# Patient Record
Sex: Female | Born: 1960 | Race: White | Hispanic: No | Smoking: Former smoker
Health system: Southern US, Community
[De-identification: ages and names within clinical notes are randomized; demographics above are authoritative.]

## PROBLEM LIST (undated history)

## (undated) DIAGNOSIS — I739 Peripheral vascular disease, unspecified: Secondary | ICD-10-CM

## (undated) DIAGNOSIS — S329XXS Fracture of unspecified parts of lumbosacral spine and pelvis, sequela: Secondary | ICD-10-CM

## (undated) DIAGNOSIS — M4696 Unspecified inflammatory spondylopathy, lumbar region: Secondary | ICD-10-CM

## (undated) DIAGNOSIS — Q780 Osteogenesis imperfecta: Secondary | ICD-10-CM

## (undated) DIAGNOSIS — G9341 Metabolic encephalopathy: Secondary | ICD-10-CM

## (undated) DIAGNOSIS — E785 Hyperlipidemia, unspecified: Secondary | ICD-10-CM

## (undated) DIAGNOSIS — I639 Cerebral infarction, unspecified: Secondary | ICD-10-CM

## (undated) DIAGNOSIS — D65 Disseminated intravascular coagulation [defibrination syndrome]: Secondary | ICD-10-CM

## (undated) DIAGNOSIS — F4000 Agoraphobia, unspecified: Secondary | ICD-10-CM

## (undated) DIAGNOSIS — F319 Bipolar disorder, unspecified: Secondary | ICD-10-CM

## (undated) DIAGNOSIS — M810 Age-related osteoporosis without current pathological fracture: Secondary | ICD-10-CM

## (undated) DIAGNOSIS — F609 Personality disorder, unspecified: Secondary | ICD-10-CM

## (undated) DIAGNOSIS — G40909 Epilepsy, unspecified, not intractable, without status epilepticus: Secondary | ICD-10-CM

## (undated) DIAGNOSIS — Z9181 History of falling: Secondary | ICD-10-CM

## (undated) DIAGNOSIS — M51369 Other intervertebral disc degeneration, lumbar region without mention of lumbar back pain or lower extremity pain: Secondary | ICD-10-CM

## (undated) DIAGNOSIS — I10 Essential (primary) hypertension: Secondary | ICD-10-CM

## (undated) DIAGNOSIS — M1990 Unspecified osteoarthritis, unspecified site: Secondary | ICD-10-CM

## (undated) HISTORY — PX: ABDOMINAL HYSTERECTOMY: SHX81

## (undated) HISTORY — DX: Metabolic encephalopathy: G93.41

## (undated) HISTORY — DX: Disseminated intravascular coagulation (defibrination syndrome): D65

## (undated) HISTORY — DX: Agoraphobia, unspecified: F40.00

## (undated) HISTORY — DX: Personality disorder, unspecified: F60.9

## (undated) HISTORY — DX: History of falling: Z91.81

## (undated) HISTORY — DX: Unspecified inflammatory spondylopathy, lumbar region: M46.96

## (undated) HISTORY — DX: Epilepsy, unspecified, not intractable, without status epilepticus: G40.909

## (undated) HISTORY — DX: Fracture of unspecified parts of lumbosacral spine and pelvis, sequela: S32.9XXS

## (undated) HISTORY — DX: Other intervertebral disc degeneration, lumbar region without mention of lumbar back pain or lower extremity pain: M51.369

## (undated) HISTORY — DX: Peripheral vascular disease, unspecified: I73.9

---

## 1990-05-27 DIAGNOSIS — M810 Age-related osteoporosis without current pathological fracture: Secondary | ICD-10-CM | POA: Insufficient documentation

## 2000-09-25 ENCOUNTER — Emergency Department (HOSPITAL_COMMUNITY): Admission: EM | Admit: 2000-09-25 | Discharge: 2000-09-25 | Payer: Self-pay | Admitting: *Deleted

## 2000-12-04 ENCOUNTER — Other Ambulatory Visit: Admission: RE | Admit: 2000-12-04 | Discharge: 2000-12-04 | Payer: Self-pay

## 2001-09-29 ENCOUNTER — Emergency Department (HOSPITAL_COMMUNITY): Admission: EM | Admit: 2001-09-29 | Discharge: 2001-09-29 | Payer: Self-pay | Admitting: Emergency Medicine

## 2001-09-29 ENCOUNTER — Encounter: Payer: Self-pay | Admitting: Emergency Medicine

## 2001-11-18 ENCOUNTER — Emergency Department (HOSPITAL_COMMUNITY): Admission: EM | Admit: 2001-11-18 | Discharge: 2001-11-19 | Payer: Self-pay | Admitting: Emergency Medicine

## 2001-11-19 ENCOUNTER — Encounter: Payer: Self-pay | Admitting: Emergency Medicine

## 2004-03-03 ENCOUNTER — Encounter: Admission: RE | Admit: 2004-03-03 | Discharge: 2004-03-03 | Payer: Self-pay | Admitting: Occupational Medicine

## 2015-09-25 ENCOUNTER — Emergency Department (HOSPITAL_COMMUNITY): Payer: BLUE CROSS/BLUE SHIELD

## 2015-09-25 ENCOUNTER — Encounter (HOSPITAL_COMMUNITY): Payer: Self-pay | Admitting: Emergency Medicine

## 2015-09-25 ENCOUNTER — Inpatient Hospital Stay (HOSPITAL_COMMUNITY)
Admission: EM | Admit: 2015-09-25 | Discharge: 2015-09-29 | DRG: 470 | Disposition: A | Discharge status: Skilled Nursing Facility | Payer: BLUE CROSS/BLUE SHIELD | Attending: Internal Medicine | Admitting: Internal Medicine

## 2015-09-25 DIAGNOSIS — Z885 Allergy status to narcotic agent status: Secondary | ICD-10-CM

## 2015-09-25 DIAGNOSIS — E785 Hyperlipidemia, unspecified: Secondary | ICD-10-CM | POA: Diagnosis present

## 2015-09-25 DIAGNOSIS — Z87311 Personal history of (healed) other pathological fracture: Secondary | ICD-10-CM

## 2015-09-25 DIAGNOSIS — F10229 Alcohol dependence with intoxication, unspecified: Secondary | ICD-10-CM | POA: Diagnosis present

## 2015-09-25 DIAGNOSIS — S72009A Fracture of unspecified part of neck of unspecified femur, initial encounter for closed fracture: Secondary | ICD-10-CM | POA: Diagnosis present

## 2015-09-25 DIAGNOSIS — I10 Essential (primary) hypertension: Secondary | ICD-10-CM

## 2015-09-25 DIAGNOSIS — Z8279 Family history of other congenital malformations, deformations and chromosomal abnormalities: Secondary | ICD-10-CM

## 2015-09-25 DIAGNOSIS — W010XXA Fall on same level from slipping, tripping and stumbling without subsequent striking against object, initial encounter: Secondary | ICD-10-CM | POA: Insufficient documentation

## 2015-09-25 DIAGNOSIS — M62838 Other muscle spasm: Secondary | ICD-10-CM | POA: Diagnosis present

## 2015-09-25 DIAGNOSIS — Y92002 Bathroom of unspecified non-institutional (private) residence single-family (private) house as the place of occurrence of the external cause: Secondary | ICD-10-CM | POA: Diagnosis not present

## 2015-09-25 DIAGNOSIS — S72002A Fracture of unspecified part of neck of left femur, initial encounter for closed fracture: Secondary | ICD-10-CM | POA: Diagnosis present

## 2015-09-25 DIAGNOSIS — Q780 Osteogenesis imperfecta: Secondary | ICD-10-CM

## 2015-09-25 DIAGNOSIS — Z90710 Acquired absence of both cervix and uterus: Secondary | ICD-10-CM

## 2015-09-25 DIAGNOSIS — F17200 Nicotine dependence, unspecified, uncomplicated: Secondary | ICD-10-CM | POA: Diagnosis present

## 2015-09-25 DIAGNOSIS — Z9889 Other specified postprocedural states: Secondary | ICD-10-CM

## 2015-09-25 DIAGNOSIS — M25552 Pain in left hip: Secondary | ICD-10-CM | POA: Diagnosis present

## 2015-09-25 DIAGNOSIS — F17210 Nicotine dependence, cigarettes, uncomplicated: Secondary | ICD-10-CM | POA: Diagnosis present

## 2015-09-25 HISTORY — DX: Essential (primary) hypertension: I10

## 2015-09-25 HISTORY — DX: Hyperlipidemia, unspecified: E78.5

## 2015-09-25 LAB — CBC
HEMATOCRIT: 46.8 % — AB (ref 36.0–46.0)
Hemoglobin: 15.2 g/dL — ABNORMAL HIGH (ref 12.0–15.0)
MCH: 33.4 pg (ref 26.0–34.0)
MCHC: 32.5 g/dL (ref 30.0–36.0)
MCV: 102.9 fL — AB (ref 78.0–100.0)
Platelets: 161 10*3/uL (ref 150–400)
RBC: 4.55 MIL/uL (ref 3.87–5.11)
RDW: 12.3 % (ref 11.5–15.5)
WBC: 7.9 10*3/uL (ref 4.0–10.5)

## 2015-09-25 LAB — BASIC METABOLIC PANEL
Anion gap: 10 (ref 5–15)
BUN: 6 mg/dL (ref 6–20)
CALCIUM: 9 mg/dL (ref 8.9–10.3)
CO2: 24 mmol/L (ref 22–32)
CREATININE: 0.67 mg/dL (ref 0.44–1.00)
Chloride: 106 mmol/L (ref 101–111)
GFR calc Af Amer: >60 mL/min (ref >60)
GFR calc non Af Amer: >60 mL/min (ref >60)
GLUCOSE: 140 mg/dL — AB (ref 65–99)
Potassium: 4 mmol/L (ref 3.5–5.1)
Sodium: 140 mmol/L (ref 135–145)

## 2015-09-25 LAB — RAPID URINE DRUG SCREEN, HOSP PERFORMED
AMPHETAMINES: NOT DETECTED
Barbiturates: NOT DETECTED
Benzodiazepines: NOT DETECTED
COCAINE: NOT DETECTED
OPIATES: NOT DETECTED
TETRAHYDROCANNABINOL: NOT DETECTED

## 2015-09-25 LAB — URINALYSIS, ROUTINE W REFLEX MICROSCOPIC
BILIRUBIN URINE: NEGATIVE
GLUCOSE, UA: NEGATIVE mg/dL
HGB URINE DIPSTICK: NEGATIVE
KETONES UR: NEGATIVE mg/dL
Leukocytes, UA: NEGATIVE
Nitrite: NEGATIVE
PH: 5.5 (ref 5.0–8.0)
Protein, ur: NEGATIVE mg/dL
Specific Gravity, Urine: 1.015 (ref 1.005–1.030)

## 2015-09-25 LAB — PROTIME-INR
INR: 0.95 (ref 0.00–1.49)
Prothrombin Time: 12.9 seconds (ref 11.6–15.2)

## 2015-09-25 LAB — TYPE AND SCREEN
ABO/RH(D): O POS
Antibody Screen: NEGATIVE

## 2015-09-25 LAB — ABO/RH: ABO/RH(D): O POS

## 2015-09-25 LAB — SURGICAL PCR SCREEN
MRSA, PCR: NEGATIVE
STAPHYLOCOCCUS AUREUS: NEGATIVE

## 2015-09-25 LAB — ETHANOL: ALCOHOL ETHYL (B): 54 mg/dL — AB (ref <5)

## 2015-09-25 LAB — MAGNESIUM: Magnesium: 1.5 mg/dL — ABNORMAL LOW (ref 1.7–2.4)

## 2015-09-25 MED ORDER — CHLORHEXIDINE GLUCONATE 4 % EX LIQD
60.0000 mL | Freq: Once | CUTANEOUS | Status: AC
  Administered 2015-09-26: 4 via TOPICAL
  Filled 2015-09-25 (×2): qty 60

## 2015-09-25 MED ORDER — POVIDONE-IODINE 10 % EX SWAB: 2.0000 "application " | Freq: Once | CUTANEOUS | Status: DC

## 2015-09-25 MED ORDER — SODIUM CHLORIDE 0.9 % IV SOLN: 250.0000 mL | INTRAVENOUS | Status: DC | PRN

## 2015-09-25 MED ORDER — THIAMINE HCL 100 MG/ML IJ SOLN: 100.0000 mg | Freq: Every day | INTRAMUSCULAR | Status: DC

## 2015-09-25 MED ORDER — SODIUM CHLORIDE 0.9% FLUSH: 3.0000 mL | INTRAVENOUS | Status: DC | PRN

## 2015-09-25 MED ORDER — KETOROLAC TROMETHAMINE 15 MG/ML IJ SOLN
15.0000 mg | Freq: Four times a day (QID) | INTRAMUSCULAR | Status: DC
  Administered 2015-09-25 – 2015-09-29 (×15): 15 mg via INTRAVENOUS
  Filled 2015-09-25 (×17): qty 1

## 2015-09-25 MED ORDER — CEFAZOLIN SODIUM-DEXTROSE 2-4 GM/100ML-% IV SOLN: 2.0000 g | INTRAVENOUS | Status: DC

## 2015-09-25 MED ORDER — SODIUM CHLORIDE 0.9 % IV SOLN: INTRAVENOUS | Status: DC

## 2015-09-25 MED ORDER — LORAZEPAM 2 MG/ML IJ SOLN
1.0000 mg | Freq: Four times a day (QID) | INTRAMUSCULAR | Status: AC | PRN
  Administered 2015-09-25: 1 mg via INTRAVENOUS
  Filled 2015-09-25: qty 1

## 2015-09-25 MED ORDER — ACETAMINOPHEN 500 MG PO TABS
1000.0000 mg | ORAL_TABLET | Freq: Once | ORAL | Status: AC
  Administered 2015-09-26: 1000 mg via ORAL
  Filled 2015-09-25: qty 2

## 2015-09-25 MED ORDER — ADULT MULTIVITAMIN W/MINERALS CH
1.0000 | ORAL_TABLET | Freq: Every day | ORAL | Status: DC
  Administered 2015-09-27 – 2015-09-29 (×3): 1 via ORAL
  Filled 2015-09-25 (×3): qty 1

## 2015-09-25 MED ORDER — CHLORHEXIDINE GLUCONATE 4 % EX LIQD: 60.0000 mL | Freq: Once | CUTANEOUS | Status: DC

## 2015-09-25 MED ORDER — ACETAMINOPHEN 325 MG PO TABS
650.0000 mg | ORAL_TABLET | Freq: Four times a day (QID) | ORAL | Status: DC | PRN
  Administered 2015-09-25: 650 mg via ORAL
  Filled 2015-09-25: qty 2

## 2015-09-25 MED ORDER — ACETAMINOPHEN 650 MG RE SUPP: 650.0000 mg | Freq: Four times a day (QID) | RECTAL | Status: DC | PRN

## 2015-09-25 MED ORDER — HYDROMORPHONE HCL 1 MG/ML IJ SOLN
1.0000 mg | Freq: Once | INTRAMUSCULAR | Status: AC
  Administered 2015-09-25: 1 mg via INTRAVENOUS
  Filled 2015-09-25: qty 1

## 2015-09-25 MED ORDER — SODIUM CHLORIDE 0.9% FLUSH
3.0000 mL | Freq: Two times a day (BID) | INTRAVENOUS | Status: DC
Start: 2015-09-25 — End: 2015-09-29
  Administered 2015-09-25 – 2015-09-28 (×6): 3 mL via INTRAVENOUS

## 2015-09-25 MED ORDER — METHOCARBAMOL 500 MG PO TABS
500.0000 mg | ORAL_TABLET | Freq: Three times a day (TID) | ORAL | Status: DC | PRN
  Administered 2015-09-25 – 2015-09-28 (×6): 500 mg via ORAL
  Filled 2015-09-25 (×6): qty 1

## 2015-09-25 MED ORDER — DEXTROSE-NACL 5-0.45 % IV SOLN: 100.0000 mL/h | INTRAVENOUS | Status: DC

## 2015-09-25 MED ORDER — FOLIC ACID 1 MG PO TABS
1.0000 mg | ORAL_TABLET | Freq: Every day | ORAL | Status: DC
Start: 2015-09-25 — End: 2015-09-29
  Administered 2015-09-27 – 2015-09-29 (×3): 1 mg via ORAL
  Filled 2015-09-25 (×3): qty 1

## 2015-09-25 MED ORDER — CEFAZOLIN SODIUM-DEXTROSE 2-4 GM/100ML-% IV SOLN
2.0000 g | INTRAVENOUS | Status: DC
  Filled 2015-09-25: qty 100

## 2015-09-25 MED ORDER — TRANEXAMIC ACID 1000 MG/10ML IV SOLN
1000.0000 mg | INTRAVENOUS | Status: AC
  Administered 2015-09-26: 1000 mg via INTRAVENOUS
  Filled 2015-09-25 (×2): qty 10

## 2015-09-25 MED ORDER — VITAMIN B-1 100 MG PO TABS
100.0000 mg | ORAL_TABLET | Freq: Every day | ORAL | Status: DC
  Administered 2015-09-27 – 2015-09-29 (×3): 100 mg via ORAL
  Filled 2015-09-25 (×3): qty 1

## 2015-09-25 MED ORDER — POVIDONE-IODINE 10 % EX SWAB
2.0000 "application " | Freq: Once | CUTANEOUS | Status: AC
  Administered 2015-09-26: 2 via TOPICAL

## 2015-09-25 MED ORDER — MORPHINE SULFATE (PF) 2 MG/ML IV SOLN
1.0000 mg | INTRAVENOUS | Status: DC | PRN
  Administered 2015-09-25 – 2015-09-29 (×14): 1 mg via INTRAVENOUS
  Filled 2015-09-25 (×14): qty 1

## 2015-09-25 MED ORDER — SODIUM CHLORIDE 0.9 % IV SOLN
INTRAVENOUS | Status: DC
  Administered 2015-09-25: 12:00:00 via INTRAVENOUS

## 2015-09-25 MED ORDER — ACETAMINOPHEN 500 MG PO TABS: 1000.0000 mg | ORAL_TABLET | Freq: Once | ORAL | Status: DC

## 2015-09-25 MED ORDER — PNEUMOCOCCAL VAC POLYVALENT 25 MCG/0.5ML IJ INJ: 0.5000 mL | INJECTION | INTRAMUSCULAR | Status: DC

## 2015-09-25 MED ORDER — HYDROCODONE-ACETAMINOPHEN 5-325 MG PO TABS
1.0000 | ORAL_TABLET | ORAL | Status: DC | PRN
  Administered 2015-09-25 – 2015-09-29 (×14): 2 via ORAL
  Filled 2015-09-25 (×13): qty 2

## 2015-09-25 MED ORDER — LORAZEPAM 1 MG PO TABS
1.0000 mg | ORAL_TABLET | Freq: Four times a day (QID) | ORAL | Status: AC | PRN
  Administered 2015-09-25: 1 mg via ORAL
  Filled 2015-09-25: qty 1

## 2015-09-25 NOTE — H&P (Addendum)
Date: 09/25/2015               Patient Name:  Rose Bridges MRN: [id number]  DOB: 03/11/1961 Age / Sex: 55 y.o., female   PCP: No primary care provider on file.         Medical Service: Internal Medicine Teaching Service         Attending Physician: Dr. Burns Spain, MD    First Contact: Dr. Reubin Milan Pager: [phone]  Second Contact: Dr. 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 Last night around 2 AM, after she had "a drink and a beer", she was in the bathroom when she slipped on a rug and fell on her hip. She immediately felt severe left hip pain. She did not hit her head and had full recollection of the events, but could not get up, so she fell asleep. She still the pain can get up when she woke up this morning, so she was brought in. She denies any dizziness or lightheadedness or other presyncopal symptoms prior to slipping, and denies weakness, numbness, tingling, urinary or bowel incontinence, any other pain besides the left hip and groin pain, or any chest pain, shortness of breath, nausea, vomiting, abdominal pain, or other issues at this time.   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   FHx: Osteogenesis imperfecta in her father,  paternal grandfather, daughter, and granddaughter. No other FH not previously noted.   Meds: Current Facility-Administered Medications  Medication Dose Route Frequency Provider Last Rate Last Dose  . 0.9 %  sodium chloride infusion  250 mL Intravenous PRN Lora Paula, MD      . acetaminophen (TYLENOL) tablet 650 mg  650 mg Oral Q6H PRN Lora Paula, MD       Or  . acetaminophen (TYLENOL) suppository 650 mg  650 mg Rectal Q6H PRN Lora Paula, MD      . morphine 2 MG/ML injection 1 mg  1 mg Intravenous Q2H PRN Lora Paula, MD      . sodium chloride flush (NS) 0.9 % injection 3 mL  3 mL Intravenous Q12H Lora Paula, MD      . sodium chloride flush (NS) 0.9 % injection 3 mL  3 mL Intravenous PRN Lora Paula, MD       No current outpatient prescriptions on file.    Allergies: Allergies as of 09/25/2015 - Review Complete 09/25/2015  Allergen Reaction Noted  . Codeine  09/25/2015   Past Medical History  Diagnosis Date  . Hypertension   . Hyperlipemia    Past Surgical History  Procedure Laterality Date  . Abdominal hysterectomy     No family history on file. Social History   Social History  .  Marital Status: Widowed    Spouse Name: N/A  . Number of Children: N/A  . Years of Education: N/A   Occupational History  . Not on file.   Social History Main Topics  . Smoking status: Current Every Day Smoker -- 1.00 packs/day  . Smokeless tobacco: Not on file  . Alcohol Use: 4.2 oz/week    7 Cans of beer per week  . Drug Use: No  . Sexual Activity: Not on file   Other Topics Concern  . Not on file   Social History Narrative  . No narrative on file    Review of Systems: Pertinent items noted in HPI and remainder of comprehensive ROS otherwise negative.  Physical Exam: Blood pressure 165/82, pulse 93, temperature 98.3 F (36.8 C), temperature source Oral, resp. rate 20, height 5\' 5"  (1.651 m), SpO2 96 %.   Gen: Uncomfortable-appearing, alert  and oriented to person, place, and time. Still appears intoxicated. HEENT: Oropharynx clear without erythema or exudate. Edentulous with lower mandibular prosthesis in place.  Neck: No cervical LAD, no thyromegaly or nodules, no JVD noted. CV: Normal rate, regular rhythm, no murmurs, rubs, or gallops Pulmonary: Normal effort, CTA bilaterally, no crackles or wheezes Abdominal: Soft, non-tender, non-distended, without rebound, guarding, or masses Extremities: Distal pulses 2+ in upper and lower extremities bilaterally, left hip tender to palpation. No obvious deformity noted. Neuro: CN II-XII grossly intact, no focal weakness or sensory deficits noted Skin: No atypical appearing moles. No rashes  Lab results: Basic Metabolic Panel:  Recent Labs  16/01/9604/28/17 0925  NA 140  K 4.0  CL 106  CO2 24  GLUCOSE 140*  BUN 6  CREATININE 0.67  CALCIUM 9.0   CBC:  Recent Labs  09/25/15 0925  WBC 7.9  HGB 15.2*  HCT 46.8*  MCV 102.9*  PLT 161   Coagulation:  Recent Labs  09/25/15 0925  LABPROT 12.9  INR 0.95   Urinalysis:  Recent Labs  09/25/15 0856  COLORURINE YELLOW  LABSPEC 1.015  PHURINE 5.5  GLUCOSEU NEGATIVE  HGBUR NEGATIVE  BILIRUBINUR NEGATIVE  KETONESUR NEGATIVE  PROTEINUR NEGATIVE  NITRITE NEGATIVE  LEUKOCYTESUR NEGATIVE   Imaging results:  Dg Chest 1 View  09/25/2015  CLINICAL DATA:  Preop for left hip fracture. EXAM: CHEST 1 VIEW COMPARISON:  None. FINDINGS: The heart size and mediastinal contours are within normal limits. Both lungs are clear. The visualized skeletal structures are unremarkable. IMPRESSION: No acute cardiopulmonary abnormality seen. Electronically Signed   By: Lupita RaiderJames  Green Jr, M.D.   On: 09/25/2015 09:00   Dg Hip Unilat W Or W/o Pelvis 2-3 Views Left  09/25/2015  CLINICAL DATA:  Severe left hip pain after fall in bathroom last night. Initial encounter. EXAM: DG HIP (WITH OR WITHOUT PELVIS) 2-3V LEFT COMPARISON:  None. FINDINGS: Moderately  displaced and angulated fracture is seen involving the proximal left femoral neck. Right hip appears normal. This fracture appears to be closed and posttraumatic. No significant degenerative changes are noted. IMPRESSION: Moderately displaced and angulated proximal left femoral neck fracture. Electronically Signed   By: Lupita RaiderJames  Green Jr, M.D.   On: 09/25/2015 08:59   Other results: EKG: normal EKG, normal sinus rhythm.  Assessment & Plan by Problem: Active Problems:   Hip fracture (HCC) 1. Moderately displaced left femoral neck fracture 2/2 EtOH and OI - sustained after slipping on a rug in the setting of alcohol intoxication and osteogenesis imperfecta. Neurovascularly intact without other symptoms. Last fracture was a right tib-fib fracture  6 or 7 years ago, but has had several wrist, finger, and toe fractures in the past as well as jaw abnormalities. -Orthopedics on board; appreciate the thoughtful care of this mutual patient -Morphine 1 MG every 2 hours IV when necessary -Ketorolac 15 MG every 6 hours IV scheduled -PT/OT recs -SCDs for ppx now, will adjust pending timing of possible surgery -Patient has been on bisphosphonate in the past, could not tolerate alendronate due to GI side effects. We'll consider another bisphosphonate versus denosumab and/or vit D+calcium on d/c  2. Alcohol use disorder - minimizes her use, still appears drunk on admission despite last drink tonight before. No history of withdrawal seizures, per patient. -EtOH level -CIWA protocol  3. HTN - says she has been on medicines in the past, but does not follow with a PCP currently and does not take any medicine for now -Consider starting oral agent to continue on discharge -Encouraged tobacco cessation   Dispo: Disposition is deferred at this time, awaiting improvement of current medical problems. Anticipated discharge in approximately 1-3 day(s).   The patient does not have a current PCP (No primary care provider  on file.) and does need an [facility] hospital follow-up appointment after discharge.  The patient does not have transportation limitations that hinder transportation to clinic appointments.  Signed: Darrick Huntsman, MD 09/25/2015, 10:20 AM

## 2015-09-25 NOTE — ED Notes (Signed)
Patient transported to X-ray 

## 2015-09-25 NOTE — ED Notes (Signed)
Pt arrives from home via [facility] reporting L hip/leg pain s/p fall.  Pt reports slipping on rug in bathroom, denies LOC, hitting head.  No shortening, rotation, deformity noted.

## 2015-09-25 NOTE — ED Provider Notes (Signed)
CSN: [id number]     Arrival date & time 09/25/15  0806 History   First MD Initiated Contact with Patient 09/25/15 [phone]     Chief Complaint  Patient presents with  . Hip Pain     (Consider location/radiation/quality/duration/timing/severity/associated sxs/prior Treatment) Patient is a 55 y.o. female presenting with hip pain. The history is provided by the patient.  Hip Pain Pertinent negatives include no chest pain, no abdominal pain, no headaches and no shortness of breath.  Patient c/o left hip pain s/p fall last night at home.  States slipped on rug in bathroom. No faintness or dizziness prior to fall. Patient unsure exactly how she twisted or landed. Denies head injury or headache. No neck or back pain. Left hip/groin area pain at rest minimal however with movement left hip severe, non radiating. No associated numbness/weakness. No radicular pain. Skin intact. Denies any other pain or injury.  EMS gave fentanyl w improvement in symptoms.       Past Medical History  Diagnosis Date  . Hypertension   . Hyperlipemia    Past Surgical History  Procedure Laterality Date  . Abdominal hysterectomy     No family history on file. Social History  Substance Use Topics  . Smoking status: Current Every Day Smoker -- 1.00 packs/day  . Smokeless tobacco: None  . Alcohol Use: 4.2 oz/week    7 Cans of beer per week   OB History    No data available     Review of Systems  Constitutional: Negative for fever and chills.  HENT: Negative for nosebleeds.   Eyes: Negative for pain.  Respiratory: Negative for shortness of breath.   Cardiovascular: Negative for chest pain.  Gastrointestinal: Negative for nausea, vomiting and abdominal pain.  Genitourinary: Negative for flank pain.  Musculoskeletal: Negative for back pain and neck pain.  Skin: Negative for wound.  Neurological: Negative for numbness and headaches.  Hematological: Does not bruise/bleed easily.  Psychiatric/Behavioral:  Negative for confusion.      Allergies  Codeine  Home Medications   Prior to Admission medications   Not on File   BP 165/82 mmHg  Pulse 94  Temp(Src) 98.3 F (36.8 C) (Oral)  Resp 18  Ht  (1.651 m)  SpO2 94% Physical Exam  Constitutional: She appears well-developed and well-nourished. No distress.  HENT:  Head: Atraumatic.  Eyes: Conjunctivae are normal. Pupils are equal, round, and reactive to light. No scleral icterus.  Neck: Normal range of motion. Neck supple. No tracheal deviation present.  Cardiovascular: Normal rate, regular rhythm, normal heart sounds and intact distal pulses.   Pulmonary/Chest: Effort normal and breath sounds normal. No respiratory distress. She exhibits no tenderness.  Abdominal: Soft. Normal appearance. She exhibits no distension. There is no tenderness.  Musculoskeletal: She exhibits no edema.  No gross deformity,or rotation of LLE noted, ?minimal shortening. No sts or skin changes noted, skin intact. With rom left hip, c/o severe left hip pain. No pain w rom at knee or ankle. Distal pulses palp.  CTLS spine, non tender, aligned, no step off.   Neurological: She is alert.  Motor intact bil ext, stre 5/5. sens grossly intact.   Skin: Skin is warm and dry. No rash noted.  Psychiatric:  Anxious.   Nursing note and vitals reviewed.   ED Course  Procedures (including critical care time)  Results for orders placed or performed during the hospital encounter of 09/25/15  CBC  Result Value Ref Range   WBC 7.9 4.0 -  10.5 K/uL   RBC 4.55 3.87 - 5.11 MIL/uL   Hemoglobin 15.2 (H) 12.0 - 15.0 g/dL   HCT 16.146.8 (H) 09.636.0 - 04.546.0 %   MCV 102.9 (H) 78.0 - 100.0 fL   MCH 33.4 26.0 - 34.0 pg   MCHC 32.5 30.0 - 36.0 g/dL   RDW 40.912.3 81.111.5 - 91.415.5 %   Platelets 161 150 - 400 K/uL  Urinalysis, Routine w reflex microscopic (not at [facility])  Result Value Ref Range   Color, Urine YELLOW YELLOW   APPearance CLEAR CLEAR   Specific Gravity, Urine 1.015 1.005  - 1.030   pH 5.5 5.0 - 8.0   Glucose, UA NEGATIVE NEGATIVE mg/dL   Hgb urine dipstick NEGATIVE NEGATIVE   Bilirubin Urine NEGATIVE NEGATIVE   Ketones, ur NEGATIVE NEGATIVE mg/dL   Protein, ur NEGATIVE NEGATIVE mg/dL   Nitrite NEGATIVE NEGATIVE   Leukocytes, UA NEGATIVE NEGATIVE  Protime-INR  Result Value Ref Range   Prothrombin Time 12.9 11.6 - 15.2 seconds   INR 0.95 0.00 - 1.49  Type and screen  Result Value Ref Range   ABO/RH(D) PENDING    Antibody Screen PENDING    Sample Expiration 09/28/2015    Dg Chest 1 View  09/25/2015  CLINICAL DATA:  Preop for left hip fracture. EXAM: CHEST 1 VIEW COMPARISON:  None. FINDINGS: The heart size and mediastinal contours are within normal limits. Both lungs are clear. The visualized skeletal structures are unremarkable. IMPRESSION: No acute cardiopulmonary abnormality seen. Electronically Signed   By: Lupita RaiderJames  Green Jr, M.D.   On: 09/25/2015 09:00   Dg Hip Unilat W Or W/o Pelvis 2-3 Views Left  09/25/2015  CLINICAL DATA:  Severe left hip pain after fall in bathroom last night. Initial encounter. EXAM: DG HIP (WITH OR WITHOUT PELVIS) 2-3V LEFT COMPARISON:  None. FINDINGS: Moderately displaced and angulated fracture is seen involving the proximal left femoral neck. Right hip appears normal. This fracture appears to be closed and posttraumatic. No significant degenerative changes are noted. IMPRESSION: Moderately displaced and angulated proximal left femoral neck fracture. Electronically Signed   By: Lupita RaiderJames  Green Jr, M.D.   On: 09/25/2015 08:59      I have personally reviewed and evaluated these images as part of my medical decision-making.   EKG Interpretation   Date/Time:  Sunday Sep 25 2015 09:31:00 EDT Ventricular Rate:  88 PR Interval:  130 QRS Duration: 97 QT Interval:  363 QTC Calculation: 439 R Axis:   84 Text Interpretation:  Sinus rhythm No previous tracing Confirmed by Denton LankSTEINL   MD, Caryn BeeKEVIN ([id number]) on 09/25/2015 9:39:14 AM      MDM      Iv ns. Patient just received fentanyl 150 mcg by EMS.  Xrays.  Hip fracture on xray.  Pain returns. Dilaudid 1 mg iv.  Labs, ecg added to workup.  Orthopedist consulted for hip fracture - discussed with Dr Wandra Feinstein Murphy, will see.  Medicine Service consulted for admission - discussed with int med resident - indicates they will see in ED/admit.   Recheck pain improved. Distal pulses palp.      Cathren LaineKevin Ancelmo Hunt, MD 09/25/15 [phone]

## 2015-09-25 NOTE — Consult Note (Signed)
Reason for Consult:left hip fracture Referring Physician: Gwenlyn Fudge, MD  Rose Bridges is an 55 y.o. female.  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 Last night around 2 AM, after she had "a drink and a beer", she was in the bathroom when she slipped on a rug and fell on her hip. She immediately felt severe left hip pain. She did not hit her head and had full recollection of the events, but could not get up, so she fell asleep. She still the pain can get up when she woke up this morning, so she was brought in. She denies any dizziness or lightheadedness or other presyncopal symptoms prior to slipping, and denies weakness, numbness, tingling, urinary or bowel incontinence, any other pain besides the left hip and groin pain, or any chest pain, shortness of breath, nausea, vomiting, abdominal pain, or other issues at this time. 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   Past Medical History  Diagnosis Date  . Hypertension   . Hyperlipemia     Past Surgical History  Procedure Laterality Date  . Abdominal hysterectomy      No family history on file.  Sister in law does mention significant family for dvt in mother, grandmother, and younger sister  Social History:  reports that she has been smoking.  She does not have any smokeless tobacco history on file. She reports that she drinks about 4.2 oz of alcohol per week. She reports that she does not use illicit  drugs.  Allergies:  Allergies  Allergen Reactions  . Codeine     "migraines"     Medications: I have reviewed the patient's current medications.  Results for orders placed or performed during the hospital encounter of 09/25/15 (from the past 48 hour(s))  Urinalysis, Routine w reflex microscopic (not at [facility])     Status: None   Collection Time: 09/25/15  8:56 AM  Result Value Ref Range   Color, Urine YELLOW YELLOW   APPearance CLEAR CLEAR   Specific Gravity, Urine 1.015 1.005 - 1.030   pH 5.5 5.0 - 8.0   Glucose, UA NEGATIVE NEGATIVE mg/dL   Hgb urine dipstick NEGATIVE NEGATIVE   Bilirubin Urine NEGATIVE NEGATIVE   Ketones, ur NEGATIVE NEGATIVE mg/dL   Protein, ur NEGATIVE NEGATIVE mg/dL   Nitrite NEGATIVE NEGATIVE   Leukocytes, UA NEGATIVE NEGATIVE    Comment: MICROSCOPIC NOT DONE ON URINES WITH NEGATIVE PROTEIN, BLOOD, LEUKOCYTES, NITRITE, OR GLUCOSE <1000 mg/dL.  Urine rapid drug screen (hosp performed)     Status: None   Collection Time: 09/25/15  8:56 AM  Result Value Ref Range   Opiates NONE DETECTED NONE DETECTED   Cocaine NONE DETECTED NONE DETECTED   Benzodiazepines NONE DETECTED NONE DETECTED   Amphetamines NONE DETECTED NONE DETECTED   Tetrahydrocannabinol NONE DETECTED NONE DETECTED   Barbiturates NONE DETECTED NONE DETECTED    Comment:        DRUG SCREEN FOR MEDICAL PURPOSES ONLY.  IF CONFIRMATION IS  NEEDED FOR ANY PURPOSE, NOTIFY LAB WITHIN 5 DAYS.        LOWEST DETECTABLE LIMITS FOR URINE DRUG SCREEN Drug Class       Cutoff (ng/mL) Amphetamine      1000 Barbiturate      200 Benzodiazepine   725 Tricyclics       366 Opiates          300 Cocaine          300 THC              50   CBC     Status: Abnormal   Collection Time: 09/25/15  9:25 AM  Result Value Ref Range   WBC 7.9 4.0 - 10.5 K/uL   RBC 4.55 3.87 - 5.11 MIL/uL   Hemoglobin 15.2 (H) 12.0 - 15.0 g/dL   HCT 46.8 (H) 36.0 - 46.0 %   MCV 102.9 (H) 78.0 - 100.0 fL   MCH 33.4 26.0 - 34.0 pg    MCHC 32.5 30.0 - 36.0 g/dL   RDW 12.3 11.5 - 15.5 %   Platelets 161 150 - 400 K/uL  Basic metabolic panel     Status: Abnormal   Collection Time: 09/25/15  9:25 AM  Result Value Ref Range   Sodium 140 135 - 145 mmol/L   Potassium 4.0 3.5 - 5.1 mmol/L   Chloride 106 101 - 111 mmol/L   CO2 24 22 - 32 mmol/L   Glucose, Bld 140 (H) 65 - 99 mg/dL   BUN 6 6 - 20 mg/dL   Creatinine, Ser 0.67 0.44 - 1.00 mg/dL   Calcium 9.0 8.9 - 10.3 mg/dL   GFR calc non Af Amer >60 >60 mL/min   GFR calc Af Amer >60 >60 mL/min    Comment: (NOTE) The eGFR has been calculated using the CKD EPI equation. This calculation has not been validated in all clinical situations. eGFR's persistently <60 mL/min signify possible Chronic Kidney Disease.    Anion gap 10 5 - 15  Type and screen     Status: None   Collection Time: 09/25/15  9:25 AM  Result Value Ref Range   ABO/RH(D) O POS    Antibody Screen NEG    Sample Expiration 09/28/2015   Protime-INR     Status: None   Collection Time: 09/25/15  9:25 AM  Result Value Ref Range   Prothrombin Time 12.9 11.6 - 15.2 seconds   INR 0.95 0.00 - 1.49  ABO/Rh     Status: None   Collection Time: 09/25/15  9:35 AM  Result Value Ref Range   ABO/RH(D) O POS   Ethanol     Status: Abnormal   Collection Time: 09/25/15 10:00 AM  Result Value Ref Range   Alcohol, Ethyl (B) 54 (H) <5 mg/dL    Comment:        LOWEST DETECTABLE LIMIT FOR SERUM ALCOHOL IS 5 mg/dL FOR MEDICAL PURPOSES ONLY   Magnesium     Status: Abnormal   Collection Time: 09/25/15 10:00 AM  Result Value Ref Range   Magnesium 1.5 (L) 1.7 - 2.4 mg/dL    Dg Chest 1 View  09/25/2015  CLINICAL DATA:  Preop for left hip fracture. EXAM: CHEST 1 VIEW COMPARISON:  None. FINDINGS: The heart size and mediastinal contours are within normal limits. Both lungs are clear. The visualized skeletal structures are unremarkable. IMPRESSION: No acute cardiopulmonary abnormality seen. Electronically Signed   By: Marijo Conception, M.D.   On:  09/25/2015 09:00   Dg Hip Unilat W Or W/o Pelvis 2-3 Views Left  09/25/2015  CLINICAL DATA:  Severe left hip pain after fall in bathroom last night. Initial encounter. EXAM: DG HIP (WITH OR WITHOUT PELVIS) 2-3V LEFT COMPARISON:  None. FINDINGS: Moderately displaced and angulated fracture is seen involving the proximal left femoral neck. Right hip appears normal. This fracture appears to be closed and posttraumatic. No significant degenerative changes are noted. IMPRESSION: Moderately displaced and angulated proximal left femoral neck fracture. Electronically Signed   By: Marijo Conception, M.D.   On: 09/25/2015 08:59    Review of Systems  Constitutional: Negative for fever, chills and diaphoresis.  Eyes: Negative for blurred vision.  Respiratory: Negative for cough and shortness of breath.   Cardiovascular: Negative for chest pain and palpitations.  Gastrointestinal: Negative for nausea, vomiting and abdominal pain.  Genitourinary: Negative for dysuria, urgency and frequency.  Musculoskeletal: Positive for myalgias, joint pain and falls.  Skin: Negative for itching and rash.  Neurological: Negative for dizziness, tingling, loss of consciousness, weakness and headaches.  Endo/Heme/Allergies: Does not bruise/bleed easily.  Psychiatric/Behavioral: Negative for depression.   Blood pressure 174/75, pulse 96, temperature 98.2 F (36.8 C), temperature source Oral, resp. rate 14, height '5\' 5"'  (1.651 m), SpO2 93 %. Physical Exam  Constitutional: She is oriented to person, place, and time. She appears well-developed and well-nourished.  Thin, obvious discomfort from left hip  HENT:  Head: Normocephalic and atraumatic.  Eyes: Conjunctivae and EOM are normal. Pupils are equal, round, and reactive to light.  Neck: Normal range of motion. Neck supple.  Cardiovascular: Normal rate and intact distal pulses.   Respiratory: Effort normal. No respiratory distress.  GI: Soft. She  exhibits no distension. There is no tenderness.  Musculoskeletal:  LLE- NVI, left hip TTP, severe pain with any movement or touch, no calf TTP, intact DF/PF ankle, wiggles toes, cap refill intact.    Lymphadenopathy:    She has no cervical adenopathy.  Neurological: She is alert and oriented to person, place, and time.  Skin: Skin is warm and dry. No rash noted. No erythema.  Psychiatric: She has a normal mood and affect. Her behavior is normal.    Assessment/Plan: Left femoral neck fracture  Discussed risks and benefits of left hip hemiarthroplasty and she wishes to proceed.  Will plan for this tomorrow morning with Dr. Edmonia Lynch so NPO after midnight tonight.  Pain control as ordered.  Significant family hx dvt need good prophylaxis post op.    Chriss Czar 09/25/2015, 12:28 PM

## 2015-09-25 NOTE — ED Notes (Signed)
Dr. Kyung RuddKennedy MD at bedside.

## 2015-09-25 NOTE — ED Notes (Signed)
Pt stated that while being in/out catheterized, a small amount of urine got on the sheets. When asked if we could try to roll her to change out the sheets, she adamantly refused and stated "I don't care, I don't want to be moved".

## 2015-09-25 NOTE — ED Notes (Signed)
Dr. Steinl MD at bedside. 

## 2015-09-25 NOTE — Anesthesia Preprocedure Evaluation (Addendum)
Anesthesia Evaluation  Patient identified by MRN, date of birth, ID band Patient awake    Reviewed: Allergy & Precautions, NPO status , Patient's Chart, lab work & pertinent test results  History of Anesthesia Complications Negative for: history of anesthetic complications  Airway Mallampati: I  TM Distance: >3 FB Neck ROM: Full    Dental  (+) Implants, Dental Advisory Given   Pulmonary Current Smoker,    breath sounds clear to auscultation       Cardiovascular hypertension (no meds),  Rhythm:Regular Rate:Normal     Neuro/Psych negative neurological ROS     GI/Hepatic negative GI ROS, Neg liver ROS,   Endo/Other  negative endocrine ROS  Renal/GU negative Renal ROS     Musculoskeletal   Abdominal   Peds  Hematology negative hematology ROS (+)   Anesthesia Other Findings   Reproductive/Obstetrics                          Anesthesia Physical Anesthesia Plan  ASA: II  Anesthesia Plan: General   Post-op Pain Management:    Induction: Intravenous  Airway Management Planned: Oral ETT  Additional Equipment:   Intra-op Plan:   Post-operative Plan: Extubation in OR  Informed Consent: I have reviewed the patients History and Physical, chart, labs and discussed the procedure including the risks, benefits and alternatives for the proposed anesthesia with the patient or authorized representative who has indicated his/her understanding and acceptance.   Dental advisory given  Plan Discussed with: CRNA and Surgeon  Anesthesia Plan Comments: (Plan routine monitors, GETA)        Anesthesia Quick Evaluation

## 2015-09-26 ENCOUNTER — Encounter (HOSPITAL_COMMUNITY): Payer: Self-pay | Admitting: Certified Registered Nurse Anesthetist

## 2015-09-26 ENCOUNTER — Inpatient Hospital Stay (HOSPITAL_COMMUNITY): Payer: BLUE CROSS/BLUE SHIELD | Admitting: Certified Registered Nurse Anesthetist

## 2015-09-26 ENCOUNTER — Inpatient Hospital Stay (HOSPITAL_COMMUNITY): Payer: BLUE CROSS/BLUE SHIELD

## 2015-09-26 ENCOUNTER — Encounter (HOSPITAL_COMMUNITY)
Admission: EM | Disposition: A | Discharge status: Skilled Nursing Facility | Payer: Self-pay | Source: Home / Self Care | Attending: Internal Medicine

## 2015-09-26 DIAGNOSIS — S72002D Fracture of unspecified part of neck of left femur, subsequent encounter for closed fracture with routine healing: Secondary | ICD-10-CM

## 2015-09-26 DIAGNOSIS — Z96642 Presence of left artificial hip joint: Secondary | ICD-10-CM

## 2015-09-26 HISTORY — PX: HIP ARTHROPLASTY: SHX981

## 2015-09-26 LAB — CBC
HEMATOCRIT: 44.3 % (ref 36.0–46.0)
HEMOGLOBIN: 14 g/dL (ref 12.0–15.0)
MCH: 33.2 pg (ref 26.0–34.0)
MCHC: 31.6 g/dL (ref 30.0–36.0)
MCV: 105 fL — AB (ref 78.0–100.0)
Platelets: 127 10*3/uL — ABNORMAL LOW (ref 150–400)
RBC: 4.22 MIL/uL (ref 3.87–5.11)
RDW: 12.7 % (ref 11.5–15.5)
WBC: 5.6 10*3/uL (ref 4.0–10.5)

## 2015-09-26 SURGERY — HEMIARTHROPLASTY, HIP, DIRECT ANTERIOR APPROACH, FOR FRACTURE
Anesthesia: General | Site: Hip | Laterality: Left

## 2015-09-26 MED ORDER — METOCLOPRAMIDE HCL 5 MG PO TABS
5.0000 mg | ORAL_TABLET | Freq: Three times a day (TID) | ORAL | Status: DC | PRN
  Filled 2015-09-26: qty 2

## 2015-09-26 MED ORDER — MIDAZOLAM HCL 2 MG/2ML IJ SOLN
INTRAMUSCULAR | Status: AC
  Filled 2015-09-26: qty 2

## 2015-09-26 MED ORDER — HYDROMORPHONE HCL 1 MG/ML IJ SOLN
INTRAMUSCULAR | Status: AC
  Administered 2015-09-26: 0.5 mg via INTRAVENOUS
  Filled 2015-09-26: qty 1

## 2015-09-26 MED ORDER — HYDROMORPHONE HCL 1 MG/ML IJ SOLN
0.2500 mg | INTRAMUSCULAR | Status: DC | PRN
  Administered 2015-09-26 (×2): 0.5 mg via INTRAVENOUS

## 2015-09-26 MED ORDER — PROPOFOL 10 MG/ML IV BOLUS
INTRAVENOUS | Status: DC | PRN
  Administered 2015-09-26: 120 mg via INTRAVENOUS

## 2015-09-26 MED ORDER — MAGNESIUM SULFATE 2 GM/50ML IV SOLN
2.0000 g | Freq: Once | INTRAVENOUS | Status: AC
  Administered 2015-09-26: 2 g via INTRAVENOUS
  Filled 2015-09-26: qty 50

## 2015-09-26 MED ORDER — MEPERIDINE HCL 25 MG/ML IJ SOLN: 6.2500 mg | INTRAMUSCULAR | Status: DC | PRN

## 2015-09-26 MED ORDER — FENTANYL CITRATE (PF) 100 MCG/2ML IJ SOLN
INTRAMUSCULAR | Status: DC | PRN
  Administered 2015-09-26 (×4): 50 ug via INTRAVENOUS
  Administered 2015-09-26: 150 ug via INTRAVENOUS
  Administered 2015-09-26: 100 ug via INTRAVENOUS

## 2015-09-26 MED ORDER — CEFAZOLIN SODIUM 1 G IJ SOLR
INTRAMUSCULAR | Status: DC | PRN
  Administered 2015-09-26: 2 g via INTRAMUSCULAR

## 2015-09-26 MED ORDER — SUGAMMADEX SODIUM 200 MG/2ML IV SOLN
INTRAVENOUS | Status: DC | PRN
  Administered 2015-09-26: 137 mg via INTRAVENOUS

## 2015-09-26 MED ORDER — MIDAZOLAM HCL 5 MG/5ML IJ SOLN
INTRAMUSCULAR | Status: DC | PRN
  Administered 2015-09-26 (×2): 1 mg via INTRAVENOUS

## 2015-09-26 MED ORDER — CEFAZOLIN SODIUM-DEXTROSE 2-4 GM/100ML-% IV SOLN
2.0000 g | Freq: Four times a day (QID) | INTRAVENOUS | Status: AC
  Administered 2015-09-26 (×2): 2 g via INTRAVENOUS
  Filled 2015-09-26 (×2): qty 100

## 2015-09-26 MED ORDER — HYDROCODONE-ACETAMINOPHEN 5-325 MG PO TABS: 2.0000 | ORAL_TABLET | ORAL | Status: DC | PRN

## 2015-09-26 MED ORDER — PROPOFOL 10 MG/ML IV BOLUS
INTRAVENOUS | Status: AC
  Filled 2015-09-26: qty 40

## 2015-09-26 MED ORDER — MIDAZOLAM HCL 2 MG/2ML IJ SOLN: 0.5000 mg | Freq: Once | INTRAMUSCULAR | Status: DC | PRN

## 2015-09-26 MED ORDER — 0.9 % SODIUM CHLORIDE (POUR BTL) OPTIME
TOPICAL | Status: DC | PRN
Start: 2015-09-26 — End: 2015-09-26
  Administered 2015-09-26: 1000 mL

## 2015-09-26 MED ORDER — ONDANSETRON HCL 4 MG PO TABS: 4.0000 mg | ORAL_TABLET | Freq: Four times a day (QID) | ORAL | Status: DC | PRN

## 2015-09-26 MED ORDER — DOCUSATE SODIUM 100 MG PO CAPS: 100.0000 mg | ORAL_CAPSULE | Freq: Two times a day (BID) | ORAL | Status: DC

## 2015-09-26 MED ORDER — METOCLOPRAMIDE HCL 5 MG/ML IJ SOLN: 5.0000 mg | Freq: Three times a day (TID) | INTRAMUSCULAR | Status: DC | PRN

## 2015-09-26 MED ORDER — HYDROCODONE-ACETAMINOPHEN 5-325 MG PO TABS
ORAL_TABLET | ORAL | Status: AC
  Filled 2015-09-26: qty 2

## 2015-09-26 MED ORDER — LACTATED RINGERS IV SOLN
INTRAVENOUS | Status: DC | PRN
  Administered 2015-09-26 (×2): via INTRAVENOUS

## 2015-09-26 MED ORDER — ESMOLOL HCL 100 MG/10ML IV SOLN
INTRAVENOUS | Status: DC | PRN
  Administered 2015-09-26: 20 mg via INTRAVENOUS

## 2015-09-26 MED ORDER — FENTANYL CITRATE (PF) 250 MCG/5ML IJ SOLN
INTRAMUSCULAR | Status: AC
  Filled 2015-09-26: qty 5

## 2015-09-26 MED ORDER — MENTHOL 3 MG MT LOZG: 1.0000 | LOZENGE | OROMUCOSAL | Status: DC | PRN

## 2015-09-26 MED ORDER — ASPIRIN EC 325 MG PO TBEC: 325.0000 mg | DELAYED_RELEASE_TABLET | Freq: Every day | ORAL | Status: DC

## 2015-09-26 MED ORDER — PHENOL 1.4 % MT LIQD: 1.0000 | OROMUCOSAL | Status: DC | PRN

## 2015-09-26 MED ORDER — ONDANSETRON HCL 4 MG/2ML IJ SOLN
INTRAMUSCULAR | Status: DC | PRN
  Administered 2015-09-26: 4 mg via INTRAVENOUS

## 2015-09-26 MED ORDER — VECURONIUM BROMIDE 10 MG IV SOLR
INTRAVENOUS | Status: DC | PRN
  Administered 2015-09-26: 2 mg via INTRAVENOUS
  Administered 2015-09-26: 7 mg via INTRAVENOUS

## 2015-09-26 MED ORDER — PHENYLEPHRINE 40 MCG/ML (10ML) SYRINGE FOR IV PUSH (FOR BLOOD PRESSURE SUPPORT)
PREFILLED_SYRINGE | INTRAVENOUS | Status: DC | PRN
  Administered 2015-09-26: 80 ug via INTRAVENOUS
  Administered 2015-09-26 (×2): 40 ug via INTRAVENOUS
  Administered 2015-09-26: 80 ug via INTRAVENOUS

## 2015-09-26 MED ORDER — ONDANSETRON HCL 4 MG/2ML IJ SOLN
4.0000 mg | Freq: Four times a day (QID) | INTRAMUSCULAR | Status: DC | PRN
  Administered 2015-09-26: 4 mg via INTRAVENOUS
  Filled 2015-09-26: qty 2

## 2015-09-26 MED ORDER — ASPIRIN EC 325 MG PO TBEC
325.0000 mg | DELAYED_RELEASE_TABLET | Freq: Every day | ORAL | Status: DC
  Administered 2015-09-27 – 2015-09-29 (×3): 325 mg via ORAL
  Filled 2015-09-26 (×3): qty 1

## 2015-09-26 SURGICAL SUPPLY — 57 items
BIT DRILL 7/64X5 DISP (BIT) ×3 IMPLANT
BLADE SAGITTAL 25.0X1.27X90 (BLADE) ×2 IMPLANT
BLADE SAGITTAL 25.0X1.27X90MM (BLADE) ×1
BNDG COHESIVE 4X5 TAN STRL (GAUZE/BANDAGES/DRESSINGS) ×3 IMPLANT
CAPT HIP HEMI 2 ×3 IMPLANT
CLOSURE STERI-STRIP 1/2X4 (GAUZE/BANDAGES/DRESSINGS) ×2
CLOSURE WOUND 1/2 X4 (GAUZE/BANDAGES/DRESSINGS) ×1
CLSR STERI-STRIP ANTIMIC 1/2X4 (GAUZE/BANDAGES/DRESSINGS) ×4 IMPLANT
COVER SURGICAL LIGHT HANDLE (MISCELLANEOUS) ×3 IMPLANT
DRAPE IMP U-DRAPE 54X76 (DRAPES) IMPLANT
DRAPE ORTHO SPLIT 77X108 STRL (DRAPES) ×6
DRAPE SURG ORHT 6 SPLT 77X108 (DRAPES) ×2 IMPLANT
DRAPE U-SHAPE 47X51 STRL (DRAPES) ×3 IMPLANT
DRSG MEPILEX BORDER 4X8 (GAUZE/BANDAGES/DRESSINGS) ×3 IMPLANT
DURAPREP 26ML APPLICATOR (WOUND CARE) ×3 IMPLANT
ELECT BLADE 4.0 EZ CLEAN MEGAD (MISCELLANEOUS)
ELECT CAUTERY BLADE 6.4 (BLADE) ×3 IMPLANT
ELECT REM PT RETURN 9FT ADLT (ELECTROSURGICAL) ×3
ELECTRODE BLDE 4.0 EZ CLN MEGD (MISCELLANEOUS) IMPLANT
ELECTRODE REM PT RTRN 9FT ADLT (ELECTROSURGICAL) ×1 IMPLANT
FACESHIELD WRAPAROUND (MASK) ×3 IMPLANT
GLOVE BIO SURGEON STRL SZ7 (GLOVE) ×3 IMPLANT
GLOVE BIO SURGEON STRL SZ7.5 (GLOVE) IMPLANT
GLOVE BIOGEL PI IND STRL 7.0 (GLOVE) ×1 IMPLANT
GLOVE BIOGEL PI IND STRL 8 (GLOVE) ×1 IMPLANT
GLOVE BIOGEL PI INDICATOR 7.0 (GLOVE) ×2
GLOVE BIOGEL PI INDICATOR 8 (GLOVE) ×2
GOWN STRL REUS W/ TWL LRG LVL3 (GOWN DISPOSABLE) ×1 IMPLANT
GOWN STRL REUS W/ TWL XL LVL3 (GOWN DISPOSABLE) ×2 IMPLANT
GOWN STRL REUS W/TWL LRG LVL3 (GOWN DISPOSABLE) ×3
GOWN STRL REUS W/TWL XL LVL3 (GOWN DISPOSABLE) ×6
HIP CAPITATED HEMI 2 ×1 IMPLANT
KIT BASIN OR (CUSTOM PROCEDURE TRAY) ×3 IMPLANT
KIT ROOM TURNOVER OR (KITS) ×3 IMPLANT
MANIFOLD NEPTUNE II (INSTRUMENTS) ×3 IMPLANT
NS IRRIG 1000ML POUR BTL (IV SOLUTION) ×3 IMPLANT
PACK TOTAL JOINT (CUSTOM PROCEDURE TRAY) ×3 IMPLANT
PACK UNIVERSAL I (CUSTOM PROCEDURE TRAY) ×3 IMPLANT
PAD ARMBOARD 7.5X6 YLW CONV (MISCELLANEOUS) ×9 IMPLANT
PILLOW ABDUCTION HIP (SOFTGOODS) ×3 IMPLANT
RETRIEVER SUT HEWSON (MISCELLANEOUS) ×3 IMPLANT
STRIP CLOSURE SKIN 1/2X4 (GAUZE/BANDAGES/DRESSINGS) ×2 IMPLANT
SUT FIBERWIRE #2 38 REV NDL BL (SUTURE) ×6
SUT MNCRL AB 4-0 PS2 18 (SUTURE) IMPLANT
SUT MON AB 2-0 CT1 36 (SUTURE) ×3 IMPLANT
SUT VIC AB 1 CT1 27 (SUTURE) ×3
SUT VIC AB 1 CT1 27XBRD ANBCTR (SUTURE) ×1 IMPLANT
SUT VIC AB 2-0 CT1 27 (SUTURE) ×3
SUT VIC AB 2-0 CT1 TAPERPNT 27 (SUTURE) ×1 IMPLANT
SUT VIC AB 4-0 PS2 27 (SUTURE) ×3 IMPLANT
SUTURE FIBERWR#2 38 REV NDL BL (SUTURE) ×2 IMPLANT
TOWEL OR 17X24 6PK STRL BLUE (TOWEL DISPOSABLE) ×3 IMPLANT
TOWEL OR 17X26 10 PK STRL BLUE (TOWEL DISPOSABLE) ×3 IMPLANT
TRAY FOLEY CATH 14FR (SET/KITS/TRAYS/PACK) IMPLANT
TUBE CONNECTING 12'X1/4 (SUCTIONS) ×1
TUBE CONNECTING 12X1/4 (SUCTIONS) ×2 IMPLANT
WATER STERILE IRR 1000ML POUR (IV SOLUTION) IMPLANT

## 2015-09-26 NOTE — H&P (View-Only) (Signed)
Reason for Consult:left hip fracture Referring Physician: Gwenlyn Fudge, MD  Rose Bridges is an 55 y.o. female.  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 Last night around 2 AM, after she had "a drink and a beer", she was in the bathroom when she slipped on a rug and fell on her hip. She immediately felt severe left hip pain. She did not hit her head and had full recollection of the events, but could not get up, so she fell asleep. She still the pain can get up when she woke up this morning, so she was brought in. She denies any dizziness or lightheadedness or other presyncopal symptoms prior to slipping, and denies weakness, numbness, tingling, urinary or bowel incontinence, any other pain besides the left hip and groin pain, or any chest pain, shortness of breath, nausea, vomiting, abdominal pain, or other issues at this time. 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   Past Medical History  Diagnosis Date  . Hypertension   . Hyperlipemia     Past Surgical History  Procedure Laterality Date  . Abdominal hysterectomy      No family history on file.  Sister in law does mention significant family for dvt in mother, grandmother, and younger sister  Social History:  reports that she has been smoking.  She does not have any smokeless tobacco history on file. She reports that she drinks about 4.2 oz of alcohol per week. She reports that she does not use illicit  drugs.  Allergies:  Allergies  Allergen Reactions  . Codeine     "migraines"     Medications: I have reviewed the patient's current medications.  Results for orders placed or performed during the hospital encounter of 09/25/15 (from the past 48 hour(s))  Urinalysis, Routine w reflex microscopic (not at [facility])     Status: None   Collection Time: 09/25/15  8:56 AM  Result Value Ref Range   Color, Urine YELLOW YELLOW   APPearance CLEAR CLEAR   Specific Gravity, Urine 1.015 1.005 - 1.030   pH 5.5 5.0 - 8.0   Glucose, UA NEGATIVE NEGATIVE mg/dL   Hgb urine dipstick NEGATIVE NEGATIVE   Bilirubin Urine NEGATIVE NEGATIVE   Ketones, ur NEGATIVE NEGATIVE mg/dL   Protein, ur NEGATIVE NEGATIVE mg/dL   Nitrite NEGATIVE NEGATIVE   Leukocytes, UA NEGATIVE NEGATIVE    Comment: MICROSCOPIC NOT DONE ON URINES WITH NEGATIVE PROTEIN, BLOOD, LEUKOCYTES, NITRITE, OR GLUCOSE <1000 mg/dL.  Urine rapid drug screen (hosp performed)     Status: None   Collection Time: 09/25/15  8:56 AM  Result Value Ref Range   Opiates NONE DETECTED NONE DETECTED   Cocaine NONE DETECTED NONE DETECTED   Benzodiazepines NONE DETECTED NONE DETECTED   Amphetamines NONE DETECTED NONE DETECTED   Tetrahydrocannabinol NONE DETECTED NONE DETECTED   Barbiturates NONE DETECTED NONE DETECTED    Comment:        DRUG SCREEN FOR MEDICAL PURPOSES ONLY.  IF CONFIRMATION IS  NEEDED FOR ANY PURPOSE, NOTIFY LAB WITHIN 5 DAYS.        LOWEST DETECTABLE LIMITS FOR URINE DRUG SCREEN Drug Class       Cutoff (ng/mL) Amphetamine      1000 Barbiturate      200 Benzodiazepine   585 Tricyclics       277 Opiates          300 Cocaine          300 THC              50   CBC     Status: Abnormal   Collection Time: 09/25/15  9:25 AM  Result Value Ref Range   WBC 7.9 4.0 - 10.5 K/uL   RBC 4.55 3.87 - 5.11 MIL/uL   Hemoglobin 15.2 (H) 12.0 - 15.0 g/dL   HCT 46.8 (H) 36.0 - 46.0 %   MCV 102.9 (H) 78.0 - 100.0 fL   MCH 33.4 26.0 - 34.0 pg    MCHC 32.5 30.0 - 36.0 g/dL   RDW 12.3 11.5 - 15.5 %   Platelets 161 150 - 400 K/uL  Basic metabolic panel     Status: Abnormal   Collection Time: 09/25/15  9:25 AM  Result Value Ref Range   Sodium 140 135 - 145 mmol/L   Potassium 4.0 3.5 - 5.1 mmol/L   Chloride 106 101 - 111 mmol/L   CO2 24 22 - 32 mmol/L   Glucose, Bld 140 (H) 65 - 99 mg/dL   BUN 6 6 - 20 mg/dL   Creatinine, Ser 0.67 0.44 - 1.00 mg/dL   Calcium 9.0 8.9 - 10.3 mg/dL   GFR calc non Af Amer >60 >60 mL/min   GFR calc Af Amer >60 >60 mL/min    Comment: (NOTE) The eGFR has been calculated using the CKD EPI equation. This calculation has not been validated in all clinical situations. eGFR's persistently <60 mL/min signify possible Chronic Kidney Disease.    Anion gap 10 5 - 15  Type and screen     Status: None   Collection Time: 09/25/15  9:25 AM  Result Value Ref Range   ABO/RH(D) O POS    Antibody Screen NEG    Sample Expiration 09/28/2015   Protime-INR     Status: None   Collection Time: 09/25/15  9:25 AM  Result Value Ref Range   Prothrombin Time 12.9 11.6 - 15.2 seconds   INR 0.95 0.00 - 1.49  ABO/Rh     Status: None   Collection Time: 09/25/15  9:35 AM  Result Value Ref Range   ABO/RH(D) O POS   Ethanol     Status: Abnormal   Collection Time: 09/25/15 10:00 AM  Result Value Ref Range   Alcohol, Ethyl (B) 54 (H) <5 mg/dL    Comment:        LOWEST DETECTABLE LIMIT FOR SERUM ALCOHOL IS 5 mg/dL FOR MEDICAL PURPOSES ONLY   Magnesium     Status: Abnormal   Collection Time: 09/25/15 10:00 AM  Result Value Ref Range   Magnesium 1.5 (L) 1.7 - 2.4 mg/dL    Dg Chest 1 View  09/25/2015  CLINICAL DATA:  Preop for left hip fracture. EXAM: CHEST 1 VIEW COMPARISON:  None. FINDINGS: The heart size and mediastinal contours are within normal limits. Both lungs are clear. The visualized skeletal structures are unremarkable. IMPRESSION: No acute cardiopulmonary abnormality seen. Electronically Signed   By: Marijo Conception, M.D.   On:  09/25/2015 09:00   Dg Hip Unilat W Or W/o Pelvis 2-3 Views Left  09/25/2015  CLINICAL DATA:  Severe left hip pain after fall in bathroom last night. Initial encounter. EXAM: DG HIP (WITH OR WITHOUT PELVIS) 2-3V LEFT COMPARISON:  None. FINDINGS: Moderately displaced and angulated fracture is seen involving the proximal left femoral neck. Right hip appears normal. This fracture appears to be closed and posttraumatic. No significant degenerative changes are noted. IMPRESSION: Moderately displaced and angulated proximal left femoral neck fracture. Electronically Signed   By: Marijo Conception, M.D.   On: 09/25/2015 08:59    Review of Systems  Constitutional: Negative for fever, chills and diaphoresis.  Eyes: Negative for blurred vision.  Respiratory: Negative for cough and shortness of breath.   Cardiovascular: Negative for chest pain and palpitations.  Gastrointestinal: Negative for nausea, vomiting and abdominal pain.  Genitourinary: Negative for dysuria, urgency and frequency.  Musculoskeletal: Positive for myalgias, joint pain and falls.  Skin: Negative for itching and rash.  Neurological: Negative for dizziness, tingling, loss of consciousness, weakness and headaches.  Endo/Heme/Allergies: Does not bruise/bleed easily.  Psychiatric/Behavioral: Negative for depression.   Blood pressure 174/75, pulse 96, temperature 98.2 F (36.8 C), temperature source Oral, resp. rate 14, height '5\' 5"'  (1.651 m), SpO2 93 %. Physical Exam  Constitutional: She is oriented to person, place, and time. She appears well-developed and well-nourished.  Thin, obvious discomfort from left hip  HENT:  Head: Normocephalic and atraumatic.  Eyes: Conjunctivae and EOM are normal. Pupils are equal, round, and reactive to light.  Neck: Normal range of motion. Neck supple.  Cardiovascular: Normal rate and intact distal pulses.   Respiratory: Effort normal. No respiratory distress.  GI: Soft. She  exhibits no distension. There is no tenderness.  Musculoskeletal:  LLE- NVI, left hip TTP, severe pain with any movement or touch, no calf TTP, intact DF/PF ankle, wiggles toes, cap refill intact.    Lymphadenopathy:    She has no cervical adenopathy.  Neurological: She is alert and oriented to person, place, and time.  Skin: Skin is warm and dry. No rash noted. No erythema.  Psychiatric: She has a normal mood and affect. Her behavior is normal.    Assessment/Plan: Left femoral neck fracture  Discussed risks and benefits of left hip hemiarthroplasty and she wishes to proceed.  Will plan for this tomorrow morning with Dr. Edmonia Lynch so NPO after midnight tonight.  Pain control as ordered.  Significant family hx dvt need good prophylaxis post op.    Chriss Czar 09/25/2015, 12:28 PM

## 2015-09-26 NOTE — Transfer of Care (Signed)
Immediate Anesthesia Transfer of Care Note  Patient: Rose Bridges  Procedure(s) Performed: Procedure(s): ARTHROPLASTY BIPOLAR HIP (HEMIARTHROPLASTY) (Left)  Patient Location: PACU  Anesthesia Type:General  Level of Consciousness: awake, alert , oriented and patient cooperative  Airway & Oxygen Therapy: Patient Spontanous Breathing and Patient connected to nasal cannula oxygen  Post-op Assessment: Report given to RN, Post -op Vital signs reviewed and stable and Patient moving all extremities X 4  Post vital signs: Reviewed and stable  Last Vitals:  Filed Vitals:   09/26/15 0607 09/26/15 0931  BP: 141/77 134/84  Pulse: 102   Temp: 36.4 C 36.3 C  Resp: 16 14    Last Pain:  Filed Vitals:   09/26/15 0936  PainSc: 4          Complications: No apparent anesthesia complications

## 2015-09-26 NOTE — Progress Notes (Signed)
Subjective: [name] underwent surgery this morning, and is laying comfortably in bed currently. She denies CP, N/V, urinary or bowel issues, and says her hip and neck pain are very well controlled at this time.  Objective: Vital signs in last 24 hours: Filed Vitals:   09/26/15 0607 09/26/15 0931 09/26/15 0945 09/26/15 1000  BP: 141/77 134/84 129/72 114/83  Pulse: 102 105 104 98  Temp: 97.6 F (36.4 C) 97.4 F (36.3 C)    TempSrc: Oral     Resp: 16 14 14 16   Height:      Weight:      SpO2: 95% 100% 97% 100%   Weight change:   Intake/Output Summary (Last 24 hours) at 09/26/15 1231 Last data filed at 09/26/15 [id number]  Gross per 24 hour  Intake   1710 ml  Output    725 ml  Net    985 ml     Gen: Well-appearing, somnolent, but alerts and oriented to person, place, and time CV: Normal rate, regular rhythm, no murmurs, rubs, or gallops Pulmonary: Normal effort, CTA bilaterally, no crackles or wheezes Abdominal: Soft, non-tender, non-distended, without rebound, guarding, or masses Extremities: Distal pulses 2+ in upper and lower extremities bilaterally, no erythema or edema. L hip bandage in place. Neuro: CN II-XII grossly intact, no focal weakness or sensory deficits noted Skin: No atypical appearing moles. No rashes  Lab Results: Basic Metabolic Panel:  Recent Labs Lab 09/25/15 0925 09/25/15 1000  NA 140  --   K 4.0  --   CL 106  --   CO2 24  --   GLUCOSE 140*  --   BUN 6  --   CREATININE 0.67  --   CALCIUM 9.0  --   MG  --  1.5*   CBC:  Recent Labs Lab 09/25/15 0925 09/26/15 0410  WBC 7.9 5.6  HGB 15.2* 14.0  HCT 46.8* 44.3  MCV 102.9* 105.0*  PLT 161 127*   Alcohol Level:  Recent Labs Lab 09/25/15 1000  ETH 54*   Assessment/Plan: 1. Moderately displaced left femoral neck fracture s/p hemiarthoplasty - sustained after slipping on a rug in the setting of alcohol intoxication and osteogenesis imperfecta. Neurovascularly intact without other  symptoms. Last fracture was a right tib-fib fracture 6 or 7 years ago, but has had several wrist, finger, and toe fractures in the past as well as jaw abnormalities. Now s/p hemiarthoplasty on 09/26/15. -Orthopedics on board; appreciate the thoughtful care of this mutual patient -Morphine 1 MG every 2 hours IV when necessary -Ketorolac 15 MG every 6 hours IV scheduled -PT/OT recs - pt agreeable to possible SNF placement -Patient has been on bisphosphonate in the past, could not tolerate alendronate due to GI side effects. We'll consider another bisphosphonate versus denosumab and/or vit D+calcium on d/c  2. Alcohol use disorder - minimizes her use, still appears drunk on admission despite last drink tonight before. No history of withdrawal seizures, per patient. Still blood alcohol positive on admission. -CIWA protocol -Encouraged alcohol cessation  3. HTN - says she has been on medicines in the past, but does not follow with a PCP currently and does not take any medicine for now -Consider starting oral agent to continue on discharge -Encouraged tobacco cessation  Dispo: Disposition is deferred at this time, awaiting improvement of current medical problems.  Anticipated discharge in approximately 1-2 day(s), likely SNF.  The patient does not have a current PCP (No primary care provider on file.) and does  need an [facility] hospital follow-up appointment after discharge.  The patient does have transportation limitations that hinder transportation to clinic appointments.   LOS: 1 day   Darrick Huntsman, MD 09/26/2015, 12:31 PM

## 2015-09-26 NOTE — Interval H&P Note (Signed)
History and Physical Interval Note:  09/26/2015 7:35 AM  Rose Bridges  has presented today for surgery, with the diagnosis of left femoral neck fracture  The various methods of treatment have been discussed with the patient and family. After consideration of risks, benefits and other options for treatment, the patient has consented to  Procedure(s): ARTHROPLASTY BIPOLAR HIP (HEMIARTHROPLASTY) (Left) as a surgical intervention .  The patient's history has been reviewed, patient examined, no change in status, stable for surgery.  I have reviewed the patient's chart and labs.  Questions were answered to the patient's satisfaction.     MURPHY, TIMOTHY D

## 2015-09-26 NOTE — Op Note (Addendum)
09/25/2015 - 09/26/2015  9:22 AM  PATIENT:  Rose Bridges   MRN: [id number]  PRE-OPERATIVE DIAGNOSIS:  left femoral neck fracture  POST-OPERATIVE DIAGNOSIS:  left femoral neck fracture  PROCEDURE:  Procedure(s): ARTHROPLASTY BIPOLAR HIP (HEMIARTHROPLASTY)  PREOPERATIVE INDICATIONS:  Rose Bridges is an 55 y.o. female who was admitted 09/25/2015 with a diagnosis of <principal problem not specified> and elected for surgical management.  The risks benefits and alternatives were discussed with the patient including but not limited to the risks of nonoperative treatment, versus surgical intervention including infection, bleeding, nerve injury, periprosthetic fracture, the need for revision surgery, dislocation, leg length discrepancy, blood clots, cardiopulmonary complications, morbidity, mortality, among others, and they were willing to proceed.  Predicted outcome is good, although there will be at least a six to nine month expected recovery.   OPERATIVE REPORT     SURGEON:  Edmonia Lynch, MD    ASSISTANT:  none    ANESTHESIA:  General    COMPLICATIONS:  None.      COMPONENTS:  [employer]: Femoral stem: 5, Femoral Head:42, Neck:0   PROCEDURE IN DETAIL: The patient was met in the holding area and identified.  The appropriate hip  was marked at the operative site. The patient was then transported to the OR and  placed under general anesthesia.  At that point, the patient was  placed in the lateral decubitus position with the operative side up and  secured to the operating room table and all bony prominences padded.     The operative lower extremity was prepped from the iliac crest to the toes.  Sterile draping was performed.  Time out was performed prior to incision.      A routine posterolateral approach was utilized via sharp dissection  carried down to the subcutaneous tissue.  Gross bleeders were Bovie  coagulated.  The iliotibial band was identified and incised  along the  length of the skin incision.  Self-retaining retractors were  inserted.  With the hip internally rotated, the short external rotators  were identified. The piriformis was tagged with FiberWire, and the hip capsule released in a T-type fashion.  The femoral neck was exposed, and I resected the femoral neck using the appropriate jig. This was performed at approximately a thumb's breadth above the lesser trochanter.    I then exposed the deep acetabulum, cleared out any tissue including the ligamentum teres, and included the hip capsule in the FiberWire used above and below the T.    I then prepared the proximal femur using the cookie-cutter, the lateralizing reamer, and then sequentially broached.  A trial utilized, and I reduced the hip and it was found to have excellent stability with functional range of motion. The trial components were then removed.   The canal and acetabulum were thoroughly irrigated  I inserted the pressfit stem and placed the head and neck collar. The hip was reduced with appropriate force and was stable through a range of motion.   I then used a 2 mm drill bits to pass the FiberWire suture from the capsule and puriform is through the greater trochanter, and secured this. Excellent posterior capsular repair was achieved. I also closed the T in the capsule.  I then irrigated the hip copiously again with pulse lavage, and repaired the fascia with Vicryl, followed by Vicryl for the subcutaneous tissue, Monocryl for the skin, Steri-Strips and sterile gauze. The wounds were injected. The patient was then awakened and returned to PACU in stable and satisfactory  condition. There were no complications.  POST-OP PLAN: Weight bearing as tolerated. DVT px will consist of SCD's and ASA 325  Edmonia Lynch, MD Orthopedic Surgeon [phone]   09/26/2015 9:22 AM   This note was generated using a template and dragon dictation system. In light of that, I have reviewed the note and  all aspects of it are applicable to this case. Any dictation errors are due to the computerized dictation system.

## 2015-09-26 NOTE — Anesthesia Postprocedure Evaluation (Signed)
Anesthesia Post Note  Patient: Vilinda FlakeElizabeth Gimbel  Procedure(s) Performed: Procedure(s) (LRB): ARTHROPLASTY BIPOLAR HIP (HEMIARTHROPLASTY) (Left)  Patient location during evaluation: PACU Anesthesia Type: General Level of consciousness: awake and alert, oriented and patient cooperative Pain management: pain level controlled Vital Signs Assessment: post-procedure vital signs reviewed and stable Respiratory status: spontaneous breathing, nonlabored ventilation, respiratory function stable and patient connected to nasal cannula oxygen Cardiovascular status: blood pressure returned to baseline and stable Postop Assessment: no signs of nausea or vomiting Anesthetic complications: no    Last Vitals:  Filed Vitals:   09/26/15 0931 09/26/15 0945  BP: 134/84 129/72  Pulse: 105 104  Temp: 36.3 C   Resp: 14 14    Last Pain:  Filed Vitals:   09/26/15 0959  PainSc: 6                  Nimrod Wendt,E. Jase Himmelberger

## 2015-09-26 NOTE — Progress Notes (Signed)
  Date: 09/26/2015  Patient name: Rose Bridges  Medical record number: [id number]  Date of birth: 26-Jan-1961   I have seen and evaluated Rose Bridges and discussed their care with the Residency Team. Ms Rose Bridges was seen post-op this AM. She was admitted yesterday after a fall from standing height resulted in L hip pain. She was unable to get up and slept on the bathroom floor. Her mother found her in the AM and called 911. In the ED, plain films showed    Ortho eval pt and performed a L hemiarthroplasty. Currently, she denies CP, dyspnea, nausea, HA although she does have neck pain, she thinks from sleeping on hte floor. She also has L hip pain but it is tolerable. She is tolerating liquids.   She has osteogenesis imperfecta and unfortunately alcoholism. She has had several fx due to both of these and tried an oral bisphosphonate but had GI intolerance. She is listed as having no insurance but she states she does have insurance.   PMHx, Fam Hx, and/or Soc Hx : HTN, osteogenies imperfecta, alcohol use use D/O. She is employed. Fam hx is + for osteogenesis imperfecta variants.   Filed Vitals:   09/26/15 0945 09/26/15 1000  BP: 129/72 114/83  Pulse: 104 98  Temp:    Resp: 14 16  Afebrile Gen : NAD, groggy from anesthesia HEENT : edentulous HRRR no MRG L clear anteriorly ABD + BS, soft Moving toes B  HgB 14 MCV 105 Plts 127  I personally viewed his CXR images and confirmed by reading with the official read. Portable, no abnl  I personally viewed his EKG and confirmed by reading with the official read. Sinus, nl axis, u wave in V4, no ischemic changes  Assessment and Plan: I have seen and evaluated the patient as outlined above. I agree with the formulated Assessment and Plan as detailed in the residents' note, with the following changes:   1. L hip fracture s/p L hemiarthroplasty - per ortho. She is on clears and may weight bear. Pt understands that PT will come today or  tomorrow. She also understands that SNF is likely necessary and is agreeable as long as her insurance covers.   2. Osteogenesis imperfecta with past and current non traumatic fx - she failed oral alendronate. Bisphosphonates are recommended and we would encourage discussion as an outpt once she is able to remain upright which she cannot do currently. Our concerns are that she failed the easiest to obtain med - alendronate - and will now need to move to second line med which will require pre-auth. And her alcoholism puts her at risk of GI side effects / bleeding with any oral bisphosphonate.   3. Alcohol use D/O - CIWA protocol  Burns SpainElizabeth A Jeronica Stlouis, MD 5/29/201711:23 AM

## 2015-09-26 NOTE — Anesthesia Procedure Notes (Signed)
Procedure Name: Intubation Date/Time: 09/26/2015 8:01 AM Performed by: Bobbie StackANDERSON, Jamieon Lannen KIRSTEN Pre-anesthesia Checklist: Patient identified, Emergency Drugs available, Suction available, Patient being monitored and Timeout performed Patient Re-evaluated:Patient Re-evaluated prior to inductionOxygen Delivery Method: Circle system utilized Preoxygenation: Pre-oxygenation with 100% oxygen Intubation Type: IV induction Ventilation: Mask ventilation without difficulty and Two handed mask ventilation required Laryngoscope Size: Miller and 2 Grade View: Grade I Tube type: Oral Tube size: 7.5 mm Number of attempts: 1 Airway Equipment and Method: Stylet Placement Confirmation: ETT inserted through vocal cords under direct vision,  positive ETCO2 and breath sounds checked- equal and bilateral Secured at: 23 cm Tube secured with: Tape Dental Injury: Teeth and Oropharynx as per pre-operative assessment

## 2015-09-27 ENCOUNTER — Encounter (HOSPITAL_COMMUNITY): Payer: Self-pay | Admitting: Orthopedic Surgery

## 2015-09-27 LAB — CBC
HEMATOCRIT: 39.4 % (ref 36.0–46.0)
Hemoglobin: 12.4 g/dL (ref 12.0–15.0)
MCH: 33.1 pg (ref 26.0–34.0)
MCHC: 31.5 g/dL (ref 30.0–36.0)
MCV: 105.1 fL — AB (ref 78.0–100.0)
Platelets: 127 10*3/uL — ABNORMAL LOW (ref 150–400)
RBC: 3.75 MIL/uL — AB (ref 3.87–5.11)
RDW: 12.5 % (ref 11.5–15.5)
WBC: 4.4 10*3/uL (ref 4.0–10.5)

## 2015-09-27 LAB — BASIC METABOLIC PANEL
ANION GAP: 8 (ref 5–15)
BUN: 6 mg/dL (ref 6–20)
CO2: 25 mmol/L (ref 22–32)
Calcium: 7.9 mg/dL — ABNORMAL LOW (ref 8.9–10.3)
Chloride: 102 mmol/L (ref 101–111)
Creatinine, Ser: 0.77 mg/dL (ref 0.44–1.00)
GFR calc non Af Amer: >60 mL/min (ref >60)
GLUCOSE: 103 mg/dL — AB (ref 65–99)
Potassium: 4.1 mmol/L (ref 3.5–5.1)
Sodium: 135 mmol/L (ref 135–145)

## 2015-09-27 LAB — VITAMIN D 25 HYDROXY (VIT D DEFICIENCY, FRACTURES)

## 2015-09-27 LAB — HEMOGLOBIN A1C
HEMOGLOBIN A1C: 5.8 % — AB (ref 4.8–5.6)
Mean Plasma Glucose: 120 mg/dL

## 2015-09-27 LAB — MAGNESIUM: Magnesium: 2 mg/dL (ref 1.7–2.4)

## 2015-09-27 MED ORDER — SODIUM CHLORIDE 0.9% FLUSH: 3.0000 mL | INTRAVENOUS | Status: DC | PRN

## 2015-09-27 MED ORDER — SODIUM CHLORIDE 0.9 % IV SOLN: 250.0000 mL | INTRAVENOUS | Status: DC | PRN

## 2015-09-27 MED ORDER — SODIUM CHLORIDE 0.9% FLUSH
3.0000 mL | Freq: Two times a day (BID) | INTRAVENOUS | Status: DC
  Administered 2015-09-27 – 2015-09-28 (×4): 3 mL via INTRAVENOUS

## 2015-09-27 NOTE — Progress Notes (Signed)
Pains well-controlled therapy is going well  Dressings benign neurovascularly intact   She is doing well at this time Continue physical therapy and work towards placement for dispo when available Posterior hip precautions Weightbearing as tolerated Aspirin 325 for DVT prophylaxis 

## 2015-09-27 NOTE — Progress Notes (Signed)
Subjective: Rose Bridges is POD #2. She had NAEON. This morning, she feels her pain is well controlled. She is ambulating with assistance this morning. She has no other complaints at this time.  Objective: Vital signs in last 24 hours: Filed Vitals:   09/26/15 1500 09/26/15 2118 09/27/15 0124 09/27/15 0639  BP: 141/81 135/70 122/61 128/72  Pulse: 87 93 81 79  Temp: 97.9 F (36.6 C) 97.9 F (36.6 C) 97.4 F (36.3 C) 98.1 F (36.7 C)  TempSrc: Oral Oral Oral Oral  Resp: Height:      Weight:      SpO2: 97% 98% 95% 94%   Weight change:   Intake/Output Summary (Last 24 hours) at 09/27/15 0744 Last data filed at 09/27/15 0546  Gross per 24 hour  Intake   2190 ml  Output   1325 ml  Net    865 ml     Gen: Well-appearing, somnolent, but alerts and oriented to person, place, and time CV: Normal rate, regular rhythm, no murmurs, rubs, or gallops Pulmonary: Normal effort, CTA bilaterally, no crackles or wheezes Abdominal: Soft, non-tender, non-distended, without rebound, guarding, or masses Extremities: Distal pulses 2+ in upper and lower extremities bilaterally, no erythema or edema. L hip bandage in place. Neuro: CN II-XII grossly intact, no focal weakness or sensory deficits noted Skin: No atypical appearing moles. No rashes  Lab Results: Basic Metabolic Panel:  Recent Labs Lab 09/25/15 0925 09/25/15 1000 09/27/15 0332  NA 140  --  135  K 4.0  --  4.1  CL 106  --  102  CO2 24  --  25  GLUCOSE 140*  --  103*  BUN 6  --  6  CREATININE 0.67  --  0.77  CALCIUM 9.0  --  7.9*  MG  --  1.5* 2.0   CBC:  Recent Labs Lab 09/26/15 0410 09/27/15 0332  WBC 5.6 4.4  HGB 14.0 12.4  HCT 44.3 39.4  MCV 105.0* 105.1*  PLT 127* 127*   Alcohol Level:  Recent Labs Lab 09/25/15 1000  ETH 54*   Assessment/Plan: 1. Moderately displaced left femoral neck fracture s/p hemiarthoplasty - sustained after slipping on a rug in the setting of alcohol intoxication and  osteogenesis imperfecta. Neurovascularly intact without other symptoms. Last fracture was a right tib-fib fracture 6 or 7 years ago, but has had several wrist, finger, and toe fractures in the past as well as jaw abnormalities. Now s/p hemiarthoplasty on 09/26/15. -Orthopedics on board; appreciate the thoughtful care of this mutual patient -Ketorolac IV scheduled, Norco PO PRN, Morphine IV for breakthrough -Methocarbamol for muscle spasms -PT/OT recs - pt agreeable to possible SNF placement -Patient has been on bisphosphonate in the past, could not tolerate alendronate due to GI side effects. We'll consider another bisphosphonate versus denosumab and/or vit D+calcium on d/c  2. Alcohol use disorder - minimizes her use, still appears drunk on admission despite last drink tonight before. No history of withdrawal seizures, per patient. Still blood alcohol positive on admission. -CIWA protocol -Encouraged alcohol cessation  3. HTN - says she has been on medicines in the past, but does not follow with a PCP currently and does not take any medicine for now -Consider starting oral agent to continue on discharge -Encouraged tobacco cessation  Dispo: Disposition is deferred at this time, awaiting improvement of current medical problems.  Anticipated discharge to SNF later today or tomorrow/.  The patient does not have a  current PCP (No primary care provider on file.) and [facility] after [facility].  The patient does have transportation limitations that hinder transportation to clinic appointments.   LOS: 2 days   Darrick HuntsmanWilliam R Honore Wipperfurth, MD 09/27/2015, 7:44 AM

## 2015-09-27 NOTE — NC FL2 (Signed)
[place] MEDICAID FL2 LEVEL OF CARE SCREENING TOOL     IDENTIFICATION  Patient Name: Rose Bridges Birthdate: 14-Dec-1960 Sex: female Admission Date (Current Location): 09/25/2015  [facility] [place]   Facility and Address:  [facility], [address], [place], [place] [address]      Provider Number: [id number]  Attending Physician Name and Address:  Burns SpainElizabeth A Butcher, MD  Relative Name and Phone Number:       Current Level of Care: Hospital Recommended Level of Care: Skilled Nursing Facility Prior Approval Number:    Date Approved/Denied:   PASRR Number: [id number]  Discharge Plan: SNF    Current Diagnoses: Patient Active Problem List   Diagnosis Date Noted  . Hip fracture (HCC) 09/25/2015  . Fall from slip, trip, or stumble   . Left displaced femoral neck fracture (HCC)     Orientation RESPIRATION BLADDER Height & Weight     Self, Time, Situation, Place  O2 (1L) Continent Weight: 151 lb 1.6 oz (68.539 kg) Height:  5\' 5"  (165.1 cm)  BEHAVIORAL SYMPTOMS/MOOD NEUROLOGICAL BOWEL NUTRITION STATUS      Continent    AMBULATORY STATUS COMMUNICATION OF NEEDS Skin   Limited Assist Verbally Surgical wounds                       Personal Care Assistance Level of Assistance  Dressing, Bathing Bathing Assistance: Limited assistance   Dressing Assistance: Limited assistance     Functional Limitations Info             SPECIAL CARE FACTORS FREQUENCY  PT (By licensed PT), OT (By licensed OT)     PT Frequency: daily OT Frequency: daily            Contractures Contractures Info: Present    Additional Factors Info  Allergies   Allergies Info: Codeine           Current Medications (09/27/2015):  This is the current hospital active medication list Current Facility-Administered Medications  Medication Dose Route Frequency Provider Last Rate Last Dose  . 0.9 %  sodium chloride infusion  250 mL  Intravenous PRN Lora PaulaJennifer T Krall, MD      . 0.9 %  sodium chloride infusion  250 mL Intravenous PRN Darrick HuntsmanWilliam R Kennedy, MD      . acetaminophen (TYLENOL) tablet 650 mg  650 mg Oral Q6H PRN Lora PaulaJennifer T Krall, MD   650 mg at 09/25/15 2015   Or  . acetaminophen (TYLENOL) suppository 650 mg  650 mg Rectal Q6H PRN Lora PaulaJennifer T Krall, MD      . aspirin EC tablet 325 mg  325 mg Oral Q breakfast Sheral Apleyimothy D Murphy, MD   325 mg at 09/27/15 0919  . folic acid (FOLVITE) tablet 1 mg  1 mg Oral Daily Lora PaulaJennifer T Krall, MD   1 mg at 09/27/15 0919  . HYDROcodone-acetaminophen (NORCO/VICODIN) 5-325 MG per tablet 1-2 tablet  1-2 tablet Oral Q4H PRN Darrick HuntsmanWilliam R Kennedy, MD   2 tablet at 09/27/15 0919  . ketorolac (TORADOL) 15 MG/ML injection 15 mg  15 mg Intravenous Q6H Lora PaulaJennifer T Krall, MD   15 mg at 09/27/15 1237  . LORazepam (ATIVAN) tablet 1 mg  1 mg Oral Q6H PRN Lora PaulaJennifer T Krall, MD   1 mg at 09/25/15 1616   Or  . LORazepam (ATIVAN) injection 1 mg  1 mg Intravenous Q6H PRN Lora PaulaJennifer T Krall, MD   1 mg  at 09/25/15 2015  . menthol-cetylpyridinium (CEPACOL) lozenge 3 mg  1 lozenge Oral PRN Sheral Apley, MD       Or  . phenol (CHLORASEPTIC) mouth spray 1 spray  1 spray Mouth/Throat PRN Sheral Apley, MD      . methocarbamol (ROBAXIN) tablet 500 mg  500 mg Oral Q8H PRN Darrick Huntsman, MD   500 mg at 09/27/15 [phone]  . metoCLOPramide (REGLAN) tablet 5-10 mg  5-10 mg Oral Q8H PRN Sheral Apley, MD       Or  . metoCLOPramide (REGLAN) injection 5-10 mg  5-10 mg Intravenous Q8H PRN Sheral Apley, MD      . morphine 2 MG/ML injection 1 mg  1 mg Intravenous Q2H PRN Lora Paula, MD   1 mg at 09/27/15 1052  . multivitamin with minerals tablet 1 tablet  1 tablet Oral Daily Lora Paula, MD   1 tablet at 09/27/15 0919  . ondansetron (ZOFRAN) tablet 4 mg  4 mg Oral Q6H PRN Sheral Apley, MD       Or  . ondansetron [facility]) injection 4 mg  4 mg Intravenous Q6H PRN Sheral Apley, MD   4 mg at 09/26/15 2017   . pneumococcal 23 valent vaccine (PNU-IMMUNE) injection 0.5 mL  0.5 mL Intramuscular Tomorrow-1000 Burns Spain, MD      . sodium chloride flush (NS) 0.9 % injection 3 mL  3 mL Intravenous Q12H Lora Paula, MD   3 mL at 09/27/15 0920  . sodium chloride flush (NS) 0.9 % injection 3 mL  3 mL Intravenous PRN Lora Paula, MD      . sodium chloride flush (NS) 0.9 % injection 3 mL  3 mL Intravenous Q12H Darrick Huntsman, MD   3 mL at 09/27/15 1045  . sodium chloride flush (NS) 0.9 % injection 3 mL  3 mL Intravenous PRN Darrick Huntsman, MD      . thiamine (VITAMIN B-1) tablet 100 mg  100 mg Oral Daily Lora Paula, MD   100 mg at 09/27/15 0919     Discharge Medications: Please see discharge summary for a list of discharge medications.  Relevant Imaging Results:  Relevant Lab Results:   Additional Information SSN: [id number]  Rondel Baton, LCSW

## 2015-09-27 NOTE — Evaluation (Signed)
Occupational Therapy Evaluation Patient Details Name: Rose Bridges MRN: [id number] DOB: 1961-01-11 Today's Date: 09/27/2015    History of Present Illness Rose Bridges is a 55 year old 30-pack-year current smoker with a variant of osteogenesis imperfecta complicated by many upper and lower extremity fractures in the past, alcohol use disorder, and HTN who presents with left hip pain after a fall last night with resultant left femoral neck fx and now s/p left hip hemiarthplasty   Clinical Impression   This 55 yo female admitted and underwent above presents to acute OT with deficits below (see OT problem list) thus affecting her Independent level pta for basic ADLs and IADLs. 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    Follow Up Recommendations  SNF    Equipment Recommendations  3 in 1 bedside comode       Precautions / Restrictions Precautions Precautions: Fall;Posterior Hip Restrictions Weight Bearing Restrictions: Yes LLE Weight Bearing: Weight bearing as tolerated      Mobility Bed Mobility Overal bed mobility: Needs Assistance Bed Mobility: Supine to Sit     Supine to sit: Min assist;HOB elevated (LLE)        Transfers Overall transfer level: Needs assistance Equipment used: Rolling walker (2 wheeled) Transfers: Sit to/from Stand Sit to Stand: Min guard              Balance Overall balance assessment: Needs assistance Sitting-balance support: No upper extremity supported;Feet supported Sitting balance-Leahy Scale: Good     Standing balance support: Bilateral upper extremity supported;During functional activity Standing balance-Leahy Scale: Poor Standing balance comment: reliant on RW                            ADL Overall ADL's : Needs assistance/impaired Eating/Feeding: Independent;Sitting   Grooming: Min  guard;Standing;Wash/dry hands   Upper Body Bathing: Set up;Sitting   Lower Body Bathing: Maximal assistance (min guard A sit<>stand)   Upper Body Dressing : Set up;Sitting   Lower Body Dressing: Maximal assistance (min guard A sit<>stand)   Toilet Transfer: Minimal assistance;Ambulation;RW;BSC (over toilet)   Toileting- Clothing Manipulation and Hygiene: Min guard;Sit to/from stand                         Pertinent Vitals/Pain Pain Assessment: 0-10 Pain Score: 7  Pain Location: left hip Pain Descriptors / Indicators: Aching;Burning;Sore Pain Intervention(s): Monitored during session;Repositioned     Hand Dominance Right   Extremity/Trunk Assessment Upper Extremity Assessment Upper Extremity Assessment: Overall WFL for tasks assessed   Lower Extremity Assessment Lower Extremity Assessment: Defer to PT evaluation       Communication Communication Communication: No difficulties   Cognition Arousal/Alertness: Awake/alert Behavior During Therapy: WFL for tasks assessed/performed Overall Cognitive Status: Impaired/Different from baseline Area of Impairment: Memory               General Comments: not really trying follow hip precautions (of not bending past 90 degrees) even though I kept reminding her              Home Living Family/patient expects to be discharged to:: Skilled nursing facility                                 Additional Comments:  Mother lives with her and she takes care of mother      Prior Functioning/Environment Level of Independence: Independent        Comments: Has a 3n1, shower seat, hand held shower    OT Diagnosis: Generalized weakness;Acute pain   OT Problem List: Decreased strength;Decreased range of motion;Impaired balance (sitting and/or standing);Pain;Decreased knowledge of precautions;Decreased knowledge of use of DME or AE   OT Treatment/Interventions: Self-care/ADL training;Patient/family  education;Balance training;Therapeutic activities;DME and/or AE instruction    OT Goals(Current goals can be found in the care plan section) Acute Rehab OT Goals Patient Stated Goal: to go home OT Goal Formulation: With patient Time For Goal Achievement: 10/04/15 Potential to Achieve Goals: Good  OT Frequency: Min 2X/week   Barriers to D/C: Decreased caregiver support             End of Session Equipment Utilized During Treatment: Gait belt;Rolling walker  Activity Tolerance: Patient tolerated treatment well Patient left: in bed;with call bell/phone within reach;with bed alarm set   Time: 1131-1157 OT Time Calculation (min): 26 min Charges:  OT General Charges $OT Visit: 1 Procedure OT Evaluation $OT Eval Moderate Complexity: 1 Procedure OT Treatments $Self Care/Home Management : 8-22 mins  Evette Georges [phone] 09/27/2015, 2:33 PM

## 2015-09-27 NOTE — Progress Notes (Signed)
Orthopedic Tech Progress Note Patient Details:  Rose Bridges 10-24-1960 [id number]  Patient ID: Rose Bridges, female   DOB: 10-24-1960, 55 y.o.   MRN: [id number] rn called asking for ohf and said pt was awake. So i put it on bed.   Trinna PostMartinez, Jacolby Risby J 09/27/2015, 1:10 AM

## 2015-09-27 NOTE — Care Management Note (Signed)
Case Management Note  Patient Details  Name: [name] MRN: [id number] Date of Birth: Mar 13, 1961  Subjective/Objective:             Admitted with left hip fracture, s/p left hip hemiarthroplastyt       Action/Plan: PT/OT recommending SNF. Referral made to CSW, CSW working on SNF placement for short term rehab.   Expected Discharge Date:                  Expected Discharge Plan:  Skilled Nursing Facility  In-House Referral:  Clinical Social Work  Discharge planning Services  CM Consult  Post Acute Care Choice:    Choice offered to:     DME Arranged:    DME Agency:     HH Arranged:    HH Agency:     Status of Service:  In process, will continue to follow  Medicare Important Message Given:    Date Medicare IM Given:    Medicare IM give by:    Date Additional Medicare IM Given:    Additional Medicare Important Message give by:     If discussed at Long Length of Stay Meetings, dates discussed:    Additional Comments:  Monica BectonKrieg, Tauna Macfarlane Watson, RN 09/27/2015, 2:59 PM

## 2015-09-27 NOTE — Evaluation (Signed)
Physical Therapy Evaluation Patient Details Name: Rose Bridges MRN: [id number] DOB: July 25, 1960 Today's Date: 09/27/2015   History of Present Illness  Mrs. Garbutt is a 55 year old 30-pack-year current smoker with a variant of osteogenesis imperfecta complicated by many upper and lower extremity fractures in the past, alcohol use disorder, and HTN who presents with left hip pain after a fall last night with resultant left femoral neck fx and now s/p left hip hemiarthplasty  Clinical Impression  Patient is s/p above surgery resulting in functional limitations due to the deficits listed below (see PT Problem List).  Patient will benefit from skilled PT to increase their independence and safety with mobility to allow discharge to SNF when medically stable.        Follow Up Recommendations SNF;Supervision for mobility/OOB    Equipment Recommendations   (to be assessed at next venue)    Recommendations for Other Services       Precautions / Restrictions Precautions Precautions: Fall;Posterior Hip Precaution Booklet Issued: Yes (comment) Precaution Comments: HEP and post. precautions provided. Reviewed precautions with pt.  Restrictions Weight Bearing Restrictions: Yes LLE Weight Bearing: Weight bearing as tolerated      Mobility  Bed Mobility Overal bed mobility: Needs Assistance Bed Mobility: Supine to Sit     Supine to sit: Min assist     General bed mobility comments: assist provided with LLE to come to EOB, cues for sequence needed.   Transfers Overall transfer level: Needs assistance Equipment used: Rolling walker (2 wheeled) Transfers: Sit to/from Stand Sit to Stand: Min guard         General transfer comment: cues for hand position  Ambulation/Gait Ambulation/Gait assistance: Min assist Ambulation Distance (Feet): 5 Feet Assistive device: Rolling walker (2 wheeled) Gait Pattern/deviations: Step-to pattern Gait velocity: decreased   General Gait  Details: cues needed for gait sequence and assist for weight shift and balance.   Stairs            Wheelchair Mobility    Modified Rankin (Stroke Patients Only)       Balance Overall balance assessment: Needs assistance Sitting-balance support: No upper extremity supported Sitting balance-Leahy Scale: Good     Standing balance support: Bilateral upper extremity supported Standing balance-Leahy Scale: Poor Standing balance comment: needing rw for support                             Pertinent Vitals/Pain Pain Assessment: 0-10 Pain Score: 7  Pain Location: LLE Pain Descriptors / Indicators: Aching Pain Intervention(s): Limited activity within patient's tolerance;Monitored during session    Home Living Family/patient expects to be discharged to:: Skilled nursing facility                 Additional Comments: lives with mother who is in her 69s and unable to assist.     Prior Function Level of Independence: Independent              Hand Dominance       Extremity/Trunk Assessment   Upper Extremity Assessment: Defer to OT evaluation           Lower Extremity Assessment: LLE deficits/detail   LLE Deficits / Details: requiring physical assist with moving LLE with bed mobility.      Communication   Communication: No difficulties  Cognition Arousal/Alertness: Awake/alert Behavior During Therapy: WFL for tasks assessed/performed Overall Cognitive Status: Within Functional Limits for tasks assessed Area of Impairment: Memory  General Comments      Exercises        Assessment/Plan    PT Assessment Patient needs continued PT services  PT Diagnosis Difficulty walking   PT Problem List Decreased strength;Decreased range of motion;Decreased activity tolerance;Decreased balance;Decreased mobility  PT Treatment Interventions DME instruction;Gait training;Stair training;Functional mobility  training;Therapeutic activities;Therapeutic exercise;Patient/family education   PT Goals (Current goals can be found in the Care Plan section) Acute Rehab PT Goals Patient Stated Goal: be able to be independent again. PT Goal Formulation: With patient Time For Goal Achievement: 10/11/15 Potential to Achieve Goals: Good    Frequency Min 3X/week   Barriers to discharge Decreased caregiver support      Co-evaluation               End of Session Equipment Utilized During Treatment: Gait belt Activity Tolerance: Patient tolerated treatment well Patient left: in chair;with call bell/phone within reach Nurse Communication: Mobility status;Precautions;Weight bearing status         Time: [id number] PT Time Calculation (min) (ACUTE ONLY): 25 min   Charges:   PT Evaluation $PT Eval Moderate Complexity: 1 Procedure PT Treatments $Therapeutic Activity: 8-22 mins   PT G Codes:        Christiane HaBenjamin J. Keevin Panebianco, PT, CSCS Pager [phone] Office [phone]  09/27/2015, 2:47 PM

## 2015-09-28 DIAGNOSIS — W010XXD Fall on same level from slipping, tripping and stumbling without subsequent striking against object, subsequent encounter: Secondary | ICD-10-CM

## 2015-09-28 DIAGNOSIS — F1020 Alcohol dependence, uncomplicated: Secondary | ICD-10-CM

## 2015-09-28 NOTE — Progress Notes (Signed)
Subjective: [name] is POD #3. She had NAEON. She worked with PT starting yesterday. This morning, her pain is improved. She has no other complaints at this time.  Objective: Vital signs in last 24 hours: Filed Vitals:   09/27/15 1500 09/27/15 1827 09/27/15 2112 09/28/15 0437  BP: 130/68 122/77 125/64 126/76  Pulse: 80 95 88 82  Temp: 98.1 F (36.7 C)  98.9 F (37.2 C) 98.2 F (36.8 C)  TempSrc: Oral  Oral Oral  Resp: 16 20    Height:      Weight:      SpO2: 99% 94% 95% 96%   Weight change:   Intake/Output Summary (Last 24 hours) at 09/28/15 0735 Last data filed at 09/27/15 1700  Gross per 24 hour  Intake    726 ml  Output      0 ml  Net    726 ml     Gen: Well-appearing, somnolent, but alerts and oriented to person, place, and time CV: Normal rate, regular rhythm, no murmurs, rubs, or gallops Pulmonary: Normal effort, CTA bilaterally, no crackles or wheezes Abdominal: Soft, non-tender, non-distended, without rebound, guarding, or masses Extremities: Distal pulses 2+ in upper and lower extremities bilaterally, no erythema or edema. L hip bandage in place. Neuro: CN II-XII grossly intact, no focal weakness or sensory deficits noted Skin: No atypical appearing moles. No rashes  Lab Results: Basic Metabolic Panel:  Recent Labs Lab 09/25/15 0925 09/25/15 1000 09/27/15 0332  NA 140  --  135  K 4.0  --  4.1  CL 106  --  102  CO2 24  --  25  GLUCOSE 140*  --  103*  BUN 6  --  6  CREATININE 0.67  --  0.77  CALCIUM 9.0  --  7.9*  MG  --  1.5* 2.0   CBC:  Recent Labs Lab 09/26/15 0410 09/27/15 0332  WBC 5.6 4.4  HGB 14.0 12.4  HCT 44.3 39.4  MCV 105.0* 105.1*  PLT 127* 127*   Alcohol Level:  Recent Labs Lab 09/25/15 1000  ETH 54*   Assessment/Plan: 1. Moderately displaced left femoral neck fracture s/p hemiarthoplasty - sustained after slipping on a rug in the setting of alcohol intoxication and osteogenesis imperfecta. Neurovascularly intact  without other symptoms. Last fracture was a right tib-fib fracture 6 or 7 years ago, but has had several wrist, finger, and toe fractures in the past as well as jaw abnormalities. Now s/p hemiarthoplasty on 09/26/15. -Orthopedics on board; appreciate the thoughtful care of this mutual patient -Ketorolac IV scheduled, Norco PO PRN, Morphine IV for breakthrough -Methocarbamol for muscle spasms -PT/OT recs. Discussed this with CSW yesterday and patient's insurance ([employer]) require 2 days of inpatient PT evaluation to quality for SNF placement. Will be able to initiate SNF process once PT sees her today -No bisphosphonate on discharge given previous intolerance to alendronate  2. Alcohol use disorder - minimizes her use, still appears drunk on admission despite last drink tonight before. No history of withdrawal seizures, per patient. Still blood alcohol positive on admission. No s/s WD while here. -CIWA protocol -Encouraged alcohol cessation  3. HTN - says she has been on medicines in the past, but does not follow with a PCP currently and does not take any medicine for now -Consider starting oral agent to continue on discharge -Encouraged tobacco cessation  Dispo: Disposition is deferred at this time, awaiting improvement of current medical problems.  Anticipated discharge to SNF tomorrow.  The patient does not have a current PCP (No primary care provider on file.) and does need an [facility] hospital follow-up appointment after discharge.  The patient does have transportation limitations that hinder transportation to clinic appointments.   LOS: 3 days   Darrick Huntsman, MD 09/28/2015, 7:35 AM

## 2015-09-28 NOTE — Progress Notes (Signed)
Occupational Therapy Treatment Patient Details Name: Vilinda Flakelizabeth Chapa MRN: [id number] DOB: [birth year] Today's Date: 09/28/2015    History of present illness Mrs. [name] is a 55 year old 30-pack-year current smoker with a variant of osteogenesis imperfecta complicated by many upper and lower extremity fractures in the past, alcohol use disorder, and HTN who presents with left hip pain after a fall last night with resultant left femoral neck fx and now s/p left hip hemiarthplasty   OT comments  This 55 yo female admitted and underwent above presents to acute OT with making progress with basic ADLs and bed mobility. She will continue to benefit from acute OT with followup OT at SNF.  Follow Up Recommendations  SNF    Equipment Recommendations  3 in 1 bedside comode       Precautions / Restrictions Precautions Precautions: Fall;Posterior Hip Restrictions Weight Bearing Restrictions: Yes LLE Weight Bearing: Weight bearing as tolerated       Mobility Bed Mobility Overal bed mobility: Needs Assistance Bed Mobility: Sit to Supine       Sit to supine: Supervision      Transfers Overall transfer level: Needs assistance Equipment used: Rolling walker (2 wheeled) Transfers: Sit to/from Stand Sit to Stand: Min guard                  ADL Overall ADL's : Needs assistance/impaired                     Lower Body Dressing: Set up;Supervision/safety;With adaptive equipment;Adhering to hip precautions (with minguard A sit<>stand)                                  Cognition   Behavior During Therapy: WFL for tasks assessed/performed Overall Cognitive Status: Within Functional Limits for tasks assessed                  General Comments: more aware of hip precautions today and following them better                 Pertinent Vitals/ Pain       Pain Assessment: Faces Faces Pain Scale: Hurts a little bit Pain Location: left hip Pain  Descriptors / Indicators: Sore Pain Intervention(s): Monitored during session;Repositioned         Frequency Min 2X/week     Progress Toward Goals  OT Goals(current goals can now be found in the care plan section)  Progress towards OT goals: Progressing toward goals     Plan Discharge plan remains appropriate          Activity Tolerance Patient tolerated treatment well   Patient Left in bed;with call bell/phone within reach;with bed alarm set           Time: [id number] OT Time Calculation (min): 18 min  Charges: OT General Charges $OT Visit: 1 Procedure OT Treatments $Self Care/Home Management : 8-22 mins  Evette GeorgesLeonard, Juanya Villavicencio Eva [phone] 09/28/2015, 3:10 PM

## 2015-09-28 NOTE — Progress Notes (Signed)
Pains well-controlled therapy is going well  Dressings benign neurovascularly intact   She is doing well at this time Continue physical therapy and work towards placement for dispo when available Posterior hip precautions Weightbearing as tolerated Aspirin 325 for DVT prophylaxis    Rose Bridges D

## 2015-09-28 NOTE — Progress Notes (Signed)
Physical Therapy Treatment Patient Details Name: Rose Bridges MRN: [id number] DOB: 06-05-1960 Today's Date: 09/28/2015    History of Present Illness Rose Bridges is a 55 year old 30-pack-year current smoker with a variant of osteogenesis imperfecta complicated by many upper and lower extremity fractures in the past, alcohol use disorder, and HTN who presents with left hip pain after a fall last night with resultant left femoral neck fx and now s/p left hip hemiarthplasty    PT Comments    Patient is making progress toward mobility goals. Due to lack of assist at home, continue to recommend SNF for further skilled PT services to maximize independence and safety with mobility.   Follow Up Recommendations  SNF;Supervision for mobility/OOB     Equipment Recommendations   (to be assessed at next venue)    Recommendations for Other Services       Precautions / Restrictions Precautions Precautions: Fall;Posterior Hip Precaution Booklet Issued: Yes (comment) Precaution Comments: HEP and post. precautions provided. Reviewed precautions with pt.  Restrictions Weight Bearing Restrictions: Yes LLE Weight Bearing: Weight bearing as tolerated    Mobility  Bed Mobility Overal bed mobility: Needs Assistance Bed Mobility: Supine to Sit     Supine to sit: Min guard     General bed mobility comments: max cues for maintaining hip precuations and pt a little impulsive when getting out of bed; pt unable to recall hip precuations   Transfers Overall transfer level: Needs assistance Equipment used: Rolling walker (2 wheeled) Transfers: Sit to/from Stand Sit to Stand: Min guard         General transfer comment: cues for hand placement and technique  Ambulation/Gait Ambulation/Gait assistance: Min guard Ambulation Distance (Feet): 60 Feet Assistive device: Rolling walker (2 wheeled) Gait Pattern/deviations: Step-to pattern;Step-through pattern;Decreased weight shift to  left;Decreased stance time - left;Decreased step length - right Gait velocity: decreased   General Gait Details: pt with limited WS to L side and with short guarded steps; max cues for step length and heel strike, position of RW, and encouraged to increase WS to L LE   Stairs            Wheelchair Mobility    Modified Rankin (Stroke Patients Only)       Balance     Sitting balance-Leahy Scale: Good     Standing balance support: Bilateral upper extremity supported Standing balance-Leahy Scale: Fair                      Cognition Arousal/Alertness: Awake/alert Behavior During Therapy: WFL for tasks assessed/performed Overall Cognitive Status: Within Functional Limits for tasks assessed                      Exercises      General Comments General comments (skin integrity, edema, etc.): reviewed hip precautions and discussed HEP       Pertinent Vitals/Pain Pain Assessment: Faces Faces Pain Scale: Hurts little more Pain Location: L hip Pain Descriptors / Indicators: Sore Pain Intervention(s): Limited activity within patient's tolerance;Monitored during session;Premedicated before session;Repositioned    Home Living                      Prior Function            PT Goals (current goals can now be found in the care plan section) Acute Rehab PT Goals Patient Stated Goal: be able to be independent again. PT Goal Formulation: With patient Time For Goal  Achievement: 10/11/15 Potential to Achieve Goals: Good Progress towards PT goals: Progressing toward goals    Frequency  Min 3X/week    PT Plan Current plan remains appropriate    Co-evaluation             End of Session Equipment Utilized During Treatment: Gait belt Activity Tolerance: Patient tolerated treatment well Patient left: with call bell/phone within reach;in bed;Other (comment) (sitting EOB with OT present)     Time: 1610-9604 PT Time Calculation (min) (ACUTE  ONLY): 14 min  Charges:  $Gait Training: 8-22 mins                    G Codes:      Derek Mound, PTA Pager: [phone]   09/28/2015, 11:14 AM

## 2015-09-28 NOTE — Discharge Summary (Deleted)
Name: Rose Bridges MRN: [id number] DOB: May 31, 1960 55 y.o. PCP: No primary care provider on file.  Date of Admission: 09/25/2015  8:06 AM Date of Discharge: 09/28/2015 Attending Physician: Burns Spain, MD  Discharge Diagnosis: 1. Moderately displaced left femoral neck fracture s/p hemiarthoplasty   Active Problems:   Hip fracture (HCC)   Fall from slip, trip, or stumble   Left displaced femoral neck fracture (HCC)  Discharge Medications:   Medication List    TAKE these medications        aspirin EC 325 MG tablet  Take 1 tablet (325 mg total) by mouth daily.     docusate sodium 100 MG capsule  Commonly known as:  COLACE  Take 1 capsule (100 mg total) by mouth 2 (two) times daily. Continue this while taking narcotics to help with bowel movements     HYDROcodone-acetaminophen 5-325 MG tablet  Commonly known as:  NORCO  Take 2 tablets by mouth every 4 (four) hours as needed.        Disposition and follow-up:   Rose Bridges was discharged from [facility] in Good condition.  At the hospital follow up visit please address:  1.  Is patient tolerating PT well? Has she followed up with orthopedics?   2.  Labs / imaging needed at time of follow-up: None  3.  Pending labs/ test needing follow-up: None  Follow-up Appointments:   Discharge Instructions:   Consultations: Treatment Team:  Sheral Apley, MD  Procedures Performed:  Dg Chest 1 View  09/25/2015  CLINICAL DATA:  Preop for left hip fracture. EXAM: CHEST 1 VIEW COMPARISON:  None. FINDINGS: The heart size and mediastinal contours are within normal limits. Both lungs are clear. The visualized skeletal structures are unremarkable. IMPRESSION: No acute cardiopulmonary abnormality seen. Electronically Signed   By: Lupita Raider, M.D.   On: 09/25/2015 09:00   Pelvis Portable  09/26/2015  CLINICAL DATA:  Postop for left hip arthroplasty. EXAM: PORTABLE PELVIS 1-2 VIEWS  COMPARISON:  09/25/2015 FINDINGS: Interval left hip arthroplasty. The distal portion is incompletely imaged, but no hardware complication or periprosthetic fracture is identified. IMPRESSION: Expected appearance after left hip arthroplasty. Distal portion of the arthroplasty not imaged. Electronically Signed   By: Jeronimo Greaves M.D.   On: 09/26/2015 11:03   Dg Hip Unilat W Or W/o Pelvis 2-3 Views Left  09/25/2015  CLINICAL DATA:  Severe left hip pain after fall in bathroom last night. Initial encounter. EXAM: DG HIP (WITH OR WITHOUT PELVIS) 2-3V LEFT COMPARISON:  None. FINDINGS: Moderately displaced and angulated fracture is seen involving the proximal left femoral neck. Right hip appears normal. This fracture appears to be closed and posttraumatic. No significant degenerative changes are noted. IMPRESSION: Moderately displaced and angulated proximal left femoral neck fracture. Electronically Signed   By: Lupita Raider, M.D.   On: 09/25/2015 08:59    2D Echo: None  Cardiac Cath: None  Admission HPI: Rose Bridges is a 55 year old 30-pack-year current smoker with a variant of osteogenesis imperfecta complicated by many upper and lower extremity fractures in the past, alcohol use disorder, and HTN who presents with left hip pain after a fall last night. Last night around 2 AM, after she had "a drink and a beer", she was in the bathroom when she slipped on a rug and fell on her hip. She immediately felt severe left hip pain. She did not hit her head and had full recollection of the events, but  could not get up, so she fell asleep. She still the pain can get up when she woke up this morning, so she was brought in. She denies any dizziness or lightheadedness or other presyncopal symptoms prior to slipping, and denies weakness, numbness, tingling, urinary or bowel incontinence, any other pain besides the left hip and groin pain, or any chest pain, shortness of breath, nausea, vomiting, abdominal pain, or other  issues at this time.   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   FHx: Osteogenesis imperfecta in her father, paternal grandfather, daughter, and granddaughter. No other FH not previously noted.  Hospital Course by problem list: Active Problems:   Hip fracture (HCC)   Fall from slip, trip, or stumble   Left displaced femoral neck fracture (HCC)   1. Moderately displaced left femoral neck fracture s/p hemiarthoplasty - sustained after slipping on a rug in the setting of alcohol intoxication and osteogenesis imperfecta. Neurovascularly intact without other symptoms. She underwent hemiarthoplasty on 09/26/15 without complications. She was put on ASA 325 by orthopedics for DVT prophylaxis. PT/OT recommended SNF placement, and she was discharged to SNF thereafter.  Discharge Vitals:   BP 126/76 mmHg  Pulse 82  Temp(Src) 98.2 F (36.8 C) (Oral)  Resp 20  Ht 5\' 5"  (1.651 m)  Wt 151 lb 1.6 oz (68.539 kg)  BMI 25.14 kg/m2  SpO2 96%  Discharge Labs:  No results found for this or any previous visit (from the past 24 hour(s)).  Signed: Darrick HuntsmanWilliam R Anari Evitt, MD 09/28/2015, 7:44 AM    Services Ordered on Discharge: PT/OT Equipment Ordered on Discharge: None (to be assessed at next venue)

## 2015-09-28 NOTE — Discharge Summary (Signed)
Name: Rose Bridges MRN: [id number] DOB: 11-14-1960 55 y.o. PCP: No primary care provider on file.  Date of Admission: 09/25/2015  8:06 AM Date of Discharge: 09/29/2015 Attending Physician: Rose SpainElizabeth A Butcher, MD  Discharge Diagnosis: 1. Moderately displaced left femoral neck fracture s/p hemiarthoplasty   Active Problems:   Hip fracture (HCC)   Fall from slip, trip, or stumble   Left displaced femoral neck fracture (HCC)  Discharge Medications:   Medication List    TAKE these medications        docusate sodium 100 MG capsule  Commonly known as:  COLACE  Take 1 capsule (100 mg total) by mouth 2 (two) times daily. Continue this while taking narcotics to help with bowel movements     enoxaparin 40 MG/0.4ML injection  Commonly known as:  LOVENOX  Inject 0.4 mLs (40 mg total) into the skin daily.     HYDROcodone-acetaminophen 5-325 MG tablet  Commonly known as:  NORCO  Take 2 tablets by mouth every 4 (four) hours as needed.        Disposition and follow-up:   Ms.Rose Rose Jamesittman was [facility] Good condition.  At the hospital follow up visit please address:  1.  Is patient tolerating PT well? Has she followed up with orthopedics?   Will need to continue Lovenox for post-op DVT prophylaxis for 4 weeks (start date 09/29/2015). Monitor for signs/symptoms of bleeding.  2.  Labs / imaging needed at time of follow-up: None  3.  Pending labs/ test needing follow-up: None  Follow-up Appointments: Follow-up Information    Follow up with [facility] In 2 weeks.   Why:  To establish PCP   Contact information:   [address] [place] [id number] [phone]      Follow up with [name] .   Specialty:  Skilled Nursing Facility   Contact information:   [address] [place] [phone]      Discharge Instructions: Discharge Instructions     Call MD for:  persistant dizziness or light-headedness    Complete by:  As directed      Call MD for:  persistant nausea and vomiting    Complete by:  As directed      Call MD for:  redness, tenderness, or signs of infection (pain, swelling, redness, odor or green/yellow discharge around incision site)    Complete by:  As directed      Call MD for:  severe uncontrolled pain    Complete by:  As directed      Call MD for:  temperature >100.4    Complete by:  As directed      Diet - low sodium heart healthy    Complete by:  As directed      Diet - low sodium heart healthy    Complete by:  As directed      Increase activity slowly    Complete by:  As directed            Consultations: Treatment Team:  Rose Apleyimothy D Murphy, MD  Procedures Performed:  Dg Chest 1 View  09/25/2015  CLINICAL DATA:  Preop for left hip fracture. EXAM: CHEST 1 VIEW COMPARISON:  None. FINDINGS: The heart size and mediastinal contours are within normal limits. Both lungs are clear. The visualized skeletal structures are unremarkable. IMPRESSION: No acute cardiopulmonary abnormality seen. Electronically Signed   By: Lupita RaiderJames  Green Jr, M.D.  On: 09/25/2015 09:00   Pelvis Portable  09/26/2015  CLINICAL DATA:  Postop for left hip arthroplasty. EXAM: PORTABLE PELVIS 1-2 VIEWS COMPARISON:  09/25/2015 FINDINGS: Interval left hip arthroplasty. The distal portion is incompletely imaged, but no hardware complication or periprosthetic fracture is identified. IMPRESSION: Expected appearance after left hip arthroplasty. Distal portion of the arthroplasty not imaged. Electronically Signed   By: Jeronimo Greaves M.D.   On: 09/26/2015 11:03   Dg Hip Unilat W Or W/o Pelvis 2-3 Views Left  09/25/2015  CLINICAL DATA:  Severe left hip pain after fall in bathroom last night. Initial encounter. EXAM: DG HIP (WITH OR WITHOUT PELVIS) 2-3V LEFT COMPARISON:  None. FINDINGS: Moderately displaced and angulated fracture is seen involving the proximal  left femoral neck. Right hip appears normal. This fracture appears to be closed and posttraumatic. No significant degenerative changes are noted. IMPRESSION: Moderately displaced and angulated proximal left femoral neck fracture. Electronically Signed   By: Lupita Raider, M.D.   On: 09/25/2015 08:59    2D Echo: None  Cardiac Cath: None  Admission HPI: [name] is a 55 year old 30-pack-year current smoker with a variant of osteogenesis imperfecta complicated by many upper and lower extremity fractures in the past, alcohol use disorder, and HTN who presents with left hip pain after a fall last night. Last night around 2 AM, after she had "a drink and a beer", she was in the bathroom when she slipped on a rug and fell on her hip. She immediately felt severe left hip pain. She did not hit her head and had full recollection of the events, but could not get up, so she fell asleep. She still the pain can get up when she woke up this morning, so she was brought in. She denies any dizziness or lightheadedness or other presyncopal symptoms prior to slipping, and denies weakness, numbness, tingling, urinary or bowel incontinence, any other pain besides the left hip and groin pain, or any chest pain, shortness of breath, nausea, vomiting, abdominal pain, or other issues at this time.   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   FHx: Osteogenesis imperfecta in her father, paternal grandfather, daughter, and granddaughter. No other FH not previously noted.  Hospital Course by problem list: Active Problems:   Hip fracture (HCC)   Fall from slip, trip, or stumble   Left displaced  femoral neck fracture (HCC)   1. Moderately displaced left femoral neck fracture s/p hemiarthoplasty - sustained after slipping on a rug in the setting of alcohol intoxication and osteogenesis imperfecta. Neurovascularly intact without other symptoms. She underwent hemiarthoplasty on 09/26/15 without complications. She was put on ASA 325 by orthopedics for DVT prophylaxis during admission and transitioned to Lovenox on day of discharge. PT/OT recommended SNF placement, and she was discharged to SNF thereafter.  Discharge Vitals:   BP 131/80 mmHg  Pulse 74  Temp(Src) 98.5 F (36.9 C) (Oral)  Resp 17  Ht  (1.651 m)  Wt 151 lb 1.6 oz (68.539 kg)  BMI 25.14 kg/m2  SpO2 99%  Discharge Labs:  Results for orders placed or performed during the hospital encounter of 09/25/15 (from the past 24  hour(s))  Vitamin B12     Status: None   Collection Time: 09/29/15  5:00 AM  Result Value Ref Range   Vitamin B-12 694 180 - 914 pg/mL  CBC     Status: Abnormal   Collection Time: 09/29/15 10:13 AM  Result Value Ref Range   WBC 4.9 4.0 - 10.5 K/uL   RBC 3.65 (L) 3.87 - 5.11 MIL/uL   Hemoglobin 11.9 (L) 12.0 - 15.0 g/dL   HCT 40.9 81.1 - 91.4 %   MCV 104.4 (H) 78.0 - 100.0 fL   MCH 32.6 26.0 - 34.0 pg   MCHC 31.2 30.0 - 36.0 g/dL   RDW 78.2 95.6 - 21.3 %   Platelets 164 150 - 400 K/uL    Signed: Darreld Mclean, MD 09/29/2015, 11:24 AM    Services Ordered on Discharge: PT/OT Equipment Ordered on Discharge: 3 in 1 bedside

## 2015-09-29 LAB — CBC
HCT: 38.1 % (ref 36.0–46.0)
Hemoglobin: 11.9 g/dL — ABNORMAL LOW (ref 12.0–15.0)
MCH: 32.6 pg (ref 26.0–34.0)
MCHC: 31.2 g/dL (ref 30.0–36.0)
MCV: 104.4 fL — ABNORMAL HIGH (ref 78.0–100.0)
PLATELETS: 164 10*3/uL (ref 150–400)
RBC: 3.65 MIL/uL — ABNORMAL LOW (ref 3.87–5.11)
RDW: 12.6 % (ref 11.5–15.5)
WBC: 4.9 10*3/uL (ref 4.0–10.5)

## 2015-09-29 LAB — VITAMIN B12: VITAMIN B 12: 694 pg/mL (ref 180–914)

## 2015-09-29 MED ORDER — DEXTROSE-NACL 5-0.45 % IV SOLN: 100.0000 mL/h | INTRAVENOUS | Status: DC

## 2015-09-29 MED ORDER — POVIDONE-IODINE 10 % EX SWAB: 2.0000 "application " | Freq: Once | CUTANEOUS | Status: DC

## 2015-09-29 MED ORDER — ENOXAPARIN SODIUM 40 MG/0.4ML ~~LOC~~ SOLN: 40.0000 mg | SUBCUTANEOUS | Status: DC

## 2015-09-29 MED ORDER — ENOXAPARIN SODIUM 40 MG/0.4ML ~~LOC~~ SOLN
40.0000 mg | SUBCUTANEOUS | Status: DC
  Administered 2015-09-29: 40 mg via SUBCUTANEOUS
  Filled 2015-09-29: qty 0.4

## 2015-09-29 MED ORDER — CHLORHEXIDINE GLUCONATE 4 % EX LIQD
60.0000 mL | Freq: Once | CUTANEOUS | Status: DC
  Filled 2015-09-29: qty 60

## 2015-09-29 MED ORDER — CEFAZOLIN SODIUM-DEXTROSE 2-4 GM/100ML-% IV SOLN: 2.0000 g | INTRAVENOUS | Status: DC

## 2015-09-29 MED ORDER — SENNOSIDES-DOCUSATE SODIUM 8.6-50 MG PO TABS
2.0000 | ORAL_TABLET | Freq: Two times a day (BID) | ORAL | Status: DC
Start: 2015-09-29 — End: 2015-09-29
  Administered 2015-09-29: 2 via ORAL
  Filled 2015-09-29: qty 2

## 2015-09-29 NOTE — Clinical Social Work Note (Signed)
Patient to be [facility] to [facility]. Patient and patient's sister-in-law updated regarding discharge. Patient to be transported via EMS. RN report number: [phone]  Marcelline Deistmily Brecklynn Jian, LCSW [phone] Orthopedics: [phone] Surgical: [phone]

## 2015-09-29 NOTE — Clinical Social Work Placement (Signed)
   CLINICAL SOCIAL WORK PLACEMENT  NOTE  Date:  09/29/2015  Patient Details  Name: Rose Bridges MRN: [id number] Date of Birth: August 12, 1960  Clinical Social Work is seeking post-discharge placement for this patient at the   level of care (*CSW will initial, date and re-position this form in  chart as items are completed):      Patient/family provided with [facility] Clinical Social Work Department's list of facilities offering this level of care within the geographic area requested by the patient (or if unable, by the patient's family).      Patient/family informed of their freedom to choose among providers that offer the needed level of care, that participate in Medicare, Medicaid or managed care program needed by the patient, have an available bed and are willing to accept the patient.      Patient/family informed of 's ownership interest in [facility] and [facility], as well as of the fact that they are under no obligation to receive care at these facilities.  PASRR submitted to EDS on       PASRR number received on       Existing PASRR number confirmed on       FL2 transmitted to all facilities in geographic area requested by pt/family on       FL2 transmitted to all facilities within larger geographic area on       Patient informed that his/her managed care company has contracts with or will negotiate with certain facilities, including the following:        Yes   Patient/family informed of bed offers received.  [facility]     Physician recommends and patient chooses bed at      Patient to be [facility] on 09/29/15.  Patient to be transferred to facility by [name]     Patient family notified on 09/29/15 of transfer.  Name of family member notified:  Patient and patient's sister-in-law     PHYSICIAN       Additional Comment:    _______________________________________________ Rod MaeVaughn, Skyley Grandmaison S,  LCSW 09/29/2015, 11:50 AM

## 2015-09-29 NOTE — Discharge Instructions (Signed)
We are discharging you to a Skilled Nursing Facility to help you gain your strength back. To help prevent blood clots, we are giving you a medication called Lovenox which will need to be continued for a total of 4 weeks. Please watch for any obvious bleeding and get medical attention if this occurs.

## 2015-09-29 NOTE — Progress Notes (Addendum)
   Subjective: Rose Bridges is POD #3. She has been working with PT and walking to the bathroom without issue. She reports some muscular pain when laying still which improves with movement and some pain when laying on her left side, otherwise no issues with good pain control. She denies any CP, SOB, lightheadedness.  Objective: Vital signs in last 24 hours: Filed Vitals:   09/27/15 2112 09/28/15 0437 09/28/15 1500 09/28/15 2100  BP: 125/64 126/76 133/89 131/80  Pulse: 88 82 88 74  Temp: 98.9 F (37.2 C) 98.2 F (36.8 C) 98.4 F (36.9 C) 98.5 F (36.9 C)  TempSrc: Oral Oral Oral Oral  Resp:   18 17  Height:      Weight:      SpO2: 95% 96% 100% 99%   Weight change:   Intake/Output Summary (Last 24 hours) at 09/29/15 1047 Last data filed at 09/29/15 0900  Gross per 24 hour  Intake    720 ml  Output      0 ml  Net    720 ml   General: resting in bed, no acute distress Cardiac: RRR, no rubs, murmurs or gallops Pulm: clear to auscultation bilaterally, moving normal volumes of air Abd: soft, nontender, nondistended, BS present Ext: warm and well perfused, no pedal edema, moves all extremities, dorsiflexion and plantar flexion intact bilaterally, DP pulses +2 bilaterally Neuro: alert and oriented X3   Assessment/Plan: 1. Moderately displaced left femoral neck fracture s/p hemiarthoplasty - sustained after slipping on a rug in the setting of alcohol intoxication and osteogenesis imperfecta. Neurovascularly intact without other symptoms. Last fracture was a right tib-fib fracture 6 or 7 years ago, but has had several wrist, finger, and toe fractures in the past as well as jaw abnormalities. Now s/p hemiarthoplasty on 09/26/15.  -Orthopedics on board; appreciate the thoughtful care of this mutual patient -Ketorolac IV scheduled, Norco PO PRN, Morphine IV for breakthrough -Methocarbamol for muscle spasms -PT/OT recommending SNF -No bisphosphonate on discharge given previous intolerance  to alendronate -Will switch ASA 325 mg to Lovenox for post-op VTE prophylaxis for at least 4 weeks -f/u CBC today >> platelets normalized at 164k compared to 127k  2. Alcohol use disorder - Thought to contribute to fall. No history of withdrawal seizures, per patient. Blood alcohol positive on admission. No s/s WD while here. -CIWA protocol -Encouraged alcohol cessation  3. HTN - says she has been on medicines in the past, but does not follow with a PCP currently and does not take any medicine for now -Consider starting oral agent to continue on discharge -Encouraged tobacco cessation -f/u with [facility]  Dispo:  Anticip[facility].  The patient does not have a current PCP (No primary care provider on file.) and does need [facility] follow-[facility] appointment after discharge.  The patient does have transportation limitations that hinder transportation to clinic appointments.   LOS: 4 days   Darreld McleanVishal Chirstopher Iovino, MD 09/29/2015, 10:47 AM

## 2015-09-29 NOTE — Progress Notes (Signed)
Subjective: 3 Days Post-Op Procedure(s) (LRB): ARTHROPLASTY BIPOLAR HIP (HEMIARTHROPLASTY) (Left) Patient reports pain as moderate.  Increased pain while sleeping and lying on left side.  Otherwise, doing well.  Objective: Vital signs in last 24 hours: Temp:  [98.4 F (36.9 C)-98.5 F (36.9 C)] 98.5 F (36.9 C) (05/31 2100) Pulse Rate:  [74-88] 74 (05/31 2100) Resp:  [17-18] 17 (05/31 2100) BP: (131-133)/(80-89) 131/80 mmHg (05/31 2100) SpO2:  [99 %-100 %] 99 % (05/31 2100)  Intake/Output from previous day: 05/31 0701 - 06/01 0700 In: 483 [P.O.:480; I.V.:3] Out: -  Intake/Output this shift:     Recent Labs  09/27/15 0332  HGB 12.4    Recent Labs  09/27/15 0332  WBC 4.4  RBC 3.75*  HCT 39.4  PLT 127*    Recent Labs  09/27/15 0332  NA 135  K 4.1  CL 102  CO2 25  BUN 6  CREATININE 0.77  GLUCOSE 103*  CALCIUM 7.9*   No results for input(s): LABPT, INR in the last 72 hours.  Neurologically intact Neurovascular intact Sensation intact distally Intact pulses distally Dorsiflexion/Plantar flexion intact Compartment soft  Assessment/Plan: 3 Days Post-Op Procedure(s) (LRB): ARTHROPLASTY BIPOLAR HIP (HEMIARTHROPLASTY) (Left) Up with therapy  WBAT-posterior hip precautions ASA 325 mg for DVT ppx Continue plan per medicine  Otilio SaberM Lindsey Nyriah Coote 09/29/2015, 6:11 AM

## 2015-09-29 NOTE — Progress Notes (Signed)
[facility], [place].

## 2015-09-30 ENCOUNTER — Encounter: Payer: Self-pay | Admitting: Internal Medicine

## 2015-09-30 ENCOUNTER — Non-Acute Institutional Stay (SKILLED_NURSING_FACILITY): Payer: BLUE CROSS/BLUE SHIELD | Admitting: Internal Medicine

## 2015-09-30 DIAGNOSIS — IMO0001 Reserved for inherently not codable concepts without codable children: Secondary | ICD-10-CM

## 2015-09-30 DIAGNOSIS — R030 Elevated blood-pressure reading, without diagnosis of hypertension: Secondary | ICD-10-CM | POA: Diagnosis not present

## 2015-09-30 DIAGNOSIS — K5901 Slow transit constipation: Secondary | ICD-10-CM | POA: Diagnosis not present

## 2015-09-30 DIAGNOSIS — R2681 Unsteadiness on feet: Secondary | ICD-10-CM

## 2015-09-30 DIAGNOSIS — R1310 Dysphagia, unspecified: Secondary | ICD-10-CM

## 2015-09-30 DIAGNOSIS — S72002S Fracture of unspecified part of neck of left femur, sequela: Secondary | ICD-10-CM

## 2015-09-30 DIAGNOSIS — D62 Acute posthemorrhagic anemia: Secondary | ICD-10-CM

## 2015-09-30 NOTE — Progress Notes (Signed)
LOCATION: [name]  PCP: No primary care provider on file.   Code Status: Full Code  Goals of care: Advanced Directive information Advanced Directives 09/25/2015  Does patient have an advance directive? No       Extended Emergency Contact Information Primary Emergency Contact: CHAMBERS,BETTY Address: [address]          [place] Home Phone: [phone] Relation: None Secondary Emergency Contact: Mertie Moores [place] Home Phone: [phone] Relation: Relative   Allergies  Allergen Reactions  . Codeine     "migraines"     Chief Complaint  Patient presents with  . New Admit To SNF    New Admission     HPI:  Patient is a 55 y.o. female seen today for short term rehabilitation post hospital admission from 09/25/15-09/28/15 with moderately displaced left femoral neck fracture. She underwent left hip hemiarthroplasty. She is seen in her room today. She complaints of pain to her left hip. She was constipated until yesterday evening and now has had several bowel movement with help of laxative and stool softner and would like dosing adjusted. She complaints of dysphagia. No other concerns.   Review of Systems:  Constitutional: Negative for fever, chills, diaphoresis. Energy level is slowly returning.  HENT: Negative for headache, congestion, nasal discharge. Eyes: Negative for blurred vision, double vision and discharge.  Respiratory: Negative for cough, shortness of breath and wheezing.   Cardiovascular: Negative for chest pain, palpitations, leg swelling.  Gastrointestinal: Negative for heartburn, nausea, vomiting, abdominal pain. Last bowel movement was yesterday. Positive for poor appetite.  Genitourinary: Negative for dysuria and flank pain.  Musculoskeletal: Negative for back pain, fall in the facility.  Skin: Negative for itching, rash.  Neurological: Negative for dizziness. Psychiatric/Behavioral: Negative for  depression   Past Medical History  Diagnosis Date  . Hypertension   . Hyperlipemia    Past Surgical History  Procedure Laterality Date  . Abdominal hysterectomy    . Hip arthroplasty Left 09/26/2015    Procedure: ARTHROPLASTY BIPOLAR HIP (HEMIARTHROPLASTY);  Surgeon: Sheral Apley, MD;  Location: [facility] OR;  Service: Orthopedics;  Laterality: Left;   Social History:   reports that she has been smoking.  She does not have any smokeless tobacco history on file. She reports that she drinks about 4.2 oz of alcohol per week. She reports that she does not use illicit drugs.  No family history on file.  Medications:   Medication List       This list is accurate as of: 09/30/15 11:21 AM.  Always use your most recent med list.               docusate sodium 100 MG capsule  Commonly known as:  COLACE  Take 1 capsule (100 mg total) by mouth 2 (two) times daily. Continue this while taking narcotics to help with bowel movements     enoxaparin 40 MG/0.4ML injection  Commonly known as:  LOVENOX  Inject 0.4 mLs (40 mg total) into the skin daily.     HYDROcodone-acetaminophen 5-325 MG tablet  Commonly known as:  NORCO  Take 2 tablets by mouth every 4 (four) hours as needed.        Immunizations: Immunization History  Administered Date(s) Administered  . PPD Test 09/29/2015     Physical Exam: Filed Vitals:   09/30/15 1113  BP: 150/88  Pulse: 91  Temp: 97.3 F (36.3 C)  TempSrc: Oral  Resp: 18  Height:  5\' 5"  (1.651 m)  Weight: 151 lb (68.493 kg)  SpO2: 96%   Body mass index is 25.13 kg/(m^2).  General- elderly female, well built, in no acute distress Head- normocephalic, atraumatic Nose- no maxillary or frontal sinus tenderness, no nasal discharge Throat- moist mucus membrane, poor dentition, has lower dentures Eyes- PERRLA, EOMI, no pallor, no icterus, no discharge, normal conjunctiva, normal sclera Neck- no cervical lymphadenopathy Cardiovascular- normal s1,s2, no  murmur, trace leg edema Respiratory- bilateral clear to auscultation, no wheeze, no rhonchi, no crackles, no use of accessory muscles Abdomen- bowel sounds present, soft, non tender Musculoskeletal- able to move all 4 extremities, limited left leg range of motion, ted hose to her legs, left hip surgical incision with mepelex dressing Neurological- alert and oriented to person, place and time Skin- warm and dry, bruise to her arms Nails- hypertrophy, ingrown Psychiatry- normal mood and affect    Labs reviewed: Basic Metabolic Panel:  Recent Labs  16/10/96 0925 09/25/15 1000 09/27/15 0332  NA 140  --  135  K 4.0  --  4.1  CL 106  --  102  CO2 24  --  25  GLUCOSE 140*  --  103*  BUN 6  --  6  CREATININE 0.67  --  0.77  CALCIUM 9.0  --  7.9*  MG  --  1.5* 2.0   CBC:  Recent Labs  09/26/15 0410 09/27/15 0332 09/29/15 1013  WBC 5.6 4.4 4.9  HGB 14.0 12.4 11.9*  HCT 44.3 39.4 38.1  MCV 105.0* 105.1* 104.4*  PLT 127* 127* 164    Radiological Exams: Dg Chest 1 View  09/25/2015  CLINICAL DATA:  Preop for left hip fracture. EXAM: CHEST 1 VIEW COMPARISON:  None. FINDINGS: The heart size and mediastinal contours are within normal limits. Both lungs are clear. The visualized skeletal structures are unremarkable. IMPRESSION: No acute cardiopulmonary abnormality seen. Electronically Signed   By: Lupita Raider, M.D.   On: 09/25/2015 09:00   Pelvis Portable  09/26/2015  CLINICAL DATA:  Postop for left hip arthroplasty. EXAM: PORTABLE PELVIS 1-2 VIEWS COMPARISON:  09/25/2015 FINDINGS: Interval left hip arthroplasty. The distal portion is incompletely imaged, but no hardware complication or periprosthetic fracture is identified. IMPRESSION: Expected appearance after left hip arthroplasty. Distal portion of the arthroplasty not imaged. Electronically Signed   By: Jeronimo Greaves M.D.   On: 09/26/2015 11:03   Dg Hip Unilat W Or W/o Pelvis 2-3 Views Left  09/25/2015  CLINICAL DATA:  Severe  left hip pain after fall in bathroom last night. Initial encounter. EXAM: DG HIP (WITH OR WITHOUT PELVIS) 2-3V LEFT COMPARISON:  None. FINDINGS: Moderately displaced and angulated fracture is seen involving the proximal left femoral neck. Right hip appears normal. This fracture appears to be closed and posttraumatic. No significant degenerative changes are noted. IMPRESSION: Moderately displaced and angulated proximal left femoral neck fracture. Electronically Signed   By: Lupita Raider, M.D.   On: 09/25/2015 08:59    Assessment/Plan  Unsteady gait Will have patient work with PT/OT as tolerated to regain strength and restore function.  Fall precautions are in place.  Left femoral neck fracture S/p hemiarthroplasty. Has follow up with orthopedics. Will have her work with physical therapy and occupational therapy team to help with gait training and muscle strengthening exercises.fall precautions. Skin care. Encourage to be out of bed. Continue norco 5-325 mg 2 tab q4h prn pain and lovenox daily for dvt prophylaxis. Get physiatry consult for pain management  Blood loss anemia Post op, monitor cbc  Constipation Stable, d/c colace, senna s and miralax current order. Start senna s 2 tab qhs and miralax daily as needed only  Dysphagia Get SLP consult for now and monitor  Elevated BP No known hx of HTN, her pain could be contributing some. check bp and HR daily for now and assess need for antihypertensives   Goals of care: short term rehabilitation   Labs/tests ordered: cbc, cmp 10/03/15  Family/ staff Communication: reviewed care plan with patient and nursing supervisor    Oneal GroutMAHIMA Nadeen Shipman, MD Internal Medicine [facility] [address] [place], [place] [address] Cell Phone (Monday-Friday 8 am - 5 pm): [phone] On Call: [phone] and follow prompts after 5 pm and on weekends Office Phone: [phone] Office Fax: [phone]

## 2015-10-03 LAB — BASIC METABOLIC PANEL
BUN: 12 mg/dL (ref 4–21)
CREATININE: 0.7 mg/dL (ref 0.5–1.1)
GLUCOSE: 101 mg/dL
POTASSIUM: 4.7 mmol/L (ref 3.4–5.3)
SODIUM: 140 mmol/L (ref 137–147)

## 2015-10-03 LAB — HEPATIC FUNCTION PANEL
ALK PHOS: 180 U/L — AB (ref 25–125)
ALT: 25 U/L (ref 7–35)
AST: 36 U/L — AB (ref 13–35)
BILIRUBIN, TOTAL: 0.4 mg/dL

## 2015-10-03 LAB — CBC AND DIFFERENTIAL
HCT: 42 % (ref 36–46)
Hemoglobin: 13 g/dL (ref 12.0–16.0)
NEUTROS ABS: 2 /uL
Platelets: 350 10*3/uL (ref 150–399)
WBC: 5.3 10^3/mL

## 2015-10-11 ENCOUNTER — Non-Acute Institutional Stay (SKILLED_NURSING_FACILITY): Payer: BLUE CROSS/BLUE SHIELD | Admitting: Adult Health

## 2015-10-11 DIAGNOSIS — E785 Hyperlipidemia, unspecified: Secondary | ICD-10-CM

## 2015-10-11 NOTE — Progress Notes (Signed)
Patient ID: Rose Bridges, female   DOB: 03-09-1961, 55 y.o.   MRN: [id number]    DATE:  10/11/15  MRN:  [id number]  BIRTHDAY: 19-Feb-1961  Facility:  Nursing Home Location:  [name]  Nursing Home Room Number: [id number]  LEVEL OF CARE:  SNF [phone])  Contact Information    Name Relation Home Work [place] Other [phone]  [phone]   Rose Bridges Relative [phone]     Rose Bridges [phone]         Code Status History    Date Active Date Inactive Code Status Order ID Comments User Context   09/25/2015 10:10 AM 09/29/2015  3:52 PM Full Code [id number]  Lora Paula, MD ED       Chief Complaint  Patient presents with  . Acute Visit    HLD    HISTORY OF PRESENT ILLNESS:  This is a 55 year old female who has been noted to have cholesterol 225, HDL 40.5, LDL 146 and triglycerides 638. Patient said that she has been taking statin before but has to stop because of insurance issues.  She has been admitted to [facility] on 09/29/15 from [facility] with moderately displaced left femoral neck fracture for which she had left hip hemiarthroplasty on 09/26/15. She has been admitted for a short-term rehabilitation.  PAST MEDICAL HISTORY:  Past Medical History  Diagnosis Date  . Hypertension   . Hyperlipemia      CURRENT MEDICATIONS: Reviewed  Patient's Medications  New Prescriptions   No medications on file  Previous Medications   ATORVASTATIN (LIPITOR) 10 MG TABLET    Take 10 mg by mouth at bedtime.   ENOXAPARIN (LOVENOX) 40 MG/0.4ML INJECTION    Inject 0.4 mLs (40 mg total) into the skin daily.   MAGNESIUM HYDROXIDE (MILK OF MAGNESIA PO)    Take by mouth.   METHOCARBAMOL (ROBAXIN) 500 MG TABLET    Take 500 mg by mouth every 6 (six) hours as needed for muscle spasms.   OXYCODONE-ACETAMINOPHEN (PERCOCET) 10-325 MG TABLET    Take 1 tablet by mouth every 4 (four) hours.   POLYETHYLENE GLYCOL (MIRALAX / GLYCOLAX)  PACKET    Take 17 g by mouth daily as needed.   PROTEIN (PROCEL) POWD    Take 1 scoop by mouth 2 (two) times daily.   SENNA (SENOKOT) 8.6 MG TABS TABLET    Take 2 tablets by mouth at bedtime.  Modified Medications   No medications on file  Discontinued Medications   DOCUSATE SODIUM (COLACE) 100 MG CAPSULE    Take 1 capsule (100 mg total) by mouth 2 (two) times daily. Continue this while taking narcotics to help with bowel movements   HYDROCODONE-ACETAMINOPHEN (NORCO) 5-325 MG TABLET    Take 2 tablets by mouth every 4 (four) hours as needed.     Allergies  Allergen Reactions  . Codeine     "migraines"      REVIEW OF SYSTEMS:  GENERAL: no change in appetite, no fatigue, no weight changes, no fever, chills or weakness EYES: Denies change in vision, dry eyes, eye pain, itching or discharge EARS: Denies change in hearing, ringing in ears, or earache NOSE: Denies nasal congestion or epistaxis MOUTH and THROAT: Denies oral discomfort, gingival pain or bleeding, pain from teeth or hoarseness   RESPIRATORY: no cough, SOB, DOE, wheezing, hemoptysis CARDIAC: no chest pain, edema or palpitations GI: no abdominal pain, diarrhea, constipation, heart burn, nausea or vomiting GU: Denies dysuria, frequency, hematuria,  incontinence, or discharge PSYCHIATRIC: Denies feeling of depression or anxiety. No report of hallucinations, insomnia, paranoia, or agitation    PHYSICAL EXAMINATION  GENERAL APPEARANCE: Well nourished. In no acute distress. Normal body habitus SKIN:  Left hip surgical incision has steri-strips, dry, no erythema HEAD: Normal in size and contour. No evidence of trauma EYES: Lids open and close normally. No blepharitis, entropion or ectropion. PERRL. Conjunctivae are clear and sclerae are white. Lenses are without opacity EARS: Pinnae are normal. Patient hears normal voice tunes of the examiner MOUTH and THROAT: Lips are without lesions. Oral mucosa is moist and without lesions.  Tongue is normal in shape, size, and color and without lesions NECK: supple, trachea midline, no neck masses, no thyroid tenderness, no thyromegaly LYMPHATICS: no LAN in the neck, no supraclavicular LAN RESPIRATORY: breathing is even & unlabored, BS CTAB CARDIAC: RRR, no murmur,no extra heart sounds, no edema GI: abdomen soft, normal BS, no masses, no tenderness, no hepatomegaly, no splenomegaly EXTREMITIES:  Able to move X 4 extremities PSYCHIATRIC: Alert and oriented X 3. Affect and behavior are appropriate  LABS/RADIOLOGY: Labs reviewed: 10/10/15   Cholesterol 225  HDL 40.5  LDL 146  Triglycerides 191 Basic Metabolic Panel:  Recent Labs  40/98/11 0925 09/25/15 1000 09/27/15 0332 10/03/15  NA 140  --  135 140  K 4.0  --  4.1 4.7  CL 106  --  102  --   CO2 24  --  25  --   GLUCOSE 140*  --  103*  --   BUN 6  --  6 12  CREATININE 0.67  --  0.77 0.7  CALCIUM 9.0  --  7.9*  --   MG  --  1.5* 2.0  --    Liver Function Tests:  Recent Labs  10/03/15  AST 36*  ALT 25  ALKPHOS 180*   CBC:  Recent Labs  09/26/15 0410 09/27/15 0332 09/29/15 1013 10/03/15  WBC 5.6 4.4 4.9 5.3  NEUTROABS  --   --   --  2  HGB 14.0 12.4 11.9* 13.0  HCT 44.3 39.4 38.1 42  MCV 105.0* 105.1* 104.4*  --   PLT 127* 127* 164 350     Dg Chest 1 View  09/25/2015  CLINICAL DATA:  Preop for left hip fracture. EXAM: CHEST 1 VIEW COMPARISON:  None. FINDINGS: The heart size and mediastinal contours are within normal limits. Both lungs are clear. The visualized skeletal structures are unremarkable. IMPRESSION: No acute cardiopulmonary abnormality seen. Electronically Signed   By: Lupita Raider, M.D.   On: 09/25/2015 09:00   Pelvis Portable  09/26/2015  CLINICAL DATA:  Postop for left hip arthroplasty. EXAM: PORTABLE PELVIS 1-2 VIEWS COMPARISON:  09/25/2015 FINDINGS: Interval left hip arthroplasty. The distal portion is incompletely imaged, but no hardware complication or periprosthetic fracture is  identified. IMPRESSION: Expected appearance after left hip arthroplasty. Distal portion of the arthroplasty not imaged. Electronically Signed   By: Jeronimo Greaves M.D.   On: 09/26/2015 11:03   Dg Hip Unilat W Or W/o Pelvis 2-3 Views Left  09/25/2015  CLINICAL DATA:  Severe left hip pain after fall in bathroom last night. Initial encounter. EXAM: DG HIP (WITH OR WITHOUT PELVIS) 2-3V LEFT COMPARISON:  None. FINDINGS: Moderately displaced and angulated fracture is seen involving the proximal left femoral neck. Right hip appears normal. This fracture appears to be closed and posttraumatic. No significant degenerative changes are noted. IMPRESSION: Moderately displaced and angulated proximal left femoral  neck fracture. Electronically Signed   By: Lupita RaiderJames  Green Jr, M.D.   On: 09/25/2015 08:59    ASSESSMENT/PLAN:  Hyperlipidemia - start Atorvastatin 10 mg 1 tab PO daily    Kenard GowerMonina Medina-Vargas, NP [facility] [phone]

## 2015-10-12 ENCOUNTER — Encounter: Payer: Self-pay | Admitting: Adult Health

## 2015-10-13 ENCOUNTER — Other Ambulatory Visit: Payer: Self-pay

## 2015-10-13 ENCOUNTER — Encounter: Payer: Self-pay | Admitting: Adult Health

## 2015-10-13 ENCOUNTER — Non-Acute Institutional Stay (SKILLED_NURSING_FACILITY): Payer: BLUE CROSS/BLUE SHIELD | Admitting: Adult Health

## 2015-10-13 DIAGNOSIS — K5901 Slow transit constipation: Secondary | ICD-10-CM | POA: Diagnosis not present

## 2015-10-13 DIAGNOSIS — S72002S Fracture of unspecified part of neck of left femur, sequela: Secondary | ICD-10-CM | POA: Diagnosis not present

## 2015-10-13 DIAGNOSIS — E46 Unspecified protein-calorie malnutrition: Secondary | ICD-10-CM

## 2015-10-13 DIAGNOSIS — R2681 Unsteadiness on feet: Secondary | ICD-10-CM | POA: Diagnosis not present

## 2015-10-13 DIAGNOSIS — E785 Hyperlipidemia, unspecified: Secondary | ICD-10-CM

## 2015-10-13 MED ORDER — HYDROCODONE-ACETAMINOPHEN 10-325 MG PO TABS: ORAL_TABLET | ORAL | Status: DC

## 2015-10-13 NOTE — Progress Notes (Signed)
Patient ID: Rose Bridges, female   DOB: [birth year], 55 y.o.   MRN: [id number]    DATE:  10/13/15  MRN:  [id number]  BIRTHDAY: 09/30/1960  Facility:  Nursing Home Location:  [name]  Nursing Home Room Number: 603-P  LEVEL OF CARE:  SNF [phone])  Contact Information    Name Relation Home Work [place] Other [phone]  [phone]   Consuella Lose Relative [phone]     Julee, Stoll [phone]         Code Status History    Date Active Date Inactive Code Status Order ID Comments User Context   09/25/2015 10:10 AM 09/29/2015  3:52 PM Full Code [id number]  Lora Paula, MD ED       Chief Complaint  Patient presents with  . Discharge Note    HISTORY OF PRESENT ILLNESS:  This is a 55 year old female who is for discharge home With home health PT for endurance, OT for ADLs and CNA for showers. DME:  Standard rolling walker and 3 in 1 bedside commode.  She was recently started on Lipitor for hyperlipidemia and Procel for protein calorie malnutrition, albumin 3.05.  She has been admitted to [facility] on 09/29/15 from [facility] with moderately displaced left femoral neck fracture for which she had left hip hemiarthroplasty on 09/26/15.   Patient was admitted to this facility for short-term rehabilitation after the patient's recent hospitalization.  Patient has completed SNF rehabilitation and therapy has cleared the patient for discharge.   PAST MEDICAL HISTORY:  Past Medical History  Diagnosis Date  . Hypertension   . Hyperlipemia      CURRENT MEDICATIONS: Reviewed  Patient's Medications  New Prescriptions   HYDROCODONE-ACETAMINOPHEN (NORCO) 10-325 MG TABLET    Take 1/2 tablet by mouth every 4 hours as needed for moderate pain DO NOT EXCEED 4 GM OF APAP FROM ALL SOURCES  Previous Medications   ATORVASTATIN (LIPITOR) 10 MG TABLET    Take 10 mg by mouth at bedtime.   ENOXAPARIN (LOVENOX) 40 MG/0.4ML  INJECTION    Inject 0.4 mLs (40 mg total) into the skin daily.   MAGNESIUM HYDROXIDE (MILK OF MAGNESIA PO)    Take by mouth.   METHOCARBAMOL (ROBAXIN) 500 MG TABLET    Take 500 mg by mouth every 6 (six) hours as needed for muscle spasms.   OXYCODONE-ACETAMINOPHEN (PERCOCET) 10-325 MG TABLET    Take 0.5-1 tablets by mouth every 4 (four) hours. Take 1/2 tablet q4h prn for moderate pain.  Take 1 tablet q4h prn for severe pain.   POLYETHYLENE GLYCOL (MIRALAX / GLYCOLAX) PACKET    Take 17 g by mouth daily as needed.   PROTEIN (PROCEL) POWD    Take 1 scoop by mouth 2 (two) times daily.   SENNA (SENOKOT) 8.6 MG TABS TABLET    Take 2 tablets by mouth at bedtime.  Modified Medications   No medications on file  Discontinued Medications   No medications on file     Allergies  Allergen Reactions  . Codeine     "migraines"      REVIEW OF SYSTEMS:  GENERAL: no change in appetite, no fatigue, no weight changes, no fever, chills or weakness EYES: Denies change in vision, dry eyes, eye pain, itching or discharge EARS: Denies change in hearing, ringing in ears, or earache NOSE: Denies nasal congestion or epistaxis MOUTH and THROAT: Denies oral discomfort, gingival pain or bleeding, pain from teeth or hoarseness   RESPIRATORY:  no cough, SOB, DOE, wheezing, hemoptysis CARDIAC: no chest pain, edema or palpitations GI: no abdominal pain, diarrhea, constipation, heart burn, nausea or vomiting GU: Denies dysuria, frequency, hematuria, incontinence, or discharge PSYCHIATRIC: Denies feeling of depression or anxiety. No report of hallucinations, insomnia, paranoia, or agitation    PHYSICAL EXAMINATION  GENERAL APPEARANCE: Well nourished. In no acute distress. Normal body habitus SKIN:  Left hip surgical incision has steri-strips, dry, no erythema HEAD: Normal in size and contour. No evidence of trauma EYES: Lids open and close normally. No blepharitis, entropion or ectropion. PERRL. Conjunctivae are  clear and sclerae are white. Lenses are without opacity EARS: Pinnae are normal. Patient hears normal voice tunes of the examiner MOUTH and THROAT: Lips are without lesions. Oral mucosa is moist and without lesions. Tongue is normal in shape, size, and color and without lesions NECK: supple, trachea midline, no neck masses, no thyroid tenderness, no thyromegaly LYMPHATICS: no LAN in the neck, no supraclavicular LAN RESPIRATORY: breathing is even & unlabored, BS CTAB CARDIAC: RRR, no murmur,no extra heart sounds, no edema GI: abdomen soft, normal BS, no masses, no tenderness, no hepatomegaly, no splenomegaly EXTREMITIES:  Able to move X 4 extremities PSYCHIATRIC: Alert and oriented X 3. Affect and behavior are appropriate  LABS/RADIOLOGY: Labs reviewed: 10/10/15   Cholesterol 225  HDL 40.5  LDL 146  Triglycerides 191 Basic Metabolic Panel:  Recent Labs  16/10/96 0925 09/25/15 1000 09/27/15 0332 10/03/15  NA 140  --  135 140  K 4.0  --  4.1 4.7  CL 106  --  102  --   CO2 24  --  25  --   GLUCOSE 140*  --  103*  --   BUN 6  --  6 12  CREATININE 0.67  --  0.77 0.7  CALCIUM 9.0  --  7.9*  --   MG  --  1.5* 2.0  --    Liver Function Tests:  Recent Labs  10/03/15  AST 36*  ALT 25  ALKPHOS 180*   CBC:  Recent Labs  09/26/15 0410 09/27/15 0332 09/29/15 1013 10/03/15  WBC 5.6 4.4 4.9 5.3  NEUTROABS  --   --   --  2  HGB 14.0 12.4 11.9* 13.0  HCT 44.3 39.4 38.1 42  MCV 105.0* 105.1* 104.4*  --   PLT 127* 127* 164 350     Dg Chest 1 View  09/25/2015  CLINICAL DATA:  Preop for left hip fracture. EXAM: CHEST 1 VIEW COMPARISON:  None. FINDINGS: The heart size and mediastinal contours are within normal limits. Both lungs are clear. The visualized skeletal structures are unremarkable. IMPRESSION: No acute cardiopulmonary abnormality seen. Electronically Signed   By: Lupita Raider, M.D.   On: 09/25/2015 09:00   Pelvis Portable  09/26/2015  CLINICAL DATA:  Postop for left hip  arthroplasty. EXAM: PORTABLE PELVIS 1-2 VIEWS COMPARISON:  09/25/2015 FINDINGS: Interval left hip arthroplasty. The distal portion is incompletely imaged, but no hardware complication or periprosthetic fracture is identified. IMPRESSION: Expected appearance after left hip arthroplasty. Distal portion of the arthroplasty not imaged. Electronically Signed   By: Jeronimo Greaves M.D.   On: 09/26/2015 11:03   Dg Hip Unilat W Or W/o Pelvis 2-3 Views Left  09/25/2015  CLINICAL DATA:  Severe left hip pain after fall in bathroom last night. Initial encounter. EXAM: DG HIP (WITH OR WITHOUT PELVIS) 2-3V LEFT COMPARISON:  None. FINDINGS: Moderately displaced and angulated fracture is seen involving the proximal  left femoral neck. Right hip appears normal. This fracture appears to be closed and posttraumatic. No significant degenerative changes are noted. IMPRESSION: Moderately displaced and angulated proximal left femoral neck fracture. Electronically Signed   By: Lupita RaiderJames  Green Jr, M.D.   On: 09/25/2015 08:59    ASSESSMENT/PLAN:  Unsteady gait - for home health PT, OT and CNA  Left femoral neck fracture S/P hemiarthroplasty - for home health PT, OT and CNA; continue Norco 10/325 mg 1/2 to 1 tab by mouth every 4 hours when necessary for pain; Robaxin 500 mg 1 tab by mouth every 6 hours when necessary for muscle spasm; Lovenox 40 mg subcutaneous daily till 10/27/15 for DVT prophylaxis; follow-up with orthopedic surgeon  Constipation - continue senna 8.6 mg 2 tabs by mouth daily at bedtime  Protein calorie malnutrition - albumin 3.05; continue Procel 1 scoop by mouth twice a day  Hyperlipidemia - started on Atorvastatin 10 mg 1 tab PO daily      I have filled out patient's discharge paperwork and written prescriptions.  Patient will receive home health PT, OT and CNA.  DME provided:  Standard rolling walker and 3 in 1 bedside commode  Total discharge time: Greater than 30 minutes  Discharge time involved  coordination of the discharge process with social worker, nursing staff and therapy department. Medical justification for home health services/DME verified.    Kenard GowerMonina Medina-Vargas, NP [facility] [phone]

## 2015-10-13 NOTE — Telephone Encounter (Signed)
Rx faxed to [facility] @ [phone], phone number [phone]  

## 2015-10-15 DIAGNOSIS — I10 Essential (primary) hypertension: Secondary | ICD-10-CM | POA: Diagnosis not present

## 2015-10-15 DIAGNOSIS — Z96642 Presence of left artificial hip joint: Secondary | ICD-10-CM | POA: Diagnosis not present

## 2015-10-15 DIAGNOSIS — Z471 Aftercare following joint replacement surgery: Secondary | ICD-10-CM | POA: Diagnosis not present

## 2015-10-15 DIAGNOSIS — E1310 Other specified diabetes mellitus with ketoacidosis without coma: Secondary | ICD-10-CM | POA: Diagnosis not present

## 2015-10-19 ENCOUNTER — Other Ambulatory Visit: Payer: Self-pay | Admitting: Adult Health

## 2015-11-29 ENCOUNTER — Other Ambulatory Visit: Payer: Self-pay | Admitting: Adult Health

## 2016-10-21 ENCOUNTER — Emergency Department (HOSPITAL_COMMUNITY): Payer: BLUE CROSS/BLUE SHIELD

## 2016-10-21 ENCOUNTER — Encounter (HOSPITAL_COMMUNITY): Payer: Self-pay | Admitting: Emergency Medicine

## 2016-10-21 ENCOUNTER — Emergency Department (HOSPITAL_COMMUNITY)
Admission: EM | Admit: 2016-10-21 | Discharge: 2016-10-21 | Disposition: A | Discharge status: Home / Self Care | Payer: BLUE CROSS/BLUE SHIELD | Attending: Emergency Medicine | Admitting: Emergency Medicine

## 2016-10-21 DIAGNOSIS — Y999 Unspecified external cause status: Secondary | ICD-10-CM | POA: Insufficient documentation

## 2016-10-21 DIAGNOSIS — S4991XA Unspecified injury of right shoulder and upper arm, initial encounter: Secondary | ICD-10-CM | POA: Diagnosis present

## 2016-10-21 DIAGNOSIS — W06XXXA Fall from bed, initial encounter: Secondary | ICD-10-CM | POA: Diagnosis not present

## 2016-10-21 DIAGNOSIS — I10 Essential (primary) hypertension: Secondary | ICD-10-CM | POA: Insufficient documentation

## 2016-10-21 DIAGNOSIS — E785 Hyperlipidemia, unspecified: Secondary | ICD-10-CM | POA: Diagnosis not present

## 2016-10-21 DIAGNOSIS — Y9384 Activity, sleeping: Secondary | ICD-10-CM | POA: Diagnosis not present

## 2016-10-21 DIAGNOSIS — F17200 Nicotine dependence, unspecified, uncomplicated: Secondary | ICD-10-CM | POA: Insufficient documentation

## 2016-10-21 DIAGNOSIS — Q780 Osteogenesis imperfecta: Secondary | ICD-10-CM | POA: Diagnosis not present

## 2016-10-21 DIAGNOSIS — S42201A Unspecified fracture of upper end of right humerus, initial encounter for closed fracture: Secondary | ICD-10-CM

## 2016-10-21 DIAGNOSIS — Z79899 Other long term (current) drug therapy: Secondary | ICD-10-CM | POA: Diagnosis not present

## 2016-10-21 DIAGNOSIS — Y92003 Bedroom of unspecified non-institutional (private) residence as the place of occurrence of the external cause: Secondary | ICD-10-CM | POA: Diagnosis not present

## 2016-10-21 MED ORDER — ONDANSETRON HCL 4 MG PO TABS: 4.0000 mg | ORAL_TABLET | Freq: Three times a day (TID) | ORAL | 0 refills | Status: DC | PRN

## 2016-10-21 MED ORDER — TRAMADOL HCL 50 MG PO TABS
50.0000 mg | ORAL_TABLET | Freq: Once | ORAL | Status: AC
  Administered 2016-10-21: 50 mg via ORAL
  Filled 2016-10-21: qty 1

## 2016-10-21 MED ORDER — TRAMADOL HCL 50 MG PO TABS: 50.0000 mg | ORAL_TABLET | Freq: Four times a day (QID) | ORAL | 0 refills | Status: DC | PRN

## 2016-10-21 NOTE — Discharge Instructions (Signed)
Read the information below.  Use the prescribed medication as directed.  Please discuss all new medications with your pharmacist.  You may return to the Emergency Department at any time for worsening condition or any new symptoms that concern you.   If you develop uncontrolled pain, weakness or numbness of the extremity, severe discoloration of the skin, or you are unable to use your hand, return to the ER for a recheck.    °

## 2016-10-21 NOTE — ED Notes (Signed)
Bed: WTR7 Expected date:  Expected time:  Means of arrival:  Comments: Triage pt

## 2016-10-21 NOTE — ED Notes (Signed)
Pt threw up her pain medication.  PA informed and pt given a prescription for zofran.

## 2016-10-21 NOTE — ED Triage Notes (Signed)
Pt reports rolled out bed and woke up on the floor with R shoulder/upper arm pain. Large bruise noted to upper arm. Has been unable to raise arm.

## 2016-10-21 NOTE — ED Provider Notes (Signed)
[facility]-EMERGENCY DEPT Provider Note   CSN: [id number] Arrival date & time: 10/21/16  1320  By signing my name below, I, Vista Minkobert Ross, attest that this documentation has been prepared under the direction and in the presence of [facility] Center[facility]ily Amyre Segundo PA-C.  Electronically Signed: Vista Minkobert Ross, ED Scribe. 10/21/16. 3:06 PM.   History   Chief Complaint Chief Complaint  Patient presents with  . Shoulder Injury   HPI HPI Comments: Rose Flakelizabeth Kobus is a 56 y.o. female with Hx of Osteogenesis imperfecta, who presents to the Emergency Department complaining of gradually worsening, right shoulder and upper arm pain s/p falling out of bed last night. Pt accidentally rolled out of bed in her sleep last night. She did not hit her head, no LOC. Since the incident occurred she reports constant pain to the shoulder with significant bruising to lateral and anterior aspects of right shoulder. She states that she has been unable to raise her right arm secondary to pain. Pt Pt also reports mild pain to her upper and mid back on the right. She does not take any blood thinners. She states that Tramadol has helped alleviate her pain successfully in the past. No chest pain, trouble breathing.  Denies any other injury.   The history is provided by the patient. No language interpreter was used.   Past Medical History:  Diagnosis Date  . Hyperlipemia   . Hypertension    Patient Active Problem List   Diagnosis Date Noted  . Hip fracture (HCC) 09/25/2015  . Fall from slip, trip, or stumble   . Left displaced femoral neck fracture [facility](HCC)    Past Surgical History:  Procedure Laterality Date  . ABDOMINAL HYSTERECTOMY    . HIP ARTHROPLASTY Left 09/26/2015   Procedure: ARTHROPLASTY BIPOLAR HIP (HEMIARTHROPLASTY);  Surgeon: Sheral Apleyimothy D Murphy, MD;  [facility];  Service: Orthopedics;  Laterality: Left;   OB History    No data available     Home Medications    Prior to Admission medications   Medication Sig Start Date  End Date Taking? Authorizing Provider  atorvastatin (LIPITOR) 10 MG tablet Take 10 mg by mouth at bedtime.    [provider]  enoxaparin (LOVENOX) 40 MG/0.4ML injection Inject 0.4 mLs (40 mg total) into the skin daily. 09/29/15 10/26/15  Darreld McleanPatel, Vishal, MD  HYDROcodone-acetaminophen (NORCO) 10-325 MG tablet Take 1/2 tablet by mouth every 4 hours as needed for moderate pain DO NOT EXCEED 4 GM OF APAP FROM ALL SOURCES 10/13/15   Sharon SellerEubanks, Jessica K, NP  Magnesium Hydroxide (MILK OF MAGNESIA PO) Take by mouth.    [provider]  methocarbamol (ROBAXIN) 500 MG tablet TAKE 1 TABLET BY MOUTH EVERY 6 HOURS AS NEEDED 10/19/15   Medina-Vargas, Monina C, NP  ondansetron (ZOFRAN) 4 MG tablet Take 1 tablet (4 mg total) by mouth every 8 (eight) hours as needed for nausea or vomiting. 10/21/16   Trixie DredgeWest, Vickie Melnik, PA-C  oxyCODONE-acetaminophen (PERCOCET) 10-325 MG tablet Take 0.5-1 tablets by mouth every 4 (four) hours. Take 1/2 tablet q4h prn for moderate pain.  Take 1 tablet q4h prn for severe pain.    [provider]  polyethylene glycol (MIRALAX / GLYCOLAX) packet Take 17 g by mouth daily as needed.    [provider]  Protein (PROCEL) POWD Take 1 scoop by mouth 2 (two) times daily.    [provider]  senna (SENOKOT) 8.6 MG TABS tablet Take 2 tablets by mouth at bedtime.    [provider]  traMADol (  ULTRAM) 50 MG tablet Take 1 tablet (50 mg total) by mouth every 6 (six) hours as needed for severe pain. 10/21/16   Trixie Dredge, PA-C   Family History History reviewed. No pertinent family history.  Social History Social History  Substance Use Topics  . Smoking status: Current Every Day Smoker    Packs/day: 1.00  . Smokeless tobacco: Not on file  . Alcohol use 4.2 oz/week    7 Cans of beer per week   Allergies   Codeine  Review of Systems Review of Systems  Respiratory: Negative for shortness of breath.   Musculoskeletal: Positive for arthralgias (right  shoulder) and joint swelling (right shoulder).       No chest wall pain  Skin: Positive for color change (bruising to right shoulder).  Hematological: Does not bruise/bleed easily.   Physical Exam Updated Vital Signs BP (!) 141/96 (BP Location: Left Arm)   Pulse (!) 117   Temp 98.4 F (36.9 C) (Oral)   Resp 18   SpO2 100%   Physical Exam  Constitutional: She appears well-developed and well-nourished. No distress.  Sitting upright in a wheelchair.  HENT:  Head: Normocephalic and atraumatic.  Eyes: Conjunctivae are normal.  Neck: Normal range of motion. Neck supple.  Cardiovascular: Normal rate.   Pulmonary/Chest: Effort normal. She exhibits no tenderness.  Abdominal: Soft. She exhibits no distension and no mass. There is no tenderness. There is no rebound and no guarding.  Musculoskeletal: She exhibits edema and tenderness. She exhibits no deformity.  Right upper arm with large area of edema and ecchymosis, TTP, no distal tenderness, grip strength is normal. Distal pulses and sensation intact. Right posterior rib tenderness. Small area of ecchymosis. No other focal bony tenderness on exam.  Neurological: She is alert. She exhibits normal muscle tone.  Skin: She is not diaphoretic.  Psychiatric: She has a normal mood and affect. Her behavior is normal.  Nursing note and vitals reviewed.  ED Treatments / Results  DIAGNOSTIC STUDIES: Oxygen Saturation is 100% on RA, normal by my interpretation.  COORDINATION OF CARE: 3:07 PM-Discussed treatment plan with pt at bedside and pt agreed to plan.   Labs (all labs ordered are listed, but only abnormal results are displayed) Labs Reviewed - No data to display  EKG  EKG Interpretation None       Radiology Dg Ribs Unilateral W/chest Right  Result Date: 10/21/2016 CLINICAL DATA:  Larey Seat out of bed last night with right shoulder and arm pain. EXAM: RIGHT RIBS AND CHEST - 3+ VIEW COMPARISON:  Chest x-ray 09/25/2015 FINDINGS: Lungs  are clear. Cardiomediastinal silhouette is within normal. Evidence patient's known right humeral neck/head fracture. No definite right rib fracture. IMPRESSION: No acute cardiopulmonary disease. Known right humeral neck/head fracture. Electronically Signed   By: Elberta Fortis M.D.   On: 10/21/2016 15:37   Dg Shoulder Right  Result Date: 10/21/2016 CLINICAL DATA:  Right shoulder pain secondary to a fall from bed today. Bruising. EXAM: RIGHT SHOULDER - 2+ VIEW COMPARISON:  None. FINDINGS: There is a comminuted angulated fracture of the subcapital region of the proximal right humerus. No dislocation. IMPRESSION: Comminuted proximal right humeral fracture. Electronically Signed   By: Francene Boyers M.D.   On: 10/21/2016 14:32   Dg Humerus Right  Result Date: 10/21/2016 CLINICAL DATA:  Pt reports rolled out bed and woke up on the floor with R shoulder/upper arm pain. Large bruise noted to upper arm. Has been unable to raise arm. EXAM: RIGHT HUMERUS -  2+ VIEW COMPARISON:  None. FINDINGS: Comminuted impaction fracture of the humeral head.  No dislocation. IMPRESSION: Comminuted impaction fraction of the humeral head Electronically Signed   By: Genevive Bi M.D.   On: 10/21/2016 14:32    Procedures Procedures (including critical care time)  Medications Ordered in ED Medications  traMADol (ULTRAM) tablet 50 mg (50 mg Oral Given 10/21/16 1545)     Initial Impression / Assessment and Plan / ED Course  I have reviewed the triage vital signs and the nursing notes.  Pertinent labs & imaging results that were available during my care of the patient were reviewed by me and considered in my medical decision making (see chart for details).     Afebrile, nontoxic patient with injury to her right upper arm while accidentally falling out of bed.   Xray demonstrates proximal humerus fracture.  Reviewed imaging and treatment with Dr Donnald Garre.   D/C home with sling, pain medication, orthopedic follow up.   Discussed result, findings, treatment, and follow up  with patient.  Pt given return precautions.  Pt verbalizes understanding and agrees with plan.      Final Clinical Impressions(s) / ED Diagnoses   Final diagnoses:  Closed fracture of proximal end of right humerus, unspecified fracture morphology, initial encounter    New Prescriptions Discharge Medication List as of 10/21/2016  4:08 PM    START taking these medications   Details  ondansetron (ZOFRAN) 4 MG tablet Take 1 tablet (4 mg total) by mouth every 8 (eight) hours as needed for nausea or vomiting., Starting Sun 10/21/2016, Print    traMADol (ULTRAM) 50 MG tablet Take 1 tablet (50 mg total) by mouth every 6 (six) hours as needed for severe pain., Starting Sun 10/21/2016, Print        I personally performed the services described in this documentation, which was scribed in my presence. The recorded information has been reviewed and is accurate.     Trixie Dredge, PA-C 10/21/16 1631    Arby Barrette, MD 11/05/16 [id number]

## 2016-10-22 ENCOUNTER — Other Ambulatory Visit: Payer: Self-pay | Admitting: Orthopedic Surgery

## 2016-10-22 DIAGNOSIS — M25511 Pain in right shoulder: Secondary | ICD-10-CM

## 2016-10-26 ENCOUNTER — Ambulatory Visit
Admission: RE | Admit: 2016-10-26 | Discharge: 2016-10-26 | Disposition: A | Discharge status: Home / Self Care | Payer: BLUE CROSS/BLUE SHIELD | Source: Ambulatory Visit | Attending: Orthopedic Surgery | Admitting: Orthopedic Surgery

## 2016-10-26 DIAGNOSIS — M25511 Pain in right shoulder: Secondary | ICD-10-CM

## 2016-11-05 ENCOUNTER — Encounter (HOSPITAL_COMMUNITY): Payer: Self-pay | Admitting: Emergency Medicine

## 2016-11-05 ENCOUNTER — Emergency Department (HOSPITAL_COMMUNITY): Payer: BLUE CROSS/BLUE SHIELD

## 2016-11-05 ENCOUNTER — Other Ambulatory Visit: Payer: Self-pay

## 2016-11-05 ENCOUNTER — Inpatient Hospital Stay (HOSPITAL_COMMUNITY)
Admission: EM | Admit: 2016-11-05 | Discharge: 2016-11-09 | DRG: 982 | Disposition: A | Discharge status: Home / Self Care | Payer: BLUE CROSS/BLUE SHIELD | Attending: Family Medicine | Admitting: Family Medicine

## 2016-11-05 DIAGNOSIS — W06XXXA Fall from bed, initial encounter: Secondary | ICD-10-CM | POA: Diagnosis present

## 2016-11-05 DIAGNOSIS — W19XXXA Unspecified fall, initial encounter: Secondary | ICD-10-CM | POA: Diagnosis not present

## 2016-11-05 DIAGNOSIS — R000 Tachycardia, unspecified: Secondary | ICD-10-CM

## 2016-11-05 DIAGNOSIS — R55 Syncope and collapse: Secondary | ICD-10-CM | POA: Diagnosis not present

## 2016-11-05 DIAGNOSIS — Z8249 Family history of ischemic heart disease and other diseases of the circulatory system: Secondary | ICD-10-CM

## 2016-11-05 DIAGNOSIS — Z885 Allergy status to narcotic agent status: Secondary | ICD-10-CM

## 2016-11-05 DIAGNOSIS — Z90710 Acquired absence of both cervix and uterus: Secondary | ICD-10-CM

## 2016-11-05 DIAGNOSIS — S42201A Unspecified fracture of upper end of right humerus, initial encounter for closed fracture: Secondary | ICD-10-CM | POA: Diagnosis present

## 2016-11-05 DIAGNOSIS — Z419 Encounter for procedure for purposes other than remedying health state, unspecified: Secondary | ICD-10-CM

## 2016-11-05 DIAGNOSIS — R569 Unspecified convulsions: Secondary | ICD-10-CM | POA: Diagnosis not present

## 2016-11-05 DIAGNOSIS — E785 Hyperlipidemia, unspecified: Secondary | ICD-10-CM | POA: Diagnosis present

## 2016-11-05 DIAGNOSIS — F17210 Nicotine dependence, cigarettes, uncomplicated: Secondary | ICD-10-CM | POA: Diagnosis present

## 2016-11-05 DIAGNOSIS — R001 Bradycardia, unspecified: Secondary | ICD-10-CM | POA: Diagnosis present

## 2016-11-05 DIAGNOSIS — M810 Age-related osteoporosis without current pathological fracture: Secondary | ICD-10-CM | POA: Diagnosis present

## 2016-11-05 DIAGNOSIS — R4020 Unspecified coma: Secondary | ICD-10-CM | POA: Diagnosis present

## 2016-11-05 DIAGNOSIS — Q780 Osteogenesis imperfecta: Secondary | ICD-10-CM

## 2016-11-05 DIAGNOSIS — Z823 Family history of stroke: Secondary | ICD-10-CM

## 2016-11-05 DIAGNOSIS — W010XXA Fall on same level from slipping, tripping and stumbling without subsequent striking against object, initial encounter: Secondary | ICD-10-CM | POA: Diagnosis present

## 2016-11-05 DIAGNOSIS — F1011 Alcohol abuse, in remission: Secondary | ICD-10-CM | POA: Diagnosis present

## 2016-11-05 DIAGNOSIS — I10 Essential (primary) hypertension: Secondary | ICD-10-CM | POA: Diagnosis present

## 2016-11-05 DIAGNOSIS — S42291A Other displaced fracture of upper end of right humerus, initial encounter for closed fracture: Secondary | ICD-10-CM | POA: Diagnosis present

## 2016-11-05 DIAGNOSIS — G43909 Migraine, unspecified, not intractable, without status migrainosus: Secondary | ICD-10-CM | POA: Diagnosis present

## 2016-11-05 DIAGNOSIS — Z96642 Presence of left artificial hip joint: Secondary | ICD-10-CM | POA: Diagnosis present

## 2016-11-05 HISTORY — DX: Osteogenesis imperfecta: Q78.0

## 2016-11-05 HISTORY — DX: Age-related osteoporosis without current pathological fracture: M81.0

## 2016-11-05 LAB — CBC WITH DIFFERENTIAL/PLATELET
BASOS ABS: 0 10*3/uL (ref 0.0–0.1)
Basophils Relative: 0 %
EOS PCT: 1 %
Eosinophils Absolute: 0.1 10*3/uL (ref 0.0–0.7)
HEMATOCRIT: 43.7 % (ref 36.0–46.0)
Hemoglobin: 14.5 g/dL (ref 12.0–15.0)
Lymphocytes Relative: 15 %
Lymphs Abs: 1.5 10*3/uL (ref 0.7–4.0)
MCH: 37.4 pg — ABNORMAL HIGH (ref 26.0–34.0)
MCHC: 33.2 g/dL (ref 30.0–36.0)
MCV: 112.6 fL — AB (ref 78.0–100.0)
MONO ABS: 0.7 10*3/uL (ref 0.1–1.0)
MONOS PCT: 7 %
NEUTROS PCT: 77 %
Neutro Abs: 7.8 10*3/uL — ABNORMAL HIGH (ref 1.7–7.7)
PLATELETS: 265 10*3/uL (ref 150–400)
RBC: 3.88 MIL/uL (ref 3.87–5.11)
RDW: 14.3 % (ref 11.5–15.5)
WBC: 10.1 10*3/uL (ref 4.0–10.5)

## 2016-11-05 LAB — COMPREHENSIVE METABOLIC PANEL
ALT: 25 U/L (ref 14–54)
AST: 56 U/L — ABNORMAL HIGH (ref 15–41)
Albumin: 3.3 g/dL — ABNORMAL LOW (ref 3.5–5.0)
Alkaline Phosphatase: 141 U/L — ABNORMAL HIGH (ref 38–126)
Anion gap: 13 (ref 5–15)
BUN: 10 mg/dL (ref 6–20)
CHLORIDE: 96 mmol/L — AB (ref 101–111)
CO2: 30 mmol/L (ref 22–32)
Calcium: 8.9 mg/dL (ref 8.9–10.3)
Creatinine, Ser: 0.64 mg/dL (ref 0.44–1.00)
Glucose, Bld: 105 mg/dL — ABNORMAL HIGH (ref 65–99)
POTASSIUM: 3.6 mmol/L (ref 3.5–5.1)
Sodium: 139 mmol/L (ref 135–145)
Total Bilirubin: 1.8 mg/dL — ABNORMAL HIGH (ref 0.3–1.2)
Total Protein: 6.4 g/dL — ABNORMAL LOW (ref 6.5–8.1)

## 2016-11-05 LAB — I-STAT TROPONIN, ED
TROPONIN I, POC: 0.01 ng/mL (ref 0.00–0.08)
Troponin i, poc: 0.01 ng/mL (ref 0.00–0.08)

## 2016-11-05 LAB — RAPID URINE DRUG SCREEN, HOSP PERFORMED
Amphetamines: NOT DETECTED
BARBITURATES: NOT DETECTED
Benzodiazepines: NOT DETECTED
COCAINE: NOT DETECTED
Opiates: NOT DETECTED
Tetrahydrocannabinol: NOT DETECTED

## 2016-11-05 LAB — URINALYSIS, ROUTINE W REFLEX MICROSCOPIC
BILIRUBIN URINE: NEGATIVE
Glucose, UA: NEGATIVE mg/dL
Hgb urine dipstick: NEGATIVE
Ketones, ur: 20 mg/dL — AB
Leukocytes, UA: NEGATIVE
NITRITE: NEGATIVE
PROTEIN: NEGATIVE mg/dL
SPECIFIC GRAVITY, URINE: 1.017 (ref 1.005–1.030)
pH: 8 (ref 5.0–8.0)

## 2016-11-05 LAB — ETHANOL: Alcohol, Ethyl (B): <5 mg/dL (ref <5)

## 2016-11-05 LAB — D-DIMER, QUANTITATIVE (NOT AT ARMC): D DIMER QUANT: 2.62 ug{FEU}/mL — AB (ref 0.00–0.50)

## 2016-11-05 MED ORDER — ADULT MULTIVITAMIN W/MINERALS CH
1.0000 | ORAL_TABLET | Freq: Every day | ORAL | Status: DC
  Administered 2016-11-07 – 2016-11-09 (×3): 1 via ORAL
  Filled 2016-11-05 (×3): qty 1

## 2016-11-05 MED ORDER — AMLODIPINE BESYLATE 10 MG PO TABS
10.0000 mg | ORAL_TABLET | Freq: Every day | ORAL | Status: DC
Start: 2016-11-06 — End: 2016-11-09
  Administered 2016-11-07 – 2016-11-09 (×3): 10 mg via ORAL
  Filled 2016-11-05 (×3): qty 1

## 2016-11-05 MED ORDER — HEPARIN SODIUM (PORCINE) 5000 UNIT/ML IJ SOLN
5000.0000 [IU] | Freq: Three times a day (TID) | INTRAMUSCULAR | Status: DC
  Administered 2016-11-06 – 2016-11-09 (×8): 5000 [IU] via SUBCUTANEOUS
  Filled 2016-11-05 (×8): qty 1

## 2016-11-05 MED ORDER — ONDANSETRON HCL 4 MG/2ML IJ SOLN
4.0000 mg | Freq: Four times a day (QID) | INTRAMUSCULAR | Status: DC | PRN
  Administered 2016-11-06 – 2016-11-07 (×3): 4 mg via INTRAVENOUS
  Filled 2016-11-05 (×4): qty 2

## 2016-11-05 MED ORDER — VITAMIN B-1 100 MG PO TABS
100.0000 mg | ORAL_TABLET | Freq: Every day | ORAL | Status: DC
  Administered 2016-11-07 – 2016-11-09 (×3): 100 mg via ORAL
  Filled 2016-11-05 (×3): qty 1

## 2016-11-05 MED ORDER — SODIUM CHLORIDE 0.9 % IV BOLUS (SEPSIS)
1000.0000 mL | Freq: Once | INTRAVENOUS | Status: AC
  Administered 2016-11-05: 1000 mL via INTRAVENOUS

## 2016-11-05 MED ORDER — FOLIC ACID 1 MG PO TABS
1.0000 mg | ORAL_TABLET | Freq: Every day | ORAL | Status: DC
  Administered 2016-11-07 – 2016-11-09 (×3): 1 mg via ORAL
  Filled 2016-11-05 (×3): qty 1

## 2016-11-05 MED ORDER — TRANEXAMIC ACID 1000 MG/10ML IV SOLN
1000.0000 mg | INTRAVENOUS | Status: DC
  Filled 2016-11-05: qty 10

## 2016-11-05 MED ORDER — TRAMADOL HCL 50 MG PO TABS
50.0000 mg | ORAL_TABLET | Freq: Four times a day (QID) | ORAL | Status: DC | PRN
  Administered 2016-11-05 – 2016-11-07 (×4): 50 mg via ORAL
  Filled 2016-11-05 (×4): qty 1

## 2016-11-05 MED ORDER — LORAZEPAM 2 MG/ML IJ SOLN
1.0000 mg | Freq: Four times a day (QID) | INTRAMUSCULAR | Status: AC | PRN
  Filled 2016-11-05: qty 1

## 2016-11-05 MED ORDER — ATORVASTATIN CALCIUM 40 MG PO TABS
40.0000 mg | ORAL_TABLET | Freq: Every day | ORAL | Status: DC
  Administered 2016-11-07 – 2016-11-09 (×3): 40 mg via ORAL
  Filled 2016-11-05 (×3): qty 1

## 2016-11-05 MED ORDER — THIAMINE HCL 100 MG/ML IJ SOLN
100.0000 mg | Freq: Every day | INTRAMUSCULAR | Status: DC
  Administered 2016-11-06: 100 mg via INTRAVENOUS

## 2016-11-05 MED ORDER — LORAZEPAM 1 MG PO TABS
1.0000 mg | ORAL_TABLET | Freq: Four times a day (QID) | ORAL | Status: AC | PRN
  Administered 2016-11-06: 1 mg via ORAL
  Filled 2016-11-05: qty 1

## 2016-11-05 MED ORDER — SODIUM CHLORIDE 0.9% FLUSH
3.0000 mL | Freq: Two times a day (BID) | INTRAVENOUS | Status: DC
  Administered 2016-11-06 – 2016-11-08 (×5): 3 mL via INTRAVENOUS

## 2016-11-05 NOTE — ED Provider Notes (Signed)
7211:10225 PM 56 year old female presents to the emergency department complaining of multiple falls over the past 2 weeks. She states that she will "go to bed and wake up on the floor". She is unable to articulate how she ends up out of bed. She states that she is unable to recall rolling out of bed on most of these occasions. She denies specific lightheadedness and dizziness preceding each episode. She further denies associated chest pain or shortness of breath. She does report daily alcohol use, but states that it is a small amount of liquor that she drinks daily. She denies any increase in alcohol consumption. It is possible that tachycardia may be secondary to mild ETOH withdrawal; however, the patient is not tremulous and does not have significant hypertension.  Patient with occasional oxygen saturations below 95%. A D-dimer was ordered by Harolyn RutherfordShawn Joy, PA-C which is positive at 2.6. Unable to obtain CT angiogram as patient has difficult IV access. She may require nuclear scan in the morning to r/o PE. Patient also slated for surgery tomorrow. Given her reports of frequent falls of unclear etiology, further syncopal workup is warranted. Anticipate echocardiogram in the morning as well. [facility]   Vitals:   11/05/16 2015 11/05/16 2030 11/05/16 2045 11/05/16 2100  BP: (!) 143/66 134/74 (!) 141/73 138/69  Pulse: 100 (!) 102 (!) 103 (!) 102  Resp: 17 20 11  (!) 21  Temp:      TempSrc:      SpO2: 95% 94% 99% 93%  Weight:      Height:          Antony MaduraHumes, Leeah Politano, PA-C 11/05/16 2332    Vanetta MuldersZackowski, Scott, MD 11/06/16 0003

## 2016-11-05 NOTE — H&P (Addendum)
History and Physical    Rose Pitter ZOX:[id number] DOB: [birth year] DOA: 11/05/2016  PCP: Patient, No Pcp Per  Patient coming from: Home  I have personally briefly reviewed patient's old medical records in [facility]  Chief Complaint: Fall, shoulder pain  HPI: Rose Bridges is a 56 y.o. female with medical history significant of OI, HTN.  Patient recently woke up on floor (syncope vs fall vs rolled out of bed), thinking she was in bed a couple of weeks ago.  Had broken humerus.  Was originally scheduled for elective repair tomorrow.  This AM once again woke up on floor and presents to ED.   ED Course: No new fx, but EDP concerned about D.Dimer of 2.6, mild tachycardia, and wants admission for "syncope" work up.   Review of Systems: As per HPI otherwise 10 point review of systems negative.   Past Medical History:  Diagnosis Date  . Hyperlipemia   . Hypertension   . Osteogenesis imperfecta   . Osteoporosis     Past Surgical History:  Procedure Laterality Date  . ABDOMINAL HYSTERECTOMY    . HIP ARTHROPLASTY Left 09/26/2015   Procedure: ARTHROPLASTY BIPOLAR HIP (HEMIARTHROPLASTY);  Surgeon: Sheral Apley, MD;  Location[facility] OR;  Service: Orthopedics;  Laterality: Left;     reports that she has been smoking.  She has been smoking about 1.00 pack per day. She does not have any smokeless tobacco history on file. She reports that she drinks about 4.2 oz of alcohol per week . She reports that she does not use drugs.  Allergies  Allergen Reactions  . Codeine     "migraines"     Family History  Problem Relation Age of Onset  . Stroke Mother   . Heart attack Mother   . Stroke Sister   . Stroke Brother      Prior to Admission medications   Medication Sig Start Date End Date Taking? Authorizing Provider  amLODipine (NORVASC) 10 MG tablet Take 10 mg by mouth daily. 11/03/16  Yes [provider]  atorvastatin (LIPITOR) 40 MG tablet Take 40 mg by mouth  daily. 11/03/16  Yes [provider]  ondansetron (ZOFRAN) 4 MG tablet Take 1 tablet (4 mg total) by mouth every 8 (eight) hours as needed for nausea or vomiting. 10/21/16  Yes West, Emily, PA-C  traMADol (ULTRAM) 50 MG tablet Take 1 tablet (50 mg total) by mouth every 6 (six) hours as needed for severe pain. 10/21/16  Yes Trixie Dredge, [place]    Physical Exam: Vitals:   11/05/16 2045 11/05/16 2100 11/05/16 2230 11/05/16 2315  BP: (!) 141/73 138/69 (!) 141/81 134/75  Pulse: (!) 103 (!) 102 98 98  Resp: 11 (!) 21 18 19   Temp:      TempSrc:      SpO2: 99% 93% 94% 96%  Weight:      Height:        Constitutional: NAD, calm, comfortable Eyes: PERRL, lids and conjunctivae normal ENMT: Mucous membranes are moist. Posterior pharynx clear of any exudate or lesions.Normal dentition.  Neck: normal, supple, no masses, no thyromegaly Respiratory: clear to auscultation bilaterally, no wheezing, no crackles. Normal respiratory effort. No accessory muscle use.  Cardiovascular: Regular rate and rhythm, no murmurs / rubs / gallops. No extremity edema. 2+ pedal pulses. No carotid bruits.  Abdomen: no tenderness, no masses palpated. No hepatosplenomegaly. Bowel sounds positive.  Musculoskeletal: R arm in sling Skin: no rashes, lesions, ulcers. No induration Neurologic: CN 2-12 grossly  intact. Sensation intact, DTR normal. Strength 5/5 in all 4.  Psychiatric: Normal judgment and insight. Alert and oriented x 3. Normal mood.    Labs on Admission: I have personally reviewed following labs and imaging studies  CBC:  Recent Labs Lab 11/05/16 1715  WBC 10.1  NEUTROABS 7.8*  HGB 14.5  HCT 43.7  MCV 112.6*  PLT 265   Basic Metabolic Panel:  Recent Labs Lab 11/05/16 1715  NA 139  K 3.6  CL 96*  CO2 30  GLUCOSE 105*  BUN 10  CREATININE 0.64  CALCIUM 8.9   GFR: Estimated Creatinine Clearance: 72.6 mL/min (by C-G formula based on SCr of 0.64 mg/dL). Liver Function Tests:  Recent  Labs Lab 11/05/16 1715  AST 56*  ALT 25  ALKPHOS 141*  BILITOT 1.8*  PROT 6.4*  ALBUMIN 3.3*   No results for input(s): LIPASE, AMYLASE in the last 168 hours. No results for input(s): AMMONIA in the last 168 hours. Coagulation Profile: No results for input(s): INR, PROTIME in the last 168 hours. Cardiac Enzymes: No results for input(s): CKTOTAL, CKMB, CKMBINDEX, TROPONINI in the last 168 hours. BNP (last 3 results) No results for input(s): PROBNP in the last 8760 hours. HbA1C: No results for input(s): HGBA1C in the last 72 hours. CBG: No results for input(s): GLUCAP in the last 168 hours. Lipid Profile: No results for input(s): CHOL, HDL, LDLCALC, TRIG, CHOLHDL, LDLDIRECT in the last 72 hours. Thyroid Function Tests: No results for input(s): TSH, T4TOTAL, FREET4, T3FREE, THYROIDAB in the last 72 hours. Anemia Panel: No results for input(s): VITAMINB12, FOLATE, FERRITIN, TIBC, IRON, RETICCTPCT in the last 72 hours. Urine analysis:    Component Value Date/Time   COLORURINE YELLOW 11/05/2016 1631   APPEARANCEUR CLEAR 11/05/2016 1631   LABSPEC 1.017 11/05/2016 1631   PHURINE 8.0 11/05/2016 1631   GLUCOSEU NEGATIVE 11/05/2016 1631   HGBUR NEGATIVE 11/05/2016 1631   BILIRUBINUR NEGATIVE 11/05/2016 1631   KETONESUR 20 (A) 11/05/2016 1631   PROTEINUR NEGATIVE 11/05/2016 1631   NITRITE NEGATIVE 11/05/2016 1631   LEUKOCYTESUR NEGATIVE 11/05/2016 1631    Radiological Exams on Admission: Dg Ribs Unilateral W/chest Left  Result Date: 11/05/2016 CLINICAL DATA:  Patient fell.  Pain. EXAM: LEFT RIBS AND CHEST - 3+ VIEW COMPARISON:  Chest radiograph 10/21/2016. FINDINGS: No fracture or other bone lesions are seen involving the ribs. There is no evidence of pneumothorax or pleural effusion. Both lungs are clear. Heart size and mediastinal contours are within normal limits. Incompletely visualized RIGHT shoulder fracture occurred in June. Soft tissue calcification in both breasts.  IMPRESSION: Negative for rib fracture or pneumothorax. Electronically Signed   By: Elsie StainJohn T Curnes M.D.   On: 11/05/2016 15:57   Dg Pelvis 1-2 Views  Result Date: 11/05/2016 CLINICAL DATA:  Fall from bed, on floor for 5 hours. EXAM: LEFT FEMUR 2 VIEWS; PELVIS - 1-2 VIEW COMPARISON:  LEFT pelvic radiograph Sep 26, 2015 FINDINGS: Femoral heads are well formed and located. Status post LEFT hip hemiarthroplasty with intact well-seated non cemented hardware. No periprosthetic lucency. Hip joint spaces are intact. Sacroiliac joints are symmetric. No destructive bony lesions. Included soft tissue planes are non-suspicious. Phleboliths project in the pelvis. IMPRESSION: No acute fracture deformity or dislocation. Status post LEFT hip hemiarthroplasty, no radiographic findings of hardware failure. Electronically Signed   By: Awilda Metroourtnay  Bloomer M.D.   On: 11/05/2016 15:56   Ct Head Wo Contrast  Result Date: 11/05/2016 CLINICAL DATA:  Pt states she believes she rolled out of  bed and was on the floor for 5 hours. This is the second time she has fallen out of bed in 2 weeks EXAM: CT HEAD WITHOUT CONTRAST CT CERVICAL SPINE WITHOUT CONTRAST TECHNIQUE: Multidetector CT imaging of the head and cervical spine was performed following the standard protocol without intravenous contrast. Multiplanar CT image reconstructions of the cervical spine were also generated. COMPARISON:  None. FINDINGS: CT HEAD FINDINGS Brain: There small remote lacunar infarcts in the basal ganglia bilaterally. There is no intra or extra-axial fluid collection or mass lesion. The basilar cisterns and ventricles have a normal appearance. There is no CT evidence for acute infarction or hemorrhage. Vascular: No hyperdense vessel or unexpected calcification. Skull: Normal. Negative for fracture or focal lesion. Sinuses/Orbits: There is mucoperiosteal thickening of the paranasal sinuses. Orbits are intact. Mastoid air cells are normally aerated. Other: Possible  odontogenic cysts versus mucous retention cysts in the maxillary sinuses bilaterally. These are only partially imaged. CT CERVICAL SPINE FINDINGS Alignment: Normal. Skull base and vertebrae: No acute fracture. No primary bone lesion or focal pathologic process. Soft tissues and spinal canal: No prevertebral fluid or swelling. No visible canal hematoma. Disc levels:  Disc height loss primarily at C6-7. Upper chest: Unremarkable. Other: None IMPRESSION: 1.  No evidence for acute intracranial abnormality. 2. Remote lacunar infarcts of bilateral basal ganglia. 3.  No evidence for acute cervical spine abnormality. 4. Partially imaged odontogenic versus mucous retention cysts within the maxillary sinuses bilaterally. Electronically Signed   By: Norva Pavlov M.D.   On: 11/05/2016 16:40   Ct Cervical Spine Wo Contrast  Result Date: 11/05/2016 CLINICAL DATA:  Pt states she believes she rolled out of bed and was on the floor for 5 hours. This is the second time she has fallen out of bed in 2 weeks EXAM: CT HEAD WITHOUT CONTRAST CT CERVICAL SPINE WITHOUT CONTRAST TECHNIQUE: Multidetector CT imaging of the head and cervical spine was performed following the standard protocol without intravenous contrast. Multiplanar CT image reconstructions of the cervical spine were also generated. COMPARISON:  None. FINDINGS: CT HEAD FINDINGS Brain: There small remote lacunar infarcts in the basal ganglia bilaterally. There is no intra or extra-axial fluid collection or mass lesion. The basilar cisterns and ventricles have a normal appearance. There is no CT evidence for acute infarction or hemorrhage. Vascular: No hyperdense vessel or unexpected calcification. Skull: Normal. Negative for fracture or focal lesion. Sinuses/Orbits: There is mucoperiosteal thickening of the paranasal sinuses. Orbits are intact. Mastoid air cells are normally aerated. Other: Possible odontogenic cysts versus mucous retention cysts in the maxillary sinuses  bilaterally. These are only partially imaged. CT CERVICAL SPINE FINDINGS Alignment: Normal. Skull base and vertebrae: No acute fracture. No primary bone lesion or focal pathologic process. Soft tissues and spinal canal: No prevertebral fluid or swelling. No visible canal hematoma. Disc levels:  Disc height loss primarily at C6-7. Upper chest: Unremarkable. Other: None IMPRESSION: 1.  No evidence for acute intracranial abnormality. 2. Remote lacunar infarcts of bilateral basal ganglia. 3.  No evidence for acute cervical spine abnormality. 4. Partially imaged odontogenic versus mucous retention cysts within the maxillary sinuses bilaterally. Electronically Signed   By: Norva Pavlov M.D.   On: 11/05/2016 16:40   Dg Shoulder Left  Result Date: 11/05/2016 CLINICAL DATA:  Fall.  Pain. EXAM: LEFT SHOULDER - 2+ VIEW COMPARISON:  None. FINDINGS: There is no evidence of fracture or dislocation. There is no evidence of arthropathy or other focal bone abnormality. Soft tissues are unremarkable.  IMPRESSION: Negative. Electronically Signed   By: Kennith Center M.D.   On: 11/05/2016 15:57   Dg Humerus Left  Result Date: 11/05/2016 CLINICAL DATA:  Fall. EXAM: LEFT HUMERUS - 2+ VIEW COMPARISON:  None. FINDINGS: Bones are diffusely demineralized. No evidence for humerus fracture. No worrisome lytic or sclerotic osseous abnormality. IMPRESSION: Negative. Electronically Signed   By: Kennith Center M.D.   On: 11/05/2016 15:56   Dg Femur Min 2 Views Left  Result Date: 11/05/2016 CLINICAL DATA:  Fall from bed, on floor for 5 hours. EXAM: LEFT FEMUR 2 VIEWS; PELVIS - 1-2 VIEW COMPARISON:  LEFT pelvic radiograph Sep 26, 2015 FINDINGS: Femoral heads are well formed and located. Status post LEFT hip hemiarthroplasty with intact well-seated non cemented hardware. No periprosthetic lucency. Hip joint spaces are intact. Sacroiliac joints are symmetric. No destructive bony lesions. Included soft tissue planes are non-suspicious.  Phleboliths project in the pelvis. IMPRESSION: No acute fracture deformity or dislocation. Status post LEFT hip hemiarthroplasty, no radiographic findings of hardware failure. Electronically Signed   By: Awilda Metro M.D.   On: 11/05/2016 15:56    EKG: Independently reviewed.  Assessment/Plan Principal Problem:   Syncope Active Problems:   Fall from slip, trip, or stumble    1. Fall / possible syncope - 1. VQ scan ordered (unable to get IV for CTA) 2. Syncope pathway 3. Tele monitor 4. 2d echo 5. Obviously may delay surgery 2. H/o EtOH use - 1. CIWA 2. Also probably not a bad idea to give empiric thiamine and folate given the macrocytosis on CBC.  DVT prophylaxis: Heparin [place] Code Status: Full Family Communication: No family in room Disposition Plan: Home after admit Consults called: None Admission status: Place in obs   Omare Bilotta, Heywood Iles. DO [facility] Pager [phone]  If 7AM-7PM, please contact day team taking care of patient [email or web address] Password [email or web address]  11/05/2016, 11:52 PM

## 2016-11-05 NOTE — ED Notes (Signed)
IV team states unable to obtain a 20G on this patient.  PA aware of inability to scan at this time.  Will defer to admitting.

## 2016-11-05 NOTE — ED Notes (Signed)
This RN attempted phlebotomy stick without success.  Called for additional assistance wen pt returns from CT.  Pt transported to CT at this time.

## 2016-11-05 NOTE — ED Notes (Signed)
Pt ambulated to restroom located in room, tolerated well. Pt is one assist to get out of bed and off of toilet.

## 2016-11-05 NOTE — ED Triage Notes (Signed)
Pt BIB EMS from home for fall. Pt states she believes she rolled out of bed and was on the floor for 5 hours. Pt seen for recent fall and comminuted break of rt shoulder, surgery scheduled for 1015 tomorrow. Pt reports feeling dizzy and believes it is from "not eating and 2 new medications" she started on Saturday, unable to recall names of meds. Denies head/neck injury or LOC.  Pt A&O x 4; Resp e/u; nad noted.

## 2016-11-05 NOTE — ED Provider Notes (Signed)
[facility]-EMER[facility]ENCY DEPT Provider Note   CSN: [id number] Arrival date & time: 11/05/16  1226     History   Chief Complaint Chief Complaint  Patient presents with  . Fall  . Shoulder Pain    HPI Rose Bridges is a 56 y.o. female.  HPI    Rose Bridges is a 56 y.o. female, with a history of Osteogenesis imperfecta, osteoporosis, and hypertension, presenting to the ED with injuries from a fall last night.   She states she woke up on the floor of the bedroom next to her bed and is not sure what happened. She woke up with left shoulder, left rib, and left femur pain. Pain is 10/10 in all locations. Has had multiple instances of waking up on the floor next to her bed with no memory of what happened. Has had frequent falls, especially over the last month or so. Has not been evaluated for this issue before.  Endorses intermittent dizziness upon standing that may have started before or after she was started on a new HTN medication four days ago, but does not know the name of it. Also endorses poor oral intake due to decreased appetite.   Patient had one of her "wake up on the floor episodes" two weeks ago that resulted in a right proximal humerus fracture. She is scheduled for surgery tomorrow with Dr. Eulah Pont. Has been taking tramadol for pain.   Is not on fosamax or hormonal therapy for treatment of her osteogenesis imperfecta or osteoporosis.  Denies fever/chills, shortness of breath, N/V/D, changes in bowel or bladder function, urinary complaints, or any other complaints.    Past Medical History:  Diagnosis Date  . Hyperlipemia   . Hypertension   . Osteogenesis imperfecta   . Osteoporosis     Patient Active Problem List   Diagnosis Date Noted  . Hip fracture (HCC) 09/25/2015  . Fall from slip, trip, or stumble   . Left displaced femoral neck fracture [facility])     Past Surgical History:  Procedure Laterality Date  . ABDOMINAL HYSTERECTOMY    . HIP ARTHROPLASTY Left  09/26/2015   Procedure: ARTHROPLASTY BIPOLAR HIP (HEMIARTHROPLASTY);  Surgeon: Sheral Apley, MD;  Location: [facility] OR;  Service: Orthopedics;  Laterality: Left;    OB History    No data available       Home Medications    Prior to Admission medications   Medication Sig Start Date End Date Taking? Authorizing Provider  amLODipine (NORVASC) 10 MG tablet Take 10 mg by mouth daily. 11/03/16  Yes [provider]  atorvastatin (LIPITOR) 40 MG tablet Take 40 mg by mouth daily. 11/03/16  Yes [provider]  ondansetron (ZOFRAN) 4 MG tablet Take 1 tablet (4 mg total) by mouth every 8 (eight) hours as needed for nausea or vomiting. 10/21/16  Yes West, Emily, PA-C  traMADol (ULTRAM) 50 MG tablet Take 1 tablet (50 mg total) by mouth every 6 (six) hours as needed for severe pain. 10/21/16  Yes Trixie Dredge, PA-C    Family History Family History  Problem Relation Age of Onset  . Stroke Mother   . Heart attack Mother   . Stroke Sister   . Stroke Brother     Social History Social History  Substance Use Topics  . Smoking status: Current Every Day Smoker    Packs/day: 1.00  . Smokeless tobacco: Not on file  . Alcohol use 4.2 oz/week    7 Cans of beer per week  Allergies   Codeine   Review of Systems Review of Systems  Constitutional: Negative for chills, diaphoresis and fever.  Respiratory: Negative for shortness of breath.   Cardiovascular: Negative for chest pain.  Gastrointestinal: Negative for abdominal pain, nausea and vomiting.  Musculoskeletal: Positive for arthralgias, back pain and neck pain. Negative for joint swelling.  Neurological: Positive for dizziness (upon standing). Negative for weakness, light-headedness, numbness and headaches.  All other systems reviewed and are negative.    Physical Exam Updated Vital Signs Temp 97.9 F (36.6 C) (Oral)   Ht 5\' 6"  (1.676 m)   Wt 63 kg (139 lb)   SpO2 99%   BMI 22.44 kg/m   Physical Exam    Constitutional: She appears well-developed and well-nourished. No distress.  HENT:  Head: Normocephalic and atraumatic.  Mouth/Throat: Oropharynx is clear and moist.  Eyes: Conjunctivae and EOM are normal. Pupils are equal, round, and reactive to light.  Neck: Normal range of motion. Neck supple.  Cardiovascular: Regular rhythm, normal heart sounds and intact distal pulses.  Tachycardia present.   Pulmonary/Chest: Effort normal and breath sounds normal. No respiratory distress.  Abdominal: Soft. There is no tenderness. There is no guarding.  Musculoskeletal: She exhibits tenderness. She exhibits no edema or deformity.  Tenderness to the lateral left humerus and left scapula. Full range of motion in the left shoulder, but pain with manipulation of the left humerus.  Right upper extremity is in an arm sling.  Tenderness to the midline cervical and lumbar spines without noted swelling, step-off, deformity, or crepitus. Ecchymosis noted to the right side of the back and right flank, likely from the patient's humeral fracture.  Tenderness to the anterior and lateral left proximal femur. Full range of motion in the left hip, knee, and ankle. Normal motor function on the right lower extremity.  Tenderness to the global left ribs both anterior and lateral without noted instability, crepitus, swelling, deformity, ecchymosis, or other abnormality.  Lymphadenopathy:    She has no cervical adenopathy.  Neurological: She is alert.  No sensory deficits. Strength 5/5 in all extremities. Ambulated without assistance. Coordination intact including heel to shin and finger to nose. Cranial nerves III-XII grossly intact. No facial droop.   Skin: Skin is warm and dry. Capillary refill takes less than 2 seconds. She is not diaphoretic.  Psychiatric: She has a normal mood and affect. Her behavior is normal.  Nursing note and vitals reviewed.    ED Treatments / Results  Labs (all labs ordered are listed,  but only abnormal results are displayed) Labs Reviewed  URINALYSIS, ROUTINE W REFLEX MICROSCOPIC - Abnormal; Notable for the following:       Result Value   Ketones, ur 20 (*)    All other components within normal limits  COMPREHENSIVE METABOLIC PANEL - Abnormal; Notable for the following:    Chloride 96 (*)    Glucose, Bld 105 (*)    Total Protein 6.4 (*)    Albumin 3.3 (*)    AST 56 (*)    Alkaline Phosphatase 141 (*)    Total Bilirubin 1.8 (*)    All other components within normal limits  CBC WITH DIFFERENTIAL/PLATELET - Abnormal; Notable for the following:    MCV 112.6 (*)    MCH 37.4 (*)    Neutro Abs 7.8 (*)    All other components within normal limits  D-DIMER, QUANTITATIVE (NOT AT [facility]) - Abnormal; Notable for the following:    D-Dimer, Quant 2.62 (*)  All other components within normal limits  RAPID URINE DRUG SCREEN, HOSP PERFORMED  ETHANOL  I-STAT TROPOININ, ED  I-STAT TROPOININ, ED    EKG  EKG Interpretation None       Radiology Dg Ribs Unilateral W/chest Left  Result Date: 11/05/2016 CLINICAL DATA:  Patient fell.  Pain. EXAM: LEFT RIBS AND CHEST - 3+ VIEW COMPARISON:  Chest radiograph 10/21/2016. FINDINGS: No fracture or other bone lesions are seen involving the ribs. There is no evidence of pneumothorax or pleural effusion. Both lungs are clear. Heart size and mediastinal contours are within normal limits. Incompletely visualized RIGHT shoulder fracture occurred in June. Soft tissue calcification in both breasts. IMPRESSION: Negative for rib fracture or pneumothorax. Electronically Signed   By: Elsie Stain M.D.   On: 11/05/2016 15:57   Dg Pelvis 1-2 Views  Result Date: 11/05/2016 CLINICAL DATA:  Fall from bed, on floor for 5 hours. EXAM: LEFT FEMUR 2 VIEWS; PELVIS - 1-2 VIEW COMPARISON:  LEFT pelvic radiograph Sep 26, 2015 FINDINGS: Femoral heads are well formed and located. Status post LEFT hip hemiarthroplasty with intact well-seated non cemented hardware.  No periprosthetic lucency. Hip joint spaces are intact. Sacroiliac joints are symmetric. No destructive bony lesions. Included soft tissue planes are non-suspicious. Phleboliths project in the pelvis. IMPRESSION: No acute fracture deformity or dislocation. Status post LEFT hip hemiarthroplasty, no radiographic findings of hardware failure. Electronically Signed   By: Awilda Metro M.D.   On: 11/05/2016 15:56   Ct Head Wo Contrast  Result Date: 11/05/2016 CLINICAL DATA:  Pt states she believes she rolled out of bed and was on the floor for 5 hours. This is the second time she has fallen out of bed in 2 weeks EXAM: CT HEAD WITHOUT CONTRAST CT CERVICAL SPINE WITHOUT CONTRAST TECHNIQUE: Multidetector CT imaging of the head and cervical spine was performed following the standard protocol without intravenous contrast. Multiplanar CT image reconstructions of the cervical spine were also generated. COMPARISON:  None. FINDINGS: CT HEAD FINDINGS Brain: There small remote lacunar infarcts in the basal ganglia bilaterally. There is no intra or extra-axial fluid collection or mass lesion. The basilar cisterns and ventricles have a normal appearance. There is no CT evidence for acute infarction or hemorrhage. Vascular: No hyperdense vessel or unexpected calcification. Skull: Normal. Negative for fracture or focal lesion. Sinuses/Orbits: There is mucoperiosteal thickening of the paranasal sinuses. Orbits are intact. Mastoid air cells are normally aerated. Other: Possible odontogenic cysts versus mucous retention cysts in the maxillary sinuses bilaterally. These are only partially imaged. CT CERVICAL SPINE FINDINGS Alignment: Normal. Skull base and vertebrae: No acute fracture. No primary bone lesion or focal pathologic process. Soft tissues and spinal canal: No prevertebral fluid or swelling. No visible canal hematoma. Disc levels:  Disc height loss primarily at C6-7. Upper chest: Unremarkable. Other: None IMPRESSION: 1.   No evidence for acute intracranial abnormality. 2. Remote lacunar infarcts of bilateral basal ganglia. 3.  No evidence for acute cervical spine abnormality. 4. Partially imaged odontogenic versus mucous retention cysts within the maxillary sinuses bilaterally. Electronically Signed   By: Norva Pavlov M.D.   On: 11/05/2016 16:40   Ct Cervical Spine Wo Contrast  Result Date: 11/05/2016 CLINICAL DATA:  Pt states she believes she rolled out of bed and was on the floor for 5 hours. This is the second time she has fallen out of bed in 2 weeks EXAM: CT HEAD WITHOUT CONTRAST CT CERVICAL SPINE WITHOUT CONTRAST TECHNIQUE: Multidetector CT imaging of the  head and cervical spine was performed following the standard protocol without intravenous contrast. Multiplanar CT image reconstructions of the cervical spine were also generated. COMPARISON:  None. FINDINGS: CT HEAD FINDINGS Brain: There small remote lacunar infarcts in the basal ganglia bilaterally. There is no intra or extra-axial fluid collection or mass lesion. The basilar cisterns and ventricles have a normal appearance. There is no CT evidence for acute infarction or hemorrhage. Vascular: No hyperdense vessel or unexpected calcification. Skull: Normal. Negative for fracture or focal lesion. Sinuses/Orbits: There is mucoperiosteal thickening of the paranasal sinuses. Orbits are intact. Mastoid air cells are normally aerated. Other: Possible odontogenic cysts versus mucous retention cysts in the maxillary sinuses bilaterally. These are only partially imaged. CT CERVICAL SPINE FINDINGS Alignment: Normal. Skull base and vertebrae: No acute fracture. No primary bone lesion or focal pathologic process. Soft tissues and spinal canal: No prevertebral fluid or swelling. No visible canal hematoma. Disc levels:  Disc height loss primarily at C6-7. Upper chest: Unremarkable. Other: None IMPRESSION: 1.  No evidence for acute intracranial abnormality. 2. Remote lacunar  infarcts of bilateral basal ganglia. 3.  No evidence for acute cervical spine abnormality. 4. Partially imaged odontogenic versus mucous retention cysts within the maxillary sinuses bilaterally. Electronically Signed   By: Norva Pavlov M.D.   On: 11/05/2016 16:40   Dg Shoulder Left  Result Date: 11/05/2016 CLINICAL DATA:  Fall.  Pain. EXAM: LEFT SHOULDER - 2+ VIEW COMPARISON:  None. FINDINGS: There is no evidence of fracture or dislocation. There is no evidence of arthropathy or other focal bone abnormality. Soft tissues are unremarkable. IMPRESSION: Negative. Electronically Signed   By: Kennith Center M.D.   On: 11/05/2016 15:57   Dg Humerus Left  Result Date: 11/05/2016 CLINICAL DATA:  Fall. EXAM: LEFT HUMERUS - 2+ VIEW COMPARISON:  None. FINDINGS: Bones are diffusely demineralized. No evidence for humerus fracture. No worrisome lytic or sclerotic osseous abnormality. IMPRESSION: Negative. Electronically Signed   By: Kennith Center M.D.   On: 11/05/2016 15:56   Dg Femur Min 2 Views Left  Result Date: 11/05/2016 CLINICAL DATA:  Fall from bed, on floor for 5 hours. EXAM: LEFT FEMUR 2 VIEWS; PELVIS - 1-2 VIEW COMPARISON:  LEFT pelvic radiograph Sep 26, 2015 FINDINGS: Femoral heads are well formed and located. Status post LEFT hip hemiarthroplasty with intact well-seated non cemented hardware. No periprosthetic lucency. Hip joint spaces are intact. Sacroiliac joints are symmetric. No destructive bony lesions. Included soft tissue planes are non-suspicious. Phleboliths project in the pelvis. IMPRESSION: No acute fracture deformity or dislocation. Status post LEFT hip hemiarthroplasty, no radiographic findings of hardware failure. Electronically Signed   By: Awilda Metro M.D.   On: 11/05/2016 15:56    Procedures Procedures (including critical care time)  Medications Ordered in ED Medications  sodium chloride 0.9 % bolus 1,000 mL (1,000 mLs Intravenous New Bag/Given 11/05/16 1918)     Initial  Impression / Assessment and Plan / ED Course  I have reviewed the triage vital signs and the nursing notes.  Pertinent labs & imaging results that were available during my care of the patient were reviewed by me and considered in my medical decision making (see chart for details).     Patient presents following a fall last night. No acute abnormalities on imaging. Patient noted to be somewhat tachycardic with orthostatic increase. This could be from her decreased oral intake, however, the tachycardia combined with the patient's complaint of chest pain, intermittent dizziness, and falls warrant some further  investigation. She doesn't know how she ends up on the floor. This type of event has happened several times and she has not been evaluated for it. Needs admission for syncope workup.   End of shift patient care handoff report given to Antony Madura, PA-C. Plan: admit for syncope workup.    Vitals:   11/05/16 1400 11/05/16 1415 11/05/16 1430 11/05/16 1500  BP: (!) 146/82 (!) 162/98 (!) 159/97 (!) 157/84  Pulse: (!) 101 (!) 107 (!) 102 98  Temp:      TempSrc:      SpO2: 93% 97% 95% 95%  Weight:      Height:       Orthostatic VS for the past 24 hrs:  BP- Lying Pulse- Lying BP- Sitting Pulse- Sitting BP- Standing at 0 minutes Pulse- Standing at 0 minutes  11/05/16 1725 (!) 149/92 93 153/83 94 (!) 147/95 114     Final Clinical Impressions(s) / ED Diagnoses   Final diagnoses:  Fall, initial encounter    New Prescriptions New Prescriptions   No medications on file     Concepcion Living 11/05/16 2125    Anselm Pancoast, PA-C 11/05/16 2126    Vanetta Mulders, MD 11/05/16 2252

## 2016-11-05 NOTE — H&P (Signed)
[facility] [address]   [address] [place], [place] [id number] [phone] A Division of [facility]                                                                RE: Rose Bridges, Rose Bridges   [phone]   04-21-2061  10-29-16 Reason for visit: Follow up for continued evaluation of right proximal humerus fracture-CT scan review.   History of present illness: This is an acute traumatic problem that began on October 20, 2016 when rolling out of bed and falling onto her right arm.   Today her pain is under better control, but feels significant pain with any movement.  She denies numbness or tingling.  She thought about non-surgical versus surgical options since previous visit and would prefer surgery if possible to maximize her ability to regain function in this arm.   She has a history of osteogenesis imperfecta and is a one pack per day smoker.    EXAMINATION: Well appearing and in no acute distress.  Right arm in sling.  Resolving significant ecchymosis of the right upper extremity and posterior shoulder.  Hand is warm with good capillary refill.  Sensation is intact distally with good grip strength.    X-RAYS: CT scan of the right humerus shows a severely comminuted fracture of the surgical neck right proximal humerus with 2 centimeters of lateral displacement.  Fracture involves the greater tuberosity with less than 10 mm of displacement.  Fracture involves the lesser tuberosity.  No articular surface involvement.    ASSESSMENT/PLAN: Discussed non-operative versus surgical intervention today, including the risks and benefits and those added by her smoking and poor bone quality.  She verbalizes understanding and would like to proceed with surgery, which would likely be ORIF of the right proximal humerus.  Dr. Eulah Bridges will review the CT scans additionally and will contact the patient with any updates to surgical planning, including the  possibility of shoulder arthroplasty.  She will plan to follow up with Dr. Eulah Bridges postoperatively.    Rose Bridges, M.D.  Dictated by: Avis EpleyH.C. Martensen, PA-C Electronically verified by Rose Baizeimothy D. Eulah Bridges, M.D. TDM(HCM):jjh D 10-30-16 T 11-01-16

## 2016-11-05 NOTE — ED Notes (Signed)
Admitting at bedside 

## 2016-11-06 ENCOUNTER — Observation Stay (HOSPITAL_COMMUNITY): Payer: BLUE CROSS/BLUE SHIELD | Admitting: Anesthesiology

## 2016-11-06 ENCOUNTER — Observation Stay (HOSPITAL_COMMUNITY): Payer: BLUE CROSS/BLUE SHIELD

## 2016-11-06 ENCOUNTER — Ambulatory Visit (HOSPITAL_COMMUNITY)
Admission: RE | Admit: 2016-11-06 | Payer: BLUE CROSS/BLUE SHIELD | Source: Ambulatory Visit | Admitting: Orthopedic Surgery

## 2016-11-06 ENCOUNTER — Other Ambulatory Visit (HOSPITAL_COMMUNITY): Payer: BLUE CROSS/BLUE SHIELD

## 2016-11-06 ENCOUNTER — Encounter (HOSPITAL_COMMUNITY): Payer: Self-pay | Admitting: Emergency Medicine

## 2016-11-06 ENCOUNTER — Encounter (HOSPITAL_COMMUNITY)
Admission: EM | Disposition: A | Discharge status: Home / Self Care | Payer: Self-pay | Source: Home / Self Care | Attending: Family Medicine

## 2016-11-06 DIAGNOSIS — S42201A Unspecified fracture of upper end of right humerus, initial encounter for closed fracture: Secondary | ICD-10-CM | POA: Diagnosis not present

## 2016-11-06 DIAGNOSIS — M810 Age-related osteoporosis without current pathological fracture: Secondary | ICD-10-CM | POA: Diagnosis present

## 2016-11-06 DIAGNOSIS — W06XXXA Fall from bed, initial encounter: Secondary | ICD-10-CM | POA: Diagnosis present

## 2016-11-06 DIAGNOSIS — I10 Essential (primary) hypertension: Secondary | ICD-10-CM | POA: Diagnosis present

## 2016-11-06 DIAGNOSIS — Z8249 Family history of ischemic heart disease and other diseases of the circulatory system: Secondary | ICD-10-CM | POA: Diagnosis not present

## 2016-11-06 DIAGNOSIS — F17210 Nicotine dependence, cigarettes, uncomplicated: Secondary | ICD-10-CM | POA: Diagnosis present

## 2016-11-06 DIAGNOSIS — R569 Unspecified convulsions: Secondary | ICD-10-CM | POA: Diagnosis present

## 2016-11-06 DIAGNOSIS — Z419 Encounter for procedure for purposes other than remedying health state, unspecified: Secondary | ICD-10-CM | POA: Diagnosis not present

## 2016-11-06 DIAGNOSIS — W19XXXA Unspecified fall, initial encounter: Secondary | ICD-10-CM | POA: Diagnosis not present

## 2016-11-06 DIAGNOSIS — R001 Bradycardia, unspecified: Secondary | ICD-10-CM | POA: Diagnosis present

## 2016-11-06 DIAGNOSIS — G43909 Migraine, unspecified, not intractable, without status migrainosus: Secondary | ICD-10-CM | POA: Diagnosis present

## 2016-11-06 DIAGNOSIS — W010XXD Fall on same level from slipping, tripping and stumbling without subsequent striking against object, subsequent encounter: Secondary | ICD-10-CM | POA: Diagnosis not present

## 2016-11-06 DIAGNOSIS — Q780 Osteogenesis imperfecta: Secondary | ICD-10-CM | POA: Diagnosis not present

## 2016-11-06 DIAGNOSIS — Z96642 Presence of left artificial hip joint: Secondary | ICD-10-CM | POA: Diagnosis present

## 2016-11-06 DIAGNOSIS — Z90710 Acquired absence of both cervix and uterus: Secondary | ICD-10-CM | POA: Diagnosis not present

## 2016-11-06 DIAGNOSIS — F1011 Alcohol abuse, in remission: Secondary | ICD-10-CM | POA: Diagnosis present

## 2016-11-06 DIAGNOSIS — I340 Nonrheumatic mitral (valve) insufficiency: Secondary | ICD-10-CM | POA: Diagnosis not present

## 2016-11-06 DIAGNOSIS — W010XXA Fall on same level from slipping, tripping and stumbling without subsequent striking against object, initial encounter: Secondary | ICD-10-CM | POA: Diagnosis not present

## 2016-11-06 DIAGNOSIS — S42291A Other displaced fracture of upper end of right humerus, initial encounter for closed fracture: Secondary | ICD-10-CM | POA: Diagnosis present

## 2016-11-06 DIAGNOSIS — S42291D Other displaced fracture of upper end of right humerus, subsequent encounter for fracture with routine healing: Secondary | ICD-10-CM | POA: Diagnosis not present

## 2016-11-06 DIAGNOSIS — E785 Hyperlipidemia, unspecified: Secondary | ICD-10-CM | POA: Diagnosis present

## 2016-11-06 DIAGNOSIS — R000 Tachycardia, unspecified: Secondary | ICD-10-CM | POA: Diagnosis not present

## 2016-11-06 DIAGNOSIS — R55 Syncope and collapse: Secondary | ICD-10-CM | POA: Diagnosis present

## 2016-11-06 DIAGNOSIS — Z885 Allergy status to narcotic agent status: Secondary | ICD-10-CM | POA: Diagnosis not present

## 2016-11-06 DIAGNOSIS — Z823 Family history of stroke: Secondary | ICD-10-CM | POA: Diagnosis not present

## 2016-11-06 HISTORY — PX: ORIF HUMERUS FRACTURE: SHX2126

## 2016-11-06 LAB — SURGICAL PCR SCREEN
MRSA, PCR: NEGATIVE
STAPHYLOCOCCUS AUREUS: NEGATIVE

## 2016-11-06 LAB — GLUCOSE, CAPILLARY: GLUCOSE-CAPILLARY: 100 mg/dL — AB (ref 65–99)

## 2016-11-06 LAB — HIV ANTIBODY (ROUTINE TESTING W REFLEX): HIV Screen 4th Generation wRfx: NONREACTIVE

## 2016-11-06 SURGERY — OPEN REDUCTION INTERNAL FIXATION (ORIF) PROXIMAL HUMERUS FRACTURE
Anesthesia: General | Laterality: Right

## 2016-11-06 MED ORDER — DOCUSATE SODIUM 100 MG PO CAPS
100.0000 mg | ORAL_CAPSULE | Freq: Two times a day (BID) | ORAL | Status: DC
  Administered 2016-11-06 – 2016-11-09 (×6): 100 mg via ORAL
  Filled 2016-11-06 (×6): qty 1

## 2016-11-06 MED ORDER — KETOROLAC TROMETHAMINE 15 MG/ML IJ SOLN: 7.5000 mg | Freq: Three times a day (TID) | INTRAMUSCULAR | Status: AC | PRN

## 2016-11-06 MED ORDER — ROCURONIUM BROMIDE 50 MG/5ML IV SOLN
INTRAVENOUS | Status: AC
  Filled 2016-11-06: qty 1

## 2016-11-06 MED ORDER — LACTATED RINGERS IV SOLN
INTRAVENOUS | Status: DC | PRN
  Administered 2016-11-06 (×2): via INTRAVENOUS

## 2016-11-06 MED ORDER — FENTANYL CITRATE (PF) 250 MCG/5ML IJ SOLN
INTRAMUSCULAR | Status: AC
  Filled 2016-11-06: qty 5

## 2016-11-06 MED ORDER — ROCURONIUM BROMIDE 100 MG/10ML IV SOLN
INTRAVENOUS | Status: DC | PRN
  Administered 2016-11-06: 50 mg via INTRAVENOUS

## 2016-11-06 MED ORDER — 0.9 % SODIUM CHLORIDE (POUR BTL) OPTIME
TOPICAL | Status: DC | PRN
  Administered 2016-11-06: 1000 mL

## 2016-11-06 MED ORDER — FENTANYL CITRATE (PF) 100 MCG/2ML IJ SOLN
INTRAMUSCULAR | Status: DC | PRN
  Administered 2016-11-06: 50 ug via INTRAVENOUS

## 2016-11-06 MED ORDER — ACETAMINOPHEN 650 MG RE SUPP: 650.0000 mg | Freq: Four times a day (QID) | RECTAL | Status: DC | PRN

## 2016-11-06 MED ORDER — SODIUM CHLORIDE 0.9 % IV SOLN
INTRAVENOUS | Status: DC | PRN
  Administered 2016-11-06: 1000 mg via INTRAVENOUS

## 2016-11-06 MED ORDER — PROPOFOL 10 MG/ML IV BOLUS
INTRAVENOUS | Status: AC
  Filled 2016-11-06: qty 20

## 2016-11-06 MED ORDER — ROPIVACAINE HCL 5 MG/ML IJ SOLN
INTRAMUSCULAR | Status: DC | PRN
  Administered 2016-11-06: 25 mL via PERINEURAL

## 2016-11-06 MED ORDER — LACTATED RINGERS IV SOLN
INTRAVENOUS | Status: DC
  Administered 2016-11-06 – 2016-11-07 (×2): via INTRAVENOUS

## 2016-11-06 MED ORDER — FENTANYL CITRATE (PF) 100 MCG/2ML IJ SOLN
50.0000 ug | Freq: Once | INTRAMUSCULAR | Status: AC
  Administered 2016-11-06: 50 ug via INTRAVENOUS
  Filled 2016-11-06: qty 1

## 2016-11-06 MED ORDER — IOPAMIDOL (ISOVUE-370) INJECTION 76%
INTRAVENOUS | Status: AC
  Administered 2016-11-06: 100 mL
  Filled 2016-11-06: qty 100

## 2016-11-06 MED ORDER — DEXAMETHASONE SODIUM PHOSPHATE 10 MG/ML IJ SOLN
INTRAMUSCULAR | Status: DC | PRN
  Administered 2016-11-06: 10 mg via INTRAVENOUS

## 2016-11-06 MED ORDER — ACETAMINOPHEN 325 MG PO TABS: 650.0000 mg | ORAL_TABLET | Freq: Four times a day (QID) | ORAL | Status: DC | PRN

## 2016-11-06 MED ORDER — MIDAZOLAM HCL 2 MG/2ML IJ SOLN
1.0000 mg | Freq: Once | INTRAMUSCULAR | Status: AC
  Administered 2016-11-06: 1 mg via INTRAVENOUS
  Filled 2016-11-06: qty 1

## 2016-11-06 MED ORDER — GABAPENTIN 300 MG PO CAPS: 300.0000 mg | ORAL_CAPSULE | Freq: Once | ORAL | Status: DC

## 2016-11-06 MED ORDER — ACETAMINOPHEN 500 MG PO TABS: 1000.0000 mg | ORAL_TABLET | Freq: Once | ORAL | Status: DC

## 2016-11-06 MED ORDER — HYDROMORPHONE HCL 1 MG/ML IJ SOLN: 0.2000 mg | INTRAMUSCULAR | Status: DC | PRN

## 2016-11-06 MED ORDER — LIDOCAINE HCL (CARDIAC) 20 MG/ML IV SOLN
INTRAVENOUS | Status: DC | PRN
  Administered 2016-11-06: 80 mg via INTRAVENOUS

## 2016-11-06 MED ORDER — OXYCODONE HCL 5 MG PO TABS
5.0000 mg | ORAL_TABLET | ORAL | Status: DC | PRN
  Administered 2016-11-06: 5 mg via ORAL
  Administered 2016-11-06 – 2016-11-09 (×16): 10 mg via ORAL
  Filled 2016-11-06 (×17): qty 2

## 2016-11-06 MED ORDER — PHENYLEPHRINE HCL 10 MG/ML IJ SOLN
INTRAMUSCULAR | Status: DC | PRN
  Administered 2016-11-06: 80 ug via INTRAVENOUS
  Administered 2016-11-06: 120 ug via INTRAVENOUS
  Administered 2016-11-06: 80 ug via INTRAVENOUS
  Administered 2016-11-06: 160 ug via INTRAVENOUS
  Administered 2016-11-06: 120 ug via INTRAVENOUS

## 2016-11-06 MED ORDER — ACETAMINOPHEN 500 MG PO TABS
1000.0000 mg | ORAL_TABLET | Freq: Three times a day (TID) | ORAL | Status: AC
  Administered 2016-11-06 – 2016-11-08 (×6): 1000 mg via ORAL
  Filled 2016-11-06 (×6): qty 2

## 2016-11-06 MED ORDER — PHENYLEPHRINE HCL 10 MG/ML IJ SOLN
INTRAVENOUS | Status: DC | PRN
  Administered 2016-11-06: 40 ug/min via INTRAVENOUS

## 2016-11-06 MED ORDER — MIDAZOLAM HCL 2 MG/2ML IJ SOLN
INTRAMUSCULAR | Status: AC
  Filled 2016-11-06: qty 2

## 2016-11-06 MED ORDER — ONDANSETRON HCL 4 MG/2ML IJ SOLN
INTRAMUSCULAR | Status: DC | PRN
  Administered 2016-11-06: 4 mg via INTRAVENOUS

## 2016-11-06 MED ORDER — LACTATED RINGERS IV SOLN: INTRAVENOUS | Status: DC

## 2016-11-06 MED ORDER — CEFAZOLIN SODIUM-DEXTROSE 1-4 GM/50ML-% IV SOLN
1.0000 g | Freq: Four times a day (QID) | INTRAVENOUS | Status: AC
  Administered 2016-11-06 – 2016-11-07 (×3): 1 g via INTRAVENOUS
  Filled 2016-11-06 (×3): qty 50

## 2016-11-06 MED ORDER — ESMOLOL HCL 100 MG/10ML IV SOLN
INTRAVENOUS | Status: DC | PRN
  Administered 2016-11-06: 20 mg via INTRAVENOUS

## 2016-11-06 MED ORDER — FENTANYL CITRATE (PF) 100 MCG/2ML IJ SOLN
25.0000 ug | INTRAMUSCULAR | Status: DC | PRN
  Administered 2016-11-06 (×2): 25 ug via INTRAVENOUS

## 2016-11-06 MED ORDER — ONDANSETRON HCL 4 MG/2ML IJ SOLN
INTRAMUSCULAR | Status: AC
  Filled 2016-11-06: qty 2

## 2016-11-06 MED ORDER — FENTANYL CITRATE (PF) 100 MCG/2ML IJ SOLN
INTRAMUSCULAR | Status: AC
  Administered 2016-11-06: 50 ug via INTRAVENOUS
  Filled 2016-11-06: qty 2

## 2016-11-06 MED ORDER — DOCUSATE SODIUM 100 MG PO CAPS: 100.0000 mg | ORAL_CAPSULE | Freq: Two times a day (BID) | ORAL | 0 refills | Status: DC

## 2016-11-06 MED ORDER — LIDOCAINE HCL (CARDIAC) 20 MG/ML IV SOLN
INTRAVENOUS | Status: AC
  Filled 2016-11-06: qty 5

## 2016-11-06 MED ORDER — SUGAMMADEX SODIUM 200 MG/2ML IV SOLN
INTRAVENOUS | Status: DC | PRN
  Administered 2016-11-06: 130 mg via INTRAVENOUS

## 2016-11-06 MED ORDER — KETOROLAC TROMETHAMINE 30 MG/ML IJ SOLN
30.0000 mg | Freq: Once | INTRAMUSCULAR | Status: DC | PRN
  Administered 2016-11-06: 30 mg via INTRAVENOUS

## 2016-11-06 MED ORDER — CHLORHEXIDINE GLUCONATE 4 % EX LIQD: 60.0000 mL | Freq: Once | CUTANEOUS | Status: DC

## 2016-11-06 MED ORDER — LIDOCAINE-EPINEPHRINE 2 %-1:100000 IJ SOLN
INTRAMUSCULAR | Status: DC | PRN
  Administered 2016-11-06: 15 mL via PERINEURAL

## 2016-11-06 MED ORDER — PROPOFOL 10 MG/ML IV BOLUS
INTRAVENOUS | Status: DC | PRN
  Administered 2016-11-06: 100 mg via INTRAVENOUS

## 2016-11-06 MED ORDER — PROMETHAZINE HCL 25 MG/ML IJ SOLN: 6.2500 mg | INTRAMUSCULAR | Status: DC | PRN

## 2016-11-06 MED ORDER — CEFAZOLIN SODIUM-DEXTROSE 2-4 GM/100ML-% IV SOLN
2.0000 g | INTRAVENOUS | Status: AC
  Administered 2016-11-06: 2 g via INTRAVENOUS
  Filled 2016-11-06: qty 100

## 2016-11-06 MED ORDER — OXYCODONE-ACETAMINOPHEN 5-325 MG PO TABS: 1.0000 | ORAL_TABLET | Freq: Four times a day (QID) | ORAL | 0 refills | Status: DC | PRN

## 2016-11-06 MED ORDER — PNEUMOCOCCAL VAC POLYVALENT 25 MCG/0.5ML IJ INJ
0.5000 mL | INJECTION | INTRAMUSCULAR | Status: AC
  Administered 2016-11-08: 0.5 mL via INTRAMUSCULAR
  Filled 2016-11-06: qty 0.5

## 2016-11-06 MED ORDER — MIDAZOLAM HCL 2 MG/2ML IJ SOLN
INTRAMUSCULAR | Status: AC
  Administered 2016-11-06: 1 mg via INTRAVENOUS
  Filled 2016-11-06: qty 2

## 2016-11-06 SURGICAL SUPPLY — 71 items
APL SKNCLS STERI-STRIP NONHPOA (GAUZE/BANDAGES/DRESSINGS) ×1
BENZOIN TINCTURE PRP APPL 2/3 (GAUZE/BANDAGES/DRESSINGS) ×3 IMPLANT
BIT DRILL 3.2 (BIT) ×3
BIT DRILL 3.2XCALB NS DISP (BIT) ×1 IMPLANT
BIT DRILL CALIBRATED 2.7 (BIT) ×2 IMPLANT
BIT DRILL CALIBRATED 2.7MM (BIT) ×1
BIT DRL 3.2XCALB NS DISP (BIT) ×1
CLOSURE WOUND 1/2 X4 (GAUZE/BANDAGES/DRESSINGS) ×1
COVER SURGICAL LIGHT HANDLE (MISCELLANEOUS) ×3 IMPLANT
DRAPE C-ARM 42X72 X-RAY (DRAPES) ×3 IMPLANT
DRAPE IMP U-DRAPE 54X76 (DRAPES) ×3 IMPLANT
DRAPE INCISE IOBAN 66X45 STRL (DRAPES) ×3 IMPLANT
DRAPE U-SHAPE 47X51 STRL (DRAPES) ×3 IMPLANT
DRSG AQUACEL AG ADV 3.5X 6 (GAUZE/BANDAGES/DRESSINGS) ×3 IMPLANT
DRSG PAD ABDOMINAL 8X10 ST (GAUZE/BANDAGES/DRESSINGS) ×3 IMPLANT
DURAPREP 26ML APPLICATOR (WOUND CARE) ×3 IMPLANT
ELECT REM PT RETURN 9FT ADLT (ELECTROSURGICAL)
ELECTRODE REM PT RTRN 9FT ADLT (ELECTROSURGICAL) IMPLANT
GAUZE SPONGE 4X4 12PLY STRL (GAUZE/BANDAGES/DRESSINGS) ×3 IMPLANT
GAUZE XEROFORM 1X8 LF (GAUZE/BANDAGES/DRESSINGS) ×3 IMPLANT
GLOVE BIO SURGEON STRL SZ7.5 (GLOVE) ×6 IMPLANT
GLOVE BIOGEL PI IND STRL 6.5 (GLOVE) ×2 IMPLANT
GLOVE BIOGEL PI IND STRL 8 (GLOVE) ×2 IMPLANT
GLOVE BIOGEL PI INDICATOR 6.5 (GLOVE) ×4
GLOVE BIOGEL PI INDICATOR 8 (GLOVE) ×4
GLOVE SURG SS PI 6.5 STRL IVOR (GLOVE) ×6 IMPLANT
GOWN STRL REUS W/ TWL LRG LVL3 (GOWN DISPOSABLE) ×3 IMPLANT
GOWN STRL REUS W/ TWL XL LVL3 (GOWN DISPOSABLE) ×1 IMPLANT
GOWN STRL REUS W/TWL LRG LVL3 (GOWN DISPOSABLE) ×9
GOWN STRL REUS W/TWL XL LVL3 (GOWN DISPOSABLE) ×3
K-WIRE 2X5 SS THRDED S3 (WIRE) ×3
KIT BASIN OR (CUSTOM PROCEDURE TRAY) ×3 IMPLANT
KIT ROOM TURNOVER OR (KITS) ×3 IMPLANT
KWIRE 2X5 SS THRDED S3 (WIRE) ×1 IMPLANT
MANIFOLD NEPTUNE II (INSTRUMENTS) ×3 IMPLANT
NEEDLE HYPO 25GX1X1/2 BEV (NEEDLE) IMPLANT
NS IRRIG 1000ML POUR BTL (IV SOLUTION) ×3 IMPLANT
PACK SHOULDER (CUSTOM PROCEDURE TRAY) ×3 IMPLANT
PACK UNIVERSAL I (CUSTOM PROCEDURE TRAY) ×3 IMPLANT
PAD ARMBOARD 7.5X6 YLW CONV (MISCELLANEOUS) ×6 IMPLANT
PEG LOCKING 3.2MMX44 (Peg) ×3 IMPLANT
PEG LOCKING 3.2MMX46 (Peg) ×3 IMPLANT
PEG LOCKING 3.2X32 (Peg) ×6 IMPLANT
PEG LOCKING 3.2X36 (Screw) ×9 IMPLANT
PEG LOCKING 3.2X38 (Screw) ×6 IMPLANT
PLATE 3HOLE HUMERUS PROX RT (Plate) ×3 IMPLANT
SCREW LOCK CORT STAR 3.5X24 (Screw) ×3 IMPLANT
SCREW LP NL T15 3.5X20 (Screw) ×3 IMPLANT
SCREW LP NL T15 3.5X24 (Screw) ×3 IMPLANT
SLEEVE MEASURING 2.7 (BIT) ×3 IMPLANT
SLEEVE MEASURING 3.2 (BIT) ×3 IMPLANT
SLEEVE SURGEON STRL (DRAPES) ×3 IMPLANT
SLING ARM FOAM STRAP LRG (SOFTGOODS) ×3 IMPLANT
SPONGE LAP 4X18 X RAY DECT (DISPOSABLE) IMPLANT
STAPLER VISISTAT 35W (STAPLE) IMPLANT
STRIP CLOSURE SKIN 1/2X4 (GAUZE/BANDAGES/DRESSINGS) ×2 IMPLANT
SUCTION FRAZIER HANDLE 10FR (MISCELLANEOUS) ×2
SUCTION TUBE FRAZIER 10FR DISP (MISCELLANEOUS) ×1 IMPLANT
SUPPORT WRAP ARM LG (MISCELLANEOUS) ×3 IMPLANT
SUT FIBERWIRE #2 38 T-5 BLUE (SUTURE) ×6
SUT MNCRL AB 4-0 PS2 18 (SUTURE) ×3 IMPLANT
SUT MON AB 2-0 CT1 27 (SUTURE) ×3 IMPLANT
SUT VIC AB 0 CTB1 27 (SUTURE) IMPLANT
SUTURE FIBERWR #2 38 T-5 BLUE (SUTURE) ×2 IMPLANT
SYR BULB IRRIGATION 50ML (SYRINGE) ×3 IMPLANT
SYR CONTROL 10ML LL (SYRINGE) IMPLANT
TAPE CLOTH SURG 6X10 WHT LF (GAUZE/BANDAGES/DRESSINGS) ×3 IMPLANT
TOWEL OR 17X24 6PK STRL BLUE (TOWEL DISPOSABLE) ×3 IMPLANT
TOWEL OR 17X26 10 PK STRL BLUE (TOWEL DISPOSABLE) ×3 IMPLANT
TOWEL OR NON WOVEN STRL DISP B (DISPOSABLE) ×3 IMPLANT
WATER STERILE IRR 1000ML POUR (IV SOLUTION) ×3 IMPLANT

## 2016-11-06 NOTE — Anesthesia Procedure Notes (Signed)
Anesthesia Regional Block: Interscalene brachial plexus block   Pre-Anesthetic Checklist: ,, timeout performed, Correct Patient, Correct Site, Correct Laterality, Correct Procedure, Correct Position, site marked, Risks and benefits discussed,  Surgical consent,  Pre-op evaluation,  At surgeon's request and post-op pain management  Laterality: Right  Prep: chloraprep       Needles:  Injection technique: Single-shot  Needle Type: Stimiplex     Needle Length: 9cm      Additional Needles:   Procedures: ultrasound guided,,,,,,,,  Narrative:  Start time: 11/06/2016 2:30 PM End time: 11/06/2016 2:42 PM Injection made incrementally with aspirations every 5 mL.  Performed by: Personally  Anesthesiologist: Nakia Remmers  Additional Notes: Patient tolerated the procedure well without complications

## 2016-11-06 NOTE — Anesthesia Postprocedure Evaluation (Signed)
Anesthesia Post Note  Patient: Vilinda FlakeElizabeth Hinton  Procedure(s) Performed: Procedure(s) (LRB): OPEN REDUCTION INTERNAL FIXATION (ORIF) PROXIMAL HUMERUS FRACTURE (Right)     Patient location during evaluation: PACU Anesthesia Type: General and Regional Level of consciousness: awake and alert Pain management: pain level controlled Vital Signs Assessment: post-procedure vital signs reviewed and stable Respiratory status: spontaneous breathing, nonlabored ventilation, respiratory function stable and patient connected to nasal cannula oxygen Cardiovascular status: blood pressure returned to baseline and stable Postop Assessment: no signs of nausea or vomiting Anesthetic complications: no    Last Vitals:  Vitals:   11/06/16 1645 11/06/16 1700  BP: 122/83 121/75  Pulse: 94 74  Resp: 12 13  Temp: 36.7 C     Last Pain:  Vitals:   11/06/16 1700  TempSrc:   PainSc: 0-No pain                 Suly Vukelich S

## 2016-11-06 NOTE — Op Note (Signed)
11/05/2016 - 11/06/2016  4:09 PM  PATIENT:  Rose Bridges    PRE-OPERATIVE DIAGNOSIS:  UNSPECIFIED FRACTURE OF RIGHT UPPER END OF UNSPECIFIED HUMERUS  POST-OPERATIVE DIAGNOSIS:  Same  PROCEDURE:  OPEN REDUCTION INTERNAL FIXATION (ORIF) PROXIMAL HUMERUS FRACTURE  SURGEON:  Elisa Kutner, Jewel BaizeIMOTHY D, MD  PHYSICIAN ASSISTANT: Aquilla HackerHenry Martensen, PA-C, he was present and scrubbed throughout the case, critical for completion in a timely fashion, and for retraction, instrumentation, and closure.   ANESTHESIA:   General  PREOPERATIVE INDICATIONS:  Rose Bridges is a  56 y.o. female with a diagnosis of UNSPECIFIED FRACTURE OF RIGHT UPPER END OF UNSPECIFIED HUMERUS who elected for surgical management.    The risks benefits and alternatives were discussed with the patient including but not limited to the risks of nonoperative treatment, versus surgical intervention including infection, bleeding, nerve injury, malunion, nonunion, the need for revision surgery, hardware prominence, hardware failure, the need for hardware removal, blood clots, cardiopulmonary complications, conversion to arthroplasty, morbidity, mortality, among others, and they were willing to proceed.  Predicted outcome is good, although there will be at least a six to nine month expected recovery.    OPERATIVE IMPLANTS: Biomet S3 locking plate  OPERATIVE FINDINGS: Displaced proximal humerus fracture  OPERATIVE PROCEDURE: The patient was brought to the operating room and placed in the supine position. General anesthesia was administered. IV antibiotics were given. She was placed in the beach chair position. All bony prominences were padded. The upper extremity was prepped and draped in usual sterile fashion. Deltopectoral incision was performed.  I exposed the fracture site, and placed deep retractors. I did not tenotomize the biceps tendon. This was left in place. I elevated a small portion of the deltoid off of the shaft, in order to  gain access for the plate. I placed supraspinatus and subscapularis stitches, and then reduced the head onto the shaft. This was maintained in satisfactory position.  I applied the plate and secured it into the sliding hole first. I confirmed position of the reduction and the plate with C-arm, and I placed a total of 2 guidewires into the appropriate position in the head. I was satisfied that the plate was distal appropriately, and then secured the plate proximally with smooth pegs, taking care to prevent penetration into the arch articular surface, using C-arm, as well as manual feel using a hand drill.  I then secured the plate distally using another cortical screw. Once complete fixation and reduction of been achieved, took final C-arm pictures, and irrigated the wounds copiously, and repaired the deltopectoral interval with Vicryl followed by Vicryl for the subcutaneous tissue with Monocryl and Steri-Strips for the skin. She was placed in a sling. She had a preoperative regional block as well. She tolerated the procedure well with no complications.   POST OPERATIVE PLAN: Sling full time, DVT px: Ambulation and foot pumps

## 2016-11-06 NOTE — H&P (View-Only) (Signed)
[facility] [address]   [address] [place], [place] [address] [phone] A Division of [facility]                                                                RE: Popper, Taler   [id number]   07/25/1960  10-29-16 Reason for visit: Follow up for continued evaluation of right proximal humerus fracture-CT scan review.   History of present illness: This is an acute traumatic problem that began on October 20, 2016 when rolling out of bed and falling onto her right arm.   Today her pain is under better control, but feels significant pain with any movement.  She denies numbness or tingling.  She thought about non-surgical versus surgical options since previous visit and would prefer surgery if possible to maximize her ability to regain function in this arm.   She has a history of osteogenesis imperfecta and is a one pack per day smoker.    EXAMINATION: Well appearing and in no acute distress.  Right arm in sling.  Resolving significant ecchymosis of the right upper extremity and posterior shoulder.  Hand is warm with good capillary refill.  Sensation is intact distally with good grip strength.    X-RAYS: CT scan of the right humerus shows a severely comminuted fracture of the surgical neck right proximal humerus with 2 centimeters of lateral displacement.  Fracture involves the greater tuberosity with less than 10 mm of displacement.  Fracture involves the lesser tuberosity.  No articular surface involvement.    ASSESSMENT/PLAN: Discussed non-operative versus surgical intervention today, including the risks and benefits and those added by her smoking and poor bone quality.  She verbalizes understanding and would like to proceed with surgery, which would likely be ORIF of the right proximal humerus.  Dr. Murphy will review the CT scans additionally and will contact the patient with any updates to surgical planning, including the  possibility of shoulder arthroplasty.  She will plan to follow up with Dr. Murphy postoperatively.    Timothy D.  Murphy, M.D.  Dictated by: H.C. Martensen, PA-C Electronically verified by Timothy D. Murphy, M.D. TDM(HCM):jjh D 10-30-16 T 11-01-16  

## 2016-11-06 NOTE — Anesthesia Procedure Notes (Signed)
Procedure Name: Intubation Date/Time: 11/06/2016 2:54 PM Performed by: Rosiland OzMEYERS, Kaly Mcquary Pre-anesthesia Checklist: Patient identified, Emergency Drugs available, Suction available, Patient being monitored and Timeout performed Patient Re-evaluated:Patient Re-evaluated prior to inductionOxygen Delivery Method: Circle system utilized Preoxygenation: Pre-oxygenation with 100% oxygen Intubation Type: IV induction Ventilation: Mask ventilation without difficulty and Oral airway inserted - appropriate to patient size Laryngoscope Size: Miller and 3 Grade View: Grade I Tube type: Oral Tube size: 7.0 mm Number of attempts: 1 Airway Equipment and Method: Stylet Placement Confirmation: ETT inserted through vocal cords under direct vision,  positive ETCO2 and breath sounds checked- equal and bilateral Secured at: 21 cm Tube secured with: Tape Dental Injury: Teeth and Oropharynx as per pre-operative assessment

## 2016-11-06 NOTE — Interval H&P Note (Signed)
History and Physical Interval Note:  11/06/2016 2:34 PM  Rose Bridges  has presented today for surgery, with the diagnosis of UNSPECIFIED FRACTURE OF RIGHT UPPER END OF UNSPECIFIED HUMERUS  The various methods of treatment have been discussed with the patient and family. After consideration of risks, benefits and other options for treatment, the patient has consented to  Procedure(s): OPEN REDUCTION INTERNAL FIXATION (ORIF) PROXIMAL HUMERUS FRACTURE (Right) as a surgical intervention .  The patient's history has been reviewed, patient examined, no change in status, stable for surgery.  I have reviewed the patient's chart and labs.  Questions were answered to the patient's satisfaction.     Evian Salguero D

## 2016-11-06 NOTE — ED Notes (Addendum)
Patient made aware of delay in rooming due to need for low bed

## 2016-11-06 NOTE — Progress Notes (Signed)
Progress Note    Rose Bridges  RUE:[id number]:[id number] DOB: [birth year]  DOA: 11/05/2016 PCP: Patient, No Pcp Per    Brief Narrative:   Chief complaint: Follow-up syncope  Medical records reviewed and are as summarized below:  Rose Bridges is an 56 y.o. female with a PMH of alcohol abuse with multiple falls in the past, multiple fractures in the past, osteogenesis imperfecta, and hypertension with recent humeral fracture scheduled for an elective repair 11/06/16, who presented to the hospital on 11/05/16 after suffering from a syncopal episode versus a fall. Essentially, woke up on the floor with a lamp across her from a presumed fall from her bed. Screening d-dimer was 2.6 and the patient was mildly tachycardic so she was admitted to rule out pulmonary embolism and for observation prior to her surgical intervention.  Assessment/Plan:   Principal Problem:   Syncope/Elevated d-dimer/tachycardia concerning for pulmonary embolism VQ scan ordered to rule out pulmonary embolism. 2-D echo also ordered. Will cancel VQ, get 20 gauge IV and do CT of the chest to R/O PE. If CT negative, proceed with surgery.  Active Problems:   Fall from slip, trip, or stumble Had multiple films done including femur, ribs, left shoulder, left humerus, pelvis, CT of the head, and CT of the cervical spine which were negative for fractures. Has had multiple episodes of waking up on floor.  Denies any change in alcohol intake, no new meds other than Tramadol, but these episodes began before she started the Tramadol.    History of alcohol abuse Alcohol level less than 5 on admission. Admits to drinking Vodka daily, but minimizes amount.  Watch for withdrawal.   HIV screening The patient falls between the ages of 13-64 and should be screened for HIV, therefore HIV testing ordered: Nonreactive.   Family Communication/Anticipated D/C date and plan/Code Status   DVT prophylaxis: Heparin ordered. Code Status: Full  Code.  Family Communication: No family at the bedside. Disposition Plan: Lives with elderly mother.   Medical Consultants:    Orthopedic Surgery   Procedures:    None  Anti-Infectives:    None  Subjective:   Patient reports right shoulder pain. Denies shortness of breath and chest pain. Minimizes alcohol use.  Objective:    Vitals:   11/05/16 2315 11/06/16 0015 11/06/16 0250 11/06/16 0459  BP: 134/75 (!) 141/84 128/77   Pulse: 98 94 91 96  Resp: 19 14 16 18   Temp:   98.6 F (37 C) 98.8 F (37.1 C)  TempSrc:   Oral Oral  SpO2: 96% 93% 95% 92%  Weight:      Height:       No intake or output data in the 24 hours ending 11/06/16 0922 Filed Weights   11/05/16 1235  Weight: 63 kg (139 lb)    Exam: General exam: Unkempt. Appears calm and comfortable.  Respiratory system: Clear to auscultation. Respiratory effort normal. Cardiovascular system: S1 & S2 heard, RRR. No JVD,  rubs, gallops or clicks. No murmurs. Gastrointestinal system: Abdomen is nondistended, soft and nontender. No organomegaly or masses felt. Normal bowel sounds heard. Central nervous system: Alert and oriented. No focal neurological deficits. Extremities: No clubbing,  or cyanosis. No edema. Skin: No rashes, lesions or ulcers. Psychiatry: Judgement and insight appear impaired. Mood & affect appropriate.   Data Reviewed:   I have personally reviewed following labs and imaging studies:  Labs: Chloride 96, glucose 105, otherwise chemistries normal. AST 56, ALT 25, alkaline phosphatase 141, total bilirubin  1.8, protein 6.4, albumin 3.3. WBC and hemoglobin are normal. D-dimer 2.62. MRSA screen negative.  Radiology:  Dg Ribs Unilateral W/chest Left 11/05/16: Negative for rib fracture/pneumothorax. Dg Pelvis 1-2 Views 11/05/16: Status post left hemiarthroplasty. No evidence of hardware failure. Ct Head Wo Contrast 11/05/16: Negative acute. Remote lacunar infarcts of bilateral basal ganglia. Ct Cervical  Spine Wo Contrast 11/05/16: No evidence for acute cervical spine abnormality. Dg Shoulder Left 11/05/16: Negative. Dg Humerus Left 11/05/16: Negative. Dg Femur Min 2 Views Left 11/05/16: Status post left hip hemiarthroplasty with no radiographic evidence for hardware failure.   Medications:   . acetaminophen  1,000 mg Oral Once  . amLODipine  10 mg Oral Daily  . atorvastatin  40 mg Oral Daily  . chlorhexidine  60 mL Topical Once  . folic acid  1 mg Oral Daily  . gabapentin  300 mg Oral Once  . heparin  5,000 Units Subcutaneous Q8H  . multivitamin with minerals  1 tablet Oral Daily  . [START ON 11/07/2016] pneumococcal 23 valent vaccine  0.5 mL Intramuscular Tomorrow-1000  . sodium chloride flush  3 mL Intravenous Q12H  . thiamine  100 mg Oral Daily   Or  . thiamine  100 mg Intravenous Daily   Continuous Infusions: .  ceFAZolin (ANCEF) IV    . lactated ringers      Medical decision making is of high complexity and this patient is at high risk of deterioration, therefore this is a level 3 visit.  (> 4 problem points, >4 data points, high risk: Need 2 out of 3)   Problems/DDx Points   Self limited or minor (max 2)       1  2  Established problem, stable       1   Established problem, worsening alcoholism w/ falls and injury       2  2  New problem, no additional W/U planned (max 1)       3   New problem, additional W/U planned        4    Data Reviewed Points   Review/order clinical lab tests       1  1  Review/order x-rays       1  1  Review/order tests (Echo, EKG, PFTs, etc)       1   Discussion of test results w/ performing MD       1   Independent review of image, tracing or specimen       2   Decision to obtain old records       1   Review and summation of old records       2   2   Level of risk Presenting prob Diagnostics Management   Minimal 1 self limited/minor Labs CXR EKG/EEG U/A U/S Rest Gargles Bandages Dressings   Low 2 or more self limited/minor 1 stable  chronic Acute uncomplicated illness Tests (PFTS) Non-CV imaging Arterial labs Biopsies of skin OTC drugs Minor surgery-no risk PT OT IVF without additives    Moderate 1 or more chronic illnesses w/ mild exac, progression or S/E from tx 2 or more stable chronic illnesses Undiagnosed new problem w/ uncertain prognosis Acute complicated injury  Stress tests Endoscopies with no risk factors Deep needle or incisional bx CV imaging without risk LP Thoracentesis Paracentesis Minor surgery w/ risks Elective major surgery w/ no risk (open, percutaneous or endoscopic) Prescription drugs Therapeutic nucl med IVF with additives Closed tx of fracture/dislocation  High Severe exac of chronic illness Acute or chronic illness/injury may pose a threat to life or bodily function (ARF) Change in neuro status    CV imaging w/ contrast and risk Cardio electophysiologic tests Endoscopies w/ risk Discography Elective major surgery Emergency major surgery Parenteral controlled substances Drug therapy req monitoring for toxicity DNR/de-escalation of care    MDM Prob points Data points Risk   Straightforward    <1    <1    Min   Low complexity    2    2    Low   Moderate    3    3    Mod   High Complexity    4 or more    4 or more    High      LOS: 0 days   Bedford Winsor  [facility] Pager [phone]. If unable to reach me by pager, please call my cell phone at [phone].  *Please refer to [email or web address], password [email or web address] to get updated schedule on who will round on this patient, as hospitalists switch teams weekly. If 7PM-7AM, please contact night-coverage at [email or web address], password [email or web address] for any overnight needs.  11/06/2016, 9:22 AM

## 2016-11-06 NOTE — Anesthesia Procedure Notes (Signed)
Anesthesia Procedure Image    

## 2016-11-06 NOTE — Transfer of Care (Signed)
Immediate Anesthesia Transfer of Care Note  Patient: Rose FlakeElizabeth Pavlov  Procedure(s) Performed: Procedure(s): OPEN REDUCTION INTERNAL FIXATION (ORIF) PROXIMAL HUMERUS FRACTURE (Right)  Patient Location: PACU  Anesthesia Type:General and GA combined with regional for post-op pain  Level of Consciousness: awake, alert  and patient cooperative  Airway & Oxygen Therapy: Patient Spontanous Breathing  Post-op Assessment: Report given to RN and Post -op Vital signs reviewed and stable  Post vital signs: Reviewed and stable  Last Vitals:  Vitals:   11/06/16 1436 11/06/16 1437  BP:    Pulse: 90 87  Resp: 14 16  Temp:      Last Pain:  Vitals:   11/06/16 1100  TempSrc:   PainSc: 5          Complications: No apparent anesthesia complications

## 2016-11-06 NOTE — Anesthesia Preprocedure Evaluation (Signed)
Anesthesia Evaluation  Patient identified by MRN, date of birth, ID band Patient awake    Reviewed: Allergy & Precautions, NPO status , Patient's Chart, lab work & pertinent test results  Airway Mallampati: II  TM Distance: >3 FB Neck ROM: Full    Dental no notable dental hx.    Pulmonary Current Smoker,    Pulmonary exam normal breath sounds clear to auscultation       Cardiovascular hypertension, Normal cardiovascular exam Rhythm:Regular Rate:Normal     Neuro/Psych negative neurological ROS  negative psych ROS   GI/Hepatic negative GI ROS, Neg liver ROS,   Endo/Other  negative endocrine ROS  Renal/GU negative Renal ROS  negative genitourinary   Musculoskeletal negative musculoskeletal ROS (+)   Abdominal   Peds negative pediatric ROS (+)  Hematology negative hematology ROS (+)   Anesthesia Other Findings   Reproductive/Obstetrics negative OB ROS                             Anesthesia Physical Anesthesia Plan  ASA: II  Anesthesia Plan: General   Post-op Pain Management:  Regional for Post-op pain   Induction: Intravenous  PONV Risk Score and Plan: 1 and Ondansetron and Dexamethasone  Airway Management Planned: Oral ETT  Additional Equipment:   Intra-op Plan:   Post-operative Plan: Extubation in OR  Informed Consent: I have reviewed the patients History and Physical, chart, labs and discussed the procedure including the risks, benefits and alternatives for the proposed anesthesia with the patient or authorized representative who has indicated his/her understanding and acceptance.   Dental advisory given  Plan Discussed with: CRNA and Surgeon  Anesthesia Plan Comments:         Anesthesia Quick Evaluation

## 2016-11-07 ENCOUNTER — Encounter (HOSPITAL_COMMUNITY): Payer: Self-pay | Admitting: Orthopedic Surgery

## 2016-11-07 ENCOUNTER — Inpatient Hospital Stay (HOSPITAL_COMMUNITY): Payer: BLUE CROSS/BLUE SHIELD

## 2016-11-07 DIAGNOSIS — R55 Syncope and collapse: Secondary | ICD-10-CM

## 2016-11-07 DIAGNOSIS — S42291D Other displaced fracture of upper end of right humerus, subsequent encounter for fracture with routine healing: Secondary | ICD-10-CM

## 2016-11-07 DIAGNOSIS — S42201A Unspecified fracture of upper end of right humerus, initial encounter for closed fracture: Secondary | ICD-10-CM | POA: Diagnosis present

## 2016-11-07 DIAGNOSIS — W010XXD Fall on same level from slipping, tripping and stumbling without subsequent striking against object, subsequent encounter: Secondary | ICD-10-CM

## 2016-11-07 LAB — GLUCOSE, CAPILLARY: Glucose-Capillary: 171 mg/dL — ABNORMAL HIGH (ref 65–99)

## 2016-11-07 NOTE — Procedures (Signed)
ELECTROENCEPHALOGRAM REPORT  Date of Study: 11/07/2016  Patient's Name: Rose Bridges MRN: [id number] Date of Birth: 1961/03/26  Referring Provider: Dr. Mauro KaufmannGagan Lama  Clinical History: This is a 56 year old woman with multiple falls where she would wake up on the floor.   Medications: acetaminophen (TYLENOL) tablet 650 mg  amLODipine (NORVASC) tablet 10 mg  atorvastatin (LIPITOR) tablet 40 mg  ceFAZolin (ANCEF) IVPB 1 g/50 mL premix  folic acid (FOLVITE) tablet 1 mg  heparin injection 5,000 Units  HYDROmorphone (DILAUDID) injection 0.2-0.5 mg  ketorolac (TORADOL) 15 MG/ML injection 7.5 mg  multivitamin with minerals tablet 1 tablet  ondansetron (ZOFRAN) injection 4 mg  oxyCODONE (Oxy IR/ROXICODONE) immediate release tablet 5-10 mg  thiamine (VITAMIN B-1) tablet 100 mg  traMADol (ULTRAM) tablet 50 mg   Technical Summary: A multichannel digital EEG recording measured by the international 10-20 system with electrodes applied with paste and impedances below 5000 ohms performed as portable with EKG monitoring in an awake and asleep patient.  Hyperventilation was not performed. Photic stimulation was performed.  The digital EEG was referentially recorded, reformatted, and digitally filtered in a variety of bipolar and referential montages for optimal display.   Description: The patient is awake and asleep during the recording.  During maximal wakefulness, there is a symmetric, medium voltage 7 Hz posterior dominant rhythm that attenuates with eye opening. This is admixed with a small amount of diffuse 4-6 Hz theta and occasional 2-3 Hz delta slowing of the waking background.  During drowsiness and sleep, there is an increase in theta and delta slowing of the background. There is rare focal 2-3 Hz slowing seen over the left temporal region. Occasional vertex waves were seen.  Photic stimulation did not elicit any abnormalities.  There were no epileptiform discharges or electrographic  seizures seen.    EKG lead showed sinus tachycardia.  Impression: This awake and asleep EEG is abnormal due to the presence of: 1. Mild diffuse slowing of the waking background 2. Rare focal slowing over the left temporal region  Clinical Correlation of the above findings indicates diffuse cerebral dysfunction that is non-specific in etiology and can be seen with hypoxic/ischemic injury, toxic/metabolic encephalopathies, neurodegenerative disorders, or medication effect. Additional focal slowing over the left temporal region indicates focal cerebral dysfunction in this region suggestive of underlying structural or physiologic abnormality. The absence of epileptiform discharges does not rule out a clinical diagnosis of epilepsy.  Clinical correlation is advised.   Patrcia DollyKaren Yasser Hepp, M.D.

## 2016-11-07 NOTE — Progress Notes (Signed)
   Assessment: 1 Day Post-Op  S/P Procedure(s) (LRB): OPEN REDUCTION INTERNAL FIXATION (ORIF) PROXIMAL HUMERUS FRACTURE (Right) by Dr. Jewel Baizeimothy D. Murphy on 11/06/16  Principal Problem:   Syncope Active Problems:   Fall from slip, trip, or stumble   Closed fracture of right proximal humerus  Right Proximal Humerus Fracture: Pain controlled.  Her arm is feeling "much better than before the surgery." Sling for comfort.   No ROM restriction elbow / hand Incentive Spirometry Apply ice  Weight Bearing: Non Weight Bearing (NWB) Right Arm Dressings: Maintain.  VTE prophylaxis: SCDs, ambulation Dispo: Home when cleared medically.  Follow up in the office with Dr. Wandra Feinstein. Murphy as scheduled.  Please call with questions.  Subjective: Patient reports pain as mild. Pain controlled with PO meds.  Tolerating diet.  Urinating. No CP, SOB.    Objective:   VITALS:   Vitals:   11/06/16 1815 11/06/16 2148 11/07/16 0029 11/07/16 0430  BP: 109/63 125/66 122/75 130/70  Pulse: 79 78 79 69  Resp:  19 17 17   Temp: (!) 97.5 F (36.4 C) 98.3 F (36.8 C) 98.5 F (36.9 C) 98.9 F (37.2 C)  TempSrc: Oral Oral Oral Oral  SpO2: 100% 95% 96% 96%  Weight:      Height:       CBC Latest Ref Rng & Units 11/05/2016 10/03/2015 09/29/2015  WBC 4.0 - 10.5 K/uL 10.1 5.3 4.9  Hemoglobin 12.0 - 15.0 g/dL 16.114.5 09.613.0 11.9(L)  Hematocrit 36.0 - 46.0 % 43.7 42 38.1  Platelets 150 - 400 K/uL 265 350 164   BMP Latest Ref Rng & Units 11/05/2016 10/03/2015 09/27/2015  Glucose 65 - 99 mg/dL 045(W105(H) - 098(J103(H)  BUN 6 - 20 mg/dL 10 12 6   Creatinine 0.44 - 1.00 mg/dL 1.910.64 0.7 4.780.77  Sodium 295135 - 145 mmol/L 139 140 135  Potassium 3.5 - 5.1 mmol/L 3.6 4.7 4.1  Chloride 101 - 111 mmol/L 96(L) - 102  CO2 22 - 32 mmol/L 30 - 25  Calcium 8.9 - 10.3 mg/dL 8.9 - 7.9(L)   Intake/Output      07/10 0701 - 07/11 0700 07/11 0701 - 07/12 0700   P.O. 80    I.V. (mL/kg) 2175 (34.5)    IV Piggyback 100    Total Intake(mL/kg) 2355 (37.4)    Urine (mL/kg/hr) 120 (0.1)    Blood 100 (0.1)    Total Output 220     Net +2135          Urine Occurrence 3 x      Physical Exam: General: NAD.  Supine in bed. Calm, conversant.  No increased work of breathing. MSK Right upper extremity: Neurovascularly intact Sensation intact distally Intact pulses distally Incision: dressing C/D/I   Rose BilletHenry Calvin Martensen III, PA-C 11/07/2016, 7:44 AM

## 2016-11-07 NOTE — Progress Notes (Signed)
[facility]  PROGRESS NOTE  Rose Bridges ZOX:[id number] DOB: [birth year] DOA: 11/05/2016 PCP: Patient, No Pcp Per   Brief HPI:   56 y.o. female with a PMH of alcohol abuse with multiple falls in the past, multiple fractures in the past, osteogenesis imperfecta, and hypertension with recent humeral fracture scheduled for an elective repair 11/06/16, who presented to the hospital on 11/05/16 after suffering from a syncopal episode versus a fall. Essentially, woke up on the floor with a lamp across her from a presumed fall from her bed. Screening d-dimer was 2.6 and the patient was mildly tachycardic so she was admitted to rule out pulmonary embolism and for observation prior to her surgical intervention.     Subjective   This morning patient denies any pain. No chest pain or shortness of breath.   Assessment/Plan:     1. Syncope- no clear etiology at this time. D-dimer was elevated CTA chest was done which was negative for PE. As patient has had this multiple times, and also tells me that every time her diaper was wet. Will order EEG to rule out underlying seizure disorder. Patient was taking Depakote for many years which has been discontinued by her PCP. She has not been Depakote for past few years. 2. Status post ORIF proximal humerus fracture- patient underwent ORIF of the proximal right humerus with persistent inferior subluxation of right humeral head. 3. Status post fall- multiple films done including female ribs, left shoulder left humerus, pelvis CT of the head and CT of cervical spine which were all negative for fractures. Patient has multiple episodes of waking up on the floor. No recent increase in alcohol intake. She has not taken new medications other than tramadol but these episodes began before she started taking tramadol. 4. History of alcohol abuse- alcohol level was less than 5 on admission. She admits to drinking vodka daily. No recent increase in alcohol intake as per  patient. No signs and symptoms of alcohol withdrawal. 5. HIV screening- nonreactive    DVT prophylaxis: Heparin  Code Status: Full code  Family Communication: No family member at bedside   Disposition Plan: Likely home in 1-2 days   Consultants:    Orthopedics  Procedures:  None  Continuous infusions . lactated ringers 100 mL/hr at 11/06/16 1827      Antibiotics:   Anti-infectives    Start     Dose/Rate Route Frequency Ordered Stop   11/06/16 2000  ceFAZolin (ANCEF) IVPB 1 g/50 mL premix     1 g 100 mL/hr over 30 Minutes Intravenous Every 6 hours 11/06/16 1800 11/07/16 1419   11/06/16 0900  ceFAZolin (ANCEF) IVPB 2g/100 mL premix     2 g 200 mL/hr over 30 Minutes Intravenous On call to O.R. 11/06/16 0206 11/06/16 1500       Objective   Vitals:   11/06/16 2148 11/07/16 0029 11/07/16 0430 11/07/16 1300  BP: 125/66 122/75 130/70 125/67  Pulse: 78 79 69 99  Resp: 19 17 17 18   Temp: 98.3 F (36.8 C) 98.5 F (36.9 C) 98.9 F (37.2 C) 98.5 F (36.9 C)  TempSrc: Oral Oral Oral Oral  SpO2: 95% 96% 96% 98%  Weight:      Height:        Intake/Output Summary (Last 24 hours) at 11/07/16 1508 Last data filed at 11/07/16 1300  Gross per 24 hour  Intake             2595 ml  Output  420 ml  Net             2175 ml   Filed Weights   11/05/16 1235  Weight: 63 kg (139 lb)     Physical Examination:  Physical Exam: Eyes: No icterus, extraocular muscles intact  Mouth: Oral mucosa is moist, no lesions on palate,  Neck: Supple, no deformities, masses, or tenderness Lungs: Normal respiratory effort, bilateral clear to auscultation, no crackles or wheezes.  Heart: Regular rate and rhythm, S1 and S2 normal, no murmurs, rubs auscultated Abdomen: BS normoactive,soft,nondistended,non-tender to palpation,no organomegaly Extremities: Right upper extremity in sling. Neuro : Alert and oriented to time, place and person, No focal deficits Skin: No rashes  seen on exam    Data Reviewed: I have personally reviewed following labs and imaging studies  CBG:  Recent Labs Lab 11/06/16 0659 11/07/16 0558  GLUCAP 100* 171*    CBC:  Recent Labs Lab 11/05/16 1715  WBC 10.1  NEUTROABS 7.8*  HGB 14.5  HCT 43.7  MCV 112.6*  PLT 265    Basic Metabolic Panel:  Recent Labs Lab 11/05/16 1715  NA 139  K 3.6  CL 96*  CO2 30  GLUCOSE 105*  BUN 10  CREATININE 0.64  CALCIUM 8.9    Recent Results (from the past 240 hour(s))  Surgical pcr screen     Status: None   Collection Time: 11/06/16  2:39 AM  Result Value Ref Range Status   MRSA, PCR NEGATIVE NEGATIVE Final   Staphylococcus aureus NEGATIVE NEGATIVE Final    Comment:        The Xpert SA Assay (FDA approved for NASAL specimens in patients over 51 years of age), is one component of a comprehensive surveillance program.  Test performance has been validated by [facility] for patients greater than or equal to 39 year old. It is not intended to diagnose infection nor to guide or monitor treatment.      Liver Function Tests:  Recent Labs Lab 11/05/16 1715  AST 56*  ALT 25  ALKPHOS 141*  BILITOT 1.8*  PROT 6.4*  ALBUMIN 3.3*   No results for input(s): LIPASE, AMYLASE in the last 168 hours. No results for input(s): AMMONIA in the last 168 hours.  Cardiac Enzymes: No results for input(s): CKTOTAL, CKMB, CKMBINDEX, TROPONINI in the last 168 hours. BNP (last 3 results) No results for input(s): BNP in the last 8760 hours.  ProBNP (last 3 results) No results for input(s): PROBNP in the last 8760 hours.    Studies: Dg Ribs Unilateral W/chest Left  Result Date: 11/05/2016 CLINICAL DATA:  Patient fell.  Pain. EXAM: LEFT RIBS AND CHEST - 3+ VIEW COMPARISON:  Chest radiograph 10/21/2016. FINDINGS: No fracture or other bone lesions are seen involving the ribs. There is no evidence of pneumothorax or pleural effusion. Both lungs are clear. Heart size and mediastinal  contours are within normal limits. Incompletely visualized RIGHT shoulder fracture occurred in June. Soft tissue calcification in both breasts. IMPRESSION: Negative for rib fracture or pneumothorax. Electronically Signed   By: Elsie Stain M.D.   On: 11/05/2016 15:57   Dg Pelvis 1-2 Views  Result Date: 11/05/2016 CLINICAL DATA:  Fall from bed, on floor for 5 hours. EXAM: LEFT FEMUR 2 VIEWS; PELVIS - 1-2 VIEW COMPARISON:  LEFT pelvic radiograph Sep 26, 2015 FINDINGS: Femoral heads are well formed and located. Status post LEFT hip hemiarthroplasty with intact well-seated non cemented hardware. No periprosthetic lucency. Hip joint spaces are intact. Sacroiliac joints  are symmetric. No destructive bony lesions. Included soft tissue planes are non-suspicious. Phleboliths project in the pelvis. IMPRESSION: No acute fracture deformity or dislocation. Status post LEFT hip hemiarthroplasty, no radiographic findings of hardware failure. Electronically Signed   By: Awilda Metro M.D.   On: 11/05/2016 15:56   Dg Shoulder Right  Result Date: 11/06/2016 CLINICAL DATA:  Elective surgery.  Proximal right humerus fracture EXAM: DG C-ARM 61-120 MIN; RIGHT SHOULDER - 2+ VIEW COMPARISON:  None. FINDINGS: Intraoperative fluoroscopy shows a reduced proximal humerus fracture with lateral plate fixation. Inferior glenohumeral subluxation which may intentional. IMPRESSION: 1. Fluoroscopy for proximal humerus fracture ORIF. 2. Inferior glenohumeral subluxation. Electronically Signed   By: Marnee Spring M.D.   On: 11/06/2016 16:35   Ct Head Wo Contrast  Result Date: 11/05/2016 CLINICAL DATA:  Pt states she believes she rolled out of bed and was on the floor for 5 hours. This is the second time she has fallen out of bed in 2 weeks EXAM: CT HEAD WITHOUT CONTRAST CT CERVICAL SPINE WITHOUT CONTRAST TECHNIQUE: Multidetector CT imaging of the head and cervical spine was performed following the standard protocol without intravenous  contrast. Multiplanar CT image reconstructions of the cervical spine were also generated. COMPARISON:  None. FINDINGS: CT HEAD FINDINGS Brain: There small remote lacunar infarcts in the basal ganglia bilaterally. There is no intra or extra-axial fluid collection or mass lesion. The basilar cisterns and ventricles have a normal appearance. There is no CT evidence for acute infarction or hemorrhage. Vascular: No hyperdense vessel or unexpected calcification. Skull: Normal. Negative for fracture or focal lesion. Sinuses/Orbits: There is mucoperiosteal thickening of the paranasal sinuses. Orbits are intact. Mastoid air cells are normally aerated. Other: Possible odontogenic cysts versus mucous retention cysts in the maxillary sinuses bilaterally. These are only partially imaged. CT CERVICAL SPINE FINDINGS Alignment: Normal. Skull base and vertebrae: No acute fracture. No primary bone lesion or focal pathologic process. Soft tissues and spinal canal: No prevertebral fluid or swelling. No visible canal hematoma. Disc levels:  Disc height loss primarily at C6-7. Upper chest: Unremarkable. Other: None IMPRESSION: 1.  No evidence for acute intracranial abnormality. 2. Remote lacunar infarcts of bilateral basal ganglia. 3.  No evidence for acute cervical spine abnormality. 4. Partially imaged odontogenic versus mucous retention cysts within the maxillary sinuses bilaterally. Electronically Signed   By: Norva Pavlov M.D.   On: 11/05/2016 16:40   Ct Angio Chest Pe W Or Wo Contrast  Result Date: 11/06/2016 CLINICAL DATA:  Tachycardia, syncope. EXAM: CT ANGIOGRAPHY CHEST WITH CONTRAST TECHNIQUE: Multidetector CT imaging of the chest was performed using the standard protocol during bolus administration of intravenous contrast. Multiplanar CT image reconstructions and MIPs were obtained to evaluate the vascular anatomy. CONTRAST:  80 mL of Isovue 370 intravenously. COMPARISON:  Radiographs of November 05, 2016. FINDINGS:  Cardiovascular: Satisfactory opacification of the pulmonary arteries to the segmental level. No evidence of pulmonary embolism. Normal heart size. No pericardial effusion. Mediastinum/Nodes: No enlarged mediastinal, hilar, or axillary lymph nodes. Thyroid gland, trachea, and esophagus demonstrate no significant findings. Lungs/Pleura: No pneumothorax or pleural effusion is noted. Mild bilateral posterior basilar subsegmental atelectasis is noted. Upper Abdomen: No acute abnormality. Musculoskeletal: No chest wall abnormality. No acute or significant osseous findings. Review of the MIP images confirms the above findings. IMPRESSION: No definite evidence of pulmonary embolus. Mild bilateral posterior basilar subsegmental atelectasis. Electronically Signed   By: Lupita Raider, M.D.   On: 11/06/2016 12:44   Ct Cervical Spine  Wo Contrast  Result Date: 11/05/2016 CLINICAL DATA:  Pt states she believes she rolled out of bed and was on the floor for 5 hours. This is the second time she has fallen out of bed in 2 weeks EXAM: CT HEAD WITHOUT CONTRAST CT CERVICAL SPINE WITHOUT CONTRAST TECHNIQUE: Multidetector CT imaging of the head and cervical spine was performed following the standard protocol without intravenous contrast. Multiplanar CT image reconstructions of the cervical spine were also generated. COMPARISON:  None. FINDINGS: CT HEAD FINDINGS Brain: There small remote lacunar infarcts in the basal ganglia bilaterally. There is no intra or extra-axial fluid collection or mass lesion. The basilar cisterns and ventricles have a normal appearance. There is no CT evidence for acute infarction or hemorrhage. Vascular: No hyperdense vessel or unexpected calcification. Skull: Normal. Negative for fracture or focal lesion. Sinuses/Orbits: There is mucoperiosteal thickening of the paranasal sinuses. Orbits are intact. Mastoid air cells are normally aerated. Other: Possible odontogenic cysts versus mucous retention cysts in  the maxillary sinuses bilaterally. These are only partially imaged. CT CERVICAL SPINE FINDINGS Alignment: Normal. Skull base and vertebrae: No acute fracture. No primary bone lesion or focal pathologic process. Soft tissues and spinal canal: No prevertebral fluid or swelling. No visible canal hematoma. Disc levels:  Disc height loss primarily at C6-7. Upper chest: Unremarkable. Other: None IMPRESSION: 1.  No evidence for acute intracranial abnormality. 2. Remote lacunar infarcts of bilateral basal ganglia. 3.  No evidence for acute cervical spine abnormality. 4. Partially imaged odontogenic versus mucous retention cysts within the maxillary sinuses bilaterally. Electronically Signed   By: Norva Pavlov M.D.   On: 11/05/2016 16:40   Dg Shoulder Left  Result Date: 11/05/2016 CLINICAL DATA:  Fall.  Pain. EXAM: LEFT SHOULDER - 2+ VIEW COMPARISON:  None. FINDINGS: There is no evidence of fracture or dislocation. There is no evidence of arthropathy or other focal bone abnormality. Soft tissues are unremarkable. IMPRESSION: Negative. Electronically Signed   By: Kennith Center M.D.   On: 11/05/2016 15:57   Dg Humerus Left  Result Date: 11/05/2016 CLINICAL DATA:  Fall. EXAM: LEFT HUMERUS - 2+ VIEW COMPARISON:  None. FINDINGS: Bones are diffusely demineralized. No evidence for humerus fracture. No worrisome lytic or sclerotic osseous abnormality. IMPRESSION: Negative. Electronically Signed   By: Kennith Center M.D.   On: 11/05/2016 15:56   Dg Humerus Right  Result Date: 11/06/2016 CLINICAL DATA:  Post ORIF of a closed fracture of the proximal RIGHT humerus EXAM: RIGHT HUMERUS - 2+ VIEW COMPARISON:  Portable exam at 1722 hours compared intraoperative images of 11/06/2016 FINDINGS: Plate and multiple screws/pegs at proximal RIGHT humerus post ORIF of the previously identified surgical neck fracture. Inferior subluxation of the RIGHT humeral head again seen. Question subtle nondisplaced fracture plane at the proximal  humeral diaphysis at the level of the next to most distal screw. This is seen only on a single view. Diffuse osseous demineralization. IMPRESSION: Post ORIF of the proximal RIGHT humerus with persistent inferior subluxation of the RIGHT humeral head. Questionable nondisplaced fracture plane of the proximal RIGHT humeral diaphysis adjacent to the next to most distal screw, seen only on single view. Findings called to [place] RN on [facility] on 11/06/2016 at 1940 hours. Electronically Signed   By: Ulyses Southward M.D.   On: 11/06/2016 19:42   Dg C-arm 1-60 Min  Result Date: 11/06/2016 CLINICAL DATA:  Elective surgery.  Proximal right humerus fracture EXAM: DG C-ARM 61-120 MIN; RIGHT SHOULDER - 2+ VIEW COMPARISON:  None.  FINDINGS: Intraoperative fluoroscopy shows a reduced proximal humerus fracture with lateral plate fixation. Inferior glenohumeral subluxation which may intentional. IMPRESSION: 1. Fluoroscopy for proximal humerus fracture ORIF. 2. Inferior glenohumeral subluxation. Electronically Signed   By: Marnee Spring M.D.   On: 11/06/2016 16:35   Dg Femur Min 2 Views Left  Result Date: 11/05/2016 CLINICAL DATA:  Fall from bed, on floor for 5 hours. EXAM: LEFT FEMUR 2 VIEWS; PELVIS - 1-2 VIEW COMPARISON:  LEFT pelvic radiograph Sep 26, 2015 FINDINGS: Femoral heads are well formed and located. Status post LEFT hip hemiarthroplasty with intact well-seated non cemented hardware. No periprosthetic lucency. Hip joint spaces are intact. Sacroiliac joints are symmetric. No destructive bony lesions. Included soft tissue planes are non-suspicious. Phleboliths project in the pelvis. IMPRESSION: No acute fracture deformity or dislocation. Status post LEFT hip hemiarthroplasty, no radiographic findings of hardware failure. Electronically Signed   By: Awilda Metro M.D.   On: 11/05/2016 15:56    Scheduled Meds: . acetaminophen  1,000 mg Oral Q8H  . amLODipine  10 mg Oral Daily  . atorvastatin  40 mg Oral Daily  .  docusate sodium  100 mg Oral BID  . folic acid  1 mg Oral Daily  . heparin  5,000 Units Subcutaneous Q8H  . multivitamin with minerals  1 tablet Oral Daily  . pneumococcal 23 valent vaccine  0.5 mL Intramuscular Tomorrow-1000  . sodium chloride flush  3 mL Intravenous Q12H  . thiamine  100 mg Oral Daily      Time spent: 25 min  [facility]   [facility] Pager [phone]. If 7PM-7AM, please contact night-coverage at [email or web address], Office  [phone]  password [email or web address]  11/07/2016, 3:08 PM  LOS: 1 day

## 2016-11-07 NOTE — Progress Notes (Signed)
Routine EEG completed, results pending. 

## 2016-11-08 ENCOUNTER — Inpatient Hospital Stay (HOSPITAL_COMMUNITY): Payer: BLUE CROSS/BLUE SHIELD

## 2016-11-08 ENCOUNTER — Encounter (HOSPITAL_COMMUNITY): Payer: Self-pay | Admitting: Physician Assistant

## 2016-11-08 DIAGNOSIS — R001 Bradycardia, unspecified: Secondary | ICD-10-CM

## 2016-11-08 DIAGNOSIS — I340 Nonrheumatic mitral (valve) insufficiency: Secondary | ICD-10-CM

## 2016-11-08 DIAGNOSIS — R569 Unspecified convulsions: Principal | ICD-10-CM

## 2016-11-08 LAB — GLUCOSE, CAPILLARY: Glucose-Capillary: 105 mg/dL — ABNORMAL HIGH (ref 65–99)

## 2016-11-08 MED ORDER — LEVETIRACETAM 500 MG PO TABS
500.0000 mg | ORAL_TABLET | Freq: Two times a day (BID) | ORAL | Status: DC
  Administered 2016-11-08 – 2016-11-09 (×3): 500 mg via ORAL
  Filled 2016-11-08 (×3): qty 1

## 2016-11-08 MED ORDER — GADOBENATE DIMEGLUMINE 529 MG/ML IV SOLN
14.0000 mL | Freq: Once | INTRAVENOUS | Status: AC | PRN
  Administered 2016-11-08: 14 mL via INTRAVENOUS

## 2016-11-08 MED ORDER — LORAZEPAM 2 MG/ML IJ SOLN
0.5000 mg | Freq: Once | INTRAMUSCULAR | Status: AC
  Administered 2016-11-08: 0.5 mg via INTRAVENOUS

## 2016-11-08 NOTE — Progress Notes (Signed)
  Echocardiogram 2D Echocardiogram has been performed.  Pieter PartridgeBrooke S Lindell Tussey 11/08/2016, 3:11 PM

## 2016-11-08 NOTE — Consult Note (Signed)
NEURO HOSPITALIST CONSULT NOTE   Requesting physician: Dr. Sharl Ma  Reason for Consult: Falling out of bed - unknown etiology  History obtained from:  Patient and Chart    HPI:                                                                                                                                          Rose Bridges is an 56 y.o. female with a past medical history significant for alcohol abuse, migraines, osteogenesis imperfecta, HTN and hyperlipidemia presented to [facility] following three episodes of "waking up on the floor". The first of these episodes was due to falling out of bed which resulted in a comminuted fracture of the right proximal humerus. She believes she woke after landing on the floor from bed (~18" off the floor), and neither the fall nor the impact woke her. She states that she has no recollection of the second or third event. She feels less confused possibly hours following each episode, although she is alone when she wakes and can not provide accurate timelines. When she wakes, she is confused and does not know where she is. She states that she has been incontinent of urine each time she woke from these events, but this has been occurring intermittently since her left hip was replaced, just over one year ago.  Rose Bridges denies any seizure disorder. She endorses drinking approximately a "quart" of hard liquor over 2-3 days, and this has not changed in the past 6 months. She had taken depakote and antidepressants intermittently in the past for migraine and depression in the past, respectively, but states she has not taken either in approximately 20 years. She denies taking any medications at home prior to the initial fall, but now takes tramadol at home on a BID basis "at most". Lately she has been having trouble sleeping, but denies any major stressor preceding the initial episode.  At baseline, she has trouble standing and easily damages her  musculoskeletal system ("sprained ankle from pressing the clutch" in her car).  Pertinent Imaging: CT head 7/11: Negative for acute abnormality. Remote lacunar infarcts of the basal ganglia bilaterally EEG 7/11: Focal slowing over the left temporal region. Diffuse slowing of waking background MRI pending  Pertinent Medications: Tramadol 50mg  q6h PRN pain Oxycodone 5-10mg  q4h PRN pain Thiamine 100mg  daily  Past Medical History:  Diagnosis Date  . Hyperlipemia   . Hypertension   . Osteogenesis imperfecta   . Osteoporosis     Past Surgical History:  Procedure Laterality Date  . ABDOMINAL HYSTERECTOMY    . HIP ARTHROPLASTY Left 09/26/2015   Procedure: ARTHROPLASTY BIPOLAR HIP (HEMIARTHROPLASTY);  Surgeon: Sheral Apley, MD;  Location: [facility] OR;  Service: Orthopedics;  Laterality: Left;  . ORIF HUMERUS FRACTURE Right 11/06/2016  Procedure: OPEN REDUCTION INTERNAL FIXATION (ORIF) PROXIMAL HUMERUS FRACTURE;  Surgeon: Sheral Apley, MD;  Location: [facility] OR;  Service: Orthopedics;  Laterality: Right;    Family History  Problem Relation Age of Onset  . Stroke Mother   . Heart attack Mother   . Stroke Sister   . Stroke Brother     Social History:  reports that she has been smoking.  She has been smoking about 1.00 pack per day. She has never used smokeless tobacco. She reports that she drinks about 4.2 oz of alcohol per week . She reports that she does not use drugs.  Allergies  Allergen Reactions  . Codeine     "migraines"     MEDICATIONS:                                                                                                                   Current Meds  Medication Sig  . amLODipine (NORVASC) 10 MG tablet Take 10 mg by mouth daily.  Marland Kitchen atorvastatin (LIPITOR) 40 MG tablet Take 40 mg by mouth daily.  . ondansetron (ZOFRAN) 4 MG tablet Take 1 tablet (4 mg total) by mouth every 8 (eight) hours as needed for nausea or vomiting.  . traMADol (ULTRAM) 50 MG tablet Take 1  tablet (50 mg total) by mouth every 6 (six) hours as needed for severe pain.     Review Of Systems:                                                                                                           History obtained from the patient  General: Positive for fatigue. Negative for fever, weight gain or weight loss Psychological: As above Ophthalmic: Negative for blurry or double vision, loss of vision ENT: Negative for abrupt loss of hearing, tinnitus or vertigo Respiratory: Negative for cough, shortness of breath or wheezing Cardiovascular: Negative for chest pain, edema or irregular heartbeat Gastrointestinal: Currently negative for abdominal pain, diarrhea, nausea/vomiting  Genito-Urinary: As above Musculoskeletal: Fracture of the right proximal humerus. Neurological: As noted in HPI Dermatological: Negative for rash or skin changes, numbness or tingling  Blood pressure 116/68, pulse 86, temperature 98.5 F (36.9 C), temperature source Oral, resp. rate 18, height 5\' 6"  (1.676 m), weight 63.5 kg (140 lb), SpO2 95 %.   Physical Examination:  General: WDWN female. Appears calm and comfortable in bed, ice bag on right shower. Requesting pain medication. HEENT:  Normocephalic, no lesions, without obvious abnormality.  Normal external eye and conjunctiva.  Normal external ears. Normal external nose, mucus membranes and septum.  Normal pharynx. Edentulous with bottom dentures. Cardiovascular: S1, S2 normal, pulses palpable throughout   Pulmonary: chest clear, no wheezing, rales, normal symmetric air entry Abdomen: soft, non-tender Extremities: As above. Left shoulder and BLE without deformity Musculoskeletal: As above. Tone and bulk normal throughout; no atrophy noted Skin: warm and dry, no hyperpigmentation, vitiligo, or suspicious lesions  Neurological Examination:                                                                                                Mental Status: Rose Bridges is alert, oriented x 4, thought content appropriate.  Speech fluent without evidence of aphasia. Able to follow 3-step commands without difficulty. Cranial Nerves: II: Visual fields grossly normal, pupils are equal, round, reactive to light and accommodation. III,IV, VI: Ptosis not present, extra-ocular muscle movements intact bilaterally V,VII: Smile and eyebrow raise is symmetric. Facial light touch and pinprick sensation intact bilaterally VIII: Hearing grossly intact IX,X: Uvula and palate rise symmetrically XI: SCM and bilateral shoulder shrug strength 5/5 XII: Midline tongue extension Motor: Right :     Upper extremity   Not tested  Left:     Upper extremity   5/5          Lower extremity   3/5     Lower extremity  3/5 Sensory: Pinprick and light touch intact throughout, bilaterally Deep Tendon Reflexes: 2+ and symmetric throughout Plantars: Right: downgoing   Left: downgoing Cerebellar: Finger-to-nose test without evidence of dysmetria or ataxia. Gait: Deferred   Lab Results: Basic Metabolic Panel:  Recent Labs Lab 11/05/16 1715  NA 139  K 3.6  CL 96*  CO2 30  GLUCOSE 105*  BUN 10  CREATININE 0.64  CALCIUM 8.9    Liver Function Tests:  Recent Labs Lab 11/05/16 1715  AST 56*  ALT 25  ALKPHOS 141*  BILITOT 1.8*  PROT 6.4*  ALBUMIN 3.3*   No results for input(s): LIPASE, AMYLASE in the last 168 hours. No results for input(s): AMMONIA in the last 168 hours.  CBC:  Recent Labs Lab 11/05/16 1715  WBC 10.1  NEUTROABS 7.8*  HGB 14.5  HCT 43.7  MCV 112.6*  PLT 265    Cardiac Enzymes: No results for input(s): CKTOTAL, CKMB, CKMBINDEX, TROPONINI in the last 168 hours.  Lipid Panel: No results for input(s): CHOL, TRIG, HDL, CHOLHDL, VLDL, LDLCALC in the last 168 hours.  CBG:  Recent Labs Lab 11/06/16 0659 11/07/16 0558 11/08/16 0559  GLUCAP  100* 171* 105*    Microbiology: Results for orders placed or performed during the hospital encounter of 11/05/16  Surgical pcr screen     Status: None   Collection Time: 11/06/16  2:39 AM  Result Value Ref Range Status   MRSA, PCR NEGATIVE NEGATIVE Final   Staphylococcus aureus NEGATIVE NEGATIVE Final    Comment:  The Xpert SA Assay (FDA approved for NASAL specimens in patients over 28 years of age), is one component of a comprehensive surveillance program.  Test performance has been validated by [facility] for patients greater than or equal to 71 year old. It is not intended to diagnose infection nor to guide or monitor treatment.     Coagulation Studies: No results for input(s): LABPROT, INR in the last 72 hours.  Imaging: Dg Shoulder Right  Result Date: 11/06/2016 CLINICAL DATA:  Elective surgery.  Proximal right humerus fracture EXAM: DG C-ARM 61-120 MIN; RIGHT SHOULDER - 2+ VIEW COMPARISON:  None. FINDINGS: Intraoperative fluoroscopy shows a reduced proximal humerus fracture with lateral plate fixation. Inferior glenohumeral subluxation which may intentional. IMPRESSION: 1. Fluoroscopy for proximal humerus fracture ORIF. 2. Inferior glenohumeral subluxation. Electronically Signed   By: Marnee Spring M.D.   On: 11/06/2016 16:35   Ct Angio Chest Pe W Or Wo Contrast  Result Date: 11/06/2016 CLINICAL DATA:  Tachycardia, syncope. EXAM: CT ANGIOGRAPHY CHEST WITH CONTRAST TECHNIQUE: Multidetector CT imaging of the chest was performed using the standard protocol during bolus administration of intravenous contrast. Multiplanar CT image reconstructions and MIPs were obtained to evaluate the vascular anatomy. CONTRAST:  80 mL of Isovue 370 intravenously. COMPARISON:  Radiographs of November 05, 2016. FINDINGS: Cardiovascular: Satisfactory opacification of the pulmonary arteries to the segmental level. No evidence of pulmonary embolism. Normal heart size. No pericardial effusion.  Mediastinum/Nodes: No enlarged mediastinal, hilar, or axillary lymph nodes. Thyroid gland, trachea, and esophagus demonstrate no significant findings. Lungs/Pleura: No pneumothorax or pleural effusion is noted. Mild bilateral posterior basilar subsegmental atelectasis is noted. Upper Abdomen: No acute abnormality. Musculoskeletal: No chest wall abnormality. No acute or significant osseous findings. Review of the MIP images confirms the above findings. IMPRESSION: No definite evidence of pulmonary embolus. Mild bilateral posterior basilar subsegmental atelectasis. Electronically Signed   By: Lupita Raider, M.D.   On: 11/06/2016 12:44   Dg Humerus Right  Result Date: 11/06/2016 CLINICAL DATA:  Post ORIF of a closed fracture of the proximal RIGHT humerus EXAM: RIGHT HUMERUS - 2+ VIEW COMPARISON:  Portable exam at 1722 hours compared intraoperative images of 11/06/2016 FINDINGS: Plate and multiple screws/pegs at proximal RIGHT humerus post ORIF of the previously identified surgical neck fracture. Inferior subluxation of the RIGHT humeral head again seen. Question subtle nondisplaced fracture plane at the proximal humeral diaphysis at the level of the next to most distal screw. This is seen only on a single view. Diffuse osseous demineralization. IMPRESSION: Post ORIF of the proximal RIGHT humerus with persistent inferior subluxation of the RIGHT humeral head. Questionable nondisplaced fracture plane of the proximal RIGHT humeral diaphysis adjacent to the next to most distal screw, seen only on single view. Findings called to [place] RN on [facility] on 11/06/2016 at 1940 hours. Electronically Signed   By: Ulyses Southward M.D.   On: 11/06/2016 19:42   Dg C-arm 1-60 Min  Result Date: 11/06/2016 CLINICAL DATA:  Elective surgery.  Proximal right humerus fracture EXAM: DG C-ARM 61-120 MIN; RIGHT SHOULDER - 2+ VIEW COMPARISON:  None. FINDINGS: Intraoperative fluoroscopy shows a reduced proximal humerus fracture with lateral  plate fixation. Inferior glenohumeral subluxation which may intentional. IMPRESSION: 1. Fluoroscopy for proximal humerus fracture ORIF. 2. Inferior glenohumeral subluxation. Electronically Signed   By: Marnee Spring M.D.   On: 11/06/2016 16:35    Thank you for consulting the [facility] team. Assessment and plan per attending neurologist.   Bruna Potter PA-C [facility]  11/08/2016, 10:51 AM  NEUROHOSPITALIST ADDENDUM Patient seen and examined this morning. Briefly 56 year old with history of abuse, history is hypertension hyperlipidemia, migraines, osteogenesis imperfecta, fracture of the left hip, with multiple episodes of waking up on the floor without remembering the events or the reconstruction of any preceding events as well as urinary incontinence associated with it. Denies having had stopped consuming alcohol.  Review of systems as above Examination Unremarkable mental status exam Small jaw Motor exam revealed 5/5 strength in the left upper extremity, right upper extremity was not tested because of tenderness and a (over it and refused by the patient, both lower extremities are antigravity but exam is limited by pain. Sensiry intact  EEG:  Impression: This awake and asleep EEG is abnormal due to the presence of: 1. Mild diffuse slowing of the waking background 2. Rare focal slowing over the left temporal region  A&P Suspect seizure activity, especially in the setting of heavy alcohol use.  Recs: -EEG already performed and shows focal slowing. -MRI to evaluate for any underlying structural lesion -No driving for 6 months per the state law. -Keppra 500 BID given multiple events -Alcohol cessation -CIWA protocol for in hospital duration  Please call with questions  Milon DikesAshish Katherin Ramey, MD [facility] [phone]  If 7pm to 7am, please call on call as listed on AMION.

## 2016-11-08 NOTE — Progress Notes (Signed)
[facility]  PROGRESS NOTE  Rose Bridges UJW:[id number]:[id number] DOB: 01-11-1961 DOA: 11/05/2016 PCP: Rose Bridges, No Pcp Per   Brief HPI:   56 y.o. female with a PMH of alcohol abuse with multiple falls in the past, multiple fractures in the past, osteogenesis imperfecta, and hypertension with recent humeral fracture scheduled for an elective repair 11/06/16, who presented to the hospital on 11/05/16 after suffering from a syncopal episode versus a fall. Essentially, woke up on the floor with a lamp across her from a presumed fall from her bed. Screening d-dimer was 2.6 and the Rose Bridges was mildly tachycardic so she was admitted to rule out pulmonary embolism and for observation prior to her surgical intervention.     Subjective   Rose Bridges seen and examined, denies chest pain or shortness of breath. No more syncope episodes in the hospital. Telemetry strips reviewed showed episodes of bradycardia with heart rate less than 40.   Assessment/Plan:     1. SyncopeVersus seizure- no clear etiology at this time. D-dimer was elevated CTA chest was done which was negative for PE. As Rose Bridges has had this multiple times, and also tells me that every time her diaper was wet. We ordered EEG to rule out underlying seizure disorder. EEG showed focal slowing over the left temporal region indicating focal cerebral dysfunction the Rose Bridges's history of underlying structural or physiologic abnormality. Will consult neurology. Also obtain MRI brain with and without contrast. 2. Bradycardia-Rose Bridges having episodes of bradycardia, will consult cardiology for further recommendations as this may be the cause of syncope. 3. Status post ORIF proximal humerus fracture- Rose Bridges underwent ORIF of the proximal right humerus with persistent inferior subluxation of right humeral head. 4. Status post fall- multiple films done including female ribs, left shoulder left humerus, pelvis CT of the head and CT of cervical spine which were  all negative for fractures. Rose Bridges has multiple episodes of waking up on the floor. No recent increase in alcohol intake. She has not taken new medications other than tramadol but these episodes began before she started taking tramadol. 5. History of alcohol abuse- alcohol level was less than 5 on admission. She admits to drinking vodka daily. No recent increase in alcohol intake as per Rose Bridges. No signs and symptoms of alcohol withdrawal. 6. HIV screening- nonreactive    DVT prophylaxis: Heparin  Code Status: Full code  Family Communication: No family member at bedside   Disposition Plan: Likely home in 1-2 days   Consultants:    Orthopedics  Procedures:  None  Continuous infusions . lactated ringers 100 mL/hr at 11/07/16 2215      Antibiotics:   Anti-infectives    Start     Dose/Rate Route Frequency Ordered Stop   11/06/16 2000  ceFAZolin (ANCEF) IVPB 1 g/50 mL premix     1 g 100 mL/hr over 30 Minutes Intravenous Every 6 hours 11/06/16 1800 11/07/16 1529   11/06/16 0900  ceFAZolin (ANCEF) IVPB 2g/100 mL premix     2 g 200 mL/hr over 30 Minutes Intravenous On call to O.R. 11/06/16 0206 11/06/16 1500       Objective   Vitals:   11/07/16 1300 11/07/16 2047 11/08/16 0500 11/08/16 0958  BP: 125/67 (!) 105/59  116/68  Pulse: 99 86    Resp: 18 18    Temp: 98.5 F (36.9 C) 98.5 F (36.9 C)    TempSrc: Oral Oral    SpO2: 98% 95%    Weight:   63.5 kg (140 lb)   Height:  Intake/Output Summary (Last 24 hours) at 11/08/16 1406 Last data filed at 11/08/16 0900  Gross per 24 hour  Intake             1460 ml  Output              250 ml  Net             1210 ml   Filed Weights   11/05/16 1235 11/08/16 0500  Weight: 63 kg (139 lb) 63.5 kg (140 lb)     Physical Examination:  Physical Exam: Eyes: No icterus, extraocular muscles intact  Mouth: Oral mucosa is moist, no lesions on palate,  Neck: Supple, no deformities, masses, or tenderness Lungs:  Normal respiratory effort, bilateral clear to auscultation, no crackles or wheezes.  Heart: Regular rate and rhythm, S1 and S2 normal, no murmurs, rubs auscultated Abdomen: BS normoactive,soft,nondistended,non-tender to palpation,no organomegaly Extremities: Right shoulder in sling. Neuro : Alert and oriented to time, place and person, No focal deficits    Data Reviewed: I have personally reviewed following labs and imaging studies  CBG:  Recent Labs Lab 11/06/16 0659 11/07/16 0558 11/08/16 0559  GLUCAP 100* 171* 105*    CBC:  Recent Labs Lab 11/05/16 1715  WBC 10.1  NEUTROABS 7.8*  HGB 14.5  HCT 43.7  MCV 112.6*  PLT 265    Basic Metabolic Panel:  Recent Labs Lab 11/05/16 1715  NA 139  K 3.6  CL 96*  CO2 30  GLUCOSE 105*  BUN 10  CREATININE 0.64  CALCIUM 8.9    Recent Results (from the past 240 hour(s))  Surgical pcr screen     Status: None   Collection Time: 11/06/16  2:39 AM  Result Value Ref Range Status   MRSA, PCR NEGATIVE NEGATIVE Final   Staphylococcus aureus NEGATIVE NEGATIVE Final    Comment:        The Xpert SA Assay (FDA approved for NASAL specimens in patients over 37 years of age), is one component of a comprehensive surveillance program.  Test performance has been validated by [facility] for patients greater than or equal to 23 year old. It is not intended to diagnose infection nor to guide or monitor treatment.      Liver Function Tests:  Recent Labs Lab 11/05/16 1715  AST 56*  ALT 25  ALKPHOS 141*  BILITOT 1.8*  PROT 6.4*  ALBUMIN 3.3*   No results for input(s): LIPASE, AMYLASE in the last 168 hours. No results for input(s): AMMONIA in the last 168 hours.  Cardiac Enzymes: No results for input(s): CKTOTAL, CKMB, CKMBINDEX, TROPONINI in the last 168 hours. BNP (last 3 results) No results for input(s): BNP in the last 8760 hours.  ProBNP (last 3 results) No results for input(s): PROBNP in the last 8760  hours.    Studies: Dg Shoulder Right  Result Date: 11/06/2016 CLINICAL DATA:  Elective surgery.  Proximal right humerus fracture EXAM: DG C-ARM 61-120 MIN; RIGHT SHOULDER - 2+ VIEW COMPARISON:  None. FINDINGS: Intraoperative fluoroscopy shows a reduced proximal humerus fracture with lateral plate fixation. Inferior glenohumeral subluxation which may intentional. IMPRESSION: 1. Fluoroscopy for proximal humerus fracture ORIF. 2. Inferior glenohumeral subluxation. Electronically Signed   By: Marnee Spring M.D.   On: 11/06/2016 16:35   Dg Humerus Right  Result Date: 11/06/2016 CLINICAL DATA:  Post ORIF of a closed fracture of the proximal RIGHT humerus EXAM: RIGHT HUMERUS - 2+ VIEW COMPARISON:  Portable exam at 1722 hours compared intraoperative  images of 11/06/2016 FINDINGS: Plate and multiple screws/pegs at proximal RIGHT humerus post ORIF of the previously identified surgical neck fracture. Inferior subluxation of the RIGHT humeral head again seen. Question subtle nondisplaced fracture plane at the proximal humeral diaphysis at the level of the next to most distal screw. This is seen only on a single view. Diffuse osseous demineralization. IMPRESSION: Post ORIF of the proximal RIGHT humerus with persistent inferior subluxation of the RIGHT humeral head. Questionable nondisplaced fracture plane of the proximal RIGHT humeral diaphysis adjacent to the next to most distal screw, seen only on single view. Findings called to [place] RN on [facility] on 11/06/2016 at 1940 hours. Electronically Signed   By: Ulyses Southward M.D.   On: 11/06/2016 19:42   Dg C-arm 1-60 Min  Result Date: 11/06/2016 CLINICAL DATA:  Elective surgery.  Proximal right humerus fracture EXAM: DG C-ARM 61-120 MIN; RIGHT SHOULDER - 2+ VIEW COMPARISON:  None. FINDINGS: Intraoperative fluoroscopy shows a reduced proximal humerus fracture with lateral plate fixation. Inferior glenohumeral subluxation which may intentional. IMPRESSION: 1.  Fluoroscopy for proximal humerus fracture ORIF. 2. Inferior glenohumeral subluxation. Electronically Signed   By: Marnee Spring M.D.   On: 11/06/2016 16:35    Scheduled Meds: . amLODipine  10 mg Oral Daily  . atorvastatin  40 mg Oral Daily  . docusate sodium  100 mg Oral BID  . folic acid  1 mg Oral Daily  . heparin  5,000 Units Subcutaneous Q8H  . levETIRAcetam  500 mg Oral BID  . multivitamin with minerals  1 tablet Oral Daily  . sodium chloride flush  3 mL Intravenous Q12H  . thiamine  100 mg Oral Daily      Time spent: 25 min  [facility]   [facility] Pager [phone]. If 7PM-7AM, please contact night-coverage at [email or web address], Office  [phone]  password [email or web address]  11/08/2016, 2:06 PM  LOS: 2 days

## 2016-11-08 NOTE — Consult Note (Signed)
Cardiology Consultation:   Patient ID: Rose Bridges; [id number]; [birth year]   Admit date: 11/05/2016 Date of Consult: 11/08/2016  Primary Care Provider: Patient, No Pcp Per Primary Cardiologist: New Primary Electrophysiologist:  n/a   Patient Profile:   Rose Bridges is a 56 y.o. female with a hx of ETOH w/ multiple falls and fx, osteogenesis imperfecta, s/p elective humeral repair 07/10, HTN, who is being seen today for the evaluation of possible syncope at the request of Dr Sharl Ma.  History of Present Illness:   Rose Bridges has fallen 2 times in the last 3 weeks. 08/2016 fall pt feels was because she tripped over a rug, but is not completely sure.  06/23, pt woke up on the floor, had fallen out of bed. She broke her arm. She does not remember the fall. She had had some ETOH, her usual amount. She drinks a fifth every 2-3 days. She did not know where she was. After a while, she was able to maneuver around and get up. She went to the ER the next day, arm fx diagnosed.   She fell again 2 days later, did not seek help. Same situation.   The third time, she woke up on the floor. She was unable to get up, evenually called EMS and was transported to the hospital. She was on the floor for 5-6 hours before she was moved. Her adult diaper was wet.  She never had any palpitations. She was off her BP medications because of insurance issues. She was put on amlodipine 10 mg, thinks she had been on that in the past. She was not taking it during the first 2 falls, was taking it before the last fall. Since being started on the amlodipine, she has had some orthostatic dizziness.   She has had some hallucinations since being in the hospital. She has seen bugs flying around the room. She has not had any hallucinations since her surgery. She has felt cold. She has some pain in her arm and some other MS pain due to the falls. No other current issues.   She has been eating less since the first fall.  She drinks a fifth of ETOH every 2-3 days. The ETOH amount has not changed recently. She states a family member spoke with a neurologist about the hallucinations and he wondered if it was the ETOH. She realizes she needs to stop drinking.   She was incontinent after the last fall, this happens sometimes during the night. She does not always wake up before she goes.    Past Medical History:  Diagnosis Date  . Hyperlipemia   . Hypertension   . Osteogenesis imperfecta   . Osteoporosis     Past Surgical History:  Procedure Laterality Date  . ABDOMINAL HYSTERECTOMY    . HIP ARTHROPLASTY Left 09/26/2015   Procedure: ARTHROPLASTY BIPOLAR HIP (HEMIARTHROPLASTY);  Surgeon: Sheral Apley, MD;  Location: [facility] OR;  Service: Orthopedics;  Laterality: Left;  . ORIF HUMERUS FRACTURE Right 11/06/2016   Procedure: OPEN REDUCTION INTERNAL FIXATION (ORIF) PROXIMAL HUMERUS FRACTURE;  Surgeon: Sheral Apley, MD;  Location: [facility] OR;  Service: Orthopedics;  Laterality: Right;     Inpatient Medications: Scheduled Meds: . amLODipine  10 mg Oral Daily  . atorvastatin  40 mg Oral Daily  . docusate sodium  100 mg Oral BID  . folic acid  1 mg Oral Daily  . heparin  5,000 Units Subcutaneous Q8H  . levETIRAcetam  500 mg Oral BID  . multivitamin  with minerals  1 tablet Oral Daily  . sodium chloride flush  3 mL Intravenous Q12H  . thiamine  100 mg Oral Daily   Continuous Infusions: . lactated ringers 100 mL/hr at 11/07/16 2215   PRN Meds: acetaminophen **OR** acetaminophen, HYDROmorphone (DILAUDID) injection, ketorolac, LORazepam **OR** LORazepam, ondansetron (ZOFRAN) IV, oxyCODONE, traMADol Prior to Admission medications   Medication Sig Start Date End Date Taking? Authorizing Provider  amLODipine (NORVASC) 10 MG tablet Take 10 mg by mouth daily. 11/03/16  Yes [provider]  atorvastatin (LIPITOR) 40 MG tablet Take 40 mg by mouth daily. 11/03/16  Yes [provider]  ondansetron (ZOFRAN)  4 MG tablet Take 1 tablet (4 mg total) by mouth every 8 (eight) hours as needed for nausea or vomiting. 10/21/16  Yes West, Emily, PA-C  traMADol (ULTRAM) 50 MG tablet Take 1 tablet (50 mg total) by mouth every 6 (six) hours as needed for severe pain. 10/21/16  Yes West, Emily, PA-C  docusate sodium (COLACE) 100 MG capsule Take 1 capsule (100 mg total) by mouth 2 (two) times daily. To prevent constipation while taking pain medication. 11/06/16   Martensen, Lucretia Kern III, PA-C  oxyCODONE-acetaminophen (ROXICET) 5-325 MG tablet Take 1-2 tablets by mouth every 6 (six) hours as needed for severe pain. 11/06/16   Albina Billet III, PA-C    Allergies:    Allergies  Allergen Reactions  . Codeine     "migraines"     Social History:   Social History   Social History  . Marital status: Widowed    Spouse name: N/A  . Number of children: N/A  . Years of education: N/A   Occupational History  . Not employed    Social History Main Topics  . Smoking status: Current Every Day Smoker    Packs/day: 1.00  . Smokeless tobacco: Never Used  . Alcohol use 51.0 oz/week    85 Shots of liquor per week  . Drug use: No  . Sexual activity: Not Currently   Other Topics Concern  . Not on file   Social History Narrative   Pt lives with her mother.    Family History:   The patient's family history includes Heart attack in her mother; Stroke in her brother, mother, and sister. Pt indicated that her mother is alive. She indicated that her father is deceased. She indicated that the status of her sister is unknown. She indicated that the status of her brother is unknown.    ROS:  Please see the history of present illness.  All other ROS reviewed and negative.      Physical Exam/Data:   Vitals:   11/07/16 1300 11/07/16 2047 11/08/16 0500 11/08/16 0958  BP: 125/67 (!) 105/59  116/68  Pulse: 99 86    Resp: 18 18    Temp: 98.5 F (36.9 C) 98.5 F (36.9 C)    TempSrc: Oral Oral    SpO2:  98% 95%    Weight:   140 lb (63.5 kg)   Height:        Intake/Output Summary (Last 24 hours) at 11/08/16 1507 Last data filed at 11/08/16 0900  Gross per 24 hour  Intake             1460 ml  Output              200 ml  Net             1260 ml   [employer]  11/05/16 1235 11/08/16 0500  Weight: 139 lb (63 kg) 140 lb (63.5 kg)   Body mass index is 22.6 kg/m.  General:  Well nourished, well developed, in no acute distress HEENT: normal, edentulous Lymph: no adenopathy Neck: no JVD Endocrine:  No thryomegaly Vascular: No carotid bruits; 4/4 distal pulses 2+ bilaterally   Cardiac:  normal S1, S2; RRR; no murmur  Lungs:  clear to auscultation bilaterally, no wheezing, rhonchi or rales  Abd: soft, nontender, no hepatomegaly  Ext: no edema Musculoskeletal:  No deformities, RUE bandaged and dressed, not disturbed. BLE strength normal and equal Skin: warm and dry  Neuro:  CNs 2-12 intact, no focal abnormalities noted Psych:  Normal affect   EKG:  The EKG was personally reviewed and demonstrates:  SR, minor ST changes from 09/25/2015 of unclear significance Telemetry:  Telemetry was personally reviewed and demonstrates:  SR, no bradycardia  Relevant CV Studies:  ECHO ordered  Laboratory Data:  Chemistry  Recent Labs Lab 11/05/16 1715  NA 139  K 3.6  CL 96*  CO2 30  GLUCOSE 105*  BUN 10  CREATININE 0.64  CALCIUM 8.9  GFRNONAA >60  GFRAA >60  ANIONGAP 13     Recent Labs Lab 11/05/16 1715  PROT 6.4*  ALBUMIN 3.3*  AST 56*  ALT 25  ALKPHOS 141*  BILITOT 1.8*   Hematology  Recent Labs Lab 11/05/16 1715  WBC 10.1  RBC 3.88  HGB 14.5  HCT 43.7  MCV 112.6*  MCH 37.4*  MCHC 33.2  RDW 14.3  PLT 265   Cardiac Enzymes   Recent Labs Lab 11/05/16 1706 11/05/16 2037  TROPIPOC 0.01 0.01    DDimer   Recent Labs Lab 11/05/16 2024  DDIMER 2.62*    Radiology/Studies:  Dg Ribs Unilateral W/chest Left Result Date: 11/05/2016 CLINICAL DATA:   Patient fell.  Pain. EXAM: LEFT RIBS AND CHEST - 3+ VIEW COMPARISON:  Chest radiograph 10/21/2016. FINDINGS: No fracture or other bone lesions are seen involving the ribs. There is no evidence of pneumothorax or pleural effusion. Both lungs are clear. Heart size and mediastinal contours are within normal limits. Incompletely visualized RIGHT shoulder fracture occurred in June. Soft tissue calcification in both breasts. IMPRESSION: Negative for rib fracture or pneumothorax. Electronically Signed   By: Elsie Stain M.D.   On: 11/05/2016 15:57   Dg Pelvis 1-2 Views Result Date: 11/05/2016 CLINICAL DATA:  Fall from bed, on floor for 5 hours. EXAM: LEFT FEMUR 2 VIEWS; PELVIS - 1-2 VIEW COMPARISON:  LEFT pelvic radiograph Sep 26, 2015 FINDINGS: Femoral heads are well formed and located. Status post LEFT hip hemiarthroplasty with intact well-seated non cemented hardware. No periprosthetic lucency. Hip joint spaces are intact. Sacroiliac joints are symmetric. No destructive bony lesions. Included soft tissue planes are non-suspicious. Phleboliths project in the pelvis. IMPRESSION: No acute fracture deformity or dislocation. Status post LEFT hip hemiarthroplasty, no radiographic findings of hardware failure. Electronically Signed   By: Awilda Metro M.D.   On: 11/05/2016 15:56   Dg Shoulder Right Result Date: 11/06/2016 CLINICAL DATA:  Elective surgery.  Proximal right humerus fracture EXAM: DG C-ARM 61-120 MIN; RIGHT SHOULDER - 2+ VIEW COMPARISON:  None. FINDINGS: Intraoperative fluoroscopy shows a reduced proximal humerus fracture with lateral plate fixation. Inferior glenohumeral subluxation which may intentional. IMPRESSION: 1. Fluoroscopy for proximal humerus fracture ORIF. 2. Inferior glenohumeral subluxation. Electronically Signed   By: Marnee Spring M.D.   On: 11/06/2016 16:35   Ct Head Wo Contrast Result Date:  11/05/2016 CLINICAL DATA:  Pt states she believes she rolled out of bed and was on the floor  for 5 hours. This is the second time she has fallen out of bed in 2 weeks EXAM: CT HEAD WITHOUT CONTRAST CT CERVICAL SPINE WITHOUT CONTRAST TECHNIQUE: Multidetector CT imaging of the head and cervical spine was performed following the standard protocol without intravenous contrast. Multiplanar CT image reconstructions of the cervical spine were also generated. COMPARISON:  None. FINDINGS: CT HEAD FINDINGS Brain: There small remote lacunar infarcts in the basal ganglia bilaterally. There is no intra or extra-axial fluid collection or mass lesion. The basilar cisterns and ventricles have a normal appearance. There is no CT evidence for acute infarction or hemorrhage. Vascular: No hyperdense vessel or unexpected calcification. Skull: Normal. Negative for fracture or focal lesion. Sinuses/Orbits: There is mucoperiosteal thickening of the paranasal sinuses. Orbits are intact. Mastoid air cells are normally aerated. Other: Possible odontogenic cysts versus mucous retention cysts in the maxillary sinuses bilaterally. These are only partially imaged. CT CERVICAL SPINE FINDINGS Alignment: Normal. Skull base and vertebrae: No acute fracture. No primary bone lesion or focal pathologic process. Soft tissues and spinal canal: No prevertebral fluid or swelling. No visible canal hematoma. Disc levels:  Disc height loss primarily at C6-7. Upper chest: Unremarkable. Other: None IMPRESSION: 1.  No evidence for acute intracranial abnormality. 2. Remote lacunar infarcts of bilateral basal ganglia. 3.  No evidence for acute cervical spine abnormality. 4. Partially imaged odontogenic versus mucous retention cysts within the maxillary sinuses bilaterally. Electronically Signed   By: Norva PavlovElizabeth  Brown M.D.   On: 11/05/2016 16:40   Ct Angio Chest Pe W Or Wo Contrast Result Date: 11/06/2016 CLINICAL DATA:  Tachycardia, syncope. EXAM: CT ANGIOGRAPHY CHEST WITH CONTRAST TECHNIQUE: Multidetector CT imaging of the chest was performed using the  standard protocol during bolus administration of intravenous contrast. Multiplanar CT image reconstructions and MIPs were obtained to evaluate the vascular anatomy. CONTRAST:  80 mL of Isovue 370 intravenously. COMPARISON:  Radiographs of November 05, 2016. FINDINGS: Cardiovascular: Satisfactory opacification of the pulmonary arteries to the segmental level. No evidence of pulmonary embolism. Normal heart size. No pericardial effusion. Mediastinum/Nodes: No enlarged mediastinal, hilar, or axillary lymph nodes. Thyroid gland, trachea, and esophagus demonstrate no significant findings. Lungs/Pleura: No pneumothorax or pleural effusion is noted. Mild bilateral posterior basilar subsegmental atelectasis is noted. Upper Abdomen: No acute abnormality. Musculoskeletal: No chest wall abnormality. No acute or significant osseous findings. Review of the MIP images confirms the above findings. IMPRESSION: No definite evidence of pulmonary embolus. Mild bilateral posterior basilar subsegmental atelectasis. Electronically Signed   By: Lupita RaiderJames  Green Jr, M.D.   On: 11/06/2016 12:44   Ct Cervical Spine Wo Contrast Result Date: 11/05/2016 CLINICAL DATA:  Pt states she believes she rolled out of bed and was on the floor for 5 hours. This is the second time she has fallen out of bed in 2 weeks EXAM: CT HEAD WITHOUT CONTRAST CT CERVICAL SPINE WITHOUT CONTRAST TECHNIQUE: Multidetector CT imaging of the head and cervical spine was performed following the standard protocol without intravenous contrast. Multiplanar CT image reconstructions of the cervical spine were also generated. COMPARISON:  None. FINDINGS: CT HEAD FINDINGS Brain: There small remote lacunar infarcts in the basal ganglia bilaterally. There is no intra or extra-axial fluid collection or mass lesion. The basilar cisterns and ventricles have a normal appearance. There is no CT evidence for acute infarction or hemorrhage. Vascular: No hyperdense vessel or unexpected  calcification. Skull:  Normal. Negative for fracture or focal lesion. Sinuses/Orbits: There is mucoperiosteal thickening of the paranasal sinuses. Orbits are intact. Mastoid air cells are normally aerated. Other: Possible odontogenic cysts versus mucous retention cysts in the maxillary sinuses bilaterally. These are only partially imaged. CT CERVICAL SPINE FINDINGS Alignment: Normal. Skull base and vertebrae: No acute fracture. No primary bone lesion or focal pathologic process. Soft tissues and spinal canal: No prevertebral fluid or swelling. No visible canal hematoma. Disc levels:  Disc height loss primarily at C6-7. Upper chest: Unremarkable. Other: None IMPRESSION: 1.  No evidence for acute intracranial abnormality. 2. Remote lacunar infarcts of bilateral basal ganglia. 3.  No evidence for acute cervical spine abnormality. 4. Partially imaged odontogenic versus mucous retention cysts within the maxillary sinuses bilaterally. Electronically Signed   By: Norva Pavlov M.D.   On: 11/05/2016 16:40   Dg Shoulder Left Result Date: 11/05/2016 CLINICAL DATA:  Fall.  Pain. EXAM: LEFT SHOULDER - 2+ VIEW COMPARISON:  None. FINDINGS: There is no evidence of fracture or dislocation. There is no evidence of arthropathy or other focal bone abnormality. Soft tissues are unremarkable. IMPRESSION: Negative. Electronically Signed   By: Kennith Center M.D.   On: 11/05/2016 15:57   Dg Humerus Left Result Date: 11/05/2016 CLINICAL DATA:  Fall. EXAM: LEFT HUMERUS - 2+ VIEW COMPARISON:  None. FINDINGS: Bones are diffusely demineralized. No evidence for humerus fracture. No worrisome lytic or sclerotic osseous abnormality. IMPRESSION: Negative. Electronically Signed   By: Kennith Center M.D.   On: 11/05/2016 15:56   Dg Humerus Right Result Date: 11/06/2016 CLINICAL DATA:  Post ORIF of a closed fracture of the proximal RIGHT humerus EXAM: RIGHT HUMERUS - 2+ VIEW COMPARISON:  Portable exam at 1722 hours compared intraoperative  images of 11/06/2016 FINDINGS: Plate and multiple screws/pegs at proximal RIGHT humerus post ORIF of the previously identified surgical neck fracture. Inferior subluxation of the RIGHT humeral head again seen. Question subtle nondisplaced fracture plane at the proximal humeral diaphysis at the level of the next to most distal screw. This is seen only on a single view. Diffuse osseous demineralization. IMPRESSION: Post ORIF of the proximal RIGHT humerus with persistent inferior subluxation of the RIGHT humeral head. Questionable nondisplaced fracture plane of the proximal RIGHT humeral diaphysis adjacent to the next to most distal screw, seen only on single view. Findings called to [place] RN on [facility] on 11/06/2016 at 1940 hours. Electronically Signed   By: Ulyses Southward M.D.   On: 11/06/2016 19:42   Dg C-arm 1-60 Min Result Date: 11/06/2016 CLINICAL DATA:  Elective surgery.  Proximal right humerus fracture EXAM: DG C-ARM 61-120 MIN; RIGHT SHOULDER - 2+ VIEW COMPARISON:  None. FINDINGS: Intraoperative fluoroscopy shows a reduced proximal humerus fracture with lateral plate fixation. Inferior glenohumeral subluxation which may intentional. IMPRESSION: 1. Fluoroscopy for proximal humerus fracture ORIF. 2. Inferior glenohumeral subluxation. Electronically Signed   By: Marnee Spring M.D.   On: 11/06/2016 16:35   Dg Femur Min 2 Views Left Result Date: 11/05/2016 CLINICAL DATA:  Fall from bed, on floor for 5 hours. EXAM: LEFT FEMUR 2 VIEWS; PELVIS - 1-2 VIEW COMPARISON:  LEFT pelvic radiograph Sep 26, 2015 FINDINGS: Femoral heads are well formed and located. Status post LEFT hip hemiarthroplasty with intact well-seated non cemented hardware. No periprosthetic lucency. Hip joint spaces are intact. Sacroiliac joints are symmetric. No destructive bony lesions. Included soft tissue planes are non-suspicious. Phleboliths project in the pelvis. IMPRESSION: No acute fracture deformity or dislocation. Status post LEFT hip  hemiarthroplasty, no radiographic findings of hardware failure. Electronically Signed   By: Awilda Metro M.D.   On: 11/05/2016 15:56    Assessment and Plan:   1. Possible Syncope:  - pt w/ multiple falls and fx - all have occurred after she went to sleep after drinking. - pt would wake up on the floor, not realize where she was for a while, ?post-ictal state. +incontinence, but that is not unusual overnight for her - no episodes have happened during the day - no bradycardia seen on telemetry review (RN states she saw a low HR, but no alarms in the system and no sustained bradycardia on rate review) - would recommend keeping pt on telemetry for now  2. ETOH - pt states she realizes she needs to quit - had hallucinations after admission, ?2nd ETOH w/d  3. HTN - Pt was started on amlodipine 10 mg qd but had not been on rx for months.  - pt has been having orthostatic dizziness in hospital, but po intake has been poor - continue to follow, may need to adjust rx  Otherwise, per Ortho Principal Problem:   Syncope Active Problems:   Fall from slip, trip, or stumble   Closed fracture of right proximal humerus    Signed, Leanna Battles  11/08/2016 3:07 PM   Personally seen and examined. Agree with above.  56 year old female with heavy chronic alcohol use with fall, resulting in fracture. She has been seen by neurology. There was some concern about potential heart rates in the 40s. We carefully reviewed telemetry and there are no heart rates in that range. Reassuring. No bradycardia. Continue telemetry for now. ECG personally reviewed shows sinus tachycardia with nonspecific ST-T wave changes. Echocardiogram read is currently pending. She is chest pain-free, alert. Lungs are clear. No focal neurologic findings. She admits to heavy drinking. At this time, a cardiac cause for her falls are lower on the list of differential diagnoses. If telemetry reveals any significant pauses  or significant bradycardia please let us know. Otherwise. May consider stopping amlodipine 10 mg. She states that she has not been taking this for quite some time. My hunch is that her heavy alcohol use is contributing significantly to her symptoms.  We'll sign off, please let us know. Can be of further assistance.  Donato Schultz, MD

## 2016-11-09 DIAGNOSIS — W010XXA Fall on same level from slipping, tripping and stumbling without subsequent striking against object, initial encounter: Secondary | ICD-10-CM

## 2016-11-09 DIAGNOSIS — S42201A Unspecified fracture of upper end of right humerus, initial encounter for closed fracture: Secondary | ICD-10-CM

## 2016-11-09 LAB — GLUCOSE, CAPILLARY: Glucose-Capillary: 97 mg/dL (ref 65–99)

## 2016-11-09 MED ORDER — LEVETIRACETAM 500 MG PO TABS: 500.0000 mg | ORAL_TABLET | Freq: Two times a day (BID) | ORAL | 2 refills | Status: DC

## 2016-11-09 MED ORDER — FOLIC ACID 1 MG PO TABS: 1.0000 mg | ORAL_TABLET | Freq: Every day | ORAL | 2 refills | Status: DC

## 2016-11-09 MED ORDER — OXYCODONE-ACETAMINOPHEN 5-325 MG PO TABS: 1.0000 | ORAL_TABLET | Freq: Four times a day (QID) | ORAL | 0 refills | Status: DC | PRN

## 2016-11-09 MED ORDER — DOCUSATE SODIUM 100 MG PO CAPS: 100.0000 mg | ORAL_CAPSULE | Freq: Two times a day (BID) | ORAL | 1 refills | Status: DC

## 2016-11-09 NOTE — Progress Notes (Addendum)
Progress Note  Patient Name: Rose Bridges Date of Encounter: 11/09/2016  Primary Cardiologist: Otto HerbNew, Drey Shaff  Subjective   Pain in arm. No CP, no SOB, no syncope  Inpatient Medications    Scheduled Meds: . amLODipine  10 mg Oral Daily  . atorvastatin  40 mg Oral Daily  . docusate sodium  100 mg Oral BID  . folic acid  1 mg Oral Daily  . heparin  5,000 Units Subcutaneous Q8H  . levETIRAcetam  500 mg Oral BID  . multivitamin with minerals  1 tablet Oral Daily  . sodium chloride flush  3 mL Intravenous Q12H  . thiamine  100 mg Oral Daily   Continuous Infusions: . lactated ringers 100 mL/hr at 11/07/16 2215   PRN Meds: acetaminophen **OR** acetaminophen, HYDROmorphone (DILAUDID) injection, ondansetron (ZOFRAN) IV, oxyCODONE, traMADol   Vital Signs    Vitals:   11/08/16 1549 11/08/16 2041 11/08/16 2100 11/09/16 0500  BP:   130/76 125/68  Pulse:      Resp:   18 18  Temp:  98.2 F (36.8 C) 98.2 F (36.8 C) 98.7 F (37.1 C)  TempSrc:  Oral Oral Oral  SpO2: 94%  93% 94%  Weight:    159 lb (72.1 kg)  Height:        Intake/Output Summary (Last 24 hours) at 11/09/16 0801 Last data filed at 11/09/16 16100625  Gross per 24 hour  Intake              360 ml  Output             1550 ml  Net            -1190 ml   Filed Weights   11/05/16 1235 11/08/16 0500 11/09/16 0500  Weight: 139 lb (63 kg) 140 lb (63.5 kg) 159 lb (72.1 kg)    Telemetry    NSR no brady - Personally Reviewed  ECG    STach - Personally Reviewed  Physical Exam   GEN: No acute distress.  Pain from arm Neck: No JVD Cardiac: RRR, no murmurs, rubs, or gallops.  Respiratory: Clear to auscultation bilaterally. GI: Soft, nontender, non-distended  MS: No edema; No deformity. Neuro:  Nonfocal  Psych: Normal affect   Labs    Chemistry Recent Labs Lab 11/05/16 1715  NA 139  K 3.6  CL 96*  CO2 30  GLUCOSE 105*  BUN 10  CREATININE 0.64  CALCIUM 8.9  PROT 6.4*  ALBUMIN 3.3*  AST 56*    ALT 25  ALKPHOS 141*  BILITOT 1.8*  GFRNONAA >60  GFRAA >60  ANIONGAP 13     Hematology Recent Labs Lab 11/05/16 1715  WBC 10.1  RBC 3.88  HGB 14.5  HCT 43.7  MCV 112.6*  MCH 37.4*  MCHC 33.2  RDW 14.3  PLT 265    Cardiac EnzymesNo results for input(s): TROPONINI in the last 168 hours.  Recent Labs Lab 11/05/16 1706 11/05/16 2037  TROPIPOC 0.01 0.01     BNPNo results for input(s): BNP, PROBNP in the last 168 hours.   DDimer  Recent Labs Lab 11/05/16 2024  DDIMER 2.62*     Radiology    Mr Laqueta JeanBrain W Wo Contrast  Result Date: 11/09/2016 CLINICAL DATA:  Initial evaluation for acute seizure. EXAM: MRI HEAD WITHOUT AND WITH CONTRAST TECHNIQUE: Multiplanar, multiecho pulse sequences of the brain and surrounding structures were obtained without and with intravenous contrast. CONTRAST:  14mL MULTIHANCE GADOBENATE DIMEGLUMINE 529 MG/ML IV SOLN COMPARISON:  Comparison made with prior CT from 11/05/2016. FINDINGS: Brain: Diffuse prominence of the CSF containing spaces compatible with generalized age-related cerebral atrophy. Patchy T2/FLAIR hyperintensity within the periventricular and deep white matter both cerebral hemispheres most consistent with chronic small vessel ischemic disease, moderate nature. Multiple scattered remote lacunar infarcts present within the bilateral basal ganglia/ corona radiata. Additional remote lacunar infarcts involve the bilateral thalami as well is a pons. No abnormal foci of restricted diffusion to suggest acute or subacute ischemia. Gray-white matter differentiation otherwise maintained. No other evidence for remote cortical infarction. Few punctate chronic micro hemorrhages noted within the brainstem and right thalamus, likely related to underlying hypertension. No evidence for acute intracranial hemorrhage. No mass lesion, midline shift or mass effect. Ventricles normal in size without evidence for hydrocephalus. No extra-axial fluid collection. No  intrinsic temporal lobe abnormality. No abnormal enhancement. Major dural sinuses are grossly patent. Pituitary gland suprasellar region within normal limits. Vascular: Major intracranial vascular flow voids are maintained. Skull and upper cervical spine: Craniocervical junction within normal limits. Visualized upper cervical spine unremarkable. Bone marrow signal intensity within normal limits. No scalp soft tissue abnormality. Sinuses/Orbits: Globes and orbital soft tissues within normal limits. Scattered mucosal thickening throughout the paranasal sinuses. No air-fluid level to suggest acute sinusitis. No mastoid effusion. Inner ear structures normal. IMPRESSION: 1. No acute intracranial process identified. 2. 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 Electronically Signed   By: Rise Mu M.D.   On: 11/09/2016 00:17    Cardiac Studies   ECHO  - normal EF 60%  - trace tricuspid regurg  - reassuring.   Patient Profile     56 y.o. female recurrent falls, fracture  Assessment & Plan    Falls  - cardiac workup reassuring. ECHO normal, tele normal. No further evaluation needed from cardiac perspective  ETOH  - encourage cessation.   Will sign off. Please call if ?Marland Kitchen No need for cardiology follow up.   Signed, Donato Schultz, MD  11/09/2016, 8:01 AM

## 2016-11-09 NOTE — Progress Notes (Signed)
Pt ready for d/c home today per MD. Discharge instructions and prescriptions reviewed with pt and family member, all questions answered. HHPT/OT set up by CM.   SpencerHudson, Latricia HeftKorie G

## 2016-11-09 NOTE — Progress Notes (Signed)
Subjective: Laying comfortably in bed. Complains of pain in the right shoulder. No events overnight. Tolerating Keppra well.  Current Pertinent Medications: Keppra 500mg  BID  Pertinent Labs/Diagnostics: CT head 7/11: Negative for acute abnormality. Remote lacunar infarcts of the basal ganglia bilaterally EEG 7/11: Focal slowing over the left temporal region. Diffuse slowing of waking background MR brain 7/13: No acute intracranial process. Remote ischemic infarcts  Physical Examination: Vitals:   11/08/16 2100 11/09/16 0500  BP: 130/76 125/68  Pulse:    Resp: 18 18  Temp: 98.2 F (36.8 C) 98.7 F (37.1 C)    General: WDWN female. Appears calm and comfortable in bed, ice bag on right shoulder. HEENT:  Normocephalic, no lesions, without obvious abnormality.  Normal external eye and conjunctiva.  Normal external ears. Normal external nose, mucus membranes and septum.  Normal pharynx. Edentulous with bottom dentures. Cardiovascular: S1, S2 normal, pulses palpable throughout   Pulmonary: chest clear, no wheezing, rales, normal symmetric air entry Abdomen: soft, non-tender Extremities: As above. Left shoulder and BLE without deformity Musculoskeletal: As above. Tone and bulk normal throughout; no atrophy noted Skin: warm and dry, no hyperpigmentation, vitiligo, or suspicious lesions  Neurological Examination:  CN: Pupils are equal and round. They are symmetrically reactive from 3-->2 mm. EOMI without nystagmus. Facial sensation is intact to light touch. Face is symmetric at rest with normal strength and mobility. Hearing is intact to conversational voice. Palate elevates symmetrically and uvula is midline. Voice is normal in tone, pitch and quality. Bilateral SCM and trapezii are 5/5. Tongue is midline with normal bulk and mobility.  Motor: Normal bulk and tone. RUE not tested. Strength 5/5 LUE; 3/5 in BLE. Sensation: Intact to light touch.  DTRs: 2+, symmetric  Toes downgoing  bilaterally. No pathologic reflexes.  Coordination: Finger-to-nose and heel-to-shin are without dysmetria    Assessment: 56 year old female with multiple medical conditions, including alcohol abuse and migraines, but not seizure activity, reported multiple episodes of waking on the floor with no recollection of the events immediately prior and confusion for possibly hours following. In the presence of EEG with focal slowing over temporal lobe, MRI negative for structural lesion and chronic heavy alcohol abuse, it is likely that these waking episodes represent post-ictal states.  Recommendations: 1) Continue 500mg  Keppra BID 2) Alcohol and smoking cessation  3) CIWA protocol 4) Please call with any questions  Per [facility] [place] statutes, patients with seizures are not allowed to drive until they have been seizure-free for six months. Use caution when using heavy equipment or power tools. Avoid working on ladders or at heights. Take showers instead of baths. Ensure the water temperature is not too high on the home water heater. Do not go swimming alone. Maintain good sleep hygiene. Avoid alcohol. When caring for infants or small children, sit down when holding, feeding, or changing them to minimize risk of injury to the child in the event you have a seizure.   If Rose Flakelizabeth Bridges has another seizure, call 911 and bring them back to the ED if:       A.  The seizure lasts longer than 5 minutes.            B.  The patient doesn't awaken shortly after the seizure       C.  The patient has new problems such as difficulty seeing, speaking or moving following the seizure       D.  The patient was injured during the seizure  E.  The patient has a temperature over 102 F (39C)       F.  The patient vomited during the seizure and now is having trouble breathing  Bruna Potter PA-C [facility] [phone]  11/09/2016, 10:40 AM  NEUROHOSPITALIST ADDENDUM Patient seen and examined  this morning. Briefly, 56 year old with history of abuse, history is hypertension hyperlipidemia, migraines, osteogenesis imperfecta, fracture of the left hip, with multiple episodes of waking up on the floor without remembering the events or the reconstruction of any preceding events as well as urinary incontinence associated with it. Denies having had stopped consuming alcohol.  Examination Unremarkable mental status exam Small jaw Motor exam revealed 5/5 strength in the left upper extremity, right upper extremity was not tested because of tenderness and pain over it and refused by the patient, both lower extremities are antigravity but exam is limited by pain. Sensory and coordination intact  EEG:  Impression: This awake and asleep EEG is abnormal due to the presence of: 1. Mild diffuse slowing of the waking background 2. Rare focal slowing over the left temporal region  MRI brain w+w/o: chronic WM changes. No mass/stroke/bleed  Assessment: 1. Suspect seizure activity, especially in the setting of heavy alcohol use. 2. Syncope is lower on differential  Recs: -Keppra 500 BID  -Alcohol cessation -smoking cessation -No driving for 6 mo per [facility] State law. -Seizure precautions as documented above. -Consider syncope w/u as outpaitient if she continues to have symptoms even while on AEDs. -f/u with outpatient neurology in 8-12 weeks. -d/w patient in details the importance of above. She verbalized understanding. Also spoke to her sister-in-law Herbert Seta with the patient's consent and went over the plan/recs. All questions were answered.  Please call us as needed.   Milon Dikes, MD [facility] [phone]  If 7pm to 7am, please call on call as listed on AMION.

## 2016-11-09 NOTE — Care Management Note (Signed)
Case Management Note  Patient Details  Name: Rose Bridges MRN: [id number] Date of Birth: 12/27/1960  Subjective/Objective:   56 yr old female s/p right humerus ORIF                 Action/Plan:  Case manager spoke with patient concerning discharge plan. She says she has RW and 3in1. Choice was offered for Home Health agency, referral was called to Janeice RobinsonKaren Nusbaumm, [facility] Liaison. Patient lives with her mother, will return home at discharge.   Expected Discharge Date:  11/09/16               Expected Discharge Plan:  Home w Home Health Services  In-House Referral:  NA  Discharge planning Services  CM Consult  Post Acute Care Choice:  Home Health Choice offered to:  Patient  DME Arranged:  N/A (Has RW and 3in1) DME Agency:  NA  HH Arranged:  PT, OT HH Agency:  [facility]  Status of Service:  Completed, signed off  If discussed at Long Length of Stay Meetings, dates discussed:    Additional Comments:  Durenda GuthrieBrady, Estelita Iten Naomi, RN 11/09/2016, 3:24 PM

## 2016-11-09 NOTE — Progress Notes (Addendum)
NT reported to RN that pt stated "What do I need to do to stay? Or what do I need to stop doing so they don't think I can go home?" Pt assured that she would not be discharged unless she was medically ready.

## 2016-11-09 NOTE — Discharge Instructions (Signed)
Diet: As you were doing prior to hospitalization   Dressing:  Keep dressing on until follow up.  You may remove outer pad/tape in 3 days, but continue adhesive water resistant dressing until follow up.  Activity:  Increase activity slowly as tolerated, but follow the weight bearing instructions below.  The rules on driving is that you can not be taking narcotics while you drive, and you must feel in control of the vehicle.    Weight Bearing:   Non weight bearing Right Arm.  Maintain sling.  To prevent constipation: you may use a stool softener such as -  Colace (over the counter) 100 mg by mouth twice a day  Drink plenty of fluids (prune juice may be helpful) and high fiber foods Miralax (over the counter) for constipation as needed.    Itching:  If you experience itching with your medications, try taking only a single pain pill, or even half a pain pill at a time.  You may take up to 10 pain pills per day, and you can also use benadryl over the counter for itching or also to help with sleep.   Precautions:  If you experience chest pain or shortness of breath - call 911 immediately for transfer to the hospital emergency department!!  If you develop a fever greater that 101 F, purulent drainage from wound, increased redness or drainage from wound, or calf pain -- Call the office at [phone]                                                Follow- Up Appointment:  Please call for an appointment to be seen in 2 weeks [facility] - [phone]   [place] [facility] [place] statutes, patients with seizures are not allowed to drive until they have been seizure-free for six months. Use caution when using heavy equipment or power tools. Avoid working on ladders or at heights. Take showers instead of baths. Ensure the water temperature is not too high on the home water heater. Do not go swimming alone. Maintain good sleep hygiene. Avoid alcohol. When caring for infants or small children, sit down when  holding, feeding, or changing them to minimize risk of injury to the child in the event you have a seizure.   If Rose Bridges has another seizure, call 911 and bring them back to the ED if: A. The seizure lasts longer than 5 minutes.  B. The patient doesn't awaken shortly after the seizure C. The patient has new problems such as difficulty seeing, speaking or moving following the seizure D. The patient was injured during the seizure E. The patient has a temperature over 102 F (39C) F. The patient vomited during the seizure and now is having trouble breathing

## 2016-11-09 NOTE — Discharge Summary (Addendum)
Physician Discharge Summary  Rose Bridges AVW:[id number] DOB: 03/30/1961 DOA: 11/05/2016  PCP: Patient, No Pcp Per  Admit date: 11/05/2016 Discharge date: 11/09/2016  Time spent: 35* minutes  Recommendations for Outpatient Follow-up:  1. Follow-up orthopedics in 2 weeks 2. Follow up neurology in 1 month 3. We will discharge with home health PT 4. No driving for 6 months as per state law   Discharge Diagnoses:  Principal Problem: Seizure Alcohol abuse Status post fall Active Problems:   Fall from slip, trip, or stumble   Closed fracture of right proximal humerus   Discharge Condition: Stable  Diet recommendation: Heart healthy diet  Filed Weights   11/05/16 1235 11/08/16 0500 11/09/16 0500  Weight: 63 kg (139 lb) 63.5 kg (140 lb) 72.1 kg (159 lb)    History of present illness:  56 y.o.femalewith a PMH of alcohol abuse with multiple falls in the past, multiple fractures in the past, osteogenesis imperfecta, and hypertension with recent humeral fracture scheduled for an elective repair 11/06/16, who presented to the hospital on 11/05/16 after suffering from a syncopal episode versus a fall. Essentially, woke up on the floor with a lamp across her from a presumed fall from her bed. Screening d-dimer was 2.6 and the patient was mildly tachycardic so she was admitted to rule out pulmonary embolism and for observation prior to her surgical intervention.   Hospital Course:   1. Unwitnessed seizure-  patient was treated with episodes of waking up on floor with wet diapers . D-dimer was elevated CTA chest was done which was negative for PE. As patient has had this multiple times, and also told  me that every time her diaper was wet. We ordered EEG to rule out underlying seizure disorder. EEG showed focal slowing over the left temporal region indicating focal cerebral dysfunction. Neurology was consulted, and patient started on Keppra 500 mg by mouth twice a day for likely seizure from  alcohol abuse. MRI brain with and without contrast was obtained and is negative for acute abnormality. Patient is recommended not to drive for 6 months as per state law. 2. Bradycardia-patient was having having episodes of bradycardia, cardiology was consulted and at this time they have cleared patient for discharge. Echo  was also obtained which showed no significant abnormality as per cardiologist note today.  3. Status post ORIF proximal humerus fracture- patient underwent ORIF of the proximal right humerus with persistent inferior subluxation of right humeral head. Patient to follow-up with orthopedics in 2 weeks. Will also discharge on home health physical therapy 4. Status post fall- multiple films done including female ribs, left shoulder left humerus, pelvis CT of the head and CT of cervical spine which were all negative for fractures except for proximal humerus fracture as above. Patient has multiple episodes of waking up on the floor. No recent increase in alcohol intake. She has not taken new medications other than tramadol but these episodes began before she started taking tramadol. 5. History of alcohol abuse- alcohol level was less than 5 on admission. She admits to drinking vodka daily. No recent increase in alcohol intake as per patient. No signs and symptoms of alcohol withdrawal. Patient has been counseled for alcohol cessation. 6. HIV screening- nonreactive  Procedures:  Echo  Consultations:  Neurology  Cardiology  Discharge Exam: Vitals:   11/08/16 2100 11/09/16 0500  BP: 130/76 125/68  Pulse:    Resp: 18 18  Temp: 98.2 F (36.8 C) 98.7 F (37.1 C)    General: Appears  in no acute distress Cardiovascular: RRR, S1-S2 Respiratory: Clear to auscultation bilaterally  Discharge Instructions   Discharge Instructions    Diet - low sodium heart healthy    Complete by:  As directed    Discharge instructions    Complete by:  As directed    Weight Bearing: Non Weight  Bearing (NWB) Right Arm  No driving for 6 months per the state law.   Increase activity slowly    Complete by:  As directed      Current Discharge Medication List    START taking these medications   Details  docusate sodium (COLACE) 100 MG capsule Take 1 capsule (100 mg total) by mouth 2 (two) times daily. To prevent constipation while taking pain medication. Qty: 60 capsule, Refills: 0    folic acid (FOLVITE) 1 MG tablet Take 1 tablet (1 mg total) by mouth daily. Qty: 30 tablet, Refills: 2    levETIRAcetam (KEPPRA) 500 MG tablet Take 1 tablet (500 mg total) by mouth 2 (two) times daily. Qty: 60 tablet, Refills: 2    oxyCODONE-acetaminophen (ROXICET) 5-325 MG tablet Take 1-2 tablets by mouth every 6 (six) hours as needed for severe pain. Qty: 60 tablet, Refills: 0      CONTINUE these medications which have NOT CHANGED   Details  amLODipine (NORVASC) 10 MG tablet Take 10 mg by mouth daily. Refills: 1    atorvastatin (LIPITOR) 40 MG tablet Take 40 mg by mouth daily. Refills: 1    ondansetron (ZOFRAN) 4 MG tablet Take 1 tablet (4 mg total) by mouth every 8 (eight) hours as needed for nausea or vomiting. Qty: 15 tablet, Refills: 0      STOP taking these medications     traMADol (ULTRAM) 50 MG tablet        Allergies  Allergen Reactions  . Codeine     "migraines"    Follow-up Information    Sheral Apley, MD Follow up in 2 week(s).   Specialty:  Orthopedic Surgery Contact information: [address] [place] [address] [phone]            The results of significant diagnostics from this hospitalization (including imaging, microbiology, ancillary and laboratory) are listed below for reference.    Significant Diagnostic Studies: Dg Ribs Unilateral W/chest Left  Result Date: 11/05/2016 CLINICAL DATA:  Patient fell.  Pain. EXAM: LEFT RIBS AND CHEST - 3+ VIEW COMPARISON:  Chest radiograph 10/21/2016. FINDINGS: No fracture or other bone  lesions are seen involving the ribs. There is no evidence of pneumothorax or pleural effusion. Both lungs are clear. Heart size and mediastinal contours are within normal limits. Incompletely visualized RIGHT shoulder fracture occurred in June. Soft tissue calcification in both breasts. IMPRESSION: Negative for rib fracture or pneumothorax. Electronically Signed   By: Elsie Stain M.D.   On: 11/05/2016 15:57   Dg Ribs Unilateral W/chest Right  Result Date: 10/21/2016 CLINICAL DATA:  Larey Seat out of bed last night with right shoulder and arm pain. EXAM: RIGHT RIBS AND CHEST - 3+ VIEW COMPARISON:  Chest x-ray 09/25/2015 FINDINGS: Lungs are clear. Cardiomediastinal silhouette is within normal. Evidence patient's known right humeral neck/head fracture. No definite right rib fracture. IMPRESSION: No acute cardiopulmonary disease. Known right humeral neck/head fracture. Electronically Signed   By: Elberta Fortis M.D.   On: 10/21/2016 15:37   Dg Pelvis 1-2 Views  Result Date: 11/05/2016 CLINICAL DATA:  Fall from bed, on floor for 5 hours. EXAM: LEFT FEMUR  2 VIEWS; PELVIS - 1-2 VIEW COMPARISON:  LEFT pelvic radiograph Sep 26, 2015 FINDINGS: Femoral heads are well formed and located. Status post LEFT hip hemiarthroplasty with intact well-seated non cemented hardware. No periprosthetic lucency. Hip joint spaces are intact. Sacroiliac joints are symmetric. No destructive bony lesions. Included soft tissue planes are non-suspicious. Phleboliths project in the pelvis. IMPRESSION: No acute fracture deformity or dislocation. Status post LEFT hip hemiarthroplasty, no radiographic findings of hardware failure. Electronically Signed   By: Awilda Metro M.D.   On: 11/05/2016 15:56   Dg Shoulder Right  Result Date: 11/06/2016 CLINICAL DATA:  Elective surgery.  Proximal right humerus fracture EXAM: DG C-ARM 61-120 MIN; RIGHT SHOULDER - 2+ VIEW COMPARISON:  None. FINDINGS: Intraoperative fluoroscopy shows a reduced proximal  humerus fracture with lateral plate fixation. Inferior glenohumeral subluxation which may intentional. IMPRESSION: 1. Fluoroscopy for proximal humerus fracture ORIF. 2. Inferior glenohumeral subluxation. Electronically Signed   By: Marnee Spring M.D.   On: 11/06/2016 16:35   Dg Shoulder Right  Result Date: 10/21/2016 CLINICAL DATA:  Right shoulder pain secondary to a fall from bed today. Bruising. EXAM: RIGHT SHOULDER - 2+ VIEW COMPARISON:  None. FINDINGS: There is a comminuted angulated fracture of the subcapital region of the proximal right humerus. No dislocation. IMPRESSION: Comminuted proximal right humeral fracture. Electronically Signed   By: Francene Boyers M.D.   On: 10/21/2016 14:32   Ct Head Wo Contrast  Result Date: 11/05/2016 CLINICAL DATA:  Pt states she believes she rolled out of bed and was on the floor for 5 hours. This is the second time she has fallen out of bed in 2 weeks EXAM: CT HEAD WITHOUT CONTRAST CT CERVICAL SPINE WITHOUT CONTRAST TECHNIQUE: Multidetector CT imaging of the head and cervical spine was performed following the standard protocol without intravenous contrast. Multiplanar CT image reconstructions of the cervical spine were also generated. COMPARISON:  None. FINDINGS: CT HEAD FINDINGS Brain: There small remote lacunar infarcts in the basal ganglia bilaterally. There is no intra or extra-axial fluid collection or mass lesion. The basilar cisterns and ventricles have a normal appearance. There is no CT evidence for acute infarction or hemorrhage. Vascular: No hyperdense vessel or unexpected calcification. Skull: Normal. Negative for fracture or focal lesion. Sinuses/Orbits: There is mucoperiosteal thickening of the paranasal sinuses. Orbits are intact. Mastoid air cells are normally aerated. Other: Possible odontogenic cysts versus mucous retention cysts in the maxillary sinuses bilaterally. These are only partially imaged. CT CERVICAL SPINE FINDINGS Alignment: Normal. Skull  base and vertebrae: No acute fracture. No primary bone lesion or focal pathologic process. Soft tissues and spinal canal: No prevertebral fluid or swelling. No visible canal hematoma. Disc levels:  Disc height loss primarily at C6-7. Upper chest: Unremarkable. Other: None IMPRESSION: 1.  No evidence for acute intracranial abnormality. 2. Remote lacunar infarcts of bilateral basal ganglia. 3.  No evidence for acute cervical spine abnormality. 4. Partially imaged odontogenic versus mucous retention cysts within the maxillary sinuses bilaterally. Electronically Signed   By: Norva Pavlov M.D.   On: 11/05/2016 16:40   Ct Angio Chest Pe W Or Wo Contrast  Result Date: 11/06/2016 CLINICAL DATA:  Tachycardia, syncope. EXAM: CT ANGIOGRAPHY CHEST WITH CONTRAST TECHNIQUE: Multidetector CT imaging of the chest was performed using the standard protocol during bolus administration of intravenous contrast. Multiplanar CT image reconstructions and MIPs were obtained to evaluate the vascular anatomy. CONTRAST:  80 mL of Isovue 370 intravenously. COMPARISON:  Radiographs of November 05, 2016. FINDINGS: Cardiovascular:  Satisfactory opacification of the pulmonary arteries to the segmental level. No evidence of pulmonary embolism. Normal heart size. No pericardial effusion. Mediastinum/Nodes: No enlarged mediastinal, hilar, or axillary lymph nodes. Thyroid gland, trachea, and esophagus demonstrate no significant findings. Lungs/Pleura: No pneumothorax or pleural effusion is noted. Mild bilateral posterior basilar subsegmental atelectasis is noted. Upper Abdomen: No acute abnormality. Musculoskeletal: No chest wall abnormality. No acute or significant osseous findings. Review of the MIP images confirms the above findings. IMPRESSION: No definite evidence of pulmonary embolus. Mild bilateral posterior basilar subsegmental atelectasis. Electronically Signed   By: Lupita Raider, M.D.   On: 11/06/2016 12:44   Ct Cervical Spine Wo  Contrast  Result Date: 11/05/2016 CLINICAL DATA:  Pt states she believes she rolled out of bed and was on the floor for 5 hours. This is the second time she has fallen out of bed in 2 weeks EXAM: CT HEAD WITHOUT CONTRAST CT CERVICAL SPINE WITHOUT CONTRAST TECHNIQUE: Multidetector CT imaging of the head and cervical spine was performed following the standard protocol without intravenous contrast. Multiplanar CT image reconstructions of the cervical spine were also generated. COMPARISON:  None. FINDINGS: CT HEAD FINDINGS Brain: There small remote lacunar infarcts in the basal ganglia bilaterally. There is no intra or extra-axial fluid collection or mass lesion. The basilar cisterns and ventricles have a normal appearance. There is no CT evidence for acute infarction or hemorrhage. Vascular: No hyperdense vessel or unexpected calcification. Skull: Normal. Negative for fracture or focal lesion. Sinuses/Orbits: There is mucoperiosteal thickening of the paranasal sinuses. Orbits are intact. Mastoid air cells are normally aerated. Other: Possible odontogenic cysts versus mucous retention cysts in the maxillary sinuses bilaterally. These are only partially imaged. CT CERVICAL SPINE FINDINGS Alignment: Normal. Skull base and vertebrae: No acute fracture. No primary bone lesion or focal pathologic process. Soft tissues and spinal canal: No prevertebral fluid or swelling. No visible canal hematoma. Disc levels:  Disc height loss primarily at C6-7. Upper chest: Unremarkable. Other: None IMPRESSION: 1.  No evidence for acute intracranial abnormality. 2. Remote lacunar infarcts of bilateral basal ganglia. 3.  No evidence for acute cervical spine abnormality. 4. Partially imaged odontogenic versus mucous retention cysts within the maxillary sinuses bilaterally. Electronically Signed   By: Norva Pavlov M.D.   On: 11/05/2016 16:40   Mr Laqueta Jean ZO Contrast  Result Date: 11/09/2016 CLINICAL DATA:  Initial evaluation for acute  seizure. EXAM: MRI HEAD WITHOUT AND WITH CONTRAST TECHNIQUE: Multiplanar, multiecho pulse sequences of the brain and surrounding structures were obtained without and with intravenous contrast. CONTRAST:  14mL MULTIHANCE GADOBENATE DIMEGLUMINE 529 MG/ML IV SOLN COMPARISON:  Comparison made with prior CT from 11/05/2016. FINDINGS: Brain: Diffuse prominence of the CSF containing spaces compatible with generalized age-related cerebral atrophy. Patchy T2/FLAIR hyperintensity within the periventricular and deep white matter both cerebral hemispheres most consistent with chronic small vessel ischemic disease, moderate nature. Multiple scattered remote lacunar infarcts present within the bilateral basal ganglia/ corona radiata. Additional remote lacunar infarcts involve the bilateral thalami as well is a pons. No abnormal foci of restricted diffusion to suggest acute or subacute ischemia. Gray-white matter differentiation otherwise maintained. No other evidence for remote cortical infarction. Few punctate chronic micro hemorrhages noted within the brainstem and right thalamus, likely related to underlying hypertension. No evidence for acute intracranial hemorrhage. No mass lesion, midline shift or mass effect. Ventricles normal in size without evidence for hydrocephalus. No extra-axial fluid collection. No intrinsic temporal lobe abnormality. No abnormal enhancement. Major dural  sinuses are grossly patent. Pituitary gland suprasellar region within normal limits. Vascular: Major intracranial vascular flow voids are maintained. Skull and upper cervical spine: Craniocervical junction within normal limits. Visualized upper cervical spine unremarkable. Bone marrow signal intensity within normal limits. No scalp soft tissue abnormality. Sinuses/Orbits: Globes and orbital soft tissues within normal limits. Scattered mucosal thickening throughout the paranasal sinuses. No air-fluid level to suggest acute sinusitis. No mastoid  effusion. Inner ear structures normal. IMPRESSION: 1. No acute intracranial process identified. 2. 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 Electronically Signed   By: Rise Mu M.D.   On: 11/09/2016 00:17   Ct Shoulder Right Wo Contrast  Result Date: 10/26/2016 CLINICAL DATA:  Acute right shoulder pain EXAM: CT OF THE UPPER RIGHT EXTREMITY WITHOUT CONTRAST TECHNIQUE: Multidetector CT imaging of the upper right extremity was performed according to the standard protocol. COMPARISON:  None. FINDINGS: Bones/Joint/Cartilage Severely comminuted fracture of the surgical neck of the right proximal humerus with 2 cm of lateral displacement. Fracture involves the greater tuberosity with less than 10 mm of displacement. Fracture involves the lesser tuberosity. No articular surface involvement. No glenohumeral dislocation. Mild arthropathy of the acromioclavicular joint. Type II acromion. Moderate glenohumeral joint effusion. No aggressive lytic or sclerotic osseous lesion. Ligaments Ligaments are suboptimally evaluated by CT. Muscles and Tendons Muscles are normal.  No muscle atrophy. Soft tissue No fluid collection or hematoma.  No soft tissue mass. IMPRESSION: Severely comminuted fracture of the surgical neck of the right proximal humerus with 2 cm of lateral displacement. Fracture involves the greater tuberosity with less than 10 mm of displacement. Fracture involves the lesser tuberosity. No articular surface involvement. Electronically Signed   By: Elige Ko   On: 10/26/2016 15:39   Dg Shoulder Left  Result Date: 11/05/2016 CLINICAL DATA:  Fall.  Pain. EXAM: LEFT SHOULDER - 2+ VIEW COMPARISON:  None. FINDINGS: There is no evidence of fracture or dislocation. There is no evidence of arthropathy or other focal bone abnormality. Soft tissues are unremarkable. IMPRESSION: Negative. Electronically Signed    By: Kennith Center M.D.   On: 11/05/2016 15:57   Dg Humerus Left  Result Date: 11/05/2016 CLINICAL DATA:  Fall. EXAM: LEFT HUMERUS - 2+ VIEW COMPARISON:  None. FINDINGS: Bones are diffusely demineralized. No evidence for humerus fracture. No worrisome lytic or sclerotic osseous abnormality. IMPRESSION: Negative. Electronically Signed   By: Kennith Center M.D.   On: 11/05/2016 15:56   Dg Humerus Right  Result Date: 11/06/2016 CLINICAL DATA:  Post ORIF of a closed fracture of the proximal RIGHT humerus EXAM: RIGHT HUMERUS - 2+ VIEW COMPARISON:  Portable exam at 1722 hours compared intraoperative images of 11/06/2016 FINDINGS: Plate and multiple screws/pegs at proximal RIGHT humerus post ORIF of the previously identified surgical neck fracture. Inferior subluxation of the RIGHT humeral head again seen. Question subtle nondisplaced fracture plane at the proximal humeral diaphysis at the level of the next to most distal screw. This is seen only on a single view. Diffuse osseous demineralization. IMPRESSION: Post ORIF of the proximal RIGHT humerus with persistent inferior subluxation of the RIGHT humeral head. Questionable nondisplaced fracture plane of the proximal RIGHT humeral diaphysis adjacent to the next to most distal screw, seen only on single view. Findings called to [place] RN on [facility] on 11/06/2016 at 1940 hours. Electronically Signed   By: Ulyses Southward M.D.   On: 11/06/2016 19:42   Dg Humerus Right  Result Date: 10/21/2016 CLINICAL DATA:  Pt reports rolled out bed and woke up on the floor with R shoulder/upper arm pain. Large bruise noted to upper arm. Has been unable to raise arm. EXAM: RIGHT HUMERUS - 2+ VIEW COMPARISON:  None. FINDINGS: Comminuted impaction fracture of the humeral head.  No dislocation. IMPRESSION: Comminuted impaction fraction of the humeral head Electronically Signed   By: Genevive BiStewart  Edmunds M.D.   On: 10/21/2016 14:32   Dg C-arm 1-60 Min  Result Date: 11/06/2016 CLINICAL  DATA:  Elective surgery.  Proximal right humerus fracture EXAM: DG C-ARM 61-120 MIN; RIGHT SHOULDER - 2+ VIEW COMPARISON:  None. FINDINGS: Intraoperative fluoroscopy shows a reduced proximal humerus fracture with lateral plate fixation. Inferior glenohumeral subluxation which may intentional. IMPRESSION: 1. Fluoroscopy for proximal humerus fracture ORIF. 2. Inferior glenohumeral subluxation. Electronically Signed   By: Marnee SpringJonathon  Watts M.D.   On: 11/06/2016 16:35   Dg Femur Min 2 Views Left  Result Date: 11/05/2016 CLINICAL DATA:  Fall from bed, on floor for 5 hours. EXAM: LEFT FEMUR 2 VIEWS; PELVIS - 1-2 VIEW COMPARISON:  LEFT pelvic radiograph Sep 26, 2015 FINDINGS: Femoral heads are well formed and located. Status post LEFT hip hemiarthroplasty with intact well-seated non cemented hardware. No periprosthetic lucency. Hip joint spaces are intact. Sacroiliac joints are symmetric. No destructive bony lesions. Included soft tissue planes are non-suspicious. Phleboliths project in the pelvis. IMPRESSION: No acute fracture deformity or dislocation. Status post LEFT hip hemiarthroplasty, no radiographic findings of hardware failure. Electronically Signed   By: Awilda Metroourtnay  Bloomer M.D.   On: 11/05/2016 15:56    Microbiology: Recent Results (from the past 240 hour(s))  Surgical pcr screen     Status: None   Collection Time: 11/06/16  2:39 AM  Result Value Ref Range Status   MRSA, PCR NEGATIVE NEGATIVE Final   Staphylococcus aureus NEGATIVE NEGATIVE Final    Comment:        The Xpert SA Assay (FDA approved for NASAL specimens in patients over 57 years of age), is one component of a comprehensive surveillance program.  Test performance has been validated by [facility] for patients greater than or equal to 360 year old. It is not intended to diagnose infection nor to guide or monitor treatment.      Labs: Basic Metabolic Panel:  Recent Labs Lab 11/05/16 1715  NA 139  K 3.6  CL 96*  CO2 30   GLUCOSE 105*  BUN 10  CREATININE 0.64  CALCIUM 8.9   Liver Function Tests:  Recent Labs Lab 11/05/16 1715  AST 56*  ALT 25  ALKPHOS 141*  BILITOT 1.8*  PROT 6.4*  ALBUMIN 3.3*   No results for input(s): LIPASE, AMYLASE in the last 168 hours. No results for input(s): AMMONIA in the last 168 hours. CBC:  Recent Labs Lab 11/05/16 1715  WBC 10.1  NEUTROABS 7.8*  HGB 14.5  HCT 43.7  MCV 112.6*  PLT 265    CBG:  Recent Labs Lab 11/06/16 0659 11/07/16 0558 11/08/16 0559 11/09/16 0627  GLUCAP 100* 171* 105* 97       Signed:  Shaeley Segall S MD.  [facility] 11/09/2016, 10:27 AM

## 2016-11-10 LAB — ECHOCARDIOGRAM COMPLETE
Height: 66 in
Weight: 2240 oz

## 2016-12-19 ENCOUNTER — Ambulatory Visit (INDEPENDENT_AMBULATORY_CARE_PROVIDER_SITE_OTHER): Payer: BLUE CROSS/BLUE SHIELD | Admitting: Neurology

## 2016-12-19 ENCOUNTER — Encounter: Payer: Self-pay | Admitting: Neurology

## 2016-12-19 VITALS — BP 121/72 | HR 68 | Ht 66.0 in | Wt 127.5 lb

## 2016-12-19 DIAGNOSIS — R410 Disorientation, unspecified: Secondary | ICD-10-CM | POA: Diagnosis not present

## 2016-12-19 DIAGNOSIS — W19XXXS Unspecified fall, sequela: Secondary | ICD-10-CM

## 2016-12-19 DIAGNOSIS — W19XXXA Unspecified fall, initial encounter: Secondary | ICD-10-CM | POA: Insufficient documentation

## 2016-12-19 MED ORDER — LEVETIRACETAM 500 MG PO TABS: 500.0000 mg | ORAL_TABLET | Freq: Two times a day (BID) | ORAL | 11 refills | Status: DC

## 2016-12-19 NOTE — Progress Notes (Signed)
PATIENT: Rose Bridges DOB: 12/20/1960  Chief Complaint  Patient presents with  . Possible seizure activity    Reports going to sleep on 10/20/16 and waking up in the floor on 10/21/16.  She does not remember this event.  She had intense shoulder pain that prompted treatment in the ED.  Says she fractured her shoulder and had to eventually undergo surgery.  Reports two similar events occurring since that time.  She is now taking Keppra 500mg , BID.  She also reports having several episodes of loss of bladder control, over the last year, that occurred during her sleep.  Marland Kitchen PCP    Knox Royalty, MD - referred from hospital.     HISTORICAL  Rose Bridges is a 56 year old right-handed female , seen in refer by her primary care doctor  Knox Royalty for evaluation of possible seizure, initial evaluation was on December 19 2016.  She had a past medical history of hypertension, hyperlipidemia, lives with her elderly mother, work as a Geologist, engineering, she did have a history of at least moderate alcohol use in the past, a large bottle of Vodka last for 2 weeks, she had a trip and fell in May 2017 with a left femoral neck fracture, alcohol level was 54 pounds hospital admission,  She has mild unsteady gait at baseline, But since June 2018, she had 3 major episode falling off the bed without her knowing what has happened.   On October 21 2016, she woke up on the floor, with severe right shoulder pain, she did not know why she was there, felt confused, later she was found to have right humeral fracture, require surgery in July 2018.   She later also suffered to falls, woke up on the floor, confused, did not know who she get there, she did have urinary incontinence, but no tongue biting. she reported that her mother slept with her dog a different room, her mother has decreased hearing, without hearing aid, she could barely hear any sound.     Suspicious for possible seizure, Keppra 500 mg twice a day was  started since July 2018, she had no recurrent spells since.  I was able to review EEG report on November 07 2016, mild diffuse slowing of the posterior background activity, rare focal slowing over the left temporal region, there was no epileptiform discharges.  Echocardiogram ejection fraction 60-65%, wall motion was normal.  MRI of the brain November 09 2016, no acute abnormality, moderate chronic supratentorium small vessel disease, remote lacunar involving bilateral basal ganglion, thalamus, and pons  Laboratory evaluation, UDS was negative, troponin was negative, CBC showed elevated MCV 112, CMP showed elevated alkaline phosphate 180, with mild abnormal AST 56. Alcohol level less than 5. A year ago was 54.  REVIEW OF SYSTEMS: Full 14 system review of systems performed and notable only for swelling in legs, urination problems   ALLERGIES: Allergies  Allergen Reactions  . Codeine     "migraines"     HOME MEDICATIONS: Current Outpatient Prescriptions  Medication Sig Dispense Refill  . amLODipine (NORVASC) 10 MG tablet Take 10 mg by mouth daily.  1  . atorvastatin (LIPITOR) 40 MG tablet Take 40 mg by mouth daily.  1  . folic acid (FOLVITE) 1 MG tablet Take 1 tablet (1 mg total) by mouth daily. 30 tablet 2  . levETIRAcetam (KEPPRA) 500 MG tablet Take 1 tablet (500 mg total) by mouth 2 (two) times daily. 60 tablet 2  . traMADol (ULTRAM) 50 MG  tablet Take by mouth every 6 (six) hours as needed.     No current facility-administered medications for this visit.     PAST MEDICAL HISTORY: Past Medical History:  Diagnosis Date  . Hyperlipemia   . Hypertension   . Osteogenesis imperfecta   . Osteoporosis     PAST SURGICAL HISTORY: Past Surgical History:  Procedure Laterality Date  . ABDOMINAL HYSTERECTOMY    . HIP ARTHROPLASTY Left 09/26/2015   Procedure: ARTHROPLASTY BIPOLAR HIP (HEMIARTHROPLASTY);  Surgeon: Sheral Apley, MD;  Location: [facility];  Service: Orthopedics;  Laterality: Left;   . ORIF HUMERUS FRACTURE Right 11/06/2016   Procedure: OPEN REDUCTION INTERNAL FIXATION (ORIF) PROXIMAL HUMERUS FRACTURE;  Surgeon: Sheral Apley, MD;  Location: [facility] OR;  Service: Orthopedics;  Laterality: Right;    FAMILY HISTORY: Family History  Problem Relation Age of Onset  . Stroke Mother   . Heart attack Mother   . Hypertension Mother   . Stroke Sister   . Stroke Brother   . Lung cancer Father     SOCIAL HISTORY:  Social History   Social History  . Marital status: Widowed    Spouse name: N/A  . Number of children: 1  . Years of education: College   Occupational History  . Film/video editor    Social History Main Topics  . Smoking status: Current Every Day Smoker    Packs/day: 1.00  . Smokeless tobacco: Never Used  . Alcohol use No     Comment: No alcohol use since 11/04/16.  . Drug use: No  . Sexual activity: Not Currently   Other Topics Concern  . Not on file   Social History Narrative   Pt lives with her mother.   Right-handed.   1-2 bottles (16Oz each) of [facility] or Dr. Reino Kent.   Occasionally drinks Monster drinks.     PHYSICAL EXAM   Vitals:   12/19/16 1041  BP: 121/72  Pulse: 68  Weight: 127 lb 8 oz (57.8 kg)  Height: 5\' 6"  (1.676 m)    Not recorded      Body mass index is 20.58 kg/m.  PHYSICAL EXAMNIATION:  Gen: NAD, conversant, well nourised, obese, well groomed                     Cardiovascular: Regular rate rhythm, no peripheral edema, warm, nontender. Eyes: Conjunctivae clear without exudates or hemorrhage Neck: Supple, no carotid bruits. Pulmonary: Clear to auscultation bilaterally   NEUROLOGICAL EXAM:  MENTAL STATUS: Speech:    Speech is normal; fluent and spontaneous with normal comprehension.  Cognition:     Orientation to time, place and person     Normal recent and remote memory     Normal Attention span and concentration     Normal Language, naming, repeating,spontaneous speech     Fund of knowledge     CRANIAL NERVES: CN II: Visual fields are full to confrontation. Fundoscopic exam is normal with sharp discs and no vascular changes. Pupils are round equal and briskly reactive to light. CN III, IV, VI: extraocular movement are normal. No ptosis. CN V: Facial sensation is intact to pinprick in all 3 divisions bilaterally. Corneal responses are intact.  CN VII: Face is symmetric with normal eye closure and smile. CN VIII: Hearing is normal to rubbing fingers CN IX, X: Palate elevates symmetrically. Phonation is normal. CN XI: Head turning and shoulder shrug are intact CN XII: Tongue is midline with normal movements and no atrophy.  MOTOR: There is no pronator drift of out-stretched arms. Muscle bulk and tone are normal. Muscle strength is normal.  REFLEXES: Reflexes are 2+ and symmetric at the biceps, triceps, knees, and ankles. Plantar responses are flexor.  SENSORY: Intact to light touch, pinprick, positional sensation and vibratory sensation are intact in fingers and toes.  COORDINATION: Rapid alternating movements and fine finger movements are intact. There is no dysmetria on finger-to-nose and heel-knee-shin.    GAIT/STANCE: Right shoulder in sling, mildly unsteady    DIAGNOSTIC DATA (LABS, IMAGING, TESTING) - I reviewed patient records, labs, notes, testing and imaging myself where available.   ASSESSMENT AND PLAN  Tamikia Siebert is a 56 y.o. female   Unexplained episode of falling from the bed, his right shoulder fracture in June 2018   EEG showed mild background slowing, history of alcohol use  MRI of the brain showed mild generalized atrophy  She has no recurrent episodes since Keppra 500 mg twice a day, seizure is certainly a possibility  Continue Keppra 500 mg twice a day  Repeat EEG  No driving until episode free for 6 months,   Levert Feinstein, M.D. Ph.D.  [facility] [address] [place], [place] [address] Ph: [phone] Fax:  [phone]  CC: Knox Royalty, MD

## 2017-01-09 ENCOUNTER — Other Ambulatory Visit: Payer: BLUE CROSS/BLUE SHIELD

## 2017-01-16 ENCOUNTER — Ambulatory Visit (INDEPENDENT_AMBULATORY_CARE_PROVIDER_SITE_OTHER): Payer: BLUE CROSS/BLUE SHIELD | Admitting: Neurology

## 2017-01-16 DIAGNOSIS — R410 Disorientation, unspecified: Secondary | ICD-10-CM

## 2017-01-16 DIAGNOSIS — W19XXXS Unspecified fall, sequela: Secondary | ICD-10-CM

## 2017-01-18 NOTE — Procedures (Signed)
   HISTORY: 56 year old female, has transient episode of passing out  TECHNIQUE:  16 channel EEG was performed based on standard 10-16 international system. One channel was dedicated to EKG, which has demonstrates normal sinus rhythm of 84 beats per minutes.  Upon awakening, the posterior background activity was well-developed, in alpha range,reactive to eye opening and closure. But there was intermittent left frontal area higher amplitude slower, theta range activity,  There was no evidence of epileptiform discharge.  Photic stimulation was performed, which induced a symmetric photic driving.  Hyperventilation was performed, there was no abnormality elicit.  No sleep was achieved.  CONCLUSION: This is a mild abnormal study. here is no electrodiagnostic evidence of epileptiform discharge. There is evidence of mild left frontal area slowing.  Levert Feinstein, M.D. Ph.D.  [facility] [address] [place], [place] [address] Phone: [phone] Fax:      [phone]

## 2017-01-30 ENCOUNTER — Encounter: Payer: Self-pay | Admitting: Neurology

## 2017-01-31 ENCOUNTER — Telehealth: Payer: Self-pay | Admitting: *Deleted

## 2017-01-31 MED ORDER — LEVETIRACETAM 500 MG PO TABS: 500.0000 mg | ORAL_TABLET | Freq: Two times a day (BID) | ORAL | 3 refills | Status: DC

## 2017-01-31 MED ORDER — FOLIC ACID 1 MG PO TABS: 1.0000 mg | ORAL_TABLET | Freq: Every day | ORAL | 3 refills | Status: DC

## 2017-01-31 NOTE — Telephone Encounter (Signed)
Received the following email from pt: I have a question about EEG resulted on 01/16/17, 12:45 PM.  What was the reading of the new eeg? I need new refills for folic acid  and keppra  twice daily. [facility] [address]   Results: This is a mild abnormal study. here is no electrodiagnostic evidence of epileptiform discharge. There is evidence of mild left frontal area slowing.    Dr. Terrace Arabia has reviewed the chart and the patient has been informed of the following results.  She has approved the requested refills and they have been sent to the pharmacy for her.

## 2017-03-26 ENCOUNTER — Encounter: Payer: Self-pay | Admitting: Neurology

## 2017-03-26 ENCOUNTER — Ambulatory Visit (INDEPENDENT_AMBULATORY_CARE_PROVIDER_SITE_OTHER): Payer: BLUE CROSS/BLUE SHIELD | Admitting: Neurology

## 2017-03-26 VITALS — BP 150/86 | HR 80 | Ht 66.0 in | Wt 135.0 lb

## 2017-03-26 DIAGNOSIS — R569 Unspecified convulsions: Secondary | ICD-10-CM | POA: Diagnosis not present

## 2017-03-26 MED ORDER — LEVETIRACETAM 500 MG PO TABS: 500.0000 mg | ORAL_TABLET | Freq: Two times a day (BID) | ORAL | 4 refills | Status: DC

## 2017-03-26 NOTE — Progress Notes (Signed)
PATIENT: Rose Bridges DOB: 19-Jul-1960  Chief Complaint  Patient presents with  . Follow-up    PCP: Dr. Knox RoyaltyEnrico Jones     HISTORICAL  Rose Bridges is a 56 year old right-handed female , seen in refer by her primary care doctor  Knox RoyaltyJones, Enrico for evaluation of possible seizure, initial evaluation was on December 19 2016.  She had a past medical history of hypertension, hyperlipidemia, lives with her elderly mother, work as a Geologist, engineeringoffice clerk, she did have a history of at least moderate alcohol use in the past, a large bottle of Vodka last for 2 weeks, she had a trip and fell in May 2017 with a left femoral neck fracture, alcohol level was 54 pounds hospital admission,  She has mild unsteady gait at baseline, But since June 2018, she had 3 major episode falling off the bed without her knowing what has happened.   On October 21 2016, she woke up on the floor, with severe right shoulder pain, she did not know why she was there, felt confused, later she was found to have right humeral fracture, require surgery in July 2018.   She later also suffered two falls, woke up on the floor, confused, did not know who she get there, she did have urinary incontinence, but no tongue biting. she reported that her mother slept with her dog a different room, her mother has decreased hearing, without hearing aid, she could barely hear any sound.     Suspicious for possible seizure, Keppra 500 mg twice a day was started since July 2018, she had no recurrent spells since.  I was able to review EEG report on November 07 2016, mild diffuse slowing of the posterior background activity, rare focal slowing over the left temporal region, there was no epileptiform discharges.  Echocardiogram ejection fraction 60-65%, wall motion was normal.  MRI of the brain November 09 2016, no acute abnormality, moderate chronic supratentorium small vessel disease, remote lacunar involving bilateral basal ganglion, thalamus, and  pons  Laboratory evaluation, UDS was negative, troponin was negative, CBC showed elevated MCV 112, CMP showed elevated alkaline phosphate 180, with mild abnormal AST 56. Alcohol level less than 5. A year ago was 54.  UPDATE Mar 26 2017: She is able to tolerating Keppra 500 mg twice daily, there is no more falling out of bed episode, no seizure-like activity, she continued complaints of right shoulder pain, limited range of motion of her right shoulder, complains of high co-pay with Keppra   REVIEW OF SYSTEMS: Full 14 system review of systems performed and notable only for swelling in legs, urination problems   ALLERGIES: Allergies  Allergen Reactions  . Codeine     "migraines"     HOME MEDICATIONS: Current Outpatient Medications  Medication Sig Dispense Refill  . atorvastatin (LIPITOR) 40 MG tablet Take 40 mg by mouth daily.  1  . folic acid (FOLVITE) 1 MG tablet Take 1 tablet (1 mg total) by mouth daily. 90 tablet 3  . hydrochlorothiazide (HYDRODIURIL) 25 MG tablet Take 1 tablet by mouth daily.  0  . levETIRAcetam (KEPPRA) 500 MG tablet Take 1 tablet (500 mg total) by mouth 2 (two) times daily. 180 tablet 3   No current facility-administered medications for this visit.     PAST MEDICAL HISTORY: Past Medical History:  Diagnosis Date  . Hyperlipemia   . Hypertension   . Osteogenesis imperfecta   . Osteoporosis     PAST SURGICAL HISTORY: Past Surgical History:  Procedure Laterality  Date  . ABDOMINAL HYSTERECTOMY    . HIP ARTHROPLASTY Left 09/26/2015   Procedure: ARTHROPLASTY BIPOLAR HIP (HEMIARTHROPLASTY);  Surgeon: Sheral Apley, MD;  Location: [facility] OR;  Service: Orthopedics;  Laterality: Left;  . ORIF HUMERUS FRACTURE Right 11/06/2016   Procedure: OPEN REDUCTION INTERNAL FIXATION (ORIF) PROXIMAL HUMERUS FRACTURE;  Surgeon: Sheral Apley, MD;  Location: [facility] OR;  Service: Orthopedics;  Laterality: Right;    FAMILY HISTORY: Family History  Problem Relation Age of  Onset  . Stroke Mother   . Heart attack Mother   . Hypertension Mother   . Stroke Sister   . Stroke Brother   . Lung cancer Father     SOCIAL HISTORY:  Social History   Socioeconomic History  . Marital status: Widowed    Spouse name: Not on file  . Number of children: 1  . Years of education: College  . Highest education level: Not on file  Social Needs  . Financial resource strain: Not on file  . Food insecurity - worry: Not on file  . Food insecurity - inability: Not on file  . Transportation needs - medical: Not on file  . Transportation needs - non-medical: Not on file  Occupational History  . Occupation: Film/video editor  Tobacco Use  . Smoking status: Current Every Day Smoker    Packs/day: 1.00  . Smokeless tobacco: Never Used  Substance and Sexual Activity  . Alcohol use: No    Alcohol/week: 51.0 oz    Types: 85 Shots of liquor per week    Comment: No alcohol use since 11/04/16.  . Drug use: No  . Sexual activity: Not Currently  Other Topics Concern  . Not on file  Social History Narrative   Pt lives with her mother.   Right-handed.   1-2 bottles (16Oz each) of [facility] or Dr. Reino Kent.   Occasionally drinks Monster drinks.     PHYSICAL EXAM   Vitals:   03/26/17 1138  BP: (!) 150/86  Pulse: 80  Weight: 135 lb (61.2 kg)  Height: 5\' 6"  (1.676 m)    Not recorded      Body mass index is 21.79 kg/m.  PHYSICAL EXAMNIATION:  Gen: NAD, conversant, well nourised, obese, well groomed                     Cardiovascular: Regular rate rhythm, no peripheral edema, warm, nontender. Eyes: Conjunctivae clear without exudates or hemorrhage Neck: Supple, no carotid bruits. Pulmonary: Clear to auscultation bilaterally   NEUROLOGICAL EXAM:  MENTAL STATUS: Speech:    Speech is normal; fluent and spontaneous with normal comprehension.  Cognition:     Orientation to time, place and person     Normal recent and remote memory     Normal Attention span and  concentration     Normal Language, naming, repeating,spontaneous speech     Fund of knowledge   CRANIAL NERVES: CN II: Visual fields are full to confrontation. Fundoscopic exam is normal with sharp discs and no vascular changes. Pupils are round equal and briskly reactive to light. CN III, IV, VI: extraocular movement are normal. No ptosis. CN V: Facial sensation is intact to pinprick in all 3 divisions bilaterally. Corneal responses are intact.  CN VII: Face is symmetric with normal eye closure and smile. CN VIII: Hearing is normal to rubbing fingers CN IX, X: Palate elevates symmetrically. Phonation is normal. CN XI: Head turning and shoulder shrug are intact CN XII: Tongue is midline  with normal movements and no atrophy.  MOTOR: There is no pronator drift of out-stretched arms. Muscle bulk and tone are normal. Muscle strength is normal.  Limited range of motion of right shoulder  REFLEXES: Reflexes are 2+ and symmetric at the biceps, triceps, knees, and ankles. Plantar responses are flexor.  SENSORY: Intact to light touch, pinprick, positional sensation and vibratory sensation are intact in fingers and toes.  COORDINATION: Rapid alternating movements and fine finger movements are intact. There is no dysmetria on finger-to-nose and heel-knee-shin.    GAIT/STANCE: Right shoulder in sling, mildly unsteady    DIAGNOSTIC DATA (LABS, IMAGING, TESTING) - I reviewed patient records, labs, notes, testing and imaging myself where available.   ASSESSMENT AND PLAN  Rose Bridges is a 56 y.o. female   Unexplained episode of falling from the bed, his right shoulder fracture in June 2018   EEG showed mild background slowing, history of alcohol use  MRI of the brain showed mild generalized atrophy  She has no recurrent episodes since Keppra 500 mg twice a day, seizure is certainly a possibility  Continue Keppra 500 mg twice a day  No driving until episode free for 6  months,   Levert FeinsteinYijun Dainelle Hun, M.D. Ph.D.  [facility] [address], [address] [place], [place] [address] Ph: [phone] Fax: [phone]  CC: Knox RoyaltyJones, Enrico, MD

## 2017-11-12 ENCOUNTER — Encounter (HOSPITAL_COMMUNITY): Payer: Self-pay

## 2017-11-12 ENCOUNTER — Other Ambulatory Visit: Payer: Self-pay

## 2017-11-12 ENCOUNTER — Emergency Department (HOSPITAL_COMMUNITY)
Admission: EM | Admit: 2017-11-12 | Discharge: 2017-11-13 | Disposition: A | Discharge status: Home / Self Care | Payer: BLUE CROSS/BLUE SHIELD | Attending: Emergency Medicine | Admitting: Emergency Medicine

## 2017-11-12 ENCOUNTER — Emergency Department (HOSPITAL_COMMUNITY): Payer: BLUE CROSS/BLUE SHIELD

## 2017-11-12 DIAGNOSIS — Z79899 Other long term (current) drug therapy: Secondary | ICD-10-CM | POA: Diagnosis not present

## 2017-11-12 DIAGNOSIS — Y939 Activity, unspecified: Secondary | ICD-10-CM | POA: Insufficient documentation

## 2017-11-12 DIAGNOSIS — I10 Essential (primary) hypertension: Secondary | ICD-10-CM | POA: Insufficient documentation

## 2017-11-12 DIAGNOSIS — Y999 Unspecified external cause status: Secondary | ICD-10-CM | POA: Diagnosis not present

## 2017-11-12 DIAGNOSIS — F17200 Nicotine dependence, unspecified, uncomplicated: Secondary | ICD-10-CM | POA: Diagnosis not present

## 2017-11-12 DIAGNOSIS — S40022A Contusion of left upper arm, initial encounter: Secondary | ICD-10-CM | POA: Diagnosis not present

## 2017-11-12 DIAGNOSIS — Z96642 Presence of left artificial hip joint: Secondary | ICD-10-CM | POA: Insufficient documentation

## 2017-11-12 DIAGNOSIS — S62645A Nondisplaced fracture of proximal phalanx of left ring finger, initial encounter for closed fracture: Secondary | ICD-10-CM | POA: Insufficient documentation

## 2017-11-12 DIAGNOSIS — Y92014 Private driveway to single-family (private) house as the place of occurrence of the external cause: Secondary | ICD-10-CM | POA: Diagnosis not present

## 2017-11-12 DIAGNOSIS — W19XXXA Unspecified fall, initial encounter: Secondary | ICD-10-CM | POA: Insufficient documentation

## 2017-11-12 DIAGNOSIS — S6992XA Unspecified injury of left wrist, hand and finger(s), initial encounter: Secondary | ICD-10-CM | POA: Diagnosis present

## 2017-11-12 NOTE — ED Triage Notes (Signed)
Pt presents with bruising to both knees and L arm from falling in her driveway last night.  Pt unsure of why she fell, pt reports h/o seizures and is not taking her medication due to cost.

## 2017-11-13 MED ORDER — LEVETIRACETAM 500 MG PO TABS: 500.0000 mg | ORAL_TABLET | Freq: Two times a day (BID) | ORAL | 0 refills | Status: DC

## 2017-11-13 NOTE — Discharge Instructions (Signed)
Take the prescribed medication as directed. Follow-up with your primary care doctor. Also recommend to follow-up with your orthopedist regarding your finger to make sure it heals nicely. Return to the ED for new or worsening symptoms.

## 2017-11-13 NOTE — ED Provider Notes (Signed)
[facility] EMERGENCY DEPARTMENT Provider Note   CSN: [id number] Arrival date & time: 11/12/17  1612     History   Chief Complaint Chief Complaint  Patient presents with  . Fall    HPI Rose Bridges is a 58 y.o. female.  The history is provided by the patient and medical records.  Fall     57 y.o. F with hx of HTN, HLP, osteoporosis, presenting to the ED following a fall that occurred yesterday.  Patient states she apparently had a fall in her driveway yesterday afternoon.  States she thinks she likely had another of her "episodes-- ie seizure".  She has hx of seizures, has been off her meds since January.  Initially stated she was not taking it due to financial reasons, now reports her maid threw away her prescription.  States she was doing fine without it and does not really want to take it but realizes it is important.  She currently complains of left upper arm and left 4th digit pain from fall.  She denies any headache, neck pain, back pain, dizziness, weakness, confusion, numbness, weakness.  She ambulates with cane at baseline.  Past Medical History:  Diagnosis Date  . Hyperlipemia   . Hypertension   . Osteogenesis imperfecta   . Osteoporosis     Patient Active Problem List   Diagnosis Date Noted  . Seizure (HCC) 03/26/2017  . Confusion 12/19/2016  . Fall 12/19/2016  . Closed fracture of right proximal humerus 11/07/2016  . Syncope 11/05/2016  . Hip fracture (HCC) 09/25/2015  . Fall from slip, trip, or stumble   . Left displaced femoral neck fracture [facility])     Past Surgical History:  Procedure Laterality Date  . ABDOMINAL HYSTERECTOMY    . HIP ARTHROPLASTY Left 09/26/2015   Procedure: ARTHROPLASTY BIPOLAR HIP (HEMIARTHROPLASTY);  Surgeon: Sheral Apley, MD;  Location: [facility] OR;  Service: Orthopedics;  Laterality: Left;  . ORIF HUMERUS FRACTURE Right 11/06/2016   Procedure: OPEN REDUCTION INTERNAL FIXATION (ORIF) PROXIMAL HUMERUS FRACTURE;   Surgeon: Sheral Apley, MD;  Location: [facility] OR;  Service: Orthopedics;  Laterality: Right;     OB History   None      Home Medications    Prior to Admission medications   Medication Sig Start Date End Date Taking? Authorizing Provider  levETIRAcetam (KEPPRA) 500 MG tablet Take 1 tablet (500 mg total) by mouth 2 (two) times daily. 03/26/17  Yes Levert Feinstein, MD  atorvastatin (LIPITOR) 40 MG tablet Take 40 mg by mouth daily. 11/03/16   [provider]  folic acid (FOLVITE) 1 MG tablet Take 1 tablet (1 mg total) by mouth daily. Patient not taking: Reported on 11/12/2017 01/31/17   Levert Feinstein, MD  hydrochlorothiazide (HYDRODIURIL) 25 MG tablet Take 1 tablet by mouth daily. 03/02/17   [provider]    Family History Family History  Problem Relation Age of Onset  . Stroke Mother   . Heart attack Mother   . Hypertension Mother   . Stroke Sister   . Stroke Brother   . Lung cancer Father     Social History Social History   Tobacco Use  . Smoking status: Current Every Day Smoker    Packs/day: 1.00  . Smokeless tobacco: Never Used  Substance Use Topics  . Alcohol use: No    Alcohol/week: 51.0 oz    Types: 85 Shots of liquor per week    Comment: No alcohol use since 11/04/16.  . Drug  use: No     Allergies   Codeine   Review of Systems Review of Systems  Musculoskeletal: Positive for arthralgias.  All other systems reviewed and are negative.    Physical Exam Updated Vital Signs BP 128/71 (BP Location: Right Arm)   Pulse 88   Temp 97.7 F (36.5 C) (Oral)   Resp 18   Ht 5\' 5"  (1.651 m)   SpO2 97%   BMI 22.47 kg/m   Physical Exam  Constitutional: She is oriented to person, place, and time. She appears well-developed and well-nourished.  HENT:  Head: Normocephalic and atraumatic.  Mouth/Throat: Oropharynx is clear and moist.  No visible signs of head trauma  Eyes: Pupils are equal, round, and reactive to light. Conjunctivae and EOM are normal.    Neck: Normal range of motion.  Cardiovascular: Normal rate, regular rhythm and normal heart sounds.  Pulmonary/Chest: Effort normal and breath sounds normal.  Abdominal: Soft. Bowel sounds are normal.  Musculoskeletal: Normal range of motion.  Moderate sized circular area of bruising to left upper arm; locally tender, no bony deformity Full ROM of elbow and wrist Left 4th digit with diffuse bruising, able to flex/extend but with some pain; has mallet deformity at DIP joint; normal cap refill, normal distal sensation  Neurological: She is alert and oriented to person, place, and time.  AAOx3, answering questions and following commands appropriately; equal strength UE and LE bilaterally; CN grossly intact; moves all extremities appropriately without ataxia; no focal neuro deficits or facial asymmetry appreciated, ambulatory with cane which is baseline  Skin: Skin is warm and dry.  Psychiatric: She has a normal mood and affect.  Nursing note and vitals reviewed.    ED Treatments / Results  Labs (all labs ordered are listed, but only abnormal results are displayed) Labs Reviewed - No data to display  EKG None  Radiology Dg Humerus Left  Result Date: 11/12/2017 CLINICAL DATA:  Larey SeatFell yesterday.  Pain and bruising. EXAM: LEFT HUMERUS - 2+ VIEW; LEFT RING FINGER 2+V COMPARISON:  LEFT humerus radiograph November 15, 2016 FINDINGS: LEFT humerus: There is no evidence of fracture or other focal bone lesions. Osteopenia. Similar sheet like calcifications LEFT breast. LEFT fourth finger: Flexed DIP with non corticated bony fragment projecting dorsum of the middle phalanx distal aspect. Cortical regularity base of fourth distal phalanx. Mild fourth PIP osteoarthrosis. Soft tissue swelling without subcutaneous gas or radiopaque foreign bodies. Osteopenia. IMPRESSION: LEFT humerus: No acute osseous process.  Osteopenia. LEFT fourth finger: Acute avulsion fracture dorsum fourth PIP, likely from base of distal  phalanx. Fourth DIP mallet finger. Electronically Signed   By: Awilda Metroourtnay  Bloomer M.D.   On: 11/12/2017 18:32   Dg Finger Ring Left  Result Date: 11/12/2017 CLINICAL DATA:  Larey SeatFell yesterday.  Pain and bruising. EXAM: LEFT HUMERUS - 2+ VIEW; LEFT RING FINGER 2+V COMPARISON:  LEFT humerus radiograph November 15, 2016 FINDINGS: LEFT humerus: There is no evidence of fracture or other focal bone lesions. Osteopenia. Similar sheet like calcifications LEFT breast. LEFT fourth finger: Flexed DIP with non corticated bony fragment projecting dorsum of the middle phalanx distal aspect. Cortical regularity base of fourth distal phalanx. Mild fourth PIP osteoarthrosis. Soft tissue swelling without subcutaneous gas or radiopaque foreign bodies. Osteopenia. IMPRESSION: LEFT humerus: No acute osseous process.  Osteopenia. LEFT fourth finger: Acute avulsion fracture dorsum fourth PIP, likely from base of distal phalanx. Fourth DIP mallet finger. Electronically Signed   By: Awilda Metroourtnay  Bloomer M.D.   On: 11/12/2017 18:32  Procedures Procedures (including critical care time)  Medications Ordered in ED Medications - No data to display   Initial Impression / Assessment and Plan / ED Course  I have reviewed the triage vital signs and the nursing notes.  Pertinent labs & imaging results that were available during my care of the patient were reviewed by me and considered in my medical decision making (see chart for details).  57 year old female here after a fall that occurred yesterday.  States she had a "episode--i.e. seizure" yesterday.  She has been off of her medications for several months, but states she was doing well until yesterday.  He is awake, alert, appropriately oriented.  She does have some bruising of the left upper arm and left ring finger consistent with fall.  X-rays as above.  Left fourth digit was placed in static splint.  She will follow-up with her orthopedist.  In regards to her seizure, states it is been  quite a while since last seizure, even without her medications.  She has no systemic complaints at this time.  given seizure was yesterday and she is clearly back to baseline, do not feel labs will change her management today so will hold off on these.  Discussed with her that it is important to continue her medications, she is reluctant but agreeable.  I have written her a new prescription for Keppra 500 mg twice daily which she was taking previously.  She will follow-up closely with her neurologist.  She will return here for any new or acute changes.  Final Clinical Impressions(s) / ED Diagnoses   Final diagnoses:  Fall, initial encounter  Closed nondisplaced fracture of proximal phalanx of left ring finger, initial encounter  Superficial bruising of arm, left, initial encounter    ED Discharge Orders        Ordered    levETIRAcetam (KEPPRA) 500 MG tablet  2 times daily     11/13/17 0032       Garlon Hatchet, PA-C 11/13/17 0432    Dione Booze, MD 11/13/17 [phone]

## 2017-11-21 ENCOUNTER — Encounter: Payer: Self-pay | Admitting: Nurse Practitioner

## 2018-03-26 ENCOUNTER — Ambulatory Visit: Payer: BLUE CROSS/BLUE SHIELD | Admitting: Nurse Practitioner

## 2018-04-19 ENCOUNTER — Emergency Department (HOSPITAL_COMMUNITY): Payer: BLUE CROSS/BLUE SHIELD

## 2018-04-19 ENCOUNTER — Other Ambulatory Visit: Payer: Self-pay

## 2018-04-19 ENCOUNTER — Observation Stay (HOSPITAL_COMMUNITY)
Admission: EM | Admit: 2018-04-19 | Discharge: 2018-05-02 | Disposition: A | Discharge status: Home / Self Care | Payer: BLUE CROSS/BLUE SHIELD | Attending: Internal Medicine | Admitting: Internal Medicine

## 2018-04-19 DIAGNOSIS — R569 Unspecified convulsions: Secondary | ICD-10-CM | POA: Diagnosis not present

## 2018-04-19 DIAGNOSIS — K5900 Constipation, unspecified: Secondary | ICD-10-CM | POA: Insufficient documentation

## 2018-04-19 DIAGNOSIS — Q780 Osteogenesis imperfecta: Secondary | ICD-10-CM | POA: Insufficient documentation

## 2018-04-19 DIAGNOSIS — F17210 Nicotine dependence, cigarettes, uncomplicated: Secondary | ICD-10-CM | POA: Insufficient documentation

## 2018-04-19 DIAGNOSIS — S0181XA Laceration without foreign body of other part of head, initial encounter: Secondary | ICD-10-CM | POA: Diagnosis not present

## 2018-04-19 DIAGNOSIS — E860 Dehydration: Secondary | ICD-10-CM | POA: Diagnosis not present

## 2018-04-19 DIAGNOSIS — R296 Repeated falls: Secondary | ICD-10-CM | POA: Insufficient documentation

## 2018-04-19 DIAGNOSIS — M810 Age-related osteoporosis without current pathological fracture: Secondary | ICD-10-CM | POA: Diagnosis not present

## 2018-04-19 DIAGNOSIS — S32591A Other specified fracture of right pubis, initial encounter for closed fracture: Principal | ICD-10-CM | POA: Insufficient documentation

## 2018-04-19 DIAGNOSIS — S32411A Displaced fracture of anterior wall of right acetabulum, initial encounter for closed fracture: Secondary | ICD-10-CM | POA: Diagnosis not present

## 2018-04-19 DIAGNOSIS — R4020 Unspecified coma: Secondary | ICD-10-CM | POA: Diagnosis present

## 2018-04-19 DIAGNOSIS — E785 Hyperlipidemia, unspecified: Secondary | ICD-10-CM | POA: Diagnosis not present

## 2018-04-19 DIAGNOSIS — I10 Essential (primary) hypertension: Secondary | ICD-10-CM | POA: Diagnosis not present

## 2018-04-19 DIAGNOSIS — Z9114 Patient's other noncompliance with medication regimen: Secondary | ICD-10-CM | POA: Diagnosis not present

## 2018-04-19 DIAGNOSIS — S32401A Unspecified fracture of right acetabulum, initial encounter for closed fracture: Secondary | ICD-10-CM | POA: Diagnosis present

## 2018-04-19 DIAGNOSIS — S32409A Unspecified fracture of unspecified acetabulum, initial encounter for closed fracture: Secondary | ICD-10-CM | POA: Diagnosis present

## 2018-04-19 DIAGNOSIS — W06XXXA Fall from bed, initial encounter: Secondary | ICD-10-CM | POA: Diagnosis not present

## 2018-04-19 DIAGNOSIS — F1010 Alcohol abuse, uncomplicated: Secondary | ICD-10-CM | POA: Insufficient documentation

## 2018-04-19 DIAGNOSIS — Y92013 Bedroom of single-family (private) house as the place of occurrence of the external cause: Secondary | ICD-10-CM | POA: Insufficient documentation

## 2018-04-19 DIAGNOSIS — G40909 Epilepsy, unspecified, not intractable, without status epilepticus: Secondary | ICD-10-CM | POA: Insufficient documentation

## 2018-04-19 LAB — CBC WITH DIFFERENTIAL/PLATELET
Abs Immature Granulocytes: 0.03 10*3/uL (ref 0.00–0.07)
Basophils Absolute: 0 10*3/uL (ref 0.0–0.1)
Basophils Relative: 0 %
Eosinophils Absolute: 0.1 10*3/uL (ref 0.0–0.5)
Eosinophils Relative: 1 %
HCT: 54.5 % — ABNORMAL HIGH (ref 36.0–46.0)
Hemoglobin: 17.7 g/dL — ABNORMAL HIGH (ref 12.0–15.0)
Immature Granulocytes: 0 %
Lymphocytes Relative: 22 %
Lymphs Abs: 1.9 10*3/uL (ref 0.7–4.0)
MCH: 34.5 pg — AB (ref 26.0–34.0)
MCHC: 32.5 g/dL (ref 30.0–36.0)
MCV: 106.2 fL — ABNORMAL HIGH (ref 80.0–100.0)
MONO ABS: 0.5 10*3/uL (ref 0.1–1.0)
Monocytes Relative: 6 %
Neutro Abs: 6 10*3/uL (ref 1.7–7.7)
Neutrophils Relative %: 71 %
Platelets: 101 10*3/uL — ABNORMAL LOW (ref 150–400)
RBC: 5.13 MIL/uL — ABNORMAL HIGH (ref 3.87–5.11)
RDW: 11.8 % (ref 11.5–15.5)
WBC: 8.5 10*3/uL (ref 4.0–10.5)
nRBC: 0 % (ref 0.0–0.2)

## 2018-04-19 LAB — COMPREHENSIVE METABOLIC PANEL
ALT: 20 U/L (ref 0–44)
AST: 32 U/L (ref 15–41)
Albumin: 4.4 g/dL (ref 3.5–5.0)
Alkaline Phosphatase: 92 U/L (ref 38–126)
Anion gap: 17 — ABNORMAL HIGH (ref 5–15)
BUN: 8 mg/dL (ref 6–20)
CO2: 21 mmol/L — ABNORMAL LOW (ref 22–32)
Calcium: 9.4 mg/dL (ref 8.9–10.3)
Chloride: 107 mmol/L (ref 98–111)
Creatinine, Ser: 0.56 mg/dL (ref 0.44–1.00)
GFR calc non Af Amer: >60 mL/min (ref >60)
Glucose, Bld: 78 mg/dL (ref 70–99)
Potassium: 4.1 mmol/L (ref 3.5–5.1)
Sodium: 145 mmol/L (ref 135–145)
Total Bilirubin: 0.9 mg/dL (ref 0.3–1.2)
Total Protein: 7.6 g/dL (ref 6.5–8.1)

## 2018-04-19 LAB — URINALYSIS, ROUTINE W REFLEX MICROSCOPIC
Bilirubin Urine: NEGATIVE
Glucose, UA: NEGATIVE mg/dL
Hgb urine dipstick: NEGATIVE
Ketones, ur: 5 mg/dL — AB
Leukocytes, UA: NEGATIVE
Nitrite: NEGATIVE
Protein, ur: NEGATIVE mg/dL
Specific Gravity, Urine: 1.015 (ref 1.005–1.030)
pH: 5 (ref 5.0–8.0)

## 2018-04-19 LAB — I-STAT CHEM 8, ED
BUN: 6 mg/dL (ref 6–20)
Calcium, Ion: 1.09 mmol/L — ABNORMAL LOW (ref 1.15–1.40)
Chloride: 107 mmol/L (ref 98–111)
Creatinine, Ser: 0.5 mg/dL (ref 0.44–1.00)
Glucose, Bld: 86 mg/dL (ref 70–99)
HCT: 47 % — ABNORMAL HIGH (ref 36.0–46.0)
Hemoglobin: 16 g/dL — ABNORMAL HIGH (ref 12.0–15.0)
Potassium: 3.9 mmol/L (ref 3.5–5.1)
SODIUM: 143 mmol/L (ref 135–145)
TCO2: 26 mmol/L (ref 22–32)

## 2018-04-19 LAB — ETHANOL: Alcohol, Ethyl (B): 31 mg/dL — ABNORMAL HIGH (ref <10)

## 2018-04-19 LAB — CBG MONITORING, ED: Glucose-Capillary: 84 mg/dL (ref 70–99)

## 2018-04-19 LAB — MAGNESIUM: Magnesium: 1.7 mg/dL (ref 1.7–2.4)

## 2018-04-19 MED ORDER — ONDANSETRON HCL 4 MG/2ML IJ SOLN: 4.0000 mg | Freq: Four times a day (QID) | INTRAMUSCULAR | Status: DC | PRN

## 2018-04-19 MED ORDER — VITAMIN B-1 100 MG PO TABS
100.0000 mg | ORAL_TABLET | Freq: Every day | ORAL | Status: DC
  Administered 2018-04-20 – 2018-05-02 (×13): 100 mg via ORAL
  Filled 2018-04-19 (×13): qty 1

## 2018-04-19 MED ORDER — ADULT MULTIVITAMIN W/MINERALS CH: 1.0000 | ORAL_TABLET | Freq: Every day | ORAL | Status: DC

## 2018-04-19 MED ORDER — THIAMINE HCL 100 MG/ML IJ SOLN
100.0000 mg | Freq: Every day | INTRAMUSCULAR | Status: DC
  Filled 2018-04-19 (×2): qty 2

## 2018-04-19 MED ORDER — LIDOCAINE-EPINEPHRINE (PF) 2 %-1:200000 IJ SOLN
15.0000 mL | Freq: Once | INTRAMUSCULAR | Status: AC
  Administered 2018-04-19: 15 mL
  Filled 2018-04-19: qty 20

## 2018-04-19 MED ORDER — DIAZEPAM 5 MG PO TABS
5.0000 mg | ORAL_TABLET | Freq: Once | ORAL | Status: AC
  Administered 2018-04-19: 5 mg via ORAL
  Filled 2018-04-19: qty 1

## 2018-04-19 MED ORDER — TETANUS-DIPHTH-ACELL PERTUSSIS 5-2.5-18.5 LF-MCG/0.5 IM SUSP
0.5000 mL | Freq: Once | INTRAMUSCULAR | Status: AC
  Administered 2018-04-19: 0.5 mL via INTRAMUSCULAR
  Filled 2018-04-19: qty 0.5

## 2018-04-19 MED ORDER — SODIUM CHLORIDE 0.9% FLUSH
3.0000 mL | Freq: Two times a day (BID) | INTRAVENOUS | Status: DC
  Administered 2018-04-20 – 2018-05-02 (×19): 3 mL via INTRAVENOUS

## 2018-04-19 MED ORDER — LEVETIRACETAM IN NACL 1000 MG/100ML IV SOLN
1000.0000 mg | Freq: Once | INTRAVENOUS | Status: AC
  Administered 2018-04-19: 1000 mg via INTRAVENOUS
  Filled 2018-04-19: qty 100

## 2018-04-19 MED ORDER — METHOCARBAMOL 500 MG PO TABS
500.0000 mg | ORAL_TABLET | Freq: Four times a day (QID) | ORAL | Status: DC
  Administered 2018-04-19 – 2018-04-21 (×6): 500 mg via ORAL
  Filled 2018-04-19 (×6): qty 1

## 2018-04-19 MED ORDER — ADULT MULTIVITAMIN W/MINERALS CH
1.0000 | ORAL_TABLET | Freq: Every day | ORAL | Status: DC
  Administered 2018-04-20 – 2018-05-02 (×13): 1 via ORAL
  Filled 2018-04-19 (×13): qty 1

## 2018-04-19 MED ORDER — LEVETIRACETAM 500 MG PO TABS
500.0000 mg | ORAL_TABLET | Freq: Two times a day (BID) | ORAL | Status: DC
  Administered 2018-04-19 – 2018-05-02 (×26): 500 mg via ORAL
  Filled 2018-04-19 (×26): qty 1

## 2018-04-19 MED ORDER — SODIUM CHLORIDE 0.9 % IV SOLN: 250.0000 mL | INTRAVENOUS | Status: DC | PRN

## 2018-04-19 MED ORDER — SODIUM CHLORIDE 0.9 % IV BOLUS
1000.0000 mL | Freq: Once | INTRAVENOUS | Status: AC
  Administered 2018-04-19: 1000 mL via INTRAVENOUS

## 2018-04-19 MED ORDER — HYDROCODONE-ACETAMINOPHEN 5-325 MG PO TABS: 1.0000 | ORAL_TABLET | Freq: Once | ORAL | Status: DC

## 2018-04-19 MED ORDER — LIDOCAINE-EPINEPHRINE-TETRACAINE (LET) SOLUTION
3.0000 mL | Freq: Once | NASAL | Status: AC
  Administered 2018-04-19: 3 mL via TOPICAL
  Filled 2018-04-19: qty 3

## 2018-04-19 MED ORDER — OXYCODONE HCL 5 MG PO TABS
5.0000 mg | ORAL_TABLET | ORAL | Status: DC | PRN
  Administered 2018-04-19 – 2018-05-01 (×34): 5 mg via ORAL
  Filled 2018-04-19 (×35): qty 1

## 2018-04-19 MED ORDER — LORAZEPAM 1 MG PO TABS: 1.0000 mg | ORAL_TABLET | Freq: Four times a day (QID) | ORAL | Status: AC | PRN

## 2018-04-19 MED ORDER — LORAZEPAM 2 MG/ML IJ SOLN
1.0000 mg | Freq: Four times a day (QID) | INTRAMUSCULAR | Status: AC | PRN
  Administered 2018-04-20: 1 mg via INTRAVENOUS
  Filled 2018-04-19: qty 1

## 2018-04-19 MED ORDER — ACETAMINOPHEN 650 MG RE SUPP
650.0000 mg | Freq: Four times a day (QID) | RECTAL | Status: DC | PRN
  Filled 2018-04-19: qty 1

## 2018-04-19 MED ORDER — POLYETHYLENE GLYCOL 3350 17 G PO PACK
17.0000 g | PACK | Freq: Every day | ORAL | Status: DC
  Administered 2018-04-20 – 2018-05-02 (×9): 17 g via ORAL
  Filled 2018-04-19 (×12): qty 1

## 2018-04-19 MED ORDER — SODIUM CHLORIDE 0.9 % IV SOLN
INTRAVENOUS | Status: DC
  Administered 2018-04-19 – 2018-04-21 (×3): via INTRAVENOUS
  Administered 2018-04-22: 75 mL/h via INTRAVENOUS

## 2018-04-19 MED ORDER — VITAMIN B-1 100 MG PO TABS: 100.0000 mg | ORAL_TABLET | Freq: Every day | ORAL | Status: DC

## 2018-04-19 MED ORDER — TRAZODONE HCL 50 MG PO TABS
50.0000 mg | ORAL_TABLET | Freq: Every evening | ORAL | Status: DC | PRN
  Administered 2018-04-19 – 2018-04-30 (×9): 50 mg via ORAL
  Filled 2018-04-19 (×9): qty 1

## 2018-04-19 MED ORDER — FOLIC ACID 1 MG PO TABS
1.0000 mg | ORAL_TABLET | Freq: Every day | ORAL | Status: DC
  Administered 2018-04-20 – 2018-05-02 (×13): 1 mg via ORAL
  Filled 2018-04-19 (×13): qty 1

## 2018-04-19 MED ORDER — FOLIC ACID 1 MG PO TABS: 1.0000 mg | ORAL_TABLET | Freq: Every day | ORAL | Status: DC

## 2018-04-19 MED ORDER — HYDRALAZINE HCL 20 MG/ML IJ SOLN: 10.0000 mg | Freq: Four times a day (QID) | INTRAMUSCULAR | Status: DC | PRN

## 2018-04-19 MED ORDER — MORPHINE SULFATE (PF) 2 MG/ML IV SOLN
2.0000 mg | Freq: Once | INTRAVENOUS | Status: AC
  Administered 2018-04-19: 2 mg via INTRAVENOUS
  Filled 2018-04-19: qty 1

## 2018-04-19 MED ORDER — ALBUTEROL SULFATE (2.5 MG/3ML) 0.083% IN NEBU: 2.5000 mg | INHALATION_SOLUTION | RESPIRATORY_TRACT | Status: DC | PRN

## 2018-04-19 MED ORDER — ONDANSETRON HCL 4 MG PO TABS: 4.0000 mg | ORAL_TABLET | Freq: Four times a day (QID) | ORAL | Status: DC | PRN

## 2018-04-19 MED ORDER — BISACODYL 10 MG RE SUPP
10.0000 mg | Freq: Once | RECTAL | Status: AC
  Administered 2018-04-20: 10 mg via RECTAL
  Filled 2018-04-19: qty 1

## 2018-04-19 MED ORDER — SODIUM CHLORIDE 0.9% FLUSH: 3.0000 mL | INTRAVENOUS | Status: DC | PRN

## 2018-04-19 MED ORDER — ACETAMINOPHEN 325 MG PO TABS
650.0000 mg | ORAL_TABLET | Freq: Four times a day (QID) | ORAL | Status: DC | PRN
  Administered 2018-04-21 – 2018-04-29 (×4): 650 mg via ORAL
  Filled 2018-04-19 (×4): qty 2

## 2018-04-19 MED ORDER — SODIUM CHLORIDE 0.9 % IV SOLN
INTRAVENOUS | Status: DC
  Administered 2018-04-19: 15:00:00 via INTRAVENOUS

## 2018-04-19 MED ORDER — POLYETHYLENE GLYCOL 3350 17 G PO PACK: 17.0000 g | PACK | Freq: Every day | ORAL | Status: DC | PRN

## 2018-04-19 MED ORDER — HEPARIN SODIUM (PORCINE) 5000 UNIT/ML IJ SOLN
5000.0000 [IU] | Freq: Three times a day (TID) | INTRAMUSCULAR | Status: DC
  Administered 2018-04-19 – 2018-05-02 (×38): 5000 [IU] via SUBCUTANEOUS
  Filled 2018-04-19 (×38): qty 1

## 2018-04-19 NOTE — H&P (Signed)
Patient Demographics:    Rose Bridges, is a 57 y.o. female  MRN: [id number]   DOB - 04-17-1961  Admit Date - 04/19/2018  Outpatient Primary MD for the patient is Knox Royalty, MD   Assessment & Plan:    Principal Problem:   Acetabular fracture [facility]) Active Problems:   Seizure (HCC)    1)Recurrent falls--- suspect alcohol-related falls, syncope and/or seizures much less likely , echocardiogram from July 2018 with preserved EF over 60% and no significant aortic stenosis , get carotid artery Dopplers , patient will need PT,  OT eval after she has been cleared by orthopedic surgeon  2)Rt anterior acetabular fracture--- EDP discussed with on-call orthopedic surgeon, official orthopedic consult pending, advised nonweightbearing for now, unlikely to require surgical intervention, methocarbamol and opiates as ordered for pain control  3)H/o SZ--- apparently was not very compliant with Keppra prior to admission, received Keppra load in the ED, okay to resume Keppra 500 twice daily  4)Alcohol abuse--- see noted with elevated MCV MCH, patient admits to drinking at least 4 ounces of vodka on a daily basis, lorazepam per CIWA protocol, thiamine and folic acid as ordered, patient is not ready to quit drinking alcohol, patient's recurrent falls and musculoskeletal injuries are most likely related to alcohol intoxication and abuse rather than frank seizures or syncope  5) history of osteogenesis imperfecta and history of osteoporosis--- given recurrent falls patient is at risk for fractures  6) history of hypertension--- she was on no BP medications prior to admission,   may use IV Hydralazine 10 mg  Every 4 hours Prn for systolic blood pressure over 170 mmhg  NB labs/chemistry not available at the time of my  evaluation  With History of - Reviewed by me  Past Medical History:  Diagnosis Date  . Hyperlipemia   . Hypertension   . Osteogenesis imperfecta   . Osteoporosis       Past Surgical History:  Procedure Laterality Date  . ABDOMINAL HYSTERECTOMY    . HIP ARTHROPLASTY Left 09/26/2015   Procedure: ARTHROPLASTY BIPOLAR HIP (HEMIARTHROPLASTY);  Surgeon: Sheral Apley, MD;  Location: [facility] OR;  Service: Orthopedics;  Laterality: Left;  . ORIF HUMERUS FRACTURE Right 11/06/2016   Procedure: OPEN REDUCTION INTERNAL FIXATION (ORIF) PROXIMAL HUMERUS FRACTURE;  Surgeon: Sheral Apley, MD;  Location: [facility] OR;  Service: Orthopedics;  Laterality: Right;      Chief Complaint  Patient presents with  . Seizures  . Hip Pain      HPI:    Rose Bridges  is a 57 y.o. female with past medical history relevant for osteogenesis imperfecta, osteoporosis, hypertension, dyslipidemia, and ongoing alcohol abuse as well as history of seizures and noncompliance with Keppra presents to the ED after falling at home  Patient tells me she does not remember what happened last evening....... she remembers drinking all the  leftover from an old bottle of vodka, she then opened a new bottle of  vodka and poured more from that new bottle of vodka....... she went to the bathroom couple times while drinking her vodka...Marland KitchenMarland KitchenMarland Kitchen the next thing she remembers is waking up in the floor in the morning with right hip pain and bleeding from her chin from a 2 cm laceration  Denies tongue biting or fecal incontinence, denies confusional episodes.... Denies chest pains palpitations dizziness or syncopal type symptoms   Again she remembers drinking vodka then she remembers going back and forth to the bathroom and then denies change remembers is waking up on the floor with Rt hip pain  ED Course--CBC with elevated MCV and MCH, labs/chemistry not available yet,  CT pelvis with right acetabular fracture, EDP discussed case with  on-call orthopedic surgeon who advised admission to hospitalist service and orthopedic consult in a.m. CT head, CT C-spine, CT maxillofacial bones as well as chest x-ray without acute findings   CT Pelvis Subtle acute fracture of the anterior right acetabulum with subtle displacement. Subacute to chronic right inferior pubic ramus Fracture. Moderate fecal retention over the rectum.    Review of systems:    In addition to the HPI above,   A full Review of  Systems was done, all other systems reviewed are negative except as noted above in HPI , .    Social History:  Reviewed by me    Social History   Tobacco Use  . Smoking status: Current Every Day Smoker    Packs/day: 1.00  . Smokeless tobacco: Never Used  Substance Use Topics  . Alcohol use: No    Alcohol/week: 85.0 standard drinks    Types: 85 Shots of liquor per week    Comment: No alcohol use since 11/04/16.     Family History :  Reviewed by me    Family History  Problem Relation Age of Onset  . Stroke Mother   . Heart attack Mother   . Hypertension Mother   . Stroke Sister   . Stroke Brother   . Lung cancer Father     Home Medications:   Prior to Admission medications   Medication Sig Start Date End Date Taking? Authorizing Provider  levETIRAcetam (KEPPRA) 500 MG tablet Take 1 tablet (500 mg total) by mouth 2 (two) times daily. Patient taking differently: Take 500 mg by mouth daily.  11/13/17  Yes Garlon Hatchet, PA-C  folic acid (FOLVITE) 1 MG tablet Take 1 tablet (1 mg total) by mouth daily. Patient not taking: Reported on 11/12/2017 01/31/17   Levert Feinstein, MD     Allergies:     Allergies  Allergen Reactions  . Codeine     "migraines"      Physical Exam:   Vitals  Blood pressure (!) 152/74, pulse 78, temperature 97.8 F (36.6 C), temperature source Oral, resp. rate 13, SpO2 97 %.  Physical Examination: General appearance - alert, well appearing, and in no distress  Mental status - alert,  oriented to person, place, and time,  Eyes - sclera anicteric Mouth--edentulous, bottom dentures Neck - supple, no JVD elevation , Chest - clear  to auscultation bilaterally, symmetrical air movement,  Heart - S1 and S2 normal, regular Abdomen - soft, nontender, nondistended, no masses or organomegaly Neurological - screening mental status exam normal, neck supple without rigidity, cranial nerves II through XII intact, DTR's normal and symmetric Extremities - no pedal edema noted, intact peripheral pulses, There is no erythema or edema noted over the right hip. Patient with tenderness palpation of her right anterior  hip.  Patient resistant to flexion, extension, internal and external rotation.  Patient resistant to logrolling.   Skin -Chin Laceration--has been sutured by EDP, it is hemostatic    Data Review:    CBC Recent Labs  Lab 04/19/18 1230 04/19/18 1300  WBC SPECIMEN CLOTTED 8.5  HGB SPECIMEN CLOTTED 17.7*  HCT SPECIMEN CLOTTED 54.5*  PLT SPECIMEN CLOTTED 101*  MCV SPECIMEN CLOTTED 106.2*  MCH SPECIMEN CLOTTED 34.5*  MCHC SPECIMEN CLOTTED 32.5  RDW SPECIMEN CLOTTED 11.8  LYMPHSABS PENDING 1.9  MONOABS PENDING 0.5  EOSABS PENDING 0.1  BASOSABS PENDING 0.0   ------------------------------------------------------------------------------------------------------------------   ---------------------------------------------------------------------------------------------------------------  Urinalysis    Component Value Date/Time   COLORURINE YELLOW 04/19/2018 1134   APPEARANCEUR CLEAR 04/19/2018 1134   LABSPEC 1.015 04/19/2018 1134   PHURINE 5.0 04/19/2018 1134   GLUCOSEU NEGATIVE 04/19/2018 1134   HGBUR NEGATIVE 04/19/2018 1134   BILIRUBINUR NEGATIVE 04/19/2018 1134   KETONESUR 5 (A) 04/19/2018 1134   PROTEINUR NEGATIVE 04/19/2018 1134   NITRITE NEGATIVE 04/19/2018 1134   LEUKOCYTESUR NEGATIVE 04/19/2018 1134     ----------------------------------------------------------------------------------------------------------------   Imaging Results:    Dg Chest 1 View  Result Date: 04/19/2018 CLINICAL DATA:  Seizure last night.  Seizure disorder. EXAM: CHEST  1 VIEW COMPARISON:  Seven hundred nineteen FINDINGS: The heart size and mediastinal contours are within normal limits. Both lungs are clear. Internal fixation plate and screws seen in the proximal right humerus. IMPRESSION: No active disease. Electronically Signed   By: Myles RosenthalJohn  Stahl M.D.   On: 04/19/2018 13:20   Ct Head Wo Contrast  Result Date: 04/19/2018 CLINICAL DATA:  Seizure activity, found on floor, headache and neck pain, chin laceration EXAM: CT HEAD WITHOUT CONTRAST CT MAXILLOFACIAL WITHOUT CONTRAST CT CERVICAL SPINE WITHOUT CONTRAST TECHNIQUE: Multidetector CT imaging of the head, cervical spine, and maxillofacial structures were performed using the standard protocol without intravenous contrast. Multiplanar CT image reconstructions of the cervical spine and maxillofacial structures were also generated. COMPARISON:  11/05/2016 FINDINGS: CT HEAD FINDINGS Brain: Stable atrophy, chronic white matter microvascular change, and basal ganglia lacunar-type infarcts bilaterally. No acute intracranial hemorrhage, new mass lesion, definite new infarction, midline shift, herniation, hydrocephalus, or extra-axial fluid collection. No focal mass effect or edema. Cisterns are patent. No cerebellar abnormality. Vascular: No hyperdense vessel or unexpected calcification. Skull: Normal. Negative for fracture or focal lesion. Other: None. CT MAXILLOFACIAL FINDINGS Osseous: No fracture or mandibular dislocation. No destructive process. Orbits: Negative. No traumatic or inflammatory finding. Sinuses: Chronic maxillary retention cysts or polyps noted. Soft tissues: No significant soft tissue asymmetry, bruising, swelling, or hematoma. All teeth have been extracted.  Dentures noted. CT CERVICAL SPINE FINDINGS Alignment: Normal. Skull base and vertebrae: No acute fracture. No primary bone lesion or focal pathologic process. Soft tissues and spinal canal: No prevertebral fluid or swelling. No visible canal hematoma. Disc levels: Mild degenerative spondylosis, most pronounced at C5-6 and C6-7 with disc space narrowing, sclerosis and endplate osteophytes. Preserved vertebral body heights. Facets are aligned. No subluxation or dislocation. No acute osseous finding or fracture. Upper chest: Negative. Other: None. IMPRESSION: Atrophy and chronic white matter microvascular changes. Remote basal ganglia lacunar type infarcts. No acute intracranial abnormality by noncontrast CT. No acute facial bony trauma or fracture. Chronic maxillary retention cysts or polyps. Cervical degenerative spondylosis without acute osseous finding or malalignment. Negative for fracture. Electronically Signed   By: Judie PetitM.  Shick M.D.   On: 04/19/2018 13:40   Ct Cervical Spine Wo Contrast  Result Date: 04/19/2018 CLINICAL  DATA:  Seizure activity, found on floor, headache and neck pain, chin laceration EXAM: CT HEAD WITHOUT CONTRAST CT MAXILLOFACIAL WITHOUT CONTRAST CT CERVICAL SPINE WITHOUT CONTRAST TECHNIQUE: Multidetector CT imaging of the head, cervical spine, and maxillofacial structures were performed using the standard protocol without intravenous contrast. Multiplanar CT image reconstructions of the cervical spine and maxillofacial structures were also generated. COMPARISON:  11/05/2016 FINDINGS: CT HEAD FINDINGS Brain: Stable atrophy, chronic white matter microvascular change, and basal ganglia lacunar-type infarcts bilaterally. No acute intracranial hemorrhage, new mass lesion, definite new infarction, midline shift, herniation, hydrocephalus, or extra-axial fluid collection. No focal mass effect or edema. Cisterns are patent. No cerebellar abnormality. Vascular: No hyperdense vessel or unexpected  calcification. Skull: Normal. Negative for fracture or focal lesion. Other: None. CT MAXILLOFACIAL FINDINGS Osseous: No fracture or mandibular dislocation. No destructive process. Orbits: Negative. No traumatic or inflammatory finding. Sinuses: Chronic maxillary retention cysts or polyps noted. Soft tissues: No significant soft tissue asymmetry, bruising, swelling, or hematoma. All teeth have been extracted. Dentures noted. CT CERVICAL SPINE FINDINGS Alignment: Normal. Skull base and vertebrae: No acute fracture. No primary bone lesion or focal pathologic process. Soft tissues and spinal canal: No prevertebral fluid or swelling. No visible canal hematoma. Disc levels: Mild degenerative spondylosis, most pronounced at C5-6 and C6-7 with disc space narrowing, sclerosis and endplate osteophytes. Preserved vertebral body heights. Facets are aligned. No subluxation or dislocation. No acute osseous finding or fracture. Upper chest: Negative. Other: None. IMPRESSION: Atrophy and chronic white matter microvascular changes. Remote basal ganglia lacunar type infarcts. No acute intracranial abnormality by noncontrast CT. No acute facial bony trauma or fracture. Chronic maxillary retention cysts or polyps. Cervical degenerative spondylosis without acute osseous finding or malalignment. Negative for fracture. Electronically Signed   By: Judie Petit.  Shick M.D.   On: 04/19/2018 13:40   Ct Pelvis Wo Contrast  Result Date: 04/19/2018 CLINICAL DATA:  Seizure last night with right hip pain. EXAM: CT PELVIS WITHOUT CONTRAST TECHNIQUE: Multidetector CT imaging of the pelvis was performed following the standard protocol without intravenous contrast. COMPARISON:  Plain films 04/19/2018 and 11/05/2016 FINDINGS: Urinary Tract:  No abnormality visualized. Bowel: Moderate fecal retention over the rectum. Appendix is normal. Visualized small bowel is normal. Vascular/Lymphatic: Subtle calcified plaque at the aortic bifurcation. No adenopathy.  Reproductive:  Previous hysterectomy. Other:  No free pelvic fluid. Musculoskeletal: Examination demonstrates left hip arthroplasty intact and normally located. There is a subacute to chronic fracture of the right inferior pubic ramus. There is a subtle very minimally displaced acute fracture of the anterior right acetabulum seen best on the coronal images. IMPRESSION: Subtle acute fracture of the anterior right acetabulum with subtle displacement. Subacute to chronic right inferior pubic ramus fracture. Moderate fecal retention over the rectum. Electronically Signed   By: Elberta Fortis M.D.   On: 04/19/2018 14:44   Dg Hip Unilat W Or Wo Pelvis 2-3 Views Right  Result Date: 04/19/2018 CLINICAL DATA:  Fall last night.  Right hip pain. Initial encounter. EXAM: DG HIP (WITH OR WITHOUT PELVIS) 2-3V RIGHT COMPARISON:  None. FINDINGS: There is no evidence of hip fracture or dislocation. A nondisplaced fracture of the right inferior pubic ramus shows mild sclerosis, consistent with a healing subacute fracture. IMPRESSION: No evidence of right hip fracture or dislocation. Subacute, healing fracture of the right inferior pubic ramus. Electronically Signed   By: Myles Rosenthal M.D.   On: 04/19/2018 13:19   Ct Maxillofacial Wo Contrast  Result Date: 04/19/2018 CLINICAL DATA:  Seizure  activity, found on floor, headache and neck pain, chin laceration EXAM: CT HEAD WITHOUT CONTRAST CT MAXILLOFACIAL WITHOUT CONTRAST CT CERVICAL SPINE WITHOUT CONTRAST TECHNIQUE: Multidetector CT imaging of the head, cervical spine, and maxillofacial structures were performed using the standard protocol without intravenous contrast. Multiplanar CT image reconstructions of the cervical spine and maxillofacial structures were also generated. COMPARISON:  11/05/2016 FINDINGS: CT HEAD FINDINGS Brain: Stable atrophy, chronic white matter microvascular change, and basal ganglia lacunar-type infarcts bilaterally. No acute intracranial hemorrhage,  new mass lesion, definite new infarction, midline shift, herniation, hydrocephalus, or extra-axial fluid collection. No focal mass effect or edema. Cisterns are patent. No cerebellar abnormality. Vascular: No hyperdense vessel or unexpected calcification. Skull: Normal. Negative for fracture or focal lesion. Other: None. CT MAXILLOFACIAL FINDINGS Osseous: No fracture or mandibular dislocation. No destructive process. Orbits: Negative. No traumatic or inflammatory finding. Sinuses: Chronic maxillary retention cysts or polyps noted. Soft tissues: No significant soft tissue asymmetry, bruising, swelling, or hematoma. All teeth have been extracted. Dentures noted. CT CERVICAL SPINE FINDINGS Alignment: Normal. Skull base and vertebrae: No acute fracture. No primary bone lesion or focal pathologic process. Soft tissues and spinal canal: No prevertebral fluid or swelling. No visible canal hematoma. Disc levels: Mild degenerative spondylosis, most pronounced at C5-6 and C6-7 with disc space narrowing, sclerosis and endplate osteophytes. Preserved vertebral body heights. Facets are aligned. No subluxation or dislocation. No acute osseous finding or fracture. Upper chest: Negative. Other: None. IMPRESSION: Atrophy and chronic white matter microvascular changes. Remote basal ganglia lacunar type infarcts. No acute intracranial abnormality by noncontrast CT. No acute facial bony trauma or fracture. Chronic maxillary retention cysts or polyps. Cervical degenerative spondylosis without acute osseous finding or malalignment. Negative for fracture. Electronically Signed   By: Judie PetitM.  Shick M.D.   On: 04/19/2018 13:40    Radiological Exams on Admission: Dg Chest 1 View  Result Date: 04/19/2018 CLINICAL DATA:  Seizure last night.  Seizure disorder. EXAM: CHEST  1 VIEW COMPARISON:  Seven hundred nineteen FINDINGS: The heart size and mediastinal contours are within normal limits. Both lungs are clear. Internal fixation plate and  screws seen in the proximal right humerus. IMPRESSION: No active disease. Electronically Signed   By: Myles RosenthalJohn  Stahl M.D.   On: 04/19/2018 13:20   Ct Head Wo Contrast  Result Date: 04/19/2018 CLINICAL DATA:  Seizure activity, found on floor, headache and neck pain, chin laceration EXAM: CT HEAD WITHOUT CONTRAST CT MAXILLOFACIAL WITHOUT CONTRAST CT CERVICAL SPINE WITHOUT CONTRAST TECHNIQUE: Multidetector CT imaging of the head, cervical spine, and maxillofacial structures were performed using the standard protocol without intravenous contrast. Multiplanar CT image reconstructions of the cervical spine and maxillofacial structures were also generated. COMPARISON:  11/05/2016 FINDINGS: CT HEAD FINDINGS Brain: Stable atrophy, chronic white matter microvascular change, and basal ganglia lacunar-type infarcts bilaterally. No acute intracranial hemorrhage, new mass lesion, definite new infarction, midline shift, herniation, hydrocephalus, or extra-axial fluid collection. No focal mass effect or edema. Cisterns are patent. No cerebellar abnormality. Vascular: No hyperdense vessel or unexpected calcification. Skull: Normal. Negative for fracture or focal lesion. Other: None. CT MAXILLOFACIAL FINDINGS Osseous: No fracture or mandibular dislocation. No destructive process. Orbits: Negative. No traumatic or inflammatory finding. Sinuses: Chronic maxillary retention cysts or polyps noted. Soft tissues: No significant soft tissue asymmetry, bruising, swelling, or hematoma. All teeth have been extracted. Dentures noted. CT CERVICAL SPINE FINDINGS Alignment: Normal. Skull base and vertebrae: No acute fracture. No primary bone lesion or focal pathologic process. Soft tissues and spinal canal:  No prevertebral fluid or swelling. No visible canal hematoma. Disc levels: Mild degenerative spondylosis, most pronounced at C5-6 and C6-7 with disc space narrowing, sclerosis and endplate osteophytes. Preserved vertebral body heights. Facets  are aligned. No subluxation or dislocation. No acute osseous finding or fracture. Upper chest: Negative. Other: None. IMPRESSION: Atrophy and chronic white matter microvascular changes. Remote basal ganglia lacunar type infarcts. No acute intracranial abnormality by noncontrast CT. No acute facial bony trauma or fracture. Chronic maxillary retention cysts or polyps. Cervical degenerative spondylosis without acute osseous finding or malalignment. Negative for fracture. Electronically Signed   By: Judie Petit.  Shick M.D.   On: 04/19/2018 13:40   Ct Cervical Spine Wo Contrast  Result Date: 04/19/2018 CLINICAL DATA:  Seizure activity, found on floor, headache and neck pain, chin laceration EXAM: CT HEAD WITHOUT CONTRAST CT MAXILLOFACIAL WITHOUT CONTRAST CT CERVICAL SPINE WITHOUT CONTRAST TECHNIQUE: Multidetector CT imaging of the head, cervical spine, and maxillofacial structures were performed using the standard protocol without intravenous contrast. Multiplanar CT image reconstructions of the cervical spine and maxillofacial structures were also generated. COMPARISON:  11/05/2016 FINDINGS: CT HEAD FINDINGS Brain: Stable atrophy, chronic white matter microvascular change, and basal ganglia lacunar-type infarcts bilaterally. No acute intracranial hemorrhage, new mass lesion, definite new infarction, midline shift, herniation, hydrocephalus, or extra-axial fluid collection. No focal mass effect or edema. Cisterns are patent. No cerebellar abnormality. Vascular: No hyperdense vessel or unexpected calcification. Skull: Normal. Negative for fracture or focal lesion. Other: None. CT MAXILLOFACIAL FINDINGS Osseous: No fracture or mandibular dislocation. No destructive process. Orbits: Negative. No traumatic or inflammatory finding. Sinuses: Chronic maxillary retention cysts or polyps noted. Soft tissues: No significant soft tissue asymmetry, bruising, swelling, or hematoma. All teeth have been extracted. Dentures noted. CT  CERVICAL SPINE FINDINGS Alignment: Normal. Skull base and vertebrae: No acute fracture. No primary bone lesion or focal pathologic process. Soft tissues and spinal canal: No prevertebral fluid or swelling. No visible canal hematoma. Disc levels: Mild degenerative spondylosis, most pronounced at C5-6 and C6-7 with disc space narrowing, sclerosis and endplate osteophytes. Preserved vertebral body heights. Facets are aligned. No subluxation or dislocation. No acute osseous finding or fracture. Upper chest: Negative. Other: None. IMPRESSION: Atrophy and chronic white matter microvascular changes. Remote basal ganglia lacunar type infarcts. No acute intracranial abnormality by noncontrast CT. No acute facial bony trauma or fracture. Chronic maxillary retention cysts or polyps. Cervical degenerative spondylosis without acute osseous finding or malalignment. Negative for fracture. Electronically Signed   By: Judie Petit.  Shick M.D.   On: 04/19/2018 13:40   Ct Pelvis Wo Contrast  Result Date: 04/19/2018 CLINICAL DATA:  Seizure last night with right hip pain. EXAM: CT PELVIS WITHOUT CONTRAST TECHNIQUE: Multidetector CT imaging of the pelvis was performed following the standard protocol without intravenous contrast. COMPARISON:  Plain films 04/19/2018 and 11/05/2016 FINDINGS: Urinary Tract:  No abnormality visualized. Bowel: Moderate fecal retention over the rectum. Appendix is normal. Visualized small bowel is normal. Vascular/Lymphatic: Subtle calcified plaque at the aortic bifurcation. No adenopathy. Reproductive:  Previous hysterectomy. Other:  No free pelvic fluid. Musculoskeletal: Examination demonstrates left hip arthroplasty intact and normally located. There is a subacute to chronic fracture of the right inferior pubic ramus. There is a subtle very minimally displaced acute fracture of the anterior right acetabulum seen best on the coronal images. IMPRESSION: Subtle acute fracture of the anterior right acetabulum with  subtle displacement. Subacute to chronic right inferior pubic ramus fracture. Moderate fecal retention over the rectum. Electronically Signed   By:  Elberta Fortis M.D.   On: 04/19/2018 14:44   Dg Hip Unilat W Or Wo Pelvis 2-3 Views Right  Result Date: 04/19/2018 CLINICAL DATA:  Fall last night.  Right hip pain. Initial encounter. EXAM: DG HIP (WITH OR WITHOUT PELVIS) 2-3V RIGHT COMPARISON:  None. FINDINGS: There is no evidence of hip fracture or dislocation. A nondisplaced fracture of the right inferior pubic ramus shows mild sclerosis, consistent with a healing subacute fracture. IMPRESSION: No evidence of right hip fracture or dislocation. Subacute, healing fracture of the right inferior pubic ramus. Electronically Signed   By: Myles Rosenthal M.D.   On: 04/19/2018 13:19   Ct Maxillofacial Wo Contrast  Result Date: 04/19/2018 CLINICAL DATA:  Seizure activity, found on floor, headache and neck pain, chin laceration EXAM: CT HEAD WITHOUT CONTRAST CT MAXILLOFACIAL WITHOUT CONTRAST CT CERVICAL SPINE WITHOUT CONTRAST TECHNIQUE: Multidetector CT imaging of the head, cervical spine, and maxillofacial structures were performed using the standard protocol without intravenous contrast. Multiplanar CT image reconstructions of the cervical spine and maxillofacial structures were also generated. COMPARISON:  11/05/2016 FINDINGS: CT HEAD FINDINGS Brain: Stable atrophy, chronic white matter microvascular change, and basal ganglia lacunar-type infarcts bilaterally. No acute intracranial hemorrhage, new mass lesion, definite new infarction, midline shift, herniation, hydrocephalus, or extra-axial fluid collection. No focal mass effect or edema. Cisterns are patent. No cerebellar abnormality. Vascular: No hyperdense vessel or unexpected calcification. Skull: Normal. Negative for fracture or focal lesion. Other: None. CT MAXILLOFACIAL FINDINGS Osseous: No fracture or mandibular dislocation. No destructive process. Orbits:  Negative. No traumatic or inflammatory finding. Sinuses: Chronic maxillary retention cysts or polyps noted. Soft tissues: No significant soft tissue asymmetry, bruising, swelling, or hematoma. All teeth have been extracted. Dentures noted. CT CERVICAL SPINE FINDINGS Alignment: Normal. Skull base and vertebrae: No acute fracture. No primary bone lesion or focal pathologic process. Soft tissues and spinal canal: No prevertebral fluid or swelling. No visible canal hematoma. Disc levels: Mild degenerative spondylosis, most pronounced at C5-6 and C6-7 with disc space narrowing, sclerosis and endplate osteophytes. Preserved vertebral body heights. Facets are aligned. No subluxation or dislocation. No acute osseous finding or fracture. Upper chest: Negative. Other: None. IMPRESSION: Atrophy and chronic white matter microvascular changes. Remote basal ganglia lacunar type infarcts. No acute intracranial abnormality by noncontrast CT. No acute facial bony trauma or fracture. Chronic maxillary retention cysts or polyps. Cervical degenerative spondylosis without acute osseous finding or malalignment. Negative for fracture. Electronically Signed   By: Judie Petit.  Shick M.D.   On: 04/19/2018 13:40   DVT Prophylaxis -SCD  /Heparin AM Labs Ordered, also please review Full Orders  Family Communication: Admission, patients condition and plan of care including tests being ordered have been discussed with the patient  who indicate understanding and agree with the plan   Code Status - Full Code  Likely DC to  Home with HH Vs SNF   Condition   stable  Shon Hale M.D on 04/19/2018 at 5:17 PM Pager---[phone] Go to [email or web address] - [email or web address] for contact info  [facility] - Office  [phone]

## 2018-04-19 NOTE — ED Notes (Signed)
Receiving RN will call back when ready for report.  

## 2018-04-19 NOTE — ED Triage Notes (Signed)
Pt had seizure last night, woke up on floor. First seizure in over a year. Currently on keppra. C/o right side hip pain d/t fall. Laceration under chin.

## 2018-04-19 NOTE — Progress Notes (Signed)
ED TO INPATIENT HANDOFF REPORT  Name/Age/Gender Rose Bridges 57 y.o. female  Code Status    Code Status Orders  (From admission, onward)         Start     Ordered   04/19/18 1714  Full code  Continuous     04/19/18 1713        Code Status History    Date Active Date Inactive Code Status Order ID Comments User Context   11/05/2016 2331 11/09/2016 2048 Full Code [id number]  Hillary BowGardner, Jared M, DO ED   09/25/2015 1010 09/29/2015 1552 Full Code [id number]  Lora PaulaKrall, Jennifer T, MD ED      Home/SNF/Other Home  Chief Complaint Seizure  Level of Care/Admitting Diagnosis ED Disposition    ED Disposition Condition Comment   Admit  [facility] [[id number]]  Level of Care: Telemetry [5]  Admit to tele based on following criteria: Eval of Syncope  Diagnosis: Acetabular fracture (HCC) [343000]  Admitting Physician: Marylyn IshiharaEMOKPAE, COURAGE [[id number]]  Attending Physician: Marylyn IshiharaEMOKPAE, COURAGE [[id number]]  Bed request comments: tele  PT Class (Do Not Modify): Observation [104]  PT Acc Code (Do Not Modify): Observation [10022]       Medical History Past Medical History:  Diagnosis Date  . Hyperlipemia   . Hypertension   . Osteogenesis imperfecta   . Osteoporosis     Allergies Allergies  Allergen Reactions  . Codeine     "migraines"     IV Location/Drains/Wounds Patient Lines/Drains/Airways Status   Active Line/Drains/Airways    Name:   Placement date:   Placement time:   Site:   Days:   Peripheral IV 04/19/18 Left Hand   04/19/18    1038    Hand   less than 1          Labs/Imaging Results for orders placed or performed during the hospital encounter of 04/19/18 (from the past 48 hour(s))  Urinalysis, Routine w reflex microscopic     Status: Abnormal   Collection Time: 04/19/18 11:34 AM  Result Value Ref Range   Color, Urine YELLOW YELLOW   APPearance CLEAR CLEAR   Specific Gravity, Urine 1.015 1.005 - 1.030   pH 5.0 5.0 - 8.0   Glucose, UA  NEGATIVE NEGATIVE mg/dL   Hgb urine dipstick NEGATIVE NEGATIVE   Bilirubin Urine NEGATIVE NEGATIVE   Ketones, ur 5 (A) NEGATIVE mg/dL   Protein, ur NEGATIVE NEGATIVE mg/dL   Nitrite NEGATIVE NEGATIVE   Leukocytes, UA NEGATIVE NEGATIVE    Comment: Performed at [facility], [address] [place], [place] [address]  CBG monitoring, ED     Status: None   Collection Time: 04/19/18 11:54 AM  Result Value Ref Range   Glucose-Capillary 84 70 - 99 mg/dL  CBC WITH DIFFERENTIAL     Status: None (Preliminary result)   Collection Time: 04/19/18 12:30 PM  Result Value Ref Range   WBC SPECIMEN CLOTTED 4.0 - 10.5 K/uL   RBC SPECIMEN CLOTTED 3.87 - 5.11 MIL/uL   Hemoglobin SPECIMEN CLOTTED 12.0 - 15.0 g/dL   HCT SPECIMEN CLOTTED 36.0 - 46.0 %   MCV SPECIMEN CLOTTED 80.0 - 100.0 fL   MCH SPECIMEN CLOTTED 26.0 - 34.0 pg   MCHC SPECIMEN CLOTTED 30.0 - 36.0 g/dL   RDW SPECIMEN CLOTTED 11.5 - 15.5 %   Platelets SPECIMEN CLOTTED 150 - 400 K/uL   nRBC SPECIMEN CLOTTED 0.0 - 0.2 %   Neutrophils Relative % PENDING %   Neutro Abs PENDING  1.7 - 7.7 K/uL   Band Neutrophils PENDING %   Lymphocytes Relative PENDING %   Lymphs Abs PENDING 0.7 - 4.0 K/uL   Monocytes Relative PENDING %   Monocytes Absolute PENDING 0.1 - 1.0 K/uL   Eosinophils Relative PENDING %   Eosinophils Absolute PENDING 0.0 - 0.5 K/uL   Basophils Relative PENDING %   Basophils Absolute PENDING 0.0 - 0.1 K/uL   WBC Morphology PENDING    RBC Morphology PENDING    Smear Review PENDING    Other PENDING %   nRBC PENDING 0 /100 WBC   Metamyelocytes Relative PENDING %   Myelocytes PENDING %   Promyelocytes Relative PENDING %   Blasts PENDING %  Ethanol     Status: Abnormal   Collection Time: 04/19/18  1:00 PM  Result Value Ref Range   Alcohol, Ethyl (B) 31 (H) <10 mg/dL    Comment: (NOTE) Lowest detectable limit for serum alcohol is 10 mg/dL. For medical purposes only. Performed at [facility], [address] [place], [place] [address]   CBC with Differential/Platelet     Status: Abnormal   Collection Time: 04/19/18  1:00 PM  Result Value Ref Range   WBC 8.5 4.0 - 10.5 K/uL   RBC 5.13 (H) 3.87 - 5.11 MIL/uL   Hemoglobin 17.7 (H) 12.0 - 15.0 g/dL   HCT 60.4 (H) 54.0 - 98.1 %   MCV 106.2 (H) 80.0 - 100.0 fL   MCH 34.5 (H) 26.0 - 34.0 pg   MCHC 32.5 30.0 - 36.0 g/dL   RDW 19.1 47.8 - 29.5 %   Platelets 101 (L) 150 - 400 K/uL    Comment: Immature Platelet Fraction may be clinically indicated, consider ordering this additional test [id number]    nRBC 0.0 0.0 - 0.2 %   Neutrophils Relative % 71 %   Neutro Abs 6.0 1.7 - 7.7 K/uL   Lymphocytes Relative 22 %   Lymphs Abs 1.9 0.7 - 4.0 K/uL   Monocytes Relative 6 %   Monocytes Absolute 0.5 0.1 - 1.0 K/uL   Eosinophils Relative 1 %   Eosinophils Absolute 0.1 0.0 - 0.5 K/uL   Basophils Relative 0 %   Basophils Absolute 0.0 0.0 - 0.1 K/uL   Immature Granulocytes 0 %   Abs Immature Granulocytes 0.03 0.00 - 0.07 K/uL    Comment: Performed at [facility], [address] [place], [place] [address]  Comprehensive metabolic panel     Status: Abnormal   Collection Time: 04/19/18  1:00 PM  Result Value Ref Range   Sodium 145 135 - 145 mmol/L   Potassium 4.1 3.5 - 5.1 mmol/L   Chloride 107 98 - 111 mmol/L   CO2 21 (L) 22 - 32 mmol/L   Glucose, Bld 78 70 - 99 mg/dL   BUN 8 6 - 20 mg/dL   Creatinine, Ser 6.29 0.44 - 1.00 mg/dL   Calcium 9.4 8.9 - 52.8 mg/dL   Total Protein 7.6 6.5 - 8.1 g/dL   Albumin 4.4 3.5 - 5.0 g/dL   AST 32 15 - 41 U/L   ALT 20 0 - 44 U/L   Alkaline Phosphatase 92 38 - 126 U/L   Total Bilirubin 0.9 0.3 - 1.2 mg/dL   GFR calc non Af Amer >60 >60 mL/min   GFR calc Af Amer >60 >60 mL/min   Anion gap 17 (H) 5 - 15    Comment: Performed at [facility],  [address], [place], [place] [address]  Magnesium     Status: None   Collection Time: 04/19/18  1:00 PM   Result Value Ref Range   Magnesium 1.7 1.7 - 2.4 mg/dL    Comment: Performed at [facility], [address], [place], [place] [address]  I-Stat Chem 8, ED     Status: Abnormal   Collection Time: 04/19/18  5:33 PM  Result Value Ref Range   Sodium 143 135 - 145 mmol/L   Potassium 3.9 3.5 - 5.1 mmol/L   Chloride 107 98 - 111 mmol/L   BUN 6 6 - 20 mg/dL   Creatinine, Ser 0.98 0.44 - 1.00 mg/dL   Glucose, Bld 86 70 - 99 mg/dL   Calcium, Ion 1.19 (L) 1.15 - 1.40 mmol/L   TCO2 26 22 - 32 mmol/L   Hemoglobin 16.0 (H) 12.0 - 15.0 g/dL   HCT 14.7 (H) 82.9 - 56.2 %   Dg Chest 1 View  Result Date: 04/19/2018 CLINICAL DATA:  Seizure last night.  Seizure disorder. EXAM: CHEST  1 VIEW COMPARISON:  Seven hundred nineteen FINDINGS: The heart size and mediastinal contours are within normal limits. Both lungs are clear. Internal fixation plate and screws seen in the proximal right humerus. IMPRESSION: No active disease. Electronically Signed   By: Myles Rosenthal M.D.   On: 04/19/2018 13:20   Ct Head Wo Contrast  Result Date: 04/19/2018 CLINICAL DATA:  Seizure activity, found on floor, headache and neck pain, chin laceration EXAM: CT HEAD WITHOUT CONTRAST CT MAXILLOFACIAL WITHOUT CONTRAST CT CERVICAL SPINE WITHOUT CONTRAST TECHNIQUE: Multidetector CT imaging of the head, cervical spine, and maxillofacial structures were performed using the standard protocol without intravenous contrast. Multiplanar CT image reconstructions of the cervical spine and maxillofacial structures were also generated. COMPARISON:  11/05/2016 FINDINGS: CT HEAD FINDINGS Brain: Stable atrophy, chronic white matter microvascular change, and basal ganglia lacunar-type infarcts bilaterally. No acute intracranial hemorrhage, new mass lesion, definite new infarction, midline shift, herniation, hydrocephalus, or extra-axial fluid collection. No focal mass effect or edema. Cisterns are patent. No cerebellar abnormality.  Vascular: No hyperdense vessel or unexpected calcification. Skull: Normal. Negative for fracture or focal lesion. Other: None. CT MAXILLOFACIAL FINDINGS Osseous: No fracture or mandibular dislocation. No destructive process. Orbits: Negative. No traumatic or inflammatory finding. Sinuses: Chronic maxillary retention cysts or polyps noted. Soft tissues: No significant soft tissue asymmetry, bruising, swelling, or hematoma. All teeth have been extracted. Dentures noted. CT CERVICAL SPINE FINDINGS Alignment: Normal. Skull base and vertebrae: No acute fracture. No primary bone lesion or focal pathologic process. Soft tissues and spinal canal: No prevertebral fluid or swelling. No visible canal hematoma. Disc levels: Mild degenerative spondylosis, most pronounced at C5-6 and C6-7 with disc space narrowing, sclerosis and endplate osteophytes. Preserved vertebral body heights. Facets are aligned. No subluxation or dislocation. No acute osseous finding or fracture. Upper chest: Negative. Other: None. IMPRESSION: Atrophy and chronic white matter microvascular changes. Remote basal ganglia lacunar type infarcts. No acute intracranial abnormality by noncontrast CT. No acute facial bony trauma or fracture. Chronic maxillary retention cysts or polyps. Cervical degenerative spondylosis without acute osseous finding or malalignment. Negative for fracture. Electronically Signed   By: Judie Petit.  Shick M.D.   On: 04/19/2018 13:40   Ct Cervical Spine Wo Contrast  Result Date: 04/19/2018 CLINICAL DATA:  Seizure activity, found on floor, headache and neck pain, chin laceration EXAM: CT HEAD WITHOUT CONTRAST CT MAXILLOFACIAL WITHOUT CONTRAST CT CERVICAL SPINE WITHOUT CONTRAST TECHNIQUE:  Multidetector CT imaging of the head, cervical spine, and maxillofacial structures were performed using the standard protocol without intravenous contrast. Multiplanar CT image reconstructions of the cervical spine and maxillofacial structures were also  generated. COMPARISON:  11/05/2016 FINDINGS: CT HEAD FINDINGS Brain: Stable atrophy, chronic white matter microvascular change, and basal ganglia lacunar-type infarcts bilaterally. No acute intracranial hemorrhage, new mass lesion, definite new infarction, midline shift, herniation, hydrocephalus, or extra-axial fluid collection. No focal mass effect or edema. Cisterns are patent. No cerebellar abnormality. Vascular: No hyperdense vessel or unexpected calcification. Skull: Normal. Negative for fracture or focal lesion. Other: None. CT MAXILLOFACIAL FINDINGS Osseous: No fracture or mandibular dislocation. No destructive process. Orbits: Negative. No traumatic or inflammatory finding. Sinuses: Chronic maxillary retention cysts or polyps noted. Soft tissues: No significant soft tissue asymmetry, bruising, swelling, or hematoma. All teeth have been extracted. Dentures noted. CT CERVICAL SPINE FINDINGS Alignment: Normal. Skull base and vertebrae: No acute fracture. No primary bone lesion or focal pathologic process. Soft tissues and spinal canal: No prevertebral fluid or swelling. No visible canal hematoma. Disc levels: Mild degenerative spondylosis, most pronounced at C5-6 and C6-7 with disc space narrowing, sclerosis and endplate osteophytes. Preserved vertebral body heights. Facets are aligned. No subluxation or dislocation. No acute osseous finding or fracture. Upper chest: Negative. Other: None. IMPRESSION: Atrophy and chronic white matter microvascular changes. Remote basal ganglia lacunar type infarcts. No acute intracranial abnormality by noncontrast CT. No acute facial bony trauma or fracture. Chronic maxillary retention cysts or polyps. Cervical degenerative spondylosis without acute osseous finding or malalignment. Negative for fracture. Electronically Signed   By: Judie Petit.  Shick M.D.   On: 04/19/2018 13:40   Ct Pelvis Wo Contrast  Result Date: 04/19/2018 CLINICAL DATA:  Seizure last night with right hip pain.  EXAM: CT PELVIS WITHOUT CONTRAST TECHNIQUE: Multidetector CT imaging of the pelvis was performed following the standard protocol without intravenous contrast. COMPARISON:  Plain films 04/19/2018 and 11/05/2016 FINDINGS: Urinary Tract:  No abnormality visualized. Bowel: Moderate fecal retention over the rectum. Appendix is normal. Visualized small bowel is normal. Vascular/Lymphatic: Subtle calcified plaque at the aortic bifurcation. No adenopathy. Reproductive:  Previous hysterectomy. Other:  No free pelvic fluid. Musculoskeletal: Examination demonstrates left hip arthroplasty intact and normally located. There is a subacute to chronic fracture of the right inferior pubic ramus. There is a subtle very minimally displaced acute fracture of the anterior right acetabulum seen best on the coronal images. IMPRESSION: Subtle acute fracture of the anterior right acetabulum with subtle displacement. Subacute to chronic right inferior pubic ramus fracture. Moderate fecal retention over the rectum. Electronically Signed   By: Elberta Fortis M.D.   On: 04/19/2018 14:44   Dg Hip Unilat W Or Wo Pelvis 2-3 Views Right  Result Date: 04/19/2018 CLINICAL DATA:  Fall last night.  Right hip pain. Initial encounter. EXAM: DG HIP (WITH OR WITHOUT PELVIS) 2-3V RIGHT COMPARISON:  None. FINDINGS: There is no evidence of hip fracture or dislocation. A nondisplaced fracture of the right inferior pubic ramus shows mild sclerosis, consistent with a healing subacute fracture. IMPRESSION: No evidence of right hip fracture or dislocation. Subacute, healing fracture of the right inferior pubic ramus. Electronically Signed   By: Myles Rosenthal M.D.   On: 04/19/2018 13:19   Ct Maxillofacial Wo Contrast  Result Date: 04/19/2018 CLINICAL DATA:  Seizure activity, found on floor, headache and neck pain, chin laceration EXAM: CT HEAD WITHOUT CONTRAST CT MAXILLOFACIAL WITHOUT CONTRAST CT CERVICAL SPINE WITHOUT CONTRAST TECHNIQUE: Multidetector CT  imaging of the head, cervical spine, and maxillofacial structures were performed using the standard protocol without intravenous contrast. Multiplanar CT image reconstructions of the cervical spine and maxillofacial structures were also generated. COMPARISON:  11/05/2016 FINDINGS: CT HEAD FINDINGS Brain: Stable atrophy, chronic white matter microvascular change, and basal ganglia lacunar-type infarcts bilaterally. No acute intracranial hemorrhage, new mass lesion, definite new infarction, midline shift, herniation, hydrocephalus, or extra-axial fluid collection. No focal mass effect or edema. Cisterns are patent. No cerebellar abnormality. Vascular: No hyperdense vessel or unexpected calcification. Skull: Normal. Negative for fracture or focal lesion. Other: None. CT MAXILLOFACIAL FINDINGS Osseous: No fracture or mandibular dislocation. No destructive process. Orbits: Negative. No traumatic or inflammatory finding. Sinuses: Chronic maxillary retention cysts or polyps noted. Soft tissues: No significant soft tissue asymmetry, bruising, swelling, or hematoma. All teeth have been extracted. Dentures noted. CT CERVICAL SPINE FINDINGS Alignment: Normal. Skull base and vertebrae: No acute fracture. No primary bone lesion or focal pathologic process. Soft tissues and spinal canal: No prevertebral fluid or swelling. No visible canal hematoma. Disc levels: Mild degenerative spondylosis, most pronounced at C5-6 and C6-7 with disc space narrowing, sclerosis and endplate osteophytes. Preserved vertebral body heights. Facets are aligned. No subluxation or dislocation. No acute osseous finding or fracture. Upper chest: Negative. Other: None. IMPRESSION: Atrophy and chronic white matter microvascular changes. Remote basal ganglia lacunar type infarcts. No acute intracranial abnormality by noncontrast CT. No acute facial bony trauma or fracture. Chronic maxillary retention cysts or polyps. Cervical degenerative spondylosis without  acute osseous finding or malalignment. Negative for fracture. Electronically Signed   By: Judie PetitM.  Shick M.D.   On: 04/19/2018 13:40    Pending Labs Unresulted Labs (From admission, onward)    Start     Ordered   04/20/18 0500  Basic metabolic panel  Tomorrow morning,   R     04/19/18 1713   04/20/18 0500  CBC  Tomorrow morning,   R     04/19/18 1713   04/19/18 1714  HIV antibody (Routine Testing)  Once,   R     04/19/18 1713          Vitals/Pain Today's Vitals   04/19/18 1730 04/19/18 1731 04/19/18 1830 04/19/18 1900  BP: (!) 162/84 (!) 162/84 (!) 150/74 (!) 160/87  Pulse: 86 79 81 88  Resp: 19 13 17 20   Temp:      TempSrc:      SpO2: 96% 96% 96% 95%  PainSc:        Isolation Precautions No active isolations  Medications Medications  levETIRAcetam (KEPPRA) tablet 500 mg (has no administration in time range)  folic acid (FOLVITE) tablet 1 mg (has no administration in time range)  sodium chloride flush (NS) 0.9 % injection 3 mL (has no administration in time range)  sodium chloride flush (NS) 0.9 % injection 3 mL (has no administration in time range)  0.9 %  sodium chloride infusion (has no administration in time range)  acetaminophen (TYLENOL) tablet 650 mg (has no administration in time range)    Or  acetaminophen (TYLENOL) suppository 650 mg (has no administration in time range)  traZODone (DESYREL) tablet 50 mg (has no administration in time range)  ondansetron (ZOFRAN) tablet 4 mg (has no administration in time range)    Or  ondansetron (ZOFRAN) injection 4 mg (has no administration in time range)  albuterol (PROVENTIL) (2.5 MG/3ML) 0.083% nebulizer solution 2.5 mg (has no administration in time range)  heparin injection 5,000 Units (has no administration  in time range)  0.9 %  sodium chloride infusion (has no administration in time range)  multivitamin with minerals tablet 1 tablet (has no administration in time range)  thiamine (VITAMIN B-1) tablet 100 mg (has no  administration in time range)  LORazepam (ATIVAN) tablet 1 mg (has no administration in time range)    Or  LORazepam (ATIVAN) injection 1 mg (has no administration in time range)  thiamine (VITAMIN B-1) tablet 100 mg (has no administration in time range)    Or  thiamine (B-1) injection 100 mg (has no administration in time range)  diazepam (VALIUM) tablet 5 mg (has no administration in time range)  hydrALAZINE (APRESOLINE) injection 10 mg (has no administration in time range)  methocarbamol (ROBAXIN) tablet 500 mg (has no administration in time range)  oxyCODONE (Oxy IR/ROXICODONE) immediate release tablet 5 mg (has no administration in time range)  bisacodyl (DULCOLAX) suppository 10 mg (has no administration in time range)  polyethylene glycol (MIRALAX / GLYCOLAX) packet 17 g (has no administration in time range)  sodium chloride 0.9 % bolus 1,000 mL (0 mLs Intravenous Stopped 04/19/18 1330)  lidocaine-EPINEPHrine-tetracaine (LET) solution (3 mLs Topical Given 04/19/18 1229)  lidocaine-EPINEPHrine (XYLOCAINE W/EPI) 2 %-1:200000 (PF) injection 15 mL (15 mLs Infiltration Given 04/19/18 1229)  levETIRAcetam (KEPPRA) IVPB 1000 mg/100 mL premix (0 mg Intravenous Stopped 04/19/18 1332)  morphine 2 MG/ML injection 2 mg (2 mg Intravenous Given 04/19/18 1434)  Tdap (BOOSTRIX) injection 0.5 mL (0.5 mLs Intramuscular Given 04/19/18 1705)    Mobility walks

## 2018-04-19 NOTE — ED Provider Notes (Signed)
[facility]-EMERGENCY DEPT Provider Note   CSN: [id number] Arrival date & time: 04/19/18  1041     History   Chief Complaint Chief Complaint  Patient presents with  . Seizures  . Hip Pain    HPI Rose Bridges is a 57 y.o. female.  HPI  Patient is a 57 year old female with a history of hyperlipidemia, hypertension, seizures on Keppra, multiple falls resulting in polytrauma, and alcohol use presenting for possible seizure.  Patient reports that she is running out of her Keppra, and began rationing it. Patient reports that last night she had an episode of falling out of bed.  She does not recall this.  She states that this was a similar presentation last time she had a seizure.  She reports she sustained a laceration to her chin, and is also experiencing pain in her right hip.  She reports that any movement of the hip with flexion, extension.  Patient denies any recent fevers, chills, headaches, visual disturbance, chest pain, shortness breath, cough, abdominal pain, nausea, or vomiting.  She denies any recent dysuria, urgency, or frequency.  Past Medical History:  Diagnosis Date  . Hyperlipemia   . Hypertension   . Osteogenesis imperfecta   . Osteoporosis     Patient Active Problem List   Diagnosis Date Noted  . Seizure (HCC) 03/26/2017  . Confusion 12/19/2016  . Fall 12/19/2016  . Closed fracture of right proximal humerus 11/07/2016  . Syncope 11/05/2016  . Hip fracture (HCC) 09/25/2015  . Fall from slip, trip, or stumble   . Left displaced femoral neck fracture [facility](HCC)     Past Surgical History:  Procedure Laterality Date  . ABDOMINAL HYSTERECTOMY    . HIP ARTHROPLASTY Left 09/26/2015   Procedure: ARTHROPLASTY BIPOLAR HIP (HEMIARTHROPLASTY);  Surgeon: Sheral Apleyimothy D Murphy, MD;  [facility];  Service: Orthopedics;  Laterality: Left;  . ORIF HUMERUS FRACTURE Right 11/06/2016   Procedure: OPEN REDUCTION INTERNAL FIXATION (ORIF) PROXIMAL HUMERUS  FRACTURE;  Surgeon: Sheral ApleyMurphy, Timothy D, MD;  Location: [facility] OR;  Service: Orthopedics;  Laterality: Right;     OB History   No obstetric history on file.      Home Medications    Prior to Admission medications   Medication Sig Start Date End Date Taking? Authorizing Provider  levETIRAcetam (KEPPRA) 500 MG tablet Take 1 tablet (500 mg total) by mouth 2 (two) times daily. Patient taking differently: Take 500 mg by mouth daily.  11/13/17  Yes Garlon HatchetSanders, Lisa M, PA-C  folic acid (FOLVITE) 1 MG tablet Take 1 tablet (1 mg total) by mouth daily. Patient not taking: Reported on 11/12/2017 01/31/17   Levert FeinsteinYan, Yijun, MD    Family History Family History  Problem Relation Age of Onset  . Stroke Mother   . Heart attack Mother   . Hypertension Mother   . Stroke Sister   . Stroke Brother   . Lung cancer Father     Social History Social History   Tobacco Use  . Smoking status: Current Every Day Smoker    Packs/day: 1.00  . Smokeless tobacco: Never Used  Substance Use Topics  . Alcohol use: No    Alcohol/week: 85.0 standard drinks    Types: 85 Shots of liquor per week    Comment: No alcohol use since 11/04/16.  . Drug use: No     Allergies   Codeine   Review of Systems Review of Systems  Constitutional: Negative for chills and fever.  HENT: Negative for congestion,  rhinorrhea, sinus pain and sore throat.   Eyes: Negative for visual disturbance.  Respiratory: Negative for cough, chest tightness and shortness of breath.   Cardiovascular: Negative for chest pain, palpitations and leg swelling.  Gastrointestinal: Negative for abdominal pain, constipation, diarrhea, nausea and vomiting.  Genitourinary: Negative for dysuria and flank pain.  Musculoskeletal: Positive for arthralgias. Negative for back pain and myalgias.  Skin: Negative for rash.  Neurological: Positive for seizures. Negative for dizziness, syncope, light-headedness and headaches.     Physical Exam Updated Vital Signs BP  99/74   Pulse 89   Temp 97.8 F (36.6 C) (Oral)   Resp 14   SpO2 99%   Physical Exam Vitals signs and nursing note reviewed.  Constitutional:      General: She is not in acute distress.    Appearance: She is well-developed.  HENT:     Head: Normocephalic and atraumatic.  Eyes:     Conjunctiva/sclera: Conjunctivae normal.     Pupils: Pupils are equal, round, and reactive to light.  Neck:     Musculoskeletal: Normal range of motion and neck supple.  Cardiovascular:     Rate and Rhythm: Normal rate and regular rhythm.     Heart sounds: S1 normal and S2 normal. No murmur.  Pulmonary:     Effort: Pulmonary effort is normal.     Breath sounds: Normal breath sounds. No wheezing or rales.  Abdominal:     General: There is no distension.     Palpations: Abdomen is soft.     Tenderness: There is no abdominal tenderness. There is no guarding.  Musculoskeletal:     Comments: There is no erythema or edema noted over the right hip. Patient with tenderness palpation of her right anterior hip.  Patient resistant to flexion, extension, internal and external rotation.  Patient resistant to logrolling.    Lymphadenopathy:     Cervical: No cervical adenopathy.  Skin:    General: Skin is warm and dry.     Findings: No erythema or rash.     Comments: Patient has 2 cm laceration overlying the chin.  Slightly jagged. Muscle exposure noted. Full neurologic sensation around laceration.  Neurological:     Mental Status: She is alert.     Comments: Mental Status:  Alert, oriented, thought content appropriate, able to give a coherent history. Speech fluent without evidence of aphasia. Able to follow 2 step commands without difficulty.  Cranial Nerves:  II:  Peripheral visual fields grossly normal, pupils equal, round, reactive to light III,IV, VI: ptosis not present, extra-ocular motions intact bilaterally  V,VII: smile symmetric, facial light touch sensation equal VIII: hearing grossly normal to  voice  X: uvula elevates symmetrically  XI: bilateral shoulder shrug symmetric and strong XII: midline tongue extension without fassiculations Motor:  Normal tone. 5/5 in upper and lower extremities bilaterally including strong and equal grip strength and dorsiflexion/plantar flexion Sensory: Light touch normal in all extremities.  Stance: No pronator drift and good coordination, strength, and position sense with tapping of bilateral arms (performed in sitting position). CV: distal pulses palpable throughout    Psychiatric:        Behavior: Behavior normal.        Thought Content: Thought content normal.        Judgment: Judgment normal.      ED Treatments / Results  Labs (all labs ordered are listed, but only abnormal results are displayed) Labs Reviewed  URINALYSIS, ROUTINE W REFLEX MICROSCOPIC - Abnormal; Notable  for the following components:      Result Value   Ketones, ur 5 (*)    All other components within normal limits  CBC WITH DIFFERENTIAL/PLATELET  ETHANOL  CBC WITH DIFFERENTIAL/PLATELET  COMPREHENSIVE METABOLIC PANEL  MAGNESIUM  CBG MONITORING, ED    EKG None  Radiology No results found.  Procedures .Marland Kitchen.Laceration Repair Date/Time: 04/19/2018 5:00 PM Performed by: Elisha PonderMurray, Abigayle Wilinski B, PA-C Authorized by: Elisha PonderMurray, Ellery Tash B, PA-C   Consent:    Consent obtained:  Verbal   Consent given by:  Patient   Risks discussed:  Infection, need for additional repair, pain, poor cosmetic result and poor wound healing   Alternatives discussed:  No treatment and delayed treatment Universal protocol:    Procedure explained and questions answered to patient or proxy's satisfaction: yes     Relevant documents present and verified: yes     Test results available and properly labeled: yes     Imaging studies available: yes     Required blood products, implants, devices, and special equipment available: yes     Site/side marked: yes     Immediately prior to procedure, a time  out was called: yes     Patient identity confirmed:  Verbally with patient Anesthesia (see MAR for exact dosages):    Anesthesia method:  Local infiltration   Local anesthetic:  Lidocaine 2% WITH epi Laceration details:    Location:  Face   Face location:  Chin   Length (cm):  2 Repair type:    Repair type:  Simple Pre-procedure details:    Preparation:  Patient was prepped and draped in usual sterile fashion Exploration:    Hemostasis achieved with:  Direct pressure   Wound exploration: wound explored through full range of motion     Contaminated: no   Treatment:    Area cleansed with:  Betadine   Amount of cleaning:  Standard   Irrigation solution:  Sterile saline   Visualized foreign bodies/material removed: no   Skin repair:    Repair method:  Sutures   Suture size:  5-0   Suture material:  Fast-absorbing gut   Suture technique:  Simple interrupted   Number of sutures:  6 Approximation:    Approximation:  Close Post-procedure details:    Dressing:  Non-adherent dressing   Patient tolerance of procedure:  Tolerated well, no immediate complications   (including critical care time)  Medications Ordered in ED Medications  sodium chloride 0.9 % bolus 1,000 mL (1,000 mLs Intravenous New Bag/Given 04/19/18 1229)    And  0.9 %  sodium chloride infusion (has no administration in time range)  levETIRAcetam (KEPPRA) IVPB 1000 mg/100 mL premix (has no administration in time range)  lidocaine-EPINEPHrine-tetracaine (LET) solution (3 mLs Topical Given 04/19/18 1229)  lidocaine-EPINEPHrine (XYLOCAINE W/EPI) 2 %-1:200000 (PF) injection 15 mL (15 mLs Infiltration Given 04/19/18 1229)     Initial Impression / Assessment and Plan / ED Course  I have reviewed the triage vital signs and the nursing notes.  Pertinent labs & imaging results that were available during my care of the patient were reviewed by me and considered in my medical decision making (see chart for details).      Patient is nontoxic-appearing afebrile, and back to neurologic baseline on presentation.  Differential diagnosis includes trauma from falling out of bed versus seizure.  Given her history, considering seizures but may also be due to falling with alcohol use.  Patient has been rationing her Keppra, and will load her  with Keppra.  Patient is mainly complaining of pain in her right hip at this time.  Work-up significant for negative head, maxillofacial, and cervical spine.  Her radiograph of her right hip is not conclusive, and CT of the right hip demonstrates subacute fracture of right acetabulum.  Will consult reviewing orthopedics given patient's previous history of surgery with this group.  Patient has 2 cm laceration of the chin.  Will repaired primarily with sutures.   4:23 PM spoke with Dr. Thurston Hole of orthopedics who recommends hospital medicine admission with orthopedic consultation.  Recommends nonweightbearing at this time.  States that patient's injuries are nonsurgical at this time.  Appreciate his involvement in the care of this patient.   Patient admitted by Dr. Valerie Salts of [facility]. Appreciate his involvement in the care of this patient.   Final Clinical Impressions(s) / ED Diagnoses   Final diagnoses:  Loss of consciousness (HCC)  Fall from bed, initial encounter  Closed nondisplaced fracture of right acetabulum, unspecified portion of acetabulum, initial encounter [facility]    ED Discharge Orders    None       Delia Chimes 04/19/18 1704    Margarita Grizzle, MD 04/24/18 [phone]

## 2018-04-19 NOTE — ED Notes (Signed)
Bed: WA09 Expected date: 04/19/18 Expected time: 10:41 AM Means of arrival: Ambulance Comments: Post seizure last night

## 2018-04-19 NOTE — Consult Note (Signed)
Rose Bridges is an 57 y.o. female.   Chief Complaint:  Small minimally displaced right acetabulum fracture HPI: 12 yowf states she woke up today on the floor at the foot of her bed.  She believes she had another seizure and fell out of bed.  Once her mom's nurse helped her off the floor then she was able to ambulate to the kitchen with pain.    Past Medical History:  Diagnosis Date  . Hyperlipemia   . Hypertension   . Osteogenesis imperfecta   . Osteoporosis     Past Surgical History:  Procedure Laterality Date  . ABDOMINAL HYSTERECTOMY    . HIP ARTHROPLASTY Left 09/26/2015   Procedure: ARTHROPLASTY BIPOLAR HIP (HEMIARTHROPLASTY);  Surgeon: Sheral Apley, MD;  Location: [facility] OR;  Service: Orthopedics;  Laterality: Left;  . ORIF HUMERUS FRACTURE Right 11/06/2016   Procedure: OPEN REDUCTION INTERNAL FIXATION (ORIF) PROXIMAL HUMERUS FRACTURE;  Surgeon: Sheral Apley, MD;  Location: [facility] OR;  Service: Orthopedics;  Laterality: Right;    Family History  Problem Relation Age of Onset  . Stroke Mother   . Heart attack Mother   . Hypertension Mother   . Stroke Sister   . Stroke Brother   . Lung cancer Father    Social History:  reports that she has been smoking. She has been smoking about 1.00 pack per day. She has never used smokeless tobacco. She reports that she does not drink alcohol or use drugs.  Allergies:  Allergies  Allergen Reactions  . Codeine     "migraines"     (Not in a hospital admission)   Results for orders placed or performed during the hospital encounter of 04/19/18 (from the past 48 hour(s))  Urinalysis, Routine w reflex microscopic     Status: Abnormal   Collection Time: 04/19/18 11:34 AM  Result Value Ref Range   Color, Urine YELLOW YELLOW   APPearance CLEAR CLEAR   Specific Gravity, Urine 1.015 1.005 - 1.030   pH 5.0 5.0 - 8.0   Glucose, UA NEGATIVE NEGATIVE mg/dL   Hgb urine dipstick NEGATIVE NEGATIVE   Bilirubin Urine NEGATIVE NEGATIVE    Ketones, ur 5 (A) NEGATIVE mg/dL   Protein, ur NEGATIVE NEGATIVE mg/dL   Nitrite NEGATIVE NEGATIVE   Leukocytes, UA NEGATIVE NEGATIVE    Comment: Performed at [facility], [address] [place], [place] [address]  CBG monitoring, ED     Status: None   Collection Time: 04/19/18 11:54 AM  Result Value Ref Range   Glucose-Capillary 84 70 - 99 mg/dL  CBC WITH DIFFERENTIAL     Status: None (Preliminary result)   Collection Time: 04/19/18 12:30 PM  Result Value Ref Range   WBC SPECIMEN CLOTTED 4.0 - 10.5 K/uL   RBC SPECIMEN CLOTTED 3.87 - 5.11 MIL/uL   Hemoglobin SPECIMEN CLOTTED 12.0 - 15.0 g/dL   HCT SPECIMEN CLOTTED 36.0 - 46.0 %   MCV SPECIMEN CLOTTED 80.0 - 100.0 fL   MCH SPECIMEN CLOTTED 26.0 - 34.0 pg   MCHC SPECIMEN CLOTTED 30.0 - 36.0 g/dL   RDW SPECIMEN CLOTTED 11.5 - 15.5 %   Platelets SPECIMEN CLOTTED 150 - 400 K/uL   nRBC SPECIMEN CLOTTED 0.0 - 0.2 %   Neutrophils Relative % PENDING %   Neutro Abs PENDING 1.7 - 7.7 K/uL   Band Neutrophils PENDING %   Lymphocytes Relative PENDING %   Lymphs Abs PENDING 0.7 - 4.0 K/uL   Monocytes Relative PENDING %  Monocytes Absolute PENDING 0.1 - 1.0 K/uL   Eosinophils Relative PENDING %   Eosinophils Absolute PENDING 0.0 - 0.5 K/uL   Basophils Relative PENDING %   Basophils Absolute PENDING 0.0 - 0.1 K/uL   WBC Morphology PENDING    RBC Morphology PENDING    Smear Review PENDING    Other PENDING %   nRBC PENDING 0 /100 WBC   Metamyelocytes Relative PENDING %   Myelocytes PENDING %   Promyelocytes Relative PENDING %   Blasts PENDING %  Ethanol     Status: Abnormal   Collection Time: 04/19/18  1:00 PM  Result Value Ref Range   Alcohol, Ethyl (B) 31 (H) <10 mg/dL    Comment: (NOTE) Lowest detectable limit for serum alcohol is 10 mg/dL. For medical purposes only. Performed at [facility], [address] [place], [place] [address]   CBC with Differential/Platelet     Status: Abnormal    Collection Time: 04/19/18  1:00 PM  Result Value Ref Range   WBC 8.5 4.0 - 10.5 K/uL   RBC 5.13 (H) 3.87 - 5.11 MIL/uL   Hemoglobin 17.7 (H) 12.0 - 15.0 g/dL   HCT 60.454.5 (H) 54.036.0 - 98.146.0 %   MCV 106.2 (H) 80.0 - 100.0 fL   MCH 34.5 (H) 26.0 - 34.0 pg   MCHC 32.5 30.0 - 36.0 g/dL   RDW 19.111.8 47.811.5 - 29.515.5 %   Platelets 101 (L) 150 - 400 K/uL    Comment: Immature Platelet Fraction may be clinically indicated, consider ordering this additional test [id number]    nRBC 0.0 0.0 - 0.2 %   Neutrophils Relative % 71 %   Neutro Abs 6.0 1.7 - 7.7 K/uL   Lymphocytes Relative 22 %   Lymphs Abs 1.9 0.7 - 4.0 K/uL   Monocytes Relative 6 %   Monocytes Absolute 0.5 0.1 - 1.0 K/uL   Eosinophils Relative 1 %   Eosinophils Absolute 0.1 0.0 - 0.5 K/uL   Basophils Relative 0 %   Basophils Absolute 0.0 0.0 - 0.1 K/uL   Immature Granulocytes 0 %   Abs Immature Granulocytes 0.03 0.00 - 0.07 K/uL    Comment: Performed at [facility], [address] [place], [place] [address]   Dg Chest 1 View  Result Date: 04/19/2018 CLINICAL DATA:  Seizure last night.  Seizure disorder. EXAM: CHEST  1 VIEW COMPARISON:  Seven hundred nineteen FINDINGS: The heart size and mediastinal contours are within normal limits. Both lungs are clear. Internal fixation plate and screws seen in the proximal right humerus. IMPRESSION: No active disease. Electronically Signed   By: Myles RosenthalJohn  Stahl M.D.   On: 04/19/2018 13:20   Ct Head Wo Contrast  Result Date: 04/19/2018 CLINICAL DATA:  Seizure activity, found on floor, headache and neck pain, chin laceration EXAM: CT HEAD WITHOUT CONTRAST CT MAXILLOFACIAL WITHOUT CONTRAST CT CERVICAL SPINE WITHOUT CONTRAST TECHNIQUE: Multidetector CT imaging of the head, cervical spine, and maxillofacial structures were performed using the standard protocol without intravenous contrast. Multiplanar CT image reconstructions of the cervical spine and maxillofacial structures were also  generated. COMPARISON:  11/05/2016 FINDINGS: CT HEAD FINDINGS Brain: Stable atrophy, chronic white matter microvascular change, and basal ganglia lacunar-type infarcts bilaterally. No acute intracranial hemorrhage, new mass lesion, definite new infarction, midline shift, herniation, hydrocephalus, or extra-axial fluid collection. No focal mass effect or edema. Cisterns are patent. No cerebellar abnormality. Vascular: No hyperdense vessel or unexpected calcification. Skull: Normal. Negative for fracture or focal  lesion. Other: None. CT MAXILLOFACIAL FINDINGS Osseous: No fracture or mandibular dislocation. No destructive process. Orbits: Negative. No traumatic or inflammatory finding. Sinuses: Chronic maxillary retention cysts or polyps noted. Soft tissues: No significant soft tissue asymmetry, bruising, swelling, or hematoma. All teeth have been extracted. Dentures noted. CT CERVICAL SPINE FINDINGS Alignment: Normal. Skull base and vertebrae: No acute fracture. No primary bone lesion or focal pathologic process. Soft tissues and spinal canal: No prevertebral fluid or swelling. No visible canal hematoma. Disc levels: Mild degenerative spondylosis, most pronounced at C5-6 and C6-7 with disc space narrowing, sclerosis and endplate osteophytes. Preserved vertebral body heights. Facets are aligned. No subluxation or dislocation. No acute osseous finding or fracture. Upper chest: Negative. Other: None. IMPRESSION: Atrophy and chronic white matter microvascular changes. Remote basal ganglia lacunar type infarcts. No acute intracranial abnormality by noncontrast CT. No acute facial bony trauma or fracture. Chronic maxillary retention cysts or polyps. Cervical degenerative spondylosis without acute osseous finding or malalignment. Negative for fracture. Electronically Signed   By: Judie Petit.  Shick M.D.   On: 04/19/2018 13:40   Ct Cervical Spine Wo Contrast  Result Date: 04/19/2018 CLINICAL DATA:  Seizure activity, found on  floor, headache and neck pain, chin laceration EXAM: CT HEAD WITHOUT CONTRAST CT MAXILLOFACIAL WITHOUT CONTRAST CT CERVICAL SPINE WITHOUT CONTRAST TECHNIQUE: Multidetector CT imaging of the head, cervical spine, and maxillofacial structures were performed using the standard protocol without intravenous contrast. Multiplanar CT image reconstructions of the cervical spine and maxillofacial structures were also generated. COMPARISON:  11/05/2016 FINDINGS: CT HEAD FINDINGS Brain: Stable atrophy, chronic white matter microvascular change, and basal ganglia lacunar-type infarcts bilaterally. No acute intracranial hemorrhage, new mass lesion, definite new infarction, midline shift, herniation, hydrocephalus, or extra-axial fluid collection. No focal mass effect or edema. Cisterns are patent. No cerebellar abnormality. Vascular: No hyperdense vessel or unexpected calcification. Skull: Normal. Negative for fracture or focal lesion. Other: None. CT MAXILLOFACIAL FINDINGS Osseous: No fracture or mandibular dislocation. No destructive process. Orbits: Negative. No traumatic or inflammatory finding. Sinuses: Chronic maxillary retention cysts or polyps noted. Soft tissues: No significant soft tissue asymmetry, bruising, swelling, or hematoma. All teeth have been extracted. Dentures noted. CT CERVICAL SPINE FINDINGS Alignment: Normal. Skull base and vertebrae: No acute fracture. No primary bone lesion or focal pathologic process. Soft tissues and spinal canal: No prevertebral fluid or swelling. No visible canal hematoma. Disc levels: Mild degenerative spondylosis, most pronounced at C5-6 and C6-7 with disc space narrowing, sclerosis and endplate osteophytes. Preserved vertebral body heights. Facets are aligned. No subluxation or dislocation. No acute osseous finding or fracture. Upper chest: Negative. Other: None. IMPRESSION: Atrophy and chronic white matter microvascular changes. Remote basal ganglia lacunar type infarcts. No  acute intracranial abnormality by noncontrast CT. No acute facial bony trauma or fracture. Chronic maxillary retention cysts or polyps. Cervical degenerative spondylosis without acute osseous finding or malalignment. Negative for fracture. Electronically Signed   By: Judie Petit.  Shick M.D.   On: 04/19/2018 13:40   Ct Pelvis Wo Contrast  Result Date: 04/19/2018 CLINICAL DATA:  Seizure last night with right hip pain. EXAM: CT PELVIS WITHOUT CONTRAST TECHNIQUE: Multidetector CT imaging of the pelvis was performed following the standard protocol without intravenous contrast. COMPARISON:  Plain films 04/19/2018 and 11/05/2016 FINDINGS: Urinary Tract:  No abnormality visualized. Bowel: Moderate fecal retention over the rectum. Appendix is normal. Visualized small bowel is normal. Vascular/Lymphatic: Subtle calcified plaque at the aortic bifurcation. No adenopathy. Reproductive:  Previous hysterectomy. Other:  No free pelvic fluid. Musculoskeletal:  Examination demonstrates left hip arthroplasty intact and normally located. There is a subacute to chronic fracture of the right inferior pubic ramus. There is a subtle very minimally displaced acute fracture of the anterior right acetabulum seen best on the coronal images. IMPRESSION: Subtle acute fracture of the anterior right acetabulum with subtle displacement. Subacute to chronic right inferior pubic ramus fracture. Moderate fecal retention over the rectum. Electronically Signed   By: Elberta Fortisaniel  Boyle M.D.   On: 04/19/2018 14:44   Dg Hip Unilat W Or Wo Pelvis 2-3 Views Right  Result Date: 04/19/2018 CLINICAL DATA:  Fall last night.  Right hip pain. Initial encounter. EXAM: DG HIP (WITH OR WITHOUT PELVIS) 2-3V RIGHT COMPARISON:  None. FINDINGS: There is no evidence of hip fracture or dislocation. A nondisplaced fracture of the right inferior pubic ramus shows mild sclerosis, consistent with a healing subacute fracture. IMPRESSION: No evidence of right hip fracture or  dislocation. Subacute, healing fracture of the right inferior pubic ramus. Electronically Signed   By: Myles RosenthalJohn  Stahl M.D.   On: 04/19/2018 13:19   Ct Maxillofacial Wo Contrast  Result Date: 04/19/2018 CLINICAL DATA:  Seizure activity, found on floor, headache and neck pain, chin laceration EXAM: CT HEAD WITHOUT CONTRAST CT MAXILLOFACIAL WITHOUT CONTRAST CT CERVICAL SPINE WITHOUT CONTRAST TECHNIQUE: Multidetector CT imaging of the head, cervical spine, and maxillofacial structures were performed using the standard protocol without intravenous contrast. Multiplanar CT image reconstructions of the cervical spine and maxillofacial structures were also generated. COMPARISON:  11/05/2016 FINDINGS: CT HEAD FINDINGS Brain: Stable atrophy, chronic white matter microvascular change, and basal ganglia lacunar-type infarcts bilaterally. No acute intracranial hemorrhage, new mass lesion, definite new infarction, midline shift, herniation, hydrocephalus, or extra-axial fluid collection. No focal mass effect or edema. Cisterns are patent. No cerebellar abnormality. Vascular: No hyperdense vessel or unexpected calcification. Skull: Normal. Negative for fracture or focal lesion. Other: None. CT MAXILLOFACIAL FINDINGS Osseous: No fracture or mandibular dislocation. No destructive process. Orbits: Negative. No traumatic or inflammatory finding. Sinuses: Chronic maxillary retention cysts or polyps noted. Soft tissues: No significant soft tissue asymmetry, bruising, swelling, or hematoma. All teeth have been extracted. Dentures noted. CT CERVICAL SPINE FINDINGS Alignment: Normal. Skull base and vertebrae: No acute fracture. No primary bone lesion or focal pathologic process. Soft tissues and spinal canal: No prevertebral fluid or swelling. No visible canal hematoma. Disc levels: Mild degenerative spondylosis, most pronounced at C5-6 and C6-7 with disc space narrowing, sclerosis and endplate osteophytes. Preserved vertebral body  heights. Facets are aligned. No subluxation or dislocation. No acute osseous finding or fracture. Upper chest: Negative. Other: None. IMPRESSION: Atrophy and chronic white matter microvascular changes. Remote basal ganglia lacunar type infarcts. No acute intracranial abnormality by noncontrast CT. No acute facial bony trauma or fracture. Chronic maxillary retention cysts or polyps. Cervical degenerative spondylosis without acute osseous finding or malalignment. Negative for fracture. Electronically Signed   By: Judie PetitM.  Shick M.D.   On: 04/19/2018 13:40    Review of Systems  Constitutional: Negative.   HENT: Negative.   Eyes: Negative.   Respiratory: Negative.   Cardiovascular: Negative.   Gastrointestinal: Negative.   Genitourinary: Negative.   Musculoskeletal: Positive for falls and joint pain.  Skin: Negative.   Neurological: Positive for seizures.  Endo/Heme/Allergies: Bruises/bleeds easily.    Blood pressure (!) 152/74, pulse 78, temperature 97.8 F (36.6 C), temperature source Oral, resp. rate 13, SpO2 97 %. Physical Exam  Constitutional: She is oriented to person, place, and time. She appears well-developed and  well-nourished.  HENT:  Head: Normocephalic and atraumatic.  Mouth/Throat: Oropharynx is clear and moist.  Eyes: Pupils are equal, round, and reactive to light. Conjunctivae are normal.  Neck: Neck supple.  Cardiovascular: Normal rate.  Respiratory: Effort normal.  GI: Soft.  Genitourinary:    Genitourinary Comments: Not pertinent to current symptomatology therefore not examined.   Musculoskeletal:     Comments: Right hip  Mild pain with weight bearing.  DNVI  Neurological: She is alert and oriented to person, place, and time.  Skin: Skin is warm.     Assessment Principal Problem:   Acetabular fracture [facility]) Active Problems:   Seizure [facility])    Plan Patient is being admitted to medicine to evaluate for seizure vs cause for black out episode.  From an orthopedic  stand point, patient should be touch down weight bearing on her right hip.  Will order PT eval.

## 2018-04-20 ENCOUNTER — Observation Stay (HOSPITAL_BASED_OUTPATIENT_CLINIC_OR_DEPARTMENT_OTHER): Payer: BLUE CROSS/BLUE SHIELD

## 2018-04-20 DIAGNOSIS — R55 Syncope and collapse: Secondary | ICD-10-CM

## 2018-04-20 DIAGNOSIS — S32411A Displaced fracture of anterior wall of right acetabulum, initial encounter for closed fracture: Secondary | ICD-10-CM | POA: Diagnosis not present

## 2018-04-20 LAB — CBC
HCT: 43.7 % (ref 36.0–46.0)
Hemoglobin: 13.9 g/dL (ref 12.0–15.0)
MCH: 33.5 pg (ref 26.0–34.0)
MCHC: 31.8 g/dL (ref 30.0–36.0)
MCV: 105.3 fL — ABNORMAL HIGH (ref 80.0–100.0)
Platelets: 132 10*3/uL — ABNORMAL LOW (ref 150–400)
RBC: 4.15 MIL/uL (ref 3.87–5.11)
RDW: 11.9 % (ref 11.5–15.5)
WBC: 7.1 10*3/uL (ref 4.0–10.5)
nRBC: 0 % (ref 0.0–0.2)

## 2018-04-20 LAB — HIV ANTIBODY (ROUTINE TESTING W REFLEX): HIV Screen 4th Generation wRfx: NONREACTIVE

## 2018-04-20 LAB — BASIC METABOLIC PANEL
Anion gap: 12 (ref 5–15)
BUN: 11 mg/dL (ref 6–20)
CO2: 23 mmol/L (ref 22–32)
Calcium: 8.5 mg/dL — ABNORMAL LOW (ref 8.9–10.3)
Chloride: 107 mmol/L (ref 98–111)
Creatinine, Ser: 0.61 mg/dL (ref 0.44–1.00)
GFR calc non Af Amer: >60 mL/min (ref >60)
Glucose, Bld: 96 mg/dL (ref 70–99)
Potassium: 3.7 mmol/L (ref 3.5–5.1)
Sodium: 142 mmol/L (ref 135–145)

## 2018-04-20 MED ORDER — SENNOSIDES-DOCUSATE SODIUM 8.6-50 MG PO TABS
1.0000 | ORAL_TABLET | Freq: Two times a day (BID) | ORAL | Status: DC
  Administered 2018-04-20 – 2018-05-02 (×17): 1 via ORAL
  Filled 2018-04-20 (×19): qty 1

## 2018-04-20 MED ORDER — BISACODYL 5 MG PO TBEC: 5.0000 mg | DELAYED_RELEASE_TABLET | Freq: Every day | ORAL | Status: DC | PRN

## 2018-04-20 NOTE — Progress Notes (Signed)
PROGRESS NOTE    Rose Bridges  ZOX:[id number] DOB: [birth year] DOA: 04/19/2018 PCP: Knox Royalty, MD    Brief Narrative: 57 year old with past medical history significant for osteogenesis imperfecta, osteoporosis, Hypertension, ongoing alcohol abuse as well as seizures disorder noncompliance with Keppra who presents to the ED after falling at home. Patient has been drinking significant amount of alcohol, she fell and wake up on the floor with right hip pain and bleeding from her chin.  CT pelvis showed sublet acute fracture of the anterior right acetabulum with subtle displacement.  Subacute to chronic right inferior pubic ramus fracture.  Patient was evaluated by orthopedic who are recommending-down on the right side, no surgery indicated.  Assessment & Plan:   Principal Problem:   Acetabular fracture (HCC) Active Problems:   Seizure (HCC)  Recurrent fall suspect related to alcohol abuse. Carotid Doppler no significant stenosis. PT eval. Counseling provided regarding alcohol use.  Right anterior acetabular fracture, chronic right inferior pubic ramus fracture; Evaluated by orthopedic who recommended touch down weight bearing on the right leg. PT eval  History of seizure; continue with Keppra Received Keppra load in the ED.Marland Kitchen Hypertension; PRN hydralazine  Alcohol abuse; Continue with CIWA protocol  Constipation; Continue with MiraLAX, Senokot. suppository order  Dehydration With hemoconcentration. Improved.  Continue with IV fluids.  RN Pressure Injury Documentation:    Malnutrition Type:      Malnutrition Characteristics:      Nutrition Interventions:     Estimated body mass index is 22.47 kg/m as calculated from the following:   Height as of 11/12/17: 5\' 5"  (1.651 m).   Weight as of 03/26/17: 61.2 kg.   DVT prophylaxis: Heparin Code Status: full code.  Family Communication: Care discussed with patient Disposition Plan: P T  evaluation  Consultants:   Ortho   Procedures:  Carotid Doppler   Antimicrobials:  none   Subjective: She is complaining of pain, she has not had a bowel movement she was refusing MiraLAX.  Counseling provided regarding alcohol intake  Objective: Vitals:   04/19/18 2032 04/20/18 0450 04/20/18 1231 04/20/18 1321  BP: (!) 180/84 (!) 157/105 121/68 115/65  Pulse: 76 98 91 93  Resp: 18 18  16   Temp: 98.9 F (37.2 C) 99.3 F (37.4 C)  99.3 F (37.4 C)  TempSrc: Oral Oral  Oral  SpO2: 99% 94%  94%    Intake/Output Summary (Last 24 hours) at 04/20/2018 1449 Last data filed at 04/20/2018 1422 Gross per 24 hour  Intake 1693.38 ml  Output 834 ml  Net 859.38 ml   There were no vitals filed for this visit.  Examination:  General exam: Appears calm and comfortable, dressing chin, edema  Respiratory system: Clear to auscultation. Respiratory effort normal. Cardiovascular system: S1 & S2 heard, RRR. No JVD, murmurs, rubs, gallops or clicks. No pedal edema. Gastrointestinal system: Abdomen is nondistended, soft and nontender. No organomegaly or masses felt. Normal bowel sounds heard. Central nervous system: Alert and oriented.  Extremities: Symmetric 5 x 5 power. Skin: No rashes, lesions or ulcers     Data Reviewed: I have personally reviewed following labs and imaging studies  CBC: Recent Labs  Lab 04/19/18 1230 04/19/18 1300 04/19/18 1733 04/20/18 0500  WBC SPECIMEN CLOTTED 8.5  --  7.1  NEUTROABS PENDING 6.0  --   --   HGB SPECIMEN CLOTTED 17.7* 16.0* 13.9  HCT SPECIMEN CLOTTED 54.5* 47.0* 43.7  MCV SPECIMEN CLOTTED 106.2*  --  105.3*  PLT SPECIMEN CLOTTED 101*  --  132*   Basic Metabolic Panel: Recent Labs  Lab 04/19/18 1300 04/19/18 1733 04/20/18 0500  NA 145 143 142  K 4.1 3.9 3.7  CL 107 107 107  CO2 21*  --  23  GLUCOSE 78 86 96  BUN 8 6 11   CREATININE 0.56 0.50 0.61  CALCIUM 9.4  --  8.5*  MG 1.7  --   --    GFR: CrCl cannot be  calculated (Unknown ideal weight.). Liver Function Tests: Recent Labs  Lab 04/19/18 1300  AST 32  ALT 20  ALKPHOS 92  BILITOT 0.9  PROT 7.6  ALBUMIN 4.4   No results for input(s): LIPASE, AMYLASE in the last 168 hours. No results for input(s): AMMONIA in the last 168 hours. Coagulation Profile: No results for input(s): INR, PROTIME in the last 168 hours. Cardiac Enzymes: No results for input(s): CKTOTAL, CKMB, CKMBINDEX, TROPONINI in the last 168 hours. BNP (last 3 results) No results for input(s): PROBNP in the last 8760 hours. HbA1C: No results for input(s): HGBA1C in the last 72 hours. CBG: Recent Labs  Lab 04/19/18 1154  GLUCAP 84   Lipid Profile: No results for input(s): CHOL, HDL, LDLCALC, TRIG, CHOLHDL, LDLDIRECT in the last 72 hours. Thyroid Function Tests: No results for input(s): TSH, T4TOTAL, FREET4, T3FREE, THYROIDAB in the last 72 hours. Anemia Panel: No results for input(s): VITAMINB12, FOLATE, FERRITIN, TIBC, IRON, RETICCTPCT in the last 72 hours. Sepsis Labs: No results for input(s): PROCALCITON, LATICACIDVEN in the last 168 hours.  No results found for this or any previous visit (from the past 240 hour(s)).       Radiology Studies: Dg Chest 1 View  Result Date: 04/19/2018 CLINICAL DATA:  Seizure last night.  Seizure disorder. EXAM: CHEST  1 VIEW COMPARISON:  Seven hundred nineteen FINDINGS: The heart size and mediastinal contours are within normal limits. Both lungs are clear. Internal fixation plate and screws seen in the proximal right humerus. IMPRESSION: No active disease. Electronically Signed   By: Myles Rosenthal M.D.   On: 04/19/2018 13:20   Ct Head Wo Contrast  Result Date: 04/19/2018 CLINICAL DATA:  Seizure activity, found on floor, headache and neck pain, chin laceration EXAM: CT HEAD WITHOUT CONTRAST CT MAXILLOFACIAL WITHOUT CONTRAST CT CERVICAL SPINE WITHOUT CONTRAST TECHNIQUE: Multidetector CT imaging of the head, cervical spine, and  maxillofacial structures were performed using the standard protocol without intravenous contrast. Multiplanar CT image reconstructions of the cervical spine and maxillofacial structures were also generated. COMPARISON:  11/05/2016 FINDINGS: CT HEAD FINDINGS Brain: Stable atrophy, chronic white matter microvascular change, and basal ganglia lacunar-type infarcts bilaterally. No acute intracranial hemorrhage, new mass lesion, definite new infarction, midline shift, herniation, hydrocephalus, or extra-axial fluid collection. No focal mass effect or edema. Cisterns are patent. No cerebellar abnormality. Vascular: No hyperdense vessel or unexpected calcification. Skull: Normal. Negative for fracture or focal lesion. Other: None. CT MAXILLOFACIAL FINDINGS Osseous: No fracture or mandibular dislocation. No destructive process. Orbits: Negative. No traumatic or inflammatory finding. Sinuses: Chronic maxillary retention cysts or polyps noted. Soft tissues: No significant soft tissue asymmetry, bruising, swelling, or hematoma. All teeth have been extracted. Dentures noted. CT CERVICAL SPINE FINDINGS Alignment: Normal. Skull base and vertebrae: No acute fracture. No primary bone lesion or focal pathologic process. Soft tissues and spinal canal: No prevertebral fluid or swelling. No visible canal hematoma. Disc levels: Mild degenerative spondylosis, most pronounced at C5-6 and C6-7 with disc space narrowing, sclerosis and endplate osteophytes. Preserved vertebral body heights. Facets are  aligned. No subluxation or dislocation. No acute osseous finding or fracture. Upper chest: Negative. Other: None. IMPRESSION: Atrophy and chronic white matter microvascular changes. Remote basal ganglia lacunar type infarcts. No acute intracranial abnormality by noncontrast CT. No acute facial bony trauma or fracture. Chronic maxillary retention cysts or polyps. Cervical degenerative spondylosis without acute osseous finding or malalignment.  Negative for fracture. Electronically Signed   By: Judie Petit.  Shick M.D.   On: 04/19/2018 13:40   Ct Cervical Spine Wo Contrast  Result Date: 04/19/2018 CLINICAL DATA:  Seizure activity, found on floor, headache and neck pain, chin laceration EXAM: CT HEAD WITHOUT CONTRAST CT MAXILLOFACIAL WITHOUT CONTRAST CT CERVICAL SPINE WITHOUT CONTRAST TECHNIQUE: Multidetector CT imaging of the head, cervical spine, and maxillofacial structures were performed using the standard protocol without intravenous contrast. Multiplanar CT image reconstructions of the cervical spine and maxillofacial structures were also generated. COMPARISON:  11/05/2016 FINDINGS: CT HEAD FINDINGS Brain: Stable atrophy, chronic white matter microvascular change, and basal ganglia lacunar-type infarcts bilaterally. No acute intracranial hemorrhage, new mass lesion, definite new infarction, midline shift, herniation, hydrocephalus, or extra-axial fluid collection. No focal mass effect or edema. Cisterns are patent. No cerebellar abnormality. Vascular: No hyperdense vessel or unexpected calcification. Skull: Normal. Negative for fracture or focal lesion. Other: None. CT MAXILLOFACIAL FINDINGS Osseous: No fracture or mandibular dislocation. No destructive process. Orbits: Negative. No traumatic or inflammatory finding. Sinuses: Chronic maxillary retention cysts or polyps noted. Soft tissues: No significant soft tissue asymmetry, bruising, swelling, or hematoma. All teeth have been extracted. Dentures noted. CT CERVICAL SPINE FINDINGS Alignment: Normal. Skull base and vertebrae: No acute fracture. No primary bone lesion or focal pathologic process. Soft tissues and spinal canal: No prevertebral fluid or swelling. No visible canal hematoma. Disc levels: Mild degenerative spondylosis, most pronounced at C5-6 and C6-7 with disc space narrowing, sclerosis and endplate osteophytes. Preserved vertebral body heights. Facets are aligned. No subluxation or  dislocation. No acute osseous finding or fracture. Upper chest: Negative. Other: None. IMPRESSION: Atrophy and chronic white matter microvascular changes. Remote basal ganglia lacunar type infarcts. No acute intracranial abnormality by noncontrast CT. No acute facial bony trauma or fracture. Chronic maxillary retention cysts or polyps. Cervical degenerative spondylosis without acute osseous finding or malalignment. Negative for fracture. Electronically Signed   By: Judie Petit.  Shick M.D.   On: 04/19/2018 13:40   Ct Pelvis Wo Contrast  Result Date: 04/19/2018 CLINICAL DATA:  Seizure last night with right hip pain. EXAM: CT PELVIS WITHOUT CONTRAST TECHNIQUE: Multidetector CT imaging of the pelvis was performed following the standard protocol without intravenous contrast. COMPARISON:  Plain films 04/19/2018 and 11/05/2016 FINDINGS: Urinary Tract:  No abnormality visualized. Bowel: Moderate fecal retention over the rectum. Appendix is normal. Visualized small bowel is normal. Vascular/Lymphatic: Subtle calcified plaque at the aortic bifurcation. No adenopathy. Reproductive:  Previous hysterectomy. Other:  No free pelvic fluid. Musculoskeletal: Examination demonstrates left hip arthroplasty intact and normally located. There is a subacute to chronic fracture of the right inferior pubic ramus. There is a subtle very minimally displaced acute fracture of the anterior right acetabulum seen best on the coronal images. IMPRESSION: Subtle acute fracture of the anterior right acetabulum with subtle displacement. Subacute to chronic right inferior pubic ramus fracture. Moderate fecal retention over the rectum. Electronically Signed   By: Elberta Fortis M.D.   On: 04/19/2018 14:44   Dg Hip Unilat W Or Wo Pelvis 2-3 Views Right  Result Date: 04/19/2018 CLINICAL DATA:  Fall last night.  Right hip pain.  Initial encounter. EXAM: DG HIP (WITH OR WITHOUT PELVIS) 2-3V RIGHT COMPARISON:  None. FINDINGS: There is no evidence of hip  fracture or dislocation. A nondisplaced fracture of the right inferior pubic ramus shows mild sclerosis, consistent with a healing subacute fracture. IMPRESSION: No evidence of right hip fracture or dislocation. Subacute, healing fracture of the right inferior pubic ramus. Electronically Signed   By: Myles Rosenthal M.D.   On: 04/19/2018 13:19   Vas US Carotid  Result Date: 04/20/2018 Carotid Arterial Duplex Study Indications: Syncope. Performing Technologist: Chanda Busing RVT  Examination Guidelines: A complete evaluation includes B-mode imaging, spectral Doppler, color Doppler, and power Doppler as needed of all accessible portions of each vessel. Bilateral testing is considered an integral part of a complete examination. Limited examinations for reoccurring indications may be performed as noted.  Right Carotid Findings: +----------+--------+-------+--------+----------------------+------------------+           PSV cm/sEDV    StenosisDescribe              Comments                             cm/s                                                    +----------+--------+-------+--------+----------------------+------------------+ CCA Prox  111     17                                   intimal thickening +----------+--------+-------+--------+----------------------+------------------+ CCA Distal69      18                                                      +----------+--------+-------+--------+----------------------+------------------+ ICA Prox  66      19             smooth and                                                                heterogenous                             +----------+--------+-------+--------+----------------------+------------------+ ICA Distal57      17                                                      +----------+--------+-------+--------+----------------------+------------------+ ECA       99      14                                                       +----------+--------+-------+--------+----------------------+------------------+ +----------+--------+-------+--------+-------------------+  PSV cm/sEDV cmsDescribeArm Pressure (mmHG) +----------+--------+-------+--------+-------------------+ [id number]                                         +----------+--------+-------+--------+-------------------+ +---------+--------+--+--------+-+---------+ VertebralPSV cm/s48EDV cm/s9Antegrade +---------+--------+--+--------+-+---------+  Left Carotid Findings: +----------+--------+--------+--------+-----------------------+--------+           PSV cm/sEDV cm/sStenosisDescribe               Comments +----------+--------+--------+--------+-----------------------+--------+ CCA Prox  116     25                                              +----------+--------+--------+--------+-----------------------+--------+ CCA Distal74      22              smooth and heterogenous         +----------+--------+--------+--------+-----------------------+--------+ ICA Prox  49      14              smooth and heterogenous         +----------+--------+--------+--------+-----------------------+--------+ ICA Distal57      22                                              +----------+--------+--------+--------+-----------------------+--------+ ECA       96      16                                              +----------+--------+--------+--------+-----------------------+--------+ +----------+--------+--------+--------+-------------------+ SubclavianPSV cm/sEDV cm/sDescribeArm Pressure (mmHG) +----------+--------+--------+--------+-------------------+           109                                         +----------+--------+--------+--------+-------------------+ +---------+--------+--+--------+--+---------+ VertebralPSV cm/s45EDV cm/s13Antegrade +---------+--------+--+--------+--+---------+   Summary: Right Carotid: Velocities in the right ICA are consistent with a 1-39% stenosis. Left Carotid: Velocities in the left ICA are consistent with a 1-39% stenosis. Vertebrals: Bilateral vertebral arteries demonstrate antegrade flow. *See table(s) above for measurements and observations.  Electronically signed by Sherald Hesshristopher Clark MD on 04/20/2018 at 10:41:51 AM.    Final    Ct Maxillofacial Wo Contrast  Result Date: 04/19/2018 CLINICAL DATA:  Seizure activity, found on floor, headache and neck pain, chin laceration EXAM: CT HEAD WITHOUT CONTRAST CT MAXILLOFACIAL WITHOUT CONTRAST CT CERVICAL SPINE WITHOUT CONTRAST TECHNIQUE: Multidetector CT imaging of the head, cervical spine, and maxillofacial structures were performed using the standard protocol without intravenous contrast. Multiplanar CT image reconstructions of the cervical spine and maxillofacial structures were also generated. COMPARISON:  11/05/2016 FINDINGS: CT HEAD FINDINGS Brain: Stable atrophy, chronic white matter microvascular change, and basal ganglia lacunar-type infarcts bilaterally. No acute intracranial hemorrhage, new mass lesion, definite new infarction, midline shift, herniation, hydrocephalus, or extra-axial fluid collection. No focal mass effect or edema. Cisterns are patent. No cerebellar abnormality. Vascular: No hyperdense vessel or unexpected calcification. Skull: Normal. Negative for fracture or focal lesion. Other: None. CT MAXILLOFACIAL FINDINGS Osseous: No fracture or mandibular dislocation. No destructive  process. Orbits: Negative. No traumatic or inflammatory finding. Sinuses: Chronic maxillary retention cysts or polyps noted. Soft tissues: No significant soft tissue asymmetry, bruising, swelling, or hematoma. All teeth have been extracted. Dentures noted. CT CERVICAL SPINE FINDINGS Alignment: Normal. Skull base and vertebrae: No acute fracture. No primary bone lesion or focal pathologic process. Soft tissues and spinal  canal: No prevertebral fluid or swelling. No visible canal hematoma. Disc levels: Mild degenerative spondylosis, most pronounced at C5-6 and C6-7 with disc space narrowing, sclerosis and endplate osteophytes. Preserved vertebral body heights. Facets are aligned. No subluxation or dislocation. No acute osseous finding or fracture. Upper chest: Negative. Other: None. IMPRESSION: Atrophy and chronic white matter microvascular changes. Remote basal ganglia lacunar type infarcts. No acute intracranial abnormality by noncontrast CT. No acute facial bony trauma or fracture. Chronic maxillary retention cysts or polyps. Cervical degenerative spondylosis without acute osseous finding or malalignment. Negative for fracture. Electronically Signed   By: Judie PetitM.  Shick M.D.   On: 04/19/2018 13:40        Scheduled Meds: . bisacodyl  10 mg Rectal Once  . folic acid  1 mg Oral Daily  . heparin  5,000 Units Subcutaneous Q8H  . levETIRAcetam  500 mg Oral BID  . methocarbamol  500 mg Oral QID  . multivitamin with minerals  1 tablet Oral Daily  . polyethylene glycol  17 g Oral Daily  . sodium chloride flush  3 mL Intravenous Q12H  . thiamine  100 mg Oral Daily   Or  . thiamine  100 mg Intravenous Daily   Continuous Infusions: . sodium chloride    . sodium chloride 75 mL/hr at 04/20/18 1235     LOS: 0 days    Time spent: 35 minutes.,     Rose CoryBelkys A Adalis Gatti, MD [facility] Pager [phone]  If 7PM-7AM, please contact night-coverage [email or web address] Password [email or web address] 04/20/2018, 2:49 PM

## 2018-04-20 NOTE — Progress Notes (Signed)
Carotid artery duplex has been completed. Preliminary results can be found in CV Proc through chart review.   04/20/18 9:53 AM Olen CordialGreg Dashay Giesler RVT

## 2018-04-21 MED ORDER — LEVETIRACETAM 500 MG PO TABS: 500.0000 mg | ORAL_TABLET | Freq: Two times a day (BID) | ORAL | 0 refills | Status: DC

## 2018-04-21 MED ORDER — POLYETHYLENE GLYCOL 3350 17 G PO PACK: 17.0000 g | PACK | Freq: Every day | ORAL | 0 refills | Status: DC

## 2018-04-21 MED ORDER — OXYCODONE HCL 5 MG PO TABS: 5.0000 mg | ORAL_TABLET | ORAL | 0 refills | Status: DC | PRN

## 2018-04-21 MED ORDER — METOPROLOL TARTRATE 5 MG/5ML IV SOLN
5.0000 mg | Freq: Once | INTRAVENOUS | Status: AC
  Administered 2018-04-21: 5 mg via INTRAVENOUS
  Filled 2018-04-21: qty 5

## 2018-04-21 MED ORDER — THIAMINE HCL 100 MG PO TABS: 100.0000 mg | ORAL_TABLET | Freq: Every day | ORAL | 0 refills | Status: DC

## 2018-04-21 MED ORDER — DILTIAZEM HCL 25 MG/5ML IV SOLN
10.0000 mg | Freq: Once | INTRAVENOUS | Status: AC
  Administered 2018-04-21: 10 mg via INTRAVENOUS
  Filled 2018-04-21: qty 5

## 2018-04-21 MED ORDER — BISACODYL 5 MG PO TBEC: 5.0000 mg | DELAYED_RELEASE_TABLET | Freq: Every day | ORAL | 0 refills | Status: DC | PRN

## 2018-04-21 MED ORDER — METHOCARBAMOL 500 MG PO TABS: 500.0000 mg | ORAL_TABLET | Freq: Three times a day (TID) | ORAL | 0 refills | Status: DC | PRN

## 2018-04-21 MED ORDER — SODIUM CHLORIDE 0.9 % IV BOLUS
500.0000 mL | Freq: Once | INTRAVENOUS | Status: AC
  Administered 2018-04-23: 500 mL via INTRAVENOUS

## 2018-04-21 MED ORDER — ADULT MULTIVITAMIN W/MINERALS CH: 1.0000 | ORAL_TABLET | Freq: Every day | ORAL | 0 refills | Status: DC

## 2018-04-21 MED ORDER — METHOCARBAMOL 500 MG PO TABS
500.0000 mg | ORAL_TABLET | Freq: Three times a day (TID) | ORAL | Status: DC | PRN
  Administered 2018-04-26 – 2018-05-01 (×7): 500 mg via ORAL
  Filled 2018-04-21 (×7): qty 1

## 2018-04-21 MED ORDER — SENNOSIDES-DOCUSATE SODIUM 8.6-50 MG PO TABS: 1.0000 | ORAL_TABLET | Freq: Two times a day (BID) | ORAL | 0 refills | Status: DC

## 2018-04-21 NOTE — [place]2 (Signed)
[place] MEDICAID [place]2 LEVEL OF CARE SCREENING TOOL     IDENTIFICATION  Patient Name: Rose Bridges Birthdate: Jun 04, 1960 Sex: female Admission Date (Current Location): 04/19/2018  [facility] [place]   Facility and Address:  [facility],  [address], [place] [id number]      Provider Number: [id number]  Attending Physician Name and Address:  Alba Coryegalado, Belkys A, MD  Relative Name and Phone Number:       Current Level of Care: Hospital Recommended Level of Care: Skilled Nursing Facility Prior Approval Number:    Date Approved/Denied:   PASRR Number:    Discharge Plan: SNF    Current Diagnoses: Patient Active Problem List   Diagnosis Date Noted  . Acetabular fracture (HCC) 04/19/2018  . Seizure (HCC) 03/26/2017  . Confusion 12/19/2016  . Fall 12/19/2016  . Closed fracture of right proximal humerus 11/07/2016  . Syncope 11/05/2016  . Hip fracture (HCC) 09/25/2015  . Fall from slip, trip, or stumble   . Left displaced femoral neck fracture (HCC)     Orientation RESPIRATION BLADDER Height & Weight     Self, Time, Situation, Place  Normal Continent Weight:   Height:     BEHAVIORAL SYMPTOMS/MOOD NEUROLOGICAL BOWEL NUTRITION STATUS      Continent Diet(Heart Healthy )  AMBULATORY STATUS COMMUNICATION OF NEEDS Skin   Extensive Assist Verbally Normal                       Personal Care Assistance Level of Assistance  Bathing, Feeding, Dressing Bathing Assistance: Maximum assistance Feeding assistance: Independent Dressing Assistance: Maximum assistance     Functional Limitations Info  Sight, Hearing, Speech Sight Info: Adequate Hearing Info: Adequate Speech Info: Adequate    SPECIAL CARE FACTORS FREQUENCY  PT (By licensed PT), OT (By licensed OT)     PT Frequency: 5x/week OT Frequency: 5x/week            Contractures Contractures Info: Not present    Additional Factors Info  Code Status, Allergies,  Psychotropic, Insulin Sliding Scale Code Status Info: Fullcode   Psychotropic Info: Allergies: Codeine         Current Medications (04/21/2018):  This is the current hospital active medication list Current Facility-Administered Medications  Medication Dose Route Frequency Provider Last Rate Last Dose  . 0.9 %  sodium chloride infusion  250 mL Intravenous PRN Emokpae, Courage, MD      . 0.9 %  sodium chloride infusion   Intravenous Continuous Mariea ClontsEmokpae, Courage, MD 75 mL/hr at 04/21/18 1123    . acetaminophen (TYLENOL) tablet 650 mg  650 mg Oral Q6H PRN Shon HaleEmokpae, Courage, MD   650 mg at 04/21/18 [id number]   Or  . acetaminophen (TYLENOL) suppository 650 mg  650 mg Rectal Q6H PRN Emokpae, Courage, MD      . albuterol (PROVENTIL) (2.5 MG/3ML) 0.083% nebulizer solution 2.5 mg  2.5 mg Nebulization Q2H PRN Emokpae, Courage, MD      . bisacodyl (DULCOLAX) EC tablet 5 mg  5 mg Oral Daily PRN Regalado, Belkys A, MD      . folic acid (FOLVITE) tablet 1 mg  1 mg Oral Daily Emokpae, Courage, MD   1 mg at 04/21/18 1015  . heparin injection 5,000 Units  5,000 Units Subcutaneous Q8H Emokpae, Courage, MD   5,000 Units at 04/21/18 0512  . hydrALAZINE (APRESOLINE) injection 10 mg  10 mg Intravenous Q6H PRN Shon HaleEmokpae, Courage, MD      .  levETIRAcetam (KEPPRA) tablet 500 mg  500 mg Oral BID Mariea ClontsEmokpae, Courage, MD   500 mg at 04/21/18 1014  . LORazepam (ATIVAN) tablet 1 mg  1 mg Oral Q6H PRN Shon HaleEmokpae, Courage, MD       Or  . LORazepam (ATIVAN) injection 1 mg  1 mg Intravenous Q6H PRN Shon HaleEmokpae, Courage, MD   1 mg at 04/20/18 2343  . methocarbamol (ROBAXIN) tablet 500 mg  500 mg Oral Q8H PRN Regalado, Belkys A, MD      . multivitamin with minerals tablet 1 tablet  1 tablet Oral Daily Mariea ClontsEmokpae, Courage, MD   1 tablet at 04/21/18 1015  . ondansetron (ZOFRAN) tablet 4 mg  4 mg Oral Q6H PRN Emokpae, Courage, MD       Or  . ondansetron (ZOFRAN) injection 4 mg  4 mg Intravenous Q6H PRN Emokpae, Courage, MD      . oxyCODONE (Oxy  IR/ROXICODONE) immediate release tablet 5 mg  5 mg Oral Q4H PRN Mariea ClontsEmokpae, Courage, MD   5 mg at 04/21/18 1015  . polyethylene glycol (MIRALAX / GLYCOLAX) packet 17 g  17 g Oral Daily Emokpae, Courage, MD   17 g at 04/20/18 1230  . senna-docusate (Senokot-S) tablet 1 tablet  1 tablet Oral BID Regalado, Belkys A, MD   1 tablet at 04/20/18 2138  . sodium chloride 0.9 % bolus 500 mL  500 mL Intravenous Once [place] LargeBodenheimer, Charles A, NP 491.8 mL/hr at 04/21/18 [id number]    . sodium chloride flush (NS) 0.9 % injection 3 mL  3 mL Intravenous Q12H Emokpae, Courage, MD   3 mL at 04/21/18 1017  . sodium chloride flush (NS) 0.9 % injection 3 mL  3 mL Intravenous PRN Emokpae, Courage, MD      . thiamine (VITAMIN B-1) tablet 100 mg  100 mg Oral Daily Emokpae, Courage, MD   100 mg at 04/21/18 1015   Or  . thiamine (B-1) injection 100 mg  100 mg Intravenous Daily Emokpae, Courage, MD      . traZODone (DESYREL) tablet 50 mg  50 mg Oral QHS PRN Shon HaleEmokpae, Courage, MD   50 mg at 04/20/18 2138     Discharge Medications: Please see discharge summary for a list of discharge medications.  Relevant Imaging Results:  Relevant Lab Results:   Additional Information [id number]: [id number]  Clearance CootsNicole A Azucena Dart, LCSW

## 2018-04-21 NOTE — Discharge Summary (Signed)
Physician Discharge Summary  Rose Bridges ZOX:[id number] DOB: Sep 11, 1960 DOA: 04/19/2018  PCP: Knox Royalty, MD  Admit date: 04/19/2018 Discharge date: 04/21/2018  Admitted From: Home  Disposition:  SNF  Recommendations for Outpatient Follow-up:  1. Follow up with PCP in 1-2 weeks 2. Please obtain BMP/CBC in one week 3. Follow up with ortho in 2 weeks.    Discharge Condition: stable.  CODE STATUS: full code.  Diet recommendation: Heart Healthy   Brief/Interim Summary: Brief Narrative: 57 year old with past medical history significant for osteogenesis imperfecta, osteoporosis, Hypertension, ongoing alcohol abuse as well as seizures disorder noncompliance with Keppra who presents to the ED after falling at home. Patient has been drinking significant amount of alcohol, she fell and wake up on the floor with right hip pain and bleeding from her chin.  CT pelvis showed sublet acute fracture of the anterior right acetabulum with subtle displacement.  Subacute to chronic right inferior pubic ramus fracture.  Patient was evaluated by orthopedic who are recommending-down on the right side, no surgery indicated.  Assessment & Plan:   Principal Problem:   Acetabular fracture (HCC) Active Problems:   Seizure (HCC)  Recurrent fall suspect related to alcohol abuse. Carotid Doppler no significant stenosis. PT eval. Counseling provided regarding alcohol use.  Right anterior acetabular fracture, chronic right inferior pubic ramus fracture; Evaluated by orthopedic who recommended touch down weight bearing on the right leg. PT eval. Needs to go to SNF> SW consulted.  Will provide pain medications at discharge.   History of seizure; continue with Keppra Received Keppra load in the ED.Marland Kitchen Hypertension; PRN hydralazine  Alcohol abuse; Continue with CIWA protocol score at 0.   Constipation; Continue with MiraLAX, Senokot. suppository order had BM.   Dehydration With  hemoconcentration. Improved.  Continue with IV fluids.  Discharge Diagnoses:  Principal Problem:   Acetabular fracture [facility]) Active Problems:   Seizure [facility])    Discharge Instructions  Discharge Instructions    Diet - low sodium heart healthy   Complete by:  As directed    Increase activity slowly   Complete by:  As directed      Allergies as of 04/21/2018      Reactions   Codeine    "migraines"      Medication List    TAKE these medications   bisacodyl 5 MG EC tablet Commonly known as:  DULCOLAX Take 1 tablet (5 mg total) by mouth daily as needed for moderate constipation.   folic acid 1 MG tablet Commonly known as:  FOLVITE Take 1 tablet (1 mg total) by mouth daily.   levETIRAcetam 500 MG tablet Commonly known as:  KEPPRA Take 1 tablet (500 mg total) by mouth 2 (two) times daily. What changed:  when to take this   methocarbamol 500 MG tablet Commonly known as:  ROBAXIN Take 1 tablet (500 mg total) by mouth every 8 (eight) hours as needed for muscle spasms.   multivitamin with minerals Tabs tablet Take 1 tablet by mouth daily. Start taking on:  April 22, 2018   oxyCODONE 5 MG immediate release tablet Commonly known as:  Oxy IR/ROXICODONE Take 1 tablet (5 mg total) by mouth every 4 (four) hours as needed for moderate pain.   polyethylene glycol packet Commonly known as:  MIRALAX / GLYCOLAX Take 17 g by mouth daily. Start taking on:  April 22, 2018   senna-docusate 8.6-50 MG tablet Commonly known as:  Senokot-S Take 1 tablet by mouth 2 (two) times daily.   thiamine  100 MG tablet Take 1 tablet (100 mg total) by mouth daily. Start taking on:  April 22, 2018       Allergies  Allergen Reactions  . Codeine     "migraines"     Consultations:  Ortho.    Procedures/Studies: Dg Chest 1 View  Result Date: 04/19/2018 CLINICAL DATA:  Seizure last night.  Seizure disorder. EXAM: CHEST  1 VIEW COMPARISON:  Seven hundred nineteen  FINDINGS: The heart size and mediastinal contours are within normal limits. Both lungs are clear. Internal fixation plate and screws seen in the proximal right humerus. IMPRESSION: No active disease. Electronically Signed   By: Myles Rosenthal M.D.   On: 04/19/2018 13:20   Ct Head Wo Contrast  Result Date: 04/19/2018 CLINICAL DATA:  Seizure activity, found on floor, headache and neck pain, chin laceration EXAM: CT HEAD WITHOUT CONTRAST CT MAXILLOFACIAL WITHOUT CONTRAST CT CERVICAL SPINE WITHOUT CONTRAST TECHNIQUE: Multidetector CT imaging of the head, cervical spine, and maxillofacial structures were performed using the standard protocol without intravenous contrast. Multiplanar CT image reconstructions of the cervical spine and maxillofacial structures were also generated. COMPARISON:  11/05/2016 FINDINGS: CT HEAD FINDINGS Brain: Stable atrophy, chronic white matter microvascular change, and basal ganglia lacunar-type infarcts bilaterally. No acute intracranial hemorrhage, new mass lesion, definite new infarction, midline shift, herniation, hydrocephalus, or extra-axial fluid collection. No focal mass effect or edema. Cisterns are patent. No cerebellar abnormality. Vascular: No hyperdense vessel or unexpected calcification. Skull: Normal. Negative for fracture or focal lesion. Other: None. CT MAXILLOFACIAL FINDINGS Osseous: No fracture or mandibular dislocation. No destructive process. Orbits: Negative. No traumatic or inflammatory finding. Sinuses: Chronic maxillary retention cysts or polyps noted. Soft tissues: No significant soft tissue asymmetry, bruising, swelling, or hematoma. All teeth have been extracted. Dentures noted. CT CERVICAL SPINE FINDINGS Alignment: Normal. Skull base and vertebrae: No acute fracture. No primary bone lesion or focal pathologic process. Soft tissues and spinal canal: No prevertebral fluid or swelling. No visible canal hematoma. Disc levels: Mild degenerative spondylosis, most  pronounced at C5-6 and C6-7 with disc space narrowing, sclerosis and endplate osteophytes. Preserved vertebral body heights. Facets are aligned. No subluxation or dislocation. No acute osseous finding or fracture. Upper chest: Negative. Other: None. IMPRESSION: Atrophy and chronic white matter microvascular changes. Remote basal ganglia lacunar type infarcts. No acute intracranial abnormality by noncontrast CT. No acute facial bony trauma or fracture. Chronic maxillary retention cysts or polyps. Cervical degenerative spondylosis without acute osseous finding or malalignment. Negative for fracture. Electronically Signed   By: Judie Petit.  Shick M.D.   On: 04/19/2018 13:40   Ct Cervical Spine Wo Contrast  Result Date: 04/19/2018 CLINICAL DATA:  Seizure activity, found on floor, headache and neck pain, chin laceration EXAM: CT HEAD WITHOUT CONTRAST CT MAXILLOFACIAL WITHOUT CONTRAST CT CERVICAL SPINE WITHOUT CONTRAST TECHNIQUE: Multidetector CT imaging of the head, cervical spine, and maxillofacial structures were performed using the standard protocol without intravenous contrast. Multiplanar CT image reconstructions of the cervical spine and maxillofacial structures were also generated. COMPARISON:  11/05/2016 FINDINGS: CT HEAD FINDINGS Brain: Stable atrophy, chronic white matter microvascular change, and basal ganglia lacunar-type infarcts bilaterally. No acute intracranial hemorrhage, new mass lesion, definite new infarction, midline shift, herniation, hydrocephalus, or extra-axial fluid collection. No focal mass effect or edema. Cisterns are patent. No cerebellar abnormality. Vascular: No hyperdense vessel or unexpected calcification. Skull: Normal. Negative for fracture or focal lesion. Other: None. CT MAXILLOFACIAL FINDINGS Osseous: No fracture or mandibular dislocation. No destructive process. Orbits: Negative. No  traumatic or inflammatory finding. Sinuses: Chronic maxillary retention cysts or polyps noted. Soft  tissues: No significant soft tissue asymmetry, bruising, swelling, or hematoma. All teeth have been extracted. Dentures noted. CT CERVICAL SPINE FINDINGS Alignment: Normal. Skull base and vertebrae: No acute fracture. No primary bone lesion or focal pathologic process. Soft tissues and spinal canal: No prevertebral fluid or swelling. No visible canal hematoma. Disc levels: Mild degenerative spondylosis, most pronounced at C5-6 and C6-7 with disc space narrowing, sclerosis and endplate osteophytes. Preserved vertebral body heights. Facets are aligned. No subluxation or dislocation. No acute osseous finding or fracture. Upper chest: Negative. Other: None. IMPRESSION: Atrophy and chronic white matter microvascular changes. Remote basal ganglia lacunar type infarcts. No acute intracranial abnormality by noncontrast CT. No acute facial bony trauma or fracture. Chronic maxillary retention cysts or polyps. Cervical degenerative spondylosis without acute osseous finding or malalignment. Negative for fracture. Electronically Signed   By: Judie Petit.  Shick M.D.   On: 04/19/2018 13:40   Ct Pelvis Wo Contrast  Result Date: 04/19/2018 CLINICAL DATA:  Seizure last night with right hip pain. EXAM: CT PELVIS WITHOUT CONTRAST TECHNIQUE: Multidetector CT imaging of the pelvis was performed following the standard protocol without intravenous contrast. COMPARISON:  Plain films 04/19/2018 and 11/05/2016 FINDINGS: Urinary Tract:  No abnormality visualized. Bowel: Moderate fecal retention over the rectum. Appendix is normal. Visualized small bowel is normal. Vascular/Lymphatic: Subtle calcified plaque at the aortic bifurcation. No adenopathy. Reproductive:  Previous hysterectomy. Other:  No free pelvic fluid. Musculoskeletal: Examination demonstrates left hip arthroplasty intact and normally located. There is a subacute to chronic fracture of the right inferior pubic ramus. There is a subtle very minimally displaced acute fracture of the  anterior right acetabulum seen best on the coronal images. IMPRESSION: Subtle acute fracture of the anterior right acetabulum with subtle displacement. Subacute to chronic right inferior pubic ramus fracture. Moderate fecal retention over the rectum. Electronically Signed   By: Elberta Fortis M.D.   On: 04/19/2018 14:44   Dg Hip Unilat W Or Wo Pelvis 2-3 Views Right  Result Date: 04/19/2018 CLINICAL DATA:  Fall last night.  Right hip pain. Initial encounter. EXAM: DG HIP (WITH OR WITHOUT PELVIS) 2-3V RIGHT COMPARISON:  None. FINDINGS: There is no evidence of hip fracture or dislocation. A nondisplaced fracture of the right inferior pubic ramus shows mild sclerosis, consistent with a healing subacute fracture. IMPRESSION: No evidence of right hip fracture or dislocation. Subacute, healing fracture of the right inferior pubic ramus. Electronically Signed   By: Myles Rosenthal M.D.   On: 04/19/2018 13:19   Vas US Carotid  Result Date: 04/20/2018 Carotid Arterial Duplex Study Indications: Syncope. Performing Technologist: Chanda Busing RVT  Examination Guidelines: A complete evaluation includes B-mode imaging, spectral Doppler, color Doppler, and power Doppler as needed of all accessible portions of each vessel. Bilateral testing is considered an integral part of a complete examination. Limited examinations for reoccurring indications may be performed as noted.  Right Carotid Findings: +----------+--------+-------+--------+----------------------+------------------+           PSV cm/sEDV    StenosisDescribe              Comments                             cm/s                                                    +----------+--------+-------+--------+----------------------+------------------+  CCA Prox  111     17                                   intimal thickening +----------+--------+-------+--------+----------------------+------------------+ CCA Distal69      18                                                       +----------+--------+-------+--------+----------------------+------------------+ ICA Prox  66      19             smooth and                                                                heterogenous                             +----------+--------+-------+--------+----------------------+------------------+ ICA Distal57      17                                                      +----------+--------+-------+--------+----------------------+------------------+ ECA       99      14                                                      +----------+--------+-------+--------+----------------------+------------------+ +----------+--------+-------+--------+-------------------+           PSV cm/sEDV cmsDescribeArm Pressure (mmHG) +----------+--------+-------+--------+-------------------+ [id number]                                         +----------+--------+-------+--------+-------------------+ +---------+--------+--+--------+-+---------+ VertebralPSV cm/s48EDV cm/s9Antegrade +---------+--------+--+--------+-+---------+  Left Carotid Findings: +----------+--------+--------+--------+-----------------------+--------+           PSV cm/sEDV cm/sStenosisDescribe               Comments +----------+--------+--------+--------+-----------------------+--------+ CCA Prox  116     25                                              +----------+--------+--------+--------+-----------------------+--------+ CCA Distal74      22              smooth and heterogenous         +----------+--------+--------+--------+-----------------------+--------+ ICA Prox  49      14              smooth and heterogenous         +----------+--------+--------+--------+-----------------------+--------+ ICA Distal57      22                                              +----------+--------+--------+--------+-----------------------+--------+  ECA       96       16                                              +----------+--------+--------+--------+-----------------------+--------+ +----------+--------+--------+--------+-------------------+ SubclavianPSV cm/sEDV cm/sDescribeArm Pressure (mmHG) +----------+--------+--------+--------+-------------------+           109                                         +----------+--------+--------+--------+-------------------+ +---------+--------+--+--------+--+---------+ VertebralPSV cm/s45EDV cm/s13Antegrade +---------+--------+--+--------+--+---------+  Summary: Right Carotid: Velocities in the right ICA are consistent with a 1-39% stenosis. Left Carotid: Velocities in the left ICA are consistent with a 1-39% stenosis. Vertebrals: Bilateral vertebral arteries demonstrate antegrade flow. *See table(s) above for measurements and observations.  Electronically signed by Sherald Hesshristopher Clark MD on 04/20/2018 at 10:41:51 AM.    Final    Ct Maxillofacial Wo Contrast  Result Date: 04/19/2018 CLINICAL DATA:  Seizure activity, found on floor, headache and neck pain, chin laceration EXAM: CT HEAD WITHOUT CONTRAST CT MAXILLOFACIAL WITHOUT CONTRAST CT CERVICAL SPINE WITHOUT CONTRAST TECHNIQUE: Multidetector CT imaging of the head, cervical spine, and maxillofacial structures were performed using the standard protocol without intravenous contrast. Multiplanar CT image reconstructions of the cervical spine and maxillofacial structures were also generated. COMPARISON:  11/05/2016 FINDINGS: CT HEAD FINDINGS Brain: Stable atrophy, chronic white matter microvascular change, and basal ganglia lacunar-type infarcts bilaterally. No acute intracranial hemorrhage, new mass lesion, definite new infarction, midline shift, herniation, hydrocephalus, or extra-axial fluid collection. No focal mass effect or edema. Cisterns are patent. No cerebellar abnormality. Vascular: No hyperdense vessel or unexpected calcification. Skull:  Normal. Negative for fracture or focal lesion. Other: None. CT MAXILLOFACIAL FINDINGS Osseous: No fracture or mandibular dislocation. No destructive process. Orbits: Negative. No traumatic or inflammatory finding. Sinuses: Chronic maxillary retention cysts or polyps noted. Soft tissues: No significant soft tissue asymmetry, bruising, swelling, or hematoma. All teeth have been extracted. Dentures noted. CT CERVICAL SPINE FINDINGS Alignment: Normal. Skull base and vertebrae: No acute fracture. No primary bone lesion or focal pathologic process. Soft tissues and spinal canal: No prevertebral fluid or swelling. No visible canal hematoma. Disc levels: Mild degenerative spondylosis, most pronounced at C5-6 and C6-7 with disc space narrowing, sclerosis and endplate osteophytes. Preserved vertebral body heights. Facets are aligned. No subluxation or dislocation. No acute osseous finding or fracture. Upper chest: Negative. Other: None. IMPRESSION: Atrophy and chronic white matter microvascular changes. Remote basal ganglia lacunar type infarcts. No acute intracranial abnormality by noncontrast CT. No acute facial bony trauma or fracture. Chronic maxillary retention cysts or polyps. Cervical degenerative spondylosis without acute osseous finding or malalignment. Negative for fracture. Electronically Signed   By: Judie PetitM.  Shick M.D.   On: 04/19/2018 13:40    Subjective: Alert, complaining  of hip pain.   Discharge Exam: Vitals:   04/21/18 0332 04/21/18 0510  BP: (!) 151/86 132/87  Pulse: (!) 140 (!) 105  Resp: 15 16  Temp: 99.2 F (37.3 C) 97.8 F (36.6 C)  SpO2: 93% 95%   Vitals:   04/21/18 0024 04/21/18 0202 04/21/18 0332 04/21/18 0510  BP: 95/63 (!) 169/97 (!) 151/86 132/87  Pulse: 100 (!) 128 (!) 140 (!) 105  Resp:  16 15 16   Temp:  (!) 100.9 F (38.3  C) 99.2 F (37.3 C) 97.8 F (36.6 C)  TempSrc:  Oral Oral Oral  SpO2:  90% 93% 95%    General: Pt is alert, awake, not in acute distress, suture  chin.  Cardiovascular: RRR, S1/S2 +, no rubs, no gallops Respiratory: CTA bilaterally, no wheezing, no rhonchi Abdominal: Soft, NT, ND, bowel sounds + Extremities: no edema, no cyanosis    The results of significant diagnostics from this hospitalization (including imaging, microbiology, ancillary and laboratory) are listed below for reference.     Microbiology: No results found for this or any previous visit (from the past 240 hour(s)).   Labs: BNP (last 3 results) No results for input(s): BNP in the last 8760 hours. Basic Metabolic Panel: Recent Labs  Lab 04/19/18 1300 04/19/18 1733 04/20/18 0500  NA 145 143 142  K 4.1 3.9 3.7  CL 107 107 107  CO2 21*  --  23  GLUCOSE 78 86 96  BUN 8 6 11   CREATININE 0.56 0.50 0.61  CALCIUM 9.4  --  8.5*  MG 1.7  --   --    Liver Function Tests: Recent Labs  Lab 04/19/18 1300  AST 32  ALT 20  ALKPHOS 92  BILITOT 0.9  PROT 7.6  ALBUMIN 4.4   No results for input(s): LIPASE, AMYLASE in the last 168 hours. No results for input(s): AMMONIA in the last 168 hours. CBC: Recent Labs  Lab 04/19/18 1230 04/19/18 1300 04/19/18 1733 04/20/18 0500  WBC SPECIMEN CLOTTED 8.5  --  7.1  NEUTROABS PENDING 6.0  --   --   HGB SPECIMEN CLOTTED 17.7* 16.0* 13.9  HCT SPECIMEN CLOTTED 54.5* 47.0* 43.7  MCV SPECIMEN CLOTTED 106.2*  --  105.3*  PLT SPECIMEN CLOTTED 101*  --  132*   Cardiac Enzymes: No results for input(s): CKTOTAL, CKMB, CKMBINDEX, TROPONINI in the last 168 hours. BNP: Invalid input(s): POCBNP CBG: Recent Labs  Lab 04/19/18 1154  GLUCAP 84   D-Dimer No results for input(s): DDIMER in the last 72 hours. Hgb A1c No results for input(s): HGBA1C in the last 72 hours. Lipid Profile No results for input(s): CHOL, HDL, LDLCALC, TRIG, CHOLHDL, LDLDIRECT in the last 72 hours. Thyroid function studies No results for input(s): TSH, T4TOTAL, T3FREE, THYROIDAB in the last 72 hours.  Invalid input(s): FREET3 Anemia work  up No results for input(s): VITAMINB12, FOLATE, FERRITIN, TIBC, IRON, RETICCTPCT in the last 72 hours. Urinalysis    Component Value Date/Time   COLORURINE YELLOW 04/19/2018 1134   APPEARANCEUR CLEAR 04/19/2018 1134   LABSPEC 1.015 04/19/2018 1134   PHURINE 5.0 04/19/2018 1134   GLUCOSEU NEGATIVE 04/19/2018 1134   HGBUR NEGATIVE 04/19/2018 1134   BILIRUBINUR NEGATIVE 04/19/2018 1134   KETONESUR 5 (A) 04/19/2018 1134   PROTEINUR NEGATIVE 04/19/2018 1134   NITRITE NEGATIVE 04/19/2018 1134   LEUKOCYTESUR NEGATIVE 04/19/2018 1134   Sepsis Labs Invalid input(s): PROCALCITONIN,  WBC,  LACTICIDVEN Microbiology No results found for this or any previous visit (from the past 240 hour(s)).   Time coordinating discharge: 35 minutes,   SIGNED:   Alba CoryBelkys A Verley Pariseau, MD  [facility] 04/21/2018, 1:07 PM Pager   If 7PM-7AM, please contact night-coverage [email or web address] Password [email or web address]

## 2018-04-21 NOTE — Evaluation (Signed)
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  CT pelvis showed sublet acute fracture of the anterior right acetabulum with subtle displacement.  Subacute to chronic right inferior pubic ramus fracture  Clinical Impression  Pt admitted with above diagnosis. Pt currently with functional limitations due to the deficits listed below (see PT Problem List). Pt will benefit from skilled PT to increase their independence and safety with mobility to allow discharge to the venue listed below.  Pt required encouragement however agreeable to mobilize and assisted to/from bathroom (per pt request).  Pt does not adhere to TDWB status on R LE due to her report of weak and painful L hip (hx of L hip hemiarthroplasty 2017).  Pt would benefit from SNF upon d/c.     Follow Up Recommendations SNF;Supervision for mobility/OOB    Equipment Recommendations  Rolling walker with 5" wheels    Recommendations for Other Services       Precautions / Restrictions Precautions Precautions: Fall Restrictions Weight Bearing Restrictions: Yes RLE Weight Bearing: Touchdown weight bearing Other Position/Activity Restrictions: Dr. Sunnie Nielsenegalado said to follow ortho note of TDWB      Mobility  Bed Mobility Overal bed mobility: Needs Assistance Bed Mobility: Supine to Sit     Supine to sit: Mod assist     General bed mobility comments: assist for R LE over EOB and slight assist for trunk upright due to pain with forward flexion  Transfers Overall transfer level: Needs assistance Equipment used: Rolling walker (2 wheeled) Transfers: Sit to/from Stand Sit to  Stand: Min assist;From elevated surface;+2 safety/equipment         General transfer comment: verbal cues for UE and LE positioning, pt educated on TDWB status and performs PWB status despite max cues, assist to rise and steady as well as control descent  Ambulation/Gait Ambulation/Gait assistance: Min assist;+2 safety/equipment Gait Distance (Feet): 18 Feet Assistive device: Rolling walker (2 wheeled) Gait Pattern/deviations: Step-to pattern;Decreased stance time - right;Antalgic Gait velocity: decr   General Gait Details: verbal cues for TDWB however pt appears to be performing PWB despite cues, ambulated to/from bathroom, reports increased pain with ambulation  Stairs            Wheelchair Mobility    Modified Rankin (Stroke Patients Only)       Balance Overall balance assessment: History of Falls                                           Pertinent Vitals/Pain Pain Assessment: 0-10 Pain Score: 10-Worst pain ever Pain Location: right hip with transitional movement (reports none at rest) Pain Descriptors / Indicators: Grimacing;Discomfort;Sore Pain Intervention(s): Monitored during session;Repositioned    Home Living Family/patient expects to be discharged to:: Private residence Living Arrangements: Parent   Type of Home: Nurse, adultHouse     Entrance Stairs-Number of Steps: 3-4 Home Layout: Two level Home Equipment: Bedside commode Additional Comments: lives with her mother who is in her 9180s and unable to physically assist    Prior Function Level of Independence: Independent               Hand Dominance  Extremity/Trunk Assessment        Lower Extremity Assessment Lower Extremity Assessment: RLE deficits/detail;LLE deficits/detail RLE Deficits / Details: limited active hip movement due to pain LLE Deficits / Details: s/p hip hemi in 2017 and pt reports hip still with pain and likely weakness (states "bad" hip)        Communication   Communication: No difficulties  Cognition Arousal/Alertness: Awake/alert Behavior During Therapy: Flat affect Overall Cognitive Status: Within Functional Limits for tasks assessed                                        General Comments      Exercises     Assessment/Plan    PT Assessment Patient needs continued PT services  PT Problem List Decreased strength;Decreased mobility;Decreased balance;Decreased knowledge of use of DME;Pain;Decreased knowledge of precautions       PT Treatment Interventions DME instruction;Functional mobility training;Gait training;Therapeutic activities;Balance training;Therapeutic exercise;Wheelchair mobility training;Patient/family education    PT Goals (Current goals can be found in the Care Plan section)  Acute Rehab PT Goals PT Goal Formulation: With patient Time For Goal Achievement: 04/28/18 Potential to Achieve Goals: Good    Frequency Min 3X/week   Barriers to discharge        Co-evaluation               AM-PAC PT "6 Clicks" Mobility  Outcome Measure Help needed turning from your back to your side while in a flat bed without using bedrails?: A Little Help needed moving from lying on your back to sitting on the side of a flat bed without using bedrails?: A Little Help needed moving to and from a bed to a chair (including a wheelchair)?: A Little Help needed standing up from a chair using your arms (e.g., wheelchair or bedside chair)?: A Little Help needed to walk in hospital room?: A Little Help needed climbing 3-5 steps with a railing? : A Lot 6 Click Score: 17    End of Session Equipment Utilized During Treatment: Gait belt Activity Tolerance: Patient limited by pain Patient left: in chair;with chair alarm set;with call bell/phone within reach Nurse Communication: Mobility status PT Visit Diagnosis: Other abnormalities of gait and mobility (R26.89);Pain Pain - Right/Left: Right Pain -  part of body: Hip    Time: [id number] PT Time Calculation (min) (ACUTE ONLY): 41 min   Charges:   PT Evaluation $PT Eval Low Complexity: 1 Low PT Treatments $Gait Training: 8-22 mins      Zenovia JarredKati Jatavian Calica, PT, DPT Acute Rehabilitation Services Office: [phone] Pager: [phone]  Sarajane JewsLEMYRE,KATHrine E 04/21/2018, 12:36 PM

## 2018-04-21 NOTE — Clinical Social Work Note (Signed)
Clinical Social Work Assessment  Patient Details  Name: Rose Bridges MRN: [id number] Date of Birth: 01-Sep-1960  Date of referral:  04/21/18               Reason for consult:  Discharge Planning                Permission sought to share information with:  Facility Medical sales representativeContact Representative, Family Supports Permission granted to share information::     Name::        Agency::     Relationship::     Contact Information:     Housing/Transportation Living arrangements for the past 2 months:  Single Family Home Source of Information:  Patient Patient Interpreter Needed:  None Criminal Activity/Legal Involvement Pertinent to Current Situation/Hospitalization:  No - Comment as needed Significant Relationships:  Siblings Lives with:  Parents Do you feel safe going back to the place where you live?  Yes Need for family participation in patient care:  No (Coment)  Care giving concerns:   Patient has medical history significant for osteogenesis imperfecta, osteoporosis, Hypertension, ongoing alcohol abuse as well as seizures disorder noncompliance with Keppra who presents to the ED after falling at home. Patient has been drinking significant amount of alcohol,she fell and wake up on the floor with right hip pain and bleeding from herchin. CT pelvis showed sublet acute fracture of the anterior right acetabulum with subtle displacement. Subacute to chronic right inferior pubic ramus fracture. Patient was evaluated by orthopedic who are recommending-down on the right side, no surgery indicated.   Social Worker assessment / plan:  CSW discussed disposition planning with the patient at beside. The patient reports she is agreeable to SNF placement for short rehab. The patient reports she live at home with elderly mother with health issues and cannot assist with her care. Patient reports prior to the fall she was independent.  She reports she has been to SNF in past for rehab after falling and  sustaining a fracture . She reports she is concern that her insurance will not pay for placement because it ends on December 31. She reports she attempt to pay privately but is concern about running out of money. Patient reports she prefers a facility that will allow her to do a payment plan.  CSW explain SNF process and provided bed offers.   Employment status:    Insurance information:  Other (Comment Required)(BCBS) PT Recommendations:  Skilled Nursing Facility Information / Referral to community resources:  Skilled Nursing Facility  Patient/Family's Response to care:  Agreeable and Responding well to care.   Patient/Family's Understanding of and Emotional Response to Diagnosis, Current Treatment, and Prognosis:  Patient alert and oriented x4. She has good understanding of her diagnosis and follow up care.   Emotional Assessment Appearance:  Developmentally appropriate Attitude/Demeanor/Rapport:    Affect (typically observed):  Accepting Orientation:  Oriented to Self, Oriented to Place, Oriented to  Time, Oriented to Situation Alcohol / Substance use:  Alcohol Use Psych involvement (Current and /or in the community):  No (Comment)  Discharge Needs  Concerns to be addressed:  Discharge Planning Concerns Readmission within the last 30 days:  No Current discharge risk:  Dependent with Mobility Barriers to Discharge:  Continued Medical Work up   [employer] A Radha Coggins, LCSW 04/21/2018, 5:17 PM

## 2018-04-22 DIAGNOSIS — S32411D Displaced fracture of anterior wall of right acetabulum, subsequent encounter for fracture with routine healing: Secondary | ICD-10-CM | POA: Diagnosis not present

## 2018-04-22 DIAGNOSIS — R569 Unspecified convulsions: Secondary | ICD-10-CM | POA: Diagnosis not present

## 2018-04-22 NOTE — Progress Notes (Signed)
PROGRESS NOTE    Rose Bridges  UJW:[id number] DOB: 1960/12/27 DOA: 04/19/2018 PCP: Knox RoyaltyJones, Enrico, MD    Brief Narrative: 57 year old with past medical history significant for osteogenesis imperfecta, osteoporosis, Hypertension, ongoing alcohol abuse as well as seizures disorder noncompliance with Keppra who presents to the ED after falling at home. Patient has been drinking significant amount of alcohol, she fell and wake up on the floor with right hip pain and bleeding from her chin.  CT pelvis showed sublet acute fracture of the anterior right acetabulum with subtle displacement.  Subacute to chronic right inferior pubic ramus fracture.  Patient was evaluated by orthopedic who are recommending touch down on the right side, no surgery indicated.  Assessment & Plan:   Principal Problem:   Acetabular fracture (HCC) Active Problems:   Seizure (HCC)  Recurrent fall suspect related to alcohol abuse. Carotid Doppler no significant stenosis. PT eval. Counseling provided regarding alcohol use. 04/22/2018: Stable at this time  Right anterior acetabular fracture, chronic right inferior pubic ramus fracture; Evaluated by orthopedic who recommended touch down weight bearing on the right leg. PT eval  History of seizure; continue with Keppra Received Keppra load in the ED. Hypertension; PRN hydralazine 04/22/2018: Stable at this time  Alcohol abuse; Continue with CIWA protocol 04/22/2018: Does not appear to be in any alcohol withdrawal at this time.  Continue current management.  Constipation; Continue with MiraLAX, Senokot. suppository order  Dehydration With hemoconcentration. Improved with IV fluids.  RN Pressure Injury Documentation:    Malnutrition Type:      Malnutrition Characteristics:      Nutrition Interventions:     Estimated body mass index is 22.47 kg/m as calculated from the following:   Height as of 11/12/17: 5\' 5"  (1.651 m).   Weight as of  03/26/17: 61.2 kg.   DVT prophylaxis: Heparin subcutaneous Code Status: Full code.  Family Communication: Care discussed with patient Disposition Plan: Awaiting SNF  Consultants:   Ortho   Procedures:  Carotid Doppler   Antimicrobials:  none   Subjective: She is complaining of pain.  Denies having any other major complaints at this time.  Objective: Vitals:   04/21/18 1405 04/21/18 2340 04/22/18 0610 04/22/18 1313  BP: 108/70 130/70 (!) 148/81 137/81  Pulse: 91 86 80 79  Resp: 16 18 18 15   Temp: 97.7 F (36.5 C) 98.3 F (36.8 C) 98.4 F (36.9 C) 98.3 F (36.8 C)  TempSrc: Oral Oral Oral Oral  SpO2: 94% 94% 96% 96%    Intake/Output Summary (Last 24 hours) at 04/22/2018 1410 Last data filed at 04/22/2018 1407 Gross per 24 hour  Intake 4168.87 ml  Output 900 ml  Net 3268.87 ml   There were no vitals filed for this visit.  Examination:  General exam: Appears calm and comfortable, dressing chin, edema  Respiratory system: Clear to auscultation. Respiratory effort normal. Cardiovascular system: S1 & S2 heard, RRR. No JVD, murmurs, rubs, gallops or clicks. No pedal edema. Gastrointestinal system: Abdomen is nondistended, soft and nontender. No organomegaly or masses felt. Normal bowel sounds heard. Central nervous system: Alert and oriented.  Extremities: Symmetric 5 x 5 power. Skin: No rashes, lesions or ulcers     Data Reviewed: I have personally reviewed following labs and imaging studies  CBC: Recent Labs  Lab 04/19/18 1230 04/19/18 1300 04/19/18 1733 04/20/18 0500  WBC SPECIMEN CLOTTED 8.5  --  7.1  NEUTROABS PENDING 6.0  --   --   HGB SPECIMEN CLOTTED 17.7* 16.0* 13.9  HCT SPECIMEN CLOTTED 54.5* 47.0* 43.7  MCV SPECIMEN CLOTTED 106.2*  --  105.3*  PLT SPECIMEN CLOTTED 101*  --  132*   Basic Metabolic Panel: Recent Labs  Lab 04/19/18 1300 04/19/18 1733 04/20/18 0500  NA 145 143 142  K 4.1 3.9 3.7  CL 107 107 107  CO2 21*  --  23    GLUCOSE 78 86 96  BUN 8 6 11   CREATININE 0.56 0.50 0.61  CALCIUM 9.4  --  8.5*  MG 1.7  --   --    GFR: CrCl cannot be calculated (Unknown ideal weight.). Liver Function Tests: Recent Labs  Lab 04/19/18 1300  AST 32  ALT 20  ALKPHOS 92  BILITOT 0.9  PROT 7.6  ALBUMIN 4.4   No results for input(s): LIPASE, AMYLASE in the last 168 hours. No results for input(s): AMMONIA in the last 168 hours. Coagulation Profile: No results for input(s): INR, PROTIME in the last 168 hours. Cardiac Enzymes: No results for input(s): CKTOTAL, CKMB, CKMBINDEX, TROPONINI in the last 168 hours. BNP (last 3 results) No results for input(s): PROBNP in the last 8760 hours. HbA1C: No results for input(s): HGBA1C in the last 72 hours. CBG: Recent Labs  Lab 04/19/18 1154  GLUCAP 84   Lipid Profile: No results for input(s): CHOL, HDL, LDLCALC, TRIG, CHOLHDL, LDLDIRECT in the last 72 hours. Thyroid Function Tests: No results for input(s): TSH, T4TOTAL, FREET4, T3FREE, THYROIDAB in the last 72 hours. Anemia Panel: No results for input(s): VITAMINB12, FOLATE, FERRITIN, TIBC, IRON, RETICCTPCT in the last 72 hours. Sepsis Labs: No results for input(s): PROCALCITON, LATICACIDVEN in the last 168 hours.  No results found for this or any previous visit (from the past 240 hour(s)).       Radiology Studies: No results found.      Scheduled Meds: . folic acid  1 mg Oral Daily  . heparin  5,000 Units Subcutaneous Q8H  . levETIRAcetam  500 mg Oral BID  . multivitamin with minerals  1 tablet Oral Daily  . polyethylene glycol  17 g Oral Daily  . senna-docusate  1 tablet Oral BID  . sodium chloride flush  3 mL Intravenous Q12H  . thiamine  100 mg Oral Daily   Or  . thiamine  100 mg Intravenous Daily   Continuous Infusions: . sodium chloride    . sodium chloride 75 mL/hr (04/22/18 1407)  . sodium chloride 491.8 mL/hr at 04/21/18 0218     LOS: 0 days    Time spent: 20 minutes.      Vonzella NippleAnupama Danitza Schoenfeldt, MD [facility] Pager on amion  If 7PM-7AM, please contact night-coverage [email or web address] Password [email or web address] 04/22/2018, 2:10 PM

## 2018-04-22 NOTE — Progress Notes (Addendum)
Discussed SNF placement again with pt. Pt reporting again that her insurance coverage ends 04/29/18. 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 States she is considering applying for Medicaid but doubts she will qualify). Pt prefers to see if BCBS will approve coverage for remaining days of the year "then I can go home." Pt states she lives with her elderly mother who has a private caregiver.  Pt wants to go to [facility] however has an outstanding bill there and no bed offer made. Explained to pt- she states she is unsure where she wants to go and needs time to review bed offer list. Aware insurance closed for holidays- once selection made, facility can initiate authorization once companies open later this week.  CSW will follow up.  Ilean SkillMeghan Azile Minardi, MSW, LCSW Clinical Social Work 04/22/2018 [phone]

## 2018-04-23 DIAGNOSIS — S32411D Displaced fracture of anterior wall of right acetabulum, subsequent encounter for fracture with routine healing: Secondary | ICD-10-CM | POA: Diagnosis not present

## 2018-04-23 DIAGNOSIS — R4020 Unspecified coma: Secondary | ICD-10-CM | POA: Diagnosis not present

## 2018-04-23 DIAGNOSIS — R569 Unspecified convulsions: Secondary | ICD-10-CM | POA: Diagnosis not present

## 2018-04-23 NOTE — Progress Notes (Signed)
PROGRESS NOTE    Rose Bridges  [id number] DOB: [birth year] DOA: 04/19/2018 PCP: Knox RoyaltyJones, Enrico, MD  Brief Narrative: 57 year old with past medical history significant for osteogenesis imperfecta, osteoporosis, Hypertension, ongoing alcohol abuse as well as seizures disorder noncompliance with Keppra who presents to the ED after falling at home. Patient has been drinking significant amount of alcohol, she fell and wake up on the floor with right hip pain and bleeding from her chin.  CT pelvis showed sublet acute fracture of the anterior right acetabulum with subtle displacement.  Subacute to chronic right inferior pubic ramus fracture.  Patient was evaluated by orthopedic who are recommending touch down on the right side, no surgery indicated. Assessment & Plan:   Principal Problem:   Acetabular fracture (HCC) Active Problems:   Seizure (HCC)    Recurrent fall suspect related to alcohol abuse. Carotid Doppler no significant stenosis. Counseling provided regarding alcohol use. 04/22/2018: Stable at this time  Right anterior acetabular fracture, chronic right inferior pubic ramus fracture; Evaluated by orthopedic who recommended touch down weight bearing on the right leg. PT eval  History of seizure; continue with Keppra Received Keppra load in the ED. Hypertension; PRN hydralazine 04/22/2018: Stable at this time  Alcohol abuse; Continue with CIWA protocol 04/22/2018: Does not appear to be in any alcohol withdrawal at this time.  Continue current management.   Constipation; Continue with MiraLAX, Senokot. suppository order  Dehydration resolved with IV fluids.                Estimated body mass index is 22.47 kg/m as calculated from the following:   Height as of 11/12/17: 5\' 5"  (1.651 m).   Weight as of 03/26/17: 61.2 kg.  DVT prophylaxis: Subcu heparin Code Status: Full code Family Communication: None Disposition Plan: Pending SNF  placement Consultants: Ortho   Procedures: Carotid Doppler Antimicrobials: None Subjective: Patient resting in bed wondering why she got fluid bolus overnight.  Otherwise no complaints.  No nausea vomiting diarrhea abdominal pain chest pain shortness of breath or cough.  Objective: Vitals:   04/22/18 0610 04/22/18 1313 04/22/18 2133 04/23/18 0504  BP: (!) 148/81 137/81 (!) 148/86 (!) 145/94  Pulse: 80 79 74 87  Resp: 18 15 18 18   Temp: 98.4 F (36.9 C) 98.3 F (36.8 C) 97.7 F (36.5 C) 98.5 F (36.9 C)  TempSrc: Oral Oral Oral Oral  SpO2: 96% 96% 100% 96%    Intake/Output Summary (Last 24 hours) at 04/23/2018 1154 Last data filed at 04/23/2018 0910 Gross per 24 hour  Intake 3217.17 ml  Output 800 ml  Net 2417.17 ml   There were no vitals filed for this visit.  Examination:  General exam: Appears calm and comfortable  Respiratory system: Clear to auscultation. Respiratory effort normal. Cardiovascular system: S1 & S2 heard, RRR. No JVD, murmurs, rubs, gallops or clicks. No pedal edema. Gastrointestinal system: Abdomen is nondistended, soft and nontender. No organomegaly or masses felt. Normal bowel sounds heard. Central nervous system: Alert and oriented. No focal neurological deficits. Extremities: Symmetric 5 x 5 power. Skin: No rashes, lesions or ulcers Psychiatry: Judgement and insight appear normal. Mood & affect appropriate.     Data Reviewed: I have personally reviewed following labs and imaging studies  CBC: Recent Labs  Lab 04/19/18 1230 04/19/18 1300 04/19/18 1733 04/20/18 0500  WBC SPECIMEN CLOTTED 8.5  --  7.1  NEUTROABS PENDING 6.0  --   --   HGB SPECIMEN CLOTTED 17.7* 16.0* 13.9  HCT SPECIMEN CLOTTED 54.5*  47.0* 43.7  MCV SPECIMEN CLOTTED 106.2*  --  105.3*  PLT SPECIMEN CLOTTED 101*  --  132*   Basic Metabolic Panel: Recent Labs  Lab 04/19/18 1300 04/19/18 1733 04/20/18 0500  NA 145 143 142  K 4.1 3.9 3.7  CL 107 107 107  CO2 21*   --  23  GLUCOSE 78 86 96  BUN 8 6 11   CREATININE 0.56 0.50 0.61  CALCIUM 9.4  --  8.5*  MG 1.7  --   --    GFR: CrCl cannot be calculated (Unknown ideal weight.). Liver Function Tests: Recent Labs  Lab 04/19/18 1300  AST 32  ALT 20  ALKPHOS 92  BILITOT 0.9  PROT 7.6  ALBUMIN 4.4   No results for input(s): LIPASE, AMYLASE in the last 168 hours. No results for input(s): AMMONIA in the last 168 hours. Coagulation Profile: No results for input(s): INR, PROTIME in the last 168 hours. Cardiac Enzymes: No results for input(s): CKTOTAL, CKMB, CKMBINDEX, TROPONINI in the last 168 hours. BNP (last 3 results) No results for input(s): PROBNP in the last 8760 hours. HbA1C: No results for input(s): HGBA1C in the last 72 hours. CBG: Recent Labs  Lab 04/19/18 1154  GLUCAP 84   Lipid Profile: No results for input(s): CHOL, HDL, LDLCALC, TRIG, CHOLHDL, LDLDIRECT in the last 72 hours. Thyroid Function Tests: No results for input(s): TSH, T4TOTAL, FREET4, T3FREE, THYROIDAB in the last 72 hours. Anemia Panel: No results for input(s): VITAMINB12, FOLATE, FERRITIN, TIBC, IRON, RETICCTPCT in the last 72 hours. Sepsis Labs: No results for input(s): PROCALCITON, LATICACIDVEN in the last 168 hours.  No results found for this or any previous visit (from the past 240 hour(s)).       Radiology Studies: No results found.      Scheduled Meds: . folic acid  1 mg Oral Daily  . heparin  5,000 Units Subcutaneous Q8H  . levETIRAcetam  500 mg Oral BID  . multivitamin with minerals  1 tablet Oral Daily  . polyethylene glycol  17 g Oral Daily  . senna-docusate  1 tablet Oral BID  . sodium chloride flush  3 mL Intravenous Q12H  . thiamine  100 mg Oral Daily   Or  . thiamine  100 mg Intravenous Daily   Continuous Infusions: . sodium chloride       LOS: 0 days     Alwyn RenElizabeth G Ameriah Lint, MD  If 7PM-7AM, please contact night-coverage [email or web address] Password [email or web address] 04/23/2018, 11:54  AM

## 2018-04-23 NOTE — Progress Notes (Signed)
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  CT pelvis showed sublet acute fracture of the anterior right acetabulum with subtle displacement.  Subacute to chronic right inferior pubic ramus fracture    PT Comments    Pt with improved ambulation distance today. Pt stating she is trying to adhere to WB precautions, but appears to be more PWB than TDWB. Pt states her RLE functions better than LLE, even with the fracture. PT reinforced WB precautions throughout session and provided strategies to adhere to them. PT to continue to follow acutely, continuing to recommend SNF placement at this time.     Follow Up Recommendations  SNF;Supervision for mobility/OOB     Equipment Recommendations  Rolling walker with 5" wheels    Recommendations for Other Services       Precautions / Restrictions Precautions Precautions: Fall Restrictions Weight Bearing Restrictions: Yes RLE Weight Bearing: Touchdown weight bearing Other Position/Activity Restrictions: Pt reporting she has difficulty with RLE TDWB, stating "my right leg is better than my left". PT with frequent verbal cuing for TDWB.     Mobility  Bed Mobility Overal bed mobility: Needs Assistance             General bed mobility comments: Pt up in chair upon PT arrival to room, with urinary urgency. Pt soiled with urine.   Transfers Overall transfer level: Needs assistance Equipment used: Rolling walker (2 wheeled) Transfers: Sit to/from Stand Sit to Stand: Min assist         General transfer comment: Min assist for power up and steadying. PT reinforcing pt's TDWB  status, by having pt place RLE forward. Pt stating she cannot be only TDWB even with multimodal cues. [facility].   Ambulation/Gait Ambulation/Gait assistance: Min assist Gait Distance (Feet): 40 Feet Assistive device: Rolling walker (2 wheeled) Gait Pattern/deviations: Step-to pattern;Decreased stance time - right;Decreased weight shift to right;Antalgic Gait velocity: decr    General Gait Details: Min assist for steadying, reinforcing WB status during mobility. Pt still not appearing to be adhering to TDWB despite PT cues. PT with additional cues for sequencing, placement in RW. Pt with increased ambulation time and time turning.    Stairs             Wheelchair Mobility    Modified Rankin (Stroke Patients Only)       Balance Overall balance assessment: History of Falls;Needs assistance Sitting-balance support: No upper extremity supported Sitting balance-Leahy Scale: Good     Standing balance support: Bilateral upper extremity supported Standing balance-Leahy Scale: Poor Standing balance comment: Pt relies on PT and RW for steadying                             Cognition Arousal/Alertness: Awake/alert Behavior During Therapy: Flat affect Overall Cognitive Status: Within Functional Limits for tasks assessed                                        Exercises      General Comments  Pertinent Vitals/Pain Pain Assessment: 0-10 Pain Score: 8  Pain Location: R hip, with mobility  Pain Descriptors / Indicators: Sore Pain Intervention(s): Limited activity within patient's tolerance;Monitored during session    Home Living                      Prior Function            PT Goals (current goals can now be found in the care plan section) Acute Rehab PT Goals PT Goal Formulation: With patient Time For Goal Achievement: 04/28/18 Potential to Achieve Goals: Good Progress towards PT  goals: Progressing toward goals    Frequency    Min 3X/week      PT Plan Current plan remains appropriate    Co-evaluation              AM-PAC PT "6 Clicks" Mobility   Outcome Measure  Help needed turning from your back to your side while in a flat bed without using bedrails?: A Little Help needed moving from lying on your back to sitting on the side of a flat bed without using bedrails?: A Little Help needed moving to and from a bed to a chair (including a wheelchair)?: A Little Help needed standing up from a chair using your arms (e.g., wheelchair or bedside chair)?: A Little Help needed to walk in hospital room?: A Little Help needed climbing 3-5 steps with a railing? : A Lot 6 Click Score: 17    End of Session Equipment Utilized During Treatment: Gait belt Activity Tolerance: Patient limited by pain;Patient limited by fatigue Patient left: with call bell/phone within reach;in bed;with bed alarm set;with family/visitor present;with SCD's reapplied Nurse Communication: Mobility status(called NT and stated pt requesting purewick ) PT Visit Diagnosis: Other abnormalities of gait and mobility (R26.89);Pain Pain - Right/Left: Right Pain - part of body: Hip     Time: [id number] PT Time Calculation (min) (ACUTE ONLY): 17 min  Charges:  $Gait Training: 8-22 mins                     Rose PoliceAlexa D Demauri Bridges, PT Acute Rehabilitation Services Pager [phone]  Office [phone]   Tyrone AppleAlexa D Despina Hiddenure 04/23/2018, 5:38 PM

## 2018-04-24 DIAGNOSIS — R569 Unspecified convulsions: Secondary | ICD-10-CM | POA: Diagnosis not present

## 2018-04-24 DIAGNOSIS — S32401A Unspecified fracture of right acetabulum, initial encounter for closed fracture: Secondary | ICD-10-CM

## 2018-04-24 DIAGNOSIS — S32411D Displaced fracture of anterior wall of right acetabulum, subsequent encounter for fracture with routine healing: Secondary | ICD-10-CM | POA: Diagnosis not present

## 2018-04-24 MED ORDER — HYDRALAZINE HCL 25 MG PO TABS
25.0000 mg | ORAL_TABLET | Freq: Two times a day (BID) | ORAL | Status: DC
  Administered 2018-04-24 – 2018-05-02 (×17): 25 mg via ORAL
  Filled 2018-04-24 (×18): qty 1

## 2018-04-24 MED ORDER — AMLODIPINE BESYLATE 5 MG PO TABS: 5.0000 mg | ORAL_TABLET | Freq: Every day | ORAL | Status: DC

## 2018-04-24 NOTE — Progress Notes (Signed)
SNF bed offers made upon investigation have rescinded due to pt's BCBS insurance soon expiring (pt reports she is covered through 04/29/18). Pt plans to apply for medicaid, sister-in-law assisting with this however currently, should pt admit to SNF under approved BCBS coverage, pt would have no payor source as of 04/30/2018. CSW is investigating placement options given this complication- reaching out to facilities to inquire as to options.  Ilean SkillMeghan Azriella Mattia, MSW, LCSW Clinical Social Work 04/24/2018 [phone]

## 2018-04-24 NOTE — Progress Notes (Signed)
PROGRESS NOTE    Rose Bridges  [id number] DOB: 14-Oct-1960 DOA: 04/19/2018 PCP: Knox RoyaltyJones, Enrico, MD    Brief Narrative:57 year old with past medical history significant for osteogenesis imperfecta, osteoporosis, Hypertension, ongoing alcohol abuse as well as seizures disorder noncompliance with Keppra who presents to the ED after falling at home. Patient has been drinking significant amount of alcohol, she fell and wake up on the floor with right hip pain and bleeding from her chin.  CT pelvis showed sublet acute fracture of the anterior right acetabulum with subtle displacement. Subacute to chronic right inferior pubic ramus fracture. Patient was evaluated by orthopedic who are recommending touchdown on the right side, no surgery indicated.  Assessment & Plan:   Principal Problem:   Acetabular fracture (HCC) Active Problems:   Loss of consciousness (HCC)   Seizure (HCC)   Recurrent fall suspect related to alcohol abuse. Carotid Doppler no significant stenosis. Counseling provided regarding alcohol use.  Right anterior acetabular fracture, chronic right inferior pubic ramus fracture; Evaluated by orthopedic who recommended touch down weight bearing on the right leg.  History of seizure; continue with Keppra  Hypertension; blood pressure remains high start Norvasc 5 mg daily.  Constipation; Continue with MiraLAX, Senokot. suppository order  Dehydration resolved with IV fluids.    Estimated body mass index is 22.47 kg/m as calculated from the following:   Height as of 11/12/17: 5\' 5"  (1.651 m).   Weight as of 03/26/17: 61.2 kg.  DVT prophylaxis: Heparin Code Status: Full code Family Communication: None Disposition Plan pending placement   Consultants: Ortho   Procedures: None Antimicrobials: None  Subjective: Resting in bed no new complaints she gave me the number to her sister-in-law Consuella LoseHeather Chambers [phone] if we had any  questions.   Objective: Vitals:   04/23/18 0504 04/23/18 1829 04/23/18 2055 04/24/18 0457  BP: (!) 145/94 (!) 158/84 (!) 146/76 (!) 166/84  Pulse: 87 72 72 61  Resp: 18 16 18 19   Temp: 98.5 F (36.9 C) 98.3 F (36.8 C) 99 F (37.2 C) 98.1 F (36.7 C)  TempSrc: Oral Oral Oral Oral  SpO2: 96% 93% 91% 95%    Intake/Output Summary (Last 24 hours) at 04/24/2018 1222 Last data filed at 04/24/2018 1159 Gross per 24 hour  Intake 480 ml  Output 1700 ml  Net -1220 ml   There were no vitals filed for this visit.  Examination:  General exam: Appears calm and comfortable  Respiratory system: Clear to auscultation. Respiratory effort normal. Cardiovascular system: S1 & S2 heard, RRR. No JVD, murmurs, rubs, gallops or clicks. No pedal edema. Gastrointestinal system: Abdomen is nondistended, soft and nontender. No organomegaly or masses felt. Normal bowel sounds heard. Central nervous system: Alert and oriented. No focal neurological deficits. Extremities: Symmetric 5 x 5 power. Skin: No rashes, lesions or ulcers Psychiatry: Judgement and insight appear normal. Mood & affect appropriate.     Data Reviewed: I have personally reviewed following labs and imaging studies  CBC: Recent Labs  Lab 04/19/18 1230 04/19/18 1300 04/19/18 1733 04/20/18 0500  WBC SPECIMEN CLOTTED 8.5  --  7.1  NEUTROABS PENDING 6.0  --   --   HGB SPECIMEN CLOTTED 17.7* 16.0* 13.9  HCT SPECIMEN CLOTTED 54.5* 47.0* 43.7  MCV SPECIMEN CLOTTED 106.2*  --  105.3*  PLT SPECIMEN CLOTTED 101*  --  132*   Basic Metabolic Panel: Recent Labs  Lab 04/19/18 1300 04/19/18 1733 04/20/18 0500  NA 145 143 142  K 4.1 3.9 3.7  CL 107  107 107  CO2 21*  --  23  GLUCOSE 78 86 96  BUN 8 6 11   CREATININE 0.56 0.50 0.61  CALCIUM 9.4  --  8.5*  MG 1.7  --   --    GFR: CrCl cannot be calculated (Unknown ideal weight.). Liver Function Tests: Recent Labs  Lab 04/19/18 1300  AST 32  ALT 20  ALKPHOS 92  BILITOT  0.9  PROT 7.6  ALBUMIN 4.4   No results for input(s): LIPASE, AMYLASE in the last 168 hours. No results for input(s): AMMONIA in the last 168 hours. Coagulation Profile: No results for input(s): INR, PROTIME in the last 168 hours. Cardiac Enzymes: No results for input(s): CKTOTAL, CKMB, CKMBINDEX, TROPONINI in the last 168 hours. BNP (last 3 results) No results for input(s): PROBNP in the last 8760 hours. HbA1C: No results for input(s): HGBA1C in the last 72 hours. CBG: Recent Labs  Lab 04/19/18 1154  GLUCAP 84   Lipid Profile: No results for input(s): CHOL, HDL, LDLCALC, TRIG, CHOLHDL, LDLDIRECT in the last 72 hours. Thyroid Function Tests: No results for input(s): TSH, T4TOTAL, FREET4, T3FREE, THYROIDAB in the last 72 hours. Anemia Panel: No results for input(s): VITAMINB12, FOLATE, FERRITIN, TIBC, IRON, RETICCTPCT in the last 72 hours. Sepsis Labs: No results for input(s): PROCALCITON, LATICACIDVEN in the last 168 hours.  No results found for this or any previous visit (from the past 240 hour(s)).       Radiology Studies: No results found.      Scheduled Meds: . folic acid  1 mg Oral Daily  . heparin  5,000 Units Subcutaneous Q8H  . levETIRAcetam  500 mg Oral BID  . multivitamin with minerals  1 tablet Oral Daily  . polyethylene glycol  17 g Oral Daily  . senna-docusate  1 tablet Oral BID  . sodium chloride flush  3 mL Intravenous Q12H  . thiamine  100 mg Oral Daily   Or  . thiamine  100 mg Intravenous Daily   Continuous Infusions: . sodium chloride       LOS: 0 days     Alwyn RenElizabeth G Indie Boehne, MD [facility]   If 7PM-7AM, please contact night-coverage [email or web address] Password [email or web address] 04/24/2018, 12:22 PM

## 2018-04-25 DIAGNOSIS — R4020 Unspecified coma: Secondary | ICD-10-CM | POA: Diagnosis not present

## 2018-04-25 DIAGNOSIS — S32411D Displaced fracture of anterior wall of right acetabulum, subsequent encounter for fracture with routine healing: Secondary | ICD-10-CM | POA: Diagnosis not present

## 2018-04-25 NOTE — Progress Notes (Signed)
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  CT pelvis showed sublet acute fracture of the anterior right acetabulum with subtle displacement.  Subacute to chronic right inferior pubic ramus fracture    PT Comments    Pt very agreeable to mobilize and wished to improve distance this session. Pt continues to be nonadherent to TDWB and displays more PWB status.  Frequent encouragement and cues for TDWB per MD orders and pt reports she is aware and understands.   Follow Up Recommendations  SNF;Supervision for mobility/OOB     Equipment Recommendations  Rolling walker with 5" wheels    Recommendations for Other Services       Precautions / Restrictions Precautions Precautions: Fall Restrictions Weight Bearing Restrictions: Yes RLE Weight Bearing: Touchdown weight bearing Other Position/Activity Restrictions: Pt reporting she has difficulty with RLE TDWB, stating "my right leg is better than my left". PT with frequent verbal cuing for TDWB.     Mobility  Bed Mobility Overal bed mobility: Needs Assistance Bed Mobility: Supine to Sit     Supine to sit: Min guard;HOB elevated     General bed mobility comments: increased time and effort  Transfers Overall transfer level: Needs assistance Equipment used: Rolling walker (2 wheeled) Transfers: Sit to/from Stand Sit to Stand: Min assist         General transfer comment: Min assist for rise and steady. PT reinforcing pt's TDWB status however pt continues to state she cannot be only TDWB even with multimodal cues  Ambulation/Gait Ambulation/Gait assistance: Min  assist Gait Distance (Feet): 120 Feet Assistive device: Rolling walker (2 wheeled) Gait Pattern/deviations: Step-to pattern;Decreased stance time - right;Decreased weight shift to right;Antalgic Gait velocity: decr    General Gait Details: Min assist for steadying, reinforcing WB status during mobility. Pt still not appearing to be adhering to TDWB despite PT cues.  pt performed distance to her tolerance (wanted to continue ambulating improved distance today)   Stairs             Wheelchair Mobility    Modified Rankin (Stroke Patients Only)       Balance Overall balance assessment: History of Falls                                          Cognition Arousal/Alertness: Awake/alert Behavior During Therapy: Flat affect Overall Cognitive Status: Within Functional Limits for tasks assessed                                        Exercises      General Comments        Pertinent Vitals/Pain Pain Assessment: 0-10 Pain Score: 8  Pain Location: R hip, with mobility  Pain Descriptors / Indicators: Sore Pain Intervention(s): Monitored during session;Premedicated before session;Repositioned    Home Living                      Prior Function            PT Goals (current goals can now  be found in the care plan section) Progress towards PT goals: Progressing toward goals    Frequency    Min 3X/week      PT Plan Current plan remains appropriate    Co-evaluation              AM-PAC PT "6 Clicks" Mobility   Outcome Measure  Help needed turning from your back to your side while in a flat bed without using bedrails?: A Little Help needed moving from lying on your back to sitting on the side of a flat bed without using bedrails?: A Little Help needed moving to and from a bed to a chair (including a wheelchair)?: A Little Help needed standing up from a chair using your arms (e.g., wheelchair or bedside chair)?: A  Little Help needed to walk in hospital room?: A Little Help needed climbing 3-5 steps with a railing? : A Lot 6 Click Score: 17    End of Session Equipment Utilized During Treatment: Gait belt Activity Tolerance: Patient tolerated treatment well Patient left: in chair;with call bell/phone within reach;with chair alarm set Nurse Communication: Mobility status PT Visit Diagnosis: Other abnormalities of gait and mobility (R26.89);Pain Pain - Right/Left: Right Pain - part of body: Hip     Time: 1010-1039 PT Time Calculation (min) (ACUTE ONLY): 29 min  Charges:  $Gait Training: 23-37 mins                     Zenovia JarredKati Ryelle Ruvalcaba, PT, DPT Acute Rehabilitation Services Office: [phone] Pager: [phone]  Sarajane JewsLEMYRE,KATHrine E 04/25/2018, 12:39 PM

## 2018-04-25 NOTE — Progress Notes (Signed)
PROGRESS NOTE    Rose Bridges  [id number] DOB: June 06, 1960 DOA: 04/19/2018 PCP: Knox RoyaltyJones, Enrico, MD    Brief Narrative:57 year old with past medical history significant for osteogenesis imperfecta, osteoporosis, Hypertension, ongoing alcohol abuse as well as seizures disorder noncompliance with Keppra who presents to the ED after falling at home. Patient has been drinking significant amount of alcohol, she fell and wake up on the floor with right hip pain and bleeding from her chin.  CT pelvis showed sublet acute fracture of the anterior right acetabulum with subtle displacement. Subacute to chronic right inferior pubic ramus fracture. Patient was evaluated by orthopedic who are recommending touchdown on the right side, no surgery indicated. Assessment & Plan:   Principal Problem:   Acetabular fracture (HCC) Active Problems:   Loss of consciousness (HCC)   Seizure (HCC)   Recurrent fall suspect related to alcohol abuse. Carotid Doppler no significant stenosis. Counseling provided regarding alcohol use.  Right anterior acetabular fracture, chronic right inferior pubic ramus fracture; Evaluated by orthopedic who recommended touch down weight bearing on the right leg.  History of seizure; continue with Keppra  Hypertension; blood pressure remains high start Norvasc 5 mg daily.  Constipation; Continue with MiraLAX, Senokot. suppository order  Dehydrationresolved with IV fluids  Estimated body mass index is 22.47 kg/m as calculated from the following:   Height as of 11/12/17: 5\' 5"  (1.651 m).   Weight as of 03/26/17: 61.2 kg.  DVT prophylaxis:heparin Code Status: full Family Communication:none Disposition Plan: pending placement    Consultants:   ortho  Procedures: none  Antimicrobials: none   Subjective: No new events  Objective: Vitals:   04/24/18 1502 04/24/18 2154 04/25/18 0538 04/25/18 1252  BP: (!) 145/76 (!) 148/83 116/72 105/64  Pulse:  (!) 56 74 63 72  Resp: 20 16 16 18   Temp: 98.3 F (36.8 C) 98.6 F (37 C) 97.9 F (36.6 C) 98.1 F (36.7 C)  TempSrc: Oral Oral Oral Oral  SpO2: 95% 94% 94% 97%    Intake/Output Summary (Last 24 hours) at 04/25/2018 1408 Last data filed at 04/25/2018 1253 Gross per 24 hour  Intake 580 ml  Output 1675 ml  Net -1095 ml   There were no vitals filed for this visit.  Examination:  General exam: Appears calm and comfortable  Respiratory system: Clear to auscultation. Respiratory effort normal. Cardiovascular system: S1 & S2 heard, RRR. No JVD, murmurs, rubs, gallops or clicks. No pedal edema. Gastrointestinal system: Abdomen is nondistended, soft and nontender. No organomegaly or masses felt. Normal bowel sounds heard. Central nervous system: Alert and oriented. No focal neurological deficits. Extremities: Symmetric 5 x 5 power. Skin: No rashes, lesions or ulcers Psychiatry: Judgement and insight appear normal. Mood & affect appropriate.     Data Reviewed: I have personally reviewed following labs and imaging studies  CBC: Recent Labs  Lab 04/19/18 1230 04/19/18 1300 04/19/18 1733 04/20/18 0500  WBC SPECIMEN CLOTTED 8.5  --  7.1  NEUTROABS PENDING 6.0  --   --   HGB SPECIMEN CLOTTED 17.7* 16.0* 13.9  HCT SPECIMEN CLOTTED 54.5* 47.0* 43.7  MCV SPECIMEN CLOTTED 106.2*  --  105.3*  PLT SPECIMEN CLOTTED 101*  --  132*   Basic Metabolic Panel: Recent Labs  Lab 04/19/18 1300 04/19/18 1733 04/20/18 0500  NA 145 143 142  K 4.1 3.9 3.7  CL 107 107 107  CO2 21*  --  23  GLUCOSE 78 86 96  BUN 8 6 11   CREATININE 0.56 0.50 0.61  CALCIUM 9.4  --  8.5*  MG 1.7  --   --    GFR: CrCl cannot be calculated (Unknown ideal weight.). Liver Function Tests: Recent Labs  Lab 04/19/18 1300  AST 32  ALT 20  ALKPHOS 92  BILITOT 0.9  PROT 7.6  ALBUMIN 4.4   No results for input(s): LIPASE, AMYLASE in the last 168 hours. No results for input(s): AMMONIA in the last 168  hours. Coagulation Profile: No results for input(s): INR, PROTIME in the last 168 hours. Cardiac Enzymes: No results for input(s): CKTOTAL, CKMB, CKMBINDEX, TROPONINI in the last 168 hours. BNP (last 3 results) No results for input(s): PROBNP in the last 8760 hours. HbA1C: No results for input(s): HGBA1C in the last 72 hours. CBG: Recent Labs  Lab 04/19/18 1154  GLUCAP 84   Lipid Profile: No results for input(s): CHOL, HDL, LDLCALC, TRIG, CHOLHDL, LDLDIRECT in the last 72 hours. Thyroid Function Tests: No results for input(s): TSH, T4TOTAL, FREET4, T3FREE, THYROIDAB in the last 72 hours. Anemia Panel: No results for input(s): VITAMINB12, FOLATE, FERRITIN, TIBC, IRON, RETICCTPCT in the last 72 hours. Sepsis Labs: No results for input(s): PROCALCITON, LATICACIDVEN in the last 168 hours.  No results found for this or any previous visit (from the past 240 hour(s)).       Radiology Studies: No results found.      Scheduled Meds: . folic acid  1 mg Oral Daily  . heparin  5,000 Units Subcutaneous Q8H  . hydrALAZINE  25 mg Oral BID  . levETIRAcetam  500 mg Oral BID  . multivitamin with minerals  1 tablet Oral Daily  . polyethylene glycol  17 g Oral Daily  . senna-docusate  1 tablet Oral BID  . sodium chloride flush  3 mL Intravenous Q12H  . thiamine  100 mg Oral Daily   Or  . thiamine  100 mg Intravenous Daily   Continuous Infusions: . sodium chloride       LOS: 0 days     Alwyn RenElizabeth G Mathews, MD [facility] Pager [phone]  If 7PM-7AM, please contact night-coverage [email or web address] Password [email or web address] 04/25/2018, 2:08 PM

## 2018-04-26 ENCOUNTER — Encounter (HOSPITAL_COMMUNITY): Payer: Self-pay

## 2018-04-26 DIAGNOSIS — R569 Unspecified convulsions: Secondary | ICD-10-CM | POA: Diagnosis not present

## 2018-04-26 DIAGNOSIS — R4020 Unspecified coma: Secondary | ICD-10-CM | POA: Diagnosis not present

## 2018-04-26 DIAGNOSIS — S32411D Displaced fracture of anterior wall of right acetabulum, subsequent encounter for fracture with routine healing: Secondary | ICD-10-CM | POA: Diagnosis not present

## 2018-04-26 NOTE — Progress Notes (Signed)
PROGRESS NOTE    Rose Bridges  BJY:[id number] DOB: 25-Feb-1961 DOA: 04/19/2018 PCP: Knox RoyaltyJones, Enrico, MD  Brief Narrative:57 year old with past medical history significant for osteogenesis imperfecta, osteoporosis, Hypertension, ongoing alcohol abuse as well as seizures disorder noncompliance with Keppra who presents to the ED after falling at home. Patient has been drinking significant amount of alcohol, she fell and wake up on the floor with right hip pain and bleeding from her chin.  CT pelvis showed sublet acute fracture of the anterior right acetabulum with subtle displacement. Subacute to chronic right inferior pubic ramus fracture. Patient was evaluated by orthopedic who are recommending touchdown on the right side, no surgery indicated. Assessment & Plan:   Principal Problem:   Acetabular fracture (HCC) Active Problems:   Loss of consciousness (HCC)   Seizure (HCC)  Recurrent fall suspect related to alcohol abuse. Carotid Doppler no significant stenosis. Counseling provided regarding alcohol use.  Right anterior acetabular fracture, chronic right inferior pubic ramus fracture; Evaluated by orthopedic who recommended touch down weight bearing on the right leg.  History of seizure; continue with Keppra  Hypertension;blood pressure remains high start Norvasc 5 mg daily.  Constipation; Continue with MiraLAX, Senokot. suppository order  Dehydrationresolved with IV fluids    Estimated body mass index is 22.47 kg/m as calculated from the following:   Height as of 11/12/17: 5\' 5"  (1.651 m).   Weight as of 03/26/17: 61.2 kg.  DVT prophylaxis: Subcu heparin Code Status: Full code Family Communication: None Disposition Plan: Pending placement Consultants: Ortho none    Procedures:  Antimicrobials: None  Subjective: Resting in bed in no acute distress anxious to be discharged to a rehab facility.  But reports her insurance runs out by the end of the  year.   Objective: Vitals:   04/25/18 0538 04/25/18 1252 04/25/18 2105 04/26/18 0458  BP: 116/72 105/64 126/83 113/64  Pulse: 63 72 78 65  Resp: 16 18 18 14   Temp: 97.9 F (36.6 C) 98.1 F (36.7 C) 97.6 F (36.4 C) 99 F (37.2 C)  TempSrc: Oral Oral Oral Oral  SpO2: 94% 97% 96% 90%    Intake/Output Summary (Last 24 hours) at 04/26/2018 1237 Last data filed at 04/26/2018 0500 Gross per 24 hour  Intake 240 ml  Output 1000 ml  Net -760 ml   There were no vitals filed for this visit.  Examination:  General exam: Appears calm and comfortable  Respiratory system: Clear to auscultation. Respiratory effort normal. Cardiovascular system: S1 & S2 heard, RRR. No JVD, murmurs, rubs, gallops or clicks. No pedal edema. Gastrointestinal system: Abdomen is nondistended, soft and nontender. No organomegaly or masses felt. Normal bowel sounds heard. Central nervous system: Alert and oriented. No focal neurological deficits. Extremities: Symmetric 5 x 5 power. Skin: No rashes, lesions or ulcers Psychiatry: Judgement and insight appear normal. Mood & affect appropriate.     Data Reviewed: I have personally reviewed following labs and imaging studies  CBC: Recent Labs  Lab 04/19/18 1300 04/19/18 1733 04/20/18 0500  WBC 8.5  --  7.1  NEUTROABS 6.0  --   --   HGB 17.7* 16.0* 13.9  HCT 54.5* 47.0* 43.7  MCV 106.2*  --  105.3*  PLT 101*  --  132*   Basic Metabolic Panel: Recent Labs  Lab 04/19/18 1300 04/19/18 1733 04/20/18 0500  NA 145 143 142  K 4.1 3.9 3.7  CL 107 107 107  CO2 21*  --  23  GLUCOSE 78 86 96  BUN  8 6 11   CREATININE 0.56 0.50 0.61  CALCIUM 9.4  --  8.5*  MG 1.7  --   --    GFR: CrCl cannot be calculated (Unknown ideal weight.). Liver Function Tests: Recent Labs  Lab 04/19/18 1300  AST 32  ALT 20  ALKPHOS 92  BILITOT 0.9  PROT 7.6  ALBUMIN 4.4   No results for input(s): LIPASE, AMYLASE in the last 168 hours. No results for input(s): AMMONIA  in the last 168 hours. Coagulation Profile: No results for input(s): INR, PROTIME in the last 168 hours. Cardiac Enzymes: No results for input(s): CKTOTAL, CKMB, CKMBINDEX, TROPONINI in the last 168 hours. BNP (last 3 results) No results for input(s): PROBNP in the last 8760 hours. HbA1C: No results for input(s): HGBA1C in the last 72 hours. CBG: No results for input(s): GLUCAP in the last 168 hours. Lipid Profile: No results for input(s): CHOL, HDL, LDLCALC, TRIG, CHOLHDL, LDLDIRECT in the last 72 hours. Thyroid Function Tests: No results for input(s): TSH, T4TOTAL, FREET4, T3FREE, THYROIDAB in the last 72 hours. Anemia Panel: No results for input(s): VITAMINB12, FOLATE, FERRITIN, TIBC, IRON, RETICCTPCT in the last 72 hours. Sepsis Labs: No results for input(s): PROCALCITON, LATICACIDVEN in the last 168 hours.  No results found for this or any previous visit (from the past 240 hour(s)).       Radiology Studies: No results found.      Scheduled Meds: . folic acid  1 mg Oral Daily  . heparin  5,000 Units Subcutaneous Q8H  . hydrALAZINE  25 mg Oral BID  . levETIRAcetam  500 mg Oral BID  . multivitamin with minerals  1 tablet Oral Daily  . polyethylene glycol  17 g Oral Daily  . senna-docusate  1 tablet Oral BID  . sodium chloride flush  3 mL Intravenous Q12H  . thiamine  100 mg Oral Daily   Or  . thiamine  100 mg Intravenous Daily   Continuous Infusions: . sodium chloride       LOS: 0 days     Alwyn RenElizabeth G Lynnzie Blackson, MD [facility]  If 7PM-7AM, please contact night-coverage [email or web address] Password [email or web address] 04/26/2018, 12:37 PM

## 2018-04-27 DIAGNOSIS — W06XXXA Fall from bed, initial encounter: Secondary | ICD-10-CM | POA: Diagnosis present

## 2018-04-27 DIAGNOSIS — S32401A Unspecified fracture of right acetabulum, initial encounter for closed fracture: Secondary | ICD-10-CM | POA: Diagnosis not present

## 2018-04-27 DIAGNOSIS — R4020 Unspecified coma: Secondary | ICD-10-CM | POA: Diagnosis not present

## 2018-04-27 DIAGNOSIS — S32411D Displaced fracture of anterior wall of right acetabulum, subsequent encounter for fracture with routine healing: Secondary | ICD-10-CM | POA: Diagnosis not present

## 2018-04-27 NOTE — Progress Notes (Signed)
PROGRESS NOTE    Rose Bridges  XBJ:[id number] DOB: [birth year] DOA: 04/19/2018 PCP: Knox RoyaltyJones, Enrico, MD  Brief Narrative:57 year old with past medical history significant for osteogenesis imperfecta, osteoporosis, Hypertension, ongoing alcohol abuse as well as seizures disorder noncompliance with Keppra who presents to the ED after falling at home. Patient has been drinking significant amount of alcohol, she fell and wake up on the floor with right hip pain and bleeding from her chin.  CT pelvis showed sublet acute fracture of the anterior right acetabulum with subtle displacement. Subacute to chronic right inferior pubic ramus fracture. Patient was evaluated by orthopedic who are recommending touchdown on the right side, no surgery indicated. Assessment & Plan:   Principal Problem:   Acetabular fracture (HCC) Active Problems:   Loss of consciousness (HCC)   Seizure (HCC)   Recurrent fall suspect related to alcohol abuse. Carotid Doppler no significant stenosis. Counseling provided regarding alcohol use.  Right anterior acetabular fracture, chronic right inferior pubic ramus fracture; Evaluated by orthopedic who recommended touch down weight bearing on the right leg.  History of seizure; continue with Keppra  Hypertension;blood pressure remains high start Norvasc 5 mg daily.  Constipation; Continue with MiraLAX, Senokot. suppository order  Dehydrationresolved with IV fluids   Estimated body mass index is 22.45 kg/m as calculated from the following:   Height as of this encounter: 5\' 5"  (1.651 m).   Weight as of this encounter: 61.2 kg.  DVT prophylaxis: heparin Code Status: full Family Communication:none Disposition Plan: Pending placement   Consultants:  ortho  Procedures:none Antimicrobials:none Subjective: Resting in bed in nad Objective: Vitals:   04/26/18 1425 04/26/18 2350 04/27/18 0623 04/27/18 0723  BP: 121/74 103/67 114/78   Pulse: 75 66  64   Resp: 18 14 16    Temp: 98.2 F (36.8 C) 98.4 F (36.9 C) 98 F (36.7 C)   TempSrc: Oral Oral Oral   SpO2: 95% 94% 97%   Weight:    61.2 kg  Height:        Intake/Output Summary (Last 24 hours) at 04/27/2018 1314 Last data filed at 04/27/2018 0533 Gross per 24 hour  Intake 300 ml  Output 450 ml  Net -150 ml   Filed Weights   04/27/18 0723  Weight: 61.2 kg    Examination:  General exam: Appears calm and comfortable  Respiratory system: Clear to auscultation. Respiratory effort normal. Cardiovascular system: S1 & S2 heard, RRR. No JVD, murmurs, rubs, gallops or clicks. No pedal edema. Gastrointestinal system: Abdomen is nondistended, soft and nontender. No organomegaly or masses felt. Normal bowel sounds heard. Central nervous system: Alert and oriented. No focal neurological deficits. Extremities: Symmetric 5 x 5 power. Skin: No rashes, lesions or ulcers Psychiatry: Judgement and insight appear normal. Mood & affect appropriate.     Data Reviewed: I have personally reviewed following labs and imaging studies  CBC: No results for input(s): WBC, NEUTROABS, HGB, HCT, MCV, PLT in the last 168 hours. Basic Metabolic Panel: No results for input(s): NA, K, CL, CO2, GLUCOSE, BUN, CREATININE, CALCIUM, MG, PHOS in the last 168 hours. GFR: Estimated Creatinine Clearance: 69 mL/min (by C-G formula based on SCr of 0.61 mg/dL). Liver Function Tests: No results for input(s): AST, ALT, ALKPHOS, BILITOT, PROT, ALBUMIN in the last 168 hours. No results for input(s): LIPASE, AMYLASE in the last 168 hours. No results for input(s): AMMONIA in the last 168 hours. Coagulation Profile: No results for input(s): INR, PROTIME in the last 168 hours. Cardiac Enzymes: No results for  input(s): CKTOTAL, CKMB, CKMBINDEX, TROPONINI in the last 168 hours. BNP (last 3 results) No results for input(s): PROBNP in the last 8760 hours. HbA1C: No results for input(s): HGBA1C in the last 72  hours. CBG: No results for input(s): GLUCAP in the last 168 hours. Lipid Profile: No results for input(s): CHOL, HDL, LDLCALC, TRIG, CHOLHDL, LDLDIRECT in the last 72 hours. Thyroid Function Tests: No results for input(s): TSH, T4TOTAL, FREET4, T3FREE, THYROIDAB in the last 72 hours. Anemia Panel: No results for input(s): VITAMINB12, FOLATE, FERRITIN, TIBC, IRON, RETICCTPCT in the last 72 hours. Sepsis Labs: No results for input(s): PROCALCITON, LATICACIDVEN in the last 168 hours.  No results found for this or any previous visit (from the past 240 hour(s)).       Radiology Studies: No results found.      Scheduled Meds: . folic acid  1 mg Oral Daily  . heparin  5,000 Units Subcutaneous Q8H  . hydrALAZINE  25 mg Oral BID  . levETIRAcetam  500 mg Oral BID  . multivitamin with minerals  1 tablet Oral Daily  . polyethylene glycol  17 g Oral Daily  . senna-docusate  1 tablet Oral BID  . sodium chloride flush  3 mL Intravenous Q12H  . thiamine  100 mg Oral Daily   Or  . thiamine  100 mg Intravenous Daily   Continuous Infusions: . sodium chloride       LOS: 0 days     Rose RenElizabeth G Massie Mees, MD [facility]  If 7PM-7AM, please contact night-coverage [email or web address] Password [email or web address] 04/27/2018, 1:14 PM

## 2018-04-28 DIAGNOSIS — Q780 Osteogenesis imperfecta: Secondary | ICD-10-CM

## 2018-04-28 DIAGNOSIS — S32411D Displaced fracture of anterior wall of right acetabulum, subsequent encounter for fracture with routine healing: Secondary | ICD-10-CM | POA: Diagnosis not present

## 2018-04-28 NOTE — Progress Notes (Signed)
[facility] PROGRESS NOTE  Rose Bridges  WUJ:[id number] DOB: [birth year] DOA: 04/19/2018 PCP: Knox RoyaltyJones, Enrico, MD  Brief Narrative: Rose Bridges is a 57 y.o. female with a history of osteogenesis imperfecta, osteoporosis, HTN, seizure disorder, and alcohol abuse who presented after a fall at home on 12/21 found to have an acute right anterior acetabulum fracture with subtle displacement in addition to subacute-chronic right inferior pubic ramus fracture. Orthopedics advised against surgery. Physical therapy has recommended SNF at discharge and the patient was discharged 12/23. Unfortunately, the patient's insurance expires 04/30/2018 and she is therefore difficult to place. She continues to have restricted mobility, no 24 hours supervision or financial means to obtain this, so she is remaining inpatient.  Subjective: Pain is well controlled, no symptoms of alcohol withdrawal. Feels weak when getting up, doesn't think she'd do well at home. Neither does PT.  Objective: BP 111/71   Pulse 81   Temp 98.5 F (36.9 C) (Oral)   Resp 18   Ht 5\' 5"  (1.651 m)   Wt 61.2 kg   SpO2 94%   BMI 22.45 kg/m   Gen: No distress Pulm: Clear and nonlabored on room air  CV: RRR, no murmur, no JVD, no edema GI: Soft, NT, ND, +BS  Neuro: Alert and oriented. No focal deficits. No tremor or asterixis. Ext: Warm, thigh compartment soft, distally NVI, warm, brisk cap refill. Skin: No rashes, lesions or ulcers  Assessment & Plan: Recurrent fall at home: Suspected to be related to alcohol intoxication in pattern of abuse.  - Continue PT/OT to improve functional mobility. Home is not a safe discharge plan at this time with her impairments/weakness and propensity for fractures due to osteogenesis imperfecta and osteoporosis.  - Continue pain control as ordered in addition to bowel regimen - TDWB per orthopedics  Seizure disorder:  - Continue keppra  Alcohol abuse:  - Cessation counseling provided.  Does not believe she is an alcoholic.  - No evidence of withdrawal currently  HTN:  - Hydralazine 25mg  po BID  Tyrone Nineyan B Tamu Golz, MD [facility] [email or web address] Password [email or web address] 04/28/2018, 9:12 PM

## 2018-04-28 NOTE — Progress Notes (Signed)
CSW has been following to assist with forming DC plan. SNF recommended and desired by pt and family however pt's insurance coverage is ending tomorrow and while family is going to apply for medicaid for her, will not have payor source for SNF in immediate future. Pt states she feels she could manage safely at home with rollator and bedside toilet once she is strong enough to get OOB and ambulate some without assistance. Family unable to provide daily supervision and pt has no finances for caregivers. Will continue investigating options, however currently pt will be planning to transition home once safely able.  Ilean SkillMeghan Dajanay Northrup, MSW, LCSW Clinical Social Work 04/28/2018 [phone]

## 2018-04-28 NOTE — Progress Notes (Signed)
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  CT pelvis showed sublet acute fracture of the anterior right acetabulum with subtle displacement.  Subacute to chronic right inferior pubic ramus fracture    PT Comments    Pt continues to participate well. She requires repeated cueing for adherence to TDWB status. She continues to require Min-Mod assist for mobility. Pt will need SNF for continued rehab.    Follow Up Recommendations  SNF     Equipment Recommendations  Rolling walker with 5" wheels    Recommendations for Other Services       Precautions / Restrictions Precautions Precautions: Fall Restrictions Weight Bearing Restrictions: Yes RLE Weight Bearing: Touchdown weight bearing    Mobility  Bed Mobility Overal bed mobility: Needs Assistance Bed Mobility: Supine to Sit;Sit to Supine     Supine to sit: Min guard Sit to supine: Mod assist   General bed mobility comments: Assist for LEs back onto bed.   Transfers Overall transfer level: Needs assistance Equipment used: Rolling walker (2 wheeled) Transfers: Sit to/from UGI CorporationStand;Stand Pivot Transfers Sit to Stand: Min assist Stand pivot transfers: Min assist       General transfer comment: Assist to rise, steady. VCs safety, technique, adherence to TDWB. Pt tends to put too much weight through R LE during sit<>stand transitions.   Ambulation/Gait Ambulation/Gait assistance: Min assist Gait Distance (Feet): 75 Feet Assistive device: Rolling walker (2 wheeled) Gait Pattern/deviations: Step-to pattern     General Gait Details: VCs safety, technqiue, sequence,  adherence to WB status. Pt did a better job of adhering to TDWB status on today. Limited distance for safety and pain control.    Stairs             Wheelchair Mobility    Modified Rankin (Stroke Patients Only)       Balance Overall balance assessment: History of Falls;Needs assistance         Standing balance support: Bilateral upper extremity supported Standing balance-Leahy Scale: Poor                              Cognition Arousal/Alertness: Awake/alert Behavior During Therapy: WFL for tasks assessed/performed Overall Cognitive Status: Within Functional Limits for tasks assessed                                        Exercises      General Comments        Pertinent Vitals/Pain Pain Assessment: 0-10 Pain Score: 9  Pain Location: R hip, with mobility  Pain Descriptors / Indicators: Discomfort;Sore;Sharp Pain Intervention(s): Limited activity within patient's tolerance;Repositioned    Home Living                      Prior Function            PT Goals (current goals can now be found in the care plan section) Progress towards PT goals: Progressing toward goals    Frequency    Min 3X/week      PT Plan Current plan remains appropriate    Co-evaluation  AM-PAC PT "6 Clicks" Mobility   Outcome Measure  Help needed turning from your back to your side while in a flat bed without using bedrails?: A Little Help needed moving from lying on your back to sitting on the side of a flat bed without using bedrails?: A Little Help needed moving to and from a bed to a chair (including a wheelchair)?: A Little Help needed standing up from a chair using your arms (e.g., wheelchair or bedside chair)?: A Little Help needed to walk in hospital room?: A Little Help needed climbing 3-5 steps with a railing? : A Lot 6 Click Score: 17    End of Session Equipment Utilized During Treatment: Gait  belt Activity Tolerance: Patient tolerated treatment well Patient left: in bed;with call bell/phone within reach;with bed alarm set   PT Visit Diagnosis: Muscle weakness (generalized) (M62.81);Pain;Difficulty in walking, not elsewhere classified (R26.2);Other abnormalities of gait and mobility (R26.89);History of falling (Z91.81);Repeated falls (R29.6);Unsteadiness on feet (R26.81) Pain - Right/Left: Right Pain - part of body: Hip     Time: [id number] PT Time Calculation (min) (ACUTE ONLY): 20 min  Charges:  $Gait Training: 8-22 mins                        Rebeca AlertJannie Glorie Dowlen, PT Acute Rehabilitation Services Pager: [phone] Office: [phone]

## 2018-04-29 ENCOUNTER — Observation Stay (HOSPITAL_COMMUNITY): Payer: BLUE CROSS/BLUE SHIELD

## 2018-04-29 DIAGNOSIS — R4020 Unspecified coma: Secondary | ICD-10-CM | POA: Diagnosis not present

## 2018-04-29 DIAGNOSIS — Q780 Osteogenesis imperfecta: Secondary | ICD-10-CM

## 2018-04-29 DIAGNOSIS — S32411D Displaced fracture of anterior wall of right acetabulum, subsequent encounter for fracture with routine healing: Secondary | ICD-10-CM | POA: Diagnosis not present

## 2018-04-29 DIAGNOSIS — W06XXXA Fall from bed, initial encounter: Secondary | ICD-10-CM

## 2018-04-29 NOTE — Progress Notes (Signed)
[facility] PROGRESS NOTE  Rose Bridges  RUE:[id number] DOB: 06/09/1960 DOA: 04/19/2018 PCP: Knox RoyaltyJones, Enrico, MD  Brief Narrative: Rose Bridges is a 57 y.o. female with a history of osteogenesis imperfecta, osteoporosis, HTN, seizure disorder, and alcohol abuse who presented after a fall at home on 12/21 found to have an acute right anterior acetabulum fracture with subtle displacement in addition to subacute-chronic right inferior pubic ramus fracture. Orthopedics advised against surgery. Physical therapy has recommended SNF at discharge and the patient was discharged 12/23. Unfortunately, the patient's insurance expires 04/30/2018 and she is therefore difficult to place. She continues to have restricted mobility, no 24 hours supervision or financial means to obtain this, so discharging home is not felt to be safe. On 12/31 she's reporting increasing pain with decreased functional mobility, so repeat CT is ordered.  Subjective: Over the past 36 hours she's noticed increasing pain, "something isn't right," in the right anterior hip/groin similar to but more severe than her previous pain. Denies trauma.  Objective: BP 109/64 (BP Location: Right Arm)   Pulse 80   Temp 97.9 F (36.6 C) (Oral)   Resp 14   Ht 5\' 5"  (1.651 m)   Wt 61.2 kg   SpO2 91%   BMI 22.45 kg/m   Gen: [age] y.o. female in no distress Pulm: Nonlabored breathing room air. Clear. CV: Regular rate and rhythm. No murmur, rub, or gallop. No JVD, no dependent edema. GI: Abdomen soft, non-tender, non-distended, with normoactive bowel sounds.  Ext: Warm, tenderness to anterior palpation of pelvis/hip on right. Compartment soft. Distal leg is warm, brisk cap refill, sensation and motor function intact. Skin: No new rashes, lesions or ulcers on visualized skin.  Neuro: Alert and oriented. No focal neurological deficits. Psych: Judgement and insight appear fair. Mood euthymic & affect congruent. Behavior is appropriate.     Assessment & Plan: Recurrent fall at home: Suspected to be related to alcohol intoxication in pattern of abuse.  - Continue PT/OT to improve functional mobility. Home is not a safe discharge plan at this time with her impairments/weakness, baseline abnormal gait due to left hip fracture, and propensity for fractures due to osteogenesis imperfecta and osteoporosis.  - Continue pain control as ordered in addition to bowel regimen - TDWB per orthopedics - Pain has worsened and patient reporting increasing pain with mobility. She's at high risk for progression of fracture, so repeat CT imaging is ordered.  Seizure disorder:  - Continue keppra  Alcohol abuse:  - Cessation counseling provided. Does not believe she is an alcoholic.  - No evidence of withdrawal currently  HTN:  - Hydralazine 25mg  po BID, normotensive.  Tyrone Nineyan B Kylian Loh, MD [facility] [email or web address] Password [email or web address] 04/29/2018, 3:16 PM

## 2018-04-29 NOTE — Progress Notes (Signed)
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  CT pelvis showed sublet acute fracture of the anterior right acetabulum with subtle displacement.  Subacute to chronic right inferior pubic ramus fracture    PT Comments    Pt continues to require at least min assist for mobility for safety.  Pt also continues to require cues for TDWB status.  Pt reports difficulty with SNF placement and may have to d/c home.  Pt is a HIGH fall risk and will need assist for mobility initially upon d/c.   Follow Up Recommendations  SNF     Equipment Recommendations  Rolling walker with 5" wheels    Recommendations for Other Services       Precautions / Restrictions Precautions Precautions: Fall Restrictions Weight Bearing Restrictions: Yes RLE Weight Bearing: Touchdown weight bearing Other Position/Activity Restrictions: Pt reporting she has difficulty with RLE TDWB, stating "my right leg is better than my left". PT with frequent verbal cuing for TDWB.     Mobility  Bed Mobility Overal bed mobility: Needs Assistance Bed Mobility: Supine to Sit     Supine to sit: Min guard     General bed mobility comments: increased time and effort  Transfers Overall transfer level: Needs assistance Equipment used: Rolling walker (2 wheeled) Transfers: Sit to/from Stand Sit to Stand: Min assist         General transfer comment: Assist to rise and steady. VCs safety, technique, adherence to TDWB  Ambulation/Gait Ambulation/Gait assistance: Min assist Gait Distance (Feet): 120 Feet Assistive device: Rolling walker (2 wheeled) Gait  Pattern/deviations: Step-to pattern Gait velocity: decr    General Gait Details: VCs safety, technique, sequence, adherence to WB status. improved adherence to TDWB status.  attempted to limit distance however pt wished to improve (pt cautioned on moderation as she reports pain a little more today) HR also increased to 135 bpm   Stairs             Wheelchair Mobility    Modified Rankin (Stroke Patients Only)       Balance                                            Cognition Arousal/Alertness: Awake/alert Behavior During Therapy: WFL for tasks assessed/performed Overall Cognitive Status: Within Functional Limits for tasks assessed                                        Exercises      General Comments        Pertinent Vitals/Pain Pain Assessment: 0-10 Pain Score: 7  Pain Location: R hip, with mobility  Pain Descriptors / Indicators: Discomfort;Sore Pain Intervention(s): Monitored during session;Repositioned    Home Living                      Prior Function            PT Goals (current goals can now be found in the care plan section) Progress towards PT goals: Progressing toward goals  Frequency    Min 3X/week      PT Plan Current plan remains appropriate    Co-evaluation              AM-PAC PT "6 Clicks" Mobility   Outcome Measure  Help needed turning from your back to your side while in a flat bed without using bedrails?: A Little Help needed moving from lying on your back to sitting on the side of a flat bed without using bedrails?: A Little Help needed moving to and from a bed to a chair (including a wheelchair)?: A Little Help needed standing up from a chair using your arms (e.g., wheelchair or bedside chair)?: A Little Help needed to walk in hospital room?: A Little Help needed climbing 3-5 steps with a railing? : A Lot 6 Click Score: 17    End of Session Equipment Utilized During  Treatment: Gait belt Activity Tolerance: Patient limited by pain Patient left: with call bell/phone within reach;in chair Nurse Communication: Mobility status(NT notified of mobility) PT Visit Diagnosis: Pain;Other abnormalities of gait and mobility (R26.89);History of falling (Z91.81);Unsteadiness on feet (R26.81) Pain - Right/Left: Right Pain - part of body: Hip     Time: 1040-1101 PT Time Calculation (min) (ACUTE ONLY): 21 min  Charges:  $Gait Training: 8-22 mins                    Zenovia JarredKati Jahara Dail, PT, DPT Acute Rehabilitation Services Office: [phone] Pager: [phone]  Sarajane JewsLEMYRE,Rose E 04/29/2018, 1:08 PM

## 2018-04-30 DIAGNOSIS — S32411D Displaced fracture of anterior wall of right acetabulum, subsequent encounter for fracture with routine healing: Secondary | ICD-10-CM | POA: Diagnosis not present

## 2018-04-30 DIAGNOSIS — W06XXXA Fall from bed, initial encounter: Secondary | ICD-10-CM | POA: Diagnosis not present

## 2018-04-30 NOTE — Progress Notes (Signed)
PROGRESS NOTE    Rose Bridges  [id number] DOB: 1960/10/24 DOA: 04/19/2018 PCP: Knox Royalty, MD  Brief Narrative:  58 y.o. female with a history of osteogenesis imperfecta, osteoporosis, HTN, seizure disorder, and alcohol abuse who presented after a fall at home on 12/21 found to have an acute right anterior acetabulum fracture with subtle displacement in addition to subacute-chronic right inferior pubic ramus fracture. Orthopedics advised against surgery. Physical therapy has recommended SNF at discharge and the patient was discharged 12/23. Unfortunately, the patient's insurance expires 04/30/2018 and she is therefore difficult to place. She continues to have restricted mobility, no 24 hours supervision or financial means to obtain this, so discharging home is not felt to be safe. On 12/31 she's reporting increasing pain with decreased functional mobility, so repeat CT is ordered. Assessment & Plan:   Principal Problem:   Acetabular fracture (HCC) Active Problems:   Loss of consciousness (HCC)   Seizure (HCC)   Accidental fall from bed   Osteogenesis imperfecta  Recurrent fall at home: Suspected to be related to alcohol intoxication in pattern of abuse.  - Continue PT/OT to improve functional mobility. Home is not a safe discharge plan at this time with her impairments/weakness, baseline abnormal gait due to left hip fracture, and propensity for fractures due to osteogenesis imperfecta and osteoporosis.  - Continue pain control as ordered in addition to bowel regimen - TDWB per orthopedics - Pain has worsened and patient reporting increasing pain with mobility. She's at high risk for progression of fracture, so repeat CT imaging is ordered.  Repeat  CTs show stable fracture.  Seizure disorder:  - Continue keppra  Alcohol abuse:  - Cessation counseling provided. Does not believe she is an alcoholic.  - No evidence of withdrawal currently  HTN:  - Hydralazine 25mg  po BID,  normotensive.      Estimated body mass index is 22.45 kg/m as calculated from the following:   Height as of this encounter: 5\' 5"  (1.651 m).   Weight as of this encounter: 61.2 kg.  DVT prophylaxis: Heparin Code Status: Full code Family Communication: None Disposition Plan pending SNF authorization patient does not have insurance working on [employer]: None  Procedures: None  Antimicrobials: None  Subjective: She is resting in bed she reported yesterday she had increased pain so a CT scan of the pelvis was ordered shows no evidence of new fracture today her pain is back to her baseline denies any nausea vomiting. Objective: Vitals:   04/29/18 0500 04/29/18 1432 04/29/18 2346 04/30/18 0739  BP:  109/64 107/72 122/72  Pulse:  80 74 80  Resp:  14 18   Temp:  97.9 F (36.6 C) 98.2 F (36.8 C) 98.5 F (36.9 C)  TempSrc:  Oral Oral   SpO2: 93% 91% (!) 89% (!) 89%  Weight:      Height:       No intake or output data in the 24 hours ending 04/30/18 1151 Filed Weights   04/27/18 0723  Weight: 61.2 kg    Examination:  General exam: Appears calm and comfortable  Respiratory system: Clear to auscultation. Respiratory effort normal. Cardiovascular system: S1 & S2 heard, RRR. No JVD, murmurs, rubs, gallops or clicks. No pedal edema. Gastrointestinal system: Abdomen is nondistended, soft and nontender. No organomegaly or masses felt. Normal bowel sounds heard. Central nervous system: Alert and oriented. No focal neurological deficits. Extremities: Symmetric 5 x 5 power. Skin: No rashes, lesions or ulcers Psychiatry: Judgement and insight  appear normal. Mood & affect appropriate.     Data Reviewed: I have personally reviewed following labs and imaging studies  CBC: No results for input(s): WBC, NEUTROABS, HGB, HCT, MCV, PLT in the last 168 hours. Basic Metabolic Panel: No results for input(s): NA, K, CL, CO2, GLUCOSE, BUN, CREATININE, CALCIUM, MG, PHOS in  the last 168 hours. GFR: Estimated Creatinine Clearance: 69 mL/min (by C-G formula based on SCr of 0.61 mg/dL). Liver Function Tests: No results for input(s): AST, ALT, ALKPHOS, BILITOT, PROT, ALBUMIN in the last 168 hours. No results for input(s): LIPASE, AMYLASE in the last 168 hours. No results for input(s): AMMONIA in the last 168 hours. Coagulation Profile: No results for input(s): INR, PROTIME in the last 168 hours. Cardiac Enzymes: No results for input(s): CKTOTAL, CKMB, CKMBINDEX, TROPONINI in the last 168 hours. BNP (last 3 results) No results for input(s): PROBNP in the last 8760 hours. HbA1C: No results for input(s): HGBA1C in the last 72 hours. CBG: No results for input(s): GLUCAP in the last 168 hours. Lipid Profile: No results for input(s): CHOL, HDL, LDLCALC, TRIG, CHOLHDL, LDLDIRECT in the last 72 hours. Thyroid Function Tests: No results for input(s): TSH, T4TOTAL, FREET4, T3FREE, THYROIDAB in the last 72 hours. Anemia Panel: No results for input(s): VITAMINB12, FOLATE, FERRITIN, TIBC, IRON, RETICCTPCT in the last 72 hours. Sepsis Labs: No results for input(s): PROCALCITON, LATICACIDVEN in the last 168 hours.  No results found for this or any previous visit (from the past 240 hour(s)).       Radiology Studies: Ct Hip Right Wo Contrast  Result Date: 04/29/2018 CLINICAL DATA:  Worsening right hip pain over the past 2 days. EXAM: CT OF THE RIGHT HIP WITHOUT CONTRAST TECHNIQUE: Multidetector CT imaging of the right hip was performed according to the standard protocol. Multiplanar CT image reconstructions were also generated. COMPARISON:  None. FINDINGS: Bones/Joint/Cartilage Nondisplaced fracture of the anterior aspect of the right acetabulum not involving the articular surface. No interval change compared with 04/19/2018. Deformity of the mid right inferior pubic ramus with mild sclerosis along the midportion likely reflecting a healing versus healed fracture. No  surrounding callus formation. No aggressive osseous lesion. Normal alignment. No joint effusion. Ligaments Ligaments are suboptimally evaluated by CT. Muscles and Tendons Muscles are normal.  No muscle atrophy. Soft tissue No fluid collection or hematoma.  No soft tissue mass. IMPRESSION: 1. Stable nondisplaced fracture along the anterior cortex of the anterior right acetabulum without articular surface involvement. 2. Deformity of the mid right inferior pubic ramus with mild sclerosis along the midportion likely reflecting a healing versus healed fracture. Electronically Signed   By: Elige Ko   On: 04/29/2018 16:13        Scheduled Meds: . folic acid  1 mg Oral Daily  . heparin  5,000 Units Subcutaneous Q8H  . hydrALAZINE  25 mg Oral BID  . levETIRAcetam  500 mg Oral BID  . multivitamin with minerals  1 tablet Oral Daily  . polyethylene glycol  17 g Oral Daily  . senna-docusate  1 tablet Oral BID  . sodium chloride flush  3 mL Intravenous Q12H  . thiamine  100 mg Oral Daily   Or  . thiamine  100 mg Intravenous Daily   Continuous Infusions: . sodium chloride       LOS: 0 days     Alwyn Ren, MD [facility]  If 7PM-7AM, please contact night-coverage [email or web address] Password [email or web address] 04/30/2018, 11:51 AM

## 2018-05-01 DIAGNOSIS — S32411D Displaced fracture of anterior wall of right acetabulum, subsequent encounter for fracture with routine healing: Secondary | ICD-10-CM | POA: Diagnosis not present

## 2018-05-01 DIAGNOSIS — W06XXXA Fall from bed, initial encounter: Secondary | ICD-10-CM | POA: Diagnosis not present

## 2018-05-01 NOTE — Progress Notes (Signed)
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  CT pelvis showed sublet acute fracture of the anterior right acetabulum with subtle displacement.  Subacute to chronic right inferior pubic ramus fracture    PT Comments    Pt assisted with ambulating in hallway.  Pt agreeable to ambulate with staff again this afternoon (pt eager to mobilize and get back home as soon as possible).  Pt performed ankle pumps, quad sets, and gluteal sets in recliner and verbally educated to perform gentle heel slides and hip abduction as tolerated in supine (once back in bed).  Pt cautioned on allowing healing and performing activities in moderation.  Pt awaiting SNF.   Follow Up Recommendations  SNF     Equipment Recommendations  Rolling walker with 5" wheels    Recommendations for Other Services       Precautions / Restrictions Precautions Precautions: Fall Restrictions Weight Bearing Restrictions: Yes RLE Weight Bearing: Touchdown weight bearing    Mobility  Bed Mobility Overal bed mobility: Needs Assistance Bed Mobility: Supine to Sit     Supine to sit: Supervision     General bed mobility comments: increased time and effort  Transfers Overall transfer level: Needs assistance Equipment used: Rolling walker (2 wheeled) Transfers: Sit to/from Stand Sit to Stand: Min assist         General transfer comment: Assist to rise and steady. VCs safety, technique, adherence to TDWB  Ambulation/Gait Ambulation/Gait assistance: Min assist Gait Distance (Feet): 120 Feet Assistive device: Rolling walker (2 wheeled) Gait Pattern/deviations:  Step-to pattern Gait velocity: decr    General Gait Details: VCs safety, technique, sequence, adherence to WB status.    Stairs             Wheelchair Mobility    Modified Rankin (Stroke Patients Only)       Balance                                            Cognition Arousal/Alertness: Awake/alert Behavior During Therapy: WFL for tasks assessed/performed Overall Cognitive Status: Within Functional Limits for tasks assessed                                        Exercises General Exercises - Lower Extremity Ankle Circles/Pumps: AROM;10 reps;Both Quad Sets: AROM;10 reps;Both Gluteal Sets: AROM;10 reps;Both    General Comments        Pertinent Vitals/Pain Pain Assessment: 0-10 Pain Score: 6  Pain Location: R hip, with mobility  Pain Descriptors / Indicators: Discomfort;Sore Pain Intervention(s): Monitored during session;Limited activity within patient's tolerance;Repositioned    Home Living                      Prior Function            PT Goals (current goals can now be found in the care plan section) Progress towards PT goals: Progressing toward goals    Frequency    Min 3X/week      PT Plan Current plan remains  appropriate    Co-evaluation              AM-PAC PT "6 Clicks" Mobility   Outcome Measure  Help needed turning from your back to your side while in a flat bed without using bedrails?: A Little Help needed moving from lying on your back to sitting on the side of a flat bed without using bedrails?: A Little Help needed moving to and from a bed to a chair (including a wheelchair)?: A Little Help needed standing up from a chair using your arms (e.g., wheelchair or bedside chair)?: A Little Help needed to walk in hospital room?: A Little Help needed climbing 3-5 steps with a railing? : A Lot 6 Click Score: 17    End of Session   Activity Tolerance: Patient tolerated treatment  well Patient left: with call bell/phone within reach;in chair Nurse Communication: Mobility status PT Visit Diagnosis: Pain;Other abnormalities of gait and mobility (R26.89);History of falling (Z91.81);Unsteadiness on feet (R26.81) Pain - Right/Left: Right Pain - part of body: Hip     Time: 7654-6503 PT Time Calculation (min) (ACUTE ONLY): 25 min  Charges:  $Gait Training: 8-22 mins $Therapeutic Exercise: 8-22 mins                    Zenovia Jarred, PT, DPT Acute Rehabilitation Services Office: [phone] Pager: [phone]   Sarajane Jews 05/01/2018, 2:39 PM

## 2018-05-01 NOTE — Progress Notes (Signed)
PROGRESS NOTE    Rose Bridges  [id number] DOB: December 23, 1960 DOA: 04/19/2018 PCP: Knox RoyaltyJones, Enrico, MD  Brief Narrative:58 y.o.femalewith a history of osteogenesis imperfecta, osteoporosis, HTN, seizure disorder, and alcohol abuse who presented after a fall at home on 12/21 found to have an acute right anterior acetabulum fracture with subtle displacement in addition to subacute-chronic right inferior pubic ramus fracture. Orthopedics advised against surgery. Physical therapy has recommended SNF at discharge and the patient was discharged 12/23. Unfortunately, the patient's insurance expires 04/30/2018 and she is therefore difficult to place. She continues to have restricted mobility, no 24 hours supervision or financial means to obtain this, sodischarging home is not felt to be safe. On 12/31 she's reporting increasing pain with decreased functional mobility, so repeat CT is ordered.  Assessment & Plan:   Principal Problem:   Acetabular fracture (HCC) Active Problems:   Loss of consciousness (HCC)   Seizure (HCC)   Accidental fall from bed   Osteogenesis imperfecta   Recurrent fall at home: Suspected to be related to alcohol intoxication in pattern of abuse.  - Continue PT/OT to improve functional mobility. Home is not a safe discharge plan at this time with her impairments/weakness, baseline abnormal gait due to left hip fracture,and propensity for fractures due to osteogenesis imperfecta and osteoporosis.  - Continue pain control as ordered in addition to bowel regimen - TDWB per orthopedics - Pain has worsened and patient reporting increasing pain with mobility. She's at high risk for progression of fracture, so repeat CT imaging is ordered.  Repeat  CTs show stable fracture.  Seizure disorder:  - Continue keppra  Alcohol abuse:  - Cessation counseling provided. Does not believe she is an alcoholic.  - No evidence of withdrawal currently  HTN:  - Hydralazine 25mg  po  BID  Estimated body mass index is 22.45 kg/m as calculated from the following:   Height as of this encounter: 5\' 5"  (1.651 m).   Weight as of this encounter: 61.2 kg.  DVT prophylaxis: Heparin Code Status full code Family Communication: None Disposition Plan: Pending placement Consultants: None   Procedures: None Antimicrobials: None Subjective:  Resting in bed in no acute distress no new complaints today Objective: Vitals:   04/30/18 1500 04/30/18 2150 05/01/18 0456 05/01/18 1337  BP:  126/69 131/76 115/68  Pulse:  77 78 78  Resp:   18 18  Temp:   98.2 F (36.8 C) 98.9 F (37.2 C)  TempSrc:   Oral Oral  SpO2: 96% 96% 92% 94%  Weight:      Height:        Intake/Output Summary (Last 24 hours) at 05/01/2018 1358 Last data filed at 05/01/2018 1300 Gross per 24 hour  Intake 720 ml  Output 450 ml  Net 270 ml   Filed Weights   04/27/18 0723  Weight: 61.2 kg    Examination:  General exam: Appears calm and comfortable  Respiratory system: Clear to auscultation. Respiratory effort normal. Cardiovascular system: S1 & S2 heard, RRR. No JVD, murmurs, rubs, gallops or clicks. No pedal edema. Gastrointestinal system: Abdomen is nondistended, soft and nontender. No organomegaly or masses felt. Normal bowel sounds heard. Central nervous system: Alert and oriented. No focal neurological deficits. Extremities: Symmetric 5 x 5 power. Skin: No rashes, lesions or ulcers Psychiatry: Judgement and insight appear normal. Mood & affect appropriate.     Data Reviewed: I have personally reviewed following labs and imaging studies  CBC: No results for input(s): WBC, NEUTROABS, HGB, HCT, MCV,  PLT in the last 168 hours. Basic Metabolic Panel: No results for input(s): NA, K, CL, CO2, GLUCOSE, BUN, CREATININE, CALCIUM, MG, PHOS in the last 168 hours. GFR: Estimated Creatinine Clearance: 69 mL/min (by C-G formula based on SCr of 0.61 mg/dL). Liver Function Tests: No results for  input(s): AST, ALT, ALKPHOS, BILITOT, PROT, ALBUMIN in the last 168 hours. No results for input(s): LIPASE, AMYLASE in the last 168 hours. No results for input(s): AMMONIA in the last 168 hours. Coagulation Profile: No results for input(s): INR, PROTIME in the last 168 hours. Cardiac Enzymes: No results for input(s): CKTOTAL, CKMB, CKMBINDEX, TROPONINI in the last 168 hours. BNP (last 3 results) No results for input(s): PROBNP in the last 8760 hours. HbA1C: No results for input(s): HGBA1C in the last 72 hours. CBG: No results for input(s): GLUCAP in the last 168 hours. Lipid Profile: No results for input(s): CHOL, HDL, LDLCALC, TRIG, CHOLHDL, LDLDIRECT in the last 72 hours. Thyroid Function Tests: No results for input(s): TSH, T4TOTAL, FREET4, T3FREE, THYROIDAB in the last 72 hours. Anemia Panel: No results for input(s): VITAMINB12, FOLATE, FERRITIN, TIBC, IRON, RETICCTPCT in the last 72 hours. Sepsis Labs: No results for input(s): PROCALCITON, LATICACIDVEN in the last 168 hours.  No results found for this or any previous visit (from the past 240 hour(s)).       Radiology Studies: Ct Hip Right Wo Contrast  Result Date: 04/29/2018 CLINICAL DATA:  Worsening right hip pain over the past 2 days. EXAM: CT OF THE RIGHT HIP WITHOUT CONTRAST TECHNIQUE: Multidetector CT imaging of the right hip was performed according to the standard protocol. Multiplanar CT image reconstructions were also generated. COMPARISON:  None. FINDINGS: Bones/Joint/Cartilage Nondisplaced fracture of the anterior aspect of the right acetabulum not involving the articular surface. No interval change compared with 04/19/2018. Deformity of the mid right inferior pubic ramus with mild sclerosis along the midportion likely reflecting a healing versus healed fracture. No surrounding callus formation. No aggressive osseous lesion. Normal alignment. No joint effusion. Ligaments Ligaments are suboptimally evaluated by CT.  Muscles and Tendons Muscles are normal.  No muscle atrophy. Soft tissue No fluid collection or hematoma.  No soft tissue mass. IMPRESSION: 1. Stable nondisplaced fracture along the anterior cortex of the anterior right acetabulum without articular surface involvement. 2. Deformity of the mid right inferior pubic ramus with mild sclerosis along the midportion likely reflecting a healing versus healed fracture. Electronically Signed   By: Elige Ko   On: 04/29/2018 16:13        Scheduled Meds: . folic acid  1 mg Oral Daily  . heparin  5,000 Units Subcutaneous Q8H  . hydrALAZINE  25 mg Oral BID  . levETIRAcetam  500 mg Oral BID  . multivitamin with minerals  1 tablet Oral Daily  . polyethylene glycol  17 g Oral Daily  . senna-docusate  1 tablet Oral BID  . sodium chloride flush  3 mL Intravenous Q12H  . thiamine  100 mg Oral Daily   Or  . thiamine  100 mg Intravenous Daily   Continuous Infusions: . sodium chloride       LOS: 0 days     Alwyn Ren, MD [facility]  If 7PM-7AM, please contact night-coverage [email or web address] Password [email or web address] 05/01/2018, 1:58 PM

## 2018-05-02 DIAGNOSIS — W06XXXA Fall from bed, initial encounter: Secondary | ICD-10-CM | POA: Diagnosis not present

## 2018-05-02 DIAGNOSIS — R4020 Unspecified coma: Secondary | ICD-10-CM | POA: Diagnosis not present

## 2018-05-02 DIAGNOSIS — S32411D Displaced fracture of anterior wall of right acetabulum, subsequent encounter for fracture with routine healing: Secondary | ICD-10-CM | POA: Diagnosis not present

## 2018-05-02 MED ORDER — OXYCODONE HCL 5 MG PO TABS: 5.0000 mg | ORAL_TABLET | Freq: Four times a day (QID) | ORAL | 0 refills | Status: DC | PRN

## 2018-05-02 MED ORDER — HYDRALAZINE HCL 25 MG PO TABS: 25.0000 mg | ORAL_TABLET | Freq: Two times a day (BID) | ORAL | 0 refills | Status: DC

## 2018-05-02 NOTE — Discharge Summary (Addendum)
Physician Discharge Summary  Rose Bridges UJW:[id number] DOB: 04/19/1961 DOA: 04/19/2018  PCP: Knox Royalty, MD  Admit date: 04/19/2018 Discharge date: 05/02/2018  Admitted From: home Disposition:  home Recommendations for Outpatient Follow-up:  1. Follow up with PCP in 1-2 weeks 2. Please obtain BMP/CBC in one week 3. Follow up with ortho   Home Health:none Equipment/Devices: None Discharge Condition: Stable CODE STATUS full code Diet recommendation: Cardiac Brief/Interim Summary:57 y.o.femalewith a history of osteogenesis imperfecta, osteoporosis, HTN, seizure disorder, and alcohol abuse who presented after a fall at home on 12/21 found to have an acute right anterior acetabulum fracture with subtle displacement in addition to subacute-chronic right inferior pubic ramus fracture. Orthopedics advised against surgery. Physical therapy has recommended SNF at discharge and the patient was discharged 12/23. Unfortunately, the patient's insurance expires 04/30/2018 and she is therefore difficult to place. She continues to have restricted mobility, no 24 hours supervision or financial means to obtain this, sodischarging home is not felt to be safe. On 12/31 she's reporting increasing pain with decreased functional mobility, so repeat CT is ordered.  Discharge Diagnoses:  Principal Problem:   Acetabular fracture (HCC) Active Problems:   Loss of consciousness (HCC)   Seizure (HCC)   Accidental fall from bed   Osteogenesis imperfecta  Recurrent fall at home: Suspected to be related to alcohol intoxication in pattern of abuse.  Patient will be discharged home today with home PT.  Repeat CT of the pelvis shows fracture is stable.  Stable nondisplaced fracture along the anterior cortex of the anterior right acetabulum without articular surface involvement.  Continue pain control.  Toe-touch weightbearing as tolerated follow-up with Ortho.  Patient has been walking with PT and is steady on her  feet at the time of discharge.  She has been walking to the bathroom with 1 person assistance.  Seizure disorder:  - Continue keppra  Alcohol abuse:  - Cessation counseling provided  HTN:  - Hydralazine 25mg  po BID  Estimated body mass index is 22.45 kg/m as calculated from the following:   Height as of this encounter: 5\' 5"  (1.651 m).   Weight as of this encounter: 61.2 kg.  Discharge Instructions  Discharge Instructions    Call MD for:  difficulty breathing, headache or visual disturbances   Complete by:  As directed    Call MD for:  persistant nausea and vomiting   Complete by:  As directed    Call MD for:  severe uncontrolled pain   Complete by:  As directed    Diet - low sodium heart healthy   Complete by:  As directed    Diet - low sodium heart healthy   Complete by:  As directed    Increase activity slowly   Complete by:  As directed    Increase activity slowly   Complete by:  As directed    Increase activity slowly   Complete by:  As directed      Allergies as of 05/02/2018      Reactions   Codeine    "migraines"      Medication List    TAKE these medications   bisacodyl 5 MG EC tablet Commonly known as:  DULCOLAX Take 1 tablet (5 mg total) by mouth daily as needed for moderate constipation.   folic acid 1 MG tablet Commonly known as:  FOLVITE Take 1 tablet (1 mg total) by mouth daily.   hydrALAZINE 25 MG tablet Commonly known as:  APRESOLINE Take 1 tablet (25 mg  total) by mouth 2 (two) times daily.   levETIRAcetam 500 MG tablet Commonly known as:  KEPPRA Take 1 tablet (500 mg total) by mouth 2 (two) times daily. What changed:  when to take this   methocarbamol 500 MG tablet Commonly known as:  ROBAXIN Take 1 tablet (500 mg total) by mouth every 8 (eight) hours as needed for muscle spasms.   multivitamin with minerals Tabs tablet Take 1 tablet by mouth daily.   oxyCODONE 5 MG immediate release tablet Commonly known as:  Oxy  IR/ROXICODONE Take 1 tablet (5 mg total) by mouth every 6 (six) hours as needed for moderate pain.   polyethylene glycol packet Commonly known as:  MIRALAX / GLYCOLAX Take 17 g by mouth daily.   senna-docusate 8.6-50 MG tablet Commonly known as:  Senokot-S Take 1 tablet by mouth 2 (two) times daily.   thiamine 100 MG tablet Take 1 tablet (100 mg total) by mouth daily.      Follow-up Information    Sheral ApleyMurphy, Timothy D, MD Follow up on 05/08/2018.   Specialty:  Orthopedic Surgery Why:  appointment time 10:45 am Contact information: [address] [place] [address] [phone]          Allergies  Allergen Reactions  . Codeine     "migraines"     Consultations:none   Procedures/Studies: Dg Chest 1 View  Result Date: 04/19/2018 CLINICAL DATA:  Seizure last night.  Seizure disorder. EXAM: CHEST  1 VIEW COMPARISON:  Seven hundred nineteen FINDINGS: The heart size and mediastinal contours are within normal limits. Both lungs are clear. Internal fixation plate and screws seen in the proximal right humerus. IMPRESSION: No active disease. Electronically Signed   By: Myles RosenthalJohn  Stahl M.D.   On: 04/19/2018 13:20   Ct Head Wo Contrast  Result Date: 04/19/2018 CLINICAL DATA:  Seizure activity, found on floor, headache and neck pain, chin laceration EXAM: CT HEAD WITHOUT CONTRAST CT MAXILLOFACIAL WITHOUT CONTRAST CT CERVICAL SPINE WITHOUT CONTRAST TECHNIQUE: Multidetector CT imaging of the head, cervical spine, and maxillofacial structures were performed using the standard protocol without intravenous contrast. Multiplanar CT image reconstructions of the cervical spine and maxillofacial structures were also generated. COMPARISON:  11/05/2016 FINDINGS: CT HEAD FINDINGS Brain: Stable atrophy, chronic white matter microvascular change, and basal ganglia lacunar-type infarcts bilaterally. No acute intracranial hemorrhage, new mass lesion, definite new infarction, midline shift,  herniation, hydrocephalus, or extra-axial fluid collection. No focal mass effect or edema. Cisterns are patent. No cerebellar abnormality. Vascular: No hyperdense vessel or unexpected calcification. Skull: Normal. Negative for fracture or focal lesion. Other: None. CT MAXILLOFACIAL FINDINGS Osseous: No fracture or mandibular dislocation. No destructive process. Orbits: Negative. No traumatic or inflammatory finding. Sinuses: Chronic maxillary retention cysts or polyps noted. Soft tissues: No significant soft tissue asymmetry, bruising, swelling, or hematoma. All teeth have been extracted. Dentures noted. CT CERVICAL SPINE FINDINGS Alignment: Normal. Skull base and vertebrae: No acute fracture. No primary bone lesion or focal pathologic process. Soft tissues and spinal canal: No prevertebral fluid or swelling. No visible canal hematoma. Disc levels: Mild degenerative spondylosis, most pronounced at C5-6 and C6-7 with disc space narrowing, sclerosis and endplate osteophytes. Preserved vertebral body heights. Facets are aligned. No subluxation or dislocation. No acute osseous finding or fracture. Upper chest: Negative. Other: None. IMPRESSION: Atrophy and chronic white matter microvascular changes. Remote basal ganglia lacunar type infarcts. No acute intracranial abnormality by noncontrast CT. No acute facial bony trauma or fracture. Chronic maxillary retention cysts or polyps.  Cervical degenerative spondylosis without acute osseous finding or malalignment. Negative for fracture. Electronically Signed   By: Judie Petit.  Shick M.D.   On: 04/19/2018 13:40   Ct Cervical Spine Wo Contrast  Result Date: 04/19/2018 CLINICAL DATA:  Seizure activity, found on floor, headache and neck pain, chin laceration EXAM: CT HEAD WITHOUT CONTRAST CT MAXILLOFACIAL WITHOUT CONTRAST CT CERVICAL SPINE WITHOUT CONTRAST TECHNIQUE: Multidetector CT imaging of the head, cervical spine, and maxillofacial structures were performed using the standard  protocol without intravenous contrast. Multiplanar CT image reconstructions of the cervical spine and maxillofacial structures were also generated. COMPARISON:  11/05/2016 FINDINGS: CT HEAD FINDINGS Brain: Stable atrophy, chronic white matter microvascular change, and basal ganglia lacunar-type infarcts bilaterally. No acute intracranial hemorrhage, new mass lesion, definite new infarction, midline shift, herniation, hydrocephalus, or extra-axial fluid collection. No focal mass effect or edema. Cisterns are patent. No cerebellar abnormality. Vascular: No hyperdense vessel or unexpected calcification. Skull: Normal. Negative for fracture or focal lesion. Other: None. CT MAXILLOFACIAL FINDINGS Osseous: No fracture or mandibular dislocation. No destructive process. Orbits: Negative. No traumatic or inflammatory finding. Sinuses: Chronic maxillary retention cysts or polyps noted. Soft tissues: No significant soft tissue asymmetry, bruising, swelling, or hematoma. All teeth have been extracted. Dentures noted. CT CERVICAL SPINE FINDINGS Alignment: Normal. Skull base and vertebrae: No acute fracture. No primary bone lesion or focal pathologic process. Soft tissues and spinal canal: No prevertebral fluid or swelling. No visible canal hematoma. Disc levels: Mild degenerative spondylosis, most pronounced at C5-6 and C6-7 with disc space narrowing, sclerosis and endplate osteophytes. Preserved vertebral body heights. Facets are aligned. No subluxation or dislocation. No acute osseous finding or fracture. Upper chest: Negative. Other: None. IMPRESSION: Atrophy and chronic white matter microvascular changes. Remote basal ganglia lacunar type infarcts. No acute intracranial abnormality by noncontrast CT. No acute facial bony trauma or fracture. Chronic maxillary retention cysts or polyps. Cervical degenerative spondylosis without acute osseous finding or malalignment. Negative for fracture. Electronically Signed   By: Judie Petit.  Shick  M.D.   On: 04/19/2018 13:40   Ct Pelvis Wo Contrast  Result Date: 04/19/2018 CLINICAL DATA:  Seizure last night with right hip pain. EXAM: CT PELVIS WITHOUT CONTRAST TECHNIQUE: Multidetector CT imaging of the pelvis was performed following the standard protocol without intravenous contrast. COMPARISON:  Plain films 04/19/2018 and 11/05/2016 FINDINGS: Urinary Tract:  No abnormality visualized. Bowel: Moderate fecal retention over the rectum. Appendix is normal. Visualized small bowel is normal. Vascular/Lymphatic: Subtle calcified plaque at the aortic bifurcation. No adenopathy. Reproductive:  Previous hysterectomy. Other:  No free pelvic fluid. Musculoskeletal: Examination demonstrates left hip arthroplasty intact and normally located. There is a subacute to chronic fracture of the right inferior pubic ramus. There is a subtle very minimally displaced acute fracture of the anterior right acetabulum seen best on the coronal images. IMPRESSION: Subtle acute fracture of the anterior right acetabulum with subtle displacement. Subacute to chronic right inferior pubic ramus fracture. Moderate fecal retention over the rectum. Electronically Signed   By: Elberta Fortis M.D.   On: 04/19/2018 14:44   Ct Hip Right Wo Contrast  Result Date: 04/29/2018 CLINICAL DATA:  Worsening right hip pain over the past 2 days. EXAM: CT OF THE RIGHT HIP WITHOUT CONTRAST TECHNIQUE: Multidetector CT imaging of the right hip was performed according to the standard protocol. Multiplanar CT image reconstructions were also generated. COMPARISON:  None. FINDINGS: Bones/Joint/Cartilage Nondisplaced fracture of the anterior aspect of the right acetabulum not involving the articular surface. No interval change  compared with 04/19/2018. Deformity of the mid right inferior pubic ramus with mild sclerosis along the midportion likely reflecting a healing versus healed fracture. No surrounding callus formation. No aggressive osseous lesion. Normal  alignment. No joint effusion. Ligaments Ligaments are suboptimally evaluated by CT. Muscles and Tendons Muscles are normal.  No muscle atrophy. Soft tissue No fluid collection or hematoma.  No soft tissue mass. IMPRESSION: 1. Stable nondisplaced fracture along the anterior cortex of the anterior right acetabulum without articular surface involvement. 2. Deformity of the mid right inferior pubic ramus with mild sclerosis along the midportion likely reflecting a healing versus healed fracture. Electronically Signed   By: Elige Ko   On: 04/29/2018 16:13   Dg Hip Unilat W Or Wo Pelvis 2-3 Views Right  Result Date: 04/19/2018 CLINICAL DATA:  Fall last night.  Right hip pain. Initial encounter. EXAM: DG HIP (WITH OR WITHOUT PELVIS) 2-3V RIGHT COMPARISON:  None. FINDINGS: There is no evidence of hip fracture or dislocation. A nondisplaced fracture of the right inferior pubic ramus shows mild sclerosis, consistent with a healing subacute fracture. IMPRESSION: No evidence of right hip fracture or dislocation. Subacute, healing fracture of the right inferior pubic ramus. Electronically Signed   By: Myles Rosenthal M.D.   On: 04/19/2018 13:19   Vas US Carotid  Result Date: 04/20/2018 Carotid Arterial Duplex Study Indications: Syncope. Performing Technologist: Chanda Busing RVT  Examination Guidelines: A complete evaluation includes B-mode imaging, spectral Doppler, color Doppler, and power Doppler as needed of all accessible portions of each vessel. Bilateral testing is considered an integral part of a complete examination. Limited examinations for reoccurring indications may be performed as noted.  Right Carotid Findings: +----------+--------+-------+--------+----------------------+------------------+           PSV cm/sEDV    StenosisDescribe              Comments                             cm/s                                                     +----------+--------+-------+--------+----------------------+------------------+ CCA Prox  111     17                                   intimal thickening +----------+--------+-------+--------+----------------------+------------------+ CCA Distal69      18                                                      +----------+--------+-------+--------+----------------------+------------------+ ICA Prox  66      19             smooth and                                                                heterogenous                             +----------+--------+-------+--------+----------------------+------------------+  ICA Distal57      17                                                      +----------+--------+-------+--------+----------------------+------------------+ ECA       99      14                                                      +----------+--------+-------+--------+----------------------+------------------+ +----------+--------+-------+--------+-------------------+           PSV cm/sEDV cmsDescribeArm Pressure (mmHG) +----------+--------+-------+--------+-------------------+ [id number]                                         +----------+--------+-------+--------+-------------------+ +---------+--------+--+--------+-+---------+ VertebralPSV cm/s48EDV cm/s9Antegrade +---------+--------+--+--------+-+---------+  Left Carotid Findings: +----------+--------+--------+--------+-----------------------+--------+           PSV cm/sEDV cm/sStenosisDescribe               Comments +----------+--------+--------+--------+-----------------------+--------+ CCA Prox  116     25                                              +----------+--------+--------+--------+-----------------------+--------+ CCA Distal74      22              smooth and heterogenous         +----------+--------+--------+--------+-----------------------+--------+ ICA Prox   49      14              smooth and heterogenous         +----------+--------+--------+--------+-----------------------+--------+ ICA Distal57      22                                              +----------+--------+--------+--------+-----------------------+--------+ ECA       96      16                                              +----------+--------+--------+--------+-----------------------+--------+ +----------+--------+--------+--------+-------------------+ SubclavianPSV cm/sEDV cm/sDescribeArm Pressure (mmHG) +----------+--------+--------+--------+-------------------+           109                                         +----------+--------+--------+--------+-------------------+ +---------+--------+--+--------+--+---------+ VertebralPSV cm/s45EDV cm/s13Antegrade +---------+--------+--+--------+--+---------+  Summary: Right Carotid: Velocities in the right ICA are consistent with a 1-39% stenosis. Left Carotid: Velocities in the left ICA are consistent with a 1-39% stenosis. Vertebrals: Bilateral vertebral arteries demonstrate antegrade flow. *See table(s) above for measurements and observations.  Electronically signed by Sherald Hess MD on 04/20/2018 at 10:41:51 AM.    Final    Ct Maxillofacial Wo Contrast  Result Date: 04/19/2018 CLINICAL  DATA:  Seizure activity, found on floor, headache and neck pain, chin laceration EXAM: CT HEAD WITHOUT CONTRAST CT MAXILLOFACIAL WITHOUT CONTRAST CT CERVICAL SPINE WITHOUT CONTRAST TECHNIQUE: Multidetector CT imaging of the head, cervical spine, and maxillofacial structures were performed using the standard protocol without intravenous contrast. Multiplanar CT image reconstructions of the cervical spine and maxillofacial structures were also generated. COMPARISON:  11/05/2016 FINDINGS: CT HEAD FINDINGS Brain: Stable atrophy, chronic white matter microvascular change, and basal ganglia lacunar-type infarcts bilaterally. No  acute intracranial hemorrhage, new mass lesion, definite new infarction, midline shift, herniation, hydrocephalus, or extra-axial fluid collection. No focal mass effect or edema. Cisterns are patent. No cerebellar abnormality. Vascular: No hyperdense vessel or unexpected calcification. Skull: Normal. Negative for fracture or focal lesion. Other: None. CT MAXILLOFACIAL FINDINGS Osseous: No fracture or mandibular dislocation. No destructive process. Orbits: Negative. No traumatic or inflammatory finding. Sinuses: Chronic maxillary retention cysts or polyps noted. Soft tissues: No significant soft tissue asymmetry, bruising, swelling, or hematoma. All teeth have been extracted. Dentures noted. CT CERVICAL SPINE FINDINGS Alignment: Normal. Skull base and vertebrae: No acute fracture. No primary bone lesion or focal pathologic process. Soft tissues and spinal canal: No prevertebral fluid or swelling. No visible canal hematoma. Disc levels: Mild degenerative spondylosis, most pronounced at C5-6 and C6-7 with disc space narrowing, sclerosis and endplate osteophytes. Preserved vertebral body heights. Facets are aligned. No subluxation or dislocation. No acute osseous finding or fracture. Upper chest: Negative. Other: None. IMPRESSION: Atrophy and chronic white matter microvascular changes. Remote basal ganglia lacunar type infarcts. No acute intracranial abnormality by noncontrast CT. No acute facial bony trauma or fracture. Chronic maxillary retention cysts or polyps. Cervical degenerative spondylosis without acute osseous finding or malalignment. Negative for fracture. Electronically Signed   By: Judie PetitM.  Shick M.D.   On: 04/19/2018 13:40    (Echo, Carotid, EGD, Colonoscopy, ERCP)    Subjective:   Discharge Exam: Vitals:   05/01/18 2155 05/02/18 0645  BP:  120/65  Pulse:  65  Resp:  18  Temp: 98.1 F (36.7 C) 98.7 F (37.1 C)  SpO2:  98%   Vitals:   05/01/18 1337 05/01/18 2130 05/01/18 2155 05/02/18 0645   BP: 115/68 118/85  120/65  Pulse: 78 79  65  Resp: 18 18  18   Temp: 98.9 F (37.2 C)  98.1 F (36.7 C) 98.7 F (37.1 C)  TempSrc: Oral  Oral Oral  SpO2: 94% 94%  98%  Weight:      Height:        General: Pt is alert, awake, not in acute distress Cardiovascular: RRR, S1/S2 +, no rubs, no gallops Respiratory: CTA bilaterally, no wheezing, no rhonchi Abdominal: Soft, NT, ND, bowel sounds + Extremities: no edema, no cyanosis    The results of significant diagnostics from this hospitalization (including imaging, microbiology, ancillary and laboratory) are listed below for reference.     Microbiology: No results found for this or any previous visit (from the past 240 hour(s)).   Labs: BNP (last 3 results) No results for input(s): BNP in the last 8760 hours. Basic Metabolic Panel: No results for input(s): NA, K, CL, CO2, GLUCOSE, BUN, CREATININE, CALCIUM, MG, PHOS in the last 168 hours. Liver Function Tests: No results for input(s): AST, ALT, ALKPHOS, BILITOT, PROT, ALBUMIN in the last 168 hours. No results for input(s): LIPASE, AMYLASE in the last 168 hours. No results for input(s): AMMONIA in the last 168 hours. CBC: No results for input(s): WBC, NEUTROABS, HGB, HCT, MCV,  PLT in the last 168 hours. Cardiac Enzymes: No results for input(s): CKTOTAL, CKMB, CKMBINDEX, TROPONINI in the last 168 hours. BNP: Invalid input(s): POCBNP CBG: No results for input(s): GLUCAP in the last 168 hours. D-Dimer No results for input(s): DDIMER in the last 72 hours. Hgb A1c No results for input(s): HGBA1C in the last 72 hours. Lipid Profile No results for input(s): CHOL, HDL, LDLCALC, TRIG, CHOLHDL, LDLDIRECT in the last 72 hours. Thyroid function studies No results for input(s): TSH, T4TOTAL, T3FREE, THYROIDAB in the last 72 hours.  Invalid input(s): FREET3 Anemia work up No results for input(s): VITAMINB12, FOLATE, FERRITIN, TIBC, IRON, RETICCTPCT in the last 72 hours. Urinalysis     Component Value Date/Time   COLORURINE YELLOW 04/19/2018 1134   APPEARANCEUR CLEAR 04/19/2018 1134   LABSPEC 1.015 04/19/2018 1134   PHURINE 5.0 04/19/2018 1134   GLUCOSEU NEGATIVE 04/19/2018 1134   HGBUR NEGATIVE 04/19/2018 1134   BILIRUBINUR NEGATIVE 04/19/2018 1134   KETONESUR 5 (A) 04/19/2018 1134   PROTEINUR NEGATIVE 04/19/2018 1134   NITRITE NEGATIVE 04/19/2018 1134   LEUKOCYTESUR NEGATIVE 04/19/2018 1134   Sepsis Labs Invalid input(s): PROCALCITONIN,  WBC,  LACTICIDVEN Microbiology No results found for this or any previous visit (from the past 240 hour(s)).   Time coordinating discharge: 34  minutes  SIGNED:   Alwyn Ren, MD  [facility] 05/02/2018, 12:29 PM Pager   If 7PM-7AM, please contact night-coverage [email or web address] Password [email or web address]

## 2018-05-02 NOTE — Progress Notes (Signed)
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  CT pelvis showed sublet acute fracture of the anterior right acetabulum with subtle displacement.  Subacute to chronic right inferior pubic ramus fracture    PT Comments    Pt report bil hip pain (states tolerable on right and that left is now hurting from more weight bearing (previous L hip hemi and states this was, prior to admission, her "bad" leg).  Pt progressing slowly with mobility however very agreeable to PT as she wants to improve quickly.  Pt performed LE exercises upon return to supine.  Pt awaiting disposition plan.   Follow Up Recommendations  SNF     Equipment Recommendations  Rolling walker with 5" wheels    Recommendations for Other Services       Precautions / Restrictions Precautions Precautions: Fall Restrictions Weight Bearing Restrictions: Yes RLE Weight Bearing: Touchdown weight bearing    Mobility  Bed Mobility Overal bed mobility: Needs Assistance Bed Mobility: Supine to Sit;Sit to Supine     Supine to sit: Supervision Sit to supine: Supervision   General bed mobility comments: increased time and effort  Transfers Overall transfer level: Needs assistance Equipment used: Rolling walker (2 wheeled) Transfers: Sit to/from Stand Sit to Stand: Min assist         General transfer comment: Assist to rise and steady. VCs safety, technique, adherence to TDWB  Ambulation/Gait Ambulation/Gait assistance: Min assist;Min guard Gait Distance (Feet): 160 Feet Assistive device: Rolling walker (2 wheeled) Gait Pattern/deviations: Step-to pattern Gait  velocity: decr    General Gait Details: VCs safety, technique, sequence, adherence to WB status.    Stairs             Wheelchair Mobility    Modified Rankin (Stroke Patients Only)       Balance                                            Cognition Arousal/Alertness: Awake/alert Behavior During Therapy: WFL for tasks assessed/performed Overall Cognitive Status: Within Functional Limits for tasks assessed                                        Exercises General Exercises - Lower Extremity Ankle Circles/Pumps: AROM;10 reps;Both Quad Sets: AROM;10 reps;Both Gluteal Sets: AROM;10 reps;Both Short Arc Quad: AROM;10 reps;Both Heel Slides: AROM;10 reps;Both;Supine Hip ABduction/ADduction: AROM;10 reps;Supine;Both    General Comments        Pertinent Vitals/Pain Pain Assessment: 0-10 Pain Score: 8  Pain Location: R hip, with mobility  Pain Descriptors / Indicators: Discomfort;Sore Pain Intervention(s): Limited activity within patient's tolerance;Monitored during session;Repositioned    Home Living                      Prior Function            PT Goals (current goals can now be found in the care plan section) Acute Rehab PT Goals PT Goal Formulation: With patient Time For Goal Achievement: 05/16/18 Potential to Achieve Goals:  Good Progress towards PT goals: Progressing toward goals    Frequency    Min 3X/week      PT Plan Current plan remains appropriate    Co-evaluation              AM-PAC PT "6 Clicks" Mobility   Outcome Measure  Help needed turning from your back to your side while in a flat bed without using bedrails?: A Little Help needed moving from lying on your back to sitting on the side of a flat bed without using bedrails?: A Little Help needed moving to and from a bed to a chair (including a wheelchair)?: A Little Help needed standing up from a chair using your arms (e.g., wheelchair  or bedside chair)?: A Little Help needed to walk in hospital room?: A Little Help needed climbing 3-5 steps with a railing? : A Lot 6 Click Score: 17    End of Session   Activity Tolerance: Patient tolerated treatment well Patient left: with call bell/phone within reach;in bed;with bed alarm set;with SCD's reapplied   PT Visit Diagnosis: Pain;Other abnormalities of gait and mobility (R26.89);History of falling (Z91.81);Unsteadiness on feet (R26.81) Pain - Right/Left: Right Pain - part of body: Hip     Time: [id number] PT Time Calculation (min) (ACUTE ONLY): 23 min  Charges:  $Gait Training: 8-22 mins $Therapeutic Exercise: 8-22 mins                    Zenovia JarredKati Keya Wynes, PT, DPT Acute Rehabilitation Services Office: [phone] Pager: [phone]  Sarajane JewsLEMYRE,KATHrine E 05/02/2018, 1:12 PM

## 2018-05-02 NOTE — Progress Notes (Signed)
Pt discharged home via [name]. [name] voiced concerns about d/cing patient home d/t ambulatory status and deaf mother at home. This RN reviewed previous PT notes and discharge summary and verified with charge RN and [facility] that pt should be discharged home. Previous issues have been discussed with pt and MD aware that pt to be d/ced home. Pt A&Ox4 and in stable condition upon leaving unit.

## 2018-05-02 NOTE — Progress Notes (Signed)
Pt disposition remains planning to return home as she has no coverage source for SNF- reports she is progressing with therapy and "can be at home fine once I can get to the bathroom and back with my walker."   Ilean Skill, MSW, LCSW Clinical Social Work 05/02/2018 [phone]

## 2018-05-07 ENCOUNTER — Emergency Department (HOSPITAL_COMMUNITY)
Admission: EM | Admit: 2018-05-07 | Discharge: 2018-05-07 | Disposition: A | Discharge status: Home / Self Care | Payer: BLUE CROSS/BLUE SHIELD | Attending: Emergency Medicine | Admitting: Emergency Medicine

## 2018-05-07 ENCOUNTER — Emergency Department (HOSPITAL_COMMUNITY): Payer: BLUE CROSS/BLUE SHIELD

## 2018-05-07 DIAGNOSIS — Q780 Osteogenesis imperfecta: Secondary | ICD-10-CM | POA: Insufficient documentation

## 2018-05-07 DIAGNOSIS — M62838 Other muscle spasm: Secondary | ICD-10-CM | POA: Insufficient documentation

## 2018-05-07 DIAGNOSIS — Z79899 Other long term (current) drug therapy: Secondary | ICD-10-CM | POA: Insufficient documentation

## 2018-05-07 DIAGNOSIS — M25551 Pain in right hip: Secondary | ICD-10-CM | POA: Insufficient documentation

## 2018-05-07 DIAGNOSIS — Z9119 Patient's noncompliance with other medical treatment and regimen: Secondary | ICD-10-CM | POA: Insufficient documentation

## 2018-05-07 DIAGNOSIS — F17200 Nicotine dependence, unspecified, uncomplicated: Secondary | ICD-10-CM | POA: Insufficient documentation

## 2018-05-07 DIAGNOSIS — I10 Essential (primary) hypertension: Secondary | ICD-10-CM | POA: Insufficient documentation

## 2018-05-07 LAB — BASIC METABOLIC PANEL
ANION GAP: 13 (ref 5–15)
BUN: 31 mg/dL — ABNORMAL HIGH (ref 6–20)
CO2: 26 mmol/L (ref 22–32)
Calcium: 9.8 mg/dL (ref 8.9–10.3)
Chloride: 98 mmol/L (ref 98–111)
Creatinine, Ser: 0.73 mg/dL (ref 0.44–1.00)
GFR calc Af Amer: >60 mL/min (ref >60)
GFR calc non Af Amer: >60 mL/min (ref >60)
Glucose, Bld: 119 mg/dL — ABNORMAL HIGH (ref 70–99)
Potassium: 4.1 mmol/L (ref 3.5–5.1)
Sodium: 137 mmol/L (ref 135–145)

## 2018-05-07 LAB — CBC WITH DIFFERENTIAL/PLATELET
Abs Immature Granulocytes: 0.03 10*3/uL (ref 0.00–0.07)
Basophils Absolute: 0.1 10*3/uL (ref 0.0–0.1)
Basophils Relative: 1 %
Eosinophils Absolute: 0.2 10*3/uL (ref 0.0–0.5)
Eosinophils Relative: 3 %
HCT: 50.8 % — ABNORMAL HIGH (ref 36.0–46.0)
HEMOGLOBIN: 16.5 g/dL — AB (ref 12.0–15.0)
Immature Granulocytes: 0 %
Lymphocytes Relative: 21 %
Lymphs Abs: 1.5 10*3/uL (ref 0.7–4.0)
MCH: 34.1 pg — ABNORMAL HIGH (ref 26.0–34.0)
MCHC: 32.5 g/dL (ref 30.0–36.0)
MCV: 105 fL — ABNORMAL HIGH (ref 80.0–100.0)
MONO ABS: 0.8 10*3/uL (ref 0.1–1.0)
MONOS PCT: 10 %
NEUTROS ABS: 4.8 10*3/uL (ref 1.7–7.7)
NEUTROS PCT: 65 %
Platelets: 303 10*3/uL (ref 150–400)
RBC: 4.84 MIL/uL (ref 3.87–5.11)
RDW: 11.4 % — ABNORMAL LOW (ref 11.5–15.5)
WBC: 7.3 10*3/uL (ref 4.0–10.5)
nRBC: 0 % (ref 0.0–0.2)

## 2018-05-07 MED ORDER — HYDROCODONE-ACETAMINOPHEN 5-325 MG PO TABS
1.0000 | ORAL_TABLET | Freq: Once | ORAL | Status: AC
  Administered 2018-05-07: 1 via ORAL
  Filled 2018-05-07: qty 1

## 2018-05-07 MED ORDER — CYCLOBENZAPRINE HCL 5 MG PO TABS: 5.0000 mg | ORAL_TABLET | Freq: Three times a day (TID) | ORAL | 0 refills | Status: DC | PRN

## 2018-05-07 MED ORDER — CYCLOBENZAPRINE HCL 10 MG PO TABS
5.0000 mg | ORAL_TABLET | Freq: Once | ORAL | Status: AC
  Administered 2018-05-07: 5 mg via ORAL
  Filled 2018-05-07: qty 1

## 2018-05-07 NOTE — ED Notes (Addendum)
Patient transported to X-ray, will collect labs when get back

## 2018-05-07 NOTE — ED Triage Notes (Signed)
Patient here from home with complaints of right hip pain. Reports recent hospitalization for right hip fracture and states that she is having increased pain. States that her insurance ran out on the 12/31. No recent falls. Reports prescription for oxycodone but has not filled it bc " I dont have anyone to go it".

## 2018-05-07 NOTE — ED Notes (Signed)
Pt put up for discharge 1215, pt is unable to walk or move on her own. Pt family will be picking her up at 1

## 2018-05-07 NOTE — ED Notes (Signed)
Bed: [facility] Expected date:  Expected time:  Means of arrival:  Comments: Hip pain

## 2018-05-07 NOTE — ED Provider Notes (Signed)
[facility]-EMERGENCY DEPT Provider Note   CSN: [id number] Arrival date & time: 05/07/18  0700     History   Chief Complaint Chief Complaint  Patient presents with  . Hip Pain    HPI Rose Bridges is a 58 y.o. female.  The history is provided by the patient.  Hip Pain  This is a recurrent problem. The current episode started more than 1 week ago. The problem occurs daily. The problem has not changed since onset.Pertinent negatives include no chest pain, no abdominal pain, no headaches and no shortness of breath. The symptoms are aggravated by walking. Nothing relieves the symptoms. She has tried acetaminophen for the symptoms. The treatment provided mild relief.    Past Medical History:  Diagnosis Date  . Hyperlipemia   . Hypertension   . Osteogenesis imperfecta   . Osteoporosis     Patient Active Problem List   Diagnosis Date Noted  . Osteogenesis imperfecta 04/28/2018  . Accidental fall from bed   . Acetabular fracture (HCC) 04/19/2018  . Seizure (HCC) 03/26/2017  . Confusion 12/19/2016  . Fall 12/19/2016  . Closed fracture of right proximal humerus 11/07/2016  . Loss of consciousness (HCC) 11/05/2016  . Hip fracture (HCC) 09/25/2015  . Fall from slip, trip, or stumble   . Left displaced femoral neck fracture [facility])     Past Surgical History:  Procedure Laterality Date  . ABDOMINAL HYSTERECTOMY    . HIP ARTHROPLASTY Left 09/26/2015   Procedure: ARTHROPLASTY BIPOLAR HIP (HEMIARTHROPLASTY);  Surgeon: Sheral Apley, MD;  Location: [facility] OR;  Service: Orthopedics;  Laterality: Left;  . ORIF HUMERUS FRACTURE Right 11/06/2016   Procedure: OPEN REDUCTION INTERNAL FIXATION (ORIF) PROXIMAL HUMERUS FRACTURE;  Surgeon: Sheral Apley, MD;  Location: [facility] OR;  Service: Orthopedics;  Laterality: Right;     OB History   No obstetric history on file.      Home Medications    Prior to Admission medications   Medication Sig Start Date End Date  Taking? Authorizing Provider  bisacodyl (DULCOLAX) 5 MG EC tablet Take 1 tablet (5 mg total) by mouth daily as needed for moderate constipation. 04/21/18   Regalado, Belkys A, MD  cyclobenzaprine (FLEXERIL) 5 MG tablet Take 1 tablet (5 mg total) by mouth 3 (three) times daily as needed for up to 15 doses for muscle spasms. 05/07/18   Lakevia Perris, DO  folic acid (FOLVITE) 1 MG tablet Take 1 tablet (1 mg total) by mouth daily. Patient not taking: Reported on 11/12/2017 01/31/17   Levert Feinstein, MD  hydrALAZINE (APRESOLINE) 25 MG tablet Take 1 tablet (25 mg total) by mouth 2 (two) times daily. 05/02/18   Alwyn Ren, MD  levETIRAcetam (KEPPRA) 500 MG tablet Take 1 tablet (500 mg total) by mouth 2 (two) times daily. 04/21/18   Regalado, Belkys A, MD  methocarbamol (ROBAXIN) 500 MG tablet Take 1 tablet (500 mg total) by mouth every 8 (eight) hours as needed for muscle spasms. 04/21/18   Regalado, Jon Billings A, MD  Multiple Vitamin (MULTIVITAMIN WITH MINERALS) TABS tablet Take 1 tablet by mouth daily. 04/22/18   Regalado, Belkys A, MD  oxyCODONE (OXY IR/ROXICODONE) 5 MG immediate release tablet Take 1 tablet (5 mg total) by mouth every 6 (six) hours as needed for moderate pain. 05/02/18   Alwyn Ren, MD  polyethylene glycol [facility]) packet Take 17 g by mouth daily. 04/22/18   Regalado, Belkys A, MD  senna-docusate (SENOKOT-S) 8.6-50 MG tablet Take  1 tablet by mouth 2 (two) times daily. 04/21/18   Regalado, Belkys A, MD  thiamine 100 MG tablet Take 1 tablet (100 mg total) by mouth daily. 04/22/18   Regalado, Prentiss Bells, MD    Family History Family History  Problem Relation Age of Onset  . Stroke Mother   . Heart attack Mother   . Hypertension Mother   . Stroke Sister   . Stroke Brother   . Lung cancer Father     Social History Social History   Tobacco Use  . Smoking status: Current Every Day Smoker    Packs/day: 1.00  . Smokeless tobacco: Never Used  Substance Use Topics    . Alcohol use: No    Alcohol/week: 85.0 standard drinks    Types: 85 Shots of liquor per week    Comment: No alcohol use since 11/04/16.  . Drug use: No     Allergies   Codeine   Review of Systems Review of Systems  Constitutional: Negative for chills and fever.  HENT: Negative for ear pain and sore throat.   Eyes: Negative for pain and visual disturbance.  Respiratory: Negative for cough and shortness of breath.   Cardiovascular: Negative for chest pain and palpitations.  Gastrointestinal: Negative for abdominal pain and vomiting.  Genitourinary: Negative for dysuria and hematuria.  Musculoskeletal: Positive for arthralgias and gait problem. Negative for back pain.  Skin: Negative for color change and rash.  Neurological: Negative for seizures, syncope and headaches.  All other systems reviewed and are negative.    Physical Exam Updated Vital Signs  ED Triage Vitals  Enc Vitals Group     BP 05/07/18 0729 (!) 149/88     Pulse Rate 05/07/18 0729 74     Resp 05/07/18 0729 18     Temp 05/07/18 0729 (!) 97.5 F (36.4 C)     Temp Source 05/07/18 0729 Oral     SpO2 05/07/18 0729 98 %     Weight --      Height --      Head Circumference --      Peak Flow --      Pain Score 05/07/18 0729 10     Pain Loc --      Pain Edu? --      Excl. in GC? --     Physical Exam Vitals signs and nursing note reviewed.  Constitutional:      General: She is not in acute distress.    Appearance: She is well-developed.  HENT:     Head: Normocephalic and atraumatic.     Nose: Nose normal.     Mouth/Throat:     Mouth: Mucous membranes are moist.  Eyes:     Conjunctiva/sclera: Conjunctivae normal.     Pupils: Pupils are equal, round, and reactive to light.  Neck:     Musculoskeletal: Neck supple.  Cardiovascular:     Rate and Rhythm: Normal rate and regular rhythm.     Pulses: Normal pulses.     Heart sounds: Normal heart sounds. No murmur.  Pulmonary:     Effort: Pulmonary  effort is normal. No respiratory distress.     Breath sounds: Normal breath sounds.  Abdominal:     Palpations: Abdomen is soft.     Tenderness: There is no abdominal tenderness.  Musculoskeletal: Normal range of motion.        General: Tenderness present. No swelling.     Right lower leg: No edema.  Left lower leg: No edema.     Comments: Right hip area and with ROM  Skin:    General: Skin is warm and dry.  Neurological:     General: No focal deficit present.     Mental Status: She is alert and oriented to person, place, and time.     Cranial Nerves: No cranial nerve deficit.     Sensory: No sensory deficit.     Motor: No weakness.     Coordination: Coordination normal.     Comments: 5+/5 strength, normal sensation  Psychiatric:        Mood and Affect: Mood normal.      ED Treatments / Results  Labs (all labs ordered are listed, but only abnormal results are displayed) Labs Reviewed  CBC WITH DIFFERENTIAL/PLATELET - Abnormal; Notable for the following components:      Result Value   Hemoglobin 16.5 (*)    HCT 50.8 (*)    MCV 105.0 (*)    MCH 34.1 (*)    RDW 11.4 (*)    All other components within normal limits  BASIC METABOLIC PANEL - Abnormal; Notable for the following components:   Glucose, Bld 119 (*)    BUN 31 (*)    All other components within normal limits    EKG None  Radiology Dg Hip Unilat With Pelvis 2-3 Views Right  Result Date: 05/07/2018 CLINICAL DATA:  Pain.  Recent known fracture of right ischium EXAM: DG HIP (WITH OR WITHOUT PELVIS) 2-3V RIGHT COMPARISON:  April 19, 2018 FINDINGS: Frontal pelvis as well as frontal and lateral right hip images were obtained. There is a total hip replacement on the left with prosthetic components well-seated. There is a fracture of the medial aspect of the right ischium with alignment essentially anatomic, stable. No new fracture is demonstrable. No dislocation. Right hip joint appears unremarkable. No erosive  changes evident. IMPRESSION: Status post total hip replacement on the left with prosthetic component well-seated. Stable fairly recent/subacute fracture medial right ischium with alignment near anatomic and stable. No new fracture. No dislocation. Other joint spaces appear unremarkable. Electronically Signed   By: Bretta BangWilliam  Woodruff III M.D.   On: 05/07/2018 08:29    Procedures Procedures (including critical care time)  Medications Ordered in ED Medications  HYDROcodone-acetaminophen (NORCO/VICODIN) 5-325 MG per tablet 1 tablet (1 tablet Oral Given 05/07/18 0744)  cyclobenzaprine (FLEXERIL) tablet 5 mg (5 mg Oral Given 05/07/18 0744)     Initial Impression / Assessment and Plan / ED Course  I have reviewed the triage vital signs and the nursing notes.  Pertinent labs & imaging results that were available during my care of the patient were reviewed by me and considered in my medical decision making (see chart for details).     Vilinda Flakelizabeth Lerman is a 58 year old female with history of high cholesterol, hypertension, recent right hip injury who presents with ongoing right hip pain.  Patient with normal vitals.  No fever.  Patient denies any new falls.  Patient with recent admission to the hospital due to right pelvic fracture.  Has been using a walker at home.  Had pain prescription given to her that she has been unable to fill.  She has had increased spasms.  Patient did not qualify for any home health.  Will confirm with social work.  Patient with x-ray that showed no acute fractures.  There is stable alignment and fracture to the right ischium.  Otherwise lab work unremarkable.  Social work was  initiated and confirmed that patient did not qualify for any home health aide.  Patient does state that she has been able to get around with her walker at home but has had issues with pain and was not able to fill her pain medicine.  Patient was given Norco and Flexeril here while in the ED.  Social work will  help to make sure that patient is able to afford her medications.  Patient was discharged in the ED in good condition.  This chart was dictated using voice recognition software.  Despite best efforts to proofread,  errors can occur which can change the documentation meaning.   Final Clinical Impressions(s) / ED Diagnoses   Final diagnoses:  Right hip pain    ED Discharge Orders         Ordered    cyclobenzaprine (FLEXERIL) 5 MG tablet  3 times daily PRN     05/07/18 1215           Virgina NorfolkCuratolo, Chauntae Hults, DO 05/07/18 1216

## 2018-05-07 NOTE — Progress Notes (Signed)
CSW aware of consult for Home Health options. CSW aware patient was inpatient from 12/21-1/03. Per notes, patient had improved and was walking 160 feet at discharge. Patient unable to go to SNF due to not having a payor source. CSW spoke to patient about Home Health to which patient stated she could not afford the copay days as she has no insurance. Per patient, she lives with her elderly mother who has her own health concerns and cannot help with patient's care.   CSW spoke with Chiropodist who advised getting patient up and walking around. CSW to follow up with EDP and RN.  Archie Balboa, LCSWA  Clinical Social Work Department  [employer]  [phone]

## 2019-05-28 ENCOUNTER — Other Ambulatory Visit: Payer: Self-pay

## 2019-05-28 ENCOUNTER — Ambulatory Visit (HOSPITAL_BASED_OUTPATIENT_CLINIC_OR_DEPARTMENT_OTHER): Payer: Self-pay | Admitting: Pharmacist

## 2019-05-28 ENCOUNTER — Ambulatory Visit: Payer: Self-pay | Attending: Family Medicine | Admitting: Family Medicine

## 2019-05-28 ENCOUNTER — Encounter: Payer: Self-pay | Admitting: Family Medicine

## 2019-05-28 VITALS — BP 96/63 | HR 86 | Temp 97.2°F | Ht 64.0 in | Wt 127.0 lb

## 2019-05-28 DIAGNOSIS — Q780 Osteogenesis imperfecta: Secondary | ICD-10-CM

## 2019-05-28 DIAGNOSIS — G40909 Epilepsy, unspecified, not intractable, without status epilepticus: Secondary | ICD-10-CM

## 2019-05-28 DIAGNOSIS — M25551 Pain in right hip: Secondary | ICD-10-CM

## 2019-05-28 DIAGNOSIS — R5383 Other fatigue: Secondary | ICD-10-CM

## 2019-05-28 DIAGNOSIS — M25552 Pain in left hip: Secondary | ICD-10-CM

## 2019-05-28 DIAGNOSIS — Z23 Encounter for immunization: Secondary | ICD-10-CM

## 2019-05-28 DIAGNOSIS — Z8781 Personal history of (healed) traumatic fracture: Secondary | ICD-10-CM

## 2019-05-28 DIAGNOSIS — G8929 Other chronic pain: Secondary | ICD-10-CM

## 2019-05-28 DIAGNOSIS — M810 Age-related osteoporosis without current pathological fracture: Secondary | ICD-10-CM

## 2019-05-28 MED ORDER — THIAMINE HCL 100 MG PO TABS: 100.0000 mg | ORAL_TABLET | Freq: Every day | ORAL | 1 refills | Status: DC

## 2019-05-28 MED ORDER — METHOCARBAMOL 500 MG PO TABS: 500.0000 mg | ORAL_TABLET | Freq: Three times a day (TID) | ORAL | 4 refills | Status: DC | PRN

## 2019-05-28 MED ORDER — FOLIC ACID 1 MG PO TABS: 1.0000 mg | ORAL_TABLET | Freq: Every day | ORAL | 3 refills | Status: DC

## 2019-05-28 MED ORDER — LEVETIRACETAM 500 MG PO TABS: 500.0000 mg | ORAL_TABLET | Freq: Two times a day (BID) | ORAL | 1 refills | Status: DC

## 2019-05-28 MED FILL — FOLIC ACID 1 MG TABS: 1 | 30 days supply | Qty: 30 | Fill #0

## 2019-05-28 MED FILL — levETIRAcetam 500 MG TABS: 500 | 30 days supply | Qty: 60 | Fill #0

## 2019-05-28 MED FILL — METHOCARBAMOL 500 MG TABS: 500 | 10 days supply | Qty: 30 | Fill #0

## 2019-05-28 NOTE — Progress Notes (Signed)
Subjective:  Patient ID: Rose Bridges, female    DOB: 05-11-1960  Age: 59 y.o. MRN: [id number]  CC: New Patient (Initial Visit)   HPI Rose Bridges, 59 yo female, who presents to establish care.  She reports medical issues including osteogenesis imperfecta, history of left hip fracture and right humeral fracture and she additionally has osteoporosis and seizure disorder.  She reports no recent seizure activity but does need refill of medication for prevention of seizures.  She reports that she currently has issues with chronic bilateral hip pain.  She reports her last hospitalization occurred after a fall at home resulting in pelvic fracture in December 2019.Marland Kitchen  She continues to have pain with walking or prolonged sitting.  Pain is always around a 6 on a 0-to-10 scale and is worse with activity.  She also reports having osteoporosis but did not tolerate oral medication for treatment.  Past Medical History:  Diagnosis Date  . Hyperlipemia   . Hypertension   . Osteogenesis imperfecta   . Osteoporosis     Past Surgical History:  Procedure Laterality Date  . ABDOMINAL HYSTERECTOMY    . HIP ARTHROPLASTY Left 09/26/2015   Procedure: ARTHROPLASTY BIPOLAR HIP (HEMIARTHROPLASTY);  Surgeon: Sheral Apley, MD;  Location: [facility] OR;  Service: Orthopedics;  Laterality: Left;  . ORIF HUMERUS FRACTURE Right 11/06/2016   Procedure: OPEN REDUCTION INTERNAL FIXATION (ORIF) PROXIMAL HUMERUS FRACTURE;  Surgeon: Sheral Apley, MD;  Location: [facility] OR;  Service: Orthopedics;  Laterality: Right;    Family History  Problem Relation Age of Onset  . Stroke Mother   . Heart attack Mother   . Hypertension Mother   . Stroke Sister   . Stroke Brother   . Lung cancer Father     Social History   Tobacco Use  . Smoking status: Current Every Day Smoker    Packs/day: 1.00  . Smokeless tobacco: Never Used  Substance Use Topics  . Alcohol use: No    Alcohol/week: 85.0 standard drinks    Types: 85  Shots of liquor per week    Comment: No alcohol use since 11/04/16.    ROS Review of Systems  Constitutional: Positive for fatigue. Negative for chills and fever.  HENT: Negative for sore throat and trouble swallowing.   Eyes: Negative for photophobia and visual disturbance.  Respiratory: Negative for cough and shortness of breath.   Cardiovascular: Negative for chest pain and palpitations.  Gastrointestinal: Negative for abdominal pain, blood in stool, constipation, diarrhea and nausea.  Endocrine: Positive for cold intolerance. Negative for heat intolerance, polydipsia, polyphagia and polyuria.  Genitourinary: Negative for dysuria and frequency.  Musculoskeletal: Positive for arthralgias, back pain and gait problem.  Neurological: Negative for dizziness and headaches.  Hematological: Negative for adenopathy. Does not bruise/bleed easily.    Objective:   Today's Vitals: BP 96/63 (BP Location: Right Arm, Patient Position: Sitting, Cuff Size: Normal)   Pulse 86   Temp (!) 97.2 F (36.2 C) (Oral)   Ht 5\' 4"  (1.626 m)   Wt 127 lb (57.6 kg)   SpO2 97%   BMI 21.80 kg/m   Physical Exam Vitals and nursing note reviewed.  Constitutional:      General: She is not in acute distress.    Appearance: Normal appearance.  Neck:     Vascular: No carotid bruit.  Cardiovascular:     Rate and Rhythm: Normal rate and regular rhythm.  Pulmonary:     Effort: Pulmonary effort is normal.  Breath sounds: Normal breath sounds.  Abdominal:     Palpations: Abdomen is soft.     Tenderness: There is no abdominal tenderness. There is no right CVA tenderness, left CVA tenderness, guarding or rebound.  Musculoskeletal:        General: Tenderness (Bilateral hips) present.     Cervical back: Neck supple. No tenderness.     Right lower leg: No edema.     Left lower leg: No edema.  Lymphadenopathy:     Cervical: No cervical adenopathy.  Skin:    General: Skin is warm and dry.  Neurological:      General: No focal deficit present.     Mental Status: She is alert and oriented to person, place, and time.  Psychiatric:        Mood and Affect: Mood normal.        Behavior: Behavior normal.     Assessment & Plan:  1. Chronic pain of both hips Patient with complaint of chronic bilateral hip pain for which she has been referred to orthopedics for further evaluation and treatment.  Refill provided of Robaxin to help with hip discomfort. - methocarbamol (ROBAXIN) 500 MG tablet; Take 1 tablet (500 mg total) by mouth every 8 (eight) hours as needed for muscle spasms.  Dispense: 30 tablet; Refill: 4 - Ambulatory referral to Orthopedic Surgery  2. Osteogenesis imperfecta Patient with osteogenesis imperfecta and has had multiple secondary fractures.  On review of chart, she has had right humeral fracture in 2018, left hip fracture in 2017.  She has been referred to orthopedic surgery and rheumatology in follow-up of her bone disorder.  Refills provided of thiamine and folic acid.  Will additionally check CBC and TSH due to patient's complaints of fatigue.  She is encouraged to apply for [facility] discount program to help with ongoing cost of medical care. - CBC - thiamine 100 MG tablet; Take 1 tablet (100 mg total) by mouth daily. (Patient not taking: Reported on 07/02/2019)  Dispense: 90 tablet; Refill: 1 - folic acid (FOLVITE) 1 MG tablet; Take 1 tablet (1 mg total) by mouth daily.  Dispense: 90 tablet; Refill: 3 - Ambulatory referral to Orthopedic Surgery - Ambulatory referral to Rheumatology  3. History of hip fracture Referral being placed to orthopedics due to patient's history of hip fracture, chronic bilateral hip pain and osteogenesis imperfecta - Ambulatory referral to Orthopedic Surgery  4. Seizure disorder ([place]) Refill provided for Keppra for continued treatment of seizure disorder.  Patient will be referred to establish care with neurology.  Will obtain comprehensive metabolic panel in  follow-up of use of medication for the treatment of seizure disorder. - Comprehensive metabolic panel - Thyroid Panel With TSH - levETIRAcetam (KEPPRA) 500 MG tablet; Take 1 tablet (500 mg total) by mouth 2 (two) times daily.  Dispense: 180 tablet; Refill: 1 - Ambulatory referral to Neurology  5. Osteoporosis without current pathological fracture, unspecified osteoporosis type Patient with history of osteoporosis.  Will check vitamin D level.  She will be referred to rheumatology for possible infusion therapy as she states that she has not tolerated oral bisphosphonate therapy in the past. - Vitamin D, 25-hydroxy - Ambulatory referral to Rheumatology  6.  Fatigue Will check comprehensive metabolic panel, thyroid panel and CBC in follow-up of patient's plaints of fatigue as well as long-term use of medication for seizure disorder and chronic pain.  A review of past medical records, patient with prior issues with alcohol dependence but she reports that she  no longer drinks alcohol.  Outpatient Encounter Medications as of 05/28/2019  Medication Sig  . folic acid (FOLVITE) 1 MG tablet Take 1 tablet (1 mg total) by mouth daily.  . hydrALAZINE (APRESOLINE) 25 MG tablet Take 1 tablet (25 mg total) by mouth 2 (two) times daily.  Marland Kitchen levETIRAcetam (KEPPRA) 500 MG tablet Take 1 tablet (500 mg total) by mouth 2 (two) times daily.  Marland Kitchen thiamine 100 MG tablet Take 1 tablet (100 mg total) by mouth daily. (Patient not taking: Reported on 07/02/2019)  . [DISCONTINUED] folic acid (FOLVITE) 1 MG tablet Take 1 tablet (1 mg total) by mouth daily.  . [DISCONTINUED] levETIRAcetam (KEPPRA) 500 MG tablet Take 1 tablet (500 mg total) by mouth 2 (two) times daily.  . [DISCONTINUED] thiamine 100 MG tablet Take 1 tablet (100 mg total) by mouth daily.  . bisacodyl (DULCOLAX) 5 MG EC tablet Take 1 tablet (5 mg total) by mouth daily as needed for moderate constipation. (Patient not taking: Reported on 05/28/2019)  . methocarbamol  (ROBAXIN) 500 MG tablet Take 1 tablet (500 mg total) by mouth every 8 (eight) hours as needed for muscle spasms.  . Multiple Vitamin (MULTIVITAMIN WITH MINERALS) TABS tablet Take 1 tablet by mouth daily. (Patient not taking: Reported on 05/28/2019)  . polyethylene glycol (MIRALAX / GLYCOLAX) packet Take 17 g by mouth daily. (Patient not taking: Reported on 05/28/2019)  . senna-docusate (SENOKOT-S) 8.6-50 MG tablet Take 1 tablet by mouth 2 (two) times daily.  . [DISCONTINUED] cyclobenzaprine (FLEXERIL) 5 MG tablet Take 1 tablet (5 mg total) by mouth 3 (three) times daily as needed for up to 15 doses for muscle spasms. (Patient not taking: Reported on 05/28/2019)  . [DISCONTINUED] methocarbamol (ROBAXIN) 500 MG tablet Take 1 tablet (500 mg total) by mouth every 8 (eight) hours as needed for muscle spasms. (Patient not taking: Reported on 05/28/2019)  . [DISCONTINUED] oxyCODONE (OXY IR/ROXICODONE) 5 MG immediate release tablet Take 1 tablet (5 mg total) by mouth every 6 (six) hours as needed for moderate pain. (Patient not taking: Reported on 05/28/2019)   No facility-administered encounter medications on file as of 05/28/2019.    An After Visit Summary was printed and given to the patient.   Follow-up: Return in about 12 weeks (around 08/20/2019) for chroinc issues.    Cain Saupe MD

## 2019-05-28 NOTE — Progress Notes (Signed)
Patient presents for vaccination against influenza per orders of Dr. Fulp. Consent given. Counseling provided. No contraindications exists. Vaccine administered without incident.   

## 2019-05-28 NOTE — Progress Notes (Signed)
Pt. Is here to establish care.  

## 2019-05-28 NOTE — Patient Instructions (Signed)
Osteoporosis  Osteoporosis happens when your bones get thin and weak. This can cause your bones to break (fracture) more easily. You can do things at home to make your bones stronger. Follow these instructions at home:  Activity  Exercise as told by your doctor. Ask your doctor what activities are safe for you. You should do: ? Exercises that make your muscles work to hold your body weight up (weight-bearing exercises). These include tai chi, yoga, and walking. ? Exercises to make your muscles stronger. One example is lifting weights. Lifestyle  Limit alcohol intake to no more than 1 drink a day for nonpregnant women and 2 drinks a day for men. One drink equals 12 oz of beer, 5 oz of wine, or 1 oz of hard liquor.  Do not use any products that have nicotine or tobacco in them. These include cigarettes and e-cigarettes. If you need help quitting, ask your doctor. Preventing falls  Use tools to help you move around (mobility aids) as needed. These include canes, walkers, scooters, and crutches.  Keep rooms well-lit and free of clutter.  Put away things that could make you trip. These include cords and rugs.  Install safety rails on stairs. Install grab bars in bathrooms.  Use rubber mats in slippery areas, like bathrooms.  Wear shoes that: ? Fit you well. ? Support your feet. ? Have closed toes. ? Have rubber soles or low heels.  Tell your doctor about all of the medicines you are taking. Some medicines can make you more likely to fall. General instructions  Eat plenty of calcium and vitamin D. These nutrients are good for your bones. Good sources of calcium and vitamin D include: ? Some fatty fish, such as salmon and tuna. ? Foods that have calcium and vitamin D added to them (fortified foods). For example, some breakfast cereals are fortified with calcium and vitamin D. ? Egg yolks. ? Cheese. ? Liver.  Take over-the-counter and prescription medicines only as told by your  doctor.  Keep all follow-up visits as told by your doctor. This is important. Contact a doctor if:  You have not been tested (screened) for osteoporosis and you are: ? A woman who is age 65 or older. ? A man who is age 70 or older. Get help right away if:  You fall.  You get hurt. Summary  Osteoporosis happens when your bones get thin and weak.  Weak bones can break (fracture) more easily.  Eat plenty of calcium and vitamin D. These nutrients are good for your bones.  Tell your doctor about all of the medicines that you take. This information is not intended to replace advice given to you by your health care provider. Make sure you discuss any questions you have with your health care provider. Document Revised: 03/29/2017 Document Reviewed: 02/08/2017 Elsevier Patient Education  2020 Elsevier Inc.  

## 2019-05-29 ENCOUNTER — Other Ambulatory Visit: Payer: Self-pay | Admitting: Family Medicine

## 2019-05-29 DIAGNOSIS — D751 Secondary polycythemia: Secondary | ICD-10-CM

## 2019-05-29 LAB — COMPREHENSIVE METABOLIC PANEL WITH GFR
ALT: 18 IU/L (ref 0–32)
AST: 29 IU/L (ref 0–40)
Albumin/Globulin Ratio: 1.6 (ref 1.2–2.2)
Albumin: 4.7 g/dL (ref 3.8–4.9)
Alkaline Phosphatase: 136 IU/L — ABNORMAL HIGH (ref 39–117)
BUN/Creatinine Ratio: 15 (ref 12–28)
BUN: 12 mg/dL (ref 8–27)
Bilirubin Total: 0.4 mg/dL (ref 0.0–1.2)
CO2: 24 mmol/L (ref 20–29)
Calcium: 10.1 mg/dL (ref 8.7–10.3)
Chloride: 103 mmol/L (ref 96–106)
Creatinine, Ser: 0.78 mg/dL (ref 0.57–1.00)
GFR calc Af Amer: 96 mL/min/1.73
GFR calc non Af Amer: 83 mL/min/1.73
Globulin, Total: 3 g/dL (ref 1.5–4.5)
Glucose: 98 mg/dL (ref 65–99)
Potassium: 4.7 mmol/L (ref 3.5–5.2)
Sodium: 145 mmol/L — ABNORMAL HIGH (ref 134–144)
Total Protein: 7.7 g/dL (ref 6.0–8.5)

## 2019-05-29 LAB — THYROID PANEL WITH TSH
Free Thyroxine Index: 2 (ref 1.2–4.9)
T3 Uptake Ratio: 23 % — ABNORMAL LOW (ref 24–39)
T4, Total: 8.7 ug/dL (ref 4.5–12.0)
TSH: 1.19 u[IU]/mL (ref 0.450–4.500)

## 2019-05-29 LAB — CBC
Hematocrit: 54 % — ABNORMAL HIGH (ref 34.0–46.6)
Hemoglobin: 17.7 g/dL — ABNORMAL HIGH (ref 11.1–15.9)
MCH: 30.2 pg (ref 26.6–33.0)
MCHC: 32.8 g/dL (ref 31.5–35.7)
MCV: 92 fL (ref 79–97)
Platelets: 229 x10E3/uL (ref 150–450)
RBC: 5.87 x10E6/uL — ABNORMAL HIGH (ref 3.77–5.28)
RDW: 12.9 % (ref 11.7–15.4)
WBC: 8.9 x10E3/uL (ref 3.4–10.8)

## 2019-05-29 LAB — VITAMIN D 25 HYDROXY (VIT D DEFICIENCY, FRACTURES): Vit D, 25-Hydroxy: 35.4 ng/mL (ref 30.0–100.0)

## 2019-05-29 NOTE — Progress Notes (Signed)
Patient ID: Rose Bridges, female   DOB: [birth year], 60 y.o.   MRN: [id number]   Patient with elevated hemoglobin on CBC and on prior labs therefore will refer to hematology

## 2019-06-02 ENCOUNTER — Encounter: Payer: Self-pay | Admitting: Neurology

## 2019-06-17 ENCOUNTER — Ambulatory Visit: Payer: Self-pay

## 2019-06-24 ENCOUNTER — Encounter: Payer: Self-pay | Admitting: Family Medicine

## 2019-06-24 ENCOUNTER — Telehealth: Payer: Self-pay | Admitting: Hematology

## 2019-06-24 NOTE — Telephone Encounter (Signed)
Received a new hem referral from Dr. Jillyn Hidden for polycythemia. Rose Bridges has been scheduled to see Dr. Candise Che on 3/4 at 1pm. Appt date and time has been given to the pt's son.

## 2019-06-26 MED FILL — FOLIC ACID 1 MG TABS: 1 | 30 days supply | Qty: 30 | Fill #1

## 2019-06-26 MED FILL — levETIRAcetam 500 MG TABS: 500 | 30 days supply | Qty: 60 | Fill #1

## 2019-06-26 MED FILL — METHOCARBAMOL 500 MG TABS: 500 | 10 days supply | Qty: 30 | Fill #1

## 2019-07-01 NOTE — Progress Notes (Signed)
HEMATOLOGY/ONCOLOGY CONSULTATION NOTE  Date of Service: 07/01/2019  Patient Care Team: Cain Saupe, MD as PCP - General (Family Medicine)  REFERRING PHYSICIAN: Cain Saupe, MD  CHIEF COMPLAINTS/PURPOSE OF CONSULTATION:  Polycythemia    HISTORY OF PRESENTING ILLNESS:   Rose Bridges is a wonderful 59 y.o. female who has been referred to [place] by Cain Saupe, MD for evaluation and management of Polycythemia. Pt is accompanied today by her son. The pt reports that she is doing well overall.   The pt reports she has osteogenesis imperfecta. Due to the osteogenesis imperfecta she no longer had teeth and in most of her mandible. She has had trouble eating. She also have had several broken bones.   Pt is currently taking Keppra for her epilepsy. She is not currently seeing neurologist because she does not have insurance. Pt has high blood pressure, and high cholesterol.   She has not been heavily drinking since December of 2019, but is smoking 4-5 cigarettes a day. She is currently sleeping 4-5 hours a day. Pt reports that she has been drinking mostly soda and not a lot of water.   Of note prior to the patient's visit today, pt has had CBC completed on 05/28/2019 with results revealing All values WNL except: RBC at 5.87, Hemoglobin at 17.7, Hematocrit at 54, Sodium at 145, Alkaline Phosphatase at 136.  05/28/2019 Thyroid Panel with TSH: all values WNL except: T3 Uptake Ratio at 23  On review of systems, pt reports trouble eating, increased bowl movements and denies blood clots, heart problems, strokes and any other symptoms.   On PMHx the pt reports Osteogenesis imperfecta, epilepsy, high blood pressure, high cholesterol    On Social Hx the pt reports smoking, drinking, currently not working    MEDICAL HISTORY:  Past Medical History:  Diagnosis Date  . Hyperlipemia   . Hypertension   . Osteogenesis imperfecta   . Osteoporosis      SURGICAL HISTORY: Past Surgical History:   Procedure Laterality Date  . ABDOMINAL HYSTERECTOMY    . HIP ARTHROPLASTY Left 09/26/2015   Procedure: ARTHROPLASTY BIPOLAR HIP (HEMIARTHROPLASTY);  Surgeon: Sheral Apley, MD;  Location: [facility] OR;  Service: Orthopedics;  Laterality: Left;  . ORIF HUMERUS FRACTURE Right 11/06/2016   Procedure: OPEN REDUCTION INTERNAL FIXATION (ORIF) PROXIMAL HUMERUS FRACTURE;  Surgeon: Sheral Apley, MD;  Location: [facility] OR;  Service: Orthopedics;  Laterality: Right;     SOCIAL HISTORY: Social History   Socioeconomic History  . Marital status: Divorced    Spouse name: Not on file  . Number of children: 1  . Years of education: College  . Highest education level: Not on file  Occupational History  . Occupation: Film/video editor  Tobacco Use  . Smoking status: Current Every Day Smoker    Packs/day: 1.00  . Smokeless tobacco: Never Used  Substance and Sexual Activity  . Alcohol use: No    Alcohol/week: 85.0 standard drinks    Types: 85 Shots of liquor per week    Comment: No alcohol use since 11/04/16.  . Drug use: No  . Sexual activity: Not Currently  Other Topics Concern  . Not on file  Social History Narrative   Pt lives with her mother.   Right-handed.   1-2 bottles (16Oz each) of [facility] or Dr. Reino Kent.   Occasionally drinks Monster drinks.   Social Determinants of Health   Financial Resource Strain:   . Difficulty of Paying Living Expenses: Not on file  Food  Insecurity:   . Worried About Charity fundraiser in the Last Year: Not on file  . Ran Out of Food in the Last Year: Not on file  Transportation Needs:   . Lack of Transportation (Medical): Not on file  . Lack of Transportation (Non-Medical): Not on file  Physical Activity:   . Days of Exercise per Week: Not on file  . Minutes of Exercise per Session: Not on file  Stress:   . Feeling of Stress : Not on file  Social Connections:   . Frequency of Communication with Friends and Family: Not on file  . Frequency of Social  Gatherings with Friends and Family: Not on file  . Attends Religious Services: Not on file  . Active Member of Clubs or Organizations: Not on file  . Attends Archivist Meetings: Not on file  . Marital Status: Not on file  Intimate Partner Violence:   . Fear of Current or Ex-Partner: Not on file  . Emotionally Abused: Not on file  . Physically Abused: Not on file  . Sexually Abused: Not on file     FAMILY HISTORY: Family History  Problem Relation Age of Onset  . Stroke Mother   . Heart attack Mother   . Hypertension Mother   . Stroke Sister   . Stroke Brother   . Lung cancer Father      ALLERGIES:   is allergic to codeine.   MEDICATIONS:  Current Outpatient Medications  Medication Sig Dispense Refill  . bisacodyl (DULCOLAX) 5 MG EC tablet Take 1 tablet (5 mg total) by mouth daily as needed for moderate constipation. (Patient not taking: Reported on 05/28/2019) 30 tablet 0  . folic acid (FOLVITE) 1 MG tablet Take 1 tablet (1 mg total) by mouth daily. 90 tablet 3  . hydrALAZINE (APRESOLINE) 25 MG tablet Take 1 tablet (25 mg total) by mouth 2 (two) times daily. 60 tablet 0  . levETIRAcetam (KEPPRA) 500 MG tablet Take 1 tablet (500 mg total) by mouth 2 (two) times daily. 180 tablet 1  . methocarbamol (ROBAXIN) 500 MG tablet Take 1 tablet (500 mg total) by mouth every 8 (eight) hours as needed for muscle spasms. 30 tablet 4  . Multiple Vitamin (MULTIVITAMIN WITH MINERALS) TABS tablet Take 1 tablet by mouth daily. (Patient not taking: Reported on 05/28/2019) 30 tablet 0  . oxyCODONE (OXY IR/ROXICODONE) 5 MG immediate release tablet Take 1 tablet (5 mg total) by mouth every 6 (six) hours as needed for moderate pain. (Patient not taking: Reported on 05/28/2019) 30 tablet 0  . polyethylene glycol (MIRALAX / GLYCOLAX) packet Take 17 g by mouth daily. (Patient not taking: Reported on 05/28/2019) 14 each 0  . senna-docusate (SENOKOT-S) 8.6-50 MG tablet Take 1 tablet by mouth 2 (two)  times daily. (Patient not taking: Reported on 05/28/2019) 30 tablet 0  . thiamine 100 MG tablet Take 1 tablet (100 mg total) by mouth daily. 90 tablet 1   No current facility-administered medications for this visit.     REVIEW OF SYSTEMS:   A 10+ POINT REVIEW OF SYSTEMS WAS OBTAINED including neurology, dermatology, psychiatry, cardiac, respiratory, lymph, extremities, GI, GU, Musculoskeletal, constitutional, breasts, reproductive, HEENT.  All pertinent positives are noted in the HPI.  All others are negative.    PHYSICAL EXAMINATION: ECOG PERFORMANCE STATUS: 2 - Symptomatic, <50% confined to bed  Vitals:   07/02/19 1321  BP: 125/88  Pulse: 83  Resp: 18  Temp: 98.7 F (37.1 C)  SpO2: 99%   Filed Weights   07/02/19 1321  Weight: 126 lb 8 oz (57.4 kg)   Body mass index is 21.71 kg/m.   Exam given in chair  GENERAL:alert, in no acute distress and comfortable SKIN: no acute rashes, no significant lesions EYES: conjunctiva are pink and non-injected, sclera anicteric OROPHARYNX: MMM, no exudates, no oropharyngeal erythema or ulceration NECK: supple, no JVD LYMPH:  no palpable lymphadenopathy in the cervical, axillary or inguinal regions LUNGS: clear to auscultation b/l with normal respiratory effort HEART: regular rate & rhythm ABDOMEN:  normoactive bowel sounds , non tender, not distended. Extremity: no pedal edema PSYCH: alert & oriented x 3 with fluent speech NEURO: no focal motor/sensory deficits   LABORATORY DATA:  I have reviewed the data as listed  CBC Latest Ref Rng & Units 07/02/2019 05/28/2019 05/07/2018  WBC 4.0 - 10.5 K/uL 9.9 8.9 7.3  Hemoglobin 12.0 - 15.0 g/dL 16.3(H) 17.7(H) 16.5(H)  Hematocrit 36.0 - 46.0 % 50.5(H) 54.0(H) 50.8(H)  Platelets 150 - 400 K/uL 212 229 303    CMP Latest Ref Rng & Units 07/02/2019 05/28/2019 05/07/2018  Glucose 70 - 99 mg/dL 99 98 017(P)  BUN 6 - 20 mg/dL 19 12 10(C)  Creatinine 0.44 - 1.00 mg/dL 5.85 2.77 8.24  Sodium 135 - 145  mmol/L 144 145(H) 137  Potassium 3.5 - 5.1 mmol/L 4.6 4.7 4.1  Chloride 98 - 111 mmol/L 108 103 98  CO2 22 - 32 mmol/L 27 24 26   Calcium 8.9 - 10.3 mg/dL 9.7 9.8  Total Protein 6.5 - 8.1 g/dL 7.6 7.7 -  Total Bilirubin 0.3 - 1.2 mg/dL 0.5 0.4 -  Alkaline Phos 38 - 126 U/L 113 136(H) -  AST 15 - 41 U/L 21 29 -  ALT 0 - 44 U/L 14 18 -      RADIOGRAPHIC STUDIES: I have personally reviewed the radiological images as listed and agreed with the findings in the report. No results found.   ASSESSMENT & PLAN:  Kristal Perl is a 59 y.o. female with:  1. Polycythemia PLAN: -Discussed patient's most recent labs from (05/28/2019) CBC: All values WNL except: RBC at 5.87, Hemoglobin at 17.7, Hematocrit at 54, Sodium at 145, Alkaline Phosphatase at 136. -Blood is thicker -Discussed 05/28/2019 Thyroid Panel with TSH: all values WNL except: T3 Uptake Ratio at 23 -Advised dehydration can cause decreased plasma volume and secondary polycythemia -Possible explanations could be polycythemia vera vs secondary causes  Such as her smoking and dehydration. -Recommends hydrating and stopping smoking  -Recommends getting genetic mutation testing to r/o clonal erthyropoeisis   FOLLOW UP Labs today RTC with Dr 05/30/2019 as needed based on labs  . Orders Placed This Encounter  Procedures  . CBC with Differential/Platelet    Standing Status:   Future    Number of Occurrences:   1    Standing Expiration Date:   08/05/2020  . CMP (Cancer Center only)    Standing Status:   Future    Number of Occurrences:   1    Standing Expiration Date:   07/01/2020  . JAK2 (including V617F and Exon 12), MPL, and CALR-Next Generation Sequencing    Standing Status:   Future    Number of Occurrences:   1    Standing Expiration Date:   07/01/2020  . Erythropoietin    Standing Status:   Future    Number of Occurrences:   1    Standing Expiration Date:   07/01/2020  All of the patients questions were answered with  apparent satisfaction. The patient knows to call the clinic with any problems, questions or concerns.  The total time spent in the appt was 35 minutes and more than 50% was on counseling and direct patient cares.  All of the patient's questions were answered with apparent satisfaction. The patient knows to call the clinic with any problems, questions or concerns.Wyvonnia Lora MD MS AAHIVMS [facility] Hematology/Oncology Physician [facility]  (Office):       [phone] (Work cell):  [phone] (Fax):           [phone]  07/01/2019 11:55 AM  I, Dorthula Perfect am acting as a scribe for Dr. Wyvonnia Lora.   .I have reviewed the above documentation for accuracy and completeness, and I agree with the above. Johney Maine MD

## 2019-07-02 ENCOUNTER — Other Ambulatory Visit: Payer: Self-pay

## 2019-07-02 ENCOUNTER — Inpatient Hospital Stay: Payer: Self-pay | Attending: Hematology | Admitting: Hematology

## 2019-07-02 ENCOUNTER — Encounter: Payer: Self-pay | Admitting: Hematology

## 2019-07-02 ENCOUNTER — Inpatient Hospital Stay: Payer: Self-pay

## 2019-07-02 VITALS — BP 125/88 | HR 83 | Temp 98.7°F | Resp 18 | Ht 64.0 in | Wt 126.5 lb

## 2019-07-02 DIAGNOSIS — E7800 Pure hypercholesterolemia, unspecified: Secondary | ICD-10-CM | POA: Insufficient documentation

## 2019-07-02 DIAGNOSIS — M810 Age-related osteoporosis without current pathological fracture: Secondary | ICD-10-CM | POA: Insufficient documentation

## 2019-07-02 DIAGNOSIS — G40909 Epilepsy, unspecified, not intractable, without status epilepticus: Secondary | ICD-10-CM | POA: Insufficient documentation

## 2019-07-02 DIAGNOSIS — Q780 Osteogenesis imperfecta: Secondary | ICD-10-CM | POA: Insufficient documentation

## 2019-07-02 DIAGNOSIS — D751 Secondary polycythemia: Secondary | ICD-10-CM | POA: Insufficient documentation

## 2019-07-02 DIAGNOSIS — I10 Essential (primary) hypertension: Secondary | ICD-10-CM | POA: Insufficient documentation

## 2019-07-02 DIAGNOSIS — Z79899 Other long term (current) drug therapy: Secondary | ICD-10-CM | POA: Insufficient documentation

## 2019-07-02 DIAGNOSIS — F17210 Nicotine dependence, cigarettes, uncomplicated: Secondary | ICD-10-CM | POA: Insufficient documentation

## 2019-07-02 DIAGNOSIS — E785 Hyperlipidemia, unspecified: Secondary | ICD-10-CM | POA: Insufficient documentation

## 2019-07-02 LAB — CBC WITH DIFFERENTIAL/PLATELET
Abs Immature Granulocytes: 0.03 10*3/uL (ref 0.00–0.07)
Basophils Absolute: 0 10*3/uL (ref 0.0–0.1)
Basophils Relative: 0 %
Eosinophils Absolute: 0.2 10*3/uL (ref 0.0–0.5)
Eosinophils Relative: 2 %
HCT: 50.5 % — ABNORMAL HIGH (ref 36.0–46.0)
Hemoglobin: 16.3 g/dL — ABNORMAL HIGH (ref 12.0–15.0)
Immature Granulocytes: 0 %
Lymphocytes Relative: 21 %
Lymphs Abs: 2.1 10*3/uL (ref 0.7–4.0)
MCH: 29.9 pg (ref 26.0–34.0)
MCHC: 32.3 g/dL (ref 30.0–36.0)
MCV: 92.7 fL (ref 80.0–100.0)
Monocytes Absolute: 0.4 10*3/uL (ref 0.1–1.0)
Monocytes Relative: 4 %
Neutro Abs: 7.1 10*3/uL (ref 1.7–7.7)
Neutrophils Relative %: 73 %
Platelets: 212 10*3/uL (ref 150–400)
RBC: 5.45 MIL/uL — ABNORMAL HIGH (ref 3.87–5.11)
RDW: 12.5 % (ref 11.5–15.5)
WBC: 9.9 10*3/uL (ref 4.0–10.5)
nRBC: 0 % (ref 0.0–0.2)

## 2019-07-02 LAB — CMP (CANCER CENTER ONLY)
ALT: 14 U/L (ref 0–44)
AST: 21 U/L (ref 15–41)
Albumin: 4 g/dL (ref 3.5–5.0)
Alkaline Phosphatase: 113 U/L (ref 38–126)
Anion gap: 9 (ref 5–15)
BUN: 19 mg/dL (ref 6–20)
CO2: 27 mmol/L (ref 22–32)
Calcium: 9.7 mg/dL (ref 8.9–10.3)
Chloride: 108 mmol/L (ref 98–111)
Creatinine: 0.86 mg/dL (ref 0.44–1.00)
GFR, Est AFR Am: >60 mL/min (ref >60)
GFR, Estimated: >60 mL/min (ref >60)
Glucose, Bld: 99 mg/dL (ref 70–99)
Potassium: 4.6 mmol/L (ref 3.5–5.1)
Sodium: 144 mmol/L (ref 135–145)
Total Bilirubin: 0.5 mg/dL (ref 0.3–1.2)
Total Protein: 7.6 g/dL (ref 6.5–8.1)

## 2019-07-02 NOTE — Patient Instructions (Signed)
Thank you for choosing [facility] to provide your oncology and hematology care.   Should you have questions after your visit to the [facility] ([facility]), please contact this office at [phone] between 8:30 AM and 4:30 PM.  Voice mails left after 4:00 PM may not be returned until the following business day.  Calls received after 4:30 PM will be answered by an off-site Nurse Triage Line.    Prescription Refills:  Please have your pharmacy contact us directly for most prescription requests.  Contact the office directly for refills of narcotics (pain medications). Allow 48-72 hours for refills.  Appointments: Please contact the [facility] scheduling department [phone] for questions regarding [facility] appointment scheduling.  Contact the schedulers with any scheduling changes so that your appointment can be rescheduled in a timely manner.   Central Scheduling for [facility] [phone] - Call to schedule procedures such as PET scans, CT scans, MRI, Ultrasound, etc.  To afford each patient quality time with our providers, please arrive 30 minutes before your scheduled appointment time.  If you arrive late for your appointment, you may be asked to reschedule.  We strive to give you quality time with our providers, and arriving late affects you and other patients whose appointments are after yours. If you are a no show for multiple scheduled visits, you may be dismissed from the clinic at the providers discretion.     Resources: [facility] Social Workers [phone] for additional information on assistance programs or assistance connecting with community support programs   [place] DSS  336-641-3447: Information regarding food stamps, Medicaid, and utility assistance [place] 336-333-6589   [place] [employer]'s shared-ride transportation service for eligible riders who have a disability that prevents them from riding the fixed route bus.   Medicare Rights Center  800-333-4114 Helps people with Medicare understand their rights and benefits, navigate the Medicare system, and secure the quality healthcare they deserve [employer] 800-227-2345 Assists patients locate various types of support and financial assistance Cancer Care: 1-800-813-HOPE (4673) Provides financial assistance, online support groups, medication/co-pay assistance.   Transportation Assistance for appointments at [facility]: Transportation Coordinator [phone]  Again, thank you for choosing [facility] for your care.       

## 2019-07-03 LAB — ERYTHROPOIETIN: Erythropoietin: 5.1 m[IU]/mL (ref 2.6–18.5)

## 2019-07-21 ENCOUNTER — Encounter: Payer: Self-pay | Admitting: Neurology

## 2019-08-04 LAB — JAK2 (INCLUDING V617F AND EXON 12), MPL,& CALR-NEXT GEN SEQ

## 2019-08-05 ENCOUNTER — Ambulatory Visit: Payer: Self-pay | Admitting: Neurology

## 2019-08-09 ENCOUNTER — Encounter: Payer: Self-pay | Admitting: Family Medicine

## 2019-08-12 ENCOUNTER — Ambulatory Visit: Payer: Self-pay | Admitting: Neurology

## 2019-08-20 ENCOUNTER — Ambulatory Visit: Payer: Self-pay | Admitting: Family Medicine

## 2019-08-25 ENCOUNTER — Ambulatory Visit: Payer: Self-pay | Admitting: Neurology

## 2019-09-01 ENCOUNTER — Ambulatory Visit: Payer: Self-pay | Admitting: Critical Care Medicine

## 2019-09-01 NOTE — Progress Notes (Deleted)
   Subjective:    Patient ID: Rose Bridges, female    DOB: [birth year], 59 y.o.   MRN: [id number]  HPI    Review of Systems     Objective:   Physical Exam        Assessment & Plan:

## 2019-09-02 ENCOUNTER — Ambulatory Visit: Payer: Self-pay | Admitting: Neurology

## 2019-09-05 ENCOUNTER — Encounter: Payer: Self-pay | Admitting: Hematology

## 2019-09-09 ENCOUNTER — Encounter: Payer: Self-pay | Admitting: Neurology

## 2019-09-09 ENCOUNTER — Other Ambulatory Visit: Payer: Self-pay

## 2019-09-09 ENCOUNTER — Encounter: Payer: Self-pay | Admitting: Critical Care Medicine

## 2019-09-09 ENCOUNTER — Ambulatory Visit: Payer: Self-pay | Attending: Critical Care Medicine | Admitting: Critical Care Medicine

## 2019-09-09 VITALS — BP 125/82 | HR 91 | Temp 98.0°F | Resp 16 | Ht 65.0 in | Wt 126.0 lb

## 2019-09-09 DIAGNOSIS — M25551 Pain in right hip: Secondary | ICD-10-CM

## 2019-09-09 DIAGNOSIS — Q780 Osteogenesis imperfecta: Secondary | ICD-10-CM

## 2019-09-09 DIAGNOSIS — G40909 Epilepsy, unspecified, not intractable, without status epilepticus: Secondary | ICD-10-CM

## 2019-09-09 DIAGNOSIS — M25552 Pain in left hip: Secondary | ICD-10-CM

## 2019-09-09 DIAGNOSIS — R569 Unspecified convulsions: Secondary | ICD-10-CM

## 2019-09-09 DIAGNOSIS — G8929 Other chronic pain: Secondary | ICD-10-CM

## 2019-09-09 MED ORDER — FOLIC ACID 1 MG PO TABS: 1.0000 mg | ORAL_TABLET | Freq: Every day | ORAL | 3 refills | Status: DC

## 2019-09-09 MED ORDER — THIAMINE HCL 100 MG PO TABS: 100.0000 mg | ORAL_TABLET | Freq: Every day | ORAL | 2 refills | Status: DC

## 2019-09-09 MED ORDER — LEVETIRACETAM 500 MG PO TABS: 500.0000 mg | ORAL_TABLET | Freq: Two times a day (BID) | ORAL | 1 refills | Status: DC

## 2019-09-09 MED ORDER — METHOCARBAMOL 500 MG PO TABS: 500.0000 mg | ORAL_TABLET | Freq: Three times a day (TID) | ORAL | 1 refills | Status: AC | PRN

## 2019-09-09 NOTE — Assessment & Plan Note (Signed)
History of seizure disorder stable as time we will maintain Keppra 500 mg twice daily   We will also continue folic acid and thiamine supplementation

## 2019-09-09 NOTE — Progress Notes (Signed)
Here for 12 wks F /U / Need meds refills  Made Md aware of PHQ and Gad screening

## 2019-09-09 NOTE — Progress Notes (Signed)
Subjective:    Patient ID: Rose Bridges, female    DOB: 1960-10-24, 59 y.o.   MRN: [id number]  This is a 59 year old female primary care patient of Dr. Jillyn Hidden seen today for return follow-up  This patient returns in follow-up after being seen in January for establish for primary care.  Patient has history of osteogenesis imperfect with history of acetabular fracture on the left.  The patient also has history of a seizure disorder as well.  She does live alone at this time.  She states she does have chronic pain in the left hip area and wishes to follow-up with orthopedics.  Patient has a history of hypertension but recent blood pressures have been in normal range and she had been on hydralazine but was not taking this.  She does state Robaxin helps the left hip pain and would like refills on this.  She also needing refills on folic acid and thiamine.  Previously she had seen neurology and would like follow-up with neurology for her seizure disorder for which he maintains Keppra twice daily.  She has had no seizures that she is aware of.  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  The patient denies any other new current issues   Past Medical History:  Diagnosis Date  . Hyperlipemia   . Hypertension   . Osteogenesis imperfecta   . Osteoporosis      Family History  Problem Relation Age of Onset  . Stroke Mother   . Heart attack Mother   . Hypertension Mother   . Stroke Sister   . Stroke Brother   . Lung cancer Father      Social History   Socioeconomic History  . Marital status: Divorced    Spouse name: Not on file  . Number of children: 1  . Years of education: College  . Highest education level: Not on file  Occupational History  . Occupation: Film/video editor  Tobacco Use  . Smoking status: Current Every Day Smoker    Packs/day: 1.00  . Smokeless tobacco: Never Used  Substance and Sexual Activity  .  Alcohol use: No    Alcohol/week: 85.0 standard drinks    Types: 85 Shots of liquor per week    Comment: No alcohol use since 11/04/16.  . Drug use: No  . Sexual activity: Not Currently  Other Topics Concern  . Not on file  Social History Narrative   Pt lives with her mother.   Right-handed.   1-2 bottles (16Oz each) of [facility] or Dr. Reino Kent.   Occasionally drinks Monster drinks.   Social Determinants of Health   Financial Resource Strain:   . Difficulty of Paying Living Expenses:   Food Insecurity:   . Worried About Programme researcher, broadcasting/film/video in the Last Year:   . Barista in the Last Year:   Transportation Needs:   . Freight forwarder (Medical):   Marland Kitchen Lack of Transportation (Non-Medical):   Physical Activity:   . Days of Exercise per Week:   . Minutes of Exercise per Session:   Stress:   . Feeling of Stress :   Social Connections:   . Frequency of Communication with Friends and Family:   . Frequency of Social Gatherings with Friends and Family:   . Attends Religious Services:   . Active Member of Clubs or Organizations:   . Attends Banker Meetings:   .  Marital Status:   Intimate Partner Violence:   . Fear of Current or Ex-Partner:   . Emotionally Abused:   Marland Kitchen Physically Abused:   . Sexually Abused:      Allergies  Allergen Reactions  . Codeine     "migraines"      Outpatient Medications Prior to Visit  Medication Sig Dispense Refill  . naproxen sodium (ALEVE) 220 MG tablet Take 440 mg by mouth 2 (two) times daily.    Marland Kitchen senna-docusate (SENOKOT-S) 8.6-50 MG tablet Take 1 tablet by mouth 2 (two) times daily. 30 tablet 0  . folic acid (FOLVITE) 1 MG tablet Take 1 tablet (1 mg total) by mouth daily. 90 tablet 3  . levETIRAcetam (KEPPRA) 500 MG tablet Take 1 tablet (500 mg total) by mouth 2 (two) times daily. 180 tablet 1  . methocarbamol (ROBAXIN) 500 MG tablet Take 1 tablet (500 mg total) by mouth every 8 (eight) hours as needed for muscle  spasms. 30 tablet 4  . thiamine 100 MG tablet Take 1 tablet (100 mg total) by mouth daily. 90 tablet 1  . bisacodyl (DULCOLAX) 5 MG EC tablet Take 1 tablet (5 mg total) by mouth daily as needed for moderate constipation. (Patient not taking: Reported on 05/28/2019) 30 tablet 0  . hydrALAZINE (APRESOLINE) 25 MG tablet Take 1 tablet (25 mg total) by mouth 2 (two) times daily. (Patient not taking: Reported on 09/09/2019) 60 tablet 0  . Multiple Vitamin (MULTIVITAMIN WITH MINERALS) TABS tablet Take 1 tablet by mouth daily. (Patient not taking: Reported on 05/28/2019) 30 tablet 0  . polyethylene glycol (MIRALAX / GLYCOLAX) packet Take 17 g by mouth daily. (Patient not taking: Reported on 05/28/2019) 14 each 0   No facility-administered medications prior to visit.     Review of Systems Constitutional:   No  weight loss, night sweats,  Fevers, chills, fatigue, lassitude. HEENT:   No headaches,  Difficulty swallowing,  Tooth/dental problems,  Sore throat,                No sneezing, itching, ear ache, nasal congestion, post nasal drip,   CV:  No chest pain,  Orthopnea, PND, swelling in lower extremities, anasarca, dizziness, palpitations  GI  No heartburn, indigestion, abdominal pain, nausea, vomiting, diarrhea, change in bowel habits, loss of appetite  Resp: No shortness of breath with exertion or at rest.  No excess mucus, no productive cough,  No non-productive cough,  No coughing up of blood.  No change in color of mucus.  No wheezing.  No chest wall deformity  Skin: no rash or lesions.  GU: no dysuria, change in color of urine, no urgency or frequency.  No flank pain.  MS:   joint pain or swelling.  No decreased range of motion.  No back pain.  Psych:  No change in mood or affect. No depression or anxiety.  No memory loss.     Objective:   Physical Exam Vitals:   09/09/19 1037  BP: 125/82  Pulse: 91  Resp: 16  Temp: 98 F (36.7 C)  SpO2: 100%  Weight: 126 lb (57.2 kg)  Height: 5'  5" (1.651 m)    Gen: Pleasant, thin, in no distress,  normal affect  ENT: No lesions,  mouth clear,  oropharynx clear, no postnasal drip, dentures in place  Neck: No JVD, no TMG, no carotid bruits  Lungs: No use of accessory muscles, no dullness to percussion, clear without rales or rhonchi  Cardiovascular: RRR, heart  sounds normal, no murmur or gallops, no peripheral edema  Abdomen: soft and NT, no HSM,  BS normal  Musculoskeletal: No deformities, no cyanosis or clubbing  Neuro: alert, non focal All imaging studies and labs reviewed from before in the [facility] system S   Assessment & Plan:  I personally reviewed all images and lab data in the [facility] system as well as any outside material available during this office visit and agree with the  radiology impressions.   Osteogenesis imperfecta Osteogenesis imperfecta with frequent fractures after falls  Will refer back to orthopedics to establish current status of bone structure  Chronic pain we will refill the methocarbamol every 8 hours as needed  Seizure ([facility]) History of seizure disorder stable as time we will maintain Keppra 500 mg twice daily   We will also continue folic acid and thiamine supplementation   Diagnoses and all orders for this visit:  Seizure disorder ([facility]CC) -     levETIRAcetam (KEPPRA) 500 MG tablet; Take 1 tablet (500 mg total) by mouth 2 (two) times daily. -     Ambulatory referral to Neurology  Osteogenesis imperfecta -     folic acid (FOLVITE) 1 MG tablet; Take 1 tablet (1 mg total) by mouth daily. -     thiamine 100 MG tablet; Take 1 tablet (100 mg total) by mouth daily. -     Ambulatory referral to Orthopedic Surgery  Chronic pain of both hips -     methocarbamol (ROBAXIN) 500 MG tablet; Take 1 tablet (500 mg total) by mouth every 8 (eight) hours as needed for muscle spasms. -     Ambulatory referral to Orthopedic Surgery  Seizure [facility])   We will plan on setting up a Covid vaccine in the  patient's own to the Covid immunization homebound program

## 2019-09-09 NOTE — Patient Instructions (Signed)
Your medications will be mailed to your home  We will arrange a covid vaccine in your home, they will call you  Referrals to orthopedics and neurology will be made  Return Dr Jillyn Hidden in 4 months

## 2019-09-09 NOTE — Assessment & Plan Note (Addendum)
Osteogenesis imperfecta with frequent fractures after falls  Will refer back to orthopedics to establish current status of bone structure  Chronic pain we will refill the methocarbamol every 8 hours as needed

## 2019-09-16 ENCOUNTER — Other Ambulatory Visit: Payer: Self-pay

## 2019-09-16 ENCOUNTER — Ambulatory Visit (INDEPENDENT_AMBULATORY_CARE_PROVIDER_SITE_OTHER): Payer: Self-pay

## 2019-09-16 ENCOUNTER — Ambulatory Visit (INDEPENDENT_AMBULATORY_CARE_PROVIDER_SITE_OTHER): Payer: Self-pay | Admitting: Orthopaedic Surgery

## 2019-09-16 ENCOUNTER — Encounter: Payer: Self-pay | Admitting: Orthopaedic Surgery

## 2019-09-16 VITALS — Ht 65.0 in | Wt 130.0 lb

## 2019-09-16 DIAGNOSIS — M25552 Pain in left hip: Secondary | ICD-10-CM

## 2019-09-16 NOTE — Progress Notes (Signed)
Office Visit Note   Patient: Rose Bridges           Date of Birth: [birth year]           MRN: [id number] Visit Date: 09/16/2019              Requested by: Storm Frisk, MD [address] [place],  [place] [address] PCP: Cain Saupe, MD   Assessment & Plan: Visit Diagnoses:  1. Pain in left hip     Plan: Impression is chronic left hip and groin pain status post left femoral neck fracture and hip hemiarthroplasty.  My impression is that she is having pain from the component wearing on the acetabulum.  I do not get the sense that she has loosening although I did suggest getting a bone scan to rule this out but due to the fact that she does not have insurance she declined this for now.  My recommendation is to convert the partial hip replacement to a total hip replacement.  For now she will hold off until she gets approved for insurance or some sort of financial assistance at which point she will get back in touch with [place].  Questions encouraged and answered.  Follow-Up Instructions: Return if symptoms worsen or fail to improve.   Orders:  Orders Placed This Encounter  Procedures  . XR HIP UNILAT W OR W/O PELVIS 2-3 VIEWS LEFT   No orders of the defined types were placed in this encounter.     Procedures: No procedures performed   Clinical Data: No additional findings.   Subjective: Chief Complaint  Patient presents with  . Left Hip - Pain    Rose Bridges is a 59 year old female who underwent a left hip hemiarthroplasty in 2017 for femoral neck fracture.  This was performed by Dr. Margarita Rana.  She recovered well from this surgery.  In 2019 she had a nondisplaced right hip fracture that was treated nonoperatively and she feels that due to compensation for the right hip pain her left hip and groin became very painful.  Now she has constant pain in the groin with radiation down into the thigh.  Denies any start up pain.  Denies any fevers or chills.   Review of  Systems  Constitutional: Negative.   HENT: Negative.   Eyes: Negative.   Respiratory: Negative.   Cardiovascular: Negative.   Endocrine: Negative.   Musculoskeletal: Negative.   Neurological: Negative.   Hematological: Negative.   Psychiatric/Behavioral: Negative.   All other systems reviewed and are negative.    Objective: Vital Signs: Ht 5\' 5"  (1.651 m)   Wt 130 lb (59 kg)   BMI 21.63 kg/m   Physical Exam Vitals and nursing note reviewed.  Constitutional:      Appearance: She is well-developed.  HENT:     Head: Normocephalic and atraumatic.  Pulmonary:     Effort: Pulmonary effort is normal.  Abdominal:     Palpations: Abdomen is soft.  Musculoskeletal:     Cervical back: Neck supple.  Skin:    General: Skin is warm.     Capillary Refill: Capillary refill takes less than 2 seconds.  Neurological:     Mental Status: She is alert and oriented to person, place, and time.  Psychiatric:        Behavior: Behavior normal.        Thought Content: Thought content normal.        Judgment: Judgment normal.     Ortho Exam Left  hip shows pain with logroll and positive Stinchfield.  No significant pain with internal or external rotation.  Antalgic gait.  Surgical scar is fully healed without any evidence of infection. Specialty Comments:  No specialty comments available.  Imaging: XR HIP UNILAT W OR W/O PELVIS 2-3 VIEWS LEFT  Result Date: 09/16/2019 Stable appearing partial hip replacement    PMFS History: Patient Active Problem List   Diagnosis Date Noted  . Osteogenesis imperfecta 04/28/2018  . Seizure ([place]) 03/26/2017   Past Medical History:  Diagnosis Date  . Hyperlipemia   . Hypertension   . Osteogenesis imperfecta   . Osteoporosis     Family History  Problem Relation Age of Onset  . Stroke Mother   . Heart attack Mother   . Hypertension Mother   . Stroke Sister   . Stroke Brother   . Lung cancer Father     Past Surgical History:  Procedure  Laterality Date  . ABDOMINAL HYSTERECTOMY    . HIP ARTHROPLASTY Left 09/26/2015   Procedure: ARTHROPLASTY BIPOLAR HIP (HEMIARTHROPLASTY);  Surgeon: Renette Butters, MD;  Location: [facility];  Service: Orthopedics;  Laterality: Left;  . ORIF HUMERUS FRACTURE Right 11/06/2016   Procedure: OPEN REDUCTION INTERNAL FIXATION (ORIF) PROXIMAL HUMERUS FRACTURE;  Surgeon: Renette Butters, MD;  Location: [facility];  Service: Orthopedics;  Laterality: Right;   Social History   Occupational History  . Occupation: Sales promotion account executive  Tobacco Use  . Smoking status: Current Every Day Smoker    Packs/day: 1.00  . Smokeless tobacco: Never Used  Substance and Sexual Activity  . Alcohol use: No    Alcohol/week: 85.0 standard drinks    Types: 85 Shots of liquor per week    Comment: No alcohol use since 11/04/16.  . Drug use: No  . Sexual activity: Not Currently

## 2019-09-20 IMAGING — CT CT HIP*R* W/O CM
2 of 3 series · 17 of 46 positions shown, 19 images · non-contrast
Comparison: None.

CLINICAL DATA: Worsening right hip pain over the past 2 days.

EXAM:
CT OF THE RIGHT HIP WITHOUT CONTRAST
TECHNIQUE: Multidetector CT imaging of the right hip was performed according to
the standard protocol. Multiplanar CT image reconstructions were
also generated.

[Series 3: axial st · axial · 0.38mm/px · z∈[+926,+1096]mm · 14 of 99 slices shown, 16 images]
[im 7/99  soft-tissue]
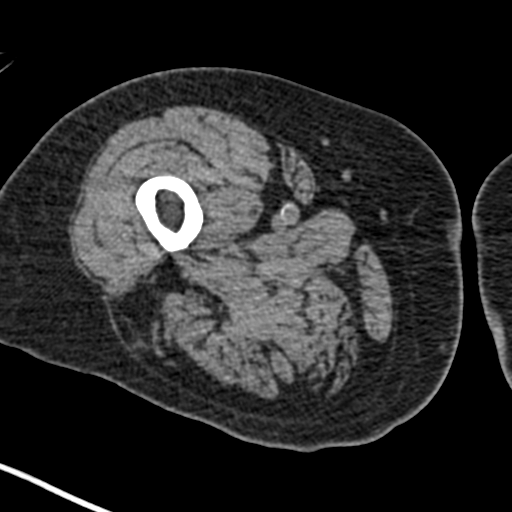
[im 7/99  bone]
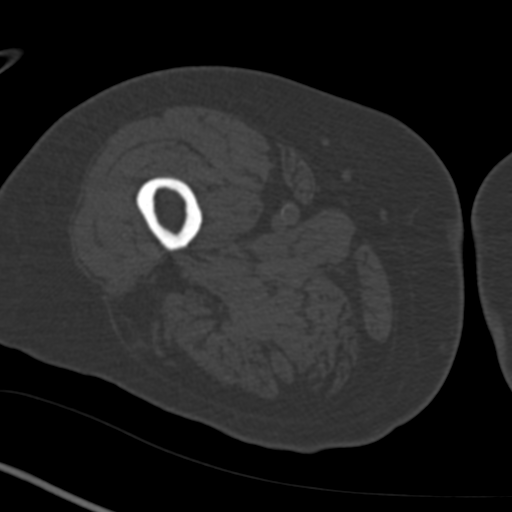
[im 13/99  soft-tissue]
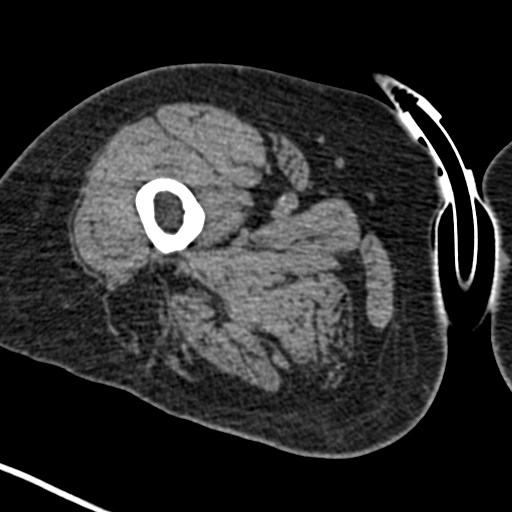
[im 19/99  soft-tissue]
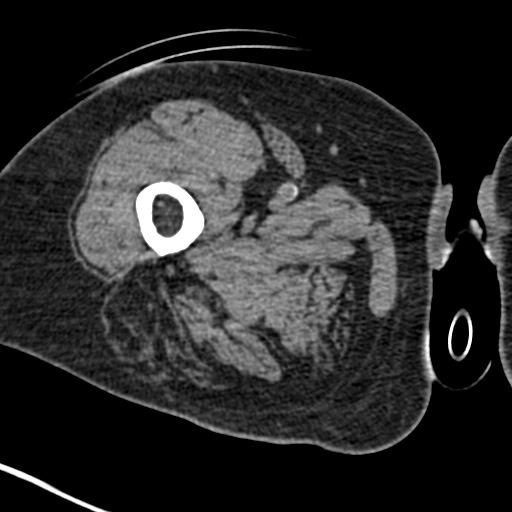
[im 26/99  soft-tissue]
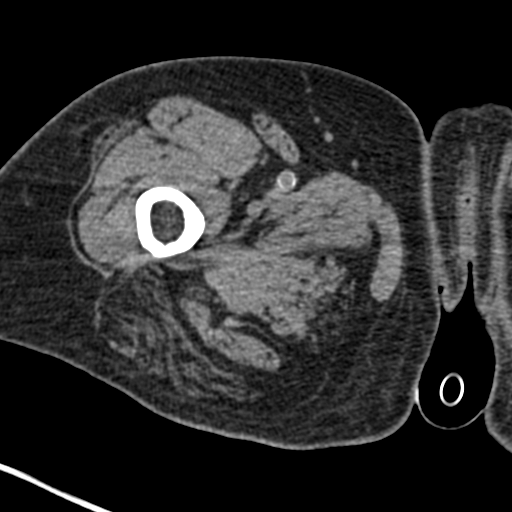
[im 32/99  soft-tissue]
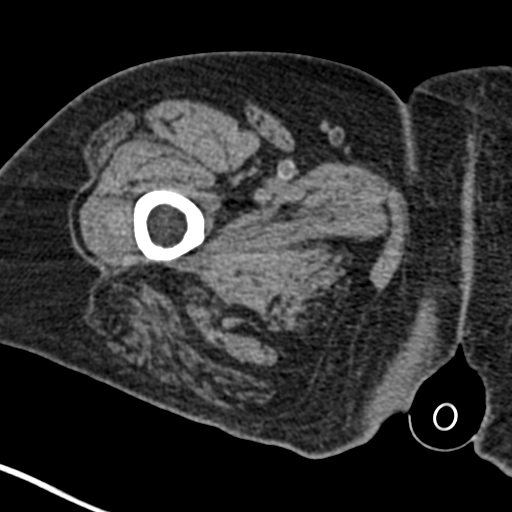
[im 38/99  soft-tissue]
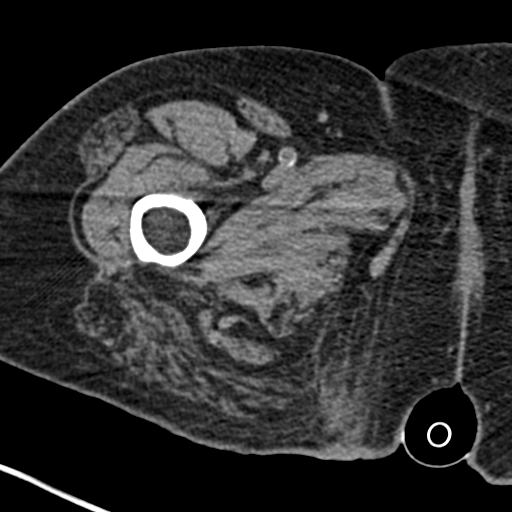
[im 45/99  soft-tissue]
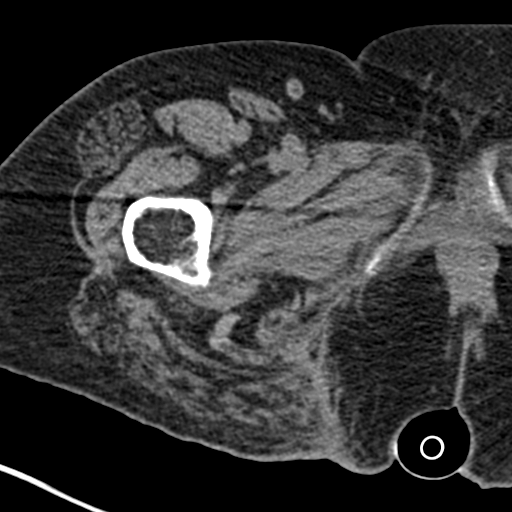
[im 54/99  soft-tissue]
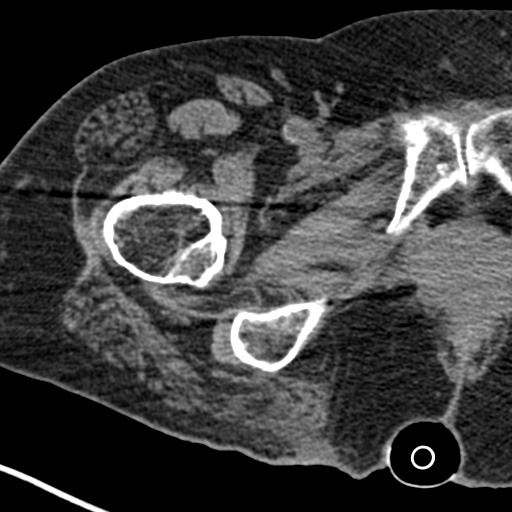
[im 61/99  soft-tissue]
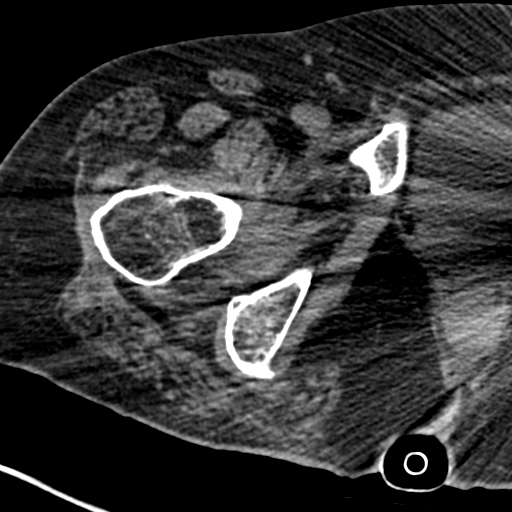
[im 61/99  bone]
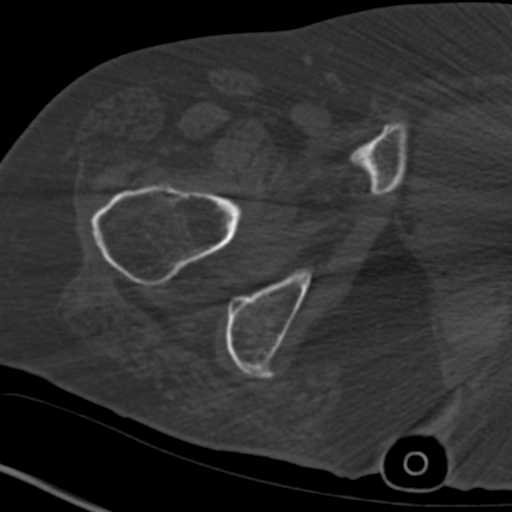
[im 67/99  soft-tissue]
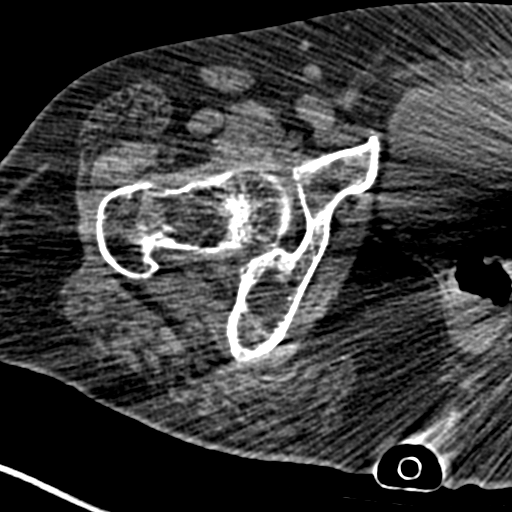
[im 73/99  soft-tissue]
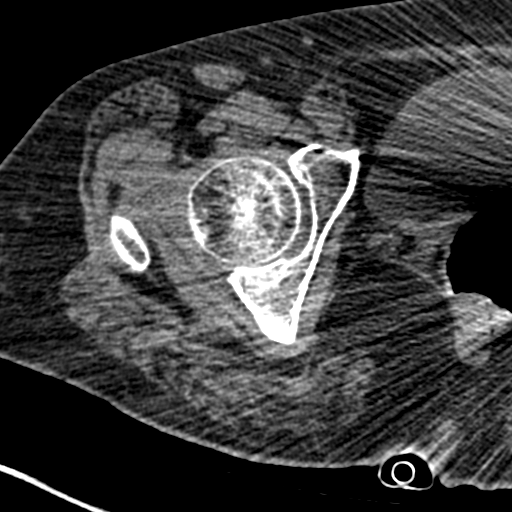
[im 80/99  soft-tissue]
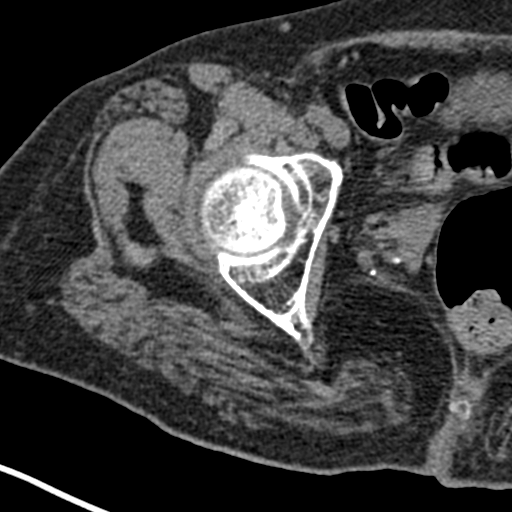
[im 86/99  soft-tissue]
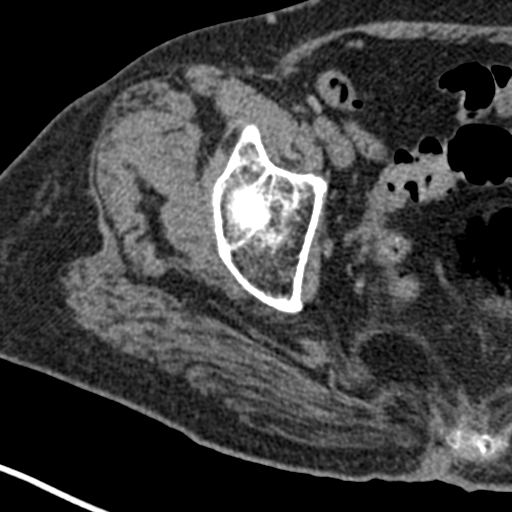
[im 92/99  soft-tissue]
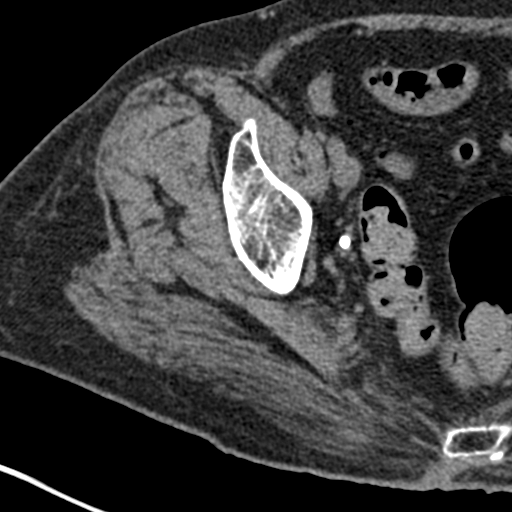

[Series 6: coronal st · coronal · 0.46mm/px · 3 of 80 slices shown]
[im 27/80  soft-tissue]
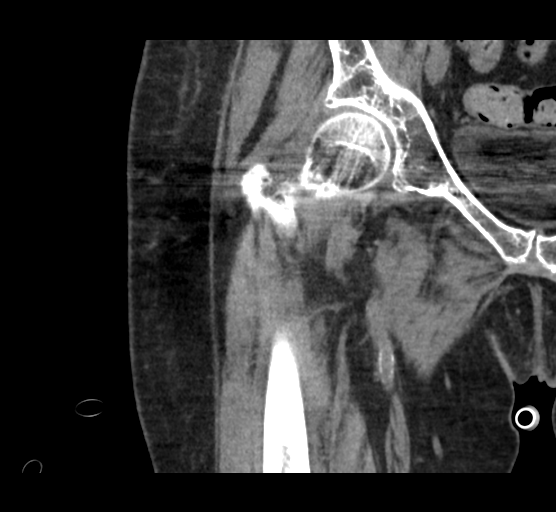
[im 36/80  soft-tissue]
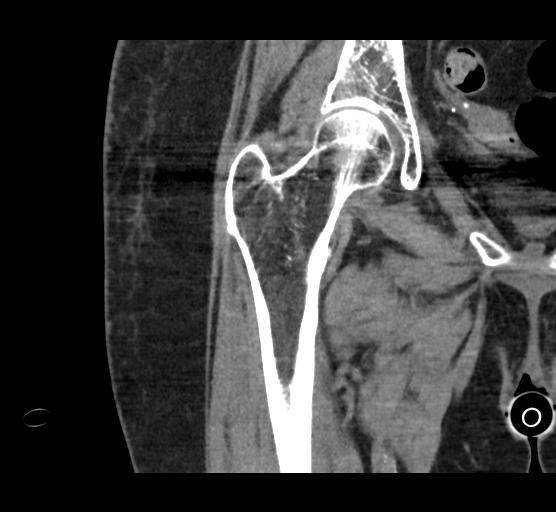
[im 44/80  soft-tissue]
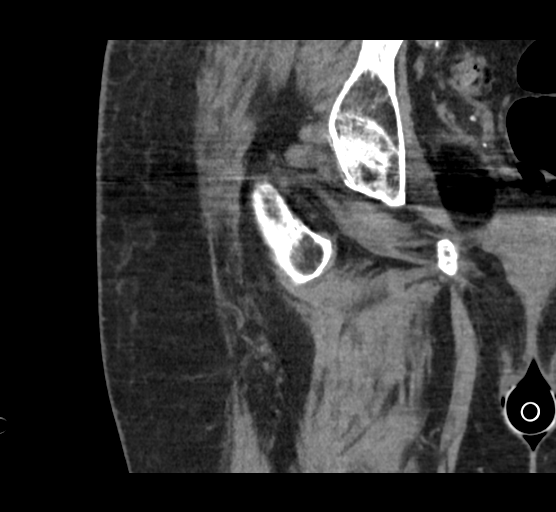

[17 of 46 positions shown; findings below may reference images not displayed]

FINDINGS: Bones/Joint/Cartilage

Nondisplaced fracture of the anterior aspect of the right acetabulum
not involving the articular surface. No interval change compared
with 04/19/2018.

Deformity of the mid right inferior pubic ramus with mild sclerosis
along the midportion likely reflecting a healing versus healed
fracture. No surrounding callus formation.

No aggressive osseous lesion.

Normal alignment. No joint effusion.

Ligaments

Ligaments are suboptimally evaluated by CT.

Muscles and Tendons
Muscles are normal.  No muscle atrophy.

Soft tissue
No fluid collection or hematoma.  No soft tissue mass.
IMPRESSION: 1. Stable nondisplaced fracture along the anterior cortex of the
anterior right acetabulum without articular surface involvement.
2. Deformity of the mid right inferior pubic ramus with mild
sclerosis along the midportion likely reflecting a healing versus
healed fracture.

## 2019-09-28 IMAGING — CR DG HIP (WITH OR WITHOUT PELVIS) 2-3V*R*
4 series · 4 of 4 positions shown · non-contrast
Comparison: April 19, 2018

CLINICAL DATA: Pain.  Recent known fracture of right ischium

EXAM:
DG HIP (WITH OR WITHOUT PELVIS) 2-3V RIGHT

[x pelvis]
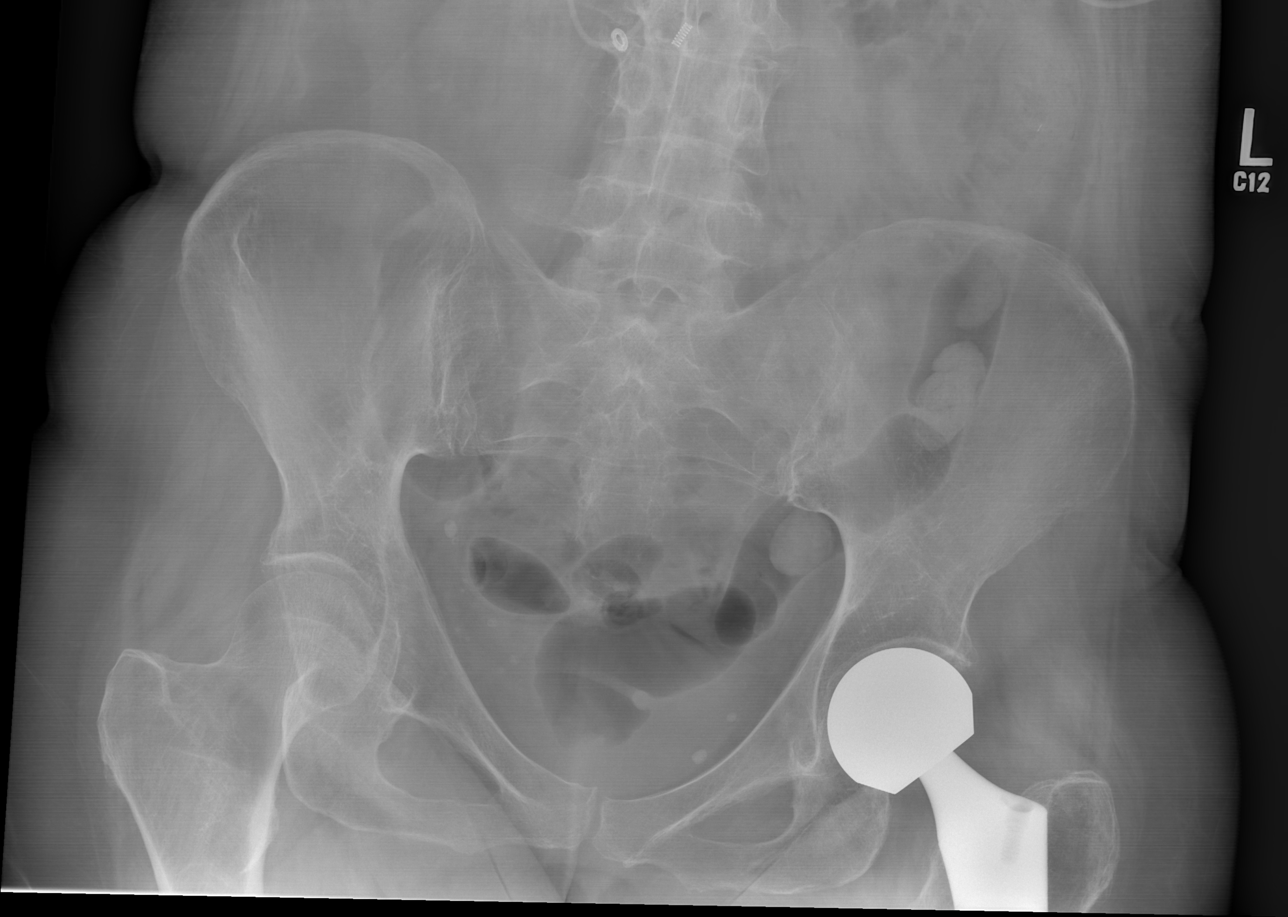

[x hip ap right (1 of 2)]
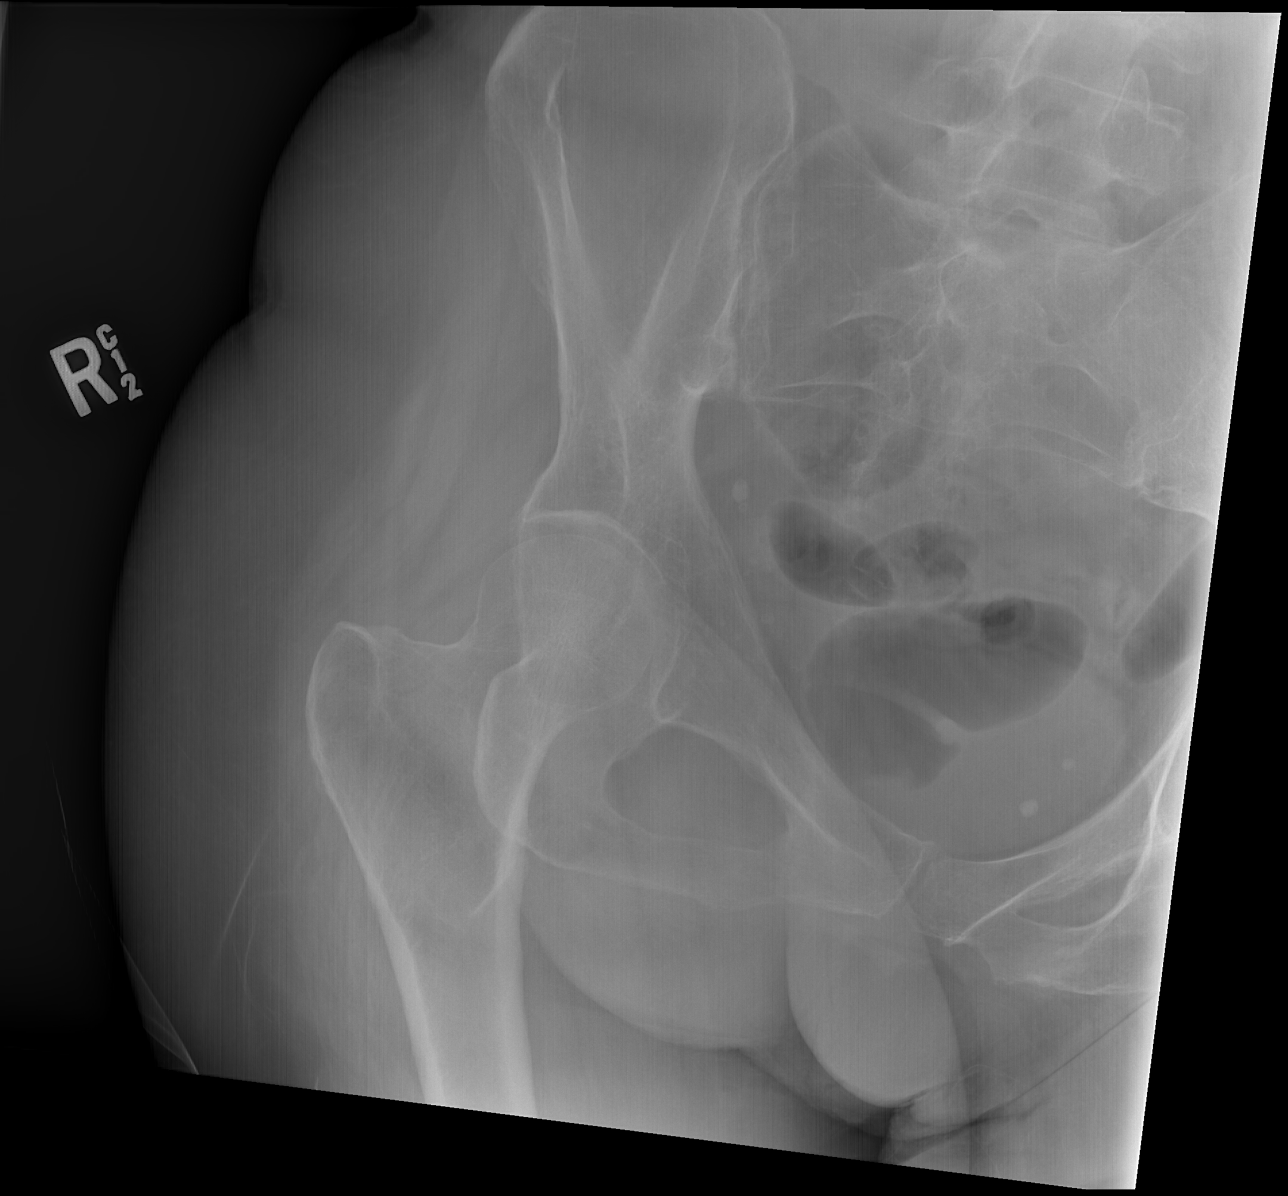

[x hip ap right (2 of 2)]
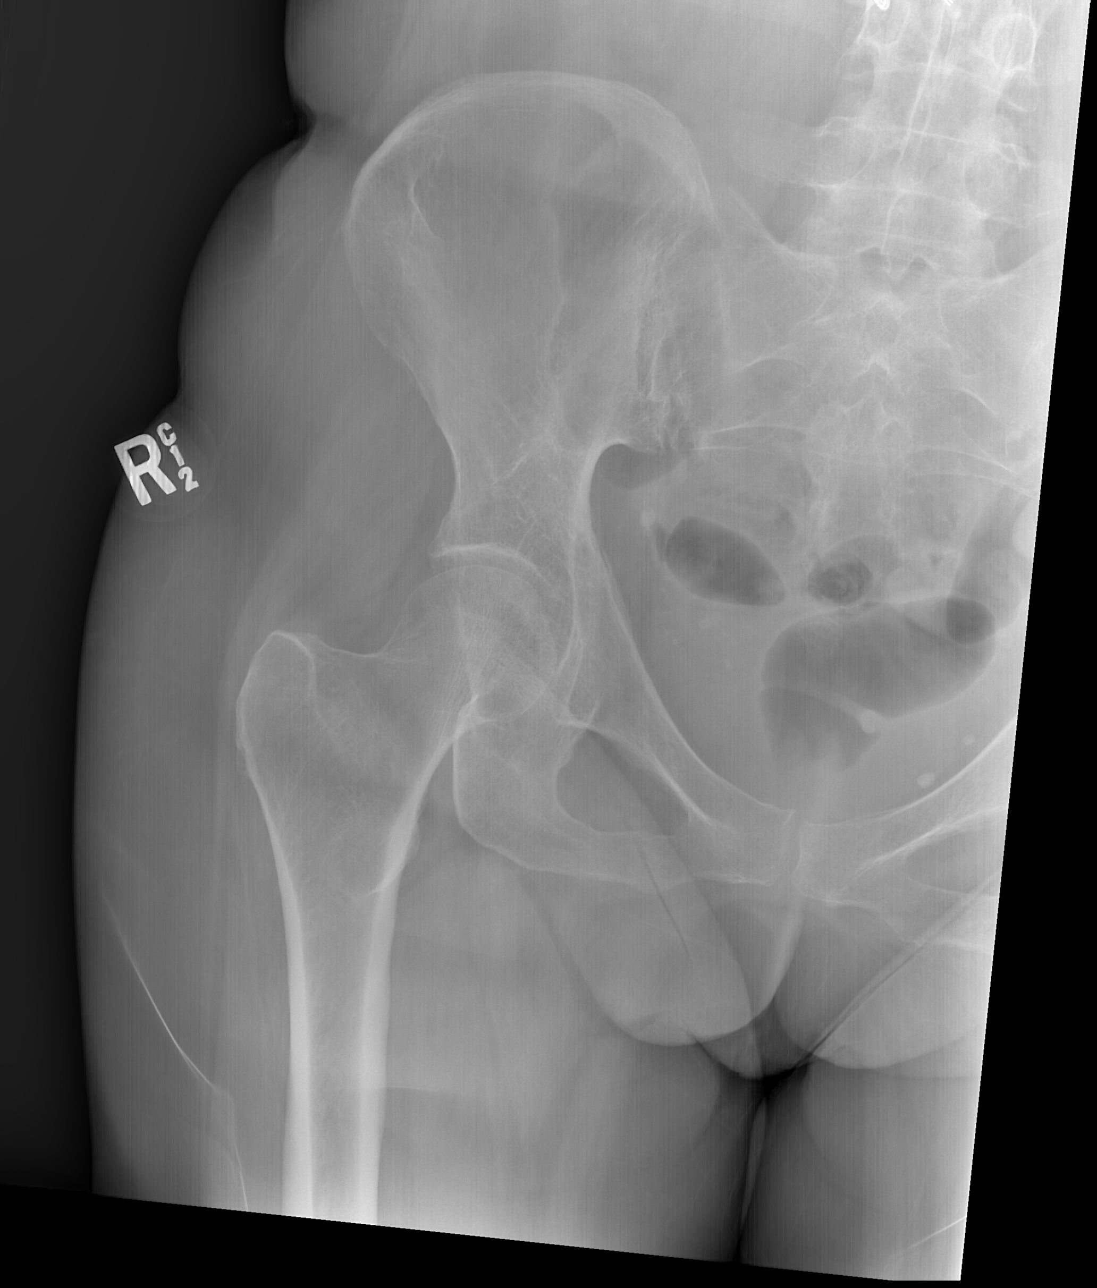

[x hip lat right]
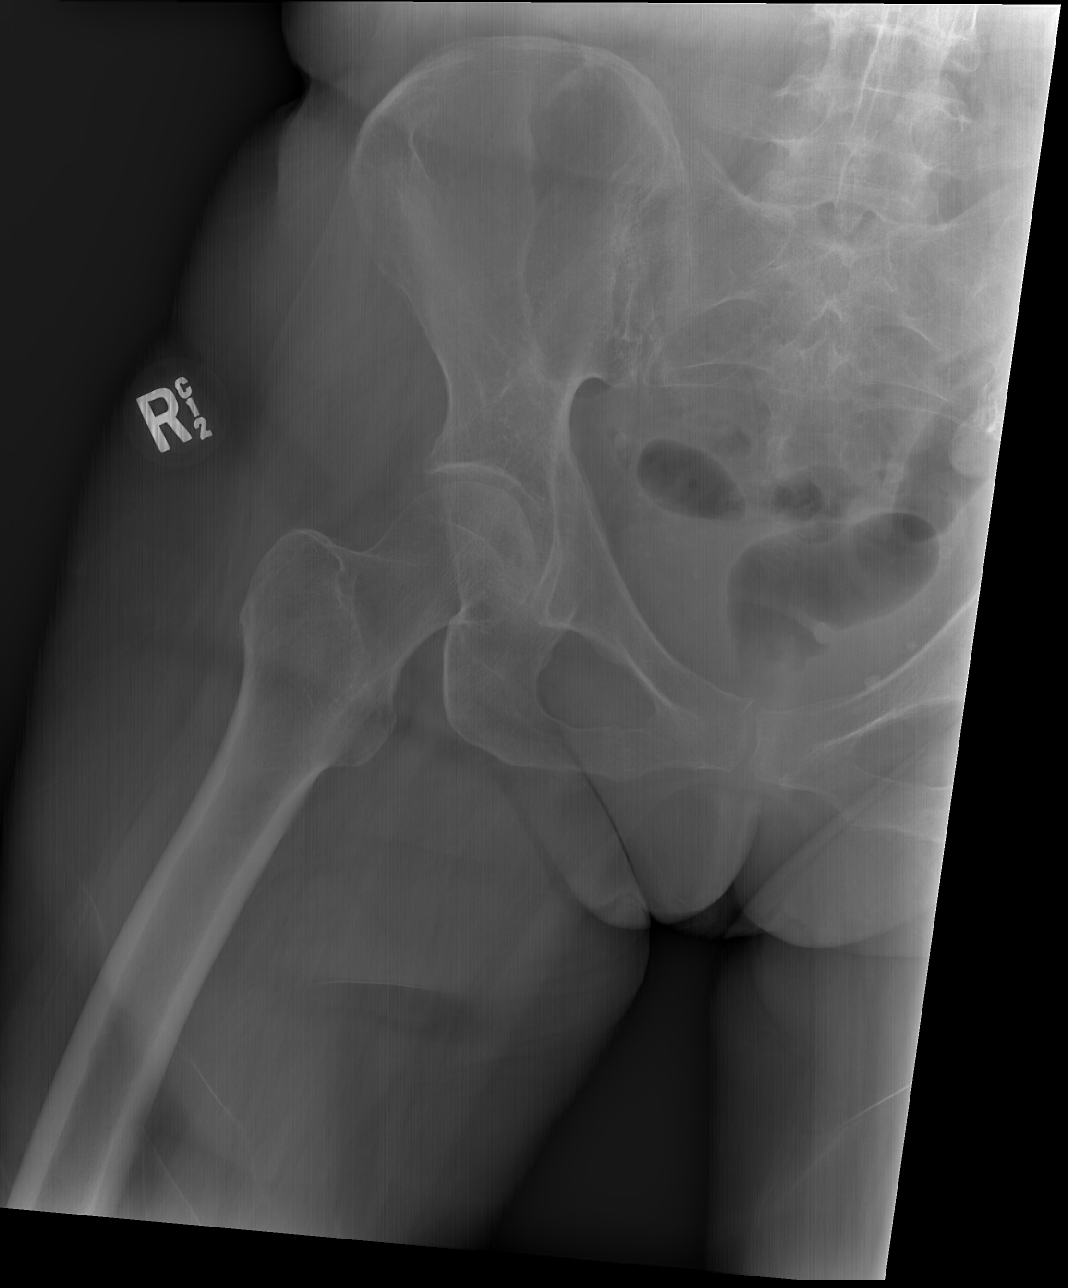

[4 of 4 positions shown; findings below may reference images not displayed]

FINDINGS: Frontal pelvis as well as frontal and lateral right hip images were
obtained. There is a total hip replacement on the left with
prosthetic components well-seated. There is a fracture of the medial
aspect of the right ischium with alignment essentially anatomic,
stable. No new fracture is demonstrable. No dislocation. Right hip
joint appears unremarkable. No erosive changes evident.
IMPRESSION: Status post total hip replacement on the left with prosthetic
component well-seated. Stable fairly recent/subacute fracture medial
right ischium with alignment near anatomic and stable. No new
fracture. No dislocation. Other joint spaces appear unremarkable.

## 2019-11-05 ENCOUNTER — Ambulatory Visit: Payer: Self-pay

## 2019-11-05 ENCOUNTER — Ambulatory Visit: Payer: Self-pay | Attending: Critical Care Medicine

## 2019-11-05 DIAGNOSIS — Z23 Encounter for immunization: Secondary | ICD-10-CM

## 2019-11-05 NOTE — Progress Notes (Signed)
° °  Covid-19 Vaccination Clinic  Name:  Rose Bridges    MRN: [id number] DOB: 12/19/1960  11/05/2019  Rose Bridges was observed post Covid-19 immunization for 15 minutes without incident. She was provided with Vaccine Information Sheet and instruction to access the V-Safe system.   Rose Bridges was instructed to call 911 with any severe reactions post vaccine:  Difficulty breathing   Swelling of face and throat   A fast heartbeat   A bad rash all over body   Dizziness and weakness   Immunizations Administered    Name Date Dose VIS Date Route   Moderna COVID-19 Vaccine 11/05/2019 10:56 AM 0.5 mL 03/2019 Intramuscular   Manufacturer: Moderna   Lot: [id number]   NDC: 91916-606-00

## 2019-11-15 ENCOUNTER — Other Ambulatory Visit: Payer: Self-pay

## 2019-11-15 ENCOUNTER — Encounter (HOSPITAL_COMMUNITY): Payer: Self-pay

## 2019-11-15 ENCOUNTER — Emergency Department (HOSPITAL_COMMUNITY)
Admission: EM | Admit: 2019-11-15 | Discharge: 2019-11-16 | Disposition: A | Discharge status: Home / Self Care | Payer: Self-pay | Attending: Emergency Medicine | Admitting: Emergency Medicine

## 2019-11-15 DIAGNOSIS — Z96642 Presence of left artificial hip joint: Secondary | ICD-10-CM | POA: Insufficient documentation

## 2019-11-15 DIAGNOSIS — I10 Essential (primary) hypertension: Secondary | ICD-10-CM | POA: Insufficient documentation

## 2019-11-15 DIAGNOSIS — M79601 Pain in right arm: Secondary | ICD-10-CM | POA: Insufficient documentation

## 2019-11-15 DIAGNOSIS — F17210 Nicotine dependence, cigarettes, uncomplicated: Secondary | ICD-10-CM | POA: Insufficient documentation

## 2019-11-15 LAB — CBC WITH DIFFERENTIAL/PLATELET
Abs Immature Granulocytes: 0.02 10*3/uL (ref 0.00–0.07)
Basophils Absolute: 0 10*3/uL (ref 0.0–0.1)
Basophils Relative: 0 %
Eosinophils Absolute: 0.4 10*3/uL (ref 0.0–0.5)
Eosinophils Relative: 4 %
HCT: 51.1 % — ABNORMAL HIGH (ref 36.0–46.0)
Hemoglobin: 16.3 g/dL — ABNORMAL HIGH (ref 12.0–15.0)
Immature Granulocytes: 0 %
Lymphocytes Relative: 27 %
Lymphs Abs: 2.2 10*3/uL (ref 0.7–4.0)
MCH: 31.3 pg (ref 26.0–34.0)
MCHC: 31.9 g/dL (ref 30.0–36.0)
MCV: 98.1 fL (ref 80.0–100.0)
Monocytes Absolute: 0.5 10*3/uL (ref 0.1–1.0)
Monocytes Relative: 6 %
Neutro Abs: 5 10*3/uL (ref 1.7–7.7)
Neutrophils Relative %: 63 %
Platelets: 213 10*3/uL (ref 150–400)
RBC: 5.21 MIL/uL — ABNORMAL HIGH (ref 3.87–5.11)
RDW: 11.9 % (ref 11.5–15.5)
WBC: 8.1 10*3/uL (ref 4.0–10.5)
nRBC: 0 % (ref 0.0–0.2)

## 2019-11-15 LAB — COMPREHENSIVE METABOLIC PANEL
ALT: 21 U/L (ref 0–44)
AST: 27 U/L (ref 15–41)
Albumin: 4.2 g/dL (ref 3.5–5.0)
Alkaline Phosphatase: 101 U/L (ref 38–126)
Anion gap: 11 (ref 5–15)
BUN: 14 mg/dL (ref 6–20)
CO2: 25 mmol/L (ref 22–32)
Calcium: 9.4 mg/dL (ref 8.9–10.3)
Chloride: 104 mmol/L (ref 98–111)
Creatinine, Ser: 0.67 mg/dL (ref 0.44–1.00)
GFR calc Af Amer: >60 mL/min (ref >60)
GFR calc non Af Amer: >60 mL/min (ref >60)
Glucose, Bld: 91 mg/dL (ref 70–99)
Potassium: 3.5 mmol/L (ref 3.5–5.1)
Sodium: 140 mmol/L (ref 135–145)
Total Bilirubin: 0.8 mg/dL (ref 0.3–1.2)
Total Protein: 7.6 g/dL (ref 6.5–8.1)

## 2019-11-15 LAB — LACTIC ACID, PLASMA: Lactic Acid, Venous: 1.6 mmol/L (ref 0.5–1.9)

## 2019-11-15 MED ORDER — SODIUM CHLORIDE 0.9% FLUSH: 3.0000 mL | Freq: Once | INTRAVENOUS | Status: DC

## 2019-11-15 MED ORDER — RIVAROXABAN 15 MG PO TABS
15.0000 mg | ORAL_TABLET | Freq: Once | ORAL | Status: AC
  Administered 2019-11-16: 15 mg via ORAL
  Filled 2019-11-15: qty 1

## 2019-11-15 MED ORDER — CLINDAMYCIN HCL 300 MG PO CAPS
300.0000 mg | ORAL_CAPSULE | Freq: Once | ORAL | Status: AC
  Administered 2019-11-16: 300 mg via ORAL
  Filled 2019-11-15: qty 1

## 2019-11-15 MED ORDER — RIVAROXABAN (XARELTO) VTE STARTER PACK (15 & 20 MG)
ORAL_TABLET | ORAL | 0 refills | Status: DC
Start: 2019-11-15 — End: 2022-04-04

## 2019-11-15 MED ORDER — OXYCODONE-ACETAMINOPHEN 5-325 MG PO TABS: 1.0000 | ORAL_TABLET | Freq: Three times a day (TID) | ORAL | 0 refills | Status: DC | PRN

## 2019-11-15 MED ORDER — CLINDAMYCIN HCL 300 MG PO CAPS
300.0000 mg | ORAL_CAPSULE | Freq: Four times a day (QID) | ORAL | 0 refills | Status: DC
Start: 2019-11-15 — End: 2019-12-06

## 2019-11-15 MED ORDER — OXYCODONE-ACETAMINOPHEN 5-325 MG PO TABS
1.0000 | ORAL_TABLET | Freq: Once | ORAL | Status: AC
  Administered 2019-11-16: 1 via ORAL
  Filled 2019-11-15: qty 1

## 2019-11-15 NOTE — ED Triage Notes (Signed)
Patient arrived stating that she has had her covid-19 vaccine and she has some soreness as expected. Over this week reports her arm began swelling and pain increased. Redness noted as well.

## 2019-11-15 NOTE — ED Provider Notes (Signed)
[facility]-EMERGENCY DEPT Provider Note   CSN: [id number] Arrival date & time: 11/15/19  1924     History Chief Complaint  Patient presents with  . Arm Pain    Shivonne Schwartzman is a 59 y.o. female.  Approximately 7 days of progressively worsening right arm redness and pain. Today got past her elbow so she called her sister who is a nurse who says she should probably get checked out. No history of blood clots. No fevers. No nausea vomiting. No systemic symptoms no other associated symptoms. Has not tried thing for the symptoms besides Aleve which she takes daily for her hip anyway and this does seem to alleviate a little bit.   Arm Pain       Past Medical History:  Diagnosis Date  . Hyperlipemia   . Hypertension   . Osteogenesis imperfecta   . Osteoporosis     Patient Active Problem List   Diagnosis Date Noted  . Osteogenesis imperfecta 04/28/2018  . Seizure (HCC) 03/26/2017    Past Surgical History:  Procedure Laterality Date  . ABDOMINAL HYSTERECTOMY    . HIP ARTHROPLASTY Left 09/26/2015   Procedure: ARTHROPLASTY BIPOLAR HIP (HEMIARTHROPLASTY);  Surgeon: Sheral Apley, MD;  Location: [facility] OR;  Service: Orthopedics;  Laterality: Left;  . ORIF HUMERUS FRACTURE Right 11/06/2016   Procedure: OPEN REDUCTION INTERNAL FIXATION (ORIF) PROXIMAL HUMERUS FRACTURE;  Surgeon: Sheral Apley, MD;  Location: [facility] OR;  Service: Orthopedics;  Laterality: Right;     OB History   No obstetric history on file.     Family History  Problem Relation Age of Onset  . Stroke Mother   . Heart attack Mother   . Hypertension Mother   . Stroke Sister   . Stroke Brother   . Lung cancer Father     Social History   Tobacco Use  . Smoking status: Current Every Day Smoker    Packs/day: 1.00  . Smokeless tobacco: Never Used  Vaping Use  . Vaping Use: Never used  Substance Use Topics  . Alcohol use: No    Alcohol/week: 85.0 standard drinks    Types: 85  Shots of liquor per week    Comment: No alcohol use since 11/04/16.  . Drug use: No    Home Medications Prior to Admission medications   Medication Sig Start Date End Date Taking? Authorizing Provider  folic acid (FOLVITE) 1 MG tablet Take 1 tablet (1 mg total) by mouth daily. 09/09/19   Storm Frisk, MD  levETIRAcetam (KEPPRA) 500 MG tablet Take 1 tablet (500 mg total) by mouth 2 (two) times daily. 09/09/19   Storm Frisk, MD  methocarbamol (ROBAXIN) 500 MG tablet Take 1 tablet (500 mg total) by mouth every 8 (eight) hours as needed for muscle spasms. 09/09/19 12/08/19  Storm Frisk, MD  naproxen sodium (ALEVE) 220 MG tablet Take 440 mg by mouth 2 (two) times daily.    [provider]  senna-docusate (SENOKOT-S) 8.6-50 MG tablet Take 1 tablet by mouth 2 (two) times daily. 04/21/18   Regalado, Belkys A, MD  thiamine 100 MG tablet Take 1 tablet (100 mg total) by mouth daily. 09/09/19   Storm Frisk, MD    Allergies    Codeine  Review of Systems   Review of Systems  All other systems reviewed and are negative.   Physical Exam Updated Vital Signs BP 120/81 (BP Location: Left Arm)   Pulse 82   Temp 98 F (36.7  C) (Oral)   Resp 16   Ht 5\' 5"  (1.651 m)   Wt 58.3 kg   SpO2 95%   BMI 21.38 kg/m   Physical Exam Vitals and nursing note reviewed.  Constitutional:      Appearance: She is well-developed.  HENT:     Head: Normocephalic and atraumatic.  Cardiovascular:     Rate and Rhythm: Normal rate and regular rhythm.  Pulmonary:     Effort: No respiratory distress.     Breath sounds: No stridor.  Abdominal:     General: There is no distension.  Musculoskeletal:        General: Swelling ( Of her right upper extremity from the shoulder down just distal to the elbow. No significant pain with range of motion of the elbow. Has mild tenderness to palpation of the area. It is warm and red as well.) present.     Cervical back: Normal range of motion.   Neurological:     Mental Status: She is alert.     ED Results / Procedures / Treatments   Labs (all labs ordered are listed, but only abnormal results are displayed) Labs Reviewed  CBC WITH DIFFERENTIAL/PLATELET - Abnormal; Notable for the following components:      Result Value   RBC 5.21 (*)    Hemoglobin 16.3 (*)    HCT 51.1 (*)    All other components within normal limits  LACTIC ACID, PLASMA  COMPREHENSIVE METABOLIC PANEL  LACTIC ACID, PLASMA    EKG None  Radiology No results found.  Procedures Procedures (including critical care time)  Medications Ordered in ED Medications  sodium chloride flush (NS) 0.9 % injection 3 mL (has no administration in time range)  oxyCODONE-acetaminophen (PERCOCET/ROXICET) 5-325 MG per tablet 1 tablet (has no administration in time range)  Rivaroxaban (XARELTO) tablet 15 mg (has no administration in time range)  clindamycin (CLEOCIN) capsule 300 mg (has no administration in time range)    ED Course  I have reviewed the triage vital signs and the nursing notes.  Pertinent labs & imaging results that were available during my care of the patient were reviewed by me and considered in my medical decision making (see chart for details).    MDM Rules/Calculators/A&P                          Cellulitis versus DVT. First dose of antibiotics and anticoagulants given here. Will return tomorrow for ultrasound is to be do not have that available at this time. Prescriptions will be given for both with PCP follow-up to further manage either way. No cp/sob or vs abnormalities suggestign massive PE to require CT scan at this time.   Final Clinical Impression(s) / ED Diagnoses Final diagnoses:  None    Rx / DC Orders ED Discharge Orders    None       Yazhini Mcaulay, , MD 11/15/19 [phone]

## 2019-11-15 NOTE — Discharge Instructions (Signed)
Not sure if you have a blood clot or cellulitis. Please return to get your ultrasound tomorrow.   If they tell you that your ultrasound is positive for blood clot then please fill the prescription for the Xarelto and discard the prescription for clindamycin.   If your ultrasound is negative then fill the prescription for clindamycin and discard the prescription for Xarelto.   Either way you can take the pain medication and follow-up with your doctor in 3 to 4 days to make sure it is improving and to follow-up on results of the ultrasound and further management as needed. Return here for worsening symptoms.

## 2019-11-16 ENCOUNTER — Other Ambulatory Visit: Payer: Self-pay

## 2019-11-16 ENCOUNTER — Telehealth: Payer: Self-pay

## 2019-11-16 ENCOUNTER — Emergency Department (HOSPITAL_COMMUNITY)
Admission: EM | Admit: 2019-11-16 | Discharge: 2019-11-16 | Disposition: A | Discharge status: Home / Self Care | Payer: Self-pay | Attending: Emergency Medicine | Admitting: Emergency Medicine

## 2019-11-16 ENCOUNTER — Ambulatory Visit (HOSPITAL_COMMUNITY): Admission: RE | Admit: 2019-11-16 | Payer: Self-pay | Source: Ambulatory Visit

## 2019-11-16 ENCOUNTER — Encounter (HOSPITAL_COMMUNITY): Payer: Self-pay

## 2019-11-16 DIAGNOSIS — M79601 Pain in right arm: Secondary | ICD-10-CM | POA: Insufficient documentation

## 2019-11-16 DIAGNOSIS — Z5321 Procedure and treatment not carried out due to patient leaving prior to being seen by health care provider: Secondary | ICD-10-CM | POA: Insufficient documentation

## 2019-11-16 NOTE — ED Notes (Signed)
Pt eloped from waiting area. Called 3X.  

## 2019-11-16 NOTE — ED Triage Notes (Signed)
Patient reports that she has increased swelling and pain to the right upper arm. Patient was seen in the ED last night and states she was told to come back for an US of the right upper arm.

## 2019-11-17 ENCOUNTER — Ambulatory Visit (HOSPITAL_COMMUNITY)
Admission: RE | Admit: 2019-11-17 | Discharge: 2019-11-17 | Disposition: A | Discharge status: Home / Self Care | Payer: Self-pay | Source: Ambulatory Visit | Attending: Emergency Medicine | Admitting: Emergency Medicine

## 2019-11-17 DIAGNOSIS — M79621 Pain in right upper arm: Secondary | ICD-10-CM | POA: Insufficient documentation

## 2019-11-17 DIAGNOSIS — M7989 Other specified soft tissue disorders: Secondary | ICD-10-CM | POA: Insufficient documentation

## 2019-11-17 DIAGNOSIS — M79609 Pain in unspecified limb: Secondary | ICD-10-CM

## 2019-11-18 ENCOUNTER — Ambulatory Visit (HOSPITAL_COMMUNITY): Payer: Self-pay

## 2019-11-23 ENCOUNTER — Encounter: Payer: Self-pay | Admitting: Family Medicine

## 2019-12-02 ENCOUNTER — Ambulatory Visit: Payer: Self-pay | Attending: Critical Care Medicine

## 2019-12-02 DIAGNOSIS — Z23 Encounter for immunization: Secondary | ICD-10-CM

## 2019-12-02 NOTE — Progress Notes (Signed)
   Covid-19 Vaccination Clinic  Name:  Shaune Malacara    MRN: [id number] DOB: March 16, 1961  12/02/2019  Ms. Varnell was observed post Covid-19 immunization for 15 minutes without incident. She was provided with Vaccine Information Sheet and instruction to access the V-Safe system.   Ms. Fatica was instructed to call 911 with any severe reactions post vaccine: Marland Kitchen Difficulty breathing  . Swelling of face and throat  . A fast heartbeat  . A bad rash all over body  . Dizziness and weakness   Immunizations Administered    Name Date Dose VIS Date Route   Moderna COVID-19 Vaccine 12/02/2019  3:57 PM 0.5 mL 03/2019 Intramuscular   Manufacturer: Moderna   Lot: [id number]   NDC: 39767-341-93

## 2019-12-03 ENCOUNTER — Ambulatory Visit: Payer: Self-pay

## 2019-12-06 ENCOUNTER — Encounter (HOSPITAL_COMMUNITY): Payer: Self-pay | Admitting: Emergency Medicine

## 2019-12-06 ENCOUNTER — Other Ambulatory Visit: Payer: Self-pay

## 2019-12-06 ENCOUNTER — Emergency Department (HOSPITAL_COMMUNITY)
Admission: EM | Admit: 2019-12-06 | Discharge: 2019-12-06 | Disposition: A | Discharge status: Home / Self Care | Payer: Self-pay | Attending: Emergency Medicine | Admitting: Emergency Medicine

## 2019-12-06 DIAGNOSIS — Z79899 Other long term (current) drug therapy: Secondary | ICD-10-CM | POA: Insufficient documentation

## 2019-12-06 DIAGNOSIS — L539 Erythematous condition, unspecified: Secondary | ICD-10-CM | POA: Insufficient documentation

## 2019-12-06 DIAGNOSIS — F17210 Nicotine dependence, cigarettes, uncomplicated: Secondary | ICD-10-CM | POA: Insufficient documentation

## 2019-12-06 DIAGNOSIS — R2231 Localized swelling, mass and lump, right upper limb: Secondary | ICD-10-CM | POA: Insufficient documentation

## 2019-12-06 DIAGNOSIS — R238 Other skin changes: Secondary | ICD-10-CM

## 2019-12-06 DIAGNOSIS — I10 Essential (primary) hypertension: Secondary | ICD-10-CM | POA: Insufficient documentation

## 2019-12-06 MED ORDER — CLINDAMYCIN HCL 300 MG PO CAPS
300.0000 mg | ORAL_CAPSULE | Freq: Three times a day (TID) | ORAL | 0 refills | Status: AC
Start: 2019-12-06 — End: 2019-12-13

## 2019-12-06 NOTE — ED Triage Notes (Signed)
Pt states she received her 2nd COVID vaccine on Wednesday in R arm.  Reports redness and pain to R arm that started on Thursday.  States she had cellulitis to arm after her 1st COVID shot and believes she has it again.

## 2019-12-06 NOTE — ED Provider Notes (Signed)
[facility] EMERGENCY DEPARTMENT Provider Note   CSN: [id number] Arrival date & time: 12/06/19  1004     History Chief Complaint  Patient presents with  . Cellulitis    Rose Bridges is a 59 y.o. female presents to ER for concern of cellulitis in right arm after COVID vaccine on 8/4.  States the night after vaccine her arm became itchy. She couldn't help scratch at her arm. She has a scab over the injection site.  Has since developed redness, warmth, tenderness and mild swelling on the back of her arm that extends to elbow.  No pain with movement. Pain with palpation.  Has tried ibuprofen without relief.  Reports similar symptoms after her first dose of vaccine last month. She came to the ER and they did an arm ultrasound and she did not have a clot. She took antibiotics and the redness resolved.  Denies trauma. Denies distal arm tingling, numbness. No chest pain or shortness of breath. States she put a bandaid over the injection site to stop her from itching at it during the day.   HPI     Past Medical History:  Diagnosis Date  . Hyperlipemia   . Hypertension   . Osteogenesis imperfecta   . Osteoporosis     Patient Active Problem List   Diagnosis Date Noted  . Osteogenesis imperfecta 04/28/2018  . Seizure (HCC) 03/26/2017    Past Surgical History:  Procedure Laterality Date  . ABDOMINAL HYSTERECTOMY    . HIP ARTHROPLASTY Left 09/26/2015   Procedure: ARTHROPLASTY BIPOLAR HIP (HEMIARTHROPLASTY);  Surgeon: Sheral Apley, MD;  Location: [facility];  Service: Orthopedics;  Laterality: Left;  . ORIF HUMERUS FRACTURE Right 11/06/2016   Procedure: OPEN REDUCTION INTERNAL FIXATION (ORIF) PROXIMAL HUMERUS FRACTURE;  Surgeon: Sheral Apley, MD;  Location: [facility] OR;  Service: Orthopedics;  Laterality: Right;     OB History   No obstetric history on file.     Family History  Problem Relation Age of Onset  . Stroke Mother   . Heart attack Mother   .  Hypertension Mother   . Stroke Sister   . Stroke Brother   . Lung cancer Father     Social History   Tobacco Use  . Smoking status: Current Every Day Smoker    Packs/day: 0.25  . Smokeless tobacco: Never Used  Vaping Use  . Vaping Use: Never used  Substance Use Topics  . Alcohol use: No    Alcohol/week: 85.0 standard drinks    Types: 85 Shots of liquor per week    Comment: No alcohol use since 11/04/16.  . Drug use: No    Home Medications Prior to Admission medications   Medication Sig Start Date End Date Taking? Authorizing Provider  clindamycin (CLEOCIN) 300 MG capsule Take 1 capsule (300 mg total) by mouth 3 (three) times daily for 7 days. X 7 days 12/06/19 12/13/19  Liberty Handy, PA-C  folic acid (FOLVITE) 1 MG tablet Take 1 tablet (1 mg total) by mouth daily. 09/09/19   Storm Frisk, MD  levETIRAcetam (KEPPRA) 500 MG tablet Take 1 tablet (500 mg total) by mouth 2 (two) times daily. 09/09/19   Storm Frisk, MD  methocarbamol (ROBAXIN) 500 MG tablet Take 1 tablet (500 mg total) by mouth every 8 (eight) hours as needed for muscle spasms. 09/09/19 12/08/19  Storm Frisk, MD  naproxen sodium (ALEVE) 220 MG tablet Take 440 mg by mouth 2 (two) times daily.  [provider]  oxyCODONE-acetaminophen (PERCOCET) 5-325 MG tablet Take 1 tablet by mouth every 8 (eight) hours as needed for severe pain. 11/15/19   Mesner, Barbara Cower, MD  RIVAROXABAN Carlena Hurl) VTE STARTER PACK (15 & 20 MG TABLETS) Follow package directions: Take one 15mg  tablet by mouth twice a day. On day 22, switch to one 20mg  tablet once a day. Take with food. 11/15/19   Mesner, , MD  senna-docusate (SENOKOT-S) 8.6-50 MG tablet Take 1 tablet by mouth 2 (two) times daily. 04/21/18   Regalado, Belkys A, MD  thiamine 100 MG tablet Take 1 tablet (100 mg total) by mouth daily. 09/09/19   04/23/18, MD    Allergies    Codeine  Review of Systems   Review of Systems  Skin: Positive for color change  and wound.       Itching   All other systems reviewed and are negative.   Physical Exam Updated Vital Signs BP 107/84 (BP Location: Left Arm)   Pulse (!) 102   Temp 98.3 F (36.8 C) (Oral)   Resp 20   SpO2 97%   Physical Exam Constitutional:      Appearance: She is well-developed.  HENT:     Head: Normocephalic.     Nose: Nose normal.  Eyes:     General: Lids are normal.  Cardiovascular:     Rate and Rhythm: Normal rate.     Comments: 1+ radial pulse RLE  Pulmonary:     Effort: Pulmonary effort is normal.  Musculoskeletal:        General: Normal range of motion.     Cervical back: Normal range of motion.  Skin:    Findings: Erythema present.     Comments: Circular scab minimally non tender over right lateral deltoid at injection site.  No fluctuance, drainage, warmth.  Mild erythema, warmth, tenderness radiating down posteriorly from deltoid to lateral elbow.  Patient with full ROM of shoulder and elbow without pain. No abscess. Skin over this area has excoriations and dry skin.  Patient itching this area during exam.   Neurological:     Mental Status: She is alert.     Comments: Sensation and strength intact in RUE   Psychiatric:        Behavior: Behavior normal.     ED Results / Procedures / Treatments   Labs (all labs ordered are listed, but only abnormal results are displayed) Labs Reviewed - No data to display  EKG None  Radiology No results found.  Procedures Procedures (including critical care time)  Medications Ordered in ED Medications - No data to display  ED Course  I have reviewed the triage vital signs and the nursing notes.  Pertinent labs & imaging results that were available during my care of the patient were reviewed by me and considered in my medical decision making (see chart for details).    MDM Rules/Calculators/A&P                          EMR, triage and nursing notes reviewed to assist with history and MDM  Seen last month  for similar arm swelling after first COVID vaccine.  Had UE vascular 11/09/19 that did not show a blood clot.  Patient finished antibiotics and redness resolved.   Exam reveals skin erythema, edema, tenderness that is mild with origin around site of injection where patient has a scab. She is actively itching on exam.  Seems itching  began first and patient has been scratching. Suspect local injection site reaction initially with now mild superimposed cellulitis.  No tenderness medially on arm. Normal neurovascular status. Normal full ROM of joints without pain. No Cp, SOB. Doubt DVT. No evidence of abscess. Doubt septic arthritis.    Will dc with benadryl for itching, antibiotics, recheck in 48 hours.  Return precautions given.   Final Clinical Impression(s) / ED Diagnoses Final diagnoses:  Redness and swelling of upper arm    Rx / DC Orders ED Discharge Orders         Ordered    clindamycin (CLEOCIN) 300 MG capsule  3 times daily     Discontinue  Reprint     12/06/19 1321           Liberty Handy, [place] 12/06/19 1322    Margarita Grizzle, MD 12/06/19 [phone]

## 2019-12-06 NOTE — Discharge Instructions (Addendum)
Take over-the-counter 25 mg of diphenhydramine (Benadryl) every 6-8 hours to help with itching.  Your skin is dry in this area, this can cause more itching, recommend light lotion.  Can place hydrocortisone cream to help with itching.  Take antibiotic as prescribed.  You should notice mild improvement after 48 hours of antibiotics.  Please call primary care doctor for follow-up to make sure symptoms are improving.  Return to the ED for worsening or new symptoms.

## 2019-12-18 ENCOUNTER — Ambulatory Visit: Payer: Self-pay | Admitting: Neurology

## 2020-06-02 ENCOUNTER — Ambulatory Visit: Payer: Self-pay | Attending: Internal Medicine

## 2020-06-02 DIAGNOSIS — Z23 Encounter for immunization: Secondary | ICD-10-CM

## 2022-04-03 ENCOUNTER — Emergency Department (HOSPITAL_COMMUNITY): Payer: Medicare Other

## 2022-04-03 ENCOUNTER — Encounter (HOSPITAL_COMMUNITY): Payer: Self-pay | Admitting: Emergency Medicine

## 2022-04-03 ENCOUNTER — Other Ambulatory Visit (HOSPITAL_COMMUNITY): Payer: Medicare Other

## 2022-04-03 ENCOUNTER — Inpatient Hospital Stay (HOSPITAL_COMMUNITY)
Admission: EM | Admit: 2022-04-03 | Discharge: 2022-04-07 | DRG: 543 | Disposition: A | Discharge status: Skilled Nursing Facility | Payer: Medicare Other | Attending: Family Medicine | Admitting: Family Medicine

## 2022-04-03 DIAGNOSIS — W010XXA Fall on same level from slipping, tripping and stumbling without subsequent striking against object, initial encounter: Secondary | ICD-10-CM | POA: Diagnosis present

## 2022-04-03 DIAGNOSIS — E785 Hyperlipidemia, unspecified: Secondary | ICD-10-CM | POA: Diagnosis present

## 2022-04-03 DIAGNOSIS — Z8781 Personal history of (healed) traumatic fracture: Secondary | ICD-10-CM

## 2022-04-03 DIAGNOSIS — Z8249 Family history of ischemic heart disease and other diseases of the circulatory system: Secondary | ICD-10-CM

## 2022-04-03 DIAGNOSIS — Z801 Family history of malignant neoplasm of trachea, bronchus and lung: Secondary | ICD-10-CM

## 2022-04-03 DIAGNOSIS — S32810A Multiple fractures of pelvis with stable disruption of pelvic ring, initial encounter for closed fracture: Principal | ICD-10-CM

## 2022-04-03 DIAGNOSIS — N390 Urinary tract infection, site not specified: Secondary | ICD-10-CM | POA: Diagnosis present

## 2022-04-03 DIAGNOSIS — I10 Essential (primary) hypertension: Secondary | ICD-10-CM | POA: Diagnosis present

## 2022-04-03 DIAGNOSIS — Z823 Family history of stroke: Secondary | ICD-10-CM

## 2022-04-03 DIAGNOSIS — S329XXA Fracture of unspecified parts of lumbosacral spine and pelvis, initial encounter for closed fracture: Secondary | ICD-10-CM | POA: Diagnosis present

## 2022-04-03 DIAGNOSIS — Z90710 Acquired absence of both cervix and uterus: Secondary | ICD-10-CM

## 2022-04-03 DIAGNOSIS — Q780 Osteogenesis imperfecta: Secondary | ICD-10-CM

## 2022-04-03 DIAGNOSIS — Z96642 Presence of left artificial hip joint: Secondary | ICD-10-CM | POA: Diagnosis present

## 2022-04-03 DIAGNOSIS — Z885 Allergy status to narcotic agent status: Secondary | ICD-10-CM

## 2022-04-03 DIAGNOSIS — Z716 Tobacco abuse counseling: Secondary | ICD-10-CM

## 2022-04-03 DIAGNOSIS — M808B2A Other osteoporosis with current pathological fracture, left pelvis, initial encounter for fracture: Principal | ICD-10-CM | POA: Diagnosis present

## 2022-04-03 DIAGNOSIS — M808B1A Other osteoporosis with current pathological fracture, right pelvis, initial encounter for fracture: Secondary | ICD-10-CM | POA: Diagnosis present

## 2022-04-03 DIAGNOSIS — R350 Frequency of micturition: Secondary | ICD-10-CM

## 2022-04-03 DIAGNOSIS — G40909 Epilepsy, unspecified, not intractable, without status epilepticus: Secondary | ICD-10-CM | POA: Diagnosis present

## 2022-04-03 DIAGNOSIS — F17210 Nicotine dependence, cigarettes, uncomplicated: Secondary | ICD-10-CM | POA: Diagnosis present

## 2022-04-03 DIAGNOSIS — Z720 Tobacco use: Secondary | ICD-10-CM | POA: Insufficient documentation

## 2022-04-03 DIAGNOSIS — D751 Secondary polycythemia: Secondary | ICD-10-CM | POA: Diagnosis present

## 2022-04-03 DIAGNOSIS — I700 Atherosclerosis of aorta: Secondary | ICD-10-CM | POA: Diagnosis present

## 2022-04-03 DIAGNOSIS — Z79899 Other long term (current) drug therapy: Secondary | ICD-10-CM

## 2022-04-03 MED ORDER — LIDOCAINE 5 % EX PTCH
1.0000 | MEDICATED_PATCH | CUTANEOUS | Status: DC
  Administered 2022-04-03 – 2022-04-07 (×5): 1 via TRANSDERMAL
  Filled 2022-04-03 (×5): qty 1

## 2022-04-03 MED ORDER — POLYETHYLENE GLYCOL 3350 17 G PO PACK
17.0000 g | PACK | Freq: Every day | ORAL | Status: DC
  Administered 2022-04-04 – 2022-04-06 (×3): 17 g via ORAL
  Filled 2022-04-03 (×4): qty 1

## 2022-04-03 MED ORDER — ACETAMINOPHEN 325 MG PO TABS
650.0000 mg | ORAL_TABLET | Freq: Four times a day (QID) | ORAL | Status: DC
  Administered 2022-04-04 – 2022-04-07 (×14): 650 mg via ORAL
  Filled 2022-04-03 (×16): qty 2

## 2022-04-03 MED ORDER — ENOXAPARIN SODIUM 40 MG/0.4ML IJ SOSY
40.0000 mg | PREFILLED_SYRINGE | INTRAMUSCULAR | Status: DC
  Administered 2022-04-04 – 2022-04-07 (×4): 40 mg via SUBCUTANEOUS
  Filled 2022-04-03 (×4): qty 0.4

## 2022-04-03 MED ORDER — THIAMINE HCL 100 MG PO TABS
100.0000 mg | ORAL_TABLET | Freq: Every day | ORAL | Status: DC
  Administered 2022-04-04 – 2022-04-07 (×4): 100 mg via ORAL
  Filled 2022-04-03 (×8): qty 1

## 2022-04-03 MED ORDER — SENNOSIDES-DOCUSATE SODIUM 8.6-50 MG PO TABS
1.0000 | ORAL_TABLET | Freq: Two times a day (BID) | ORAL | Status: DC
  Administered 2022-04-04 – 2022-04-06 (×5): 1 via ORAL
  Filled 2022-04-03 (×7): qty 1

## 2022-04-03 MED ORDER — OXYCODONE HCL 5 MG PO TABS
5.0000 mg | ORAL_TABLET | ORAL | Status: AC
  Administered 2022-04-03: 5 mg via ORAL
  Filled 2022-04-03: qty 1

## 2022-04-03 MED ORDER — OXYCODONE HCL 5 MG PO TABS
5.0000 mg | ORAL_TABLET | ORAL | Status: DC | PRN
  Administered 2022-04-04 – 2022-04-07 (×12): 5 mg via ORAL
  Filled 2022-04-03 (×13): qty 1

## 2022-04-03 MED ORDER — SODIUM CHLORIDE 0.9 % IV SOLN: INTRAVENOUS | Status: DC

## 2022-04-03 MED ORDER — FOLIC ACID 1 MG PO TABS
1.0000 mg | ORAL_TABLET | Freq: Every day | ORAL | Status: DC
  Administered 2022-04-04 – 2022-04-07 (×4): 1 mg via ORAL
  Filled 2022-04-03 (×4): qty 1

## 2022-04-03 MED ORDER — ACETAMINOPHEN 500 MG PO TABS
1000.0000 mg | ORAL_TABLET | ORAL | Status: AC
  Administered 2022-04-03: 1000 mg via ORAL
  Filled 2022-04-03: qty 2

## 2022-04-03 NOTE — ED Provider Triage Note (Signed)
Emergency Medicine Provider Triage Evaluation Note  Rose Bridges , a 61 y.o. female  was evaluated in triage.  Pt complains of fall that occurred on Thursday.  She has been dealing with balance issues for a year now leading to the fall. She says that she landed on her right hip and back.  She has been unable to stand up without assistance since then.  She also is complaining of frequent urination.   Review of Systems  Positive:  Negative:   Physical Exam  BP (!) 138/101 (BP Location: Right Arm)   Pulse 94   Temp 97.9 F (36.6 C) (Oral)   Resp 17   SpO2 98%  Gen:   Awake, no distress   Resp:  Normal effort  MSK:   Moves extremities without difficulty  Other:    Medical Decision Making  Medically screening exam initiated at 11:37 AM.  Appropriate orders placed.  Hanaan Gancarz was informed that the remainder of the evaluation will be completed by another provider, this initial triage assessment does not replace that evaluation, and the importance of remaining in the ED until their evaluation is complete.     Claudie Leach, [place] 04/03/22 1138

## 2022-04-03 NOTE — ED Triage Notes (Signed)
Pt arrives via EMS from home with reports of a fall on Friday. Pt has been laying on couch since injury. Endorses frequent urination with foul odor. Still having pain to right hip.

## 2022-04-03 NOTE — Assessment & Plan Note (Addendum)
Pain is improved. Unable to ambulate independently.  - Orthopedics consulted; recommending non-operative management - Continued PT until SNF placement - Vitamin D supplementation - Continue Pain management:  Tylenol 650mg  q6h sch Lidocaine patch BID Oxycodone 5mg  q4h prn breakthrough pain Intranasal Calcitonin - Bowel regimen due to narcotic use: Miralax and home med Senna

## 2022-04-03 NOTE — H&P (Incomplete)
Hospital Admission History and Physical Service Pager: [phone]  Patient name: Rose Bridges Medical record number: [id number] Date of Birth: 1960-07-20 Age: 61 y.o. Gender: female  Primary Care Provider: Cain Saupe, MD Consultants: Orthopedics Code Status: Full code Preferred Emergency Contact:  Contact Information     Name Relation Home Work [place] Relative [phone]      Chief Complaint: Fall  Assessment and Plan: Rose Bridges is a 61 y.o. female presenting with mechanical fall resulting in imaging confirmed pelvic fracture. PMHx includes osteogenesis imperfecta, osteoporosis, HTN, HLD and seizure disorder.  * Pelvic fracture (HCC) Mechanical fall on 12/2 resulting in subtle, nondisplaced fx of R ala and L symphysis pubis. Patient unable to bear weight. PMHx osteogenesis imperfect and osteoporosis with h/o multiple fractures including prior pelvic fracture in 2019. At baseline uses a Rolator walker and conducts ADLs. Per orthopedics, non-operative management at this time but requested admission since she is unable to ambulate. -Admitted to FMTS service, med surg, Dr. Manson Passey attending -Orthopedics consulted, appreciate recommendations -Pain management: Tylenol 650mg  q6h sch, Oxycodone 5mg  q4h prn breakthrough pain -Bowel regimen due to narcotic use: Miralax and home med Senna -3/4 MIVF given poor PO intake due to pain -PT/OT consulted -TOC consulted for likely SNF placement  Urinary frequency Urinary frequency x 2 days without other symptoms. -UA pending  Tobacco use Current smoker with likely 30+ year pack history. Reports prior nicotine replacements had unfavorable side effects. -Declined nicotine patch -Advised smoking cessation   Chronic conditions: Seizure disorder: consider restarting home Keppra 500mg  BID (patient has not taken in months). Waiting for labs to assess kidney function to ensure it does not need to be renally  adjusted Osteogenesis imperfecta: restart thiamine and folate  FEN/GI: Soft diet due to missing teeth VTE Prophylaxis: Lovenox  Disposition: Med surg  History of Present Illness:  Rose Bridges is a 61 y.o. female presenting with mechanical fall that occurred on 12/2. States she was using her rollator and trying to adjust the thermostat and lost her balance and fell directly on her pelvic bone/back. Denies LOC or hitting her head. Felt immediate pain after fall but tried to continue with daily life. States the pain was unbearable and she could not ambulate in her home since she has stairs. Endorses history of prior fractures with falls given PMHx of osteogenesis imperfect and osteoporosis. Conducts ADLs with rollator assistance at baseline. Has been taking Aleve for the pain.  Patient also endorses urinary frequency and incontinence for the past 2 days. Denies dysuria or other symptoms. Last BM a few days ago. States she ate and drank less after the fall due to inability to ambulate. Has not taken any other medications, including her seizure med, for months.  In the ED, patient had imaging that confirmed subtle, nondisplaced fx of R ala and L symphysis pubis. Lumbar CT showed chronic changes without acute fracture. Orthopedics were consulted and recommended non-operative management at this time. Attempts to ambulate patient were unsuccessful. Pain controlled with Tylenol and Oxycodone.  Review Of Systems: Per HPI with the following additions: back pain, pelvic pain, urinary incontinence  Pertinent Past Medical History: Osteoporosis Osteogenesis imperfecta HLD HTN Seizure Remainder reviewed in history tab.   Pertinent Past Surgical History: Left hip arthoplasty (08/2015) ORIF humerus fx (10/2016) Remainder reviewed in history tab.   Pertinent Social History: Tobacco use: Yes, 4-5 cigarette/day Alcohol use: No. H/o alcohol use disorder but stopped in 2019 Other Substance use:  No Lives with  elderly mother  Pertinent Family History: Father: osteogenesis imperfecta  Remainder reviewed in history tab.   Important Outpatient Medications: Keppra 500mg  BID Folic acid 1mg  Thiamine 100mg  Remainder reviewed in medication history.   Objective: BP (!) 155/88 (BP Location: Right Arm)   Pulse 76   Temp 98 F (36.7 C) (Oral)   Resp 18   Ht 5\' 4"  (1.626 m)   Wt 58 kg   SpO2 95%   BMI 21.95 kg/m  Exam: General: Alert, NAD Eyes: White sclera.  ENTM: Protruding mandible. Missing upper teeth. Neck: Supple. Full ROM Cardiovascular: RRR without murmur Respiratory: CTAB. Normal WOB on RA Gastrointestinal: Soft, non-tender, non-distended. +BS MSK: 3/5 strength in L hip flexion. 4/5 strength in R hip flexion. 5/5 strength in dorsiflexion and plantarflexion bilaterally. 2+ dorsalis pedis pulses. Sensation intact.  Derm: Warm, dry. No rashes noted Neuro: Motor and sensation intact globally. Unable to ambulate. Psych: Cooperative, participates in exam  Labs:  Pending collection of CBC, CMP and UA CBC BMET  No results for input(s): "WBC", "HGB", "HCT", "PLT" in the last 168 hours. No results for input(s): "NA", "K", "CL", "CO2", "BUN", "CREATININE", "GLUCOSE", "CALCIUM" in the last 168 hours.    Imaging Studies Performed: CT PELVIS WO CONTRAST  Result Date: 04/03/2022 IMPRESSION: 1. Subtle fracture involving the right sacral ala without significant displacement. Possible subtle fracture along the left side of the symphysis pubis joint. 2. 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 3. Aortic atherosclerosis. Aortic Atherosclerosis (ICD10-I70.0).  CT Lumbar Spine Wo Contrast  Result Date: 04/03/2022 IMPRESSION: 1. Compression deformities of L1 and L3 appear to be remote. No paraspinal hematoma to suggest an acute fracture. 2. Age advanced osteopenia. 3. Mild spinal and bilateral lateral recess stenosis at L2-3 and L3-4.  DG Lumbar Spine  Complete  Result Date: 04/03/2022 IMPRESSION: L1 and L3 compression deformities are noted most consistent with old fractures. If patient is symptomatic over this area, MRI may be performed to evaluate for the possibility of acute fracture.   DG Hip Unilat With Pelvis 2-3 Views Right  Result Date: 04/03/2022 IMPRESSION: Negative.     14/08/2021 MD 04/04/2022, 12:36 AM PGY-1, [facility]  FPTS Intern pager: [phone], text pages welcome Secure chat group [facility] Teaching Service     FPTS Upper-Level Resident Addendum   I have independently interviewed and examined the patient. I have discussed the above with the original author and agree with their documentation. My edits for correction/addition/clarification are included. Please see also any attending notes.   14/09/2021, D.O. PGY-3, [facility] 04/04/2022 12:36 AM  FPTS Service pager: [phone] (text pages welcome through [facility])

## 2022-04-03 NOTE — ED Notes (Signed)
Nurse attempted to ambulate patient patient states she is unable to. She also states she dribbles to urinate and does not on command.

## 2022-04-03 NOTE — Hospital Course (Addendum)
Rose Bridges is a 61 y.o. female with past medical history of osteogenesis imperfecta, osteoporosis, HTN, HLD, and seizure disorder presented to the ED after a mechanical fall on 12/2 resulting in imaging confirmed pelvic fracture. Her hospital course is outlined below:  Pelvis Fracture Patient sustained a mechanical fall on 12/2 in her home, resulting in a subtle, nondisplaced fx of R ala and L symphysis pubis. Was unable to ambulate with her Rolator walker at home due to pain and continues to have difficulty doing so. Ortho recommended non-operative management and advised PT/OT which recommended SNF placement. Pain controlled throughout hospital course with tylenol, oxycodone, and intranasal calcitonin and discharged to SNF for rehab.  Intranasal calcitonin was continued for total treatment of 4 weeks  Urinary Frequency 2 days of increased urinary frequency, increased urge, and no dysuria upon admission. UA showed possible UTI.  Culture result multiple species likely contaminated catch.  Patient asymptomatic no further treatment was pursued.  PCP Follow-Up: Consider bisphosphonate therapy outpatient. Discontinue intranasal calcitonin after total 4 weeks of treatment (05/02/2022)

## 2022-04-03 NOTE — ED Provider Notes (Signed)
[facility] EMERGENCY DEPARTMENT Provider Note   CSN: [id number] Arrival date & time: 04/03/22  1108     History {Add pertinent medical, surgical, social history, OB history to HPI:1} Chief Complaint  Patient presents with   Fall   Urinary Tract Infection    Rose Bridges is a 61 y.o. female.  Chemical fall reaching for thermostat on Friday was tried walking with a rollator but has not been able to and has had increased urinary frequency       Home Medications Prior to Admission medications   Medication Sig Start Date End Date Taking? Authorizing Provider  folic acid (FOLVITE) 1 MG tablet Take 1 tablet (1 mg total) by mouth daily. 09/09/19   Storm Frisk, MD  levETIRAcetam (KEPPRA) 500 MG tablet Take 1 tablet (500 mg total) by mouth 2 (two) times daily. 09/09/19   Storm Frisk, MD  naproxen sodium (ALEVE) 220 MG tablet Take 440 mg by mouth 2 (two) times daily.    [provider]  oxyCODONE-acetaminophen (PERCOCET) 5-325 MG tablet Take 1 tablet by mouth every 8 (eight) hours as needed for severe pain. 11/15/19   Mesner, Barbara Cower, MD  RIVAROXABAN Carlena Hurl) VTE STARTER PACK (15 & 20 MG TABLETS) Follow package directions: Take one 15mg  tablet by mouth twice a day. On day 22, switch to one 20mg  tablet once a day. Take with food. 11/15/19   Mesner, , MD  senna-docusate (SENOKOT-S) 8.6-50 MG tablet Take 1 tablet by mouth 2 (two) times daily. 04/21/18   Regalado, Belkys A, MD  thiamine 100 MG tablet Take 1 tablet (100 mg total) by mouth daily. 09/09/19   04/23/18, MD      Allergies    Codeine    Review of Systems   Review of Systems  Physical Exam Updated Vital Signs BP 118/80 (BP Location: Right Arm)   Pulse 92   Temp 98.5 F (36.9 C) (Oral)   Resp 16   Ht 5\' 4"  (1.626 m)   Wt 58 kg   SpO2 99%   BMI 21.95 kg/m  Physical Exam  ED Results / Procedures / Treatments   Labs (all labs ordered are listed, but only abnormal  results are displayed) Labs Reviewed  URINALYSIS, ROUTINE W REFLEX MICROSCOPIC    EKG None  Radiology CT PELVIS WO CONTRAST  Result Date: 04/03/2022 CLINICAL DATA:  Fall.  Possible hip fracture. EXAM: CT PELVIS WITHOUT CONTRAST TECHNIQUE: Multidetector CT imaging of the pelvis was performed following the standard protocol without intravenous contrast. RADIATION DOSE REDUCTION: This exam was performed according to the departmental dose-optimization program which includes automated exposure control, adjustment of the mA and/or kV according to patient size and/or use of iterative reconstruction technique. COMPARISON:  CT 04/19/2018. FINDINGS: Urinary Tract: Bladder is unremarkable. Visualized ureters are within normal. Bowel: Appendix is normal. Visualized small bowel is normal. There is moderate fecal retention over the rectosigmoid colon. Vascular/Lymphatic: Minimal calcified plaque at the aortic bifurcation. Visualized aorta is normal in caliber. Mild symmetric calcified plaque over the femoral arteries bilaterally. Remaining vascular structures are unremarkable. Reproductive:  Previous hysterectomy. Other:  No free fluid or inflammatory change. Musculoskeletal: There is a subtle fracture involving the right sacral ala without significant displacement. Possible subtle fracture along the left side of the symphysis pubis joint. 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 Remaining bony structures unremarkable. IMPRESSION: 1. Subtle fracture involving the right sacral ala without significant displacement. Possible subtle fracture along the left side  of the symphysis pubis joint. 2. 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 3. Aortic atherosclerosis. Aortic Atherosclerosis (ICD10-I70.0). Electronically Signed   By: Elberta Fortis M.D.   On: 04/03/2022 15:05   CT Lumbar Spine Wo Contrast  Result Date: 04/03/2022 CLINICAL DATA:  Larey Seat. Back pain. EXAM: CT LUMBAR SPINE WITHOUT  CONTRAST TECHNIQUE: Multidetector CT imaging of the lumbar spine was performed without intravenous contrast administration. Multiplanar CT image reconstructions were also generated. RADIATION DOSE REDUCTION: This exam was performed according to the departmental dose-optimization program which includes automated exposure control, adjustment of the mA and/or kV according to patient size and/or use of iterative reconstruction technique. COMPARISON:  Lumbar radiographs, same date. FINDINGS: Segmentation: There are five lumbar type vertebral bodies. The last full intervertebral disc space is labeled L5-S1. Alignment: Normal Vertebrae: Age advanced osteopenia. Compression deformities of L1 and L3 appear to be remote. No paraspinal hematoma to suggest an acute fracture. Paraspinal and other soft tissues: No significant paraspinal or retroperitoneal findings. Scattered vascular calcifications. Disc levels: T12-L1 no significant findings. L1-2: No significant findings. L2-3: Mild diffuse annular bulge with mild spinal and bilateral lateral recess stenosis. No foraminal stenosis. L3-4: Mild bulging annulus, facet disease and ligamentum flavum thickening contributing to mild spinal and bilateral lateral recess stenosis. No foraminal stenosis. L4-5: Mild facet disease but no disc protrusions, spinal or foraminal stenosis. L5-S1: Moderate facet disease but no disc protrusions, spinal or foraminal stenosis. IMPRESSION: 1. Compression deformities of L1 and L3 appear to be remote. No paraspinal hematoma to suggest an acute fracture. 2. Age advanced osteopenia. 3. Mild spinal and bilateral lateral recess stenosis at L2-3 and L3-4. Electronically Signed   By: Rudie Meyer M.D.   On: 04/03/2022 15:03   DG Lumbar Spine Complete  Result Date: 04/03/2022 CLINICAL DATA:  Lower back pain after fall. EXAM: LUMBAR SPINE - COMPLETE 4+ VIEW COMPARISON:  None Available. FINDINGS: Mild compression deformity of L1 vertebral body is noted  most consistent with old fracture. Moderate compression deformity of L3 vertebral body is noted most consistent with old fracture. No spondylolisthesis is noted. Disc spaces are well-maintained. IMPRESSION: L1 and L3 compression deformities are noted most consistent with old fractures. If patient is symptomatic over this area, MRI may be performed to evaluate for the possibility of acute fracture. Electronically Signed   By: Lupita Raider M.D.   On: 04/03/2022 12:49   DG Hip Unilat With Pelvis 2-3 Views Right  Result Date: 04/03/2022 CLINICAL DATA:  Right hip pain after fall. EXAM: DG HIP (WITH OR WITHOUT PELVIS) 2-3V RIGHT COMPARISON:  May 07, 2018. FINDINGS: There is no evidence of hip fracture or dislocation. There is no evidence of arthropathy or other focal bone abnormality. IMPRESSION: Negative. Electronically Signed   By: Lupita Raider M.D.   On: 04/03/2022 12:46    Procedures Procedures  {Document cardiac monitor, telemetry assessment procedure when appropriate:1}  Medications Ordered in ED Medications - No data to display  ED Course/ Medical Decision Making/ A&P Clinical Course as of 04/03/22 1940  Tue Apr 03, 2022  1747 CT with R sacral ala and possible pubic symphysis joint on R. Chronic L1 and L3 compression fractures.  [RP]    Clinical Course User Index [RP] Rondel Baton, MD                           Medical Decision Making  ***  {Document critical care time when appropriate:1} {  Document review of labs and clinical decision tools ie heart score, Chads2Vasc2 etc:1}  {Document your independent review of radiology images, and any outside records:1} {Document your discussion with family members, caretakers, and with consultants:1} {Document social determinants of health affecting pt's care:1} {Document your decision making why or why not admission, treatments were needed:1} Final Clinical Impression(s) / ED Diagnoses Final diagnoses:  None    Rx / DC  Orders ED Discharge Orders     None

## 2022-04-03 NOTE — H&P (Incomplete)
Hospital Admission History and Physical Service Pager: [phone]  Patient name: Rose Bridges Medical record number: [id number] Date of Birth: November 02, 1960 Age: 61 y.o. Gender: female  Primary Care Provider: Cain Saupe, MD Consultants: Orthopedics Code Status: Full  Preferred Emergency Contact:  Contact Information     Name Relation Home Work [place] Relative [phone]      Chief Complaint: Fall  Assessment and Plan: Rose Bridges is a 61 y.o. female presenting with mechanical fall resulting in imaging confirmed pelvic fracture. PMHx includes osteogenesis imperfecta, osteoporosis, HTN, HLD and seizure disorder.  * Pelvic fracture (HCC) Mechanical fall on 12/2 resulting in subtle, nondisplaced fx of R ala and L symphysis pubis. Patient unable to bear weight. PMHx osteogenesis imperfect and osteoporosis with h/o multiple fractures including prior pelvic fracture in 2020. Per orthopedics, non-operative management at this time. -Admitted to FMTS service, Dr. Manson Passey attending -Orthopedics consulted, appreciate recommendations -Pain management: Tylenol 650mg  q6h sch, Oxycodone 5mg  q4h prn breakthrough pain -PT/OT consulted -TOC consulted for likely SNF placement    FEN/GI: Soft diet VTE Prophylaxis: Lovenox  Disposition: Med surg  History of Present Illness:  Rose Bridges is a 61 y.o. female presenting with ***  Patient states on Saturday morning she was using her rollator and trying to adjust the thermostat and lost her balance and fell directly on her pelvic bone/back. Denies LOC or hitting her head. Felt immediate pain after fall and due to inability to ambulate came to ED. Also endorses urinary frequency for the past 2 days. Denies dysuria or other symptoms  Mom lives downstairs and has a helper but does not really help patient. Typically able to perform ADLs   Endorses history of osteogenesis imperfecta inherited from her fathers side-  father and granddaughter both have it.  Only medication is Aleve. Has not take other meds including her seizure med for months  In the ED, ***  Review Of Systems: Per HPI with the following additions: back pain, pelvic pain  Pertinent Past Medical History: Osteoporosis Osteogenesis imperfecta HLD HTN Seizure Remainder reviewed in history tab.   Pertinent Past Surgical History: Left hip arthoplasty (08/2015) ORIF humerus fx (10/2016) Remainder reviewed in history tab.   Pertinent Social History: Tobacco use: Yes, 4-5 cigarette/day Alcohol use: No. H/o alcohol use disorder but stopped in 2019 Other Substance use: No Lives with elderly mother  Pertinent Family History: Father: osteogenesis imperfecta  Remainder reviewed in history tab.   Important Outpatient Medications: Keppra 500mg  BID Folic acid 1mg  Thiamine 100mg  Remainder reviewed in medication history.   Objective: BP (!) 155/88 (BP Location: Right Arm)   Pulse 76   Temp 98 F (36.7 C) (Oral)   Resp 18   Ht 5\' 4"  (1.626 m)   Wt 58 kg   SpO2 95%   BMI 21.95 kg/m  Exam: General: *** Eyes: *** ENTM: *** Neck: *** Cardiovascular: *** Respiratory: *** Gastrointestinal: *** MSK: *** Derm: *** Neuro: *** Psych: ***  Labs:  Pending collection of CBC, CMP and UA CBC BMET  No results for input(s): "WBC", "HGB", "HCT", "PLT" in the last 168 hours. No results for input(s): "NA", "K", "CL", "CO2", "BUN", "CREATININE", "GLUCOSE", "CALCIUM" in the last 168 hours.    Imaging Studies Performed: CT PELVIS WO CONTRAST  Result Date: 04/03/2022 IMPRESSION: 1. Subtle fracture involving the right sacral ala without significant displacement. Possible subtle fracture along the left side of the symphysis pubis joint. 2. 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  3. Aortic atherosclerosis. Aortic Atherosclerosis (ICD10-I70.0).  CT Lumbar Spine Wo Contrast  Result Date: 04/03/2022 IMPRESSION: 1.  Compression deformities of L1 and L3 appear to be remote. No paraspinal hematoma to suggest an acute fracture. 2. Age advanced osteopenia. 3. Mild spinal and bilateral lateral recess stenosis at L2-3 and L3-4.  DG Lumbar Spine Complete  Result Date: 04/03/2022 IMPRESSION: L1 and L3 compression deformities are noted most consistent with old fractures. If patient is symptomatic over this area, MRI may be performed to evaluate for the possibility of acute fracture.   DG Hip Unilat With Pelvis 2-3 Views Right  Result Date: 04/03/2022 IMPRESSION: Negative.      Elberta Fortis, MD 04/03/2022, 11:58 PM PGY-1, [facility]  FPTS Intern pager: [phone], text pages welcome Secure chat group [facility] Teaching Service

## 2022-04-04 ENCOUNTER — Other Ambulatory Visit: Payer: Self-pay

## 2022-04-04 DIAGNOSIS — E785 Hyperlipidemia, unspecified: Secondary | ICD-10-CM | POA: Diagnosis present

## 2022-04-04 DIAGNOSIS — Z801 Family history of malignant neoplasm of trachea, bronchus and lung: Secondary | ICD-10-CM | POA: Diagnosis not present

## 2022-04-04 DIAGNOSIS — Z79899 Other long term (current) drug therapy: Secondary | ICD-10-CM | POA: Diagnosis not present

## 2022-04-04 DIAGNOSIS — Z823 Family history of stroke: Secondary | ICD-10-CM | POA: Diagnosis not present

## 2022-04-04 DIAGNOSIS — S3282XA Multiple fractures of pelvis without disruption of pelvic ring, initial encounter for closed fracture: Secondary | ICD-10-CM

## 2022-04-04 DIAGNOSIS — Z8781 Personal history of (healed) traumatic fracture: Secondary | ICD-10-CM | POA: Diagnosis not present

## 2022-04-04 DIAGNOSIS — Z90710 Acquired absence of both cervix and uterus: Secondary | ICD-10-CM | POA: Diagnosis not present

## 2022-04-04 DIAGNOSIS — I700 Atherosclerosis of aorta: Secondary | ICD-10-CM | POA: Diagnosis present

## 2022-04-04 DIAGNOSIS — D751 Secondary polycythemia: Secondary | ICD-10-CM | POA: Diagnosis present

## 2022-04-04 DIAGNOSIS — M808B2A Other osteoporosis with current pathological fracture, left pelvis, initial encounter for fracture: Secondary | ICD-10-CM | POA: Diagnosis present

## 2022-04-04 DIAGNOSIS — G40909 Epilepsy, unspecified, not intractable, without status epilepticus: Secondary | ICD-10-CM | POA: Diagnosis present

## 2022-04-04 DIAGNOSIS — Z96642 Presence of left artificial hip joint: Secondary | ICD-10-CM | POA: Diagnosis present

## 2022-04-04 DIAGNOSIS — F17210 Nicotine dependence, cigarettes, uncomplicated: Secondary | ICD-10-CM | POA: Diagnosis present

## 2022-04-04 DIAGNOSIS — Z8249 Family history of ischemic heart disease and other diseases of the circulatory system: Secondary | ICD-10-CM | POA: Diagnosis not present

## 2022-04-04 DIAGNOSIS — S32810A Multiple fractures of pelvis with stable disruption of pelvic ring, initial encounter for closed fracture: Secondary | ICD-10-CM

## 2022-04-04 DIAGNOSIS — M808B1A Other osteoporosis with current pathological fracture, right pelvis, initial encounter for fracture: Secondary | ICD-10-CM | POA: Diagnosis present

## 2022-04-04 DIAGNOSIS — R350 Frequency of micturition: Secondary | ICD-10-CM

## 2022-04-04 DIAGNOSIS — I10 Essential (primary) hypertension: Secondary | ICD-10-CM | POA: Diagnosis present

## 2022-04-04 DIAGNOSIS — Z716 Tobacco abuse counseling: Secondary | ICD-10-CM | POA: Diagnosis not present

## 2022-04-04 DIAGNOSIS — Q780 Osteogenesis imperfecta: Secondary | ICD-10-CM | POA: Diagnosis not present

## 2022-04-04 DIAGNOSIS — W010XXA Fall on same level from slipping, tripping and stumbling without subsequent striking against object, initial encounter: Secondary | ICD-10-CM | POA: Diagnosis present

## 2022-04-04 DIAGNOSIS — N390 Urinary tract infection, site not specified: Secondary | ICD-10-CM | POA: Diagnosis present

## 2022-04-04 DIAGNOSIS — Z885 Allergy status to narcotic agent status: Secondary | ICD-10-CM | POA: Diagnosis not present

## 2022-04-04 LAB — URINALYSIS, ROUTINE W REFLEX MICROSCOPIC
Bilirubin Urine: NEGATIVE
Glucose, UA: NEGATIVE mg/dL
Hgb urine dipstick: NEGATIVE
Ketones, ur: 80 mg/dL — AB
Nitrite: NEGATIVE
Protein, ur: NEGATIVE mg/dL
Specific Gravity, Urine: 1.024 (ref 1.005–1.030)
pH: 5 (ref 5.0–8.0)

## 2022-04-04 LAB — COMPREHENSIVE METABOLIC PANEL
ALT: 15 U/L (ref 0–44)
AST: 28 U/L (ref 15–41)
Albumin: 3 g/dL — ABNORMAL LOW (ref 3.5–5.0)
Alkaline Phosphatase: 96 U/L (ref 38–126)
Anion gap: 17 — ABNORMAL HIGH (ref 5–15)
BUN: 19 mg/dL (ref 8–23)
CO2: 23 mmol/L (ref 22–32)
Calcium: 9.8 mg/dL (ref 8.9–10.3)
Chloride: 102 mmol/L (ref 98–111)
Creatinine, Ser: 0.96 mg/dL (ref 0.44–1.00)
GFR, Estimated: >60 mL/min (ref >60)
Glucose, Bld: 68 mg/dL — ABNORMAL LOW (ref 70–99)
Potassium: 4.3 mmol/L (ref 3.5–5.1)
Sodium: 142 mmol/L (ref 135–145)
Total Bilirubin: 1.7 mg/dL — ABNORMAL HIGH (ref 0.3–1.2)
Total Protein: 6.1 g/dL — ABNORMAL LOW (ref 6.5–8.1)

## 2022-04-04 LAB — CBC
HCT: 47.5 % — ABNORMAL HIGH (ref 36.0–46.0)
Hemoglobin: 16 g/dL — ABNORMAL HIGH (ref 12.0–15.0)
MCH: 31.6 pg (ref 26.0–34.0)
MCHC: 33.7 g/dL (ref 30.0–36.0)
MCV: 93.9 fL (ref 80.0–100.0)
Platelets: 167 10*3/uL (ref 150–400)
RBC: 5.06 MIL/uL (ref 3.87–5.11)
RDW: 12.4 % (ref 11.5–15.5)
WBC: 10.3 10*3/uL (ref 4.0–10.5)
nRBC: 0 % (ref 0.0–0.2)

## 2022-04-04 LAB — HIV ANTIBODY (ROUTINE TESTING W REFLEX): HIV Screen 4th Generation wRfx: NONREACTIVE

## 2022-04-04 LAB — VITAMIN D 25 HYDROXY (VIT D DEFICIENCY, FRACTURES): Vit D, 25-Hydroxy: 13.5 ng/mL — ABNORMAL LOW (ref 30–100)

## 2022-04-04 MED ORDER — CALCITONIN (SALMON) 200 UNIT/ACT NA SOLN
1.0000 | Freq: Every day | NASAL | Status: DC
  Administered 2022-04-04 – 2022-04-07 (×4): 1 via NASAL
  Filled 2022-04-04 (×2): qty 3.7

## 2022-04-04 NOTE — Assessment & Plan Note (Addendum)
Asymptomatic.  UC showed multiple species. Likely contaminated catch.  - Consider recollect - No treatment at this time.

## 2022-04-04 NOTE — Consult Note (Signed)
ORTHOPAEDIC CONSULTATION  REQUESTING PHYSICIAN: Westley Chandler, MD  Chief Complaint: Right back, hip pain  HPI: Rose Bridges is a 61 y.o. female who presents with with right back/buttock pain after a fall. She is status post left hip hemiarthroplasty with Dr. Eulah Pont, seen in 2021 with Dr. Roda Shutters where they discussed revision. She has had quite limited ability to ambulate since her fall several days prior. Currently in ED for further assessment.  Past Medical History:  Diagnosis Date   Hyperlipemia    Hypertension    Osteogenesis imperfecta    Osteoporosis    Past Surgical History:  Procedure Laterality Date   ABDOMINAL HYSTERECTOMY     HIP ARTHROPLASTY Left 09/26/2015   Procedure: ARTHROPLASTY BIPOLAR HIP (HEMIARTHROPLASTY);  Surgeon: Sheral Apley, MD;  Location: [facility];  Service: Orthopedics;  Laterality: Left;   ORIF HUMERUS FRACTURE Right 11/06/2016   Procedure: OPEN REDUCTION INTERNAL FIXATION (ORIF) PROXIMAL HUMERUS FRACTURE;  Surgeon: Sheral Apley, MD;  Location: [facility] OR;  Service: Orthopedics;  Laterality: Right;   Social History   Socioeconomic History   Marital status: Divorced    Spouse name: Not on file   Number of children: 1   Years of education: College   Highest education level: Not on file  Occupational History   Occupation: Film/video editor  Tobacco Use   Smoking status: Every Day    Packs/day: 0.25    Types: Cigarettes   Smokeless tobacco: Never  Vaping Use   Vaping Use: Never used  Substance and Sexual Activity   Alcohol use: No    Alcohol/week: 85.0 standard drinks of alcohol    Types: 85 Shots of liquor per week    Comment: No alcohol use since 11/04/16.   Drug use: No   Sexual activity: Not Currently  Other Topics Concern   Not on file  Social History Narrative   Pt lives with her mother.   Right-handed.   1-2 bottles (16Oz each) of [facility] or Dr. Reino Kent.   Occasionally drinks Monster drinks.   Social Determinants of Health    Financial Resource Strain: Not on file  Food Insecurity: Not on file  Transportation Needs: Not on file  Physical Activity: Not on file  Stress: Not on file  Social Connections: Not on file   Family History  Problem Relation Age of Onset   Stroke Mother    Heart attack Mother    Hypertension Mother    Stroke Sister    Stroke Brother    Lung cancer Father    - negative except otherwise stated in the family history section Allergies  Allergen Reactions   Codeine     "migraines"    Prior to Admission medications   Medication Sig Start Date End Date Taking? Authorizing Provider  folic acid (FOLVITE) 1 MG tablet Take 1 tablet (1 mg total) by mouth daily. 09/09/19   Storm Frisk, MD  levETIRAcetam (KEPPRA) 500 MG tablet Take 1 tablet (500 mg total) by mouth 2 (two) times daily. 09/09/19   Storm Frisk, MD  naproxen sodium (ALEVE) 220 MG tablet Take 440 mg by mouth 2 (two) times daily.    [provider]  oxyCODONE-acetaminophen (PERCOCET) 5-325 MG tablet Take 1 tablet by mouth every 8 (eight) hours as needed for severe pain. 11/15/19   Mesner, Barbara Cower, MD  RIVAROXABAN Carlena Hurl) VTE STARTER PACK (15 & 20 MG TABLETS) Follow package directions: Take one 15mg  tablet by mouth twice a day. On day 22,  switch to one 20mg  tablet once a day. Take with food. 11/15/19   Mesner, 11/17/19, MD  senna-docusate (SENOKOT-S) 8.6-50 MG tablet Take 1 tablet by mouth 2 (two) times daily. 04/21/18   Regalado, Belkys A, MD  thiamine 100 MG tablet Take 1 tablet (100 mg total) by mouth daily. 09/09/19   11/09/19, MD   CT PELVIS WO CONTRAST  Result Date: 04/03/2022 CLINICAL DATA:  Fall.  Possible hip fracture. EXAM: CT PELVIS WITHOUT CONTRAST TECHNIQUE: Multidetector CT imaging of the pelvis was performed following the standard protocol without intravenous contrast. RADIATION DOSE REDUCTION: This exam was performed according to the departmental dose-optimization program which includes  automated exposure control, adjustment of the mA and/or kV according to patient size and/or use of iterative reconstruction technique. COMPARISON:  CT 04/19/2018. FINDINGS: Urinary Tract: Bladder is unremarkable. Visualized ureters are within normal. Bowel: Appendix is normal. Visualized small bowel is normal. There is moderate fecal retention over the rectosigmoid colon. Vascular/Lymphatic: Minimal calcified plaque at the aortic bifurcation. Visualized aorta is normal in caliber. Mild symmetric calcified plaque over the femoral arteries bilaterally. Remaining vascular structures are unremarkable. Reproductive:  Previous hysterectomy. Other:  No free fluid or inflammatory change. Musculoskeletal: There is a subtle fracture involving the right sacral ala without significant displacement. Possible subtle fracture along the left side of the symphysis pubis joint. 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 Remaining bony structures unremarkable. IMPRESSION: 1. Subtle fracture involving the right sacral ala without significant displacement. Possible subtle fracture along the left side of the symphysis pubis joint. 2. 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 3. Aortic atherosclerosis. Aortic Atherosclerosis (ICD10-I70.0). Electronically Signed   By: 04/21/2018 M.D.   On: 04/03/2022 15:05   CT Lumbar Spine Wo Contrast  Result Date: 04/03/2022 CLINICAL DATA:  14/08/2021. Back pain. EXAM: CT LUMBAR SPINE WITHOUT CONTRAST TECHNIQUE: Multidetector CT imaging of the lumbar spine was performed without intravenous contrast administration. Multiplanar CT image reconstructions were also generated. RADIATION DOSE REDUCTION: This exam was performed according to the departmental dose-optimization program which includes automated exposure control, adjustment of the mA and/or kV according to patient size and/or use of iterative reconstruction technique. COMPARISON:  Lumbar radiographs, same date. FINDINGS:  Segmentation: There are five lumbar type vertebral bodies. The last full intervertebral disc space is labeled L5-S1. Alignment: Normal Vertebrae: Age advanced osteopenia. Compression deformities of L1 and L3 appear to be remote. No paraspinal hematoma to suggest an acute fracture. Paraspinal and other soft tissues: No significant paraspinal or retroperitoneal findings. Scattered vascular calcifications. Disc levels: T12-L1 no significant findings. L1-2: No significant findings. L2-3: Mild diffuse annular bulge with mild spinal and bilateral lateral recess stenosis. No foraminal stenosis. L3-4: Mild bulging annulus, facet disease and ligamentum flavum thickening contributing to mild spinal and bilateral lateral recess stenosis. No foraminal stenosis. L4-5: Mild facet disease but no disc protrusions, spinal or foraminal stenosis. L5-S1: Moderate facet disease but no disc protrusions, spinal or foraminal stenosis. IMPRESSION: 1. Compression deformities of L1 and L3 appear to be remote. No paraspinal hematoma to suggest an acute fracture. 2. Age advanced osteopenia. 3. Mild spinal and bilateral lateral recess stenosis at L2-3 and L3-4. Electronically Signed   By: Larey Seat M.D.   On: 04/03/2022 15:03   DG Lumbar Spine Complete  Result Date: 04/03/2022 CLINICAL DATA:  Lower back pain after fall. EXAM: LUMBAR SPINE - COMPLETE 4+ VIEW COMPARISON:  None Available. FINDINGS: Mild compression deformity of L1 vertebral body is noted  most consistent with old fracture. Moderate compression deformity of L3 vertebral body is noted most consistent with old fracture. No spondylolisthesis is noted. Disc spaces are well-maintained. IMPRESSION: L1 and L3 compression deformities are noted most consistent with old fractures. If patient is symptomatic over this area, MRI may be performed to evaluate for the possibility of acute fracture. Electronically Signed   By: Lupita Raider M.D.   On: 04/03/2022 12:49   DG Hip Unilat With  Pelvis 2-3 Views Right  Result Date: 04/03/2022 CLINICAL DATA:  Right hip pain after fall. EXAM: DG HIP (WITH OR WITHOUT PELVIS) 2-3V RIGHT COMPARISON:  May 07, 2018. FINDINGS: There is no evidence of hip fracture or dislocation. There is no evidence of arthropathy or other focal bone abnormality. IMPRESSION: Negative. Electronically Signed   By: Lupita Raider M.D.   On: 04/03/2022 12:46     Positive ROS: All other systems have been reviewed and were otherwise negative with the exception of those mentioned in the HPI and as above.  Physical Exam: General: No acute distress Cardiovascular: No pedal edema Respiratory: No cyanosis, no use of accessory musculature GI: No organomegaly, abdomen is soft and non-tender Skin: No lesions in the area of chief complaint Neurologic: Sensation intact distally Psychiatric: Patient is at baseline mood and affect Lymphatic: No axillary or cervical lymphadenopathy  MUSCULOSKELETAL:  Tenderness with compression about iliac crest. No tenderness with log roll of right hip. Remainder of distal neurosensory exam intact.  Independent Imaging Review: CT scan pelvis, ap xray pelvis: There is a LC 1 type compression fracture of right side/sacral ala  Assessment: 61 year old female with right sided sacral compression fracture. At this time, she may be weight bearing as tolerated. Would recommend PT to ambulate as tolerated and mobilize as pain allows. No surgical intervention recommend for this injury as this fracture is stable.  Plan: Will update [facility] for follow up with orthopedics. Likely will require SNF placement for mobility needs. Recommend PT consultation.  Thank you for the consult and the opportunity to see Ms. Rose Hammersmith, MD [facility] 7:40 AM

## 2022-04-04 NOTE — Progress Notes (Signed)
Patient is not eligible for the [facility] REACH waiver and will require a 3 day qualifying stay for SNF placement.  Edwin Dada, MSW, LCSW Transitions of Care  Clinical Social Worker II [phone]

## 2022-04-04 NOTE — ED Notes (Signed)
5N13 bed assigned at 8:13am, called RN Charge Heather at [facility] requesting initiation of purple man

## 2022-04-04 NOTE — Progress Notes (Addendum)
Daily Progress Note Intern Pager: [phone]  Patient name: Rose Bridges Medical record number: [id number] Date of birth: [birth year] Age: 61 y.o. Gender: female  Primary Care Provider: Cain Saupe, MD Consultants: Ortho (s/o 12/6) Code Status: FULL  Pt Overview and Major Events to Date:  12/5 - Admitted  Assessment and Plan: Rose Bridges is a 61 y.o. female with past medical history of osteogenesis imperfecta, osteoporosis, HTN, HLD, and seizure disorder presented to the ED after a mechanical fall on 12/2 resulting in imaging confirmed pelvic fracture.  * Pelvic fracture (HCC) Mechanical fall on 12/2 resulting in subtle, nondisplaced fx of R ala and L symphysis pubis. At baseline uses a Rolator walker and conducts ADLs. Currently unable to ambulate on her own 2/2 pain. - Orthopedics consulted; recommending non-operative management, PT/OT consult and possible SNF placement - Start Intranasal Calcitonin for compressive fx pain management  - Continue Pain management: Tylenol 650mg  q6h sch, Oxycodone 5mg  q4h prn breakthrough pain - Bowel regimen due to narcotic use: Miralax and home med Senna - PT/OT consulted - TOC consulted for likely SNF placement - Consider Zoledronic acid given the severity of her OI and osteoporosis - insurance will probably not cover inpatient per pharmacy  Urinary frequency Urinary frequency x 2 days without other symptoms. UA showed small leukocytes, ketonuria, bacteruria, no nitrites present. Currently not being treated. - Urine Culture pending, if positive will treat with first-generation cephalosporin  Tobacco use Current smoker with likely 30+ year pack history. Reports prior nicotine replacements had unfavorable side effects. - Declined nicotine patch - Advised smoking cessation   FEN/GI: Soft Diet due to missing teeth PPx: Lovenox Dispo: Med Surg, pending PT/OT eval for possible SNF placement  Subjective:  No major overnight events,  unable to ambulate on her own due to pain. Her pain seems to have improved, but she doesn't think the Tylenol that was given to her has helped relieved much of her pain this morning after sitting on the bed pan for some time too. She would like to eat as she has not eaten since Friday. She has pain with lifting her right leg from her hips. Shares that her urinary frequency and dribbling has improved since admission and she denies any pain with urination, but continues to have the urge to urinate.  Objective: Temp:  [97.4 F (36.3 C)-98.5 F (36.9 C)] 97.4 F (36.3 C) (12/06 0909) Pulse Rate:  [61-92] 67 (12/06 1042) Resp:  [16-18] 16 (12/06 0909) BP: (102-155)/(46-88) 102/57 (12/06 1042) SpO2:  [95 %-99 %] 95 % (12/06 1042) Physical Exam: General: Alert, pleasant woman in NAD. Cardiovascular: RRR. No M/R/G. Respiratory: CTAB. Normal WOB on RA. Abdomen: Soft, nontender, nondistended. Bowel sounds present. Extremities: Tenderness over R SIJ. Nontender over L SIJ. No pain upon ASIS compression bilaterally. Pain with R>L hip flexion. Full strength in dorsiflexion and plantarflexion bilaterally. 2+ dorsalis pedis pulses. Sensation intact. Neuro: Motor and sensation intact globally. Unable to ambulate Psych: Mood and affect stable.  Laboratory: Most recent CBC Lab Results  Component Value Date   WBC 10.3 04/04/2022   HGB 16.0 (H) 04/04/2022   HCT 47.5 (H) 04/04/2022   MCV 93.9 04/04/2022   PLT 167 04/04/2022   Most recent BMP    Latest Ref Rng & Units 04/04/2022    4:20 AM  BMP  Glucose 70 - 99 mg/dL 68   BUN 8 - 23 mg/dL 19   Creatinine 14/09/2021 - 1.00 mg/dL 14/09/2021   Sodium 5.28 - 4.13 mmol/L 142  Potassium 3.5 - 5.1 mmol/L 4.3   Chloride 98 - 111 mmol/L 102   CO2 22 - 32 mmol/L 23   Calcium 8.9 - 10.3 mg/dL 9.8     Other pertinent labs  UA - small leuks, no nitrites, ketonuria, and bacteruria present  Imaging/Diagnostic Tests: No new imaging.  Britt Bolognese, Medical  Student 04/04/2022, 11:43 AM  [facility] Intern pager: [phone], text pages welcome  I was personally present and re-performed the exam and medical decision making and verified the service and findings are accurately documented in the student's note. Alcus Dad, MD 04/04/2022 12:03 PM  Secure chat group [facility] Teaching Service

## 2022-04-04 NOTE — Evaluation (Signed)
Physical Therapy Evaluation Patient Details Name: Rose Bridges MRN: [id number] DOB: [birth year] Today's Date: 04/04/2022  History of Present Illness  Pt is 61 yo female admitted 04/03/22 after fall (on 03/31/22) with nondisplaced fx R sacral ala and L symphysis pubis.  Ortho consulted and recommended non-operative management with WBAT.  Pt with hx including osteogenesis imperfecta, osteoporosis, HTN, HLD, and seizure disorder  Clinical Impression  Pt admitted with above diagnosis. At baseline, pt is ambulatory without AD and completes ADLs.  She has some assist with ADLs.  Pt resides with her elderly mother with dementia who cannot assist. Pt's mother with caregiver but caregiver unable to assist pt without increasing cost per pt which she states isn't option.  Today, pt requiring mod A with transfers and only able to take a few small steps to/from [facility].  Pt limited by pain and with heavy reliance on RW.  Pt with good rehab potential.  Will benefit from acute PT .  Also, recommend SNF at d/c - did note potential barrier to SNF of Medicare and observation status.  If pt returns home will need near 24 hr care and up to mod A.  Did set POC to 5 x week to try to progress independence as there may be barriers to SNF. Pt currently with functional limitations due to the deficits listed below (see PT Problem List). Pt will benefit from skilled PT to increase their independence and safety with mobility to allow discharge to the venue listed below.          Recommendations for follow up therapy are one component of a multi-disciplinary discharge planning process, led by the attending physician.  Recommendations may be updated based on patient status, additional functional criteria and insurance authorization.  Follow Up Recommendations Skilled nursing-short term rehab (<3 hours/day) Can patient physically be transported by private vehicle: No    Assistance Recommended at Discharge Frequent or constant  Supervision/Assistance  Patient can return home with the following  A lot of help with walking and/or transfers;A lot of help with bathing/dressing/bathroom;Assistance with cooking/housework;Help with stairs or ramp for entrance    Equipment Recommendations Rolling walker (2 wheels);Wheelchair (measurements PT);Wheelchair cushion (measurements PT)  Recommendations for Other Services       Functional Status Assessment Patient has had a recent decline in their functional status and demonstrates the ability to make significant improvements in function in a reasonable and predictable amount of time.     Precautions / Restrictions Precautions Precautions: Fall Restrictions Weight Bearing Restrictions: Yes RLE Weight Bearing: Weight bearing as tolerated LLE Weight Bearing: Weight bearing as tolerated      Mobility  Bed Mobility Overal bed mobility: Needs Assistance Bed Mobility: Supine to Sit, Sit to Supine     Supine to sit: Min assist, HOB elevated Sit to supine: Mod assist, HOB elevated   General bed mobility comments: Use of rails, bed elevated, increased time.  Cues to keep legs close together for pain control.  Required mod A for legs back to bed for pain control    Transfers Overall transfer level: Needs assistance Equipment used: Rolling walker (2 wheels) Transfers: Sit to/from Stand, Bed to chair/wheelchair/BSC Sit to Stand: Mod assist   Step pivot transfers: Mod assist       General transfer comment: Sit to stand from bed and BSC - pt initiates well but slow to rise and has point ~1/2 way to standing very painful requiring mod A to complete (similar for controlling descent).  Took small steps  to and from [facility] with mod A , increased time, and RW.    Ambulation/Gait               General Gait Details: Not able to ambulate further than to/from [facility]  Stairs            Wheelchair Mobility    Modified Rankin (Stroke Patients Only)       Balance  Overall balance assessment: Needs assistance Sitting-balance support: No upper extremity supported Sitting balance-Leahy Scale: Good     Standing balance support: Bilateral upper extremity supported Standing balance-Leahy Scale: Poor Standing balance comment: Steady with RW; requiring bil UE support for pain control                             Pertinent Vitals/Pain Pain Assessment Pain Assessment: 0-10 Pain Score: 7  Pain Location: R pelvis; increased with transfers Pain Descriptors / Indicators: Discomfort, Sharp, Moaning Pain Intervention(s): Limited activity within patient's tolerance, Monitored during session, RN gave pain meds during session    Home Living Family/patient expects to be discharged to:: Private residence Living Arrangements: Parent Available Help at Discharge: Other (Comment) (Limited assist.  Pt lives with her mother who has dementia and has a caregiver herself. But caregiver cannot assist pt (other than household IADLs)) Type of Home: House Home Access: Stairs to enter Entrance Stairs-Rails: None Entrance Stairs-Number of Steps: 3-4 Alternate Level Stairs-Number of Steps: Reports she stays on couch downstairs to sleep but tends to use restroom upstairs, there is a bathroom available on main level if needed Home Layout: Two level;Able to live on main level with bedroom/bathroom Home Equipment: Rollator (4 wheels);BSC/3in1 Additional Comments: Pt tangential at times -needs redirecting for history    Prior Function Prior Level of Function : Independent/Modified Independent             Mobility Comments: Reports normally ambulatory short community distances without AD ADLs Comments: Does not drive; has groceries delivered; independent with ADLs but very limited IADLs     Hand Dominance        Extremity/Trunk Assessment   Upper Extremity Assessment Upper Extremity Assessment: Overall WFL for tasks assessed    Lower Extremity  Assessment Lower Extremity Assessment: LLE deficits/detail;RLE deficits/detail RLE Deficits / Details: ROM WFL (limited hip abd for pain control); MMT: 3/5 but not further tested for pain control LLE Deficits / Details: ROM WFL (limited hip abd for pain control); MMT: 3/5 but not further tested for pain control    Cervical / Trunk Assessment Cervical / Trunk Assessment: Normal  Communication   Communication: No difficulties  Cognition Arousal/Alertness: Awake/alert Behavior During Therapy: WFL for tasks assessed/performed Overall Cognitive Status: Within Functional Limits for tasks assessed                                 General Comments: Tangential at times but able to be redirected        General Comments      Exercises     Assessment/Plan    PT Assessment Patient needs continued PT services  PT Problem List Decreased strength;Pain;Decreased range of motion;Decreased activity tolerance;Decreased knowledge of use of DME;Decreased balance;Decreased safety awareness;Decreased mobility;Decreased knowledge of precautions       PT Treatment Interventions DME instruction;Therapeutic exercise;Gait training;Wheelchair mobility training;Balance training;Stair training;Modalities;Functional mobility training;Therapeutic activities;Patient/family education    PT Goals (Current goals can be found  in the Care Plan section)  Acute Rehab PT Goals Patient Stated Goal: go to rehab PT Goal Formulation: With patient Time For Goal Achievement: 04/18/22 Potential to Achieve Goals: Good    Frequency Min 5X/week     Co-evaluation               AM-PAC PT "6 Clicks" Mobility  Outcome Measure Help needed turning from your back to your side while in a flat bed without using bedrails?: A Lot Help needed moving from lying on your back to sitting on the side of a flat bed without using bedrails?: A Lot Help needed moving to and from a bed to a chair (including a  wheelchair)?: A Lot Help needed standing up from a chair using your arms (e.g., wheelchair or bedside chair)?: A Lot Help needed to walk in hospital room?: Total Help needed climbing 3-5 steps with a railing? : Total 6 Click Score: 10    End of Session Equipment Utilized During Treatment: Gait belt Activity Tolerance: Patient limited by pain Patient left: in bed;with call bell/phone within reach;with bed alarm set;with nursing/sitter in room Nurse Communication: Mobility status (pt had BM) PT Visit Diagnosis: Other abnormalities of gait and mobility (R26.89);Pain Pain - Right/Left: Right Pain - part of body: Hip    Time: 8341-9622 PT Time Calculation (min) (ACUTE ONLY): 37 min   Charges:   PT Evaluation $PT Eval Moderate Complexity: 1 Mod PT Treatments $Therapeutic Activity: 8-22 mins        Anise Salvo, PT Acute Rehab [facility] [phone]   Rayetta Humphrey 04/04/2022, 1:20 PM

## 2022-04-04 NOTE — Evaluation (Signed)
Occupational Therapy Evaluation Patient Details Name: Rose Bridges MRN: [id number] DOB: 10-26-1960 Today's Date: 04/04/2022   History of Present Illness Pt is 61 yo female admitted 04/03/22 after fall (on 03/31/22) with nondisplaced fx R sacral ala and L symphysis pubis.  Ortho consulted and recommended non-operative management with WBAT.  Pt with hx including osteogenesis imperfecta, osteoporosis, L THA; humerus ORIF,HTN, HLD, and seizure disorder   Clinical Impression   PTA pt lives with her Mom @ a modified independent level using a rollator. Currently requires Mod A  with limited mobility to a BSC and Max to total A with LB ADL tasks. Educated pt on technique to use BUE to offload legs during transfers to decrease pain. Pt will benefit from rehab at SNF to maximize functional level of independence to facilitate safe DC home. Acute OT to follow.      Recommendations for follow up therapy are one component of a multi-disciplinary discharge planning process, led by the attending physician.  Recommendations may be updated based on patient status, additional functional criteria and insurance authorization.   Follow Up Recommendations  Skilled nursing-short term rehab (<3 hours/day)     Assistance Recommended at Discharge Frequent or constant Supervision/Assistance  Patient can return home with the following A little help with walking and/or transfers;A lot of help with bathing/dressing/bathroom;Assistance with cooking/housework;Direct supervision/assist for medications management;Direct supervision/assist for financial management;Assist for transportation;Help with stairs or ramp for entrance    Functional Status Assessment  Patient has had a recent decline in their functional status and demonstrates the ability to make significant improvements in function in a reasonable and predictable amount of time.  Equipment Recommendations  None recommended by OT    Recommendations for Other  Services       Precautions / Restrictions Precautions Precautions: Fall Restrictions Weight Bearing Restrictions: Yes RLE Weight Bearing: Weight bearing as tolerated LLE Weight Bearing: Weight bearing as tolerated      Mobility Bed Mobility Overal bed mobility: Needs Assistance Bed Mobility: Supine to Sit, Sit to Supine     Supine to sit: Min assist, HOB elevated Sit to supine: Mod assist, HOB elevated   General bed mobility comments: Use of rails, bed elevated, increased time.  Cues to keep legs close together for pain control.  Required mod A for legs back to bed for pain control    Transfers Overall transfer level: Needs assistance Equipment used: Rolling walker (2 wheels) Transfers: Sit to/from Stand, Bed to chair/wheelchair/BSC Sit to Stand: Mod assist     Step pivot transfers: Mod assist     General transfer comment: Sit to stand from bed and BSC - pt initiates well but slow to rise and has point ~1/2 way to standing very painful requiring mod A to complete (similar for controlling descent).  Took small steps to and from [facility] with mod A , increased time, and RW.      Balance Overall balance assessment: Needs assistance Sitting-balance support: No upper extremity supported Sitting balance-Leahy Scale: Good     Standing balance support: Bilateral upper extremity supported Standing balance-Leahy Scale: Poor Standing balance comment: Steady with RW; requiring bil UE support for pain control                           ADL either performed or assessed with clinical judgement   ADL Overall ADL's : Needs assistance/impaired     Grooming: Set up   Upper Body Bathing: Set up  Lower Body Bathing: Total assistance;Sit to/from stand   Upper Body Dressing : Set up   Lower Body Dressing: Total assistance   Toilet Transfer: Stand-pivot;BSC/3in1;Rolling walker (2 wheels);Moderate assistance   Toileting- Clothing Manipulation and Hygiene: Total  assistance       Functional mobility during ADLs: Moderate assistance       Vision Baseline Vision/History: 1 Wears glasses       Perception     Praxis      Pertinent Vitals/Pain Pain Assessment Pain Assessment: Faces Faces Pain Scale: Hurts little more Pain Location: R pelvis; increased with transfers Pain Descriptors / Indicators: Discomfort, Sharp, Moaning Pain Intervention(s): Limited activity within patient's tolerance     Hand Dominance Right   Extremity/Trunk Assessment Upper Extremity Assessment Upper Extremity Assessment: Overall WFL for tasks assessed   Lower Extremity Assessment Lower Extremity Assessment: Defer to PT evaluation   Cervical / Trunk Assessment Cervical / Trunk Assessment: Normal   Communication Communication Communication: No difficulties   Cognition Arousal/Alertness: Awake/alert Behavior During Therapy: WFL for tasks assessed/performed Overall Cognitive Status: No family/caregiver present to determine baseline cognitive functioning                                 General Comments: Tangential at times but able to be redirected     General Comments  darkened area L buttocks - nsg made aware    Exercises     Shoulder Instructions      Home Living Family/patient expects to be discharged to:: Private residence Living Arrangements: Parent Available Help at Discharge: Other (Comment) (Limited assist.  Pt lives with her mother who has dementia and has a caregiver herself. But caregiver cannot assist pt (other than household IADLs)) Type of Home: House Home Access: Stairs to enter [employer] of Steps: 3-4 Entrance Stairs-Rails: None Home Layout: Two level;Able to live on main level with bedroom/bathroom Alternate Level Stairs-Number of Steps: Reports she stays on couch downstairs to sleep but tends to use restroom upstairs, there is a bathroom available on main level if needed   Bathroom Shower/Tub: Retail banker: Handicapped height Bathroom Accessibility: Yes   Home Equipment: Rollator (4 wheels);BSC/3in1          Prior Functioning/Environment Prior Level of Function : Independent/Modified Independent             Mobility Comments: Reports normally ambulatory short community distances without AD ADLs Comments: Does not drive; has groceries delivered; independent with ADLs but very limited IADLs        OT Problem List: Decreased strength;Decreased range of motion;Decreased activity tolerance;Impaired balance (sitting and/or standing);Decreased safety awareness;Decreased knowledge of use of DME or AE;Decreased knowledge of precautions;Pain      OT Treatment/Interventions: Self-care/ADL training;Therapeutic exercise;DME and/or AE instruction;Therapeutic activities;Patient/family education;Balance training    OT Goals(Current goals can be found in the care plan section) Acute Rehab OT Goals Patient Stated Goal: to go to rehab then home OT Goal Formulation: With patient Time For Goal Achievement: 04/18/22 Potential to Achieve Goals: Good  OT Frequency: Min 2X/week    Co-evaluation              AM-PAC OT "6 Clicks" Daily Activity     Outcome Measure Help from another person eating meals?: None Help from another person taking care of personal grooming?: A Little Help from another person toileting, which includes using toliet, bedpan, or urinal?: Total Help from  another person bathing (including washing, rinsing, drying)?: A Lot Help from another person to put on and taking off regular upper body clothing?: A Little Help from another person to put on and taking off regular lower body clothing?: Total 6 Click Score: 14   End of Session Equipment Utilized During Treatment: Gait belt;Rolling walker (2 wheels) Nurse Communication: Mobility status;Precautions;Weight bearing status  Activity Tolerance: Patient tolerated treatment well Patient left: in  bed;with call bell/phone within reach;with bed alarm set (requesitng all rails left up)  OT Visit Diagnosis: Unsteadiness on feet (R26.81);Other abnormalities of gait and mobility (R26.89);Muscle weakness (generalized) (M62.81);History of falling (Z91.81);Pain Pain - Right/Left:  (B) Pain - part of body: Leg;Hip (sacrum)                Time: 2297-9892 OT Time Calculation (min): 38 min Charges:  OT General Charges $OT Visit: 1 Visit OT Evaluation $OT Eval Moderate Complexity: 1 Mod OT Treatments $Self Care/Home Management : 8-22 mins  Luisa Dago, OT/L   Acute OT Clinical Specialist Acute Rehabilitation Services Pager [phone] Office [phone]   [facility] 04/04/2022, 5:43 PM

## 2022-04-04 NOTE — Assessment & Plan Note (Deleted)
Current smoker with likely 30+ year pack history. Reports prior nicotine replacements had unfavorable side effects. - Declined nicotine patch - Advised smoking cessation

## 2022-04-04 NOTE — ED Notes (Signed)
ED TO INPATIENT HANDOFF REPORT  ED Nurse Name and Phone #:Tamieka Rancourt [phone]  S Name/Age/Gender Rose Bridges 61 y.o. female Room/Bed: H015C/H015C  Code Status   Code Status: Full Code  Home/SNF/Other Home Patient oriented to: self, place, time, and situation Is this baseline? Yes   Triage Complete: Triage complete  Chief Complaint Pelvic fracture (HCC) [S32.9XXA]  Triage Note Pt arrives via EMS from home with reports of a fall on Friday. Pt has been laying on couch since injury. Endorses frequent urination with foul odor. Still having pain to right hip.     Allergies Allergies  Allergen Reactions   Codeine     "migraines"     Level of Care/Admitting Diagnosis ED Disposition     ED Disposition  Admit   Condition  --   Comment  Hospital Area: [facility] [[id number]]  Level of Care: Med-Surg [16]  May place patient in observation at [facility] or [facility] if equivalent level of care is available:: Yes  Covid Evaluation: Asymptomatic - no recent exposure (last 10 days) testing not required  Diagnosis: Pelvic fracture [facility]) [[id number]]  Admitting Physician: Westley Chandler [[id number]]  Attending Physician: Westley Chandler [[id number]]          B Medical/Surgery History Past Medical History:  Diagnosis Date   Hyperlipemia    Hypertension    Osteogenesis imperfecta    Osteoporosis    Past Surgical History:  Procedure Laterality Date   ABDOMINAL HYSTERECTOMY     HIP ARTHROPLASTY Left 09/26/2015   Procedure: ARTHROPLASTY BIPOLAR HIP (HEMIARTHROPLASTY);  Surgeon: Sheral Apley, MD;  Location: [facility] OR;  Service: Orthopedics;  Laterality: Left;   ORIF HUMERUS FRACTURE Right 11/06/2016   Procedure: OPEN REDUCTION INTERNAL FIXATION (ORIF) PROXIMAL HUMERUS FRACTURE;  Surgeon: Sheral Apley, MD;  Location: [facility] OR;  Service: Orthopedics;  Laterality: Right;     A IV Location/Drains/Wounds Patient Lines/Drains/Airways Status     Active  Line/Drains/Airways     Name Placement date Placement time Site Days   Peripheral IV 04/04/22 20 G Left Antecubital 04/04/22  0257  Antecubital  less than 1            Intake/Output Last 24 hours No intake or output data in the 24 hours ending 04/04/22 0910  Labs/Imaging Results for orders placed or performed during the hospital encounter of 04/03/22 (from the past 48 hour(s))  Urinalysis, Routine w reflex microscopic Urine, Clean Catch     Status: Abnormal   Collection Time: 04/04/22  4:00 AM  Result Value Ref Range   Color, Urine Kate Sweetman (A) YELLOW    Comment: BIOCHEMICALS MAY BE AFFECTED BY COLOR   APPearance HAZY (A) CLEAR   Specific Gravity, Urine 1.024 1.005 - 1.030   pH 5.0 5.0 - 8.0   Glucose, UA NEGATIVE NEGATIVE mg/dL   Hgb urine dipstick NEGATIVE NEGATIVE   Bilirubin Urine NEGATIVE NEGATIVE   Ketones, ur 80 (A) NEGATIVE mg/dL   Protein, ur NEGATIVE NEGATIVE mg/dL   Nitrite NEGATIVE NEGATIVE   Leukocytes,Ua SMALL (A) NEGATIVE   RBC / HPF 0-5 0 - 5 RBC/hpf   WBC, UA 21-50 0 - 5 WBC/hpf   Bacteria, UA RARE (A) NONE SEEN   Squamous Epithelial / LPF 0-5 0 - 5   Mucus PRESENT    Hyaline Casts, UA PRESENT     Comment: Performed at [facility], [address], [place], [place] [address]  CBC     Status: Abnormal  Collection Time: 04/04/22  4:20 AM  Result Value Ref Range   WBC 10.3 4.0 - 10.5 K/uL   RBC 5.06 3.87 - 5.11 MIL/uL   Hemoglobin 16.0 (H) 12.0 - 15.0 g/dL   HCT 96.047.5 (H) 45.436.0 - 09.846.0 %   MCV 93.9 80.0 - 100.0 fL   MCH 31.6 26.0 - 34.0 pg   MCHC 33.7 30.0 - 36.0 g/dL   RDW 11.912.4 14.711.5 - 82.915.5 %   Platelets 167 150 - 400 K/uL   nRBC 0.0 0.0 - 0.2 %    Comment: Performed at [facility], [address] [place], [place] [address]  Comprehensive metabolic panel     Status: Abnormal   Collection Time: 04/04/22  4:20 AM  Result Value Ref Range   Sodium 142 135 - 145 mmol/L   Potassium 4.3 3.5 - 5.1 mmol/L    Comment: HEMOLYSIS AT THIS LEVEL MAY  AFFECT RESULT   Chloride 102 98 - 111 mmol/L   CO2 23 22 - 32 mmol/L   Glucose, Bld 68 (L) 70 - 99 mg/dL    Comment: Glucose reference range applies only to samples taken after fasting for at least 8 hours.   BUN 19 8 - 23 mg/dL   Creatinine, Ser 0.860.96 0.44 - 1.00 mg/dL   Calcium 9.8 8.9 - 57.810.3 mg/dL   Total Protein 6.1 (L) 6.5 - 8.1 g/dL   Albumin 3.0 (L) 3.5 - 5.0 g/dL   AST 28 15 - 41 U/L    Comment: HEMOLYSIS AT THIS LEVEL MAY AFFECT RESULT   ALT 15 0 - 44 U/L    Comment: HEMOLYSIS AT THIS LEVEL MAY AFFECT RESULT   Alkaline Phosphatase 96 38 - 126 U/L   Total Bilirubin 1.7 (H) 0.3 - 1.2 mg/dL    Comment: HEMOLYSIS AT THIS LEVEL MAY AFFECT RESULT   GFR, Estimated >60 >60 mL/min    Comment: (NOTE) Calculated using the CKD-EPI Creatinine Equation (2021)    Anion gap 17 (H) 5 - 15    Comment: Performed at [facility], [address] [place], [place] [address]   CT PELVIS WO CONTRAST  Result Date: 04/03/2022 CLINICAL DATA:  Fall.  Possible hip fracture. EXAM: CT PELVIS WITHOUT CONTRAST TECHNIQUE: Multidetector CT imaging of the pelvis was performed following the standard protocol without intravenous contrast. RADIATION DOSE REDUCTION: This exam was performed according to the departmental dose-optimization program which includes automated exposure control, adjustment of the mA and/or kV according to patient size and/or use of iterative reconstruction technique. COMPARISON:  CT 04/19/2018. FINDINGS: Urinary Tract: Bladder is unremarkable. Visualized ureters are within normal. Bowel: Appendix is normal. Visualized small bowel is normal. There is moderate fecal retention over the rectosigmoid colon. Vascular/Lymphatic: Minimal calcified plaque at the aortic bifurcation. Visualized aorta is normal in caliber. Mild symmetric calcified plaque over the femoral arteries bilaterally. Remaining vascular structures are unremarkable. Reproductive:  Previous hysterectomy. Other:  No free fluid or  inflammatory change. Musculoskeletal: There is a subtle fracture involving the right sacral ala without significant displacement. Possible subtle fracture along the left side of the symphysis pubis joint. 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 Remaining bony structures unremarkable. IMPRESSION: 1. Subtle fracture involving the right sacral ala without significant displacement. Possible subtle fracture along the left side of the symphysis pubis joint. 2. 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 3. Aortic atherosclerosis. Aortic Atherosclerosis (ICD10-I70.0). Electronically Signed   By: Elberta Fortisaniel  Boyle M.D.   On:  04/03/2022 15:05   CT Lumbar Spine Wo Contrast  Result Date: 04/03/2022 CLINICAL DATA:  Larey Seat. Back pain. EXAM: CT LUMBAR SPINE WITHOUT CONTRAST TECHNIQUE: Multidetector CT imaging of the lumbar spine was performed without intravenous contrast administration. Multiplanar CT image reconstructions were also generated. RADIATION DOSE REDUCTION: This exam was performed according to the departmental dose-optimization program which includes automated exposure control, adjustment of the mA and/or kV according to patient size and/or use of iterative reconstruction technique. COMPARISON:  Lumbar radiographs, same date. FINDINGS: Segmentation: There are five lumbar type vertebral bodies. The last full intervertebral disc space is labeled L5-S1. Alignment: Normal Vertebrae: Age advanced osteopenia. Compression deformities of L1 and L3 appear to be remote. No paraspinal hematoma to suggest an acute fracture. Paraspinal and other soft tissues: No significant paraspinal or retroperitoneal findings. Scattered vascular calcifications. Disc levels: T12-L1 no significant findings. L1-2: No significant findings. L2-3: Mild diffuse annular bulge with mild spinal and bilateral lateral recess stenosis. No foraminal stenosis. L3-4: Mild bulging annulus, facet disease and ligamentum flavum  thickening contributing to mild spinal and bilateral lateral recess stenosis. No foraminal stenosis. L4-5: Mild facet disease but no disc protrusions, spinal or foraminal stenosis. L5-S1: Moderate facet disease but no disc protrusions, spinal or foraminal stenosis. IMPRESSION: 1. Compression deformities of L1 and L3 appear to be remote. No paraspinal hematoma to suggest an acute fracture. 2. Age advanced osteopenia. 3. Mild spinal and bilateral lateral recess stenosis at L2-3 and L3-4. Electronically Signed   By: Rudie Meyer M.D.   On: 04/03/2022 15:03   DG Lumbar Spine Complete  Result Date: 04/03/2022 CLINICAL DATA:  Lower back pain after fall. EXAM: LUMBAR SPINE - COMPLETE 4+ VIEW COMPARISON:  None Available. FINDINGS: Mild compression deformity of L1 vertebral body is noted most consistent with old fracture. Moderate compression deformity of L3 vertebral body is noted most consistent with old fracture. No spondylolisthesis is noted. Disc spaces are well-maintained. IMPRESSION: L1 and L3 compression deformities are noted most consistent with old fractures. If patient is symptomatic over this area, MRI may be performed to evaluate for the possibility of acute fracture. Electronically Signed   By: Lupita Raider M.D.   On: 04/03/2022 12:49   DG Hip Unilat With Pelvis 2-3 Views Right  Result Date: 04/03/2022 CLINICAL DATA:  Right hip pain after fall. EXAM: DG HIP (WITH OR WITHOUT PELVIS) 2-3V RIGHT COMPARISON:  May 07, 2018. FINDINGS: There is no evidence of hip fracture or dislocation. There is no evidence of arthropathy or other focal bone abnormality. IMPRESSION: Negative. Electronically Signed   By: Lupita Raider M.D.   On: 04/03/2022 12:46    Pending Labs Unresulted Labs (From admission, onward)     Start     Ordered   04/04/22 7616  VITAMIN D 25 Hydroxy (Vit-D Deficiency, Fractures)  Once,   R        04/04/22 0637   04/04/22 0614  Urine Culture  (Urine Culture)  Add-on,   AD        Question:  Indication  Answer:  Urgency/frequency   04/04/22 0613   04/03/22 2349  HIV Antibody (routine testing w rflx)  (HIV Antibody (Routine testing w reflex) panel)  Once,   R        04/03/22 2349            Vitals/Pain Today's Vitals   04/03/22 2232 04/04/22 0514 04/04/22 0515 04/04/22 0909  BP: (!) 155/88 105/68  (!) 106/46  Pulse: 76 78  61  Resp: 18 18  16   Temp: 98 F (36.7 C) 97.6 F (36.4 C)  (!) 97.4 F (36.3 C)  TempSrc: Oral Oral  Oral  SpO2: 95% 95%  96%  Weight:      Height:      PainSc:   6      Isolation Precautions No active isolations  Medications Medications  lidocaine (LIDODERM) 5 % 1 patch (1 patch Transdermal Patch Applied 04/03/22 2129)  folic acid (FOLVITE) tablet 1 mg (has no administration in time range)  thiamine (VITAMIN B1) tablet 100 mg (has no administration in time range)  senna-docusate (Senokot-S) tablet 1 tablet (has no administration in time range)  enoxaparin (LOVENOX) injection 40 mg (40 mg Subcutaneous Given 04/04/22 0651)  0.9 %  sodium chloride infusion ( Intravenous New Bag/Given 04/04/22 0257)  acetaminophen (TYLENOL) tablet 650 mg (650 mg Oral Given 04/04/22 14/6/23)  oxyCODONE (Oxy IR/ROXICODONE) immediate release tablet 5 mg (5 mg Oral Given 04/04/22 0258)  polyethylene glycol (MIRALAX / GLYCOLAX) packet 17 g (has no administration in time range)  oxyCODONE (Oxy IR/ROXICODONE) immediate release tablet 5 mg (5 mg Oral Given 04/03/22 2130)  acetaminophen (TYLENOL) tablet 1,000 mg (1,000 mg Oral Given 04/03/22 2130)    Mobility non-ambulatory Moderate fall risk   Focused Assessments    R Recommendations: See Admitting Provider Note  Report given to:   Additional Notes:

## 2022-04-04 NOTE — Assessment & Plan Note (Deleted)
-  Restart home meds: thiamine and folate

## 2022-04-05 LAB — COMPREHENSIVE METABOLIC PANEL
ALT: 18 U/L (ref 0–44)
AST: 29 U/L (ref 15–41)
Albumin: 2.6 g/dL — ABNORMAL LOW (ref 3.5–5.0)
Alkaline Phosphatase: 89 U/L (ref 38–126)
Anion gap: 7 (ref 5–15)
BUN: 24 mg/dL — ABNORMAL HIGH (ref 8–23)
CO2: 26 mmol/L (ref 22–32)
Calcium: 8.1 mg/dL — ABNORMAL LOW (ref 8.9–10.3)
Chloride: 102 mmol/L (ref 98–111)
Creatinine, Ser: 0.91 mg/dL (ref 0.44–1.00)
GFR, Estimated: >60 mL/min (ref >60)
Glucose, Bld: 115 mg/dL — ABNORMAL HIGH (ref 70–99)
Potassium: 3.5 mmol/L (ref 3.5–5.1)
Sodium: 135 mmol/L (ref 135–145)
Total Bilirubin: 0.8 mg/dL (ref 0.3–1.2)
Total Protein: 5.3 g/dL — ABNORMAL LOW (ref 6.5–8.1)

## 2022-04-05 LAB — URINE CULTURE

## 2022-04-05 MED ORDER — VITAMIN D (ERGOCALCIFEROL) 1.25 MG (50000 UNIT) PO CAPS
50000.0000 [IU] | ORAL_CAPSULE | ORAL | Status: DC
  Administered 2022-04-05: 50000 [IU] via ORAL
  Filled 2022-04-05: qty 1

## 2022-04-05 NOTE — Progress Notes (Signed)
Physical Therapy Treatment Patient Details Name: Rose Bridges MRN: [id number] DOB: 1960/05/19 Today's Date: 04/05/2022   History of Present Illness Pt is 61 yo female admitted 04/03/22 after fall (on 03/31/22) with nondisplaced fx R sacral ala and L symphysis pubis.  Ortho consulted and recommended non-operative management with WBAT.  Pt with hx including osteogenesis imperfecta, osteoporosis, L THA; humerus ORIF,HTN, HLD, and seizure disorder.    PT Comments    Pt received in supine, c/o pain after recent return to bed from using [facility] with nursing assist, pt premedicated prior to session. Pt defers EOB/OOB mobility despite encouragement but was agreeable to instruction on supine UE/LE HEP and given IS, pt able to demo back for handout to keep track of usage. Frequency updated per dept recommendation as pt now agreeable to SNF and has inpatient status currently, discussed with supervising PT x2. Pt continues to benefit from PT services to progress toward functional mobility goals.    Recommendations for follow up therapy are one component of a multi-disciplinary discharge planning process, led by the attending physician.  Recommendations may be updated based on patient status, additional functional criteria and insurance authorization.  Follow Up Recommendations  Skilled nursing-short term rehab (<3 hours/day) Can patient physically be transported by private vehicle: No   Assistance Recommended at Discharge Frequent or constant Supervision/Assistance  Patient can return home with the following A lot of help with walking and/or transfers;A lot of help with bathing/dressing/bathroom;Assistance with cooking/housework;Help with stairs or ramp for entrance   Equipment Recommendations  Rolling walker (2 wheels);Wheelchair (measurements PT);Wheelchair cushion (measurements PT)    Recommendations for Other Services       Precautions / Restrictions Precautions Precautions:  Fall Restrictions Weight Bearing Restrictions: Yes RLE Weight Bearing: Weight bearing as tolerated LLE Weight Bearing: Weight bearing as tolerated     Mobility  Bed Mobility Overal bed mobility: Needs Assistance Bed Mobility: Rolling Rolling: Min assist         General bed mobility comments: Use of rails, increased time to roll toward her R side for placement of pillow behind R lower back for pressure relief. Encouraged her to attempt EOB but with bed in partial chair posture, pt screaming/calling out in pain despite premedication. Pt defers EOB/OOB due to pain after recently using [facility] with staff assist.    Transfers                   General transfer comment: pt defers due to pain/fatigue    Ambulation/Gait                   Stairs             Wheelchair Mobility    Modified Rankin (Stroke Patients Only)       Balance Overall balance assessment: Needs assistance     Sitting balance - Comments: pt defers today                                    Cognition Arousal/Alertness: Awake/alert Behavior During Therapy: WFL for tasks assessed/performed, Anxious Overall Cognitive Status: No family/caregiver present to determine baseline cognitive functioning                                 General Comments: Perseverating on pain and recently returned to bed from transfer to toilet with nursing staff. C/o  staff not helping her enough but agrees she needs to do what she can on her own when discussed.        Exercises Other Exercises Other Exercises: supine BLE AROM: ankle pumps, QS, GS, heel slides ~5 reps ea with teachback, pt at times defers due to pain, pt unable to tolerate hip abd or SAQ today. Handout given to reinforce. Other Exercises: IS x 10 reps pt achieves ~1500-1600 encouraged her to perform hourly given she has been mostly in bed    General Comments General comments (skin integrity, edema, etc.): Discussion  on pressure relief strategies/frequency, pt reports she has not been up in chair but has been getting up to [facility] 4 or 5 times/day, pt encouraged to roll to L/R sides and placement of pillow for pressure relief every 1-2 hours given increased time in bed. Pt somewhat receptive.      Pertinent Vitals/Pain Pain Assessment Pain Assessment: Faces Faces Pain Scale: Hurts even more Pain Location: R pelvis; recently got back to bed and moaning/hyperventilating with limited bed mobility Pain Descriptors / Indicators: Discomfort, Sharp, Moaning, Grimacing, Guarding Pain Intervention(s): Limited activity within patient's tolerance, Monitored during session, Premedicated before session, Repositioned     PT Goals (current goals can now be found in the care plan section) Acute Rehab PT Goals Patient Stated Goal: go to rehab PT Goal Formulation: With patient Time For Goal Achievement: 04/18/22 Progress towards PT goals: Progressing toward goals    Frequency    Min 3X/week (d/w supv PT)      PT Plan Current plan remains appropriate       AM-PAC PT "6 Clicks" Mobility   Outcome Measure  Help needed turning from your back to your side while in a flat bed without using bedrails?: A Little Help needed moving from lying on your back to sitting on the side of a flat bed without using bedrails?: A Lot Help needed moving to and from a bed to a chair (including a wheelchair)?: A Lot Help needed standing up from a chair using your arms (e.g., wheelchair or bedside chair)?: A Lot Help needed to walk in hospital room?: Total Help needed climbing 3-5 steps with a railing? : Total 6 Click Score: 11    End of Session   Activity Tolerance: Patient limited by pain Patient left: in bed;with call bell/phone within reach;with bed alarm set;Other (comment) (semi-sidelying toward her L side) Nurse Communication: Mobility status;Other (comment) (pt refusing EOB/OOB) PT Visit Diagnosis: Other abnormalities of  gait and mobility (R26.89);Pain Pain - Right/Left: Right Pain - part of body: Hip     Time: 1245-8099 PT Time Calculation (min) (ACUTE ONLY): 13 min  Charges:  $Therapeutic Exercise: 8-22 mins                     Rose Bridges P., PTA Acute Rehabilitation Services Secure Chat Preferred 9a-5:30pm Office: [phone]    Dorathy Kinsman [facility] 04/05/2022, 7:02 PM

## 2022-04-05 NOTE — Progress Notes (Addendum)
Daily Progress Note Intern Pager: [phone]  Patient name: Rose Bridges Medical record number: [id number] Date of birth: 1960-05-13 Age: 61 y.o. Gender: female  Primary Care Provider: Cain Saupe, MD Consultants: Ortho (s/o 12/6) Code Status: FULL  Pt Overview and Major Events to Date:  12/5 - Admitted  Assessment and Plan: Braeden Dolinski is a 61 y.o. female with past medical history of osteogenesis imperfecta, osteoporosis, HTN, HLD, and seizure disorder presented to the ED after a mechanical fall on 12/2 resulting in imaging confirmed pelvic fracture.   * Pelvic fracture (HCC) Mechanical fall on 12/2 resulting in subtle, nondisplaced fx of R ala and L symphysis pubis. At baseline uses a Rolator walker and conducts ADLs. Currently unable to ambulate on her own 2/2 pain. - Orthopedics consulted; recommending non-operative management - PT/OT consulted, which recommended SNF; would like to have PT work to progress independence - TOC - working on SNF, but needs to be admitted for 3 days d/t insurance - Start Vitamin D supplementation - Continue Intranasal Calcitonin for compressive fx pain management  - Continue Pain management: Tylenol 650mg  q6h sch, Lidocaine patch BID, Oxycodone 5mg  q4h prn breakthrough pain - Bowel regimen due to narcotic use: Miralax and home med Senna - Considered Zoledronic acid given the severity of her OI and osteoporosis - insurance will probably not cover inpatient per pharmacy  Urinary frequency Urinary frequency x 2 days without other symptoms. UA showed small leukocytes, ketonuria, bacteruria, no nitrites present. Currently not being treated. - Urine Culture pending, if positive will treat with first-generation cephalosporin  Tobacco use Current smoker with likely 30+ year pack history. Reports prior nicotine replacements had unfavorable side effects. - Declined nicotine patch - Advised smoking cessation   Chronic, stable  conditions: Hypertension: stable, no longer on meds d/t changing her diet and having well controlled BP Seizure disorder: stable, used to be on Keppra 500 BID, but last seizure was 2 years ago and she does not recall the last time she took Keppra but thinks it was ~3 months ago.  FEN/GI: Soft Diet due to missing teeth PPx: Lovenox Dispo: Med Surg, pending SNF auth  Subjective:  No overnight events. Patient's pain has improved slightly since yesterday. She feels as if when its just Tylenol her pain isn't as relieved. Her urinary frequency is still present but has improved as well. No pain with urination or sense of urgency. She has been able to get up from the bed with assistance to bedside commode to urinate. Has had 2 BM movements and been able to eat small amounts of food.  Objective: Temp:  [97.8 F (36.6 C)-98.3 F (36.8 C)] 98.3 F (36.8 C) (12/07 1340) Pulse Rate:  [72-100] 77 (12/07 1340) Resp:  [16-20] 16 (12/07 1340) BP: (101-128)/(57-78) 118/67 (12/07 1340) SpO2:  [92 %-95 %] 94 % (12/07 1340) Physical Exam: General: Alert, pleasant woman in NAD. Cardiovascular: RRR. No M/R/G. Respiratory: CTAB. Normal WOB on RA. Abdomen: Soft, nontender, nondistended. Bowel sounds present. Extremities: Tenderness over R SIJ. Nontender over L SIJ. Pain with R>L hip flexion. Full strength in dorsiflexion and plantarflexion bilaterally. 2+ dorsalis pedis pulses. Sensation intact.  Neuro: Motor and sensation intact globally. Unable to ambulate Psych: Mood and affect stable.   Laboratory: Most recent CBC Lab Results  Component Value Date   WBC 10.3 04/04/2022   HGB 16.0 (H) 04/04/2022   HCT 47.5 (H) 04/04/2022   MCV 93.9 04/04/2022   PLT 167 04/04/2022   Most recent BMP  Latest Ref Rng & Units 04/05/2022    3:54 AM  BMP  Glucose 70 - 99 mg/dL 081   BUN 8 - 23 mg/dL 24   Creatinine 3.88 - 1.00 mg/dL 7.19   Sodium 597 - 471 mmol/L 135   Potassium 3.5 - 5.1 mmol/L 3.5   Chloride  98 - 111 mmol/L 102   CO2 22 - 32 mmol/L 26   Calcium 8.9 - 10.3 mg/dL 8.1     Other pertinent labs - none  Imaging/Diagnostic Tests: No new imaging.   Velora Heckler, Medical Student 04/05/2022, 9:45 AM  [facility] FPTS Intern pager: [phone], text pages welcome Secure chat group [facility] Teaching Service    I was personally present and re-performed the exam and medical decision making and verified the service and findings are accurately documented in the student's note. Maury Dus, MD 04/05/2022 2:29 PM

## 2022-04-05 NOTE — Plan of Care (Signed)

## 2022-04-05 NOTE — TOC Initial Note (Signed)
Transition of Care [facility]) - Initial/Assessment Note    Patient Details  Name: Rose Bridges MRN: [id number] Date of Birth: 1960/07/24  Transition of Care [facility]) CM/SW Contact:    Joanne Chars, LCSW Phone Number: 04/05/2022, 1:28 PM  Clinical Narrative:     CSW met with pt regarding DC recommendations for SNF.  Pt agreeable, choice document given, has been to [facility] in the past and would like to return if possible.  Pt lives at home with her elderly mother who has dementia.  There is HH aide in the home to assist mother, but not to assist pt--would cost more if the aide provides any help to pt.  Permission given to speak with daughter in law Nira Conn but not son Roderic Palau.  Referral sent out in hub for SNF.              Expected Discharge Plan: Skilled Nursing Facility Barriers to Discharge: Continued Medical Work up, SNF Pending bed offer   Patient Goals and CMS Choice Patient states their goals for this hospitalization and ongoing recovery are:: be able to get up to use the bathroom CMS Medicare.gov Compare Post Acute Care list provided to:: Patient Choice offered to / list presented to : Patient  Expected Discharge Plan and Services Expected Discharge Plan: [place] In-house Referral: Clinical Social Work   Post Acute Care Choice: [place] Living arrangements for the past 2 months: [place]                                      Prior Living Arrangements/Services Living arrangements for the past 2 months: Single Family Home Lives with:: Parents (lives with mother) Patient language and need for interpreter reviewed:: Yes Do you feel safe going back to the place where you live?: Yes      Need for Family Participation in Patient Care: Yes (Comment) Care giver support system in place?: Yes (comment) Current home services: Other (comment) (none) Criminal Activity/Legal Involvement Pertinent to Current  Situation/Hospitalization: No - Comment as needed  Activities of Daily Living Home Assistive Devices/Equipment: Dentures (specify type) ADL Screening (condition at time of admission) Patient's cognitive ability adequate to safely complete daily activities?: Yes Is the patient deaf or have difficulty hearing?: No Does the patient have difficulty seeing, even when wearing glasses/contacts?: No Does the patient have difficulty concentrating, remembering, or making decisions?: No Patient able to express need for assistance with ADLs?: No Does the patient have difficulty dressing or bathing?: Yes Independently performs ADLs?: No Communication: Independent Grooming: Independent Feeding: Independent Bathing: Needs assistance Is this a change from baseline?: Pre-admission baseline Toileting: Dependent Is this a change from baseline?: Pre-admission baseline In/Out Bed: Needs assistance Is this a change from baseline?: Pre-admission baseline Walks in Home: Needs assistance Does the patient have difficulty walking or climbing stairs?: Yes Weakness of Legs: Both Weakness of Arms/Hands: Right  Permission Sought/Granted Permission sought to share information with : Family Supports Permission granted to share information with : Yes, Verbal Permission Granted  Share Information with NAME: daughter in Engineer, petroleum  Permission granted to share info w AGENCY: SNF        Emotional Assessment Appearance:: Appears stated age Attitude/Demeanor/Rapport: Engaged Affect (typically observed): Appropriate, Pleasant Orientation: :  (not recorded, appears oriented)      Admission diagnosis:  Urinary frequency [R35.0] Pelvic fracture ([place]) [S32.9XXA] Multiple closed fractures of pelvis with  stable disruption of pelvic ring, initial encounter [facility]) [S32.810A] Patient Active Problem List   Diagnosis Date Noted   Urinary frequency 04/04/2022   Pelvic fracture ([place]) 04/03/2022   Tobacco use 04/03/2022    Osteogenesis imperfecta 04/28/2018   Seizure ([place]) 03/26/2017   PCP:  Antony Blackbird, MD Pharmacy:   [facility] [address], [place] [address] Phone: [phone] Fax: [phone]     Social Determinants of Health (SDOH) Interventions    Readmission Risk Interventions     No data to display

## 2022-04-05 NOTE — NC FL2 (Signed)
[name] MEDICAID FL2 LEVEL OF CARE FORM     IDENTIFICATION  Patient Name: Rose Bridges Birthdate: Sep 10, 1960 Sex: female Admission Date (Current Location): 04/03/2022  [place] and [place] Number:  Rose Bridges [id number] P Facility and Address:  [facility], [address] [place], [place] [address]      Provider Number: [id number]  Attending Physician Name and Address:  Rose Dodrill, MD  Relative Name and Phone Number:  Rose Bridges Relative [phone]    Current Level of Care: Hospital Recommended Level of Care: Skilled Nursing Facility Prior Approval Number:    Date Approved/Denied:   PASRR Number: [id number]  Discharge Plan: SNF    Current Diagnoses: Patient Active Problem List   Diagnosis Date Noted   Urinary frequency 04/04/2022   Pelvic fracture (HCC) 04/03/2022   Tobacco use 04/03/2022   Osteogenesis imperfecta 04/28/2018   Seizure (HCC) 03/26/2017    Orientation RESPIRATION BLADDER Height & Weight     Self, Time, Situation, Place  Normal Incontinent Weight: 127 lb 13.9 oz (58 kg) Height:  5\' 4"  (162.6 cm)  BEHAVIORAL SYMPTOMS/MOOD NEUROLOGICAL BOWEL NUTRITION STATUS    Convulsions/Seizures Continent Diet (see discharge summary)  AMBULATORY STATUS COMMUNICATION OF NEEDS Skin   Limited Assist Verbally Other (Comment) (redness)                       Personal Care Assistance Level of Assistance  Bathing, Feeding, Dressing, Total care Bathing Assistance: Maximum assistance Feeding assistance: Limited assistance Dressing Assistance: Maximum assistance Total Care Assistance: Maximum assistance   Functional Limitations Info  Sight, Hearing, Speech Sight Info: Adequate Hearing Info: Adequate Speech Info: Adequate    SPECIAL CARE FACTORS FREQUENCY  PT (By licensed PT), OT (By licensed OT)     PT Frequency: 5x week OT Frequency: 5x week            Contractures Contractures Info: Not present     Additional Factors Info  Code Status, Allergies Code Status Info: full Allergies Info: codeine           Current Medications (04/05/2022):  This is the current hospital active medication list Current Facility-Administered Medications  Medication Dose Route Frequency Provider Last Rate Last Admin   acetaminophen (TYLENOL) tablet 650 mg  650 mg Oral Q6H 14/10/2021, MD   650 mg at 04/05/22 1221   calcitonin (salmon) (MIACALCIN/FORTICAL) nasal spray 1 spray  1 spray Alternating Nares Daily 14/07/23, MD   1 spray at 04/04/22 1445   enoxaparin (LOVENOX) injection 40 mg  40 mg Subcutaneous Q24H 14/06/23, MD   40 mg at 04/05/22 0545   folic acid (FOLVITE) tablet 1 mg  1 mg Oral Daily 14/07/23, MD   1 mg at 04/05/22 0832   lidocaine (LIDODERM) 5 % 1 patch  1 patch Transdermal Q24H 14/07/23, MD   1 patch at 04/04/22 2020   oxyCODONE (Oxy IR/ROXICODONE) immediate release tablet 5 mg  5 mg Oral Q4H PRN 14/06/23, MD   5 mg at 04/05/22 1221   polyethylene glycol (MIRALAX / GLYCOLAX) packet 17 g  17 g Oral Daily 14/07/23, MD   17 g at 04/05/22 14/07/23   senna-docusate (Senokot-S) tablet 1 tablet  1 tablet Oral BID 0254, MD   1 tablet at 04/05/22 14/07/23   thiamine (VITAMIN B1) tablet 100 mg  100 mg Oral Daily 2706, MD   100 mg at 04/05/22 14/07/23   Vitamin D (Ergocalciferol) (DRISDOL)  1.25 MG (50000 UNIT) capsule 50,000 Units  50,000 Units Oral Q7 days Rose Mans, MD         Discharge Medications: Please see discharge summary for a list of discharge medications.  Relevant Imaging Results:  Relevant Lab Results:   Additional Information SSN: [id number]. pt has been vaccinated for covid with on booster  Rose Bridges Jon, LCSW

## 2022-04-06 NOTE — TOC Progression Note (Addendum)
Transition of Care [facility]) - Progression Note    Patient Details  Name: Rose Bridges MRN: [id number] Date of Birth: [birth year]  Transition of Care [facility]) CM/SW Contact  Lorri Frederick, LCSW Phone Number: 04/06/2022, 9:47 AM  Clinical Narrative:   Per pt request, CSW attempted to call [facility] to discuss bed offers, no answer, LM  CSW provided bed offers to pt.  1120: TC [facility], presented bed offers.  Herbert Seta spoke with pt and they have decided to accept offer at [facility].    CSW confirmed with [name].  They can accept pt for Saturday admission.  [facility] will be contact tomorrow as well.    Expected Discharge Plan: Skilled Nursing Facility Barriers to Discharge: Continued Medical Work up, SNF Pending bed offer  Expected Discharge Plan and Services Expected Discharge Plan: Skilled Nursing Facility In-house Referral: Clinical Social Work   Post Acute Care Choice: Skilled Nursing Facility Living arrangements for the past 2 months: Single Family Home                                       Social Determinants of Health (SDOH) Interventions    Readmission Risk Interventions     No data to display

## 2022-04-06 NOTE — Progress Notes (Signed)
     Daily Progress Note Intern Pager: [phone]  Patient name: Rose Bridges Medical record number: [id number] Date of birth: June 19, 1960 Age: 61 y.o. Gender: female  Primary Care Provider: Cain Saupe, MD Consultants: Ortho Code Status: FULL  Pt Overview and Major Events to Date:  12/5 - Admitted, Ortho Consulted  Assessment and Plan: Rose Bridges is a 61 y.o. female with past medical history of osteogenesis imperfecta, osteoporosis, HTN, HLD, and seizure disorder presented to the ED after a mechanical fall on 12/2 resulting in imaging confirmed pelvic fracture.   * Pelvic fracture (HCC) Pain is improved.  Still unable to ambulate on her own. - Orthopedics consulted; recommending non-operative management - PT progress independence until SNF placement - Vitamin D supplementation - Continue Pain management:  Tylenol 650mg  q6h sch Lidocaine patch BID Oxycodone 5mg  q4h prn breakthrough pain Intranasal Calcitonin - Bowel regimen due to narcotic use: Miralax and home med Senna  Urinary frequency-resolved as of 04/06/2022 Asymptomatic.  UC showed multiple species. Likely contaminated catch.  - Consider recollect - No treatment at this time.   Chronic Stable Conditions  Tobacco Use Current smoker with likely 30+ year pack history. Reports prior nicotine replacements had unfavorable side effects. - Declined nicotine patch - Advised smoking cessation   FEN/GI: Soft diet PPx: Lovenox Dispo: Pending SNF auth, medically stable  Subjective:  Did better last night than the previous night she says sleep wise.  Does have pain with unintentional movement of her hips.  Is doing the best she can with physical therapy.  Is in significant pain with movement.  Request that social worker talk with her emergency contact about SNF placements.  Bowel movement this morning.  Distress continue to work with physical therapy to be independent.  Objective: Temp:  [98 F (36.7 C)-98.3 F  (36.8 C)] 98 F (36.7 C) (12/08 1033) Pulse Rate:  [67-84] 67 (12/08 1033) Resp:  [16-19] 18 (12/08 1033) BP: (112-148)/(58-69) 148/58 (12/08 1033) SpO2:  [94 %-96 %] 96 % (12/08 1033) Physical Exam: General: NAD, sitting comfortably in hospital bed Cardiovascular: RRR, no murmurs, no peripheral edema Respiratory: normal WOB on RA, CTAB, no wheezes, ronchi or rales Extremities: Moving all 4 extremities equally   Laboratory: Most recent CBC Lab Results  Component Value Date   WBC 10.3 04/04/2022   HGB 16.0 (H) 04/04/2022   HCT 47.5 (H) 04/04/2022   MCV 93.9 04/04/2022   PLT 167 04/04/2022   Most recent BMP    Latest Ref Rng & Units 04/05/2022    3:54 AM  BMP  Glucose 70 - 99 mg/dL 14/09/2021   BUN 8 - 23 mg/dL 24   Creatinine 14/10/2021 - 1.00 mg/dL 703   Sodium 5.00 - 9.38 mmol/L 135   Potassium 3.5 - 5.1 mmol/L 3.5   Chloride 98 - 111 mmol/L 102   CO2 22 - 32 mmol/L 26   Calcium 8.9 - 10.3 mg/dL 8.1     Other pertinent labs: none   Imaging/Diagnostic Tests: No new imaging. 182, MD 04/06/2022, 11:37 AM  PGY-1, [facility] Family Medicine FPTS Intern pager: [phone], text pages welcome Secure chat group [facility] Teaching Service

## 2022-04-06 NOTE — Progress Notes (Incomplete)
     Daily Progress Note Intern Pager: [phone]  Patient name: Rose Bridges Medical record number: [id number] Date of birth: March 29, 1961 Age: 61 y.o. Gender: female  Primary Care Provider: Cain Saupe, MD Consultants: Ortho Code Status: Full  Pt Overview and Major Events to Date:  12/5 - Admitted, Ortho Consulted   Assessment and Plan:  Rose Bridges is a 61 y.o. female with past medical history of osteogenesis imperfecta, osteoporosis, HTN, HLD, and seizure disorder presented to the ED after a mechanical fall on 12/2 resulting in imaging confirmed pelvic fracture.  Patient is medically stable and pending SNF  * Pelvic fracture (HCC) Pain is improved. Unable to ambulate independently.  - Orthopedics consulted; recommending non-operative management - Continued PT until SNF placement - Vitamin D supplementation - Continue Pain management:  Tylenol 650mg  q6h sch Lidocaine patch BID Oxycodone 5mg  q4h prn breakthrough pain Intranasal Calcitonin - Bowel regimen due to narcotic use: Miralax and home med Senna  Urinary frequency-resolved as of 04/06/2022 Asymptomatic.  UC showed multiple species. Likely contaminated catch.  - Consider recollect - No treatment at this time.    FEN/GI: soft diet PPx: Lovenox  Dispo:Pending SNF auth . Medially stable  Subjective:  ***  Objective: Temp:  [97.7 F (36.5 C)-98.4 F (36.9 C)] 98.4 F (36.9 C) (12/08 2134) Pulse Rate:  [67-84] 84 (12/08 2134) Resp:  [16-19] 16 (12/08 2134) BP: (117-172)/(58-86) 172/68 (12/08 2134) SpO2:  [95 %-98 %] 98 % (12/08 2134) Physical Exam: General: *** Cardiovascular: *** Respiratory: *** Abdomen: *** Extremities: ***  Laboratory: Most recent CBC Lab Results  Component Value Date   WBC 10.3 04/04/2022   HGB 16.0 (H) 04/04/2022   HCT 47.5 (H) 04/04/2022   MCV 93.9 04/04/2022   PLT 167 04/04/2022   Most recent BMP    Latest Ref Rng & Units 04/05/2022    3:54 AM  BMP  Glucose 70  - 99 mg/dL 14/09/2021   BUN 8 - 23 mg/dL 24   Creatinine 14/10/2021 - 1.00 mg/dL 184   Sodium 0.37 - 5.43 mmol/L 135   Potassium 3.5 - 5.1 mmol/L 3.5   Chloride 98 - 111 mmol/L 102   CO2 22 - 32 mmol/L 26   Calcium 8.9 - 10.3 mg/dL 8.1     Other pertinent labs ***   Imaging/Diagnostic Tests: Radiologist Impression: *** My interpretation: 606, DO 04/06/2022, 11:59 PM  PGY-***, [facility] Family Medicine FPTS Intern pager: [phone], text pages welcome Secure chat group [facility] Teaching Service

## 2022-04-06 NOTE — Care Management Important Message (Signed)
Important Message  Patient Details  Name: Rose Bridges MRN: [id number] Date of Birth: 10/12/1960   Medicare Important Message Given:  Yes     Sherilyn Banker 04/06/2022, 2:48 PM

## 2022-04-07 MED ORDER — ACETAMINOPHEN 325 MG PO TABS: 650.0000 mg | ORAL_TABLET | Freq: Four times a day (QID) | ORAL | Status: DC

## 2022-04-07 MED ORDER — BACLOFEN 5 MG PO TABS
5.0000 mg | ORAL_TABLET | Freq: Three times a day (TID) | ORAL | 0 refills | Status: DC | PRN
  Filled 2022-04-07: qty 30, 10d supply, fill #0

## 2022-04-07 MED ORDER — CALCITONIN (SALMON) 200 UNIT/ACT NA SOLN
1.0000 | Freq: Every day | NASAL | 12 refills | Status: AC
  Filled 2022-04-07: qty 3.7, 30d supply, fill #0

## 2022-04-07 MED ORDER — OXYCODONE HCL 5 MG PO TABS: 5.0000 mg | ORAL_TABLET | ORAL | 0 refills | Status: DC | PRN

## 2022-04-07 MED ORDER — VITAMIN D (ERGOCALCIFEROL) 1.25 MG (50000 UNIT) PO CAPS: 50000.0000 [IU] | ORAL_CAPSULE | ORAL | Status: DC

## 2022-04-07 MED ORDER — BACLOFEN 10 MG PO TABS
5.0000 mg | ORAL_TABLET | Freq: Three times a day (TID) | ORAL | Status: DC | PRN
  Administered 2022-04-07: 5 mg via ORAL
  Filled 2022-04-07: qty 1

## 2022-04-07 NOTE — Plan of Care (Signed)

## 2022-04-07 NOTE — Progress Notes (Signed)
CSW spoke with admissions at [facility] who states the patient can be accepted into the facility today.  Patient will go to [facility] via [place]. Patient will go to room # 1204P. RN to call report to [phone] and [name] when ready.  Edwin Dada, MSW, LCSW Transitions of Care  Clinical Social Worker II [phone]

## 2022-04-07 NOTE — Discharge Instructions (Signed)
Dear Rose Bridges,  Thank you for letting us participate in your care. You were hospitalized for a hip fracture and diagnosed with Pelvic fracture (HCC). You were treated with nonoperative measures including pain control and physical therapy.   POST-HOSPITAL & CARE INSTRUCTIONS Please follow-up with your orthopedic provider see follow-up appointment indicated below. Please continue to take medications as directed in the med list below. Go to your follow up appointments (listed below)   DOCTOR'S APPOINTMENT   No future appointments.  Contact information for follow-up providers     Huel Cote, MD Follow up.   Specialty: Orthopedic Surgery Contact information: [address] [place] [address] [phone]              Contact information for after-discharge care     Destination     [name] Preferred SNF .   Service: Skilled Nursing Contact information: [address] [place] [address] [phone]                     Take care and be well!  Family Medicine Teaching Service Inpatient Team [place]  [facility]  [address] [place], [place] [address] [phone]

## 2022-04-07 NOTE — Progress Notes (Signed)
Patient left the floor with [name]. Family is aware of the transfer.

## 2022-04-07 NOTE — Progress Notes (Signed)
Report called to Ashlasha, the nurse at [facility].

## 2022-04-07 NOTE — Discharge Summary (Addendum)
Family Medicine Teaching [facility] Discharge Summary  Patient name: Rose Bridges Medical record number: [id number] Date of birth: 07/16/1960 Age: 61 y.o. Gender: female Date of Admission: 04/03/2022  Date of Discharge: 04/07/22 Admitting Physician: Westley Chandler, MD  Primary Care Provider: Cain Saupe, MD Consultants: Orthopedic surgery  Indication for Hospitalization: Pelvic fracture  Discharge Diagnoses/Problem List:  Principal Problem for Admission: pelvic fracture Other Problems addressed during stay:  Principal Problem:   Pelvic fracture [facility])  Brief Hospital Course:  Rose Bridges is a 61 y.o. female with past medical history of osteogenesis imperfecta, osteoporosis, HTN, HLD, and seizure disorder presented to the ED after a mechanical fall on 12/2 resulting in imaging confirmed pelvic fracture. Her hospital course is outlined below:  Pelvis Fracture Patient sustained a mechanical fall on 12/2 in her home, resulting in a subtle, nondisplaced fx of R ala and L symphysis pubis. Was unable to ambulate with her Rolator walker at home due to pain and continues to have difficulty doing so. Ortho recommended non-operative management and advised PT/OT which recommended SNF placement. Pain controlled throughout hospital course with tylenol, oxycodone, and intranasal calcitonin and discharged to SNF for rehab.  Intranasal calcitonin was continued for total treatment of 4 weeks  Urinary Frequency 2 days of increased urinary frequency, increased urge, and no dysuria upon admission. UA showed possible UTI.  Culture result multiple species likely contaminated catch.  Patient asymptomatic no further treatment was pursued.  PCP Follow-Up: Consider bisphosphonate therapy outpatient. Discontinue intranasal calcitonin after total 4 weeks of treatment (05/02/2022)  Disposition: SNF  Discharge Condition: medically stable   Discharge Exam:  Vitals:   04/06/22 2134 04/07/22 1300   BP: (!) 172/68 135/80  Pulse: 84 78  Resp: 16 18  Temp: 98.4 F (36.9 C) 98.6 F (37 C)  SpO2: 98% 98%   General: NAD Neuro: A&O Cardiovascular: RRR, no murmurs, no peripheral edema Respiratory: normal WOB on RA, CTAB, no wheezes, ronchi or rales Abdomen: soft, NTTP, no rebound or guarding Extremities: Moving all 4 extremities equally  Significant Procedures: none  Significant Labs and Imaging:     Latest Ref Rng & Units 04/05/2022    3:54 AM 04/04/2022    4:20 AM 11/15/2019    8:49 PM  BMP  Glucose 70 - 99 mg/dL 409  68  91   BUN 8 - 23 mg/dL 24  19  14    Creatinine 0.44 - 1.00 mg/dL  8.11  9.14   Sodium 135 - 145 mmol/L 135  142  140   Potassium 3.5 - 5.1 mmol/L 3.5  4.3  3.5   Chloride 98 - 111 mmol/L 102  102  104   CO2 22 - 32 mmol/L 26  23  25    Calcium 8.9 - 10.3 mg/dL 8.1  9.8  9.4       Latest Ref Rng & Units 04/04/2022    4:20 AM 11/15/2019    8:49 PM 07/02/2019    2:07 PM  CBC  WBC 4.0 - 10.5 K/uL 10.3  8.1  9.9   Hemoglobin 12.0 - 15.0 g/dL 11/17/2019  09/01/2019  95.6   Hematocrit 36.0 - 46.0 % 47.5  51.1  50.5   Platelets 150 - 400 K/uL 167  213  212     Pertinent Imaging   CT Pelvis wo Contrast 04/03/22 IMPRESSION: 1. Subtle fracture involving the right sacral ala without significant displacement. Possible subtle fracture along the left side of the symphysis pubis joint. 2.  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 3. Aortic atherosclerosis.  Results/Tests Pending at Time of Discharge: none  Discharge Medications:  Allergies as of 04/07/2022       Reactions   Codeine Other (See Comments)   Migraine        Medication List     STOP taking these medications    Aleve 220 MG tablet Generic drug: naproxen sodium   levETIRAcetam 500 MG tablet Commonly known as: Keppra       TAKE these medications    acetaminophen 325 MG tablet Commonly known as: TYLENOL Take 2 tablets (650 mg total) by mouth every 6 (six) hours.    Baclofen 5 MG Tabs Take 5 mg by mouth every 8 (eight) hours as needed for muscle spasms.   calcitonin (salmon) 200 UNIT/ACT nasal spray Commonly known as: MIACALCIN/FORTICAL Place 1 spray into alternate nostrils daily. Start taking on: April 08, 2022   folic acid 1 MG tablet Commonly known as: FOLVITE Take 1 tablet (1 mg total) by mouth daily.   oxyCODONE 5 MG immediate release tablet Commonly known as: Oxy IR/ROXICODONE Take 1 tablet (5 mg total) by mouth every 4 (four) hours as needed for severe pain or breakthrough pain.   thiamine 100 MG tablet Commonly known as: VITAMIN B1 Take 1 tablet (100 mg total) by mouth daily.   Vitamin D (Ergocalciferol) 1.25 MG (50000 UNIT) Caps capsule Commonly known as: DRISDOL Take 1 capsule (50,000 Units total) by mouth every 7 (seven) days. Start taking on: April 12, 2022        Discharge Instructions: Please refer to Patient Instructions section of EMR for full details.  Patient was counseled important signs and symptoms that should prompt return to medical care, changes in medications, dietary instructions, activity restrictions, and follow up appointments.   Follow-Up Appointments:  Contact information for follow-up providers     Huel Cote, MD Follow up.   Specialty: Orthopedic Surgery Contact information: [address] [place] [address] [phone]              Contact information for after-discharge care     Destination     [name] Preferred SNF .   Service: Skilled Nursing Contact information: [address] [place] [address] [phone]                    Celine Mans, MD 04/07/2022, 2:24 PM PGY-1, [facility] Family Medicine  I was personally present and performed or re-performed the history, physical exam and medical decision making activities of this service and have verified that the service and findings are accurately documented in the  intern's note.  Shelby Mattocks, DO                  04/07/2022, 5:00 PM

## 2022-04-09 ENCOUNTER — Other Ambulatory Visit: Payer: Self-pay

## 2022-04-10 ENCOUNTER — Other Ambulatory Visit: Payer: Self-pay

## 2022-04-11 ENCOUNTER — Other Ambulatory Visit: Payer: Self-pay

## 2022-04-16 ENCOUNTER — Other Ambulatory Visit: Payer: Self-pay

## 2022-04-18 ENCOUNTER — Ambulatory Visit (INDEPENDENT_AMBULATORY_CARE_PROVIDER_SITE_OTHER): Payer: Medicare Other | Admitting: Orthopaedic Surgery

## 2022-04-18 DIAGNOSIS — Q780 Osteogenesis imperfecta: Secondary | ICD-10-CM | POA: Diagnosis not present

## 2022-04-18 DIAGNOSIS — S32810A Multiple fractures of pelvis with stable disruption of pelvic ring, initial encounter for closed fracture: Secondary | ICD-10-CM

## 2022-04-18 NOTE — Progress Notes (Signed)
Chief Complaint: Right hip and back pain     History of Present Illness:    Monya Kozakiewicz is a 61 y.o. female presents today for follow-up after hospitalization for lateral compression type pelvic injury.  She is status post left partial replacement with Dr. Renaye Rakers and subsequently seen by my partner Dr. Glee Arvin for discussion of conversion to total hip arthroplasty.  Since she has been in rehab she has been working on increasing and progressing her range of motion.  She is not able to walk with a walker and is improving her distances weekly basis.    Surgical History:   none  PMH/PSH/Family History/Social History/Meds/Allergies:    Past Medical History:  Diagnosis Date   Hyperlipemia    Hypertension    Osteogenesis imperfecta    Osteoporosis    Past Surgical History:  Procedure Laterality Date   ABDOMINAL HYSTERECTOMY     HIP ARTHROPLASTY Left 09/26/2015   Procedure: ARTHROPLASTY BIPOLAR HIP (HEMIARTHROPLASTY);  Surgeon: Sheral Apley, MD;  Location: [facility] OR;  Service: Orthopedics;  Laterality: Left;   ORIF HUMERUS FRACTURE Right 11/06/2016   Procedure: OPEN REDUCTION INTERNAL FIXATION (ORIF) PROXIMAL HUMERUS FRACTURE;  Surgeon: Sheral Apley, MD;  Location: [facility] OR;  Service: Orthopedics;  Laterality: Right;   Social History   Socioeconomic History   Marital status: Divorced    Spouse name: Not on file   Number of children: 1   Years of education: College   Highest education level: Not on file  Occupational History   Occupation: Film/video editor  Tobacco Use   Smoking status: Every Day    Packs/day: 0.25    Types: Cigarettes   Smokeless tobacco: Never  Vaping Use   Vaping Use: Never used  Substance and Sexual Activity   Alcohol use: No    Alcohol/week: 85.0 standard drinks of alcohol    Types: 85 Shots of liquor per week    Comment: No alcohol use since 11/04/16.   Drug use: No   Sexual activity: Not Currently  Other  Topics Concern   Not on file  Social History Narrative   Pt lives with her mother.   Right-handed.   1-2 bottles (16Oz each) of [facility] or Dr. Reino Kent.   Occasionally drinks Monster drinks.   Social Determinants of Health   Financial Resource Strain: Not on file  Food Insecurity: No Food Insecurity (04/04/2022)   Hunger Vital Sign    Worried About Running Out of Food in the Last Year: Never true    Ran Out of Food in the Last Year: Never true  Transportation Needs: Unmet Transportation Needs (04/04/2022)   PRAPARE - Administrator, Civil Service (Medical): Yes    Lack of Transportation (Non-Medical): Yes  Physical Activity: Not on file  Stress: Not on file  Social Connections: Not on file   Family History  Problem Relation Age of Onset   Stroke Mother    Heart attack Mother    Hypertension Mother    Stroke Sister    Stroke Brother    Lung cancer Father    Allergies  Allergen Reactions   Codeine Other (See Comments)    Migraine    Current Outpatient Medications  Medication Sig Dispense Refill   acetaminophen (TYLENOL) 325 MG tablet Take 2 tablets (  650 mg total) by mouth every 6 (six) hours.     Baclofen 5 MG TABS Take 1 tablet by mouth every 8 (eight) hours as needed for muscle spasms. 30 each 0   calcitonin, salmon, (MIACALCIN/FORTICAL) 200 UNIT/ACT nasal spray Place 1 spray into alternate nostrils daily. 3.7 mL 12   folic acid (FOLVITE) 1 MG tablet Take 1 tablet (1 mg total) by mouth daily. (Patient not taking: Reported on 04/04/2022) 90 tablet 3   oxyCODONE (OXY IR/ROXICODONE) 5 MG immediate release tablet Take 1 tablet (5 mg total) by mouth every 4 (four) hours as needed for severe pain or breakthrough pain. 20 tablet 0   thiamine 100 MG tablet Take 1 tablet (100 mg total) by mouth daily. (Patient not taking: Reported on 04/04/2022) 90 tablet 2   Vitamin D, Ergocalciferol, (DRISDOL) 1.25 MG (50000 UNIT) CAPS capsule Take 1 capsule (50,000 Units total) by  mouth every 7 (seven) days. 5 capsule    No current facility-administered medications for this visit.   No results found.  Review of Systems:   A ROS was performed including pertinent positives and negatives as documented in the HPI.  Physical Exam :   Constitutional: NAD and appears stated age Neurological: Alert and oriented Psych: Appropriate affect and cooperative There were no vitals taken for this visit.   Comprehensive Musculoskeletal Exam:    Sits in a wheelchair.  She is able to forward flex at the hips to approximately 90 degrees.  This does cause pain about the right hip back area  Imaging:     I personally reviewed and interpreted the radiographs.   Assessment:   61 y.o. female with lateral compression type injury of the pelvis.  Overall I do believe that this will continue to be stable and her walking distances should improve as her pain improves.  I will plan to see her back in 1 month for final check.  At this time she may follow-up with Dr. Roda Shutters as needed for further discussion of conversion to total hip arthroplasty return to clinic 1 month for reassessment  Plan :    -Return to clinic in 1 month for reassessment     I personally saw and evaluated the patient, and participated in the management and treatment plan.  Huel Cote, MD Attending Physician, Orthopedic Surgery  This document was dictated using Dragon voice recognition software. A reasonable attempt at proof reading has been made to minimize errors.

## 2022-05-10 ENCOUNTER — Emergency Department (HOSPITAL_COMMUNITY): Payer: Medicare Other

## 2022-05-10 ENCOUNTER — Other Ambulatory Visit: Payer: Self-pay

## 2022-05-10 ENCOUNTER — Encounter (HOSPITAL_COMMUNITY): Payer: Self-pay

## 2022-05-10 ENCOUNTER — Inpatient Hospital Stay (HOSPITAL_COMMUNITY)
Admission: EM | Admit: 2022-05-10 | Discharge: 2022-05-18 | DRG: 689 | Disposition: A | Discharge status: Skilled Nursing Facility | Payer: Medicare Other | Source: Skilled Nursing Facility | Attending: Internal Medicine | Admitting: Internal Medicine

## 2022-05-10 DIAGNOSIS — Z96642 Presence of left artificial hip joint: Secondary | ICD-10-CM | POA: Diagnosis present

## 2022-05-10 DIAGNOSIS — R4182 Altered mental status, unspecified: Secondary | ICD-10-CM | POA: Diagnosis present

## 2022-05-10 DIAGNOSIS — F4010 Social phobia, unspecified: Secondary | ICD-10-CM | POA: Diagnosis present

## 2022-05-10 DIAGNOSIS — S32509S Unspecified fracture of unspecified pubis, sequela: Secondary | ICD-10-CM

## 2022-05-10 DIAGNOSIS — R17 Unspecified jaundice: Secondary | ICD-10-CM | POA: Diagnosis not present

## 2022-05-10 DIAGNOSIS — I10 Essential (primary) hypertension: Secondary | ICD-10-CM | POA: Diagnosis present

## 2022-05-10 DIAGNOSIS — K838 Other specified diseases of biliary tract: Secondary | ICD-10-CM | POA: Diagnosis present

## 2022-05-10 DIAGNOSIS — W1830XA Fall on same level, unspecified, initial encounter: Secondary | ICD-10-CM | POA: Diagnosis present

## 2022-05-10 DIAGNOSIS — Z23 Encounter for immunization: Secondary | ICD-10-CM | POA: Diagnosis present

## 2022-05-10 DIAGNOSIS — Q780 Osteogenesis imperfecta: Secondary | ICD-10-CM | POA: Diagnosis not present

## 2022-05-10 DIAGNOSIS — E785 Hyperlipidemia, unspecified: Secondary | ICD-10-CM | POA: Diagnosis present

## 2022-05-10 DIAGNOSIS — Z8673 Personal history of transient ischemic attack (TIA), and cerebral infarction without residual deficits: Secondary | ICD-10-CM

## 2022-05-10 DIAGNOSIS — K851 Biliary acute pancreatitis without necrosis or infection: Secondary | ICD-10-CM | POA: Diagnosis not present

## 2022-05-10 DIAGNOSIS — Z885 Allergy status to narcotic agent status: Secondary | ICD-10-CM | POA: Diagnosis not present

## 2022-05-10 DIAGNOSIS — F061 Catatonic disorder due to known physiological condition: Secondary | ICD-10-CM | POA: Diagnosis present

## 2022-05-10 DIAGNOSIS — F411 Generalized anxiety disorder: Secondary | ICD-10-CM | POA: Diagnosis present

## 2022-05-10 DIAGNOSIS — B9620 Unspecified Escherichia coli [E. coli] as the cause of diseases classified elsewhere: Secondary | ICD-10-CM | POA: Diagnosis present

## 2022-05-10 DIAGNOSIS — M810 Age-related osteoporosis without current pathological fracture: Secondary | ICD-10-CM | POA: Diagnosis present

## 2022-05-10 DIAGNOSIS — Z823 Family history of stroke: Secondary | ICD-10-CM

## 2022-05-10 DIAGNOSIS — D751 Secondary polycythemia: Secondary | ICD-10-CM | POA: Diagnosis present

## 2022-05-10 DIAGNOSIS — B964 Proteus (mirabilis) (morganii) as the cause of diseases classified elsewhere: Secondary | ICD-10-CM | POA: Diagnosis present

## 2022-05-10 DIAGNOSIS — R932 Abnormal findings on diagnostic imaging of liver and biliary tract: Secondary | ICD-10-CM | POA: Diagnosis not present

## 2022-05-10 DIAGNOSIS — S32502A Unspecified fracture of left pubis, initial encounter for closed fracture: Secondary | ICD-10-CM | POA: Diagnosis present

## 2022-05-10 DIAGNOSIS — Z8249 Family history of ischemic heart disease and other diseases of the circulatory system: Secondary | ICD-10-CM | POA: Diagnosis not present

## 2022-05-10 DIAGNOSIS — N3000 Acute cystitis without hematuria: Secondary | ICD-10-CM

## 2022-05-10 DIAGNOSIS — F05 Delirium due to known physiological condition: Secondary | ICD-10-CM | POA: Insufficient documentation

## 2022-05-10 DIAGNOSIS — F4000 Agoraphobia, unspecified: Secondary | ICD-10-CM | POA: Diagnosis present

## 2022-05-10 DIAGNOSIS — N390 Urinary tract infection, site not specified: Secondary | ICD-10-CM | POA: Diagnosis not present

## 2022-05-10 DIAGNOSIS — E162 Hypoglycemia, unspecified: Secondary | ICD-10-CM | POA: Diagnosis not present

## 2022-05-10 DIAGNOSIS — Z9181 History of falling: Secondary | ICD-10-CM

## 2022-05-10 DIAGNOSIS — K859 Acute pancreatitis without necrosis or infection, unspecified: Secondary | ICD-10-CM | POA: Diagnosis not present

## 2022-05-10 DIAGNOSIS — Z79899 Other long term (current) drug therapy: Secondary | ICD-10-CM

## 2022-05-10 DIAGNOSIS — Z820 Family history of epilepsy and other diseases of the nervous system: Secondary | ICD-10-CM

## 2022-05-10 DIAGNOSIS — G9341 Metabolic encephalopathy: Secondary | ICD-10-CM | POA: Diagnosis present

## 2022-05-10 DIAGNOSIS — F17210 Nicotine dependence, cigarettes, uncomplicated: Secondary | ICD-10-CM | POA: Diagnosis present

## 2022-05-10 DIAGNOSIS — Z91199 Patient's noncompliance with other medical treatment and regimen due to unspecified reason: Secondary | ICD-10-CM

## 2022-05-10 DIAGNOSIS — Y92009 Unspecified place in unspecified non-institutional (private) residence as the place of occurrence of the external cause: Secondary | ICD-10-CM | POA: Diagnosis not present

## 2022-05-10 DIAGNOSIS — F423 Hoarding disorder: Secondary | ICD-10-CM | POA: Diagnosis present

## 2022-05-10 DIAGNOSIS — F22 Delusional disorders: Principal | ICD-10-CM

## 2022-05-10 DIAGNOSIS — G40909 Epilepsy, unspecified, not intractable, without status epilepticus: Secondary | ICD-10-CM | POA: Diagnosis present

## 2022-05-10 LAB — CREATININE, SERUM
Creatinine, Ser: 0.8 mg/dL (ref 0.44–1.00)
GFR, Estimated: >60 mL/min (ref >60)

## 2022-05-10 LAB — URINALYSIS, ROUTINE W REFLEX MICROSCOPIC
Bilirubin Urine: NEGATIVE
Glucose, UA: NEGATIVE mg/dL
Hgb urine dipstick: NEGATIVE
Ketones, ur: 20 mg/dL — AB
Nitrite: NEGATIVE
Protein, ur: 100 mg/dL — AB
Specific Gravity, Urine: 1.025 (ref 1.005–1.030)
WBC, UA: >50 WBC/hpf — ABNORMAL HIGH (ref 0–5)
pH: 8 (ref 5.0–8.0)

## 2022-05-10 LAB — RAPID URINE DRUG SCREEN, HOSP PERFORMED
Amphetamines: NOT DETECTED
Barbiturates: NOT DETECTED
Benzodiazepines: NOT DETECTED
Cocaine: NOT DETECTED
Opiates: NOT DETECTED
Tetrahydrocannabinol: NOT DETECTED

## 2022-05-10 LAB — COMPREHENSIVE METABOLIC PANEL
ALT: 17 U/L (ref 0–44)
AST: 31 U/L (ref 15–41)
Albumin: 3.7 g/dL (ref 3.5–5.0)
Alkaline Phosphatase: 135 U/L — ABNORMAL HIGH (ref 38–126)
Anion gap: 14 (ref 5–15)
BUN: 23 mg/dL (ref 8–23)
CO2: 22 mmol/L (ref 22–32)
Calcium: 9.5 mg/dL (ref 8.9–10.3)
Chloride: 102 mmol/L (ref 98–111)
Creatinine, Ser: 0.85 mg/dL (ref 0.44–1.00)
GFR, Estimated: >60 mL/min (ref >60)
Glucose, Bld: 161 mg/dL — ABNORMAL HIGH (ref 70–99)
Potassium: 4 mmol/L (ref 3.5–5.1)
Sodium: 138 mmol/L (ref 135–145)
Total Bilirubin: 0.7 mg/dL (ref 0.3–1.2)
Total Protein: 7.7 g/dL (ref 6.5–8.1)

## 2022-05-10 LAB — CBC WITH DIFFERENTIAL/PLATELET
Abs Immature Granulocytes: 0.03 10*3/uL (ref 0.00–0.07)
Basophils Absolute: 0 10*3/uL (ref 0.0–0.1)
Basophils Relative: 0 %
Eosinophils Absolute: 0.1 10*3/uL (ref 0.0–0.5)
Eosinophils Relative: 1 %
HCT: 53.1 % — ABNORMAL HIGH (ref 36.0–46.0)
Hemoglobin: 17.2 g/dL — ABNORMAL HIGH (ref 12.0–15.0)
Immature Granulocytes: 0 %
Lymphocytes Relative: 22 %
Lymphs Abs: 2.4 10*3/uL (ref 0.7–4.0)
MCH: 31 pg (ref 26.0–34.0)
MCHC: 32.4 g/dL (ref 30.0–36.0)
MCV: 95.7 fL (ref 80.0–100.0)
Monocytes Absolute: 0.9 10*3/uL (ref 0.1–1.0)
Monocytes Relative: 9 %
Neutro Abs: 7.2 10*3/uL (ref 1.7–7.7)
Neutrophils Relative %: 68 %
Platelets: 253 10*3/uL (ref 150–400)
RBC: 5.55 MIL/uL — ABNORMAL HIGH (ref 3.87–5.11)
RDW: 12.8 % (ref 11.5–15.5)
WBC: 10.6 10*3/uL — ABNORMAL HIGH (ref 4.0–10.5)
nRBC: 0 % (ref 0.0–0.2)

## 2022-05-10 LAB — CBC
HCT: 42.6 % (ref 36.0–46.0)
Hemoglobin: 14 g/dL (ref 12.0–15.0)
MCH: 31.5 pg (ref 26.0–34.0)
MCHC: 32.9 g/dL (ref 30.0–36.0)
MCV: 95.7 fL (ref 80.0–100.0)
Platelets: 226 10*3/uL (ref 150–400)
RBC: 4.45 MIL/uL (ref 3.87–5.11)
RDW: 13 % (ref 11.5–15.5)
WBC: 9.8 10*3/uL (ref 4.0–10.5)
nRBC: 0 % (ref 0.0–0.2)

## 2022-05-10 LAB — ETHANOL: Alcohol, Ethyl (B): <10 mg/dL (ref <10)

## 2022-05-10 LAB — PHOSPHORUS: Phosphorus: 3 mg/dL (ref 2.5–4.6)

## 2022-05-10 LAB — LACTIC ACID, PLASMA
Lactic Acid, Venous: 3 mmol/L (ref 0.5–1.9)
Lactic Acid, Venous: 1.6 mmol/L (ref 0.5–1.9)

## 2022-05-10 LAB — TROPONIN I (HIGH SENSITIVITY)
Troponin I (High Sensitivity): 5 ng/L (ref <18)
Troponin I (High Sensitivity): 6 ng/L (ref <18)

## 2022-05-10 LAB — MAGNESIUM: Magnesium: 1.8 mg/dL (ref 1.7–2.4)

## 2022-05-10 LAB — AMMONIA: Ammonia: 22 umol/L (ref 9–35)

## 2022-05-10 LAB — TSH: TSH: 1.152 u[IU]/mL (ref 0.350–4.500)

## 2022-05-10 MED ORDER — POLYETHYLENE GLYCOL 3350 17 G PO PACK: 17.0000 g | PACK | Freq: Every day | ORAL | Status: DC | PRN

## 2022-05-10 MED ORDER — SODIUM CHLORIDE 0.9 % IV SOLN: INTRAVENOUS | Status: DC

## 2022-05-10 MED ORDER — SODIUM CHLORIDE 0.9 % IV SOLN
2.0000 g | INTRAVENOUS | Status: DC
  Administered 2022-05-11 – 2022-05-14 (×4): 2 g via INTRAVENOUS
  Filled 2022-05-10 (×4): qty 20

## 2022-05-10 MED ORDER — VITAMIN D (ERGOCALCIFEROL) 1.25 MG (50000 UNIT) PO CAPS
50000.0000 [IU] | ORAL_CAPSULE | ORAL | Status: DC
  Administered 2022-05-10 – 2022-05-17 (×2): 50000 [IU] via ORAL
  Filled 2022-05-10 (×2): qty 1

## 2022-05-10 MED ORDER — CALCIUM CARBONATE ANTACID 500 MG PO CHEW
1.0000 | CHEWABLE_TABLET | Freq: Two times a day (BID) | ORAL | Status: DC
  Administered 2022-05-10 – 2022-05-18 (×17): 200 mg via ORAL
  Filled 2022-05-10 (×17): qty 1

## 2022-05-10 MED ORDER — SODIUM CHLORIDE 0.9 % IV SOLN
1.0000 g | Freq: Once | INTRAVENOUS | Status: AC
  Administered 2022-05-10: 1 g via INTRAVENOUS
  Filled 2022-05-10: qty 10

## 2022-05-10 MED ORDER — ENOXAPARIN SODIUM 40 MG/0.4ML IJ SOSY
40.0000 mg | PREFILLED_SYRINGE | INTRAMUSCULAR | Status: DC
  Administered 2022-05-10 – 2022-05-18 (×9): 40 mg via SUBCUTANEOUS
  Filled 2022-05-10 (×9): qty 0.4

## 2022-05-10 MED ORDER — OXYCODONE HCL 5 MG PO TABS
5.0000 mg | ORAL_TABLET | Freq: Four times a day (QID) | ORAL | Status: DC | PRN
  Administered 2022-05-10 – 2022-05-18 (×14): 5 mg via ORAL
  Filled 2022-05-10 (×14): qty 1

## 2022-05-10 MED ORDER — FOLIC ACID 1 MG PO TABS
1.0000 mg | ORAL_TABLET | Freq: Every day | ORAL | Status: DC
  Administered 2022-05-10 – 2022-05-18 (×9): 1 mg via ORAL
  Filled 2022-05-10 (×9): qty 1

## 2022-05-10 MED ORDER — LEVETIRACETAM 500 MG PO TABS
500.0000 mg | ORAL_TABLET | Freq: Two times a day (BID) | ORAL | Status: DC
  Administered 2022-05-10 – 2022-05-18 (×17): 500 mg via ORAL
  Filled 2022-05-10 (×17): qty 1

## 2022-05-10 MED ORDER — SODIUM CHLORIDE 0.9 % IV BOLUS
1000.0000 mL | Freq: Once | INTRAVENOUS | Status: AC
  Administered 2022-05-10: 1000 mL via INTRAVENOUS

## 2022-05-10 MED ORDER — THIAMINE MONONITRATE 100 MG PO TABS
100.0000 mg | ORAL_TABLET | Freq: Every day | ORAL | Status: DC
  Administered 2022-05-10 – 2022-05-18 (×9): 100 mg via ORAL
  Filled 2022-05-10 (×10): qty 1

## 2022-05-10 MED ORDER — SENNOSIDES-DOCUSATE SODIUM 8.6-50 MG PO TABS
1.0000 | ORAL_TABLET | Freq: Every day | ORAL | Status: DC
  Administered 2022-05-10: 1 via ORAL
  Filled 2022-05-10: qty 1

## 2022-05-10 MED ORDER — CALCITONIN (SALMON) 200 UNIT/ACT NA SOLN
1.0000 | Freq: Every day | NASAL | Status: DC
  Administered 2022-05-10 – 2022-05-18 (×9): 1 via NASAL
  Filled 2022-05-10: qty 3.7

## 2022-05-10 MED ORDER — PROCHLORPERAZINE EDISYLATE 10 MG/2ML IJ SOLN
5.0000 mg | Freq: Four times a day (QID) | INTRAMUSCULAR | Status: DC | PRN
  Administered 2022-05-16: 5 mg via INTRAVENOUS
  Filled 2022-05-10: qty 2

## 2022-05-10 MED ORDER — MELATONIN 5 MG PO TABS
5.0000 mg | ORAL_TABLET | Freq: Every evening | ORAL | Status: DC | PRN
  Administered 2022-05-10 – 2022-05-17 (×5): 5 mg via ORAL
  Filled 2022-05-10 (×5): qty 1

## 2022-05-10 MED ORDER — SODIUM CHLORIDE 0.9 % IV SOLN: Freq: Once | INTRAVENOUS | Status: AC

## 2022-05-10 MED ORDER — ACETAMINOPHEN 325 MG PO TABS
650.0000 mg | ORAL_TABLET | Freq: Four times a day (QID) | ORAL | Status: DC | PRN
  Administered 2022-05-16 – 2022-05-17 (×4): 650 mg via ORAL
  Filled 2022-05-10 (×5): qty 2

## 2022-05-10 NOTE — ED Notes (Signed)
ED TO INPATIENT HANDOFF REPORT  ED Nurse Name and Phone #: Griffyn Kucinski RN [phone] Name/Age/Gender Rose Bridges 62 y.o. female Room/Bed: 023C/023C  Code Status   Code Status: Full Code  Home/SNF/Other Rehab Patient oriented to: self, place, time, and situation Is this baseline? Yes   Triage Complete: Triage complete  Chief Complaint AMS (altered mental status) [R41.82]  Triage Note Patient arrives via EMS from [facility]. Per staff at [facility] place patient has been saying things that don't make sense stating that people are trying to cut her toes off. Patient arrives a/ox4 and is also complaining of left hip pain. Staff at facility also states that patient has been refusing to take her medications the past couple days. BP 126/90, HR 125, CBG 241 and 96% on room air.    Allergies Allergies  Allergen Reactions   Codeine Other (See Comments)    Migraine     Level of Care/Admitting Diagnosis ED Disposition     ED Disposition  Admit   Condition  --   Comment  Hospital Area: [place] [[id number]]  Level of Care: Telemetry Medical [104]  May admit patient to [name] or [name] if equivalent level of care is available:: Yes  Covid Evaluation: Asymptomatic - no recent exposure (last 10 days) testing not required  Diagnosis: AMS (altered mental status) [0102725]  Admitting Physician: Kayleen Memos [[id number]]  Attending Physician: Kayleen Memos [[id number]]  Certification:: I certify this patient will need inpatient services for at least 2 midnights  Estimated Length of Stay: 2          B Medical/Surgery History Past Medical History:  Diagnosis Date   Hyperlipemia    Hypertension    Osteogenesis imperfecta    Osteoporosis    Past Surgical History:  Procedure Laterality Date   ABDOMINAL HYSTERECTOMY     HIP ARTHROPLASTY Left 09/26/2015   Procedure: ARTHROPLASTY BIPOLAR HIP (HEMIARTHROPLASTY);  Surgeon: Renette Butters, MD;  Location: [place];  Service: Orthopedics;  Laterality: Left;   ORIF HUMERUS FRACTURE Right 11/06/2016   Procedure: OPEN REDUCTION INTERNAL FIXATION (ORIF) PROXIMAL HUMERUS FRACTURE;  Surgeon: Renette Butters, MD;  Location: [place];  Service: Orthopedics;  Laterality: Right;     A IV Location/Drains/Wounds Patient Lines/Drains/Airways Status     Active Line/Drains/Airways     Name Placement date Placement time Site Days   Peripheral IV 05/10/22 20 G Anterior;Proximal;Right Antecubital 05/10/22  1525  Antecubital  less than 1   External Urinary Catheter 05/10/22  1624  --  less than 1            Intake/Output Last 24 hours No intake or output data in the 24 hours ending 05/10/22 2026  Labs/Imaging Results for orders placed or performed during the hospital encounter of 05/10/22 (from the past 48 hour(s))  Comprehensive metabolic panel     Status: Abnormal   Collection Time: 05/10/22  3:21 PM  Result Value Ref Range   Sodium 138 135 - 145 mmol/L   Potassium 4.0 3.5 - 5.1 mmol/L   Chloride 102 98 - 111 mmol/L   CO2 22 22 - 32 mmol/L   Glucose, Bld 161 (H) 70 - 99 mg/dL    Comment: Glucose reference range applies only to samples taken after fasting for at least 8 hours.   BUN 23 8 - 23 mg/dL   Creatinine, Ser 0.85 0.44 - 1.00 mg/dL   Calcium 9.5 8.9 - 10.3 mg/dL  Total Protein 7.7 6.5 - 8.1 g/dL   Albumin 3.7 3.5 - 5.0 g/dL   AST 31 15 - 41 U/L   ALT 17 0 - 44 U/L   Alkaline Phosphatase 135 (H) 38 - 126 U/L   Total Bilirubin 0.7 0.3 - 1.2 mg/dL   GFR, Estimated >60 >60 mL/min    Comment: (NOTE) Calculated using the CKD-EPI Creatinine Equation (2021)    Anion gap 14 5 - 15    Comment: Performed at [facility] [address], [place], [place] [address]  Ethanol     Status: None   Collection Time: 05/10/22  3:21 PM  Result Value Ref Range   Alcohol, Ethyl (B) <10 <10 mg/dL    Comment: (NOTE) Lowest detectable limit for serum alcohol is 10 mg/dL.  For medical purposes  only. Performed at [facility], [place] [address], [place], [place] [address]   Lactic acid, plasma     Status: Abnormal   Collection Time: 05/10/22  3:21 PM  Result Value Ref Range   Lactic Acid, Venous 3.0 (HH) 0.5 - 1.9 mmol/L    Comment: CRITICAL RESULT CALLED TO, READ BACK BY AND VERIFIED WITH A.Miyo Aina,RN @1617  05/10/2022 [email or web address] Performed at [facility], [place] [address], [place], [place] [address]   Troponin I (High Sensitivity)     Status: None   Collection Time: 05/10/22  3:21 PM  Result Value Ref Range   Troponin I (High Sensitivity) 5 <18 ng/L    Comment: (NOTE) Elevated high sensitivity troponin I (hsTnI) values and significant  changes across serial measurements may suggest ACS but many other  chronic and acute conditions are known to elevate hsTnI results.  Refer to the "Links" section for chest pain algorithms and additional  guidance. Performed at [facility], [place] [address], [place], [place] [address]   CBC with Differential     Status: Abnormal   Collection Time: 05/10/22  3:21 PM  Result Value Ref Range   WBC 10.6 (H) 4.0 - 10.5 K/uL   RBC 5.55 (H) 3.87 - 5.11 MIL/uL   Hemoglobin 17.2 (H) 12.0 - 15.0 g/dL   HCT 53.1 (H) 36.0 - 46.0 %   MCV 95.7 80.0 - 100.0 fL   MCH 31.0 26.0 - 34.0 pg   MCHC 32.4 30.0 - 36.0 g/dL   RDW 12.8 11.5 - 15.5 %   Platelets 253 150 - 400 K/uL   nRBC 0.0 0.0 - 0.2 %   Neutrophils Relative % 68 %   Neutro Abs 7.2 1.7 - 7.7 K/uL   Lymphocytes Relative 22 %   Lymphs Abs 2.4 0.7 - 4.0 K/uL   Monocytes Relative 9 %   Monocytes Absolute 0.9 0.1 - 1.0 K/uL   Eosinophils Relative 1 %   Eosinophils Absolute 0.1 0.0 - 0.5 K/uL   Basophils Relative 0 %   Basophils Absolute 0.0 0.0 - 0.1 K/uL   Immature Granulocytes 0 %   Abs Immature Granulocytes 0.03 0.00 - 0.07 K/uL    Comment: Performed at [facility] [address], [place], [place] [address]  Magnesium     Status: None   Collection Time: 05/10/22  3:21  PM  Result Value Ref Range   Magnesium 1.8 1.7 - 2.4 mg/dL    Comment: Performed at [facility] [address] [place], [place] [address]  Phosphorus     Status: None   Collection Time: 05/10/22  3:21 PM  Result Value Ref Range   Phosphorus 3.0 2.5 - 4.6 mg/dL    Comment: Performed at [facility], [address] [place], [place] [address]  Ammonia     Status: None   Collection Time: 05/10/22  3:21 PM  Result Value Ref Range   Ammonia 22 9 - 35 umol/L    Comment: HEMOLYSIS AT THIS LEVEL MAY AFFECT RESULT Performed at [facility], [address] [place], [place] [address]   Lactic acid, plasma     Status: None   Collection Time: 05/10/22  4:39 PM  Result Value Ref Range   Lactic Acid, Venous 1.6 0.5 - 1.9 mmol/L    Comment: Performed at [facility], [address], [place], [place] [address]  Urinalysis, Routine w reflex microscopic Urine, Clean Catch     Status: Abnormal   Collection Time: 05/10/22  4:39 PM  Result Value Ref Range   Color, Urine AMBER (A) YELLOW    Comment: BIOCHEMICALS MAY BE AFFECTED BY COLOR   APPearance CLOUDY (A) CLEAR   Specific Gravity, Urine 1.025 1.005 - 1.030   pH 8.0 5.0 - 8.0   Glucose, UA NEGATIVE NEGATIVE mg/dL   Hgb urine dipstick NEGATIVE NEGATIVE   Bilirubin Urine NEGATIVE NEGATIVE   Ketones, ur 20 (A) NEGATIVE mg/dL   Protein, ur 127 (A) NEGATIVE mg/dL   Nitrite NEGATIVE NEGATIVE   Leukocytes,Ua LARGE (A) NEGATIVE   RBC / HPF 21-50 0 - 5 RBC/hpf   WBC, UA >50 (H) 0 - 5 WBC/hpf   Bacteria, UA MANY (A) NONE SEEN   Squamous Epithelial / HPF 0-5 0 - 5 /HPF   Triple Phosphate Crystal PRESENT     Comment: Performed at [facility], [address], [place], [place] [address]  Urine rapid drug screen (hosp performed)     Status: None   Collection Time: 05/10/22  4:39 PM  Result Value Ref Range   Opiates NONE DETECTED NONE DETECTED   Cocaine NONE DETECTED NONE DETECTED   Benzodiazepines NONE DETECTED NONE  DETECTED   Amphetamines NONE DETECTED NONE DETECTED   Tetrahydrocannabinol NONE DETECTED NONE DETECTED   Barbiturates NONE DETECTED NONE DETECTED    Comment: (NOTE) DRUG SCREEN FOR MEDICAL PURPOSES ONLY.  IF CONFIRMATION IS NEEDED FOR ANY PURPOSE, NOTIFY LAB WITHIN 5 DAYS.  LOWEST DETECTABLE LIMITS FOR URINE DRUG SCREEN Drug Class                     Cutoff (ng/mL) Amphetamine and metabolites    1000 Barbiturate and metabolites    200 Benzodiazepine                 200 Opiates and metabolites        300 Cocaine and metabolites        300 THC                            50 Performed at [facility], [address], [place], [place] [address]   Troponin I (High Sensitivity)     Status: None   Collection Time: 05/10/22  4:39 PM  Result Value Ref Range   Troponin I (High Sensitivity) 6 <18 ng/L    Comment: (NOTE) Elevated high sensitivity troponin I (hsTnI) values and significant  changes across serial measurements may suggest ACS but many other  chronic and acute conditions are known to elevate hsTnI  results.  Refer to the "Links" section for chest pain algorithms and additional  guidance. Performed at [facility], [address] [place], [place] [address]    DG Chest Port 1 View  Result Date: 05/10/2022 CLINICAL DATA:  Altered mental status EXAM: PORTABLE CHEST 1 VIEW COMPARISON:  04/19/2018 FINDINGS: The lungs appear clear. Cardiac and mediastinal contours normal. No pleural effusion identified. Unchanged appearance of the right proximal humeral fixator. There is a suggestion of deformity of the right distal clavicle, likely from a remote healed fracture. IMPRESSION: 1. No active cardiopulmonary disease is radiographically apparent. 2. Suspected remote healed fracture of the right distal clavicle. Electronically Signed   By: Gaylyn Rong M.D.   On: 05/10/2022 15:59   CT Head Wo Contrast  Result Date: 05/10/2022 CLINICAL DATA:  Altered mental status EXAM: CT  HEAD WITHOUT CONTRAST TECHNIQUE: Contiguous axial images were obtained from the base of the skull through the vertex without intravenous contrast. RADIATION DOSE REDUCTION: This exam was performed according to the departmental dose-optimization program which includes automated exposure control, adjustment of the mA and/or kV according to patient size and/or use of iterative reconstruction technique. COMPARISON:  CT head 11/05/16, MRI head 11/08/16 FINDINGS: Brain: No evidence of acute infarction, hemorrhage, hydrocephalus, extra-axial collection or mass lesion/mass effect. There is sequela of severe chronic microvascular ischemic change with multiple superimposed basal ganglia infarcts. 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 Skull: Normal. Negative for fracture or focal lesion. Sinuses/Orbits: Likely bilateral maxillary sinus mucous retention cysts. Paranasal sinuses are otherwise clear. Orbits are unremarkable. No mastoid or middle ear effusion. Other: None IMPRESSION: 1. 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 2. No intracranial hemorrhage. 3. Sequela of severe chronic microvascular ischemic change with multiple superimposed basal ganglia infarcts. Electronically Signed   By: Lorenza Cambridge M.D.   On: 05/10/2022 15:14    Pending Labs Unresulted Labs (From admission, onward)     Start     Ordered   05/17/22 0500  Creatinine, serum  (enoxaparin (LOVENOX)    CrCl >/= 30 ml/min)  Weekly,   R     Comments: while on enoxaparin therapy    05/10/22 2019   05/10/22 2019  CBC  (enoxaparin (LOVENOX)    CrCl >/= 30 ml/min)  Once,   R       Comments: Baseline for enoxaparin therapy IF NOT ALREADY DRAWN.  Notify MD if PLT < 100 K.    05/10/22 2019   05/10/22 2019  Creatinine, serum  (enoxaparin (LOVENOX)    CrCl >/= 30 ml/min)  Once,   R       Comments: Baseline for enoxaparin therapy  IF NOT ALREADY DRAWN.    05/10/22 2019   05/10/22 1848  Urine Culture  (Urine Culture)  Once,   URGENT       Question:  Indication  Answer:  Altered mental status (if no other cause identified)   05/10/22 1847   05/10/22 1433  TSH  Once,   URGENT        05/10/22 1432   05/10/22 1432  TSH  Once,   URGENT        05/10/22 1432            Vitals/Pain Today's Vitals   05/10/22 1717 05/10/22 1730 05/10/22 1800 05/10/22 1830  BP:  105/75 124/68 110/81  Pulse:  81 80 80  Resp:  18 19 20   Temp: 98 F (36.7 C)     TempSrc: Oral     SpO2:  100% 98% 99%  Weight:      Height:      PainSc:        Isolation Precautions No active isolations  Medications Medications  cefTRIAXone (ROCEPHIN) 1 g in sodium chloride 0.9 % 100 mL IVPB (has no administration in time range)  enoxaparin (LOVENOX) injection 40 mg (has no administration in time range)  acetaminophen (TYLENOL) tablet 650 mg (has no administration in time range)  prochlorperazine (COMPAZINE) injection 5 mg (has no administration in time range)  melatonin tablet 5 mg (has no administration in time range)  0.9 %  sodium chloride infusion ( Intravenous New Bag/Given 05/10/22 1541)  sodium chloride 0.9 % bolus 1,000 mL (0 mLs Intravenous Stopped 05/10/22 1921)    Mobility walks with device High fall risk   Focused Assessments Cardiac Assessment Handoff:    No results found for: "CKTOTAL", "CKMB", "CKMBINDEX", "TROPONINI" Lab Results  Component Value Date   DDIMER 2.62 (H) 11/05/2016   Does the Patient currently have chest pain? No   , Pulmonary Assessment Handoff:  Lung sounds:          R Recommendations: See Admitting Provider Note  Report given to:   Additional Notes:

## 2022-05-10 NOTE — ED Provider Notes (Signed)
Patient signed out to me at 1500 by Dr. Vallery Ridge pending labs and reassessment.  In short this is a 62 year old female who presented from SNF where she was being treated for pelvic fracture with concern for psychosis.  The patient had been hostile and rambling at the SNF.  She has no reported history on her documentation of any mental illness and is not prescribed any medications for paranoid disorders and has no diagnosis of dementia.  She was started on baclofen after her hip fracture.  Patient is undergoing labs and CT imaging to determine metabolic etiologies of psychosis and will be closely reassessed.  Clinical Course as of 05/10/22 1919  Thu May 10, 2022  1918 UA positive for UTI which is likely to explain her symptoms.  She will be started on ceftriaxone and will require admission for her altered mental status in the setting of UTI. [VK]    Clinical Course User Index [VK] Kemper Durie, DO      Kemper Durie, [place] 05/10/22 1919

## 2022-05-10 NOTE — ED Triage Notes (Signed)
Patient arrives via EMS from [facility]. Per staff at [facility] place patient has been saying things that don't make sense stating that people are trying to cut her toes off. Patient arrives a/ox4 and is also complaining of left hip pain. Staff at facility also states that patient has been refusing to take her medications the past couple days. BP 126/90, HR 125, CBG 241 and 96% on room air.

## 2022-05-10 NOTE — H&P (Signed)
History and Physical  Rose Bridges IRW:[id number] DOB: [birth year] DOA: 05/10/2022  Referring physician: Dr. Maylon Peppers, [place]. PCP: Antony Blackbird, MD  Outpatient Specialists: None Patient coming from: SNF  Chief Complaint: Altered mental status, paranoia, agitation.  HPI: Rose Bridges is a 62 y.o. female with medical history significant for polycythemia, osteogenesis imperfecta, osteoporosis, hypertension, hyperlipidemia, seizure disorder, history of falls, recent pelvic fracture after mechanical fall on 03/31/2022 in her Home resulting in pelvic fracture, recently admitted by family medicine residency teaching service on 04/03/22 and discharged on 04/07/22 (Not FM patient, per resident Dr. Nancy Fetter) sent from SNF due to recent behavioral change, paranoid and agitation with pressured speech.  In the ED, workup revealed UA positive for pyuria.  Head CT is notable for a new age-indeterminate left cerebellar infarct compared to 2018, no intracranial hemorrhage.  Sequela of severe chronic microvascular ischemic change with multiple superimposed basal ganglia infarcts.    Urine culture was ordered, and the patient was started on Rocephin empirically in the ED.  Additionally she received 1 L IV fluid bolus NS.  And IV fluid maintenance was initiated NS at 125 cc/h.  At the time of the visit, her mentation had improved, however, she was unsure of the circumstances that led to her ED presentation.  ED Course: Tmax 98.  BP 120/109, pulse 86, respiration rate 19, O2 saturation 98% on room air.  Lab studies remarkable for serum glucose 161, alkaline phosphatase to 135.  Lactic acid 3.0, 1.6.  WBC 10.6, hemoglobin 17.2, platelet 253.  Review of Systems: Review of systems as noted in the HPI. All other systems reviewed and are negative.   Past Medical History:  Diagnosis Date   Hyperlipemia    Hypertension    Osteogenesis imperfecta    Osteoporosis    Past Surgical History:  Procedure Laterality Date    ABDOMINAL HYSTERECTOMY     HIP ARTHROPLASTY Left 09/26/2015   Procedure: ARTHROPLASTY BIPOLAR HIP (HEMIARTHROPLASTY);  Surgeon: Renette Butters, MD;  Location: [place];  Service: Orthopedics;  Laterality: Left;   ORIF HUMERUS FRACTURE Right 11/06/2016   Procedure: OPEN REDUCTION INTERNAL FIXATION (ORIF) PROXIMAL HUMERUS FRACTURE;  Surgeon: Renette Butters, MD;  Location: [place];  Service: Orthopedics;  Laterality: Right;    Social History:  reports that she has been smoking. She has been smoking an average of .25 packs per day. She has never used smokeless tobacco. She reports that she does not drink alcohol and does not use drugs.   Allergies  Allergen Reactions   Codeine Other (See Comments)    Migraine     Family History  Problem Relation Age of Onset   Stroke Mother    Heart attack Mother    Hypertension Mother    Stroke Sister    Stroke Brother    Lung cancer Father       Prior to Admission medications   Medication Sig Start Date End Date Taking? Authorizing Provider  acetaminophen (TYLENOL) 500 MG tablet Take 1,000 mg by mouth 3 (three) times daily. 04/10/22  Yes [provider]  Baclofen 5 MG TABS Take 1 tablet by mouth every 8 (eight) hours as needed for muscle spasms. 04/07/22  Yes Salvadore Oxford, MD  Baclofen 5 MG TABS Take 10 mg by mouth 2 (two) times daily. 05/02/22 05/17/22 Yes [provider]  calcitonin, salmon, (MIACALCIN/FORTICAL) 200 UNIT/ACT nasal spray Place 1 spray into alternate nostrils daily. 04/08/22  Yes Salvadore Oxford, MD  calcium carbonate (TUMS - DOSED IN MG  ELEMENTAL CALCIUM) 500 MG chewable tablet Chew 1 tablet by mouth 2 (two) times daily.   Yes [provider]  folic acid (FOLVITE) 1 MG tablet Take 1 tablet (1 mg total) by mouth daily. 09/09/19  Yes Storm Frisk, MD  oxyCODONE (OXY IR/ROXICODONE) 5 MG immediate release tablet Take 1 tablet (5 mg total) by mouth every 4 (four) hours as needed for severe pain or  breakthrough pain. Patient taking differently: Take 5 mg by mouth every 8 (eight) hours. 04/07/22  Yes Shelby Mattocks, DO  thiamine 100 MG tablet Take 1 tablet (100 mg total) by mouth daily. 09/09/19  Yes Storm Frisk, MD  Vitamin D, Ergocalciferol, (DRISDOL) 1.25 MG (50000 UNIT) CAPS capsule Take 1 capsule (50,000 Units total) by mouth every 7 (seven) days. Patient taking differently: Take 50,000 Units by mouth every 7 (seven) days. Thursdays 04/12/22  Yes Celine Mans, MD  acetaminophen (TYLENOL) 325 MG tablet Take 2 tablets (650 mg total) by mouth every 6 (six) hours. Patient not taking: Reported on 05/10/2022 04/07/22   Celine Mans, MD    Physical Exam: BP 110/81   Pulse 80   Temp 98 F (36.7 C) (Oral)   Resp 20   Ht 5\' 4"  (1.626 m)   Wt 59 kg   SpO2 99%   BMI 22.31 kg/m   General: 62 y.o. year-old female well developed well nourished in no acute distress.  Alert and oriented x3. Cardiovascular: Regular rate and rhythm with no rubs or gallops.  No thyromegaly or JVD noted.  No lower extremity edema. 2/4 pulses in all 4 extremities. Respiratory: Clear to auscultation with no wheezes or rales. Good inspiratory effort. Abdomen: Soft nontender nondistended with normal bowel sounds x4 quadrants. Muskuloskeletal: No cyanosis, clubbing or edema noted bilaterally Neuro: CN II-XII intact, strength, sensation, reflexes Skin: No ulcerative lesions noted or rashes Psychiatry: Judgement and insight appear normal. Mood is appropriate for condition and setting          Labs on Admission:  Basic Metabolic Panel: Recent Labs  Lab 05/10/22 1521  NA 138  K 4.0  CL 102  CO2 22  GLUCOSE 161*  BUN 23  CREATININE 0.85  CALCIUM 9.5  MG 1.8  PHOS 3.0   Liver Function Tests: Recent Labs  Lab 05/10/22 1521  AST 31  ALT 17  ALKPHOS 135*  BILITOT 0.7  PROT 7.7  ALBUMIN 3.7   No results for input(s): "LIPASE", "AMYLASE" in the last 168 hours. Recent Labs  Lab  05/10/22 1521  AMMONIA 22   CBC: Recent Labs  Lab 05/10/22 1521  WBC 10.6*  NEUTROABS 7.2  HGB 17.2*  HCT 53.1*  MCV 95.7  PLT 253   Cardiac Enzymes: No results for input(s): "CKTOTAL", "CKMB", "CKMBINDEX", "TROPONINI" in the last 168 hours.  BNP (last 3 results) No results for input(s): "BNP" in the last 8760 hours.  ProBNP (last 3 results) No results for input(s): "PROBNP" in the last 8760 hours.  CBG: No results for input(s): "GLUCAP" in the last 168 hours.  Radiological Exams on Admission: DG Chest Port 1 View  Result Date: 05/10/2022 CLINICAL DATA:  Altered mental status EXAM: PORTABLE CHEST 1 VIEW COMPARISON:  04/19/2018 FINDINGS: The lungs appear clear. Cardiac and mediastinal contours normal. No pleural effusion identified. Unchanged appearance of the right proximal humeral fixator. There is a suggestion of deformity of the right distal clavicle, likely from a remote healed fracture. IMPRESSION: 1. No active cardiopulmonary disease is radiographically apparent. 2.  Suspected remote healed fracture of the right distal clavicle. Electronically Signed   By: Van Clines M.D.   On: 05/10/2022 15:59   CT Head Wo Contrast  Result Date: 05/10/2022 CLINICAL DATA:  Altered mental status EXAM: CT HEAD WITHOUT CONTRAST TECHNIQUE: Contiguous axial images were obtained from the base of the skull through the vertex without intravenous contrast. RADIATION DOSE REDUCTION: This exam was performed according to the departmental dose-optimization program which includes automated exposure control, adjustment of the mA and/or kV according to patient size and/or use of iterative reconstruction technique. COMPARISON:  CT head 11/05/16, MRI head 11/08/16 FINDINGS: Brain: No evidence of acute infarction, hemorrhage, hydrocephalus, extra-axial collection or mass lesion/mass effect. There is sequela of severe chronic microvascular ischemic change with multiple superimposed basal ganglia infarcts.  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 Skull: Normal. Negative for fracture or focal lesion. Sinuses/Orbits: Likely bilateral maxillary sinus mucous retention cysts. Paranasal sinuses are otherwise clear. Orbits are unremarkable. No mastoid or middle ear effusion. Other: None IMPRESSION: 1. 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 2. No intracranial hemorrhage. 3. Sequela of severe chronic microvascular ischemic change with multiple superimposed basal ganglia infarcts. Electronically Signed   By: Marin Roberts M.D.   On: 05/10/2022 15:14    EKG: I independently viewed the EKG done and my findings are as followed: Sinus rhythm rate of 88.  Nonspecific ST-T changes.  QTc 438.  Assessment/Plan Present on Admission:  AMS (altered mental status)  Principal Problem:   AMS (altered mental status)  Acute metabolic encephalopathy in the setting of UTI, POA CT head nonacute, revealing a new age-indeterminate left cerebellar infarct compared to 2018 no intracranial hemorrhage.  Sequela of severe chronic microvascular ischemic change with multiple superimposed basal ganglia infarcts.   UA positive for pyuria.  Urine culture was ordered, and the patient was started on Rocephin empirically in the ED, continue.   Treat underlying conditions  Agitation delirium, improved Treat underlying conditions Delirium precautions Regulate sleep and wake cycle Fall precautions  Sequela of severe chronic microvascular ischemic change with multiple superimposed basal ganglia infarcts Remote basal ganglia lacunar type infarcts, seen in 2019 noncontrast CT head.  Seizure disorder with noncompliance with AEDs. Endorses no seizures activity for the past 3 years. Restart home AEDs, Keppra 500 mg twice daily Seizure precautions Will need to follow-up with neurology outpatient  Osteogenesis  imperfecta Resume home regimen  Polycythemia, unclear if polycythemia vera versus secondary polycythemia from dehydration and smoking Continue gentle IV fluid hydration Repeat CBC in the morning.     DVT prophylaxis: Subcu Lovenox daily  Code Status: Full code  Family Communication: None at bedside  Disposition Plan: Admitted to telemetry medical unit  Consults called: None  Admission status: Inpatient status   Status is: Inpatient The patient will require at least 2 midnights for further evaluation and treatment of present condition.   Kayleen Memos MD [facility] Pager [phone]  If 7PM-7AM, please contact night-coverage [email or web address] Password [facility]  05/10/2022, 7:42 PM
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
## 2022-05-10 NOTE — ED Notes (Signed)
ED Provider at bedside. 
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
## 2022-05-10 NOTE — ED Notes (Signed)
This RN spoke with microbiology who will add on urine culture to urine sample that was sent down at this time.

## 2022-05-11 DIAGNOSIS — R4182 Altered mental status, unspecified: Secondary | ICD-10-CM | POA: Diagnosis not present

## 2022-05-11 LAB — CBC
HCT: 37.4 % (ref 36.0–46.0)
Hemoglobin: 12.4 g/dL (ref 12.0–15.0)
MCH: 31.8 pg (ref 26.0–34.0)
MCHC: 33.2 g/dL (ref 30.0–36.0)
MCV: 95.9 fL (ref 80.0–100.0)
Platelets: 211 10*3/uL (ref 150–400)
RBC: 3.9 MIL/uL (ref 3.87–5.11)
RDW: 13.1 % (ref 11.5–15.5)
WBC: 7.6 10*3/uL (ref 4.0–10.5)
nRBC: 0 % (ref 0.0–0.2)

## 2022-05-11 LAB — BASIC METABOLIC PANEL
Anion gap: 7 (ref 5–15)
BUN: 17 mg/dL (ref 8–23)
CO2: 25 mmol/L (ref 22–32)
Calcium: 7.8 mg/dL — ABNORMAL LOW (ref 8.9–10.3)
Chloride: 110 mmol/L (ref 98–111)
Creatinine, Ser: 0.61 mg/dL (ref 0.44–1.00)
GFR, Estimated: >60 mL/min (ref >60)
Glucose, Bld: 120 mg/dL — ABNORMAL HIGH (ref 70–99)
Potassium: 4 mmol/L (ref 3.5–5.1)
Sodium: 142 mmol/L (ref 135–145)

## 2022-05-11 LAB — MAGNESIUM: Magnesium: 1.5 mg/dL — ABNORMAL LOW (ref 1.7–2.4)

## 2022-05-11 MED ORDER — MAGNESIUM SULFATE 2 GM/50ML IV SOLN
2.0000 g | Freq: Once | INTRAVENOUS | Status: AC
  Administered 2022-05-11: 2 g via INTRAVENOUS
  Filled 2022-05-11: qty 50

## 2022-05-11 MED ORDER — PNEUMOCOCCAL 20-VAL CONJ VACC 0.5 ML IM SUSY
0.5000 mL | PREFILLED_SYRINGE | INTRAMUSCULAR | Status: AC
  Administered 2022-05-12: 0.5 mL via INTRAMUSCULAR
  Filled 2022-05-11: qty 0.5

## 2022-05-11 MED ORDER — BISACODYL 10 MG RE SUPP: 10.0000 mg | Freq: Every day | RECTAL | Status: DC | PRN

## 2022-05-11 MED ORDER — ACETAMINOPHEN 500 MG PO TABS
1000.0000 mg | ORAL_TABLET | Freq: Three times a day (TID) | ORAL | Status: DC
  Administered 2022-05-11 – 2022-05-13 (×5): 1000 mg via ORAL
  Filled 2022-05-11 (×5): qty 2

## 2022-05-11 MED ORDER — LIDOCAINE 5 % EX PTCH
1.0000 | MEDICATED_PATCH | Freq: Every day | CUTANEOUS | Status: DC
  Administered 2022-05-11 – 2022-05-18 (×7): 1 via TRANSDERMAL
  Filled 2022-05-11 (×7): qty 1

## 2022-05-11 MED ORDER — SENNOSIDES-DOCUSATE SODIUM 8.6-50 MG PO TABS
1.0000 | ORAL_TABLET | Freq: Two times a day (BID) | ORAL | Status: DC
  Administered 2022-05-11 – 2022-05-18 (×16): 1 via ORAL
  Filled 2022-05-11 (×16): qty 1

## 2022-05-11 MED ORDER — ORAL CARE MOUTH RINSE: 15.0000 mL | OROMUCOSAL | Status: DC | PRN

## 2022-05-11 MED ORDER — POLYETHYLENE GLYCOL 3350 17 G PO PACK
17.0000 g | PACK | Freq: Every day | ORAL | Status: DC | PRN
  Administered 2022-05-11: 17 g via ORAL
  Filled 2022-05-11: qty 1

## 2022-05-11 NOTE — Progress Notes (Addendum)
pt is completely apathetic to my assessment this morning states she knows when she needs to pee and stool but insists on wearing a brief despite education on risk for skin breakdown I educated her on importance of mobility for pain control and emphasized importance of getting up to use the bathroom each time; she states she will not be doing that while in the hospital, then goes on to describe a variety of pains across multiple locations (jaw, ear, knee, foot)  doesn't want to participate quite argumentative "You turned off my phone, I didn't say you could do that" but wants to continue arguing nursing rational over the volume of her streaming content states she hasn't taken keppra x 46yrs but doesn't "care" if she takes it today or not   Pt is oriented x 4 and able to tell me what each of her medications is for, but doubts that she needs any of them Hyperverbal Continues to c/o pain while refusing any mobility/position changes/nonRx modalities  1500: revisited discussion of brief during bed change and bath Pt at once states she is willing to get OOB to void, but also insists on keeping a brief. I explain to her that her logic is not linear, and she becomes tearful, citing the need to build trust after her experience at [facility]. I again encouraged her to mobilize, but she declines. She states that she did not decline mobility with PT, but that the therapist told her they ran out of time and left. I implore pt to put forth more of an effort to participate in her own care, but she remains argumentative.

## 2022-05-11 NOTE — Evaluation (Signed)
Clinical/Bedside Swallow Evaluation Patient Details  Name: Rose Bridges MRN: [id number] Date of Birth: [birth year]  Today's Date: 05/11/2022 Time: SLP Start Time (ACUTE ONLY): 91 SLP Stop Time (ACUTE ONLY): 1440 SLP Time Calculation (min) (ACUTE ONLY): 25 min  Past Medical History:  Past Medical History:  Diagnosis Date   Hyperlipemia    Hypertension    Osteogenesis imperfecta    Osteoporosis    Past Surgical History:  Past Surgical History:  Procedure Laterality Date   ABDOMINAL HYSTERECTOMY     HIP ARTHROPLASTY Left 09/26/2015   Procedure: ARTHROPLASTY BIPOLAR HIP (HEMIARTHROPLASTY);  Surgeon: Renette Butters, MD;  Location: [facility];  Service: Orthopedics;  Laterality: Left;   ORIF HUMERUS FRACTURE Right 11/06/2016   Procedure: OPEN REDUCTION INTERNAL FIXATION (ORIF) PROXIMAL HUMERUS FRACTURE;  Surgeon: Renette Butters, MD;  Location: [facility];  Service: Orthopedics;  Laterality: Right;   HPI:  62 y.o. female with medical history significant for polycythemia, osteogenesis imperfecta, osteoporosis, hypertension, hyperlipidemia, seizure disorder, history of falls, recent pelvic fracture after mechanical fall on 03/31/2022 in her Home resulting in pelvic fracture, recently admitted by family medicine residency teaching service on 04/03/22 and discharged on 04/07/22 (Not FM patient, per resident Dr. Nancy Fetter) sent from SNF due to recent behavioral change, paranoid and agitation with pressured speech.  On presentation, UA was suggestive of UTI.  Head CT was notable for a new age-indeterminate left cerebellar infarct compared to 2018, no intracranial hemorrhage;  sequela of severe chronic microvascular ischemic change with multiple superimposed basal ganglia infarcts.    Assessment / Plan / Recommendation  Clinical Impression  Pt demonstrates no signs of aspiration. She has missing upper dentition, teeth pulled in her 30s-40s. Still able to masticate most foods. Is currently on a mechnical soft  diet. Pt denies any dysphagia. Recommend pt resume a regular diet as she is able to make her own food choices. No SLP f/u needed will sign off. SLP Visit Diagnosis: Dysphagia, unspecified (R13.10)    Aspiration Risk  Mild aspiration risk    Diet Recommendation Regular;Thin liquid   Liquid Administration via: Cup;Straw Medication Administration: Whole meds with liquid Supervision: Patient able to self feed    Other  Recommendations      Recommendations for follow up therapy are one component of a multi-disciplinary discharge planning process, led by the attending physician.  Recommendations may be updated based on patient status, additional functional criteria and insurance authorization.  Follow up Recommendations        Assistance Recommended at Discharge    Functional Status Assessment    Frequency and Duration            Prognosis        Swallow Study   General HPI: 62 y.o. female with medical history significant for polycythemia, osteogenesis imperfecta, osteoporosis, hypertension, hyperlipidemia, seizure disorder, history of falls, recent pelvic fracture after mechanical fall on 03/31/2022 in her Home resulting in pelvic fracture, recently admitted by family medicine residency teaching service on 04/03/22 and discharged on 04/07/22 (Not FM patient, per resident Dr. Nancy Fetter) sent from SNF due to recent behavioral change, paranoid and agitation with pressured speech.  On presentation, UA was suggestive of UTI.  Head CT was notable for a new age-indeterminate left cerebellar infarct compared to 2018, no intracranial hemorrhage;  sequela of severe chronic microvascular ischemic change with multiple superimposed basal ganglia infarcts. Type of Study: Bedside Swallow Evaluation Previous Swallow Assessment: none Diet Prior to this Study: Dysphagia 3 (soft);Thin liquids Temperature Spikes Noted:  No Respiratory Status: Room air History of Recent Intubation: No Behavior/Cognition:  Alert;Cooperative;Pleasant mood Oral Cavity Assessment: Within Functional Limits Oral Care Completed by SLP: No Oral Cavity - Dentition: Missing dentition Vision: Functional for self-feeding Self-Feeding Abilities: Able to feed self Patient Positioning: Upright in bed Baseline Vocal Quality: Normal Volitional Cough: Strong Volitional Swallow: Able to elicit    Oral/Motor/Sensory Function Overall Oral Motor/Sensory Function: Within functional limits   Ice Chips     Thin Liquid Thin Liquid: Within functional limits Presentation: Straw    Nectar Thick Nectar Thick Liquid: Not tested   Honey Thick Honey Thick Liquid: Not tested   Puree Puree: Within functional limits   Solid     Solid: Within functional limits      Rose Bridges, Katherene Ponto 05/11/2022,2:36 PM

## 2022-05-11 NOTE — Progress Notes (Signed)
PROGRESS NOTE    Rose Bridges  YQM:[id number] DOB: 1960-06-18 DOA: 05/10/2022 PCP: Cain Saupe, MD   Brief Narrative:  62 y.o. female with medical history significant for polycythemia, osteogenesis imperfecta, osteoporosis, hypertension, hyperlipidemia, seizure disorder, history of falls, recent pelvic fracture after mechanical fall on 03/31/2022 in her Home resulting in pelvic fracture, recently admitted by family medicine residency teaching service on 04/03/22 and discharged on 04/07/22 (Not FM patient, per resident Dr. Wynelle Link) sent from SNF due to recent behavioral change, paranoid and agitation with pressured speech.  On presentation, UA was suggestive of UTI.  Head CT was notable for a new age-indeterminate left cerebellar infarct compared to 2018, no intracranial hemorrhage;  sequela of severe chronic microvascular ischemic change with multiple superimposed basal ganglia infarcts.  WBC 10.6, lactic acid 3.0.  She was started on IV fluids and antibiotics.  Assessment & Plan:   UTI: Present on admission -Continue Rocephin.  Follow urine culture.  Acute metabolic encephalopathy Agitation/delirium -Patient presented with behavioral change, catatonia and agitation possibly from UTI. -Mental status improving, still slow to respond but answers questions appropriately.  If mental status does not improve with antibiotic treatment, will consider MRI of brain -CT head as above. -Monitor mental status.  Fall precautions.  PT/OT/SLP evaluation.  Seizure disorder with noncompliance with AEDs -Continue Keppra.  Seizure precautions.  Outpatient follow-up with neurology  Osteogenesis imperfecta -Outpatient follow-up  Polycythemia -Unclear if primary versus secondary.  Hemoglobin much improved after hydration.  Hypomagnesemia -Replace.  Repeat a.m. labs  History of recent pelvic fracture in December 2023 -Managed conservatively -Complains of severe lower back pain.  Continue oxycodone as  needed.  Add Lidoderm patch.  Also start Tylenol around-the-clock  Physical deconditioning -Patient presenting from SNF.  Does not want to go back to the same SNF.  Will consult social worker.   DVT prophylaxis: Lovenox Code Status: Full Family Communication: None at bedside Disposition Plan: Status is: Inpatient Remains inpatient appropriate because: Of severity of illness.  Need for IV antibiotics    Consultants: None  Procedures: None  Antimicrobials: Rocephin from 05/10/2022 onwards   Subjective: Patient seen and examined at bedside.  Answers some questions, poor historian.  Complains of back pain.  No fever, vomiting, seizures reported.  Objective: Vitals:   05/10/22 2125 05/11/22 0007 05/11/22 0459 05/11/22 0741  BP: 111/67 105/66 (!) 98/49 106/67  Pulse: 100 75 70 75  Resp:    17  Temp: 98.5 F (36.9 C) 97.9 F (36.6 C) 97.8 F (36.6 C) 97.8 F (36.6 C)  TempSrc: Oral Oral Oral Oral  SpO2: 97% 97% 99% 98%  Weight:      Height:        Intake/Output Summary (Last 24 hours) at 05/11/2022 1035 Last data filed at 05/11/2022 0802 Gross per 24 hour  Intake 339.95 ml  Output 300 ml  Net 39.95 ml   Filed Weights   05/10/22 1324  Weight: 59 kg    Examination:  General exam: Appears calm and comfortable.  Looks chronically ill and deconditioned.  On room air. Respiratory system: Bilateral decreased breath sounds at bases Cardiovascular system: S1 & S2 heard, Rate controlled Gastrointestinal system: Abdomen is nondistended, soft and nontender. Normal bowel sounds heard. Extremities: No cyanosis, clubbing, edema  Central nervous system: Awake and alert, slow to respond, answers some questions appropriately.  No focal neurological deficits. Moving extremities Skin: No rashes, lesions or ulcers Psychiatry: Flat affect.  No signs of agitation.   Data Reviewed: I have personally  reviewed following labs and imaging studies  CBC: Recent Labs  Lab 05/10/22 1521  05/10/22 2150 05/11/22 0312  WBC 10.6* 9.8 7.6  NEUTROABS 7.2  --   --   HGB 17.2* 14.0 12.4  HCT 53.1* 42.6 37.4  MCV 95.7 95.7 95.9  PLT 253 226 831   Basic Metabolic Panel: Recent Labs  Lab 05/10/22 1521 05/10/22 2150 05/11/22 0312  NA 138  --  142  K 4.0  --  4.0  CL 102  --  110  CO2 22  --  25  GLUCOSE 161*  --  120*  BUN 23  --  17  CREATININE 0.85 0.80 0.61  CALCIUM 9.5  --  7.8*  MG 1.8  --  1.5*  PHOS 3.0  --   --    GFR: Estimated Creatinine Clearance: 63 mL/min (by C-G formula based on SCr of 0.61 mg/dL). Liver Function Tests: Recent Labs  Lab 05/10/22 1521  AST 31  ALT 17  ALKPHOS 135*  BILITOT 0.7  PROT 7.7  ALBUMIN 3.7   No results for input(s): "LIPASE", "AMYLASE" in the last 168 hours. Recent Labs  Lab 05/10/22 1521  AMMONIA 22   Coagulation Profile: No results for input(s): "INR", "PROTIME" in the last 168 hours. Cardiac Enzymes: No results for input(s): "CKTOTAL", "CKMB", "CKMBINDEX", "TROPONINI" in the last 168 hours. BNP (last 3 results) No results for input(s): "PROBNP" in the last 8760 hours. HbA1C: No results for input(s): "HGBA1C" in the last 72 hours. CBG: No results for input(s): "GLUCAP" in the last 168 hours. Lipid Profile: No results for input(s): "CHOL", "HDL", "LDLCALC", "TRIG", "CHOLHDL", "LDLDIRECT" in the last 72 hours. Thyroid Function Tests: Recent Labs    05/10/22 2150  TSH 1.152   Anemia Panel: No results for input(s): "VITAMINB12", "FOLATE", "FERRITIN", "TIBC", "IRON", "RETICCTPCT" in the last 72 hours. Sepsis Labs: Recent Labs  Lab 05/10/22 1521 05/10/22 1639  LATICACIDVEN 3.0* 1.6    No results found for this or any previous visit (from the past 240 hour(s)).       Radiology Studies: DG Chest Port 1 View  Result Date: 05/10/2022 CLINICAL DATA:  Altered mental status EXAM: PORTABLE CHEST 1 VIEW COMPARISON:  04/19/2018 FINDINGS: The lungs appear clear. Cardiac and mediastinal contours normal. No  pleural effusion identified. Unchanged appearance of the right proximal humeral fixator. There is a suggestion of deformity of the right distal clavicle, likely from a remote healed fracture. IMPRESSION: 1. No active cardiopulmonary disease is radiographically apparent. 2. Suspected remote healed fracture of the right distal clavicle. Electronically Signed   By: Van Clines M.D.   On: 05/10/2022 15:59   CT Head Wo Contrast  Result Date: 05/10/2022 CLINICAL DATA:  Altered mental status EXAM: CT HEAD WITHOUT CONTRAST TECHNIQUE: Contiguous axial images were obtained from the base of the skull through the vertex without intravenous contrast. RADIATION DOSE REDUCTION: This exam was performed according to the departmental dose-optimization program which includes automated exposure control, adjustment of the mA and/or kV according to patient size and/or use of iterative reconstruction technique. COMPARISON:  CT head 11/05/16, MRI head 11/08/16 FINDINGS: Brain: No evidence of acute infarction, hemorrhage, hydrocephalus, extra-axial collection or mass lesion/mass effect. There is sequela of severe chronic microvascular ischemic change with multiple superimposed basal ganglia infarcts. 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 Skull: Normal. Negative for fracture or focal lesion. Sinuses/Orbits: Likely bilateral maxillary sinus mucous retention cysts.  Paranasal sinuses are otherwise clear. Orbits are unremarkable. No mastoid or middle ear effusion. Other: None IMPRESSION: 1. 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 2. No intracranial hemorrhage. 3. Sequela of severe chronic microvascular ischemic change with multiple superimposed basal ganglia infarcts. Electronically Signed   By: Marin Roberts M.D.   On: 05/10/2022 15:14        Scheduled Meds:  calcitonin (salmon)  1 spray  Alternating Nares Daily   calcium carbonate  1 tablet Oral BID   enoxaparin (LOVENOX) injection  40 mg Subcutaneous D53G   folic acid  1 mg Oral Daily   levETIRAcetam  500 mg Oral BID   [START ON 05/12/2022] pneumococcal 20-valent conjugate vaccine  0.5 mL Intramuscular Tomorrow-1000   senna-docusate  1 tablet Oral QHS   thiamine  100 mg Oral Daily   Vitamin D (Ergocalciferol)  50,000 Units Oral Q7 days   Continuous Infusions:  sodium chloride 75 mL/hr at 05/11/22 0315   cefTRIAXone (ROCEPHIN)  IV            Aline August, MD [facility] 05/11/2022, 10:35 AM

## 2022-05-11 NOTE — Social Work (Addendum)
CSW noted pt arrived from [facility]. CSW spoke with facility who confirmed pt is short term rehab there. Pt is currently still under coverage, but will likely need to re qualify before returning. Pt is traditional Medicare and will not need an authorization, but does require a qualifying 3 night stay. Facility stated pt had been limited in PT recently and will wait to see if she becomes more active after she is more medically stable. Per MD note, pt reported she did not want to return to the same facility. Pt does not have recommendations yet, and unsure if this would be a possibility, will refer pt to additional facilities for options. TOC will follow for recommendations and a formal assessment when appropriate.   3:00 CSW noting SNF recommendation and met with pt at bedside to discuss. Pt became tearful when talking about going back to [facility]. CSW noted understanding and advised that the only other options were one star facilities, [facility] was better rated. Pt requested CSW follow up with Nira Conn and she will go wherever Nira Conn wants her to go. Nira Conn is listed as Relative. CSW left a VM. TOC will continue to follow.

## 2022-05-11 NOTE — Evaluation (Signed)
Physical Therapy Evaluation Patient Details Name: Rose Bridges MRN: [id number] DOB: 10-21-1960 Today's Date: 05/11/2022  History of Present Illness  Pt admitted 1/11 from [facility] with AMS, paranoia and agitation. Pt was at SNF for short term rehab following fall with pelvic fxs 03/31/22. PMH:  polycythemia, osteogenesis imperfecta, osteoporosis, hypertension, hyperlipidemia, seizure disorder   Clinical Impression  Pt admitted with above diagnosis. Prior to hospitalization in Dec 2023 following a fall, pt lived at home with her mother, independent mobility/ADLs. Pt has been at [facility] for rehab, where she has been working on ambulation and wheelchair mobility (per pt).  Pt currently with functional limitations due to the deficits listed below (see PT Problem List). On eval, pt required min assist bed mobility. Pt declining mobility beyond EOB. Pt will benefit from skilled PT to increase their independence and safety with mobility to allow discharge to the venue listed below.          Recommendations for follow up therapy are one component of a multi-disciplinary discharge planning process, led by the attending physician.  Recommendations may be updated based on patient status, additional functional criteria and insurance authorization.  Follow Up Recommendations Skilled nursing-short term rehab (<3 hours/day) Can patient physically be transported by private vehicle: Yes    Assistance Recommended at Discharge Intermittent Supervision/Assistance  Patient can return home with the following  A little help with walking and/or transfers;A little help with bathing/dressing/bathroom;Help with stairs or ramp for entrance;Assistance with cooking/housework;Assist for transportation    Equipment Recommendations Other (comment) (TBD)  Recommendations for Other Services       Functional Status Assessment Patient has had a recent decline in their functional status and demonstrates the  ability to make significant improvements in function in a reasonable and predictable amount of time.     Precautions / Restrictions Precautions Precautions: Fall      Mobility  Bed Mobility Overal bed mobility: Needs Assistance Bed Mobility: Supine to Sit, Sit to Supine     Supine to sit: Min assist, HOB elevated Sit to supine: Min assist, HOB elevated   General bed mobility comments: +rail, assist to scoot to EOB    Transfers                   General transfer comment: Pt declining OOB due to wanting to eat lunch. States she is willing to sit in a wheelchair but not the bedside recliner.    Ambulation/Gait               General Gait Details: Declining OOB on eval. Reports she has been walking with therapy at [facility].  Stairs            Wheelchair Mobility    Modified Rankin (Stroke Patients Only)       Balance Overall balance assessment: Needs assistance Sitting-balance support: Feet supported, No upper extremity supported Sitting balance-Leahy Scale: Good                                       Pertinent Vitals/Pain Pain Assessment Pain Assessment: Faces Faces Pain Scale: Hurts a little bit Pain Location: pelvis Pain Descriptors / Indicators: Discomfort Pain Intervention(s): Limited activity within patient's tolerance, Monitored during session, Repositioned    Home Living Family/patient expects to be discharged to:: Private residence Living Arrangements: Parent Available Help at Discharge: Other (Comment) (Limited assist. Pt lives with her mother who  has dementia and has a caregiver herself. But caregiver cannot assist pt (other than household IADLs)) Type of Home: House Home Access: Stairs to enter Entrance Stairs-Rails: None Entrance Stairs-Number of Steps: 3-4   Home Layout: Two level;Able to live on main level with bedroom/bathroom Home Equipment: Rollator (4 wheels);BSC/3in1 Additional Comments: PLOF is prior  to 12/2 fall/pelvic fxs    Prior Function Prior Level of Function : Independent/Modified Independent             Mobility Comments: Reports normally ambulatory short community distances without AD ADLs Comments: Does not drive; has groceries delivered; independent with ADLs but very limited IADLs     Hand Dominance   Dominant Hand: Right    Extremity/Trunk Assessment   Upper Extremity Assessment Upper Extremity Assessment: Overall WFL for tasks assessed    Lower Extremity Assessment Lower Extremity Assessment: Generalized weakness       Communication   Communication: No difficulties  Cognition Arousal/Alertness: Awake/alert Behavior During Therapy: Anxious Overall Cognitive Status: No family/caregiver present to determine baseline cognitive functioning                                 General Comments: Very verbose and tangential. Difficult to redirect at times.        General Comments      Exercises     Assessment/Plan    PT Assessment Patient needs continued PT services  PT Problem List Decreased strength;Pain;Decreased mobility;Decreased activity tolerance       PT Treatment Interventions Functional mobility training;Balance training;Patient/family education;Gait training;Therapeutic activities;Therapeutic exercise    PT Goals (Current goals can be found in the Care Plan section)  Acute Rehab PT Goals Patient Stated Goal: not stated PT Goal Formulation: With patient Time For Goal Achievement: 05/25/22 Potential to Achieve Goals: Good    Frequency Min 3X/week     Co-evaluation               AM-PAC PT "6 Clicks" Mobility  Outcome Measure Help needed turning from your back to your side while in a flat bed without using bedrails?: A Little Help needed moving from lying on your back to sitting on the side of a flat bed without using bedrails?: A Little Help needed moving to and from a bed to a chair (including a wheelchair)?: A  Lot Help needed standing up from a chair using your arms (e.g., wheelchair or bedside chair)?: A Lot Help needed to walk in hospital room?: A Lot Help needed climbing 3-5 steps with a railing? : Total 6 Click Score: 13    End of Session   Activity Tolerance: Patient tolerated treatment well Patient left: in bed;with call bell/phone within reach;with bed alarm set Nurse Communication: Mobility status PT Visit Diagnosis: Difficulty in walking, not elsewhere classified (R26.2);Muscle weakness (generalized) (M62.81)    Time: 8563-1497 PT Time Calculation (min) (ACUTE ONLY): 14 min   Charges:   PT Evaluation $PT Eval Moderate Complexity: 1 Mod          Lorrin Goodell, PT  Office # [phone] Pager [phone]   Lorriane Shire 05/11/2022, 12:38 PM

## 2022-05-12 ENCOUNTER — Inpatient Hospital Stay (HOSPITAL_COMMUNITY): Payer: Medicare Other

## 2022-05-12 DIAGNOSIS — R4182 Altered mental status, unspecified: Secondary | ICD-10-CM | POA: Diagnosis not present

## 2022-05-12 LAB — VITAMIN B12: Vitamin B-12: 437 pg/mL (ref 180–914)

## 2022-05-12 LAB — BASIC METABOLIC PANEL
Anion gap: 8 (ref 5–15)
BUN: 13 mg/dL (ref 8–23)
CO2: 24 mmol/L (ref 22–32)
Calcium: 8.4 mg/dL — ABNORMAL LOW (ref 8.9–10.3)
Chloride: 107 mmol/L (ref 98–111)
Creatinine, Ser: 0.57 mg/dL (ref 0.44–1.00)
GFR, Estimated: >60 mL/min (ref >60)
Glucose, Bld: 96 mg/dL (ref 70–99)
Potassium: 4.4 mmol/L (ref 3.5–5.1)
Sodium: 139 mmol/L (ref 135–145)

## 2022-05-12 LAB — MAGNESIUM: Magnesium: 1.9 mg/dL (ref 1.7–2.4)

## 2022-05-12 MED ORDER — ONDANSETRON HCL 4 MG/2ML IJ SOLN
4.0000 mg | Freq: Four times a day (QID) | INTRAMUSCULAR | Status: DC
  Administered 2022-05-12 – 2022-05-17 (×19): 4 mg via INTRAVENOUS
  Filled 2022-05-12 (×19): qty 2

## 2022-05-12 MED ORDER — DICYCLOMINE HCL 10 MG PO CAPS: 10.0000 mg | ORAL_CAPSULE | Freq: Four times a day (QID) | ORAL | Status: DC | PRN

## 2022-05-12 MED ORDER — MORPHINE SULFATE (PF) 2 MG/ML IV SOLN
2.0000 mg | INTRAVENOUS | Status: DC | PRN
  Administered 2022-05-12 – 2022-05-13 (×5): 2 mg via INTRAVENOUS
  Filled 2022-05-12 (×5): qty 1

## 2022-05-12 MED ORDER — ONDANSETRON HCL 4 MG/2ML IJ SOLN
4.0000 mg | Freq: Four times a day (QID) | INTRAMUSCULAR | Status: DC | PRN
  Administered 2022-05-12: 4 mg via INTRAVENOUS
  Filled 2022-05-12: qty 2

## 2022-05-12 NOTE — Progress Notes (Signed)
PROGRESS NOTE    Rose Bridges  XFG:[id number] DOB: [birth year] DOA: 05/10/2022 PCP: Cain Saupe, MD   Brief Narrative:  62 y.o. female with medical history significant for polycythemia, osteogenesis imperfecta, osteoporosis, hypertension, hyperlipidemia, seizure disorder, history of falls, recent pelvic fracture after mechanical fall on 03/31/2022 in her Home resulting in pelvic fracture, recently admitted by family medicine residency teaching service on 04/03/22 and discharged on 04/07/22 (Not FM patient, per resident Dr. Wynelle Link) sent from SNF due to recent behavioral change, paranoid and agitation with pressured speech.  On presentation, UA was suggestive of UTI.  Head CT was notable for a new age-indeterminate left cerebellar infarct compared to 2018, no intracranial hemorrhage;  sequela of severe chronic microvascular ischemic change with multiple superimposed basal ganglia infarcts.  WBC 10.6, lactic acid 3.0.  She was started on IV fluids and antibiotics.  Assessment & Plan:   UTI: Present on admission -Continue Rocephin.  Urine culture grew more than 100,000 colonies per mL of gram-negative rods: Follow identification and sensitivities  Acute metabolic encephalopathy Agitation/delirium -Patient presented with behavioral change, catatonia and agitation possibly from UTI. -Mental status improving -CT head as above. -Monitor mental status.  Fall precautions.  PT recommends SNF placement.  Social worker consulted -Diet as per SLP recommendations  Seizure disorder with noncompliance with AEDs -Continue Keppra.  Seizure precautions.  Outpatient follow-up with neurology  Osteogenesis imperfecta -Outpatient follow-up  Polycythemia -Unclear if primary versus secondary.  Hemoglobin much improved after hydration.  Hypomagnesemia -Improved  History of recent pelvic fracture in December 2023 -Managed conservatively -Complains of severe lower back pain.  Continue oxycodone as needed.   Continue Lidoderm patch and round-the-clock Tylenol  Physical deconditioning -Patient presenting from SNF.  Does not want to go back to the same SNF.  TOC following  DVT prophylaxis: Lovenox Code Status: Full Family Communication: Granddaughter at bedside Disposition Plan: Status is: Inpatient Remains inpatient appropriate because: Of severity of illness.  Need for IV antibiotics    Consultants: None  Procedures: None  Antimicrobials: Rocephin from 05/10/2022 onwards   Subjective: Patient seen and examined at bedside.  No agitation, fever or vomiting reported.  Still complains of intermittent back pain.  Feels slightly better.  Objective: Vitals:   05/11/22 0459 05/11/22 0741 05/11/22 1530 05/11/22 2003  BP: (!) 98/49 106/67 119/80 (!) 117/58  Pulse: 70 75 73 84  Resp:  17 18 18   Temp: 97.8 F (36.6 C) 97.8 F (36.6 C) 98 F (36.7 C) 97.8 F (36.6 C)  TempSrc: Oral Oral Oral Oral  SpO2: 99% 98% 100% 99%  Weight:      Height:        Intake/Output Summary (Last 24 hours) at 05/12/2022 0815 Last data filed at 05/11/2022 2357 Gross per 24 hour  Intake 1091.33 ml  Output 1000 ml  Net 91.33 ml    Filed Weights   05/10/22 1324  Weight: 59 kg    Examination:  General: On room air.  No distress.  Looks chronically ill and deconditioned. ENT/neck: No thyromegaly.  JVD is not elevated  respiratory: Decreased breath sounds at bases bilaterally with some crackles; no wheezing CVS: S1-S2 heard, rate controlled currently Abdominal: Soft, nontender, slightly distended; no organomegaly, bowel sounds are heard Extremities: Trace lower extremity edema; no cyanosis  CNS: Awake, still slow to respond but answers some questions appropriately.  No focal neurologic deficit.  Moves extremities Lymph: No obvious lymphadenopathy Skin: No obvious ecchymosis/lesions  psych: Not agitated.  Intermittently smiling but mostly flat  affect.   Musculoskeletal: No obvious joint  swelling/deformity    Data Reviewed: I have personally reviewed following labs and imaging studies  CBC: Recent Labs  Lab 05/10/22 1521 05/10/22 2150 05/11/22 0312  WBC 10.6* 9.8 7.6  NEUTROABS 7.2  --   --   HGB 17.2* 14.0 12.4  HCT 53.1* 42.6 37.4  MCV 95.7 95.7 95.9  PLT 253 226 878    Basic Metabolic Panel: Recent Labs  Lab 05/10/22 1521 05/10/22 2150 05/11/22 0312 05/12/22 0249  NA 138  --  142 139  K 4.0  --  4.0 4.4  CL 102  --  110 107  CO2 22  --  25 24  GLUCOSE 161*  --  120* 96  BUN 23  --  17 13  CREATININE 0.85 0.80 0.61 0.57  CALCIUM 9.5  --  7.8* 8.4*  MG 1.8  --  1.5* 1.9  PHOS 3.0  --   --   --     GFR: Estimated Creatinine Clearance: 63 mL/min (by C-G formula based on SCr of 0.57 mg/dL). Liver Function Tests: Recent Labs  Lab 05/10/22 1521  AST 31  ALT 17  ALKPHOS 135*  BILITOT 0.7  PROT 7.7  ALBUMIN 3.7    No results for input(s): "LIPASE", "AMYLASE" in the last 168 hours. Recent Labs  Lab 05/10/22 1521  AMMONIA 22    Coagulation Profile: No results for input(s): "INR", "PROTIME" in the last 168 hours. Cardiac Enzymes: No results for input(s): "CKTOTAL", "CKMB", "CKMBINDEX", "TROPONINI" in the last 168 hours. BNP (last 3 results) No results for input(s): "PROBNP" in the last 8760 hours. HbA1C: No results for input(s): "HGBA1C" in the last 72 hours. CBG: No results for input(s): "GLUCAP" in the last 168 hours. Lipid Profile: No results for input(s): "CHOL", "HDL", "LDLCALC", "TRIG", "CHOLHDL", "LDLDIRECT" in the last 72 hours. Thyroid Function Tests: Recent Labs    05/10/22 2150  TSH 1.152    Anemia Panel: Recent Labs    05/12/22 0249  VITAMINB12 437   Sepsis Labs: Recent Labs  Lab 05/10/22 1521 05/10/22 1639  LATICACIDVEN 3.0* 1.6     Recent Results (from the past 240 hour(s))  Urine Culture     Status: Abnormal (Preliminary result)   Collection Time: 05/10/22  4:39 PM   Specimen: Urine, Clean Catch   Result Value Ref Range Status   Specimen Description URINE, CLEAN CATCH  Final   Special Requests NONE  Final   Culture (A)  Final    >=100,000 COLONIES/mL GRAM NEGATIVE RODS SUBCULTURED FOR ISOLATION Performed at [facility], [place] [address] [place], [place] [address]    Report Status PENDING  Incomplete         Radiology Studies: DG Chest Port 1 View  Result Date: 05/10/2022 CLINICAL DATA:  Altered mental status EXAM: PORTABLE CHEST 1 VIEW COMPARISON:  04/19/2018 FINDINGS: The lungs appear clear. Cardiac and mediastinal contours normal. No pleural effusion identified. Unchanged appearance of the right proximal humeral fixator. There is a suggestion of deformity of the right distal clavicle, likely from a remote healed fracture. IMPRESSION: 1. No active cardiopulmonary disease is radiographically apparent. 2. Suspected remote healed fracture of the right distal clavicle. Electronically Signed   By: Van Clines M.D.   On: 05/10/2022 15:59   CT Head Wo Contrast  Result Date: 05/10/2022 CLINICAL DATA:  Altered mental status EXAM: CT HEAD WITHOUT CONTRAST TECHNIQUE: Contiguous axial images were obtained from the base of the skull  through the vertex without intravenous contrast. RADIATION DOSE REDUCTION: This exam was performed according to the departmental dose-optimization program which includes automated exposure control, adjustment of the mA and/or kV according to patient size and/or use of iterative reconstruction technique. COMPARISON:  CT head 11/05/16, MRI head 11/08/16 FINDINGS: Brain: No evidence of acute infarction, hemorrhage, hydrocephalus, extra-axial collection or mass lesion/mass effect. There is sequela of severe chronic microvascular ischemic change with multiple superimposed basal ganglia infarcts. 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 Skull: Normal. Negative for fracture  or focal lesion. Sinuses/Orbits: Likely bilateral maxillary sinus mucous retention cysts. Paranasal sinuses are otherwise clear. Orbits are unremarkable. No mastoid or middle ear effusion. Other: None IMPRESSION: 1. 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 2. No intracranial hemorrhage. 3. Sequela of severe chronic microvascular ischemic change with multiple superimposed basal ganglia infarcts. Electronically Signed   By: Marin Roberts M.D.   On: 05/10/2022 15:14        Scheduled Meds:  acetaminophen  1,000 mg Oral Q8H   calcitonin (salmon)  1 spray Alternating Nares Daily   calcium carbonate  1 tablet Oral BID   enoxaparin (LOVENOX) injection  40 mg Subcutaneous E95M   folic acid  1 mg Oral Daily   levETIRAcetam  500 mg Oral BID   lidocaine  1 patch Transdermal Daily   pneumococcal 20-valent conjugate vaccine  0.5 mL Intramuscular Tomorrow-1000   senna-docusate  1 tablet Oral BID   thiamine  100 mg Oral Daily   Vitamin D (Ergocalciferol)  50,000 Units Oral Q7 days   Continuous Infusions:  sodium chloride Stopped (05/11/22 1348)   cefTRIAXone (ROCEPHIN)  IV Stopped (05/11/22 1111)          Aline August, MD [facility] 05/12/2022, 8:15 AM

## 2022-05-12 NOTE — Evaluation (Signed)
Occupational Therapy Evaluation Patient Details Name: Rose Bridges MRN: [id number] DOB: May 18, 1960 Today's Date: 05/12/2022   History of Present Illness Pt admitted 1/11 from [facility] with AMS, paranoia and agitation. Pt was at SNF for short term rehab following fall with pelvic fxs 03/31/22. PMH:  polycythemia, osteogenesis imperfecta, osteoporosis, hypertension, hyperlipidemia, seizure disorder   Clinical Impression   Patient admitted for the diagnosis above.  PTA she was undergoing short term rehab after a recent fall with pelvic fractures.  Patient reports continued support for lower body ADL from staff, and walking short distances with rehab.  OT will continue to follow in the acute setting to address deficits listed below impacting independence, and SNF level rehab is recommended to maximize her functional status for an eventual return home.        Recommendations for follow up therapy are one component of a multi-disciplinary discharge planning process, led by the attending physician.  Recommendations may be updated based on patient status, additional functional criteria and insurance authorization.   Follow Up Recommendations  Skilled nursing-short term rehab (<3 hours/day)     Assistance Recommended at Discharge Frequent or constant Supervision/Assistance  Patient can return home with the following Assist for transportation;Help with stairs or ramp for entrance;A little help with bathing/dressing/bathroom;A little help with walking and/or transfers    Functional Status Assessment  Patient has had a recent decline in their functional status and demonstrates the ability to make significant improvements in function in a reasonable and predictable amount of time.  Equipment Recommendations  None recommended by OT    Recommendations for Other Services       Precautions / Restrictions Precautions Precautions: Fall Restrictions Weight Bearing Restrictions: No       Mobility Bed Mobility   Bed Mobility: Sit to Supine       Sit to supine: Supervision        Transfers Overall transfer level: Needs assistance Equipment used: Rolling walker (2 wheels) Transfers: Sit to/from Stand, Bed to chair/wheelchair/BSC Sit to Stand: Min guard     Step pivot transfers: Min guard            Balance Overall balance assessment: Needs assistance, History of Falls Sitting-balance support: Feet supported, No upper extremity supported Sitting balance-Leahy Scale: Fair     Standing balance support: Reliant on assistive device for balance Standing balance-Leahy Scale: Poor                             ADL either performed or assessed with clinical judgement   ADL Overall ADL's : Needs assistance/impaired Eating/Feeding: Set up;Sitting   Grooming: Wash/dry hands;Wash/dry face;Set up;Sitting   Upper Body Bathing: Supervision/ safety;Sitting   Lower Body Bathing: Minimal assistance;Moderate assistance;Sit to/from stand   Upper Body Dressing : Set up;Sitting   Lower Body Dressing: Minimal assistance;Moderate assistance;Sit to/from stand   Toilet Transfer: Min guard;Stand-pivot;BSC/3in1                   Vision Baseline Vision/History: 1 Wears glasses Patient Visual Report: No change from baseline       Perception     Praxis      Pertinent Vitals/Pain Pain Assessment Pain Assessment: Faces Faces Pain Scale: Hurts a little bit Pain Location: stomach Pain Descriptors / Indicators: Sore, Tightness Pain Intervention(s): Monitored during session     Hand Dominance Right   Extremity/Trunk Assessment Upper Extremity Assessment Upper Extremity Assessment: Overall WFL for tasks assessed  Lower Extremity Assessment Lower Extremity Assessment: Defer to PT evaluation   Cervical / Trunk Assessment Cervical / Trunk Assessment: Kyphotic   Communication Communication Communication: No difficulties   Cognition  Arousal/Alertness: Awake/alert Behavior During Therapy: WFL for tasks assessed/performed Overall Cognitive Status: Within Functional Limits for tasks assessed                                 General Comments: Remians unclear about events leading up to her hospital admit     General Comments       Exercises     Shoulder Instructions      Home Living Family/patient expects to be discharged to:: Private residence Living Arrangements: Parent Available Help at Discharge: Other (Comment) Type of Home: House Home Access: Stairs to enter Entrance Stairs-Number of Steps: 3-4 Entrance Stairs-Rails: None Home Layout: Two level;Able to live on main level with bedroom/bathroom Alternate Level Stairs-Number of Steps: Reports she stays on couch downstairs to sleep but tends to use restroom upstairs, there is a bathroom available on main level if needed   Bathroom Shower/Tub: Occupational psychologist: Handicapped height Bathroom Accessibility: Yes   Home Equipment: Rollator (4 wheels);BSC/3in1   Additional Comments: Patient expects to return to SNF for continued rehab      Prior Functioning/Environment Prior Level of Function : Independent/Modified Independent             Mobility Comments: Reports normally ambulatory short community distances without AD ADLs Comments: Does not drive; has groceries delivered; independent with ADLs but very limited IADLs        OT Problem List: Decreased activity tolerance;Impaired balance (sitting and/or standing);Pain      OT Treatment/Interventions: Self-care/ADL training;Therapeutic activities;Balance training;DME and/or AE instruction    OT Goals(Current goals can be found in the care plan section) Acute Rehab OT Goals Patient Stated Goal: Return home evertually OT Goal Formulation: With patient Time For Goal Achievement: 05/25/22 Potential to Achieve Goals: Good ADL Goals Pt Will Perform Grooming: with modified  independence;standing Pt Will Perform Lower Body Dressing: with modified independence;sit to/from stand;with adaptive equipment Pt Will Transfer to Toilet: with modified independence;ambulating;regular height toilet  OT Frequency: Min 2X/week    Co-evaluation              AM-PAC OT "6 Clicks" Daily Activity     Outcome Measure Help from another person eating meals?: None Help from another person taking care of personal grooming?: None Help from another person toileting, which includes using toliet, bedpan, or urinal?: A Little Help from another person bathing (including washing, rinsing, drying)?: A Lot Help from another person to put on and taking off regular upper body clothing?: None Help from another person to put on and taking off regular lower body clothing?: A Lot 6 Click Score: 19   End of Session Equipment Utilized During Treatment: Gait belt;Rolling walker (2 wheels) Nurse Communication: Mobility status  Activity Tolerance: Patient tolerated treatment well Patient left: in bed;with call bell/phone within reach;with bed alarm set  OT Visit Diagnosis: Unsteadiness on feet (R26.81)                Time: 9937-1696 OT Time Calculation (min): 22 min Charges:  OT General Charges $OT Visit: 1 Visit OT Evaluation $OT Eval Moderate Complexity: 1 Mod  05/12/2022  RP, OTR/L  Acute Rehabilitation Services  Office:  [phone]   Metta Clines 05/12/2022, 12:51 PM

## 2022-05-13 ENCOUNTER — Inpatient Hospital Stay (HOSPITAL_COMMUNITY): Payer: Medicare Other

## 2022-05-13 DIAGNOSIS — R4182 Altered mental status, unspecified: Secondary | ICD-10-CM | POA: Diagnosis not present

## 2022-05-13 LAB — BASIC METABOLIC PANEL
Anion gap: 9 (ref 5–15)
BUN: 7 mg/dL — ABNORMAL LOW (ref 8–23)
CO2: 25 mmol/L (ref 22–32)
Calcium: 8.8 mg/dL — ABNORMAL LOW (ref 8.9–10.3)
Chloride: 102 mmol/L (ref 98–111)
Creatinine, Ser: 0.57 mg/dL (ref 0.44–1.00)
GFR, Estimated: >60 mL/min (ref >60)
Glucose, Bld: 82 mg/dL (ref 70–99)
Potassium: 4 mmol/L (ref 3.5–5.1)
Sodium: 136 mmol/L (ref 135–145)

## 2022-05-13 LAB — HEPATIC FUNCTION PANEL
ALT: 56 U/L — ABNORMAL HIGH (ref 0–44)
AST: 84 U/L — ABNORMAL HIGH (ref 15–41)
Albumin: 2.7 g/dL — ABNORMAL LOW (ref 3.5–5.0)
Alkaline Phosphatase: 160 U/L — ABNORMAL HIGH (ref 38–126)
Bilirubin, Direct: 0.4 mg/dL — ABNORMAL HIGH (ref 0.0–0.2)
Indirect Bilirubin: 0.3 mg/dL (ref 0.3–0.9)
Total Bilirubin: 0.7 mg/dL (ref 0.3–1.2)
Total Protein: 5.8 g/dL — ABNORMAL LOW (ref 6.5–8.1)

## 2022-05-13 LAB — URINE CULTURE: Culture: >=100000 — AB

## 2022-05-13 LAB — MAGNESIUM: Magnesium: 1.5 mg/dL — ABNORMAL LOW (ref 1.7–2.4)

## 2022-05-13 LAB — LIPASE, BLOOD: Lipase: 2337 U/L — ABNORMAL HIGH (ref 11–51)

## 2022-05-13 MED ORDER — HYDROMORPHONE HCL 1 MG/ML IJ SOLN
0.5000 mg | INTRAMUSCULAR | Status: DC | PRN
  Administered 2022-05-13 – 2022-05-16 (×12): 0.5 mg via INTRAVENOUS
  Filled 2022-05-13 (×12): qty 0.5

## 2022-05-13 MED ORDER — MAGNESIUM SULFATE 2 GM/50ML IV SOLN
2.0000 g | Freq: Once | INTRAVENOUS | Status: AC
  Administered 2022-05-13: 2 g via INTRAVENOUS
  Filled 2022-05-13: qty 50

## 2022-05-13 MED ORDER — IOHEXOL 350 MG/ML SOLN
75.0000 mL | Freq: Once | INTRAVENOUS | Status: AC | PRN
  Administered 2022-05-13: 75 mL via INTRAVENOUS

## 2022-05-13 MED ORDER — SODIUM CHLORIDE 0.9 % IV SOLN: INTRAVENOUS | Status: DC

## 2022-05-13 MED ORDER — IOHEXOL 9 MG/ML PO SOLN: 500.0000 mL | ORAL | Status: AC

## 2022-05-13 NOTE — Progress Notes (Signed)
Second bottle of Omnipaque given

## 2022-05-13 NOTE — Progress Notes (Signed)
First bottle of Omnipaque given.

## 2022-05-13 NOTE — Progress Notes (Signed)
PROGRESS NOTE    Rose Bridges  ZOX:[id number] DOB: 05-Dec-1960 DOA: 05/10/2022 PCP: Antony Blackbird, MD   Brief Narrative:  62 y.o. female with medical history significant for polycythemia, osteogenesis imperfecta, osteoporosis, hypertension, hyperlipidemia, seizure disorder, history of falls, recent pelvic fracture after mechanical fall on 03/31/2022 in her Home resulting in pelvic fracture, recently admitted by family medicine residency teaching service on 04/03/22 and discharged on 04/07/22 (Not FM patient, per resident Dr. Nancy Fetter) sent from SNF due to recent behavioral change, paranoid and agitation with pressured speech.  On presentation, UA was suggestive of UTI.  Head CT was notable for a new age-indeterminate left cerebellar infarct compared to 2018, no intracranial hemorrhage;  sequela of severe chronic microvascular ischemic change with multiple superimposed basal ganglia infarcts.  WBC 10.6, lactic acid 3.0.  She was started on IV fluids and antibiotics.  Assessment & Plan:   UTI: Present on admission -Continue Rocephin.  Urine culture grew more than 100,000 colonies per mL of E. coli and Proteus mirabilis: Follow sensitivities  Acute metabolic encephalopathy Agitation/delirium -Patient presented with behavioral change, catatonia and agitation possibly from UTI. -Mental status improving -CT head as above. -Monitor mental status.  Fall precautions.  PT recommends SNF placement.  Social worker following  Possible acute pancreatitis -Started having vomiting and severe abdominal pain since 05/12/2022.  Lipase 2337.  Start aggressive hydration.  N.p.o. for now. -Check CT of the abdomen.  Seizure disorder with noncompliance with AEDs -Continue Keppra.  Seizure precautions.  Outpatient follow-up with neurology  Osteogenesis imperfecta -Outpatient follow-up  Polycythemia -Unclear if primary versus secondary.  Hemoglobin much improved after hydration.  Hypomagnesemia -Replace.  Repeat  a.m. labs  History of recent pelvic fracture in December 2023 -Managed conservatively -Complains of severe lower back pain.  Continue oxycodone as needed.  Continue Lidoderm patch and round-the-clock Tylenol  Physical deconditioning -Patient presenting from SNF.  Does not want to go back to the same SNF.  TOC following  DVT prophylaxis: Lovenox Code Status: Full Family Communication: Granddaughter at bedside on 05/12/2022 Disposition Plan: Status is: Inpatient Remains inpatient appropriate because: Of severity of illness.  Need for IV antibiotics, IV fluids    Consultants: None  Procedures: None  Antimicrobials: Rocephin from 05/10/2022 onwards   Subjective: Patient seen and examined at bedside.  Complains of intermittent severe abdominal pain with vomiting.  No fever or agitation reported. Objective: Vitals:   05/12/22 1641 05/12/22 2249 05/13/22 0650 05/13/22 0801  BP: (!) 151/80 128/69 104/61 (!) 92/48  Pulse: 80 85 93 80  Resp: 16 16 17 15   Temp: 98.5 F (36.9 C) 98.6 F (37 C) 99.4 F (37.4 C) 98.7 F (37.1 C)  TempSrc: Oral Oral Oral Oral  SpO2: 97% 96% 93% 94%  Weight:      Height:        Intake/Output Summary (Last 24 hours) at 05/13/2022 0826 Last data filed at 05/13/2022 0654 Gross per 24 hour  Intake 100 ml  Output 1150 ml  Net -1050 ml    Filed Weights   05/10/22 1324  Weight: 59 kg    Examination:  General: No acute distress.  Currently on room air.  Looks chronically ill and deconditioned. ENT/neck: No obvious neck masses palpable or JVD elevation noted  respiratory: Bilateral decreased breath sounds at bases with scattered crackles  CVS: Mostly rate controlled; S1 and S2 heard  abdominal: Soft, mildly tender in the epigastric region, distended mildly; no organomegaly, bowel sounds heard normally Extremities: No cyanosis or edema  CNS: Alert and awake, still slow to respond but answers questions appropriately.  No focal neurologic deficit.   Able to move extremities  lymph: No palpable lymphadenopathy noted Skin: No obvious petechiae/rashes psych: Mostly flat affect.  Currently not agitated Musculoskeletal: No obvious joint swelling/tenderness   Data Reviewed: I have personally reviewed following labs and imaging studies  CBC: Recent Labs  Lab 05/10/22 1521 05/10/22 2150 05/11/22 0312  WBC 10.6* 9.8 7.6  NEUTROABS 7.2  --   --   HGB 17.2* 14.0 12.4  HCT 53.1* 42.6 37.4  MCV 95.7 95.7 95.9  PLT 253 226 409    Basic Metabolic Panel: Recent Labs  Lab 05/10/22 1521 05/10/22 2150 05/11/22 0312 05/12/22 0249 05/13/22 0227  NA 138  --  142 139 136  K 4.0  --  4.0 4.4 4.0  CL 102  --  110 107 102  CO2 22  --  25 24 25   GLUCOSE 161*  --  120* 96 82  BUN 23  --  17 13 7*  CREATININE 0.85 0.80 0.61 0.57 0.57  CALCIUM 9.5  --  7.8* 8.4* 8.8*  MG 1.8  --  1.5* 1.9 1.5*  PHOS 3.0  --   --   --   --     GFR: Estimated Creatinine Clearance: 63 mL/min (by C-G formula based on SCr of 0.57 mg/dL). Liver Function Tests: Recent Labs  Lab 05/10/22 1521  AST 31  ALT 17  ALKPHOS 135*  BILITOT 0.7  PROT 7.7  ALBUMIN 3.7    Recent Labs  Lab 05/13/22 0227  LIPASE 2,337*   Recent Labs  Lab 05/10/22 1521  AMMONIA 22    Coagulation Profile: No results for input(s): "INR", "PROTIME" in the last 168 hours. Cardiac Enzymes: No results for input(s): "CKTOTAL", "CKMB", "CKMBINDEX", "TROPONINI" in the last 168 hours. BNP (last 3 results) No results for input(s): "PROBNP" in the last 8760 hours. HbA1C: No results for input(s): "HGBA1C" in the last 72 hours. CBG: No results for input(s): "GLUCAP" in the last 168 hours. Lipid Profile: No results for input(s): "CHOL", "HDL", "LDLCALC", "TRIG", "CHOLHDL", "LDLDIRECT" in the last 72 hours. Thyroid Function Tests: Recent Labs    05/10/22 2150  TSH 1.152    Anemia Panel: Recent Labs    05/12/22 0249  VITAMINB12 437    Sepsis Labs: Recent Labs  Lab  05/10/22 1521 05/10/22 1639  LATICACIDVEN 3.0* 1.6     Recent Results (from the past 240 hour(s))  Urine Culture     Status: Abnormal (Preliminary result)   Collection Time: 05/10/22  4:39 PM   Specimen: Urine, Clean Catch  Result Value Ref Range Status   Specimen Description URINE, CLEAN CATCH  Final   Special Requests NONE  Final   Culture (A)  Final    >=100,000 COLONIES/mL ESCHERICHIA COLI >=100,000 COLONIES/mL PROTEUS MIRABILIS SUSCEPTIBILITIES TO FOLLOW Performed at [facility], [place] [address], [place], [place] [address]    Report Status PENDING  Incomplete         Radiology Studies: DG Abd 1 View  Result Date: 05/12/2022 CLINICAL DATA:  62 year old female with vomiting EXAM: ABDOMEN - 1 VIEW COMPARISON:  None Available. FINDINGS: Nonspecific bowel gas pattern. Gas is noted throughout loops of small bowel without significant dilation. No air-fluid levels on this supine radiograph. No large free air. Left hip arthroplasty. IMPRESSION: Nonspecific bowel gas pattern with scattered air throughout small and large bowel. No obvious obstruction or free air. Electronically  Signed   By: Malachy Moan M.D.   On: 05/12/2022 15:43        Scheduled Meds:  acetaminophen  1,000 mg Oral Q8H   calcitonin (salmon)  1 spray Alternating Nares Daily   calcium carbonate  1 tablet Oral BID   enoxaparin (LOVENOX) injection  40 mg Subcutaneous Q24H   folic acid  1 mg Oral Daily   levETIRAcetam  500 mg Oral BID   lidocaine  1 patch Transdermal Daily   ondansetron (ZOFRAN) IV  4 mg Intravenous Q6H   senna-docusate  1 tablet Oral BID   thiamine  100 mg Oral Daily   Vitamin D (Ergocalciferol)  50,000 Units Oral Q7 days   Continuous Infusions:  cefTRIAXone (ROCEPHIN)  IV Stopped (05/12/22 1018)          Glade Lloyd, MD [facility] 05/13/2022, 8:26 AM

## 2022-05-14 ENCOUNTER — Inpatient Hospital Stay (HOSPITAL_COMMUNITY): Payer: Medicare Other

## 2022-05-14 DIAGNOSIS — R932 Abnormal findings on diagnostic imaging of liver and biliary tract: Secondary | ICD-10-CM | POA: Diagnosis not present

## 2022-05-14 DIAGNOSIS — R4182 Altered mental status, unspecified: Secondary | ICD-10-CM | POA: Diagnosis not present

## 2022-05-14 DIAGNOSIS — K859 Acute pancreatitis without necrosis or infection, unspecified: Secondary | ICD-10-CM | POA: Diagnosis not present

## 2022-05-14 LAB — COMPREHENSIVE METABOLIC PANEL
ALT: 37 U/L (ref 0–44)
AST: 43 U/L — ABNORMAL HIGH (ref 15–41)
Albumin: 2.5 g/dL — ABNORMAL LOW (ref 3.5–5.0)
Alkaline Phosphatase: 144 U/L — ABNORMAL HIGH (ref 38–126)
Anion gap: 9 (ref 5–15)
BUN: 8 mg/dL (ref 8–23)
CO2: 25 mmol/L (ref 22–32)
Calcium: 8.2 mg/dL — ABNORMAL LOW (ref 8.9–10.3)
Chloride: 102 mmol/L (ref 98–111)
Creatinine, Ser: 0.65 mg/dL (ref 0.44–1.00)
GFR, Estimated: >60 mL/min (ref >60)
Glucose, Bld: 51 mg/dL — ABNORMAL LOW (ref 70–99)
Potassium: 4.1 mmol/L (ref 3.5–5.1)
Sodium: 136 mmol/L (ref 135–145)
Total Bilirubin: 0.6 mg/dL (ref 0.3–1.2)
Total Protein: 5.2 g/dL — ABNORMAL LOW (ref 6.5–8.1)

## 2022-05-14 LAB — LIPASE, BLOOD: Lipase: 503 U/L — ABNORMAL HIGH (ref 11–51)

## 2022-05-14 LAB — MAGNESIUM: Magnesium: 1.8 mg/dL (ref 1.7–2.4)

## 2022-05-14 LAB — CBC WITH DIFFERENTIAL/PLATELET
Abs Immature Granulocytes: 0.02 10*3/uL (ref 0.00–0.07)
Basophils Absolute: 0 10*3/uL (ref 0.0–0.1)
Basophils Relative: 0 %
Eosinophils Absolute: 0.4 10*3/uL (ref 0.0–0.5)
Eosinophils Relative: 6 %
HCT: 36.7 % (ref 36.0–46.0)
Hemoglobin: 12 g/dL (ref 12.0–15.0)
Immature Granulocytes: 0 %
Lymphocytes Relative: 33 %
Lymphs Abs: 2.5 10*3/uL (ref 0.7–4.0)
MCH: 31.3 pg (ref 26.0–34.0)
MCHC: 32.7 g/dL (ref 30.0–36.0)
MCV: 95.6 fL (ref 80.0–100.0)
Monocytes Absolute: 0.4 10*3/uL (ref 0.1–1.0)
Monocytes Relative: 5 %
Neutro Abs: 4.2 10*3/uL (ref 1.7–7.7)
Neutrophils Relative %: 56 %
Platelets: 211 10*3/uL (ref 150–400)
RBC: 3.84 MIL/uL — ABNORMAL LOW (ref 3.87–5.11)
RDW: 12.7 % (ref 11.5–15.5)
WBC: 7.5 10*3/uL (ref 4.0–10.5)
nRBC: 0 % (ref 0.0–0.2)

## 2022-05-14 LAB — TRIGLYCERIDES: Triglycerides: 93 mg/dL (ref <150)

## 2022-05-14 MED ORDER — GADOBUTROL 1 MMOL/ML IV SOLN
6.0000 mL | Freq: Once | INTRAVENOUS | Status: AC | PRN
  Administered 2022-05-14: 6 mL via INTRAVENOUS

## 2022-05-14 NOTE — Consult Note (Addendum)
Rose Bridges: 8:20 AM 05/14/2022  LOS: 4 days    Referring Provider: Dr Starla Link  Primary Care Physician:  Antony Blackbird, MD Primary Gastroenterologist:  unassigned, none    Reason for Consultation:  Acute pancreatitis.     HPI: Rose Bridges is a 62 y.o. female.  PMH osteogenesis imperfecta particularly affecting the jaw.  Multiple jaw/dental surgeries.  Osteoporosis.  Hypertension.  Hyperlipidemia.  Surgeries include ORIF of humerus fracture, left hip arthroplasty, abdominal hysterectomy. Previous colonoscopies on 2 occasion but these were 30 or more years ago, sounds like she may have had IBS.  12/5 -04/07/22 admission to address fracture of the pelvis from a mechanical fall.  Ortho manage this nonoperatively.  Noted to have polycythemia with Hgb up to 17.2.  Discharged to SNF on a months therapy of intranasal calcitonin.  Initially at the SNF, her appetite was excellent.  But she says the quality of the food changed and she stopped eating as much.  Was not having significant nausea or heartburn.  Developed constipation which she normally does not have.  A day or so prior to arrival at the ED she was given MiraLAX and prune juice resulting in output of a large amount of nonbloody, nonmelenic stool.  Developed confusion, agitation, refused to take her meds and was brought to the ED on 1/11, admitted with CBG 241, Proteus mirabilis UTI, acute metabolic encephalopathy.  After arrival to the hospital she developed severe pain in her upper abdomen more so on the right than the left.  Pain radiates to the back.  Nausea but just a single episode of nonbloody emesis.  Lipase 2337..  503.  T. bili normal 0.7.  Alk phos 160..  144.  AST/ALT 84/56... 43/37.  BUN/creatinine normal.  WBCs, Hb, MCV, platelets  normal. CTAP w contrast: Acute pancreatitis with stranding at the head, uncinate and pancreatic neck.  Mild PD dilatation proximally.  No pancreatic mass.  CBD measures up to 1.3 cm but no evidence for CBD stone or extrinsic mass.  Mild gallbladder wall thickening, trace pericholecystic fluid but no evidence for gallstones, sludge. CT head with possible new, age-indeterminate left cerebellar infarct, no intracranial hemorrhage, severe, chronic microvascular ischemic changes and multiple basal ganglier infarcts.  Patient never drank alcohol heavily and has not had anything to drink for such a few years.  Up until she discharged to the SNF 6 weeks ago, she smoked about 4 cigarettes a day but previously had a about a pack a day history.  Before discharging to SNF, she was living at her mother's home and providing home care for her mother who is in her late 82s. Family history of Alzheimer's dementia, MI, multiple strokes in her mother.  Her father died with metastatic lung cancer.  Past Medical History:  Diagnosis Date   Hyperlipemia    Hypertension    Osteogenesis imperfecta    Osteoporosis     Past Surgical History:  Procedure Laterality Date   ABDOMINAL HYSTERECTOMY     HIP ARTHROPLASTY Left 09/26/2015   Procedure: ARTHROPLASTY BIPOLAR HIP (HEMIARTHROPLASTY);  Surgeon: Sheral Apley, MD;  Location: [facility] OR;  Service: Orthopedics;  Laterality: Left;   ORIF HUMERUS FRACTURE Right 11/06/2016   Procedure: OPEN REDUCTION INTERNAL FIXATION (ORIF) PROXIMAL HUMERUS FRACTURE;  Surgeon: Sheral Apley, MD;  Location: [facility] OR;  Service: Orthopedics;  Laterality: Right;    Prior to Admission medications   Medication Sig Start Date End Date Taking? Authorizing Provider  acetaminophen (TYLENOL) 500 MG tablet Take 1,000 mg by mouth 3 (three) times daily. 04/10/22  Yes [provider]  Baclofen 5 MG TABS Take 1 tablet by mouth every 8 (eight) hours as needed for muscle spasms. 04/07/22  Yes  Celine Mans, MD  Baclofen 5 MG TABS Take 10 mg by mouth 2 (two) times daily. 05/02/22 05/17/22 Yes [provider]  calcitonin, salmon, (MIACALCIN/FORTICAL) 200 UNIT/ACT nasal spray Place 1 spray into alternate nostrils daily. 04/08/22  Yes Celine Mans, MD  calcium carbonate (TUMS - DOSED IN MG ELEMENTAL CALCIUM) 500 MG chewable tablet Chew 1 tablet by mouth 2 (two) times daily.   Yes [provider]  folic acid (FOLVITE) 1 MG tablet Take 1 tablet (1 mg total) by mouth daily. 09/09/19  Yes Storm Frisk, MD  oxyCODONE (OXY IR/ROXICODONE) 5 MG immediate release tablet Take 1 tablet (5 mg total) by mouth every 4 (four) hours as needed for severe pain or breakthrough pain. Patient taking differently: Take 5 mg by mouth every 8 (eight) hours. 04/07/22  Yes Shelby Mattocks, DO  thiamine 100 MG tablet Take 1 tablet (100 mg total) by mouth daily. 09/09/19  Yes Storm Frisk, MD  Vitamin D, Ergocalciferol, (DRISDOL) 1.25 MG (50000 UNIT) CAPS capsule Take 1 capsule (50,000 Units total) by mouth every 7 (seven) days. Patient taking differently: Take 50,000 Units by mouth every 7 (seven) days. Thursdays 04/12/22  Yes Celine Mans, MD  acetaminophen (TYLENOL) 325 MG tablet Take 2 tablets (650 mg total) by mouth every 6 (six) hours. Patient not taking: Reported on 05/10/2022 04/07/22   Celine Mans, MD    Scheduled Meds:  calcitonin (salmon)  1 spray Alternating Nares Daily   calcium carbonate  1 tablet Oral BID   enoxaparin (LOVENOX) injection  40 mg Subcutaneous Q24H   folic acid  1 mg Oral Daily   levETIRAcetam  500 mg Oral BID   lidocaine  1 patch Transdermal Daily   ondansetron (ZOFRAN) IV  4 mg Intravenous Q6H   senna-docusate  1 tablet Oral BID   thiamine  100 mg Oral Daily   Vitamin D (Ergocalciferol)  50,000 Units Oral Q7 days   Infusions:  sodium chloride 125 mL/hr at 05/14/22 0016   cefTRIAXone (ROCEPHIN)  IV 2 g (05/13/22 0907)   PRN  Meds: acetaminophen, bisacodyl, HYDROmorphone (DILAUDID) injection, melatonin, mouth rinse, oxyCODONE, polyethylene glycol, prochlorperazine   Allergies as of 05/10/2022 - Review Complete 05/10/2022  Allergen Reaction Noted   Codeine Other (See Comments) 09/25/2015    Family History  Problem Relation Age of Onset   Stroke Mother    Heart attack Mother    Hypertension Mother    Stroke Sister    Stroke Brother    Lung cancer Father     Social History   Socioeconomic History   Marital status: Divorced    Spouse name: Not on file   Number of children: 1   Years of education: College   Highest education level: Not on file  Occupational History   Occupation: Film/video editor  Tobacco Use   Smoking  status: Every Day    Packs/day: 0.25    Types: Cigarettes   Smokeless tobacco: Never  Vaping Use   Vaping Use: Never used  Substance and Sexual Activity   Alcohol use: No    Alcohol/week: 85.0 standard drinks of alcohol    Types: 85 Shots of liquor per week    Comment: No alcohol use since 11/04/16.   Drug use: No   Sexual activity: Not Currently  Other Topics Concern   Not on file  Social History Narrative   Pt lives with her mother.   Right-handed.   1-2 bottles (16Oz each) of [facility] Dew or Dr. Reino KentPepper.   Occasionally drinks Monster drinks.   Social Determinants of Health   Financial Resource Strain: Not on file  Food Insecurity: No Food Insecurity (05/10/2022)   Hunger Vital Sign    Worried About Running Out of Food in the Last Year: Never true    Ran Out of Food in the Last Year: Never true  Transportation Needs: Unmet Transportation Needs (05/10/2022)   PRAPARE - Administrator, Civil ServiceTransportation    Lack of Transportation (Medical): Yes    Lack of Transportation (Non-Medical): Yes  Physical Activity: Not on file  Stress: Not on file  Social Connections: Not on file  Intimate Partner Violence: Not At Risk (05/10/2022)   Humiliation, Afraid, Rape, and Kick questionnaire    Fear of  Current or Ex-Partner: No    Emotionally Abused: No    Physically Abused: No    Sexually Abused: No    REVIEW OF SYSTEMS: Constitutional: No profound fatigue or weakness. ENT: In recent weeks she has had worsening of pain in her left jaw.  No nose bleeds Pulm: Denies cough and dyspnea. CV:  No palpitations, no LE edema.  GU:  No hematuria, no frequency.  Some urinary incontinence where she is not even aware that she is urinating.  No burning/dysuria GI: Denies dysphagia, heartburn. Heme: Denies unusual bleeding or bruising.  Says she has had anemia in the past but never required blood transfusion. Transfusions: None. Neuro:  No headaches, occasional loss of sensation in her toes. Musculoskeletal:   Pain in the first and second toes on both sides. Derm:  No itching, no rash or sores.  Endocrine:  No sweats or chills.  No polyuria or dysuria Immunization: Reviewed. Travel:  None   PHYSICAL EXAM: Vital signs in last 24 hours: Vitals:   05/14/22 0422 05/14/22 0743  BP: (!) 127/54 128/71  Pulse: 76 94  Resp: 16 16  Temp: 98.2 F (36.8 C) 98.5 F (36.9 C)  SpO2: 91% 96%   Wt Readings from Last 3 Encounters:  05/10/22 59 kg  04/03/22 58 kg  11/15/19 58.3 kg    General: Patient is somewhat thin, alert.  Able to answer questions in great detail.  Laying comfortably on the bed. Head: No facial asymmetry or swelling.  No signs of head trauma. Eyes: No conjunctival pallor no scleral icterus EOMI Ears: No obvious hearing deficit Nose: Discharge or congestion Mouth: Loss of bone in the upper and lower jaw.  Edentulous in the upper jaw.  Implants with partial denture on the lower jaw. Neck: No JVD, no thyromegaly Lungs: Clear bilaterally without labored breathing or cough. Heart: RRR.  No MRG.  S1, S2 present. Abdomen: Soft.  Diffusely tender but more so in the right upper quadrant/epigastric region.  No guarding or rebound.  No distention.  Bowel sounds are active..   Rectal:  Deferred Musc/Skeltl: Deformity in the  bones of the fingers.  Tenderness to pressure of the first and second toes bilaterally.  No erythema or swelling in this area. Extremities: No CCE Neurologic: Alert.  Oriented x 3.  Moves all 4 limbs without tremor, strength not tested. Skin: No rash, no sores, no telangiectasia, no significant purpura or bruising. Nodes: No cervical adenopathy. Psych: Pleasant, cooperative, somewhat anxious with pressured speech.  Intake/Output from previous day: 01/14 0701 - 01/15 0700 In: 693.4 [I.V.:543.4; IV Piggyback:150] Out: 600 [Urine:600] Intake/Output this shift: No intake/output data recorded.  LAB RESULTS: Recent Labs    05/14/22 0251  WBC 7.5  HGB 12.0  HCT 36.7  PLT 211   BMET Lab Results  Component Value Date   NA 136 05/14/2022   NA 136 05/13/2022   NA 139 05/12/2022   K 4.1 05/14/2022   K 4.0 05/13/2022   K 4.4 05/12/2022   CL 102 05/14/2022   CL 102 05/13/2022   CL 107 05/12/2022   CO2 25 05/14/2022   CO2 25 05/13/2022   CO2 24 05/12/2022   GLUCOSE 51 (L) 05/14/2022   GLUCOSE 82 05/13/2022   GLUCOSE 96 05/12/2022   BUN 8 05/14/2022   BUN 7 (L) 05/13/2022   BUN 13 05/12/2022   CREATININE 0.65 05/14/2022   CREATININE 0.57 05/13/2022   CREATININE 0.57 05/12/2022   CALCIUM 8.2 (L) 05/14/2022   CALCIUM 8.8 (L) 05/13/2022   CALCIUM 8.4 (L) 05/12/2022   LFT Recent Labs    05/13/22 0227 05/14/22 0251  PROT 5.8* 5.2*  ALBUMIN 2.7* 2.5*  AST 84* 43*  ALT 56* 37  ALKPHOS 160* 144*  BILITOT 0.7 0.6  BILIDIR 0.4*  --   IBILI 0.3  --    PT/INR Lab Results  Component Value Date   INR 0.95 09/25/2015   Hepatitis Panel No results for input(s): "HEPBSAG", "HCVAB", "HEPAIGM", "HEPBIGM" in the last 72 hours. C-Diff No components found for: "CDIFF" Lipase     Component Value Date/Time   LIPASE 503 (H) 05/14/2022 0251    Drugs of Abuse     Component Value Date/Time   LABOPIA NONE DETECTED 05/10/2022 1639    COCAINSCRNUR NONE DETECTED 05/10/2022 1639   LABBENZ NONE DETECTED 05/10/2022 1639   AMPHETMU NONE DETECTED 05/10/2022 1639   THCU NONE DETECTED 05/10/2022 1639   LABBARB NONE DETECTED 05/10/2022 1639     RADIOLOGY STUDIES: CT ABDOMEN PELVIS W CONTRAST  Result Date: 05/13/2022 CLINICAL DATA:  Acute severe pancreatitis. EXAM: CT ABDOMEN AND PELVIS WITH CONTRAST TECHNIQUE: Multidetector CT imaging of the abdomen and pelvis was performed using the standard protocol following bolus administration of intravenous contrast. RADIATION DOSE REDUCTION: This exam was performed according to the departmental dose-optimization program which includes automated exposure control, adjustment of the mA and/or kV according to patient size and/or use of iterative reconstruction technique. CONTRAST:  53mL OMNIPAQUE IOHEXOL 350 MG/ML SOLN COMPARISON:  No prior abdomen pelvis CT. A CTA chest is available for review from 11/06/2016. CT studies of the pelvis without contrast are also available from 04/03/2022 and 04/19/2018, and also CT lumbar spine 04/03/2022. FINDINGS: Lower chest: Small layering right and trace left pleural effusions. There is mild compressive atelectatic effect in the lower lobes without infiltrates. Chronic mild elevation right hemidiaphragm. Lung bases are otherwise clear. The cardiac size is normal. Symmetric bilateral dense calcifications in both breasts most likely due to fibrocystic disease, alternatively could relate to prior breast implant removal. This was seen previously. Hepatobiliary: There is intrahepatic and extrahepatic biliary  dilatation. The intrahepatic biliary dilatation is greatest in the left lobe. The common bile duct measuring up to 1.3 cm. There is no calcified intraductal stone or ductal mass. Etiology could be due to ampullary stricture, spasm or small ampullary mass although no mass is definitively seen. There is mild thickening of the gallbladder free wall and trace pericholecystic  fluid. No calcified gallstone is seen. Findings could be congestive etiology, reactive due to adjacent pancreatitis, or acalculous cholecystitis. The liver is normal in size without mass enhancement. Pancreas: There are stranding changes along side the head, uncinate process and neck of the pancreas. Mild pancreatic ductal dilatation greater proximally. No pancreatic mass enhancement is seen. Findings consistent with acute interstitial pancreatitis. There is no peripancreatic hemorrhage, abscess or free air. There is trace nonlocalizing fluid inferior to the pancreas and posterior to the proximal pancreas. There is no focal pancreatic hypoenhancement concern for pancreatic necrosis. Inflammatory changes extend into the pancreaticoduodenal groove. Spleen: No abnormality. Adrenals/Urinary Tract: No adrenal or renal mass is seen. There is no hydronephrosis. There is contrast in the collecting systems. Intrarenal stones could be obscured but there is no hydronephrosis no visible ureteral filling defects. The left posterolateral bladder wall is obscured by spray artifact from a left hip replacement. The bladder wall is normal where visible. Stomach/Bowel: Thickened folds are again noted in the stomach similar to 2018, possible chronic gastritis. There is duodenal fold thickening which is probably reactive due to the pancreatitis. There is a 6.4 x 5 by 4.5 cm duodenal diverticulum at the junction of the descending and horizontal sections, with retained contrast. Rest of the small bowel is unremarkable and fills well with contrast. The appendix is normal. There is contrast in the appendix. No focal abnormality of the large bowel wall. Vascular/Lymphatic: Aortic atherosclerosis. No enlarged abdominal or pelvic lymph nodes. Reproductive: Status post hysterectomy. No adnexal masses. There are multiple pelvic phleboliths. Musculoskeletal: There is interval linear sclerotic healing response laterally in both sacral ala with the  last pelvic CT showing acute sacral insufficiency fractures. Sacral fracture lines are still partially visible compared with 04/03/2022. There is additional linear healing response in the left pubic bone with left pubic fracture previously noted. No new fracture has become apparent. Chronic compression fractures of L1 and 3 are redemonstrated unchanged. Diffuse osteopenia is greater than expected for a 62 year old. Left hip replacement again noted. Mild degenerative change lumbar spine. Other: Small ascites in the presacral pelvis. No large drainable pocket. No free air, hemorrhage or abscess. There are no incarcerated hernias. There are scattered air pockets in the subcutaneous plane in the abdominal wall most likely reflecting sites of recent injection. Please correlate clinically. IMPRESSION: 1. Acute interstitial proximal pancreatitis without evidence of pancreatic necrosis, hemorrhage or abscess. 2. There is intrahepatic and extrahepatic biliary dilatation, with the common bile duct measuring up to 1.3 cm. No calcified intraductal stone or mass is seen. Etiology could be due to ampullary stricture, spasm or small ampullary mass. 3. Mild gallbladder wall thickening and trace pericholecystic fluid. No calcified gallstone. Findings could be congestive, reactive due to adjacent pancreatitis, or from acalculous cholecystitis. 4. 6.4 x 5 x 4.5 cm duodenal diverticulum at the junction of the descending and horizontal portions. 5. Aortic atherosclerosis. 6. Small right and trace left pleural effusions. 7. Healing bilateral sacral insufficiency fractures and left pubic bone fracture. 8. Osteopenia and degenerative change. 9. Air pockets in the subcutaneous abdominal wall most likely due to recent injections. Aortic Atherosclerosis (ICD10-I70.0). Electronically Signed  By: Telford Nab M.D.   On: 05/13/2022 21:18   DG Abd 1 View  Result Date: 05/12/2022 CLINICAL DATA:  62 year old female with vomiting EXAM:  ABDOMEN - 1 VIEW COMPARISON:  None Available. FINDINGS: Nonspecific bowel gas pattern. Gas is noted throughout loops of small bowel without significant dilation. No air-fluid levels on this supine radiograph. No large free air. Left hip arthroplasty. IMPRESSION: Nonspecific bowel gas pattern with scattered air throughout small and large bowel. No obvious obstruction or free air. Electronically Signed   By: Jacqulynn Cadet M.D.   On: 05/12/2022 15:43      IMPRESSION:   Acute pancreatitis, possibly biliary.  MRI/MRCP ordered, to be completed sometime today.  No history of recent alcohol intake so this is not alcoholic.  Other causes could be medications.    Proteus UTI.       Osteogenesis imperfecta.      Recent non-displaced pelvic fracture, non-operative mgt.       AMS, improved.  CT head w old and indeterminate age strokes, ischemic changes  PLAN:       Await  MRCP.      Check triglycerides, ordered.     Rose Bridges  05/14/2022, 8:20 AM Phone [phone]

## 2022-05-14 NOTE — Progress Notes (Signed)
Physical Therapy Treatment Patient Details Name: Rose Bridges MRN: [id number] DOB: [birth year] Today's Date: 05/14/2022   History of Present Illness Pt admitted 1/11 from [facility] with AMS, paranoia and agitation. Pt was at SNF for short term rehab following fall with pelvic fxs 03/31/22. PMH:  polycythemia, osteogenesis imperfecta, osteoporosis, hypertension, hyperlipidemia, seizure disorder    PT Comments    Pt received in bed and wanting to get up and ambulate. Pt able to come to EOB without physical assist but needed increased time. Min guard to stand to RW. Pt initially feeling that LLE would give way in standing, cues for pushing through RW and LLE did not give way and improved with distance. Pt ambulated 30' with RW and min A. Noted swelling BLE's below knees L>R and spoke with RN about TED hose. Pt relays that she does not like [name] because it takes them an hour to come when she pushes her call light. Agreeable to continued rehab though for pelvic fxs. PT will continue to follow.    Recommendations for follow up therapy are one component of a multi-disciplinary discharge planning process, led by the attending physician.  Recommendations may be updated based on patient status, additional functional criteria and insurance authorization.  Follow Up Recommendations  Skilled nursing-short term rehab (<3 hours/day) Can patient physically be transported by private vehicle: Yes   Assistance Recommended at Discharge Intermittent Supervision/Assistance  Patient can return home with the following A little help with walking and/or transfers;A little help with bathing/dressing/bathroom;Help with stairs or ramp for entrance;Assistance with cooking/housework;Assist for transportation   Equipment Recommendations  None recommended by PT    Recommendations for Other Services       Precautions / Restrictions Precautions Precautions: Fall Restrictions Weight Bearing Restrictions: No      Mobility  Bed Mobility Overal bed mobility: Needs Assistance Bed Mobility: Supine to Sit     Supine to sit: Supervision     General bed mobility comments: able to come to EOB with supervision but no physical assist    Transfers Overall transfer level: Needs assistance Equipment used: Rolling walker (2 wheels) Transfers: Sit to/from Stand Sit to Stand: Min guard           General transfer comment: min guard from elevated bed    Ambulation/Gait Ambulation/Gait assistance: Min assist Gait Distance (Feet): 30 Feet Assistive device: Rolling walker (2 wheels) Gait Pattern/deviations: Step-through pattern Gait velocity: decreased Gait velocity interpretation: <1.31 ft/sec, indicative of household ambulator   General Gait Details: pt intiallt reporting she felt like LLE would give out under her. Cues for bearing wt through UE"s for safety and min A given. Improved with distance   Stairs             Wheelchair Mobility    Modified Rankin (Stroke Patients Only)       Balance Overall balance assessment: Needs assistance, History of Falls Sitting-balance support: Feet supported, No upper extremity supported Sitting balance-Leahy Scale: Good     Standing balance support: Reliant on assistive device for balance Standing balance-Leahy Scale: Poor                              Cognition Arousal/Alertness: Awake/alert Behavior During Therapy: WFL for tasks assessed/performed Overall Cognitive Status: No family/caregiver present to determine baseline cognitive functioning  General Comments: WFL for basic conversation but question higher level cog and if that is baseline for her        Exercises General Exercises - Lower Extremity Ankle Circles/Pumps: AROM, Both, 10 reps, Seated    General Comments General comments (skin integrity, edema, etc.): noted swelling LE's L>R, spoke with RN about knee high  TED hose      Pertinent Vitals/Pain Pain Assessment Pain Assessment: Faces Faces Pain Scale: Hurts little more Pain Location: L pelvis sitting in chair Pain Descriptors / Indicators: Sore Pain Intervention(s): Limited activity within patient's tolerance, Monitored during session    Home Living                          Prior Function            PT Goals (current goals can now be found in the care plan section) Acute Rehab PT Goals Patient Stated Goal: walk PT Goal Formulation: With patient Time For Goal Achievement: 05/25/22 Potential to Achieve Goals: Good Progress towards PT goals: Progressing toward goals    Frequency    Min 3X/week      PT Plan Current plan remains appropriate    Co-evaluation              AM-PAC PT "6 Clicks" Mobility   Outcome Measure  Help needed turning from your back to your side while in a flat bed without using bedrails?: A Little Help needed moving from lying on your back to sitting on the side of a flat bed without using bedrails?: None Help needed moving to and from a bed to a chair (including a wheelchair)?: A Little Help needed standing up from a chair using your arms (e.g., wheelchair or bedside chair)?: A Little Help needed to walk in hospital room?: A Little Help needed climbing 3-5 steps with a railing? : Total 6 Click Score: 17    End of Session Equipment Utilized During Treatment: Gait belt Activity Tolerance: Patient tolerated treatment well Patient left: with call bell/phone within reach;in chair;with chair alarm set Nurse Communication: Mobility status PT Visit Diagnosis: Difficulty in walking, not elsewhere classified (R26.2);Muscle weakness (generalized) (M62.81)     Time: 2637-8588 PT Time Calculation (min) (ACUTE ONLY): 31 min  Charges:  $Gait Training: 23-37 mins                     Leighton Roach, [place] chat preferred Office [place] 05/14/2022, 3:31 PM

## 2022-05-14 NOTE — Progress Notes (Signed)
Patient's daughter in-law is asking for the MD to call her today.

## 2022-05-14 NOTE — Progress Notes (Signed)
PROGRESS NOTE    Rose Bridges  [id number] DOB: November 10, 1960 DOA: 05/10/2022 PCP: Antony Blackbird, MD   Brief Narrative:  62 y.o. female with medical history significant for polycythemia, osteogenesis imperfecta, osteoporosis, hypertension, hyperlipidemia, seizure disorder, history of falls, recent pelvic fracture after mechanical fall on 03/31/2022 in her Home resulting in pelvic fracture, recently admitted by family medicine residency teaching service on 04/03/22 and discharged on 04/07/22 (Not FM patient, per resident Dr. Nancy Fetter) sent from SNF due to recent behavioral change, paranoid and agitation with pressured speech.  On presentation, UA was suggestive of UTI.  Head CT was notable for a new age-indeterminate left cerebellar infarct compared to 2018, no intracranial hemorrhage;  sequela of severe chronic microvascular ischemic change with multiple superimposed basal ganglia infarcts.  WBC 10.6, lactic acid 3.0.  She was started on IV fluids and antibiotics.  Assessment & Plan:   UTI: Present on admission -Continue Rocephin.  Urine culture grew more than 100,000 colonies per mL of E. coli and Proteus mirabilis  Acute metabolic encephalopathy Agitation/delirium -Patient presented with behavioral change, catatonia and agitation possibly from UTI. -Mental status improving -CT head as above. -Monitor mental status.  Fall precautions.  PT recommends SNF placement.  Social worker following  Possible acute biliary pancreatitis Elevated LFTs -Started having vomiting and severe abdominal pain since 05/12/2022.  Lipase 2337.   -Continue IV fluids and n.p.o. for now.  Lipase 503 this morning.  CT of abdomen and pelvis showed acute pancreatitis without necrosis, hemorrhage or abscess along with intrahepatic and extrahepatic biliary dilatation and mild gallbladder wall thickening and trace pericholecystic fluid.  Consult GI.  Will order MRCP.   -LFTs improving but still has significant elevation of  bilirubin.  Monitor  Seizure disorder with noncompliance with AEDs -Continue Keppra.  Seizure precautions.  Outpatient follow-up with neurology  Osteogenesis imperfecta -Outpatient follow-up  Polycythemia -Unclear if primary versus secondary.  Hemoglobin much improved after hydration.  Hypomagnesemia -Improving  History of recent pelvic fracture in December 2023 -Managed conservatively -Continue current pain management.  Physical deconditioning -Patient presenting from SNF.  Does not want to go back to the same SNF.  TOC following  DVT prophylaxis: Lovenox Code Status: Full Family Communication: Granddaughter at bedside on 05/12/2022 Disposition Plan: Status is: Inpatient Remains inpatient appropriate because: Of severity of illness.  Need for IV antibiotics, IV fluids    Consultants: Consult GI  Procedures: None  Antimicrobials: Rocephin from 05/10/2022 onwards   Subjective: Patient seen and examined at bedside.  Still complains of intermittent severe abdominal pain and nausea.  No fever, chest pain or shortness of breath reported.  Objective: Vitals:   05/13/22 0801 05/13/22 1457 05/13/22 2032 05/14/22 0422  BP: (!) 92/48 113/69 (!) 147/68 (!) 127/54  Pulse: 80 82 78 76  Resp: 15 17 18 16   Temp: 98.7 F (37.1 C) 98.5 F (36.9 C) 97.9 F (36.6 C) 98.2 F (36.8 C)  TempSrc: Oral Oral Oral Oral  SpO2: 94% 95% 97% 91%  Weight:      Height:        Intake/Output Summary (Last 24 hours) at 05/14/2022 0740 Last data filed at 05/13/2022 1737 Gross per 24 hour  Intake 693.43 ml  Output 600 ml  Net 93.43 ml    Filed Weights   05/10/22 1324  Weight: 59 kg    Examination:  General: On room air currently.  No distress.  Looks chronically ill and deconditioned. ENT/neck: No obvious JVD elevation or palpable thyromegaly noted respiratory: Decreased  breath sounds at bases bilaterally with some crackles CVS: S1-S2 heard; rate controlled mostly abdominal: Soft,  epigastric tenderness present; slightly distended; no organomegaly, normal bowel sounds heard  extremities: No clubbing or edema  CNS: Awake and oriented; answering questions appropriately.  No focal neurologic deficit.  Moves extremities  lymph: No cervical lymphadenopathy palpable  skin: No obvious ecchymosis/lesions psych: Showing no signs of agitation.  Affect is mostly flat. Musculoskeletal: No obvious joint swelling/tenderness/deformity   Data Reviewed: I have personally reviewed following labs and imaging studies  CBC: Recent Labs  Lab 05/10/22 1521 05/10/22 2150 05/11/22 0312 05/14/22 0251  WBC 10.6* 9.8 7.6 7.5  NEUTROABS 7.2  --   --  4.2  HGB 17.2* 14.0 12.4 12.0  HCT 53.1* 42.6 37.4 36.7  MCV 95.7 95.7 95.9 95.6  PLT 253 226 211 654    Basic Metabolic Panel: Recent Labs  Lab 05/10/22 1521 05/10/22 2150 05/11/22 0312 05/12/22 0249 05/13/22 0227 05/14/22 0251  NA 138  --  142 139 136 136  K 4.0  --  4.0 4.4 4.0 4.1  CL 102  --  110 107 102 102  CO2 22  --  25 24 25 25   GLUCOSE 161*  --  120* 96 82 51*  BUN 23  --  17 13 7* 8  CREATININE 0.85 0.80 0.61 0.57 0.57 0.65  CALCIUM 9.5  --  7.8* 8.4* 8.8* 8.2*  MG 1.8  --  1.5* 1.9 1.5* 1.8  PHOS 3.0  --   --   --   --   --     GFR: Estimated Creatinine Clearance: 63 mL/min (by C-G formula based on SCr of 0.65 mg/dL). Liver Function Tests: Recent Labs  Lab 05/10/22 1521 05/13/22 0227 05/14/22 0251  AST 31 84* 43*  ALT 17 56* 37  ALKPHOS 135* 160* 144*  BILITOT 0.7 0.7 0.6  PROT 7.7 5.8* 5.2*  ALBUMIN 3.7 2.7* 2.5*    Recent Labs  Lab 05/13/22 0227 05/14/22 0251  LIPASE 2,337* 503*    Recent Labs  Lab 05/10/22 1521  AMMONIA 22    Coagulation Profile: No results for input(s): "INR", "PROTIME" in the last 168 hours. Cardiac Enzymes: No results for input(s): "CKTOTAL", "CKMB", "CKMBINDEX", "TROPONINI" in the last 168 hours. BNP (last 3 results) No results for input(s): "PROBNP" in the  last 8760 hours. HbA1C: No results for input(s): "HGBA1C" in the last 72 hours. CBG: No results for input(s): "GLUCAP" in the last 168 hours. Lipid Profile: No results for input(s): "CHOL", "HDL", "LDLCALC", "TRIG", "CHOLHDL", "LDLDIRECT" in the last 72 hours. Thyroid Function Tests: No results for input(s): "TSH", "T4TOTAL", "FREET4", "T3FREE", "THYROIDAB" in the last 72 hours.  Anemia Panel: Recent Labs    05/12/22 0249  VITAMINB12 437    Sepsis Labs: Recent Labs  Lab 05/10/22 1521 05/10/22 1639  LATICACIDVEN 3.0* 1.6     Recent Results (from the past 240 hour(s))  Urine Culture     Status: Abnormal   Collection Time: 05/10/22  4:39 PM   Specimen: Urine, Clean Catch  Result Value Ref Range Status   Specimen Description URINE, CLEAN CATCH  Final   Special Requests   Final    NONE Performed at [facility], [place] [address], [place], [place] [address]    Culture (A)  Final    >=100,000 COLONIES/mL ESCHERICHIA COLI >=100,000 COLONIES/mL PROTEUS MIRABILIS    Report Status 05/13/2022 FINAL  Final   Organism ID, Bacteria ESCHERICHIA COLI (A)  Final   Organism ID, Bacteria PROTEUS MIRABILIS (A)  Final      Susceptibility   Escherichia coli - MIC*    AMPICILLIN <=2 SENSITIVE Sensitive     CEFAZOLIN <=4 SENSITIVE Sensitive     CEFEPIME <=0.12 SENSITIVE Sensitive     CEFTRIAXONE <=0.25 SENSITIVE Sensitive     CIPROFLOXACIN <=0.25 SENSITIVE Sensitive     GENTAMICIN <=1 SENSITIVE Sensitive     IMIPENEM <=0.25 SENSITIVE Sensitive     NITROFURANTOIN <=16 SENSITIVE Sensitive     TRIMETH/SULFA <=20 SENSITIVE Sensitive     AMPICILLIN/SULBACTAM <=2 SENSITIVE Sensitive     PIP/TAZO <=4 SENSITIVE Sensitive     * >=100,000 COLONIES/mL ESCHERICHIA COLI   Proteus mirabilis - MIC*    AMPICILLIN <=2 SENSITIVE Sensitive     CEFAZOLIN <=4 SENSITIVE Sensitive     CEFEPIME <=0.12 SENSITIVE Sensitive     CEFTRIAXONE <=0.25 SENSITIVE Sensitive     CIPROFLOXACIN <=0.25 SENSITIVE  Sensitive     GENTAMICIN <=1 SENSITIVE Sensitive     IMIPENEM 1 SENSITIVE Sensitive     NITROFURANTOIN 128 RESISTANT Resistant     TRIMETH/SULFA <=20 SENSITIVE Sensitive     AMPICILLIN/SULBACTAM <=2 SENSITIVE Sensitive     PIP/TAZO <=4 SENSITIVE Sensitive     * >=100,000 COLONIES/mL PROTEUS MIRABILIS         Radiology Studies: CT ABDOMEN PELVIS W CONTRAST  Result Date: 05/13/2022 CLINICAL DATA:  Acute severe pancreatitis. EXAM: CT ABDOMEN AND PELVIS WITH CONTRAST TECHNIQUE: Multidetector CT imaging of the abdomen and pelvis was performed using the standard protocol following bolus administration of intravenous contrast. RADIATION DOSE REDUCTION: This exam was performed according to the departmental dose-optimization program which includes automated exposure control, adjustment of the mA and/or kV according to patient size and/or use of iterative reconstruction technique. CONTRAST:  22mL OMNIPAQUE IOHEXOL 350 MG/ML SOLN COMPARISON:  No prior abdomen pelvis CT. A CTA chest is available for review from 11/06/2016. CT studies of the pelvis without contrast are also available from 04/03/2022 and 04/19/2018, and also CT lumbar spine 04/03/2022. FINDINGS: Lower chest: Small layering right and trace left pleural effusions. There is mild compressive atelectatic effect in the lower lobes without infiltrates. Chronic mild elevation right hemidiaphragm. Lung bases are otherwise clear. The cardiac size is normal. Symmetric bilateral dense calcifications in both breasts most likely due to fibrocystic disease, alternatively could relate to prior breast implant removal. This was seen previously. Hepatobiliary: There is intrahepatic and extrahepatic biliary dilatation. The intrahepatic biliary dilatation is greatest in the left lobe. The common bile duct measuring up to 1.3 cm. There is no calcified intraductal stone or ductal mass. Etiology could be due to ampullary stricture, spasm or small ampullary mass although  no mass is definitively seen. There is mild thickening of the gallbladder free wall and trace pericholecystic fluid. No calcified gallstone is seen. Findings could be congestive etiology, reactive due to adjacent pancreatitis, or acalculous cholecystitis. The liver is normal in size without mass enhancement. Pancreas: There are stranding changes along side the head, uncinate process and neck of the pancreas. Mild pancreatic ductal dilatation greater proximally. No pancreatic mass enhancement is seen. Findings consistent with acute interstitial pancreatitis. There is no peripancreatic hemorrhage, abscess or free air. There is trace nonlocalizing fluid inferior to the pancreas and posterior to the proximal pancreas. There is no focal pancreatic hypoenhancement concern for pancreatic necrosis. Inflammatory changes extend into the pancreaticoduodenal groove. Spleen: No abnormality. Adrenals/Urinary Tract: No adrenal or renal mass is seen. There is  no hydronephrosis. There is contrast in the collecting systems. Intrarenal stones could be obscured but there is no hydronephrosis no visible ureteral filling defects. The left posterolateral bladder wall is obscured by spray artifact from a left hip replacement. The bladder wall is normal where visible. Stomach/Bowel: Thickened folds are again noted in the stomach similar to 2018, possible chronic gastritis. There is duodenal fold thickening which is probably reactive due to the pancreatitis. There is a 6.4 x 5 by 4.5 cm duodenal diverticulum at the junction of the descending and horizontal sections, with retained contrast. Rest of the small bowel is unremarkable and fills well with contrast. The appendix is normal. There is contrast in the appendix. No focal abnormality of the large bowel wall. Vascular/Lymphatic: Aortic atherosclerosis. No enlarged abdominal or pelvic lymph nodes. Reproductive: Status post hysterectomy. No adnexal masses. There are multiple pelvic  phleboliths. Musculoskeletal: There is interval linear sclerotic healing response laterally in both sacral ala with the last pelvic CT showing acute sacral insufficiency fractures. Sacral fracture lines are still partially visible compared with 04/03/2022. There is additional linear healing response in the left pubic bone with left pubic fracture previously noted. No new fracture has become apparent. Chronic compression fractures of L1 and 3 are redemonstrated unchanged. Diffuse osteopenia is greater than expected for a 62 year old. Left hip replacement again noted. Mild degenerative change lumbar spine. Other: Small ascites in the presacral pelvis. No large drainable pocket. No free air, hemorrhage or abscess. There are no incarcerated hernias. There are scattered air pockets in the subcutaneous plane in the abdominal wall most likely reflecting sites of recent injection. Please correlate clinically. IMPRESSION: 1. Acute interstitial proximal pancreatitis without evidence of pancreatic necrosis, hemorrhage or abscess. 2. There is intrahepatic and extrahepatic biliary dilatation, with the common bile duct measuring up to 1.3 cm. No calcified intraductal stone or mass is seen. Etiology could be due to ampullary stricture, spasm or small ampullary mass. 3. Mild gallbladder wall thickening and trace pericholecystic fluid. No calcified gallstone. Findings could be congestive, reactive due to adjacent pancreatitis, or from acalculous cholecystitis. 4. 6.4 x 5 x 4.5 cm duodenal diverticulum at the junction of the descending and horizontal portions. 5. Aortic atherosclerosis. 6. Small right and trace left pleural effusions. 7. Healing bilateral sacral insufficiency fractures and left pubic bone fracture. 8. Osteopenia and degenerative change. 9. Air pockets in the subcutaneous abdominal wall most likely due to recent injections. Aortic Atherosclerosis (ICD10-I70.0). Electronically Signed   By: Telford Nab M.D.   On:  05/13/2022 21:18   DG Abd 1 View  Result Date: 05/12/2022 CLINICAL DATA:  62 year old female with vomiting EXAM: ABDOMEN - 1 VIEW COMPARISON:  None Available. FINDINGS: Nonspecific bowel gas pattern. Gas is noted throughout loops of small bowel without significant dilation. No air-fluid levels on this supine radiograph. No large free air. Left hip arthroplasty. IMPRESSION: Nonspecific bowel gas pattern with scattered air throughout small and large bowel. No obvious obstruction or free air. Electronically Signed   By: Jacqulynn Cadet M.D.   On: 05/12/2022 15:43        Scheduled Meds:  calcitonin (salmon)  1 spray Alternating Nares Daily   calcium carbonate  1 tablet Oral BID   enoxaparin (LOVENOX) injection  40 mg Subcutaneous [id number]   folic acid  1 mg Oral Daily   levETIRAcetam  500 mg Oral BID   lidocaine  1 patch Transdermal Daily   ondansetron (ZOFRAN) IV  4 mg Intravenous Q6H   senna-docusate  1  tablet Oral BID   thiamine  100 mg Oral Daily   Vitamin D (Ergocalciferol)  50,000 Units Oral Q7 days   Continuous Infusions:  sodium chloride 125 mL/hr at 05/14/22 0016   cefTRIAXone (ROCEPHIN)  IV 2 g (05/13/22 3474)          Glade Lloyd, MD [facility] 05/14/2022, 7:40 AM

## 2022-05-14 NOTE — TOC Progression Note (Signed)
Transition of Care [facility]) - Progression Note    Patient Details  Name: Rose Bridges MRN: [id number]   Date of Birth: 1960/06/29  Transition of Care [facility]) CM/SW [place], [place] Phone Number: 05/14/2022, 11:12 AM  Clinical Narrative:     CSW spoke with SIL Heather, who pt has requested assist her in making decisions. Choices for SNF were offered to [facility] and Medicare ratings discussed. Nira Conn states she would like pt to return to [facility]. Nira Conn states pt's sister is at [place], she will offer that option to pt, to have Charna Archer do an in person review, but pt will likely return to [facility] at [place]. Family to discuss with pt. Heather notes she has not been able to get an update or a call back from the medical team for pt's admission. Heather spoke with the night RN, who was not able to answer her questions. Night RN did put in a note that MD was requested to follow up with Heather. CSW requested RN speak with Nira Conn to answer any questions and provide an update. Nira Conn states she has been working with pt experience to address these concerns. TOC will continue to follow for DC needs.   Expected Discharge Plan: [place] Barriers to Discharge: SNF Pending bed offer, Continued Medical Work up  Expected Discharge Plan and Services     Post Acute Care Choice: [place]                                         Social Determinants of Health (SDOH) Interventions SDOH Screenings   Food Insecurity: No Food Insecurity (05/10/2022)  Housing: Low Risk  (05/10/2022)  Transportation Needs: Unmet Transportation Needs (05/10/2022)  Utilities: Not At Risk (05/10/2022)  Depression (PHQ2-9): Medium Risk (09/09/2019)  Tobacco Use: High Risk (05/10/2022)    Readmission Risk Interventions     No data to display

## 2022-05-15 DIAGNOSIS — K859 Acute pancreatitis without necrosis or infection, unspecified: Secondary | ICD-10-CM | POA: Diagnosis not present

## 2022-05-15 DIAGNOSIS — R932 Abnormal findings on diagnostic imaging of liver and biliary tract: Secondary | ICD-10-CM

## 2022-05-15 DIAGNOSIS — R4182 Altered mental status, unspecified: Secondary | ICD-10-CM | POA: Diagnosis not present

## 2022-05-15 LAB — GLUCOSE, CAPILLARY
Glucose-Capillary: 32 mg/dL — CL (ref 70–99)
Glucose-Capillary: 189 mg/dL — ABNORMAL HIGH (ref 70–99)
Glucose-Capillary: 85 mg/dL (ref 70–99)
Glucose-Capillary: 119 mg/dL — ABNORMAL HIGH (ref 70–99)
Glucose-Capillary: 119 mg/dL — ABNORMAL HIGH (ref 70–99)
Glucose-Capillary: 154 mg/dL — ABNORMAL HIGH (ref 70–99)

## 2022-05-15 LAB — CBC WITH DIFFERENTIAL/PLATELET
Abs Immature Granulocytes: 0.01 10*3/uL (ref 0.00–0.07)
Basophils Absolute: 0 10*3/uL (ref 0.0–0.1)
Basophils Relative: 1 %
Eosinophils Absolute: 0.5 10*3/uL (ref 0.0–0.5)
Eosinophils Relative: 7 %
HCT: 36.9 % (ref 36.0–46.0)
Hemoglobin: 12 g/dL (ref 12.0–15.0)
Immature Granulocytes: 0 %
Lymphocytes Relative: 30 %
Lymphs Abs: 2 10*3/uL (ref 0.7–4.0)
MCH: 31.4 pg (ref 26.0–34.0)
MCHC: 32.5 g/dL (ref 30.0–36.0)
MCV: 96.6 fL (ref 80.0–100.0)
Monocytes Absolute: 0.4 10*3/uL (ref 0.1–1.0)
Monocytes Relative: 7 %
Neutro Abs: 3.7 10*3/uL (ref 1.7–7.7)
Neutrophils Relative %: 55 %
Platelets: 221 10*3/uL (ref 150–400)
RBC: 3.82 MIL/uL — ABNORMAL LOW (ref 3.87–5.11)
RDW: 12.6 % (ref 11.5–15.5)
WBC: 6.6 10*3/uL (ref 4.0–10.5)
nRBC: 0 % (ref 0.0–0.2)

## 2022-05-15 LAB — COMPREHENSIVE METABOLIC PANEL
ALT: 30 U/L (ref 0–44)
AST: 32 U/L (ref 15–41)
Albumin: 2.6 g/dL — ABNORMAL LOW (ref 3.5–5.0)
Alkaline Phosphatase: 149 U/L — ABNORMAL HIGH (ref 38–126)
Anion gap: 11 (ref 5–15)
BUN: 7 mg/dL — ABNORMAL LOW (ref 8–23)
CO2: 23 mmol/L (ref 22–32)
Calcium: 8.8 mg/dL — ABNORMAL LOW (ref 8.9–10.3)
Chloride: 101 mmol/L (ref 98–111)
Creatinine, Ser: 0.69 mg/dL (ref 0.44–1.00)
GFR, Estimated: >60 mL/min (ref >60)
Glucose, Bld: 34 mg/dL — CL (ref 70–99)
Potassium: 4.2 mmol/L (ref 3.5–5.1)
Sodium: 135 mmol/L (ref 135–145)
Total Bilirubin: 0.8 mg/dL (ref 0.3–1.2)
Total Protein: 5.6 g/dL — ABNORMAL LOW (ref 6.5–8.1)

## 2022-05-15 LAB — MAGNESIUM: Magnesium: 1.4 mg/dL — ABNORMAL LOW (ref 1.7–2.4)

## 2022-05-15 MED ORDER — MAGNESIUM SULFATE 2 GM/50ML IV SOLN
2.0000 g | Freq: Once | INTRAVENOUS | Status: AC
  Administered 2022-05-15: 2 g via INTRAVENOUS
  Filled 2022-05-15: qty 50

## 2022-05-15 MED ORDER — DEXTROSE-NACL 5-0.9 % IV SOLN: INTRAVENOUS | Status: DC

## 2022-05-15 MED ORDER — DEXTROSE 50 % IV SOLN
1.0000 | Freq: Once | INTRAVENOUS | Status: AC
  Administered 2022-05-15: 50 mL via INTRAVENOUS
  Filled 2022-05-15: qty 50

## 2022-05-15 NOTE — Progress Notes (Signed)
Hypoglycemic Event  CBG: 32  Treatment: D50 50 mL (25 gm)  Symptoms: None  Follow-up CBG: Time:0506 CBG Result:189  Possible Reasons for Event: Inadequate meal intake  Comments/MD notified:James Olena Heckle NP    Rockne Menghini  Silverio Lay

## 2022-05-15 NOTE — Progress Notes (Signed)
Zofran given to pt. Pt currently stated that she is not having any pain. Will continue to monitor pt.

## 2022-05-15 NOTE — Care Management Important Message (Signed)
Important Message  Patient Details  Name: Rose Bridges MRN: [id number] Date of Birth: 06-07-1960   Medicare Important Message Given:  Yes     Hannah Beat 05/15/2022, 3:45 PM

## 2022-05-15 NOTE — Progress Notes (Signed)
Patient moaning in discomfort. Dilaudid pain med given.

## 2022-05-15 NOTE — Progress Notes (Signed)
Daily Rounding Note  05/15/2022, 9:04 AM  LOS: 5 days   SUBJECTIVE:   Chief complaint: Acute pancreatitis.       Abdominal pain, points to umbilical area, persists.  Deep breathes accelerate pain.   Rcvd 3 mL dilaudid, 5 mg Oxycodone yesterday.  No pain meds yet today.    No nausea. Emotionally upset because her brother informed pt that their mother is declining (alzheimers pt cared for at home, age late 39s)  OBJECTIVE:         Vital signs in last 24 hours:    Temp:  [98.1 F (36.7 C)-98.8 F (37.1 C)] 98.2 F (36.8 C) (01/16 0828) Pulse Rate:  [76-85] 76 (01/16 0828) Resp:  [16-17] 17 (01/16 0828) BP: (125-151)/(62-79) 149/62 (01/16 0828) SpO2:  [92 %-98 %] 97 % (01/16 0828) Last BM Date : 05/10/22 Filed Weights   05/10/22 1324  Weight: 59 kg   General: thin, chronically ill looking, pale.  Alert and near tears, anxious   Heart: RRR Chest: clear wo dyspnea or cough.   Abdomen: Soft, ND, BS normal but reduced.  Diffusely tender wo guard/rebound  Extremities: non pitting UE edema.  No LE edema Neuro/Psych:  oriented x 3.  Overwrought, emotional, near tears, moaning in distress at times.    Intake/Output from previous day: 01/15 0701 - 01/16 0700 In: -  Out: 1500 [Urine:1500]  Intake/Output this shift: No intake/output data recorded.  Lab Results: Recent Labs    05/14/22 0251 05/15/22 0309  WBC 7.5 6.6  HGB 12.0 12.0  HCT 36.7 36.9  PLT 211 221   BMET Recent Labs    05/13/22 0227 05/14/22 0251 05/15/22 0309  NA 136 136 135  K 4.0 4.1 4.2  CL 102 102 101  CO2 25 25 23   GLUCOSE 82 51* 34*  BUN 7* 8 7*  CREATININE 0.57 0.65 0.69  CALCIUM 8.8* 8.2* 8.8*   LFT Recent Labs    05/13/22 0227 05/14/22 0251 05/15/22 0309  PROT 5.8* 5.2* 5.6*  ALBUMIN 2.7* 2.5* 2.6*  AST 84* 43* 32  ALT 56* 37 30  ALKPHOS 160* 144* 149*  BILITOT 0.7 0.6 0.8  BILIDIR 0.4*  --   --   IBILI 0.3  --   --     PT/INR No results for input(s): "LABPROT", "INR" in the last 72 hours. Hepatitis Panel No results for input(s): "HEPBSAG", "HCVAB", "HEPAIGM", "HEPBIGM" in the last 72 hours.  Studies/Results: MR ABDOMEN MRCP W WO CONTAST  Result Date: 05/14/2022 CLINICAL DATA:  Pancreatitis. EXAM: MRI ABDOMEN WITHOUT AND WITH CONTRAST (INCLUDING MRCP) TECHNIQUE: Multiplanar multisequence MR imaging of the abdomen was performed both before and after the administration of intravenous contrast. Heavily T2-weighted images of the biliary and pancreatic ducts were obtained, and three-dimensional MRCP images were rendered by post processing. CONTRAST:  19mL GADAVIST GADOBUTROL 1 MMOL/ML IV SOLN COMPARISON:  CT 05/13/2022 FINDINGS: Lower chest: Tiny pleural effusions. There is some linear signal at the bases. Atelectasis is favored. This was seen previously. Hepatobiliary: There is no space-occupying liver lesion identified. Moderate central intrahepatic biliary ductal dilatation. The gallbladder is distended. There is some focal thickening of the gallbladder towards the fundus with some cystic components consistent with areas of adenomyomatosis. Subtle gallbladder wall thickening. Common duct has a diameter of 15 mm. Slight tapering towards the pancreatic head. No obvious filling defect along the axial datasets. Somewhat abrupt transition at the ampulla. Pancreas: The pancreatic duct in  the head and neck region is slightly ectatic with diameter of 4 mm. There is a small area of more normal caliber towards the neck and then another areas slight ectasia less of the head towards the proximal body. Duct in the tail is nondilated. Pancreas has some global mild atrophy the abnormal enhancement. The portal venous confluence and splenic vein are patent. No rim enhancing fluid collections. Mild adjacent inflammatory changes. Spleen:  Nonenlarged.  Preserved enhancement and T2 signal. Adrenals/Urinary Tract: Nonspecific thickening of the  adrenal glands. No enhancing renal lesion. Stomach/Bowel: The visualized small and large bowel is nondilated. Once again there is large proximal duodenal diverticula. Vascular/Lymphatic: Normal caliber aorta and IVC. Mild atherosclerotic changes. No developing abnormal lymph node enlargement in the visualized abdomen. Other:  None. Musculoskeletal: Scattered degenerative changes along the spine. There is some compression deformity of the mid lumbar level, L3. This is unchanged from prior CT appears chronic. Slight changes at L1. IMPRESSION: Dilated common bile duct up to 15 mm with some moderate central intrahepatic duct dilatation. No obvious filling defect but slight abrupt transition at the ampulla. There is also a mild-to-moderately ectatic pancreatic duct without significant stricture. Subtle ampullary lesion is not excluded. No separate pancreatic mass. There is some mild peripancreatic stranding. Please correlate with the provided clinical history of pancreatitis. No well-defined fluid collections. Diffuse mild gallbladder wall thickening with focal area with cystic change towards the fundus consistent with a component of adenomyomatosis. Tiny pleural effusions. Electronically Signed   By: Jill Side M.D.   On: 05/14/2022 13:16    CT ABDOMEN PELVIS W CONTRAST  Result Date: 05/13/2022 CLINICAL DATA:  Acute severe pancreatitis. EXAM: CT ABDOMEN AND PELVIS WITH CONTRAST TECHNIQUE: Multidetector CT imaging of the abdomen and pelvis was performed using the standard protocol following bolus administration of intravenous contrast. RADIATION DOSE REDUCTION: This exam was performed according to the departmental dose-optimization program which includes automated exposure control, adjustment of the mA and/or kV according to patient size and/or use of iterative reconstruction technique. CONTRAST:  22mL OMNIPAQUE IOHEXOL 350 MG/ML SOLN COMPARISON:  No prior abdomen pelvis CT. A CTA chest is available for review  from 11/06/2016. CT studies of the pelvis without contrast are also available from 04/03/2022 and 04/19/2018, and also CT lumbar spine 04/03/2022. FINDINGS: Lower chest: Small layering right and trace left pleural effusions. There is mild compressive atelectatic effect in the lower lobes without infiltrates. Chronic mild elevation right hemidiaphragm. Lung bases are otherwise clear. The cardiac size is normal. Symmetric bilateral dense calcifications in both breasts most likely due to fibrocystic disease, alternatively could relate to prior breast implant removal. This was seen previously. Hepatobiliary: There is intrahepatic and extrahepatic biliary dilatation. The intrahepatic biliary dilatation is greatest in the left lobe. The common bile duct measuring up to 1.3 cm. There is no calcified intraductal stone or ductal mass. Etiology could be due to ampullary stricture, spasm or small ampullary mass although no mass is definitively seen. There is mild thickening of the gallbladder free wall and trace pericholecystic fluid. No calcified gallstone is seen. Findings could be congestive etiology, reactive due to adjacent pancreatitis, or acalculous cholecystitis. The liver is normal in size without mass enhancement. Pancreas: There are stranding changes along side the head, uncinate process and neck of the pancreas. Mild pancreatic ductal dilatation greater proximally. No pancreatic mass enhancement is seen. Findings consistent with acute interstitial pancreatitis. There is no peripancreatic hemorrhage, abscess or free air. There is trace nonlocalizing fluid inferior to the  pancreas and posterior to the proximal pancreas. There is no focal pancreatic hypoenhancement concern for pancreatic necrosis. Inflammatory changes extend into the pancreaticoduodenal groove. Spleen: No abnormality. Adrenals/Urinary Tract: No adrenal or renal mass is seen. There is no hydronephrosis. There is contrast in the collecting systems.  Intrarenal stones could be obscured but there is no hydronephrosis no visible ureteral filling defects. The left posterolateral bladder wall is obscured by spray artifact from a left hip replacement. The bladder wall is normal where visible. Stomach/Bowel: Thickened folds are again noted in the stomach similar to 2018, possible chronic gastritis. There is duodenal fold thickening which is probably reactive due to the pancreatitis. There is a 6.4 x 5 by 4.5 cm duodenal diverticulum at the junction of the descending and horizontal sections, with retained contrast. Rest of the small bowel is unremarkable and fills well with contrast. The appendix is normal. There is contrast in the appendix. No focal abnormality of the large bowel wall. Vascular/Lymphatic: Aortic atherosclerosis. No enlarged abdominal or pelvic lymph nodes. Reproductive: Status post hysterectomy. No adnexal masses. There are multiple pelvic phleboliths. Musculoskeletal: There is interval linear sclerotic healing response laterally in both sacral ala with the last pelvic CT showing acute sacral insufficiency fractures. Sacral fracture lines are still partially visible compared with 04/03/2022. There is additional linear healing response in the left pubic bone with left pubic fracture previously noted. No new fracture has become apparent. Chronic compression fractures of L1 and 3 are redemonstrated unchanged. Diffuse osteopenia is greater than expected for a 62 year old. Left hip replacement again noted. Mild degenerative change lumbar spine. Other: Small ascites in the presacral pelvis. No large drainable pocket. No free air, hemorrhage or abscess. There are no incarcerated hernias. There are scattered air pockets in the subcutaneous plane in the abdominal wall most likely reflecting sites of recent injection. Please correlate clinically. IMPRESSION: 1. Acute interstitial proximal pancreatitis without evidence of pancreatic necrosis, hemorrhage or  abscess. 2. There is intrahepatic and extrahepatic biliary dilatation, with the common bile duct measuring up to 1.3 cm. No calcified intraductal stone or mass is seen. Etiology could be due to ampullary stricture, spasm or small ampullary mass. 3. Mild gallbladder wall thickening and trace pericholecystic fluid. No calcified gallstone. Findings could be congestive, reactive due to adjacent pancreatitis, or from acalculous cholecystitis. 4. 6.4 x 5 x 4.5 cm duodenal diverticulum at the junction of the descending and horizontal portions. 5. Aortic atherosclerosis. 6. Small right and trace left pleural effusions. 7. Healing bilateral sacral insufficiency fractures and left pubic bone fracture. 8. Osteopenia and degenerative change. 9. Air pockets in the subcutaneous abdominal wall most likely due to recent injections. Aortic Atherosclerosis (ICD10-I70.0). Electronically Signed   By: Almira Bar M.D.   On: 05/13/2022 21:18    Scheduled Meds:  calcitonin (salmon)  1 spray Alternating Nares Daily   calcium carbonate  1 tablet Oral BID   enoxaparin (LOVENOX) injection  40 mg Subcutaneous Q24H   folic acid  1 mg Oral Daily   levETIRAcetam  500 mg Oral BID   lidocaine  1 patch Transdermal Daily   ondansetron (ZOFRAN) IV  4 mg Intravenous Q6H   senna-docusate  1 tablet Oral BID   thiamine  100 mg Oral Daily   Vitamin D (Ergocalciferol)  50,000 Units Oral Q7 days   Continuous Infusions:  dextrose 5 % and 0.9% NaCl     magnesium sulfate bolus IVPB     PRN Meds:.acetaminophen, bisacodyl, HYDROmorphone (DILAUDID) injection, melatonin, mouth rinse, oxyCODONE,  polyethylene glycol, prochlorperazine   ASSESMENT:     Acute pancreatitis.  CTAP w pancreatitis, no necrosis/fluid collection, dilated CBD, no obvious stone or obstructing mass or stricture.  Large duodenal tic.  Intra/extra hepatic duct dilation.   Mild GB wal thickening and trace peri-cholecystic fluid.   MRCP: 1.5 cm CBD w transition at ampulla  but no CBD stone or masses.  PD ectatic wo signif stricture.  Subtle ampullary mass not excluded.  No mass in pancreas.  Mild peripancreatic stranding.  Diffuse thickening GB was, cystic GB fundus changes s/o adenomyomatosis.   Elevated LFTs resolved except for alk phos that is downtrending.  Marland Kitchen     Recent pelvic fx, non-op mgt.  Osteogenesis imperfecta.      Proteus, E coli UTI.   Rocephin x 5 d, last on 1/15.  Stopped due to possible culprit for pancreatitis.      Hypoglycemia, 34 at 3 AM.  IVF switched to D5NS, current  Rate is 100 mL/hour.      AMS on admit.  Improved.  Currently w signif anxiety.     PLAN     Awaiting opinion of our biliary MD Dr Meridee Score.      Continue supportive care    ?increase IVF rate to 150 mL/hour.      CL diet ordered.  Was NPO x meds.      Jennye Moccasin  05/15/2022, 9:04 AM Phone [phone]

## 2022-05-15 NOTE — Progress Notes (Signed)
Patients diet order updated to clear liquids. Called and set patient on automatic trays. Breakfast tray will be arriving shortly.

## 2022-05-15 NOTE — Progress Notes (Signed)
PROGRESS NOTE    Rose Bridges  FXT:[id number] DOB: 28-Jul-1960 DOA: 05/10/2022 PCP: Cain Saupe, MD   Brief Narrative:  62 y.o. female with medical history significant for polycythemia, osteogenesis imperfecta, osteoporosis, hypertension, hyperlipidemia, seizure disorder, history of falls, recent pelvic fracture after mechanical fall on 03/31/2022 in her Home resulting in pelvic fracture, recently admitted by family medicine residency teaching service on 04/03/22 and discharged on 04/07/22 (Not FM patient, per resident Dr. Wynelle Link) sent from SNF due to recent behavioral change, paranoid and agitation with pressured speech.  On presentation, UA was suggestive of UTI.  Head CT was notable for a new age-indeterminate left cerebellar infarct compared to 2018, no intracranial hemorrhage;  sequela of severe chronic microvascular ischemic change with multiple superimposed basal ganglia infarcts.  WBC 10.6, lactic acid 3.0.  She was started on IV fluids and antibiotics.  Assessment & Plan:   UTI: Present on admission -Completed 5-day course of Rocephin and discontinued on 05/14/2022 urine culture grew more than 100,000 colonies per mL of E. coli and Proteus mirabilis  Acute metabolic encephalopathy Agitation/delirium -Patient presented with behavioral change, catatonia and agitation possibly from UTI. -Mental status has much improved and possibly back to baseline -CT head as above. -Monitor mental status.  Fall precautions.  PT recommends SNF placement.  Social worker following  Possible acute biliary pancreatitis Elevated LFTs -Started having vomiting and severe abdominal pain since 05/12/2022.  Lipase 2337.   -Continue IV fluids and n.p.o. for now.  Lipase 503 05/14/2022.  CT of abdomen and pelvis showed acute pancreatitis without necrosis, hemorrhage or abscess along with intrahepatic and extrahepatic biliary dilatation and mild gallbladder wall thickening and trace pericholecystic fluid.   -GI  following.  MRCP showed dilated CBD up to 15 mm with some moderate central intrahepatic ductal dilatation.   -LFTs much improved.  Monitor  Hypoglycemia -Has had intermittent episodes of hypoglycemia.  Switch IV fluids to D5 NS  Seizure disorder with noncompliance with AEDs -Continue Keppra.  Seizure precautions.  Outpatient follow-up with neurology  Osteogenesis imperfecta -Outpatient follow-up  Polycythemia -Unclear if primary versus secondary.  Hemoglobin much improved after hydration.  Hypomagnesemia -Replace.  Repeat a.m. labs  History of recent pelvic fracture in December 2023 -Managed conservatively -Continue current pain management.  Physical deconditioning -Patient presenting from SNF.  Does not want to go back to the same SNF.  TOC following  DVT prophylaxis: Lovenox Code Status: Full Family Communication: Granddaughter at bedside on 05/12/2022 Disposition Plan: Status is: Inpatient Remains inpatient appropriate because: Of severity of illness.  Need for IV fluids    Consultants: GI  Procedures: None  Antimicrobials: Rocephin from 05/10/2022-05/14/2022  Subjective: Patient seen and examined at bedside.  Still complains of abdominal pain but improving.  Denies worsening chest pain, fever or vomiting.   Objective: Vitals:   05/14/22 0743 05/14/22 1627 05/14/22 2007 05/15/22 0328  BP: 128/71 135/79 (!) 151/73 125/64  Pulse: 94 85 81 78  Resp: 16 16 17 16   Temp: 98.5 F (36.9 C) 98.8 F (37.1 C) 98.8 F (37.1 C) 98.1 F (36.7 C)  TempSrc: Oral Oral Oral Oral  SpO2: 96% 98% 96% 92%  Weight:      Height:        Intake/Output Summary (Last 24 hours) at 05/15/2022 0735 Last data filed at 05/15/2022 0500 Gross per 24 hour  Intake --  Output 1500 ml  Net -1500 ml    Filed Weights   05/10/22 1324  Weight: 59 kg  Examination:  General: No distress currently.  Still on room air.  Looks chronically ill and deconditioned. ENT/neck: No obvious neck  masses or JVD elevation  respiratory: Bilateral decreased breath sounds at bases with some scattered crackles CVS: Rate mostly controlled; S1 and S2 are heard abdominal: Soft, epigastric tenderness still present; distended mildly; no organomegaly, bowel sounds are heard extremities: No cyanosis or clubbing CNS: Alert and oriented; answering questions appropriately.  No focal neurologic deficit.  Able to move extremities lymph: No lymphadenopathy palpable skin: No obvious rashes/petechiae  psych: Flat affect.  Currently not agitated.   Musculoskeletal: No obvious joint deformity/erythema  Data Reviewed: I have personally reviewed following labs and imaging studies  CBC: Recent Labs  Lab 05/10/22 1521 05/10/22 2150 05/11/22 0312 05/14/22 0251 05/15/22 0309  WBC 10.6* 9.8 7.6 7.5 6.6  NEUTROABS 7.2  --   --  4.2 3.7  HGB 17.2* 14.0 12.4 12.0 12.0  HCT 53.1* 42.6 37.4 36.7 36.9  MCV 95.7 95.7 95.9 95.6 96.6  PLT 253 226 211 211 221    Basic Metabolic Panel: Recent Labs  Lab 05/10/22 1521 05/10/22 2150 05/11/22 0312 05/12/22 0249 05/13/22 0227 05/14/22 0251 05/15/22 0309  NA 138  --  142 139 136 136 135  K 4.0  --  4.0 4.4 4.0 4.1 4.2  CL 102  --  110 107 102 102 101  CO2 22  --  25 24 25 25 23   GLUCOSE 161*  --  120* 96 82 51* 34*  BUN 23  --  17 13 7* 8 7*  CREATININE 0.85   < > 0.61 0.57 0.57 0.65 0.69  CALCIUM 9.5  --  7.8* 8.4* 8.8* 8.2* 8.8*  MG 1.8  --  1.5* 1.9 1.5* 1.8 1.4*  PHOS 3.0  --   --   --   --   --   --    < > = values in this interval not displayed.    GFR: Estimated Creatinine Clearance: 63 mL/min (by C-G formula based on SCr of 0.69 mg/dL). Liver Function Tests: Recent Labs  Lab 05/10/22 1521 05/13/22 0227 05/14/22 0251 05/15/22 0309  AST 31 84* 43* 32  ALT 17 56* 37 30  ALKPHOS 135* 160* 144* 149*  BILITOT 0.7 0.7 0.6 0.8  PROT 7.7 5.8* 5.2* 5.6*  ALBUMIN 3.7 2.7* 2.5* 2.6*    Recent Labs  Lab 05/13/22 0227 05/14/22 0251  LIPASE  2,337* 503*    Recent Labs  Lab 05/10/22 1521  AMMONIA 22    Coagulation Profile: No results for input(s): "INR", "PROTIME" in the last 168 hours. Cardiac Enzymes: No results for input(s): "CKTOTAL", "CKMB", "CKMBINDEX", "TROPONINI" in the last 168 hours. BNP (last 3 results) No results for input(s): "PROBNP" in the last 8760 hours. HbA1C: No results for input(s): "HGBA1C" in the last 72 hours. CBG: Recent Labs  Lab 05/15/22 0428 05/15/22 0506  GLUCAP 32* 189*   Lipid Profile: Recent Labs    05/14/22 0251  TRIG 93   Thyroid Function Tests: No results for input(s): "TSH", "T4TOTAL", "FREET4", "T3FREE", "THYROIDAB" in the last 72 hours.  Anemia Panel: No results for input(s): "VITAMINB12", "FOLATE", "FERRITIN", "TIBC", "IRON", "RETICCTPCT" in the last 72 hours.  Sepsis Labs: Recent Labs  Lab 05/10/22 1521 05/10/22 1639  LATICACIDVEN 3.0* 1.6     Recent Results (from the past 240 hour(s))  Urine Culture     Status: Abnormal   Collection Time: 05/10/22  4:39 PM   Specimen: Urine,  Clean Catch  Result Value Ref Range Status   Specimen Description URINE, CLEAN CATCH  Final   Special Requests   Final    NONE Performed at [facility], [address] [place], [place] [address]    Culture (A)  Final    >=100,000 COLONIES/mL ESCHERICHIA COLI >=100,000 COLONIES/mL PROTEUS MIRABILIS    Report Status 05/13/2022 FINAL  Final   Organism ID, Bacteria ESCHERICHIA COLI (A)  Final   Organism ID, Bacteria PROTEUS MIRABILIS (A)  Final      Susceptibility   Escherichia coli - MIC*    AMPICILLIN <=2 SENSITIVE Sensitive     CEFAZOLIN <=4 SENSITIVE Sensitive     CEFEPIME <=0.12 SENSITIVE Sensitive     CEFTRIAXONE <=0.25 SENSITIVE Sensitive     CIPROFLOXACIN <=0.25 SENSITIVE Sensitive     GENTAMICIN <=1 SENSITIVE Sensitive     IMIPENEM <=0.25 SENSITIVE Sensitive     NITROFURANTOIN <=16 SENSITIVE Sensitive     TRIMETH/SULFA <=20 SENSITIVE Sensitive      AMPICILLIN/SULBACTAM <=2 SENSITIVE Sensitive     PIP/TAZO <=4 SENSITIVE Sensitive     * >=100,000 COLONIES/mL ESCHERICHIA COLI   Proteus mirabilis - MIC*    AMPICILLIN <=2 SENSITIVE Sensitive     CEFAZOLIN <=4 SENSITIVE Sensitive     CEFEPIME <=0.12 SENSITIVE Sensitive     CEFTRIAXONE <=0.25 SENSITIVE Sensitive     CIPROFLOXACIN <=0.25 SENSITIVE Sensitive     GENTAMICIN <=1 SENSITIVE Sensitive     IMIPENEM 1 SENSITIVE Sensitive     NITROFURANTOIN 128 RESISTANT Resistant     TRIMETH/SULFA <=20 SENSITIVE Sensitive     AMPICILLIN/SULBACTAM <=2 SENSITIVE Sensitive     PIP/TAZO <=4 SENSITIVE Sensitive     * >=100,000 COLONIES/mL PROTEUS MIRABILIS         Radiology Studies: MR ABDOMEN MRCP W WO CONTAST  Result Date: 05/14/2022 CLINICAL DATA:  Pancreatitis. EXAM: MRI ABDOMEN WITHOUT AND WITH CONTRAST (INCLUDING MRCP) TECHNIQUE: Multiplanar multisequence MR imaging of the abdomen was performed both before and after the administration of intravenous contrast. Heavily T2-weighted images of the biliary and pancreatic ducts were obtained, and three-dimensional MRCP images were rendered by post processing. CONTRAST:  43mL GADAVIST GADOBUTROL 1 MMOL/ML IV SOLN COMPARISON:  CT 05/13/2022 FINDINGS: Lower chest: Tiny pleural effusions. There is some linear signal at the bases. Atelectasis is favored. This was seen previously. Hepatobiliary: There is no space-occupying liver lesion identified. Moderate central intrahepatic biliary ductal dilatation. The gallbladder is distended. There is some focal thickening of the gallbladder towards the fundus with some cystic components consistent with areas of adenomyomatosis. Subtle gallbladder wall thickening. Common duct has a diameter of 15 mm. Slight tapering towards the pancreatic head. No obvious filling defect along the axial datasets. Somewhat abrupt transition at the ampulla. Pancreas: The pancreatic duct in the head and neck region is slightly ectatic with  diameter of 4 mm. There is a small area of more normal caliber towards the neck and then another areas slight ectasia less of the head towards the proximal body. Duct in the tail is nondilated. Pancreas has some global mild atrophy the abnormal enhancement. The portal venous confluence and splenic vein are patent. No rim enhancing fluid collections. Mild adjacent inflammatory changes. Spleen:  Nonenlarged.  Preserved enhancement and T2 signal. Adrenals/Urinary Tract: Nonspecific thickening of the adrenal glands. No enhancing renal lesion. Stomach/Bowel: The visualized small and large bowel is nondilated. Once again there is large proximal duodenal diverticula. Vascular/Lymphatic: Normal caliber aorta and IVC. Mild atherosclerotic changes.  No developing abnormal lymph node enlargement in the visualized abdomen. Other:  None. Musculoskeletal: Scattered degenerative changes along the spine. There is some compression deformity of the mid lumbar level, L3. This is unchanged from prior CT appears chronic. Slight changes at L1. IMPRESSION: Dilated common bile duct up to 15 mm with some moderate central intrahepatic duct dilatation. No obvious filling defect but slight abrupt transition at the ampulla. There is also a mild-to-moderately ectatic pancreatic duct without significant stricture. Subtle ampullary lesion is not excluded. No separate pancreatic mass. There is some mild peripancreatic stranding. Please correlate with the provided clinical history of pancreatitis. No well-defined fluid collections. Diffuse mild gallbladder wall thickening with focal area with cystic change towards the fundus consistent with a component of adenomyomatosis. Tiny pleural effusions. Electronically Signed   By: Ashok  GupKaren Kaysta M.D.   On: 05/14/2022 13:16   MR 3D Recon At Scanner  Result Date: 05/14/2022 CLINICAL DATA:  Pancreatitis. EXAM: MRI ABDOMEN WITHOUT AND WITH CONTRAST (INCLUDING MRCP) TECHNIQUE: Multiplanar multisequence MR  imaging of the abdomen was performed both before and after the administration of intravenous contrast. Heavily T2-weighted images of the biliary and pancreatic ducts were obtained, and three-dimensional MRCP images were rendered by post processing. CONTRAST:  6mL GADAVIST GADOBUTROL 1 MMOL/ML IV SOLN COMPARISON:  CT 05/13/2022 FINDINGS: Lower chest: Tiny pleural effusions. There is some linear signal at the bases. Atelectasis is favored. This was seen previously. Hepatobiliary: There is no space-occupying liver lesion identified. Moderate central intrahepatic biliary ductal dilatation. The gallbladder is distended. There is some focal thickening of the gallbladder towards the fundus with some cystic components consistent with areas of adenomyomatosis. Subtle gallbladder wall thickening. Common duct has a diameter of 15 mm. Slight tapering towards the pancreatic head. No obvious filling defect along the axial datasets. Somewhat abrupt transition at the ampulla. Pancreas: The pancreatic duct in the head and neck region is slightly ectatic with diameter of 4 mm. There is a small area of more normal caliber towards the neck and then another areas slight ectasia less of the head towards the proximal body. Duct in the tail is nondilated. Pancreas has some global mild atrophy the abnormal enhancement. The portal venous confluence and splenic vein are patent. No rim enhancing fluid collections. Mild adjacent inflammatory changes. Spleen:  Nonenlarged.  Preserved enhancement and T2 signal. Adrenals/Urinary Tract: Nonspecific thickening of the adrenal glands. No enhancing renal lesion. Stomach/Bowel: The visualized small and large bowel is nondilated. Once again there is large proximal duodenal diverticula. Vascular/Lymphatic: Normal caliber aorta and IVC. Mild atherosclerotic changes. No developing abnormal lymph node enlargement in the visualized abdomen. Other:  None. Musculoskeletal: Scattered degenerative changes along  the spine. There is some compression deformity of the mid lumbar level, L3. This is unchanged from prior CT appears chronic. Slight changes at L1. IMPRESSION: Dilated common bile duct up to 15 mm with some moderate central intrahepatic duct dilatation. No obvious filling defect but slight abrupt transition at the ampulla. There is also a mild-to-moderately ectatic pancreatic duct without significant stricture. Subtle ampullary lesion is not excluded. No separate pancreatic mass. There is some mild peripancreatic stranding. Please correlate with the provided clinical history of pancreatitis. No well-defined fluid collections. Diffuse mild gallbladder wall thickening with focal area with cystic change towards the fundus consistent with a component of adenomyomatosis. Tiny pleural effusions. Electronically Signed   By: Karen KaysAshok  Gupta M.D.   On: 05/14/2022 13:16   CT ABDOMEN PELVIS W CONTRAST  Result Date: 05/13/2022 CLINICAL DATA:  Acute severe pancreatitis. EXAM: CT ABDOMEN AND PELVIS WITH CONTRAST TECHNIQUE: Multidetector CT imaging of the abdomen and pelvis was performed using the standard protocol following bolus administration of intravenous contrast. RADIATION DOSE REDUCTION: This exam was performed according to the departmental dose-optimization program which includes automated exposure control, adjustment of the mA and/or kV according to patient size and/or use of iterative reconstruction technique. CONTRAST:  44mL OMNIPAQUE IOHEXOL 350 MG/ML SOLN COMPARISON:  No prior abdomen pelvis CT. A CTA chest is available for review from 11/06/2016. CT studies of the pelvis without contrast are also available from 04/03/2022 and 04/19/2018, and also CT lumbar spine 04/03/2022. FINDINGS: Lower chest: Small layering right and trace left pleural effusions. There is mild compressive atelectatic effect in the lower lobes without infiltrates. Chronic mild elevation right hemidiaphragm. Lung bases are otherwise clear. The  cardiac size is normal. Symmetric bilateral dense calcifications in both breasts most likely due to fibrocystic disease, alternatively could relate to prior breast implant removal. This was seen previously. Hepatobiliary: There is intrahepatic and extrahepatic biliary dilatation. The intrahepatic biliary dilatation is greatest in the left lobe. The common bile duct measuring up to 1.3 cm. There is no calcified intraductal stone or ductal mass. Etiology could be due to ampullary stricture, spasm or small ampullary mass although no mass is definitively seen. There is mild thickening of the gallbladder free wall and trace pericholecystic fluid. No calcified gallstone is seen. Findings could be congestive etiology, reactive due to adjacent pancreatitis, or acalculous cholecystitis. The liver is normal in size without mass enhancement. Pancreas: There are stranding changes along side the head, uncinate process and neck of the pancreas. Mild pancreatic ductal dilatation greater proximally. No pancreatic mass enhancement is seen. Findings consistent with acute interstitial pancreatitis. There is no peripancreatic hemorrhage, abscess or free air. There is trace nonlocalizing fluid inferior to the pancreas and posterior to the proximal pancreas. There is no focal pancreatic hypoenhancement concern for pancreatic necrosis. Inflammatory changes extend into the pancreaticoduodenal groove. Spleen: No abnormality. Adrenals/Urinary Tract: No adrenal or renal mass is seen. There is no hydronephrosis. There is contrast in the collecting systems. Intrarenal stones could be obscured but there is no hydronephrosis no visible ureteral filling defects. The left posterolateral bladder wall is obscured by spray artifact from a left hip replacement. The bladder wall is normal where visible. Stomach/Bowel: Thickened folds are again noted in the stomach similar to 2018, possible chronic gastritis. There is duodenal fold thickening which is  probably reactive due to the pancreatitis. There is a 6.4 x 5 by 4.5 cm duodenal diverticulum at the junction of the descending and horizontal sections, with retained contrast. Rest of the small bowel is unremarkable and fills well with contrast. The appendix is normal. There is contrast in the appendix. No focal abnormality of the large bowel wall. Vascular/Lymphatic: Aortic atherosclerosis. No enlarged abdominal or pelvic lymph nodes. Reproductive: Status post hysterectomy. No adnexal masses. There are multiple pelvic phleboliths. Musculoskeletal: There is interval linear sclerotic healing response laterally in both sacral ala with the last pelvic CT showing acute sacral insufficiency fractures. Sacral fracture lines are still partially visible compared with 04/03/2022. There is additional linear healing response in the left pubic bone with left pubic fracture previously noted. No new fracture has become apparent. Chronic compression fractures of L1 and 3 are redemonstrated unchanged. Diffuse osteopenia is greater than expected for a 62 year old. Left hip replacement again noted. Mild degenerative change lumbar spine. Other: Small ascites in the presacral pelvis. No large  drainable pocket. No free air, hemorrhage or abscess. There are no incarcerated hernias. There are scattered air pockets in the subcutaneous plane in the abdominal wall most likely reflecting sites of recent injection. Please correlate clinically. IMPRESSION: 1. Acute interstitial proximal pancreatitis without evidence of pancreatic necrosis, hemorrhage or abscess. 2. There is intrahepatic and extrahepatic biliary dilatation, with the common bile duct measuring up to 1.3 cm. No calcified intraductal stone or mass is seen. Etiology could be due to ampullary stricture, spasm or small ampullary mass. 3. Mild gallbladder wall thickening and trace pericholecystic fluid. No calcified gallstone. Findings could be congestive, reactive due to adjacent  pancreatitis, or from acalculous cholecystitis. 4. 6.4 x 5 x 4.5 cm duodenal diverticulum at the junction of the descending and horizontal portions. 5. Aortic atherosclerosis. 6. Small right and trace left pleural effusions. 7. Healing bilateral sacral insufficiency fractures and left pubic bone fracture. 8. Osteopenia and degenerative change. 9. Air pockets in the subcutaneous abdominal wall most likely due to recent injections. Aortic Atherosclerosis (ICD10-I70.0). Electronically Signed   By: Telford Nab M.D.   On: 05/13/2022 21:18        Scheduled Meds:  calcitonin (salmon)  1 spray Alternating Nares Daily   calcium carbonate  1 tablet Oral BID   enoxaparin (LOVENOX) injection  40 mg Subcutaneous G86P   folic acid  1 mg Oral Daily   levETIRAcetam  500 mg Oral BID   lidocaine  1 patch Transdermal Daily   ondansetron (ZOFRAN) IV  4 mg Intravenous Q6H   senna-docusate  1 tablet Oral BID   thiamine  100 mg Oral Daily   Vitamin D (Ergocalciferol)  50,000 Units Oral Q7 days   Continuous Infusions:  sodium chloride 125 mL/hr at 05/15/22 6195          Aline August, MD [facility] 05/15/2022, 7:35 AM

## 2022-05-16 DIAGNOSIS — K859 Acute pancreatitis without necrosis or infection, unspecified: Secondary | ICD-10-CM | POA: Diagnosis not present

## 2022-05-16 DIAGNOSIS — R932 Abnormal findings on diagnostic imaging of liver and biliary tract: Secondary | ICD-10-CM | POA: Diagnosis not present

## 2022-05-16 LAB — CBC WITH DIFFERENTIAL/PLATELET
Abs Immature Granulocytes: 0.02 10*3/uL (ref 0.00–0.07)
Basophils Absolute: 0 10*3/uL (ref 0.0–0.1)
Basophils Relative: 0 %
Eosinophils Absolute: 0.5 10*3/uL (ref 0.0–0.5)
Eosinophils Relative: 7 %
HCT: 36.2 % (ref 36.0–46.0)
Hemoglobin: 12.2 g/dL (ref 12.0–15.0)
Immature Granulocytes: 0 %
Lymphocytes Relative: 38 %
Lymphs Abs: 2.5 10*3/uL (ref 0.7–4.0)
MCH: 31.5 pg (ref 26.0–34.0)
MCHC: 33.7 g/dL (ref 30.0–36.0)
MCV: 93.5 fL (ref 80.0–100.0)
Monocytes Absolute: 0.6 10*3/uL (ref 0.1–1.0)
Monocytes Relative: 9 %
Neutro Abs: 3 10*3/uL (ref 1.7–7.7)
Neutrophils Relative %: 46 %
Platelets: 238 10*3/uL (ref 150–400)
RBC: 3.87 MIL/uL (ref 3.87–5.11)
RDW: 12.5 % (ref 11.5–15.5)
WBC: 6.7 10*3/uL (ref 4.0–10.5)
nRBC: 0 % (ref 0.0–0.2)

## 2022-05-16 LAB — COMPREHENSIVE METABOLIC PANEL
ALT: 24 U/L (ref 0–44)
AST: 26 U/L (ref 15–41)
Albumin: 2.5 g/dL — ABNORMAL LOW (ref 3.5–5.0)
Alkaline Phosphatase: 128 U/L — ABNORMAL HIGH (ref 38–126)
Anion gap: 8 (ref 5–15)
BUN: 5 mg/dL — ABNORMAL LOW (ref 8–23)
CO2: 26 mmol/L (ref 22–32)
Calcium: 8.6 mg/dL — ABNORMAL LOW (ref 8.9–10.3)
Chloride: 102 mmol/L (ref 98–111)
Creatinine, Ser: 0.6 mg/dL (ref 0.44–1.00)
GFR, Estimated: >60 mL/min (ref >60)
Glucose, Bld: 116 mg/dL — ABNORMAL HIGH (ref 70–99)
Potassium: 3.3 mmol/L — ABNORMAL LOW (ref 3.5–5.1)
Sodium: 136 mmol/L (ref 135–145)
Total Bilirubin: 0.6 mg/dL (ref 0.3–1.2)
Total Protein: 5.3 g/dL — ABNORMAL LOW (ref 6.5–8.1)

## 2022-05-16 LAB — GLUCOSE, CAPILLARY
Glucose-Capillary: 100 mg/dL — ABNORMAL HIGH (ref 70–99)
Glucose-Capillary: 122 mg/dL — ABNORMAL HIGH (ref 70–99)
Glucose-Capillary: 127 mg/dL — ABNORMAL HIGH (ref 70–99)
Glucose-Capillary: 128 mg/dL — ABNORMAL HIGH (ref 70–99)
Glucose-Capillary: 153 mg/dL — ABNORMAL HIGH (ref 70–99)
Glucose-Capillary: 177 mg/dL — ABNORMAL HIGH (ref 70–99)

## 2022-05-16 LAB — MAGNESIUM: Magnesium: 1.5 mg/dL — ABNORMAL LOW (ref 1.7–2.4)

## 2022-05-16 MED ORDER — POTASSIUM CHLORIDE CRYS ER 20 MEQ PO TBCR
40.0000 meq | EXTENDED_RELEASE_TABLET | Freq: Once | ORAL | Status: AC
  Administered 2022-05-16: 40 meq via ORAL
  Filled 2022-05-16: qty 2

## 2022-05-16 MED ORDER — MAGNESIUM SULFATE 2 GM/50ML IV SOLN
2.0000 g | Freq: Once | INTRAVENOUS | Status: AC
  Administered 2022-05-16: 2 g via INTRAVENOUS
  Filled 2022-05-16: qty 50

## 2022-05-16 NOTE — Progress Notes (Signed)
Secretary asked to order patient ted hose.

## 2022-05-16 NOTE — Progress Notes (Signed)
Morning meds given. Pt stated that she is currently not having any pain. Ice water also given per request. All needs met at this time. Will continue to monitor pt.

## 2022-05-16 NOTE — Progress Notes (Signed)
Progress Note   Subjective  Patient doing okay this AM, her mood appears better. She still is having intermittent epigastric pain but she tolerated clear liquid diet well and does have an appetite for some solids.  Her pain overall is improved compared to the weekend when it started although rates it about 4-5 out of 10 today.   Objective   Vital signs in last 24 hours: Temp:  [98.1 F (36.7 C)-98.5 F (36.9 C)] 98.1 F (36.7 C) (01/17 0735) Pulse Rate:  [69-78] 77 (01/17 0735) Resp:  [16-18] 16 (01/17 0735) BP: (126-145)/(72-82) 131/72 (01/17 0735) SpO2:  [94 %-97 %] 97 % (01/17 0735) Last BM Date : 05/15/22 General:    white female in NAD Neurologic:  Alert and oriented,  grossly normal neurologically. Psych:  Cooperative. Normal mood and affect.  Intake/Output from previous day: 01/16 0701 - 01/17 0700 In: 357.8 [P.O.:120; I.V.:237.8] Out: 1600 [Urine:1600] Intake/Output this shift: No intake/output data recorded.  Lab Results: Recent Labs    05/14/22 0251 05/15/22 0309 05/16/22 0320  WBC 7.5 6.6 6.7  HGB 12.0 12.0 12.2  HCT 36.7 36.9 36.2  PLT 211 221 238   BMET Recent Labs    05/14/22 0251 05/15/22 0309 05/16/22 0320  NA 136 135 136  K 4.1 4.2 3.3*  CL 102 101 102  CO2 25 23 26   GLUCOSE 51* 34* 116*  BUN 8 7* 5*  CREATININE 0.65 0.69 0.60  CALCIUM 8.2* 8.8* 8.6*   LFT Recent Labs    05/16/22 0320  PROT 5.3*  ALBUMIN 2.5*  AST 26  ALT 24  ALKPHOS 128*  BILITOT 0.6   PT/INR No results for input(s): "LABPROT", "INR" in the last 72 hours.  Studies/Results: MR ABDOMEN MRCP W WO CONTAST  Result Date: 05/14/2022 CLINICAL DATA:  Pancreatitis. EXAM: MRI ABDOMEN WITHOUT AND WITH CONTRAST (INCLUDING MRCP) TECHNIQUE: Multiplanar multisequence MR imaging of the abdomen was performed both before and after the administration of intravenous contrast. Heavily T2-weighted images of the biliary and pancreatic ducts were obtained, and three-dimensional  MRCP images were rendered by post processing. CONTRAST:  98mL GADAVIST GADOBUTROL 1 MMOL/ML IV SOLN COMPARISON:  CT 05/13/2022 FINDINGS: Lower chest: Tiny pleural effusions. There is some linear signal at the bases. Atelectasis is favored. This was seen previously. Hepatobiliary: There is no space-occupying liver lesion identified. Moderate central intrahepatic biliary ductal dilatation. The gallbladder is distended. There is some focal thickening of the gallbladder towards the fundus with some cystic components consistent with areas of adenomyomatosis. Subtle gallbladder wall thickening. Common duct has a diameter of 15 mm. Slight tapering towards the pancreatic head. No obvious filling defect along the axial datasets. Somewhat abrupt transition at the ampulla. Pancreas: The pancreatic duct in the head and neck region is slightly ectatic with diameter of 4 mm. There is a small area of more normal caliber towards the neck and then another areas slight ectasia less of the head towards the proximal body. Duct in the tail is nondilated. Pancreas has some global mild atrophy the abnormal enhancement. The portal venous confluence and splenic vein are patent. No rim enhancing fluid collections. Mild adjacent inflammatory changes. Spleen:  Nonenlarged.  Preserved enhancement and T2 signal. Adrenals/Urinary Tract: Nonspecific thickening of the adrenal glands. No enhancing renal lesion. Stomach/Bowel: The visualized small and large bowel is nondilated. Once again there is large proximal duodenal diverticula. Vascular/Lymphatic: Normal caliber aorta and IVC. Mild atherosclerotic changes. No developing abnormal lymph node enlargement in the  visualized abdomen. Other:  None. Musculoskeletal: Scattered degenerative changes along the spine. There is some compression deformity of the mid lumbar level, L3. This is unchanged from prior CT appears chronic. Slight changes at L1. IMPRESSION: Dilated common bile duct up to 15 mm with  some moderate central intrahepatic duct dilatation. No obvious filling defect but slight abrupt transition at the ampulla. There is also a mild-to-moderately ectatic pancreatic duct without significant stricture. Subtle ampullary lesion is not excluded. No separate pancreatic mass. There is some mild peripancreatic stranding. Please correlate with the provided clinical history of pancreatitis. No well-defined fluid collections. Diffuse mild gallbladder wall thickening with focal area with cystic change towards the fundus consistent with a component of adenomyomatosis. Tiny pleural effusions. Electronically Signed   By: Jill Side M.D.   On: 05/14/2022 13:16   MR 3D Recon At Scanner  Result Date: 05/14/2022 CLINICAL DATA:  Pancreatitis. EXAM: MRI ABDOMEN WITHOUT AND WITH CONTRAST (INCLUDING MRCP) TECHNIQUE: Multiplanar multisequence MR imaging of the abdomen was performed both before and after the administration of intravenous contrast. Heavily T2-weighted images of the biliary and pancreatic ducts were obtained, and three-dimensional MRCP images were rendered by post processing. CONTRAST:  40mL GADAVIST GADOBUTROL 1 MMOL/ML IV SOLN COMPARISON:  CT 05/13/2022 FINDINGS: Lower chest: Tiny pleural effusions. There is some linear signal at the bases. Atelectasis is favored. This was seen previously. Hepatobiliary: There is no space-occupying liver lesion identified. Moderate central intrahepatic biliary ductal dilatation. The gallbladder is distended. There is some focal thickening of the gallbladder towards the fundus with some cystic components consistent with areas of adenomyomatosis. Subtle gallbladder wall thickening. Common duct has a diameter of 15 mm. Slight tapering towards the pancreatic head. No obvious filling defect along the axial datasets. Somewhat abrupt transition at the ampulla. Pancreas: The pancreatic duct in the head and neck region is slightly ectatic with diameter of 4 mm. There is a small  area of more normal caliber towards the neck and then another areas slight ectasia less of the head towards the proximal body. Duct in the tail is nondilated. Pancreas has some global mild atrophy the abnormal enhancement. The portal venous confluence and splenic vein are patent. No rim enhancing fluid collections. Mild adjacent inflammatory changes. Spleen:  Nonenlarged.  Preserved enhancement and T2 signal. Adrenals/Urinary Tract: Nonspecific thickening of the adrenal glands. No enhancing renal lesion. Stomach/Bowel: The visualized small and large bowel is nondilated. Once again there is large proximal duodenal diverticula. Vascular/Lymphatic: Normal caliber aorta and IVC. Mild atherosclerotic changes. No developing abnormal lymph node enlargement in the visualized abdomen. Other:  None. Musculoskeletal: Scattered degenerative changes along the spine. There is some compression deformity of the mid lumbar level, L3. This is unchanged from prior CT appears chronic. Slight changes at L1. IMPRESSION: Dilated common bile duct up to 15 mm with some moderate central intrahepatic duct dilatation. No obvious filling defect but slight abrupt transition at the ampulla. There is also a mild-to-moderately ectatic pancreatic duct without significant stricture. Subtle ampullary lesion is not excluded. No separate pancreatic mass. There is some mild peripancreatic stranding. Please correlate with the provided clinical history of pancreatitis. No well-defined fluid collections. Diffuse mild gallbladder wall thickening with focal area with cystic change towards the fundus consistent with a component of adenomyomatosis. Tiny pleural effusions. Electronically Signed   By: Jill Side M.D.   On: 05/14/2022 13:16       Assessment / Plan:    62 year old female here for the following:  Acute pancreatitis  Dilated bile duct /abnormal biliary imaging Recent UTI treated with Rocephin, stopped on 1/15 Recent pelvic fracture  nonoperative management - osteogenesis imperfecta  Labs are stable.  She is improved from when this first started but still having some intermittent pain although she appears much more comfortable on interview today and her mood appears better.  I have reviewed her imaging studies with her and advanced endoscopy, Dr. Meridee Score.  Unclear what has driven her pancreatitis at this point.  She has biliary ductal dilation on imaging but unclear chronicity of this and if related to her pancreatitis or not.  She only had very mild elevation in her liver enzymes.  She has a very large duodenal diverticulum which can rarely be associated with pancreatitis however in her case perhaps this appears a bit further distal in her bowel to be related to this.  Another potential etiology would be ceftriaxone which has been reported to cause pancreatitis, she is no longer on this.  We discussed again that she warrants an EUS to further evaluate her biliary tree and rule out an ampullary lesion, the question is the timing of this.  Ideally we would want more time for her pancreatitis to improve and do this as an outpatient in a few weeks.  She understands this and I do think she is slowly improving.  She is hungry, we will give her a low-fat diet, she will go slow with this and see how she does. If she fails to improve to the point where we can discharge in upcoming days, may consider endoscopic evaluation of the ampulla if full EUS not available this week.  PLAN: - advance diet slowly - low fat - patient warrants EUS as above - ideally would do this outpatient in a few weeks if she otherwise continues to improve - will follow peripherally next day or so, call with questions. Will check back on her later this week.  Harlin Rain, MD [facility]

## 2022-05-16 NOTE — Progress Notes (Signed)
PROGRESS NOTE  Rose Bridges DGL:[id number] DOB: 07/01/1960 DOA: 05/10/2022 PCP: Cain Saupe, MD   LOS: 6 days   Brief Narrative / Interim history: 62 y.o. female with medical history significant for polycythemia, osteogenesis imperfecta, osteoporosis, hypertension, hyperlipidemia, seizure disorder, history of falls, recent pelvic fracture after mechanical fall on 03/31/2022 in her Home resulting in pelvic fracture, recently admitted by family medicine residency teaching service on 04/03/22 and discharged on 04/07/22 (Not FM patient, per resident Dr. Wynelle Link) sent from SNF due to recent behavioral change, paranoid and agitation with pressured speech.  On presentation, UA was suggestive of UTI.  Head CT was notable for a new age-indeterminate left cerebellar infarct compared to 2018, no intracranial hemorrhage;  sequela of severe chronic microvascular ischemic change with multiple superimposed basal ganglia infarcts.  WBC 10.6, lactic acid 3.0.  She was started on IV fluids and antibiotics.   Subjective / 24h Interval events: Continues to complain of persistent abdominal pain, although much improved than when she first came in.  Feels like her stomach is growling  Assesement and Plan: Principal Problem:   AMS (altered mental status) Active Problems:   Acute pancreatitis without infection or necrosis   Abnormal findings on imaging of biliary tract   Principal problem UTI- Present on admission, s/p 5-day course of Rocephin and discontinued on 05/14/2022 urine culture grew more than 100,000 colonies per mL of pansensitive E. coli and Proteus mirabilis   Acute metabolic encephalopathy, agitation/delirium - Patient presented with behavioral change, catatonia and agitation possibly from UTI. Mental status has much improved and now back to baseline Acute pancreatitis, Elevated LFTs -Started having vomiting and severe abdominal pain since 05/12/2022.  Lipase 2337.  GI consulted, appreciate follow-up.  She  has improved abdominal pain overall, advance diet per GI.  Etiology is not entirely clear, she will need a EUS to further evaluate her CBD dilatation but this will be done as an outpatient if she continues to improve.  She also has a very large duodenal diverticulum which can be associated with pancreatitis but appears a bit further distal in her bowel to be related to this.  Another etiology could be ceftriaxone Hypoglycemia -Has had intermittent episodes of hypoglycemia.  Now diet is advanced Seizure disorder with noncompliance with AEDs -Continue Keppra.  Seizure precautions.  Outpatient follow-up with neurology Osteogenesis imperfecta -Outpatient follow-up Hypomagnesemia -Replace.  Repeat a.m. labs History of recent pelvic fracture in December 2023 -Managed conservatively Physical deconditioning -Patient presenting from SNF, return to SNF   Scheduled Meds:  calcitonin (salmon)  1 spray Alternating Nares Daily   calcium carbonate  1 tablet Oral BID   enoxaparin (LOVENOX) injection  40 mg Subcutaneous Q24H   folic acid  1 mg Oral Daily   levETIRAcetam  500 mg Oral BID   lidocaine  1 patch Transdermal Daily   ondansetron (ZOFRAN) IV  4 mg Intravenous Q6H   senna-docusate  1 tablet Oral BID   thiamine  100 mg Oral Daily   Vitamin D (Ergocalciferol)  50,000 Units Oral Q7 days   Continuous Infusions:  dextrose 5 % and 0.9% NaCl 100 mL/hr at 05/15/22 2351   PRN Meds:.acetaminophen, bisacodyl, HYDROmorphone (DILAUDID) injection, melatonin, mouth rinse, oxyCODONE, polyethylene glycol, prochlorperazine  Current Outpatient Medications  Medication Instructions   acetaminophen (TYLENOL) 650 mg, Oral, Every 6 hours   acetaminophen (TYLENOL) 1,000 mg, Oral, 3 times daily   Baclofen 5 MG TABS Take 1 tablet by mouth every 8 (eight) hours as needed for muscle spasms.  Baclofen 10 mg, Oral, 2 times daily   calcitonin, salmon, (MIACALCIN/FORTICAL) 200 UNIT/ACT nasal spray 1 spray, Alternating Nares,  Daily   calcium carbonate (TUMS - DOSED IN MG ELEMENTAL CALCIUM) 500 MG chewable tablet 1 tablet, Oral, 2 times daily   folic acid (FOLVITE) 1 mg, Oral, Daily   oxyCODONE (OXY IR/ROXICODONE) 5 mg, Oral, Every 4 hours PRN   thiamine (VITAMIN B1) 100 mg, Oral, Daily   Vitamin D (Ergocalciferol) (DRISDOL) 50,000 Units, Oral, Every 7 days    Diet Orders (From admission, onward)     Start     Ordered   05/16/22 0925  Diet Heart Room service appropriate? Yes; Fluid consistency: Thin  Diet effective now       Question Answer Comment  Room service appropriate? Yes   Fluid consistency: Thin      05/16/22 0924            DVT prophylaxis: Place TED hose Start: 05/16/22 0747 enoxaparin (LOVENOX) injection 40 mg Start: 05/10/22 2200   Lab Results  Component Value Date   PLT 238 05/16/2022      Code Status: Full Code  Family Communication: no family at bedside   Status is: Inpatient  Remains inpatient appropriate because: severity of illness  Level of care: Med-Surg  Consultants:  GI  Objective: Vitals:   05/15/22 1630 05/15/22 2024 05/16/22 0445 05/16/22 0735  BP: (!) 145/82 (!) 145/81 126/72 131/72  Pulse: 78 69 70 77  Resp: 18 18 18 16   Temp: 98.5 F (36.9 C) 98.2 F (36.8 C) 98.2 F (36.8 C) 98.1 F (36.7 C)  TempSrc: Oral Oral Oral Oral  SpO2: 94% 97% 94% 97%  Weight:      Height:        Intake/Output Summary (Last 24 hours) at 05/16/2022 1035 Last data filed at 05/15/2022 1627 Gross per 24 hour  Intake 357.81 ml  Output 800 ml  Net -442.19 ml   Wt Readings from Last 3 Encounters:  05/10/22 59 kg  04/03/22 58 kg  11/15/19 58.3 kg    Examination:  Constitutional: NAD Eyes: no scleral icterus ENMT: Mucous membranes are moist.  Neck: normal, supple Respiratory: clear to auscultation bilaterally, no wheezing, no crackles. Normal respiratory effort. No accessory muscle use.  Cardiovascular: Regular rate and rhythm, no murmurs / rubs / gallops. No LE  edema.  Abdomen: non distended, no tenderness. Bowel sounds positive.  Musculoskeletal: no clubbing / cyanosis.  Skin: no rashes   Data Reviewed: I have independently reviewed following labs and imaging studies   CBC Recent Labs  Lab 05/10/22 1521 05/10/22 2150 05/11/22 0312 05/14/22 0251 05/15/22 0309 05/16/22 0320  WBC 10.6* 9.8 7.6 7.5 6.6 6.7  HGB 17.2* 14.0 12.4 12.0 12.0 12.2  HCT 53.1* 42.6 37.4 36.7 36.9 36.2  PLT 253 226 211 211 221 238  MCV 95.7 95.7 95.9 95.6 96.6 93.5  MCH 31.0 31.5 31.8 31.3 31.4 31.5  MCHC 32.4 32.9 33.2 32.7 32.5 33.7  RDW 12.8 13.0 13.1 12.7 12.6 12.5  LYMPHSABS 2.4  --   --  2.5 2.0 2.5  MONOABS 0.9  --   --  0.4 0.4 0.6  EOSABS 0.1  --   --  0.4 0.5 0.5  BASOSABS 0.0  --   --  0.0 0.0 0.0    Recent Labs  Lab 05/10/22 1521 05/10/22 1639 05/10/22 2150 05/11/22 0312 05/12/22 0249 05/13/22 0227 05/14/22 0251 05/15/22 0309 05/16/22 0320  NA 138  --   --    < >  139 136 136 135 136  K 4.0  --   --    < > 4.4 4.0 4.1 4.2 3.3*  CL 102  --   --    < > 107 102 102 101 102  CO2 22  --   --    < > 24 25 25 23 26   GLUCOSE 161*  --   --    < > 96 82 51* 34* 116*  BUN 23  --   --    < > 13 7* 8 7* 5*  CREATININE 0.85  --  0.80   < > 0.57 0.57 0.65 0.69 0.60  CALCIUM 9.5  --   --    < > 8.4* 8.8* 8.2* 8.8* 8.6*  AST 31  --   --   --   --  84* 43* 32 26  ALT 17  --   --   --   --  56* 37 30 24  ALKPHOS 135*  --   --   --   --  160* 144* 149* 128*  BILITOT 0.7  --   --   --   --  0.7 0.6 0.8 0.6  ALBUMIN 3.7  --   --   --   --  2.7* 2.5* 2.6* 2.5*  MG 1.8  --   --    < > 1.9 1.5* 1.8 1.4* 1.5*  LATICACIDVEN 3.0* 1.6  --   --   --   --   --   --   --   TSH  --   --  1.152  --   --   --   --   --   --   AMMONIA 22  --   --   --   --   --   --   --   --    < > = values in this interval not displayed.    ------------------------------------------------------------------------------------------------------------------ Recent Labs     05/14/22 0251  TRIG 93    Lab Results  Component Value Date   HGBA1C 5.8 (H) 09/25/2015   ------------------------------------------------------------------------------------------------------------------ No results for input(s): "TSH", "T4TOTAL", "T3FREE", "THYROIDAB" in the last 72 hours.  Invalid input(s): "FREET3"  Cardiac Enzymes No results for input(s): "CKMB", "TROPONINI", "MYOGLOBIN" in the last 168 hours.  Invalid input(s): "CK" ------------------------------------------------------------------------------------------------------------------ No results found for: "BNP"  CBG: Recent Labs  Lab 05/15/22 1632 05/15/22 2020 05/16/22 0011 05/16/22 0451 05/16/22 0831  GLUCAP 119* 154* 100* 127* 122*    Recent Results (from the past 240 hour(s))  Urine Culture     Status: Abnormal   Collection Time: 05/10/22  4:39 PM   Specimen: Urine, Clean Catch  Result Value Ref Range Status   Specimen Description URINE, CLEAN CATCH  Final   Special Requests   Final    NONE Performed at [facility], [place] [address], [place], [place] [address]    Culture (A)  Final    >=100,000 COLONIES/mL ESCHERICHIA COLI >=100,000 COLONIES/mL PROTEUS MIRABILIS    Report Status 05/13/2022 FINAL  Final   Organism ID, Bacteria ESCHERICHIA COLI (A)  Final   Organism ID, Bacteria PROTEUS MIRABILIS (A)  Final      Susceptibility   Escherichia coli - MIC*    AMPICILLIN <=2 SENSITIVE Sensitive     CEFAZOLIN <=4 SENSITIVE Sensitive     CEFEPIME <=0.12 SENSITIVE Sensitive     CEFTRIAXONE <=0.25 SENSITIVE Sensitive     CIPROFLOXACIN <=0.25 SENSITIVE Sensitive  GENTAMICIN <=1 SENSITIVE Sensitive     IMIPENEM <=0.25 SENSITIVE Sensitive     NITROFURANTOIN <=16 SENSITIVE Sensitive     TRIMETH/SULFA <=20 SENSITIVE Sensitive     AMPICILLIN/SULBACTAM <=2 SENSITIVE Sensitive     PIP/TAZO <=4 SENSITIVE Sensitive     * >=100,000 COLONIES/mL ESCHERICHIA COLI   Proteus mirabilis - MIC*     AMPICILLIN <=2 SENSITIVE Sensitive     CEFAZOLIN <=4 SENSITIVE Sensitive     CEFEPIME <=0.12 SENSITIVE Sensitive     CEFTRIAXONE <=0.25 SENSITIVE Sensitive     CIPROFLOXACIN <=0.25 SENSITIVE Sensitive     GENTAMICIN <=1 SENSITIVE Sensitive     IMIPENEM 1 SENSITIVE Sensitive     NITROFURANTOIN 128 RESISTANT Resistant     TRIMETH/SULFA <=20 SENSITIVE Sensitive     AMPICILLIN/SULBACTAM <=2 SENSITIVE Sensitive     PIP/TAZO <=4 SENSITIVE Sensitive     * >=100,000 COLONIES/mL PROTEUS MIRABILIS     Radiology Studies: No results found.   Pamella Pert, MD, PhD [facility]  Between 7 am - 7 pm I am available, please contact me via Amion (for emergencies) or Securechat (non urgent messages)  Between 7 pm - 7 am I am not available, please contact night coverage MD/APP via Amion

## 2022-05-16 NOTE — Progress Notes (Signed)
Ted hose placed on patient.

## 2022-05-16 NOTE — Progress Notes (Signed)
Patient nauseous. Zofran given

## 2022-05-16 NOTE — TOC Progression Note (Addendum)
Transition of Care [facility]) - Progression Note    Patient Details  Name: Rose Bridges MRN: [id number] Date of Birth: January 04, 1961  Transition of Care [facility] CM/SW [place], [place] Phone Number: 05/16/2022, 2:26 PM  Clinical Narrative:     CSW was notified by medical team that pt may be ready to DC tomorrow. CSW followed up with facility and they will have pt's bed available. CSW spoke with pt's SIL, who pt has determined to be her decision maker. SIL still has not heard from anyone with the medical team, and is requesting an update. TOC will continue to follow for DC needs.   Expected Discharge Plan: [place] Barriers to Discharge: SNF Pending bed offer, Continued Medical Work up  Expected Discharge Plan and Services     Post Acute Care Choice: [employer]                                         Social Determinants of Health (SDOH) Interventions SDOH Screenings   Food Insecurity: No Food Insecurity (05/10/2022)  Housing: Low Risk  (05/10/2022)  Transportation Needs: Unmet Transportation Needs (05/10/2022)  Utilities: Not At Risk (05/10/2022)  Depression (PHQ2-9): Medium Risk (09/09/2019)  Tobacco Use: High Risk (05/10/2022)    Readmission Risk Interventions     No data to display

## 2022-05-16 NOTE — Plan of Care (Signed)
  Problem: Education: Goal: Knowledge of General Education information will improve Description: Including pain rating scale, medication(s)/side effects and non-pharmacologic comfort measures Outcome: Progressing   Problem: Health Behavior/Discharge Planning: Goal: Ability to manage health-related needs will improve Outcome: Progressing   Problem: Clinical Measurements: Goal: Respiratory complications will improve Outcome: Progressing Goal: Cardiovascular complication will be avoided Outcome: Progressing   Problem: Coping: Goal: Level of anxiety will decrease Outcome: Progressing   

## 2022-05-16 NOTE — Progress Notes (Signed)
Patient up walking with mobility tech

## 2022-05-16 NOTE — Progress Notes (Signed)
Mobility Specialist - Progress Note   05/16/22 0900  Mobility  Activity Ambulated with assistance in hallway  Level of Assistance Contact guard assist, steadying assist  Assistive Device Front wheel walker  Distance Ambulated (ft) 80 ft  Activity Response Tolerated well  Mobility Referral Yes  $Mobility charge 1 Mobility    Pt received in bed eager to participate in mobility. No complaints throughout. Left in bed w/ call bell in reach.   [place] Specialist Please contact via SecureChat or Rehab office at [phone]

## 2022-05-16 NOTE — Progress Notes (Signed)
New D5%-0.9% sodium chloride infusion bag hung

## 2022-05-16 NOTE — Progress Notes (Signed)
OT Cancellation Note  Patient Details Name: Rose Bridges MRN: [id number] DOB: June 19, 1960   Cancelled Treatment:    Reason Eval/Treat Not Completed: Patient declined, no reason specified (Pt declined to participate in OT treatment this PM. States that she is not feeling up for it. Will re-attempt at a later date.)  Ailene Ravel, OTR/L,CBIS  Supplemental OT - [facility] and [facility] Secure Chat Preferred   05/16/2022, 4:26 PM

## 2022-05-17 ENCOUNTER — Telehealth: Payer: Self-pay

## 2022-05-17 ENCOUNTER — Other Ambulatory Visit: Payer: Self-pay

## 2022-05-17 ENCOUNTER — Inpatient Hospital Stay (HOSPITAL_COMMUNITY): Payer: Medicare Other

## 2022-05-17 DIAGNOSIS — R932 Abnormal findings on diagnostic imaging of liver and biliary tract: Secondary | ICD-10-CM | POA: Diagnosis not present

## 2022-05-17 DIAGNOSIS — K839 Disease of biliary tract, unspecified: Secondary | ICD-10-CM

## 2022-05-17 DIAGNOSIS — K859 Acute pancreatitis without necrosis or infection, unspecified: Secondary | ICD-10-CM | POA: Diagnosis not present

## 2022-05-17 LAB — GLUCOSE, CAPILLARY
Glucose-Capillary: 104 mg/dL — ABNORMAL HIGH (ref 70–99)
Glucose-Capillary: 120 mg/dL — ABNORMAL HIGH (ref 70–99)
Glucose-Capillary: 124 mg/dL — ABNORMAL HIGH (ref 70–99)
Glucose-Capillary: 125 mg/dL — ABNORMAL HIGH (ref 70–99)
Glucose-Capillary: 145 mg/dL — ABNORMAL HIGH (ref 70–99)
Glucose-Capillary: 92 mg/dL (ref 70–99)

## 2022-05-17 LAB — COMPREHENSIVE METABOLIC PANEL
ALT: 26 U/L (ref 0–44)
AST: 29 U/L (ref 15–41)
Albumin: 2.4 g/dL — ABNORMAL LOW (ref 3.5–5.0)
Alkaline Phosphatase: 121 U/L (ref 38–126)
Anion gap: 4 — ABNORMAL LOW (ref 5–15)
BUN: <5 mg/dL — ABNORMAL LOW (ref 8–23)
CO2: 28 mmol/L (ref 22–32)
Calcium: 8.4 mg/dL — ABNORMAL LOW (ref 8.9–10.3)
Chloride: 107 mmol/L (ref 98–111)
Creatinine, Ser: 0.69 mg/dL (ref 0.44–1.00)
GFR, Estimated: >60 mL/min (ref >60)
Glucose, Bld: 105 mg/dL — ABNORMAL HIGH (ref 70–99)
Potassium: 4 mmol/L (ref 3.5–5.1)
Sodium: 139 mmol/L (ref 135–145)
Total Bilirubin: 0.4 mg/dL (ref 0.3–1.2)
Total Protein: 5.2 g/dL — ABNORMAL LOW (ref 6.5–8.1)

## 2022-05-17 LAB — CBC
HCT: 35.8 % — ABNORMAL LOW (ref 36.0–46.0)
Hemoglobin: 12 g/dL (ref 12.0–15.0)
MCH: 31.9 pg (ref 26.0–34.0)
MCHC: 33.5 g/dL (ref 30.0–36.0)
MCV: 95.2 fL (ref 80.0–100.0)
Platelets: 241 10*3/uL (ref 150–400)
RBC: 3.76 MIL/uL — ABNORMAL LOW (ref 3.87–5.11)
RDW: 12.8 % (ref 11.5–15.5)
WBC: 6.6 10*3/uL (ref 4.0–10.5)
nRBC: 0 % (ref 0.0–0.2)

## 2022-05-17 LAB — MAGNESIUM: Magnesium: 1.5 mg/dL — ABNORMAL LOW (ref 1.7–2.4)

## 2022-05-17 MED ORDER — ONDANSETRON HCL 4 MG/2ML IJ SOLN: 4.0000 mg | Freq: Four times a day (QID) | INTRAMUSCULAR | Status: DC | PRN

## 2022-05-17 NOTE — Progress Notes (Signed)
Physical Therapy Treatment Patient Details Name: Rose Bridges MRN: [id number] DOB: Jul 13, 1960 Today's Date: 05/17/2022   History of Present Illness Pt admitted 1/11 from [facility] with AMS, paranoia and agitation. Pt was at SNF for short term rehab following fall with pelvic fxs 03/31/22. On presentation, UA was suggestive of UTI. 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 PMH:  polycythemia, osteogenesis imperfecta, osteoporosis, hypertension, hyperlipidemia, seizure disorder.    PT Comments    Pt received in supine, tearful and tangential re: social/family situation (see cognition section) but agreeable to therapy session with emphasis on transfer and gait safety, posture/RW use and skin protection. Pt dismissive about risk of remaining in wet briefs and with some redness in peri area, pt agreeable to toilet prior to hallway ambulation and needed some assist for hygiene tasks. Pt needing up to minA with dense safety cues for stand>sit transfers and gait progression with RW for household distance. Pt continues to benefit from PT services to progress toward functional mobility goals.    Recommendations for follow up therapy are one component of a multi-disciplinary discharge planning process, led by the attending physician.  Recommendations may be updated based on patient status, additional functional criteria and insurance authorization.  Follow Up Recommendations  Skilled nursing-short term rehab (<3 hours/day) Can patient physically be transported by private vehicle: Yes   Assistance Recommended at Discharge Intermittent Supervision/Assistance  Patient can return home with the following A little help with walking and/or transfers;A little help with bathing/dressing/bathroom;Help with stairs or ramp for  entrance;Assistance with cooking/housework;Assist for transportation   Equipment Recommendations  None recommended by PT    Recommendations for Other Services       Precautions / Restrictions Precautions Precautions: Fall Restrictions Weight Bearing Restrictions: No     Mobility  Bed Mobility Overal bed mobility: Needs Assistance Bed Mobility: Rolling, Sidelying to Sit Rolling: Modified independent (Device/Increase time) Sidelying to sit: Supervision   Sit to supine: Supervision   General bed mobility comments: min cues for body mechanics/positioning, no physical assist needed; use of rails/HOB elevated    Transfers Overall transfer level: Needs assistance Equipment used: Rolling walker (2 wheels) Transfers: Sit to/from Stand Sit to Stand: Min guard, Min assist           General transfer comment: min guard from elevated bed, minA for lowering assist due to poor eccentric control to sit and pt does not reach back to surface when sitting despite cues given.    Ambulation/Gait Ambulation/Gait assistance: Min assist Gait Distance (Feet): 80 Feet (53ft, seated break, 11ft) Assistive device: Rolling walker (2 wheels) Gait Pattern/deviations: Step-through pattern, Step-to pattern, Decreased dorsiflexion - right, Decreased dorsiflexion - left Gait velocity: decreased     General Gait Details: cues for forward gaze, pt with downward gaze/forward head, improves briefly with cues; very small steps with decreased L hip flexion and B heel strike, cues for these but pt tending to become defensive and only briefly corrects.   Stairs             Wheelchair Mobility    Modified Rankin (Stroke Patients Only)       Balance Overall balance assessment: Needs assistance, History of Falls Sitting-balance support: Feet supported, No upper extremity supported Sitting balance-Leahy Scale: Good     Standing balance support: Reliant on assistive device for balance, Single  extremity supported, During functional  activity Standing balance-Leahy Scale: Poor Standing balance comment: Fair static standing at sink, poor dynamic standing unsupported, with RW no overt LOB                            Cognition Arousal/Alertness: Awake/alert Behavior During Therapy: WFL for tasks assessed/performed Overall Cognitive Status: No family/caregiver present to determine baseline cognitive functioning                                 General Comments: Pt with some emotional lability at beginning of session and states she and her mother have a complicated relationship but that her mother is going on hospice care. WFL for basic conversation, pt tangential frequently but seems aware of this.        Exercises      General Comments General comments (skin integrity, edema, etc.): pt able to initiate peri-care after toileting but needs some assist for completion; pt briefs also changed.      Pertinent Vitals/Pain Pain Assessment Pain Assessment: Faces Faces Pain Scale: Hurts a little bit Pain Location: Pelvis, especially with higher hip flexion during ambulation Pain Descriptors / Indicators: Sore, Grimacing Pain Intervention(s): Monitored during session, Repositioned    Home Living                          Prior Function            PT Goals (current goals can now be found in the care plan section) Acute Rehab PT Goals Patient Stated Goal: to go back to rehab so I can get stronger and go home PT Goal Formulation: With patient Time For Goal Achievement: 05/25/22 Progress towards PT goals: Progressing toward goals    Frequency    Min 3X/week      PT Plan Current plan remains appropriate       AM-PAC PT "6 Clicks" Mobility   Outcome Measure  Help needed turning from your back to your side while in a flat bed without using bedrails?: None Help needed moving from lying on your back to sitting on the side of a flat bed  without using bedrails?: A Little Help needed moving to and from a bed to a chair (including a wheelchair)?: A Little Help needed standing up from a chair using your arms (e.g., wheelchair or bedside chair)?: A Lot (mod safety cues when sitting; sits without reaching back) Help needed to walk in hospital room?: A Little Help needed climbing 3-5 steps with a railing? : Total 6 Click Score: 16    End of Session Equipment Utilized During Treatment: Gait belt Activity Tolerance: Patient tolerated treatment well Patient left: in bed;with call bell/phone within reach;with bed alarm set;Other (comment) (pt refusing OOB to chair) Nurse Communication: Mobility status;Other (comment) (pt comments about being upset re: her mother) PT Visit Diagnosis: Difficulty in walking, not elsewhere classified (R26.2);Muscle weakness (generalized) (M62.81)     Time: 1720-1745 PT Time Calculation (min) (ACUTE ONLY): 25 min  Charges:  $Gait Training: 8-22 mins $Therapeutic Activity: 8-22 mins                     Kolbie Lepkowski P., PTA Acute Rehabilitation Services Secure Chat Preferred 9a-5:30pm Office: [place] 05/17/2022, 6:50 PM

## 2022-05-17 NOTE — Plan of Care (Signed)

## 2022-05-17 NOTE — TOC Progression Note (Signed)
Transition of Care [facility]) - Progression Note    Patient Details  Name: Rose Bridges MRN: [id number] Date of Birth: 11-30-1960  Transition of [facility]) CM/SW Contact  Coralee [place], [place] Phone Number: 05/17/2022, 11:08 AM  Clinical Narrative:    CSW notified by MD that pt is not medically ready to DC today. CSW updated facility. TOC will continue to follow for DC needs.   Expected Discharge Plan: [place] Barriers to Discharge: SNF Pending bed offer, Continued Medical Work up  Expected Discharge Plan and Services     Post Acute Care Choice: [place]                                         Social Determinants of Health (SDOH) Interventions SDOH Screenings   Food Insecurity: No Food Insecurity (05/10/2022)  Housing: Low Risk  (05/10/2022)  Transportation Needs: Unmet Transportation Needs (05/10/2022)  Utilities: Not At Risk (05/10/2022)  Depression (PHQ2-9): Medium Risk (09/09/2019)  Tobacco Use: High Risk (05/10/2022)    Readmission Risk Interventions     No data to display

## 2022-05-17 NOTE — Progress Notes (Signed)
Occupational Therapy Treatment Patient Details Name: Rose Bridges MRN: [id number] DOB: 09/11/1960 Today's Date: 05/17/2022   History of present illness Pt admitted 1/11 from [facility] with AMS, paranoia and agitation. Pt was at SNF for short term rehab following fall with pelvic fxs 03/31/22. PMH:  polycythemia, osteogenesis imperfecta, osteoporosis, hypertension, hyperlipidemia, seizure disorder   OT comments  Pt making great gains toward OT goals this session. She completed LB dressing with mod A and LB bathing with only min A. She has difficulty reaching distally 2/2 pain in pelvis. She completed 100 ft of functional mobility at a slow pace with the RW at min guard level, no LOB. Recommend return to SNF. Pt would benefit from continued acute OT services to facilitate safe d/c home and optimize occupational performance.    Recommendations for follow up therapy are one component of a multi-disciplinary discharge planning process, led by the attending physician.  Recommendations may be updated based on patient status, additional functional criteria and insurance authorization.    Follow Up Recommendations  Skilled nursing-short term rehab (<3 hours/day)     Assistance Recommended at Discharge Frequent or constant Supervision/Assistance  Patient can return home with the following  Assist for transportation;Help with stairs or ramp for entrance;A little help with bathing/dressing/bathroom;A little help with walking and/or transfers   Equipment Recommendations  None recommended by OT    Recommendations for Other Services      Precautions / Restrictions Precautions Precautions: Fall Restrictions Weight Bearing Restrictions: No       Mobility Bed Mobility Overal bed mobility: Needs Assistance Bed Mobility: Supine to Sit     Supine to sit: Supervision Sit to supine: Supervision   General bed mobility comments: able to come to EOB with supervision but no physical assist     Transfers Overall transfer level: Needs assistance Equipment used: Rolling walker (2 wheels) Transfers: Sit to/from Stand Sit to Stand: Min guard     Step pivot transfers: Min guard     General transfer comment: min guard from elevated bed     Balance Overall balance assessment: Needs assistance, History of Falls Sitting-balance support: Feet supported, No upper extremity supported Sitting balance-Leahy Scale: Good     Standing balance support: Reliant on assistive device for balance Standing balance-Leahy Scale: Poor                             ADL either performed or assessed with clinical judgement   ADL               Lower Body Bathing: Minimal assistance;Sit to/from stand       Lower Body Dressing: Moderate assistance;Sit to/from stand Lower Body Dressing Details (indicate cue type and reason): required assist to thread depends sitting EOB             Functional mobility during ADLs: Min guard;Rolling walker (2 wheels)      Extremity/Trunk Assessment Upper Extremity Assessment Upper Extremity Assessment: Generalized weakness   Lower Extremity Assessment Lower Extremity Assessment: Generalized weakness   Cervical / Trunk Assessment Cervical / Trunk Assessment: Kyphotic    Vision       Perception     Praxis      Cognition Arousal/Alertness: Awake/alert Behavior During Therapy: WFL for tasks assessed/performed Overall Cognitive Status: No family/caregiver present to determine baseline cognitive functioning  General Comments: WFL for basic conversation but question higher level cog and if that is baseline for her        Exercises      Shoulder Instructions       General Comments Able to stand statically with no UE support briefly for ~20 seconds with min guard. Pt self limiting at times    Pertinent Vitals/ Pain       Pain Assessment Pain Assessment: 0-10 Pain Score: 4   Pain Location: Pelvis Pain Descriptors / Indicators: Sore Pain Intervention(s): Limited activity within patient's tolerance, Repositioned  Home Living                                          Prior Functioning/Environment              Frequency  Min 2X/week        Progress Toward Goals  OT Goals(current goals can now be found in the care plan section)  Progress towards OT goals: Progressing toward goals  Acute Rehab OT Goals Patient Stated Goal: get back home with mom OT Goal Formulation: With patient Time For Goal Achievement: 05/25/22 Potential to Achieve Goals: Good  Plan Discharge plan remains appropriate    Co-evaluation                 AM-PAC OT "6 Clicks" Daily Activity     Outcome Measure   Help from another person eating meals?: None Help from another person taking care of personal grooming?: None Help from another person toileting, which includes using toliet, bedpan, or urinal?: A Little Help from another person bathing (including washing, rinsing, drying)?: A Little Help from another person to put on and taking off regular upper body clothing?: None Help from another person to put on and taking off regular lower body clothing?: A Lot 6 Click Score: 20    End of Session Equipment Utilized During Treatment: Gait belt;Rolling walker (2 wheels)  OT Visit Diagnosis: Unsteadiness on feet (R26.81)   Activity Tolerance Patient tolerated treatment well   Patient Left in bed;with call bell/phone within reach;with bed alarm set   Nurse Communication Mobility status        Time: 2706-2376 OT Time Calculation (min): 22 min  Charges: OT General Charges $OT Visit: 1 Visit OT Treatments $Therapeutic Activity: 8-22 mins  Laverle Hobby, OTR/L, [place] Acute Rehab Office: [place] 05/17/2022, 11:33 AM

## 2022-05-17 NOTE — Progress Notes (Signed)
PROGRESS NOTE  Tomeika Weinmann DXA:[id number] DOB: 1960/12/22 DOA: 05/10/2022 PCP: Antony Blackbird, MD   LOS: 7 days   Brief Narrative / Interim history: 62 y.o. female with medical history significant for polycythemia, osteogenesis imperfecta, osteoporosis, hypertension, hyperlipidemia, seizure disorder, history of falls, recent pelvic fracture after mechanical fall on 03/31/2022 in her Home resulting in pelvic fracture, recently admitted by family medicine residency teaching service on 04/03/22 and discharged on 04/07/22 (Not FM patient, per resident Dr. Nancy Fetter) sent from SNF due to recent behavioral change, paranoid and agitation with pressured speech.  On presentation, UA was suggestive of UTI.  Head CT was notable for a new age-indeterminate left cerebellar infarct compared to 2018, no intracranial hemorrhage;  sequela of severe chronic microvascular ischemic change with multiple superimposed basal ganglia infarcts.  WBC 10.6, lactic acid 3.0.  She was started on IV fluids and antibiotics.   Subjective / 24h Interval events: Multiple complaints, tells me she feels "weird"  Assesement and Plan: Principal Problem:   AMS (altered mental status) Active Problems:   Acute pancreatitis without infection or necrosis   Abnormal findings on imaging of biliary tract   Principal problem UTI- Present on admission, s/p 5-day course of Rocephin and discontinued on 05/14/2022 urine culture grew more than 100,000 colonies per mL of pansensitive E. coli and Proteus mirabilis.  This was treated   Active problems Acute metabolic encephalopathy, agitation/delirium - Patient presented with behavioral change, catatonia and agitation possibly from UTI.  Mental status initially improved, however in the last day she is feeling "off", difficult to quantify, she tells me she feels "convoluted".  I have discussed over the phone with patient's sister-in-law who is a nurse here, and she tells me that she has significant  history of agoraphobia, hoarding, depression, anxiety, but never really had any paranoid ideations.  She now does, and when she came to the hospital she was complaining that people in the nursing home were trying to cut off her toes.  It does not sound like she has had any formal psychiatric evaluation.  Consult psychiatry, appreciate input  Acute pancreatitis, Elevated LFTs -Started having vomiting and severe abdominal pain since 05/12/2022.  Lipase 2337.  GI consulted, appreciate follow-up.  She has improved abdominal pain overall, advance diet per GI.  Etiology is not entirely clear, she will need a EUS to further evaluate her CBD dilatation but this will be done as an outpatient if she continues to improve.  She also has a very large duodenal diverticulum which can be associated with pancreatitis but appears a bit further distal in her bowel to be related to this.  Another etiology could be ceftriaxone -EUS was scheduled for 07/05/2022 at 9:15 AM  Hypoglycemia -Has had intermittent episodes of hypoglycemia.  Now diet is advanced  Seizure disorder with noncompliance with AEDs -Continue Keppra.  Seizure precautions.  Outpatient follow-up with neurology  Osteogenesis imperfecta -Outpatient follow-up  Hypomagnesemia -Replace.  Repeat a.m. labs  History of recent pelvic fracture in December 2023 -Managed conservatively  Physical deconditioning -Patient presenting from SNF, return to SNF   Scheduled Meds:  calcitonin (salmon)  1 spray Alternating Nares Daily   calcium carbonate  1 tablet Oral BID   enoxaparin (LOVENOX) injection  40 mg Subcutaneous M09O   folic acid  1 mg Oral Daily   levETIRAcetam  500 mg Oral BID   lidocaine  1 patch Transdermal Daily   senna-docusate  1 tablet Oral BID   thiamine  100 mg Oral Daily  Vitamin D (Ergocalciferol)  50,000 Units Oral Q7 days   Continuous Infusions:   PRN Meds:.acetaminophen, bisacodyl, HYDROmorphone (DILAUDID) injection, melatonin,  ondansetron (ZOFRAN) IV, mouth rinse, oxyCODONE, polyethylene glycol, prochlorperazine  Current Outpatient Medications  Medication Instructions   acetaminophen (TYLENOL) 650 mg, Oral, Every 6 hours   acetaminophen (TYLENOL) 1,000 mg, Oral, 3 times daily   Baclofen 5 MG TABS Take 1 tablet by mouth every 8 (eight) hours as needed for muscle spasms.   Baclofen 10 mg, Oral, 2 times daily   calcitonin, salmon, (MIACALCIN/FORTICAL) 200 UNIT/ACT nasal spray 1 spray, Alternating Nares, Daily   calcium carbonate (TUMS - DOSED IN MG ELEMENTAL CALCIUM) 500 MG chewable tablet 1 tablet, Oral, 2 times daily   folic acid (FOLVITE) 1 mg, Oral, Daily   oxyCODONE (OXY IR/ROXICODONE) 5 mg, Oral, Every 4 hours PRN   thiamine (VITAMIN B1) 100 mg, Oral, Daily   Vitamin D (Ergocalciferol) (DRISDOL) 50,000 Units, Oral, Every 7 days    Diet Orders (From admission, onward)     Start     Ordered   05/16/22 0925  Diet Heart Room service appropriate? Yes; Fluid consistency: Thin  Diet effective now       Question Answer Comment  Room service appropriate? Yes   Fluid consistency: Thin      05/16/22 0924            DVT prophylaxis: Place TED hose Start: 05/16/22 0747 enoxaparin (LOVENOX) injection 40 mg Start: 05/10/22 2200   Lab Results  Component Value Date   PLT 241 05/17/2022      Code Status: Full Code  Family Communication: no family at bedside, updated sister-in-law over the phone  Status is: Inpatient  Remains inpatient appropriate because: severity of illness  Level of care: Med-Surg  Consultants:  GI  Objective: Vitals:   05/16/22 1611 05/16/22 2029 05/17/22 0424 05/17/22 0804  BP: 118/61 115/61 129/62 133/73  Pulse: 79 85 74 66  Resp: 16  15 17   Temp: 98.2 F (36.8 C) 97.9 F (36.6 C) 98 F (36.7 C) 98 F (36.7 C)  TempSrc: Oral Oral Oral Oral  SpO2: 94% 94% 94% 95%  Weight:      Height:        Intake/Output Summary (Last 24 hours) at 05/17/2022 1355 Last data filed  at 05/16/2022 2024 Gross per 24 hour  Intake --  Output 1650 ml  Net -1650 ml    Wt Readings from Last 3 Encounters:  05/10/22 59 kg  04/03/22 58 kg  11/15/19 58.3 kg    Examination: Constitutional: NAD Eyes: lids and conjunctivae normal, no scleral icterus ENMT: mmm Neck: normal, supple Respiratory: clear to auscultation bilaterally, no wheezing, no crackles. Normal respiratory effort.  Cardiovascular: Regular rate and rhythm, no murmurs / rubs / gallops. No LE edema. Abdomen: soft, no distention, no tenderness. Bowel sounds positive.   Data Reviewed: I have independently reviewed following labs and imaging studies   CBC Recent Labs  Lab 05/10/22 1521 05/10/22 2150 05/11/22 0312 05/14/22 0251 05/15/22 0309 05/16/22 0320 05/17/22 0447  WBC 10.6*   < > 7.6 7.5 6.6 6.7 6.6  HGB 17.2*   < > 12.4 12.0 12.0 12.2 12.0  HCT 53.1*   < > 37.4 36.7 36.9 36.2 35.8*  PLT 253   < > 211 211 221 238 241  MCV 95.7   < > 95.9 95.6 96.6 93.5 95.2  MCH 31.0   < > 31.8 31.3 31.4 31.5 31.9  MCHC 32.4   < >  33.2 32.7 32.5 33.7 33.5  RDW 12.8   < > 13.1 12.7 12.6 12.5 12.8  LYMPHSABS 2.4  --   --  2.5 2.0 2.5  --   MONOABS 0.9  --   --  0.4 0.4 0.6  --   EOSABS 0.1  --   --  0.4 0.5 0.5  --   BASOSABS 0.0  --   --  0.0 0.0 0.0  --    < > = values in this interval not displayed.     Recent Labs  Lab 05/10/22 1521 05/10/22 1639 05/10/22 2150 05/11/22 0312 05/13/22 0227 05/14/22 0251 05/15/22 0309 05/16/22 0320 05/17/22 0447  NA 138  --   --    < > 136 136 135 136 139  K 4.0  --   --    < > 4.0 4.1 4.2 3.3* 4.0  CL 102  --   --    < > 102 102 101 102 107  CO2 22  --   --    < > 25 25 23 26 28   GLUCOSE 161*  --   --    < > 82 51* 34* 116* 105*  BUN 23  --   --    < > 7* 8 7* 5* <5*  CREATININE 0.85  --  0.80   < > 0.57 0.65 0.69 0.60 0.69  CALCIUM 9.5  --   --    < > 8.8* 8.2* 8.8* 8.6* 8.4*  AST 31  --   --   --  84* 43* 32 26 29  ALT 17  --   --   --  56* 37 30 24 26    ALKPHOS 135*  --   --   --  160* 144* 149* 128* 121  BILITOT 0.7  --   --   --  0.7 0.6 0.8 0.6 0.4  ALBUMIN 3.7  --   --   --  2.7* 2.5* 2.6* 2.5* 2.4*  MG 1.8  --   --    < > 1.5* 1.8 1.4* 1.5* 1.5*  LATICACIDVEN 3.0* 1.6  --   --   --   --   --   --   --   TSH  --   --  1.152  --   --   --   --   --   --   AMMONIA 22  --   --   --   --   --   --   --   --    < > = values in this interval not displayed.     ------------------------------------------------------------------------------------------------------------------ No results for input(s): "CHOL", "HDL", "LDLCALC", "TRIG", "CHOLHDL", "LDLDIRECT" in the last 72 hours.   Lab Results  Component Value Date   HGBA1C 5.8 (H) 09/25/2015   ------------------------------------------------------------------------------------------------------------------ No results for input(s): "TSH", "T4TOTAL", "T3FREE", "THYROIDAB" in the last 72 hours.  Invalid input(s): "FREET3"  Cardiac Enzymes No results for input(s): "CKMB", "TROPONINI", "MYOGLOBIN" in the last 168 hours.  Invalid input(s): "CK" ------------------------------------------------------------------------------------------------------------------ No results found for: "BNP"  CBG: Recent Labs  Lab 05/16/22 2029 05/17/22 0014 05/17/22 0436 05/17/22 0802 05/17/22 1222  GLUCAP 177* 145* 120* 104* 124*     Recent Results (from the past 240 hour(s))  Urine Culture     Status: Abnormal   Collection Time: 05/10/22  4:39 PM   Specimen: Urine, Clean Catch  Result Value Ref Range Status   Specimen Description URINE, CLEAN CATCH  Final   Special Requests   Final    NONE Performed at [facility], [address], [place], [place] [address]    Culture (A)  Final    >=100,000 COLONIES/mL ESCHERICHIA COLI >=100,000 COLONIES/mL PROTEUS MIRABILIS    Report Status 05/13/2022 FINAL  Final   Organism ID, Bacteria ESCHERICHIA COLI (A)  Final   Organism ID, Bacteria  PROTEUS MIRABILIS (A)  Final      Susceptibility   Escherichia coli - MIC*    AMPICILLIN <=2 SENSITIVE Sensitive     CEFAZOLIN <=4 SENSITIVE Sensitive     CEFEPIME <=0.12 SENSITIVE Sensitive     CEFTRIAXONE <=0.25 SENSITIVE Sensitive     CIPROFLOXACIN <=0.25 SENSITIVE Sensitive     GENTAMICIN <=1 SENSITIVE Sensitive     IMIPENEM <=0.25 SENSITIVE Sensitive     NITROFURANTOIN <=16 SENSITIVE Sensitive     TRIMETH/SULFA <=20 SENSITIVE Sensitive     AMPICILLIN/SULBACTAM <=2 SENSITIVE Sensitive     PIP/TAZO <=4 SENSITIVE Sensitive     * >=100,000 COLONIES/mL ESCHERICHIA COLI   Proteus mirabilis - MIC*    AMPICILLIN <=2 SENSITIVE Sensitive     CEFAZOLIN <=4 SENSITIVE Sensitive     CEFEPIME <=0.12 SENSITIVE Sensitive     CEFTRIAXONE <=0.25 SENSITIVE Sensitive     CIPROFLOXACIN <=0.25 SENSITIVE Sensitive     GENTAMICIN <=1 SENSITIVE Sensitive     IMIPENEM 1 SENSITIVE Sensitive     NITROFURANTOIN 128 RESISTANT Resistant     TRIMETH/SULFA <=20 SENSITIVE Sensitive     AMPICILLIN/SULBACTAM <=2 SENSITIVE Sensitive     PIP/TAZO <=4 SENSITIVE Sensitive     * >=100,000 COLONIES/mL PROTEUS MIRABILIS     Radiology Studies: No results found.   Pamella Pert, MD, PhD [facility]  Between 7 am - 7 pm I am available, please contact me via Amion (for emergencies) or Securechat (non urgent messages)  Between 7 pm - 7 am I am not available, please contact night coverage MD/APP via Amion

## 2022-05-18 DIAGNOSIS — N3000 Acute cystitis without hematuria: Secondary | ICD-10-CM | POA: Diagnosis not present

## 2022-05-18 DIAGNOSIS — F05 Delirium due to known physiological condition: Secondary | ICD-10-CM | POA: Insufficient documentation

## 2022-05-18 DIAGNOSIS — R4182 Altered mental status, unspecified: Secondary | ICD-10-CM | POA: Diagnosis not present

## 2022-05-18 DIAGNOSIS — R932 Abnormal findings on diagnostic imaging of liver and biliary tract: Secondary | ICD-10-CM | POA: Diagnosis not present

## 2022-05-18 DIAGNOSIS — K859 Acute pancreatitis without necrosis or infection, unspecified: Secondary | ICD-10-CM | POA: Diagnosis not present

## 2022-05-18 LAB — CBC
HCT: 36.3 % (ref 36.0–46.0)
Hemoglobin: 12.4 g/dL (ref 12.0–15.0)
MCH: 32.5 pg (ref 26.0–34.0)
MCHC: 34.2 g/dL (ref 30.0–36.0)
MCV: 95 fL (ref 80.0–100.0)
Platelets: 266 10*3/uL (ref 150–400)
RBC: 3.82 MIL/uL — ABNORMAL LOW (ref 3.87–5.11)
RDW: 12.5 % (ref 11.5–15.5)
WBC: 6.9 10*3/uL (ref 4.0–10.5)
nRBC: 0 % (ref 0.0–0.2)

## 2022-05-18 LAB — BASIC METABOLIC PANEL
Anion gap: 7 (ref 5–15)
BUN: 7 mg/dL — ABNORMAL LOW (ref 8–23)
CO2: 29 mmol/L (ref 22–32)
Calcium: 8.8 mg/dL — ABNORMAL LOW (ref 8.9–10.3)
Chloride: 104 mmol/L (ref 98–111)
Creatinine, Ser: 0.72 mg/dL (ref 0.44–1.00)
GFR, Estimated: >60 mL/min (ref >60)
Glucose, Bld: 98 mg/dL (ref 70–99)
Potassium: 4.1 mmol/L (ref 3.5–5.1)
Sodium: 140 mmol/L (ref 135–145)

## 2022-05-18 LAB — HEMOGLOBIN A1C
Hgb A1c MFr Bld: 5.5 % (ref 4.8–5.6)
Mean Plasma Glucose: 111.15 mg/dL

## 2022-05-18 LAB — GLUCOSE, CAPILLARY
Glucose-Capillary: 95 mg/dL (ref 70–99)
Glucose-Capillary: 91 mg/dL (ref 70–99)
Glucose-Capillary: 115 mg/dL — ABNORMAL HIGH (ref 70–99)
Glucose-Capillary: 145 mg/dL — ABNORMAL HIGH (ref 70–99)
Glucose-Capillary: 116 mg/dL — ABNORMAL HIGH (ref 70–99)

## 2022-05-18 LAB — MAGNESIUM: Magnesium: 1.4 mg/dL — ABNORMAL LOW (ref 1.7–2.4)

## 2022-05-18 LAB — AMMONIA: Ammonia: 36 umol/L — ABNORMAL HIGH (ref 9–35)

## 2022-05-18 LAB — LIPASE, BLOOD: Lipase: 96 U/L — ABNORMAL HIGH (ref 11–51)

## 2022-05-18 MED ORDER — MAGNESIUM 30 MG PO TABS: 30.0000 mg | ORAL_TABLET | Freq: Two times a day (BID) | ORAL | Status: DC

## 2022-05-18 MED ORDER — LEVETIRACETAM 500 MG PO TABS: 500.0000 mg | ORAL_TABLET | Freq: Two times a day (BID) | ORAL | Status: AC

## 2022-05-18 MED ORDER — AMOXICILLIN-POT CLAVULANATE 875-125 MG PO TABS: 1.0000 | ORAL_TABLET | Freq: Two times a day (BID) | ORAL | 0 refills | Status: AC

## 2022-05-18 MED ORDER — OXYCODONE HCL 5 MG PO TABS: 5.0000 mg | ORAL_TABLET | Freq: Three times a day (TID) | ORAL | 0 refills | Status: DC

## 2022-05-18 MED ORDER — MAGNESIUM SULFATE 4 GM/100ML IV SOLN
4.0000 g | Freq: Once | INTRAVENOUS | Status: AC
  Administered 2022-05-18: 4 g via INTRAVENOUS
  Filled 2022-05-18: qty 100

## 2022-05-18 MED ORDER — AMOXICILLIN-POT CLAVULANATE 875-125 MG PO TABS
1.0000 | ORAL_TABLET | Freq: Two times a day (BID) | ORAL | Status: DC
  Administered 2022-05-18 (×2): 1 via ORAL
  Filled 2022-05-18 (×2): qty 1

## 2022-05-18 NOTE — Progress Notes (Addendum)
Lenell Antu to be D/C'd  per MD order.  Gave report to [facility], they aware patient is waiting for [name].  VSS, Skin clean, dry and intact without evidence of skin break down, no evidence of skin tears noted.  An After Visit Summary was printed and place in the chart. Patient is waiting for discharge and the next shift is aware.

## 2022-05-18 NOTE — Consult Note (Signed)
[facility] Psychiat[facility] New Face-to-Face Psychiatric Evaluation  Service Date: May 18, 2022 LOS:  LOS: 8 days   Assessment  [facility]ges is a 62 y.o. female admitted medically for 05/10/2022  1:14 PM for altered mental status in the setting of UTI. On chart review, patient has the psychiatric diagnoses of GAD, agoraphobia, and hoarding disorder and has a past medical history of polycythemia, osteogenesis imperfecta, osteoporosis, hypertension, hyperlipidemia, seizure disorder, history of falls, recent pelvic fracture after mechanical fall. Consult / Liaison Psychiatry was consulted for waxing and waning paranoid ideation by Dr. Geradine Girt  from the [facility] service.  On initial evaluation, patient recounted feeling confused, disoriented, and paranoid about staff attempting to harm her when she was at the SNF which persisted in the hospital. This subsided last night. This was also observed by hospital staff and described by Dr. Wyonia Hough as waxing and waning in quality. Patient was admitted for an acute medical condition (UTI) and exposed to a new setting. She meets criteria for delirium due to another medical condition, acute, hyperactive.   Patient confirms history of agoraphobia.  these diagnoses are provisional diagnoses and subject to change as the patient's clinical picture evolves or new information is revealed, including substances (drugs of abuse, medications), another medical condition, or better explained by another psychiatric diagnosis.  Psychotropic medications: Patient reports being on psychotropics in the past but cannot recall medications that were trialed  Diagnoses:  Active Hospital problems: Principal Problem:   AMS (altered mental status) Active Problems:   Acute pancreatitis without infection or necrosis   Abnormal findings on imaging of biliary tract   Delirium due to another medical condition, acute, hyperactive    Plan  ## Safety and  Observation Level:  - Consult request was unrelated to self-harm. Based on my clinical evaluation, I estimate the patient to be at low risk of self harm in the current setting - At this time, we recommend a no additional level of observation. This decision is based on my review of the chart including patient's history and current presentation, interview of the patient, mental status examination, and consideration of suicide risk including evaluating suicidal ideation, plan, intent, suicidal or self-harm behaviors, risk factors, and protective factors. This judgment is based on our ability to directly address suicide risk, implement suicide prevention strategies and develop a safety plan while the patient is in the clinical setting. - Please contact our team if there is a concern that risk level has changed  ## Interventions (medications, psychoeducation, etc):  - No medication recommendations - Provided delirium education to patient  ## Medical Decision Making Capacity:  - Not formally assessed during this encounter  ## Further Work-up: - most recent EKG on 05/10/2022 had QTc of 438 - pertinent labwork reviewed earlier this admission includes: CBC w/ diff, CMP, urinalysis, urine and blood toxicologies  ## Disposition:  - No indication for inpatient psychiatric admission. No indication for outpatient psychiatry / psychology follow-up. Defer immediate disposition plan to primary team  ## Behavioral / Environmental:  -- Standard delirium precautions and Fall precautions  ##Legal Status -- Patient voluntary  Thank you for this consult request. Our recommendations are listed above.  We will sign-off at this time  [name], MD  NEW history  Relevant Aspects of Hospital Course:  Admitted on 05/10/2022 for AMS in the setting of UTI.  Patient Report:  Patient is unhappy about the service here at [facility]  She says the nurses and doctors are nice,  but the technicians and aids do not help  her in time and have her sitting in her urine and feces for over an hour.  She would like to go to another SNF but she was told that the SNF she is in is the best one in [place].  She confirms that she was feeling paranoid and confused when she was at the skilled nursing facility - she told the nurses there that she was not feeling well but they did not believe her.  She thinks that part of what made her confused was because her mother was recently switched to hospice care after struggling with Alzheimer's for a long time.  Compared to last night, she feels significantly less confused and realized that she was acting strangely when she thought staff members at her skilled nursing facility were trying to hurt her.  She does not believe that being in a new environment here at [facility] compared to her SNF contributed to her delirium.  She says she was treated by a psychiatrist in the past but does not recall for what nor does she recall what medications she was on but she did not like them.  She is eager to leave the hospital and go back home.  Past Psychiatric History:  Previous psych diagnoses:  agoraphobia Current/prior outpatient psychiatric treatment: reports prior treatment but could not recall details Current psychiatric provider: N/A  Social History:  Patient has granddaughter who is 22  Substance Use History: Tried smoking in past. Drank socially in past.  Family History:  The patient's family history includes Heart attack in her mother; Hypertension in her mother; Lung cancer in her father; Stroke in her brother, mother, and sister.  Medical History: Past Medical History:  Diagnosis Date   Hyperlipemia    Hypertension    Osteogenesis imperfecta    Osteoporosis     Surgical History: Past Surgical History:  Procedure Laterality Date   ABDOMINAL HYSTERECTOMY     HIP ARTHROPLASTY Left 09/26/2015   Procedure: ARTHROPLASTY BIPOLAR HIP (HEMIARTHROPLASTY);  Surgeon: Renette Butters, MD;   Location: [place];  Service: Orthopedics;  Laterality: Left;   ORIF HUMERUS FRACTURE Right 11/06/2016   Procedure: OPEN REDUCTION INTERNAL FIXATION (ORIF) PROXIMAL HUMERUS FRACTURE;  Surgeon: Renette Butters, MD;  Location: [facility] [facility];  Service: Orthopedics;  Laterality: Right;    Medications:   Current Facility-Administered Medications:    acetaminophen (TYLENOL) tablet 650 mg, 650 mg, Oral, Q6H PRN, Kayleen Memos, DO, 650 mg at 05/17/22 2356   amoxicillin-clavulanate (AUGMENTIN) 875-125 MG per tablet 1 tablet, 1 tablet, Oral, Q12H, Caren Griffins, MD, 1 tablet at 05/18/22 1435   bisacodyl (DULCOLAX) suppository 10 mg, 10 mg, Rectal, Daily PRN, Starla Link, Kshitiz, MD   calcitonin (salmon) (MIACALCIN/FORTICAL) nasal spray 1 spray, 1 spray, Alternating Nares, Daily, Hall, Carole N, DO, 1 spray at 05/18/22 1610   calcium carbonate (TUMS - dosed in mg elemental calcium) chewable tablet 200 mg of elemental calcium, 1 tablet, Oral, BID, Hall, Carole N, DO, 200 mg of elemental calcium at 05/18/22 0922   enoxaparin (LOVENOX) injection 40 mg, 40 mg, Subcutaneous, Q24H, Hall, Carole N, DO, 40 mg at 96/04/54 0981   folic acid (FOLVITE) tablet 1 mg, 1 mg, Oral, Daily, Hall, Carole N, DO, 1 mg at 05/18/22 1914   HYDROmorphone (DILAUDID) injection 0.5 mg, 0.5 mg, Intravenous, Q2H PRN, Starla Link, Kshitiz, MD, 0.5 mg at 05/16/22 0539   levETIRAcetam (KEPPRA) tablet 500 mg, 500 mg, Oral, BID, Kayleen Memos,  DO, 500 mg at 05/18/22 0923   lidocaine (LIDODERM) 5 % 1 patch, 1 patch, Transdermal, Daily, Alekh, Kshitiz, MD, 1 patch at 05/18/22 0923   melatonin tablet 5 mg, 5 mg, Oral, QHS PRN, Irene Pap N, DO, 5 mg at 05/17/22 2356   ondansetron (ZOFRAN) injection 4 mg, 4 mg, Intravenous, Q6H PRN, Caren Griffins, MD   Oral care mouth rinse, 15 mL, Mouth Rinse, PRN, Starla Link, Kshitiz, MD   oxyCODONE (Oxy IR/ROXICODONE) immediate release tablet 5 mg, 5 mg, Oral, Q6H PRN, Hall, Carole N, DO, 5 mg at 05/18/22 1205    polyethylene glycol (MIRALAX / GLYCOLAX) packet 17 g, 17 g, Oral, Daily PRN, Starla Link, Kshitiz, MD, 17 g at 05/11/22 2137   prochlorperazine (COMPAZINE) injection 5 mg, 5 mg, Intravenous, Q6H PRN, Hall, Carole N, DO, 5 mg at 05/16/22 1456   senna-docusate (Senokot-S) tablet 1 tablet, 1 tablet, Oral, BID, Alekh, Kshitiz, MD, 1 tablet at 05/18/22 3500   thiamine (VITAMIN B1) tablet 100 mg, 100 mg, Oral, Daily, Hall, Carole N, DO, 100 mg at 05/18/22 9381   Vitamin D (Ergocalciferol) (DRISDOL) 1.25 MG (50000 UNIT) capsule 50,000 Units, 50,000 Units, Oral, Q7 days, Kayleen Memos, DO, 50,000 Units at 05/17/22 2155  Allergies: Allergies  Allergen Reactions   Codeine Other (See Comments)    Migraine     Objective  Vital signs:  Temp:  [97.8 F (36.6 C)-98.4 F (36.9 C)] 98.1 F (36.7 C) (01/19 1610) Pulse Rate:  [70-106] 88 (01/19 1610) Resp:  [17-18] 17 (01/19 1610) BP: (117-147)/(61-96) 122/70 (01/19 1610) SpO2:  [95 %-98 %] 97 % (01/19 1610)  Mental Status Exam:  Appearance and Grooming: Patient is casually dressed in hospital gown . The patient has no noticeable scent or odor.  Behavior: The patient appears in no acute distress, and during the interview, was distracted and required frequent redirection. She was able to follow commands and compliant to requests and made good eye contact.  The patient did not appear internally or externally preoccupied.  Attitude: Patient's attitude towards the interviewer was cooperative and irritable, as evidenced by appearing visibly annoyed when told a new environment might be contributing to her delirium .  Motor activity: There was no notable abnormal facial movements and no notable abnormal extremity movements.  Speech: The volume of her speech was normal and voluminous in quantity. The rate was normal with a normal rhythm. Responses were normal in latency. There were no abnormal patterns in speech.  Mood: "I am being treated like  shit"  Affect: Patient's affect is dysphoric and tearful at times, irritable to questioning, and angry with technicians / nursing aids with broad range and even fluctuations. Her affect is appropriate for the topic of conversation. ------------------------------------------------------------------------------------------------------------------------- Perception The patient experiences no hallucinations.  Thought Content The patient describes no delusional thoughts at present, though acknowledges she was paranoid about staff hurting her.  Patient denies active suicidal intent and denies passive suicidal ideation. She denies homicidal intent, though did feel homicidal towards staff during her brief paranoid state.  Thought Process The patient's thought process is tangential, often diverging to peripheral topics and historical narratives when asked questions .  Insight The patient at the time of interview demonstrates poor insight, as evidenced by lacking understanding of mental health condition/s, inability to identify trigger/s causing mental health decompensation, and inability to identify adaptive and maladaptive coping strategies.  Judgement The patient over the past 24 hours demonstrates fair judgement, as evidenced by adhering to non-psychotropic medication  regimen and engaging inappropriately with staff / other patients.  Assets  Assets:No data recorded  Sleep  Sleep:No data recorded  Physical Exam: Physical Exam Vitals and nursing note reviewed.  HENT:     Head: Normocephalic and atraumatic.  Pulmonary:     Effort: Pulmonary effort is normal.  Neurological:     General: No focal deficit present.     Mental Status: She is alert. Mental status is at baseline.    Blood pressure 122/70, pulse 88, temperature 98.1 F (36.7 C), temperature source Oral, resp. rate 17, height 5\' 4"  (1.626 m), weight 59 kg, SpO2 97 %. Body mass index is 22.31 kg/m.

## 2022-05-18 NOTE — TOC Transition Note (Signed)
Transition of Care [facility]) - CM/SW Discharge Note   Patient Details  Name: Rose Bridges MRN: [id number] Date of Birth: [birth year]  Transition of Care [facility]) CM/SW Contact:  Amador Cunas, [place] Phone Number: 05/18/2022, 2:04 PM   Clinical Narrative:  Pt for dc to [facility] for continued rehab. Spoke to Nora in admissions who confirmed they are prepared to admit pt to room 1204p. Pt's niece Nira Conn aware of dc and reports agreeable. RN provided with number for report and [name] arranged for transport. SW signing off at dc.   Wandra Feinstein, MSW, LCSW [phone] (coverage)       Final next level of care: Skilled Nursing Facility Barriers to Discharge: No Barriers Identified   Patient Goals and CMS Choice CMS Medicare.gov Compare Post Acute Care list provided to:: Patient Choice offered to / list presented to : Patient  Discharge Placement                Patient chooses bed at: [facility] Patient to be transferred to facility by: [place] Name of family member notified: heather/niece Patient and family notified of of transfer: 05/18/22  Discharge Plan and Services Additional resources added to the After Visit Summary for       Post Acute Care Choice: [place]                               Social Determinants of Health (SDOH) Interventions SDOH Screenings   Food Insecurity: No Food Insecurity (05/10/2022)  Housing: Low Risk  (05/10/2022)  Transportation Needs: Unmet Transportation Needs (05/10/2022)  Utilities: Not At Risk (05/10/2022)  Depression (PHQ2-9): Medium Risk (09/09/2019)  Tobacco Use: High Risk (05/10/2022)     Readmission Risk Interventions     No data to display

## 2022-05-18 NOTE — Progress Notes (Signed)
Mobility Specialist - Progress Note   05/18/22 1123  Mobility  Activity Ambulated with assistance in hallway  Level of Assistance Contact guard assist, steadying assist  Assistive Device Front wheel walker  Distance Ambulated (ft) 100 ft  Activity Response Tolerated well  Mobility Referral Yes  $Mobility charge 1 Mobility    Pt received in bed agreeable to mobility. Tolerated increased distance well, no complaints. Left in bed w/ NT present at bed side.   [place] Specialist Please contact via SecureChat or Rehab office at [phone]

## 2022-05-18 NOTE — Care Management Important Message (Signed)
Important Message  Patient Details  Name: Rose Bridges MRN: [id number] Date of Birth: 1960/06/03   Medicare Important Message Given:  Yes     Hannah Beat 05/18/2022, 2:35 PM

## 2022-05-18 NOTE — Progress Notes (Signed)
Progress Note   Subjective  Patient tearful in room today - reports nursing staff is not changing her frequently enough. Reports she had a large BM yesterday after eating. Pain seems to be improving.   Objective   Vital signs in last 24 hours: Temp:  [97.8 F (36.6 C)-98.4 F (36.9 C)] 98.4 F (36.9 C) (01/19 0355) Pulse Rate:  [66-106] 72 (01/19 0355) Resp:  [17-18] 18 (01/19 0355) BP: (117-147)/(61-95) 135/70 (01/19 0355) SpO2:  [95 %-97 %] 95 % (01/19 0355) Last BM Date : 05/17/22 General:    white female in NAD Neurologic:  Alert and oriented,  grossly normal neurologically. Psych:  Cooperative. Normal mood and affect.  Intake/Output from previous day: No intake/output data recorded. Intake/Output this shift: No intake/output data recorded.  Lab Results: Recent Labs    05/16/22 0320 05/17/22 0447 05/18/22 0341  WBC 6.7 6.6 6.9  HGB 12.2 12.0 12.4  HCT 36.2 35.8* 36.3  PLT 238 241 266   BMET Recent Labs    05/16/22 0320 05/17/22 0447 05/18/22 0341  NA 136 139 140  K 3.3* 4.0 4.1  CL 102 107 104  CO2 26 28 29   GLUCOSE 116* 105* 98  BUN 5* <5* 7*  CREATININE 0.60 0.69 0.72  CALCIUM 8.6* 8.4* 8.8*   LFT Recent Labs    05/17/22 0447  PROT 5.2*  ALBUMIN 2.4*  AST 29  ALT 26  ALKPHOS 121  BILITOT 0.4   PT/INR No results for input(s): "LABPROT", "INR" in the last 72 hours.  Studies/Results: MR BRAIN WO CONTRAST  Result Date: 05/17/2022 CLINICAL DATA:  Mental status change, unknown cause EXAM: MRI HEAD WITHOUT CONTRAST TECHNIQUE: Multiplanar, multiecho pulse sequences of the brain and surrounding structures were obtained without intravenous contrast. COMPARISON:  CT head 05/10/2022. FINDINGS: Brain: No acute infarction, hemorrhage, hydrocephalus, extra-axial collection or mass lesion. 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 Vascular: Major arterial flow voids are maintained at the skull base. Skull and  upper cervical spine: Normal marrow signal. Sinuses/Orbits: Mild paranasal sinus mucosal thickening. No acute orbital findings. Other: No sizable mastoid effusions. IMPRESSION: 1. No evidence of acute intracranial abnormality. 2. Many remote lacunar infarcts. Electronically Signed   By: Margaretha Sheffield M.D.   On: 05/17/2022 15:56       Assessment / Plan:    62 y/o female here with the following:  Acute pancreatitis Dilated bile duct /abnormal biliary imaging Recent UTI treated with Rocephin, stopped on 1/15 Recent pelvic fracture nonoperative management - osteogenesis imperfecta   She continues to improve. Tolerating PO, having bowel movements. She is frustrated with being in the hospital and wants to go home. Cleared by my perspective if she is tolerating PO. Her labs have been stable.   I have reviewed her imaging studies with her and advanced endoscopy, Dr. Rush Landmark.  Unclear what has driven her pancreatitis at this point.  She has biliary ductal dilation on imaging but unclear chronicity of this and if related to her pancreatitis or not.  She only had very mild elevation in her liver enzymes which normalized.  She has a very large duodenal diverticulum which can rarely be associated with pancreatitis however in her case perhaps this appears a bit further distal in her bowel to be related to this.  Another potential etiology would be ceftriaxone which has been reported to cause pancreatitis, she is no longer on this.   She is scheduled for an outpatient EUS  with Dr. Rush Landmark to further evaluate MRCP findings, date is  07/05/22 at 67 AM.    PLAN: - okay to discharge today - low fat diet - EUS with Dr. Rush Landmark scheduled for 07/05/22 - discussed with patient, she is aware and understands what this is, risks / benefits  We will sign off for now, call with questions in the interim.  Jolly Mango, MD [facility]

## 2022-05-18 NOTE — Discharge Summary (Addendum)
Physician Discharge Summary  Rose Bridges ZCH:[id number] DOB: 1960/08/20 DOA: 05/10/2022  PCP: Antony Blackbird, MD  Admit date: 05/10/2022 Discharge date: 05/18/2022  Admitted From: SNF Disposition:  SNF  Recommendations for Outpatient Follow-up:  Follow up with PCP in 1-2 weeks Please obtain BMP/CBC in one week Follow-up with gastroenterology as scheduled as below Recommend outpatient psychiatric referral and follow-up  Home Health: none Equipment/Devices: none  Discharge Condition: stable CODE STATUS: Full code Diet Orders (From admission, onward)     Start     Ordered   05/16/22 0925  Diet Heart Room service appropriate? Yes; Fluid consistency: Thin  Diet effective now       Question Answer Comment  Room service appropriate? Yes   Fluid consistency: Thin      05/16/22 0924            HPI: Per admitting MD, Rose Bridges is a 62 y.o. female with medical history significant for polycythemia, osteogenesis imperfecta, osteoporosis, hypertension, hyperlipidemia, seizure disorder, history of falls, recent pelvic fracture after mechanical fall on 03/31/2022 in her Home resulting in pelvic fracture, recently admitted by family medicine residency teaching service on 04/03/22 and discharged on 04/07/22 (Not FM patient, per resident Dr. Nancy Fetter) sent from SNF due to recent behavioral change, paranoid and agitation with pressured speech. In the ED, workup revealed UA positive for pyuria.  Head CT is notable for a new age-indeterminate left cerebellar infarct compared to 2018, no intracranial hemorrhage.  Sequela of severe chronic microvascular ischemic change with multiple superimposed basal ganglia infarcts. Urine culture was ordered, and the patient was started on Rocephin empirically in the ED.  Additionally she received 1 L IV fluid bolus NS.  And IV fluid maintenance was initiated NS at 125 cc/h. At the time of the visit, her mentation had improved, however, she was unsure of the  circumstances that led to her ED presentation.  Hospital Course / Discharge diagnoses: Principal problem UTI- Present on admission, s/p 5-day course of Rocephin and discontinued on 05/14/2022 urine culture grew more than 100,000 colonies per mL of pansensitive E. coli and Proteus mirabilis.  This was treated but still had some residual dysuria on discharge, will add 3 additional days of Augmentin.   Active problems Acute metabolic encephalopathy, agitation/delirium, history of agoraphobia and social anxiety - Patient presented with behavioral change, catatonia and agitation possibly from UTI.  Mental status improved. Psychiatry consulted, felt to be hyperactive delirium.  Given chronic longstanding history of agoraphobia, anxiety, patient will benefit from ongoing outpatient psychiatric follow-up. Acute pancreatitis, Elevated LFTs -Started having vomiting and severe abdominal pain since 05/12/2022.  Lipase initially 2337.  GI consulted, appreciate follow-up.  She has improved abdominal pain overall, advance diet per GI.  Etiology is not entirely clear, she will need a EUS to further evaluate her CBD dilatation but this will be done as an outpatient.  She also has a very large duodenal diverticulum which can be associated with pancreatitis but appears a bit further distal in her bowel to be related to this.  Another etiology could be ceftriaxone. EUS was scheduled as an outpatient for 07/05/2022 at 9:15 AM Hypoglycemia -Has had intermittent episodes of hypoglycemia.  Now diet is advanced Seizure disorder with noncompliance with AEDs -Continue Keppra.  Seizure precautions.  Outpatient follow-up with neurology Osteogenesis imperfecta -Outpatient follow-up Hypomagnesemia -Replaced, adding magnesium on discharge medications  History of recent pelvic fracture in December 2023  -Managed conservatively Physical deconditioning -Patient presenting from SNF, return to SNF  Sepsis ruled out   Discharge  Instructions   Allergies as of 05/18/2022       Reactions   Codeine Other (See Comments)   Migraine        Medication List     TAKE these medications    acetaminophen 500 MG tablet Commonly known as: TYLENOL Take 1,000 mg by mouth 3 (three) times daily. What changed: Another medication with the same name was removed. Continue taking this medication, and follow the directions you see here.   amoxicillin-clavulanate 875-125 MG tablet Commonly known as: AUGMENTIN Take 1 tablet by mouth every 12 (twelve) hours for 3 days.   Baclofen 5 MG Tabs Take 1 tablet by mouth every 8 (eight) hours as needed for muscle spasms. What changed: Another medication with the same name was removed. Continue taking this medication, and follow the directions you see here.   calcitonin (salmon) 200 UNIT/ACT nasal spray Commonly known as: MIACALCIN/FORTICAL Place 1 spray into alternate nostrils daily.   calcium carbonate 500 MG chewable tablet Commonly known as: TUMS - dosed in mg elemental calcium Chew 1 tablet by mouth 2 (two) times daily.   folic acid 1 MG tablet Commonly known as: FOLVITE Take 1 tablet (1 mg total) by mouth daily.   levETIRAcetam 500 MG tablet Commonly known as: KEPPRA Take 1 tablet (500 mg total) by mouth 2 (two) times daily.   magnesium 30 MG tablet Take 1 tablet (30 mg total) by mouth 2 (two) times daily.   oxyCODONE 5 MG immediate release tablet Commonly known as: Oxy IR/ROXICODONE Take 1 tablet (5 mg total) by mouth every 8 (eight) hours.   thiamine 100 MG tablet Commonly known as: VITAMIN B1 Take 1 tablet (100 mg total) by mouth daily.   Vitamin D (Ergocalciferol) 1.25 MG (50000 UNIT) Caps capsule Commonly known as: DRISDOL Take 1 capsule (50,000 Units total) by mouth every 7 (seven) days. What changed: additional instructions        Contact information for after-discharge care     Destination     [facility] Preferred  SNF .   Service: Skilled Nursing Contact information: [address] [place] [address] [phone]                   Consultations: GI Psychiatry  Procedures/Studies:  MR BRAIN WO CONTRAST  Result Date: 05/17/2022 CLINICAL DATA:  Mental status change, unknown cause EXAM: MRI HEAD WITHOUT CONTRAST TECHNIQUE: Multiplanar, multiecho pulse sequences of the brain and surrounding structures were obtained without intravenous contrast. COMPARISON:  CT head 05/10/2022. FINDINGS: Brain: No acute infarction, hemorrhage, hydrocephalus, extra-axial collection or mass lesion. 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 Vascular: Major arterial flow voids are maintained at the skull base. Skull and upper cervical spine: Normal marrow signal. Sinuses/Orbits: Mild paranasal sinus mucosal thickening. No acute orbital findings. Other: No sizable mastoid effusions. IMPRESSION: 1. No evidence of acute intracranial abnormality. 2. Many remote lacunar infarcts. Electronically Signed   By: Feliberto Harts M.D.   On: 05/17/2022 15:56   MR ABDOMEN MRCP W WO CONTAST  Result Date: 05/14/2022 CLINICAL DATA:  Pancreatitis. EXAM: MRI ABDOMEN WITHOUT AND WITH CONTRAST (INCLUDING MRCP) TECHNIQUE: Multiplanar multisequence MR imaging of the abdomen was performed both before and after the administration of intravenous contrast. Heavily T2-weighted images of the biliary and pancreatic ducts were obtained, and three-dimensional MRCP images were rendered by post processing. CONTRAST:  82mL GADAVIST GADOBUTROL 1 MMOL/ML IV SOLN  COMPARISON:  CT 05/13/2022 FINDINGS: Lower chest: Tiny pleural effusions. There is some linear signal at the bases. Atelectasis is favored. This was seen previously. Hepatobiliary: There is no space-occupying liver lesion identified. Moderate central intrahepatic biliary ductal dilatation. The gallbladder is distended. There is some focal thickening  of the gallbladder towards the fundus with some cystic components consistent with areas of adenomyomatosis. Subtle gallbladder wall thickening. Common duct has a diameter of 15 mm. Slight tapering towards the pancreatic head. No obvious filling defect along the axial datasets. Somewhat abrupt transition at the ampulla. Pancreas: The pancreatic duct in the head and neck region is slightly ectatic with diameter of 4 mm. There is a small area of more normal caliber towards the neck and then another areas slight ectasia less of the head towards the proximal body. Duct in the tail is nondilated. Pancreas has some global mild atrophy the abnormal enhancement. The portal venous confluence and splenic vein are patent. No rim enhancing fluid collections. Mild adjacent inflammatory changes. Spleen:  Nonenlarged.  Preserved enhancement and T2 signal. Adrenals/Urinary Tract: Nonspecific thickening of the adrenal glands. No enhancing renal lesion. Stomach/Bowel: The visualized small and large bowel is nondilated. Once again there is large proximal duodenal diverticula. Vascular/Lymphatic: Normal caliber aorta and IVC. Mild atherosclerotic changes. No developing abnormal lymph node enlargement in the visualized abdomen. Other:  None. Musculoskeletal: Scattered degenerative changes along the spine. There is some compression deformity of the mid lumbar level, L3. This is unchanged from prior CT appears chronic. Slight changes at L1. IMPRESSION: Dilated common bile duct up to 15 mm with some moderate central intrahepatic duct dilatation. No obvious filling defect but slight abrupt transition at the ampulla. There is also a mild-to-moderately ectatic pancreatic duct without significant stricture. Subtle ampullary lesion is not excluded. No separate pancreatic mass. There is some mild peripancreatic stranding. Please correlate with the provided clinical history of pancreatitis. No well-defined fluid collections. Diffuse mild  gallbladder wall thickening with focal area with cystic change towards the fundus consistent with a component of adenomyomatosis. Tiny pleural effusions. Electronically Signed   By: Karen Kays M.D.   On: 05/14/2022 13:16   MR 3D Recon At Scanner  Result Date: 05/14/2022 CLINICAL DATA:  Pancreatitis. EXAM: MRI ABDOMEN WITHOUT AND WITH CONTRAST (INCLUDING MRCP) TECHNIQUE: Multiplanar multisequence MR imaging of the abdomen was performed both before and after the administration of intravenous contrast. Heavily T2-weighted images of the biliary and pancreatic ducts were obtained, and three-dimensional MRCP images were rendered by post processing. CONTRAST:  43mL GADAVIST GADOBUTROL 1 MMOL/ML IV SOLN COMPARISON:  CT 05/13/2022 FINDINGS: Lower chest: Tiny pleural effusions. There is some linear signal at the bases. Atelectasis is favored. This was seen previously. Hepatobiliary: There is no space-occupying liver lesion identified. Moderate central intrahepatic biliary ductal dilatation. The gallbladder is distended. There is some focal thickening of the gallbladder towards the fundus with some cystic components consistent with areas of adenomyomatosis. Subtle gallbladder wall thickening. Common duct has a diameter of 15 mm. Slight tapering towards the pancreatic head. No obvious filling defect along the axial datasets. Somewhat abrupt transition at the ampulla. Pancreas: The pancreatic duct in the head and neck region is slightly ectatic with diameter of 4 mm. There is a small area of more normal caliber towards the neck and then another areas slight ectasia less of the head towards the proximal body. Duct in the tail is nondilated. Pancreas has some global mild atrophy the abnormal enhancement. The portal venous confluence and splenic  vein are patent. No rim enhancing fluid collections. Mild adjacent inflammatory changes. Spleen:  Nonenlarged.  Preserved enhancement and T2 signal. Adrenals/Urinary Tract: Nonspecific  thickening of the adrenal glands. No enhancing renal lesion. Stomach/Bowel: The visualized small and large bowel is nondilated. Once again there is large proximal duodenal diverticula. Vascular/Lymphatic: Normal caliber aorta and IVC. Mild atherosclerotic changes. No developing abnormal lymph node enlargement in the visualized abdomen. Other:  None. Musculoskeletal: Scattered degenerative changes along the spine. There is some compression deformity of the mid lumbar level, L3. This is unchanged from prior CT appears chronic. Slight changes at L1. IMPRESSION: Dilated common bile duct up to 15 mm with some moderate central intrahepatic duct dilatation. No obvious filling defect but slight abrupt transition at the ampulla. There is also a mild-to-moderately ectatic pancreatic duct without significant stricture. Subtle ampullary lesion is not excluded. No separate pancreatic mass. There is some mild peripancreatic stranding. Please correlate with the provided clinical history of pancreatitis. No well-defined fluid collections. Diffuse mild gallbladder wall thickening with focal area with cystic change towards the fundus consistent with a component of adenomyomatosis. Tiny pleural effusions. Electronically Signed   By: Karen Kays M.D.   On: 05/14/2022 13:16   CT ABDOMEN PELVIS W CONTRAST  Result Date: 05/13/2022 CLINICAL DATA:  Acute severe pancreatitis. EXAM: CT ABDOMEN AND PELVIS WITH CONTRAST TECHNIQUE: Multidetector CT imaging of the abdomen and pelvis was performed using the standard protocol following bolus administration of intravenous contrast. RADIATION DOSE REDUCTION: This exam was performed according to the departmental dose-optimization program which includes automated exposure control, adjustment of the mA and/or kV according to patient size and/or use of iterative reconstruction technique. CONTRAST:  75mL OMNIPAQUE IOHEXOL 350 MG/ML SOLN COMPARISON:  No prior abdomen pelvis CT. A CTA chest is  available for review from 11/06/2016. CT studies of the pelvis without contrast are also available from 04/03/2022 and 04/19/2018, and also CT lumbar spine 04/03/2022. FINDINGS: Lower chest: Small layering right and trace left pleural effusions. There is mild compressive atelectatic effect in the lower lobes without infiltrates. Chronic mild elevation right hemidiaphragm. Lung bases are otherwise clear. The cardiac size is normal. Symmetric bilateral dense calcifications in both breasts most likely due to fibrocystic disease, alternatively could relate to prior breast implant removal. This was seen previously. Hepatobiliary: There is intrahepatic and extrahepatic biliary dilatation. The intrahepatic biliary dilatation is greatest in the left lobe. The common bile duct measuring up to 1.3 cm. There is no calcified intraductal stone or ductal mass. Etiology could be due to ampullary stricture, spasm or small ampullary mass although no mass is definitively seen. There is mild thickening of the gallbladder free wall and trace pericholecystic fluid. No calcified gallstone is seen. Findings could be congestive etiology, reactive due to adjacent pancreatitis, or acalculous cholecystitis. The liver is normal in size without mass enhancement. Pancreas: There are stranding changes along side the head, uncinate process and neck of the pancreas. Mild pancreatic ductal dilatation greater proximally. No pancreatic mass enhancement is seen. Findings consistent with acute interstitial pancreatitis. There is no peripancreatic hemorrhage, abscess or free air. There is trace nonlocalizing fluid inferior to the pancreas and posterior to the proximal pancreas. There is no focal pancreatic hypoenhancement concern for pancreatic necrosis. Inflammatory changes extend into the pancreaticoduodenal groove. Spleen: No abnormality. Adrenals/Urinary Tract: No adrenal or renal mass is seen. There is no hydronephrosis. There is contrast in the  collecting systems. Intrarenal stones could be obscured but there is no hydronephrosis no visible ureteral  filling defects. The left posterolateral bladder wall is obscured by spray artifact from a left hip replacement. The bladder wall is normal where visible. Stomach/Bowel: Thickened folds are again noted in the stomach similar to 2018, possible chronic gastritis. There is duodenal fold thickening which is probably reactive due to the pancreatitis. There is a 6.4 x 5 by 4.5 cm duodenal diverticulum at the junction of the descending and horizontal sections, with retained contrast. Rest of the small bowel is unremarkable and fills well with contrast. The appendix is normal. There is contrast in the appendix. No focal abnormality of the large bowel wall. Vascular/Lymphatic: Aortic atherosclerosis. No enlarged abdominal or pelvic lymph nodes. Reproductive: Status post hysterectomy. No adnexal masses. There are multiple pelvic phleboliths. Musculoskeletal: There is interval linear sclerotic healing response laterally in both sacral ala with the last pelvic CT showing acute sacral insufficiency fractures. Sacral fracture lines are still partially visible compared with 04/03/2022. There is additional linear healing response in the left pubic bone with left pubic fracture previously noted. No new fracture has become apparent. Chronic compression fractures of L1 and 3 are redemonstrated unchanged. Diffuse osteopenia is greater than expected for a 62 year old. Left hip replacement again noted. Mild degenerative change lumbar spine. Other: Small ascites in the presacral pelvis. No large drainable pocket. No free air, hemorrhage or abscess. There are no incarcerated hernias. There are scattered air pockets in the subcutaneous plane in the abdominal wall most likely reflecting sites of recent injection. Please correlate clinically. IMPRESSION: 1. Acute interstitial proximal pancreatitis without evidence of pancreatic necrosis,  hemorrhage or abscess. 2. There is intrahepatic and extrahepatic biliary dilatation, with the common bile duct measuring up to 1.3 cm. No calcified intraductal stone or mass is seen. Etiology could be due to ampullary stricture, spasm or small ampullary mass. 3. Mild gallbladder wall thickening and trace pericholecystic fluid. No calcified gallstone. Findings could be congestive, reactive due to adjacent pancreatitis, or from acalculous cholecystitis. 4. 6.4 x 5 x 4.5 cm duodenal diverticulum at the junction of the descending and horizontal portions. 5. Aortic atherosclerosis. 6. Small right and trace left pleural effusions. 7. Healing bilateral sacral insufficiency fractures and left pubic bone fracture. 8. Osteopenia and degenerative change. 9. Air pockets in the subcutaneous abdominal wall most likely due to recent injections. Aortic Atherosclerosis (ICD10-I70.0). Electronically Signed   By: Almira BarKeith  Chesser M.D.   On: 05/13/2022 21:18   DG Abd 1 View  Result Date: 05/12/2022 CLINICAL DATA:  62 year old female with vomiting EXAM: ABDOMEN - 1 VIEW COMPARISON:  None Available. FINDINGS: Nonspecific bowel gas pattern. Gas is noted throughout loops of small bowel without significant dilation. No air-fluid levels on this supine radiograph. No large free air. Left hip arthroplasty. IMPRESSION: Nonspecific bowel gas pattern with scattered air throughout small and large bowel. No obvious obstruction or free air. Electronically Signed   By: Malachy MoanHeath  McCullough M.D.   On: 05/12/2022 15:43   DG Chest Port 1 View  Result Date: 05/10/2022 CLINICAL DATA:  Altered mental status EXAM: PORTABLE CHEST 1 VIEW COMPARISON:  04/19/2018 FINDINGS: The lungs appear clear. Cardiac and mediastinal contours normal. No pleural effusion identified. Unchanged appearance of the right proximal humeral fixator. There is a suggestion of deformity of the right distal clavicle, likely from a remote healed fracture. IMPRESSION: 1. No active  cardiopulmonary disease is radiographically apparent. 2. Suspected remote healed fracture of the right distal clavicle. Electronically Signed   By: Gaylyn RongWalter  Liebkemann M.D.   On: 05/10/2022 15:59  CT Head Wo Contrast  Result Date: 05/10/2022 CLINICAL DATA:  Altered mental status EXAM: CT HEAD WITHOUT CONTRAST TECHNIQUE: Contiguous axial images were obtained from the base of the skull through the vertex without intravenous contrast. RADIATION DOSE REDUCTION: This exam was performed according to the departmental dose-optimization program which includes automated exposure control, adjustment of the mA and/or kV according to patient size and/or use of iterative reconstruction technique. COMPARISON:  CT head 11/05/16, MRI head 11/08/16 FINDINGS: Brain: No evidence of acute infarction, hemorrhage, hydrocephalus, extra-axial collection or mass lesion/mass effect. There is sequela of severe chronic microvascular ischemic change with multiple superimposed basal ganglia infarcts. 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 Skull: Normal. Negative for fracture or focal lesion. Sinuses/Orbits: Likely bilateral maxillary sinus mucous retention cysts. Paranasal sinuses are otherwise clear. Orbits are unremarkable. No mastoid or middle ear effusion. Other: None IMPRESSION: 1. 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 2. No intracranial hemorrhage. 3. Sequela of severe chronic microvascular ischemic change with multiple superimposed basal ganglia infarcts. Electronically Signed   By: Lorenza CambridgeHemant  Desai M.D.   On: 05/10/2022 15:14     Subjective: - no chest pain, shortness of breath  Discharge Exam: BP (!) 130/96 (BP Location: Left Arm)   Pulse 70   Temp 98 F (36.7 C) (Oral)   Resp 17   Ht 5\' 4"  (1.626 m)   Wt 59 kg   SpO2 98%   BMI 22.31 kg/m   General: Pt is alert, awake, not in acute  distress Cardiovascular: RRR, S1/S2 +, no rubs, no gallops Respiratory: CTA bilaterally, no wheezing, no rhonchi Abdominal: Soft, NT, ND, bowel sounds + Extremities: no edema, no cyanosis    The results of significant diagnostics from this hospitalization (including imaging, microbiology, ancillary and laboratory) are listed below for reference.     Microbiology: Recent Results (from the past 240 hour(s))  Urine Culture     Status: Abnormal   Collection Time: 05/10/22  4:39 PM   Specimen: Urine, Clean Catch  Result Value Ref Range Status   Specimen Description URINE, CLEAN CATCH  Final   Special Requests   Final    NONE Performed at [facility], [address] [place], [place] [address]    Culture (A)  Final    >=100,000 COLONIES/mL ESCHERICHIA COLI >=100,000 COLONIES/mL PROTEUS MIRABILIS    Report Status 05/13/2022 FINAL  Final   Organism ID, Bacteria ESCHERICHIA COLI (A)  Final   Organism ID, Bacteria PROTEUS MIRABILIS (A)  Final      Susceptibility   Escherichia coli - MIC*    AMPICILLIN <=2 SENSITIVE Sensitive     CEFAZOLIN <=4 SENSITIVE Sensitive     CEFEPIME <=0.12 SENSITIVE Sensitive     CEFTRIAXONE <=0.25 SENSITIVE Sensitive     CIPROFLOXACIN <=0.25 SENSITIVE Sensitive     GENTAMICIN <=1 SENSITIVE Sensitive     IMIPENEM <=0.25 SENSITIVE Sensitive     NITROFURANTOIN <=16 SENSITIVE Sensitive     TRIMETH/SULFA <=20 SENSITIVE Sensitive     AMPICILLIN/SULBACTAM <=2 SENSITIVE Sensitive     PIP/TAZO <=4 SENSITIVE Sensitive     * >=100,000 COLONIES/mL ESCHERICHIA COLI   Proteus mirabilis - MIC*    AMPICILLIN <=2 SENSITIVE Sensitive     CEFAZOLIN <=4 SENSITIVE Sensitive     CEFEPIME <=0.12 SENSITIVE Sensitive     CEFTRIAXONE <=0.25 SENSITIVE Sensitive     CIPROFLOXACIN <=0.25 SENSITIVE  Sensitive     GENTAMICIN <=1 SENSITIVE Sensitive     IMIPENEM 1 SENSITIVE Sensitive     NITROFURANTOIN 128 RESISTANT Resistant     TRIMETH/SULFA <=20 SENSITIVE Sensitive      AMPICILLIN/SULBACTAM <=2 SENSITIVE Sensitive     PIP/TAZO <=4 SENSITIVE Sensitive     * >=100,000 COLONIES/mL PROTEUS MIRABILIS     Labs: Basic Metabolic Panel: Recent Labs  Lab 05/14/22 0251 05/15/22 0309 05/16/22 0320 05/17/22 0447 05/18/22 0341  NA 136 135 136 139 140  K 4.1 4.2 3.3* 4.0 4.1  CL 102 101 102 107 104  CO2 25 23 26 28 29   GLUCOSE 51* 34* 116* 105* 98  BUN 8 7* 5* <5* 7*  CREATININE 0.65 0.69 0.60 0.69 0.72  CALCIUM 8.2* 8.8* 8.6* 8.4* 8.8*  MG 1.8 1.4* 1.5* 1.5* 1.4*   Liver Function Tests: Recent Labs  Lab 05/13/22 0227 05/14/22 0251 05/15/22 0309 05/16/22 0320 05/17/22 0447  AST 84* 43* 32 26 29  ALT 56* 37 30 24 26   ALKPHOS 160* 144* 149* 128* 121  BILITOT 0.7 0.6 0.8 0.6 0.4  PROT 5.8* 5.2* 5.6* 5.3* 5.2*  ALBUMIN 2.7* 2.5* 2.6* 2.5* 2.4*   CBC: Recent Labs  Lab 05/14/22 0251 05/15/22 0309 05/16/22 0320 05/17/22 0447 05/18/22 0341  WBC 7.5 6.6 6.7 6.6 6.9  NEUTROABS 4.2 3.7 3.0  --   --   HGB 12.0 12.0 12.2 12.0 12.4  HCT 36.7 36.9 36.2 35.8* 36.3  MCV 95.6 96.6 93.5 95.2 95.0  PLT 211 221 238 241 266   CBG: Recent Labs  Lab 05/17/22 2002 05/17/22 2300 05/18/22 0353 05/18/22 0857 05/18/22 1148  GLUCAP 125* 92 95 91 115*   Hgb A1c Recent Labs    05/18/22 0341  HGBA1C 5.5   Lipid Profile No results for input(s): "CHOL", "HDL", "LDLCALC", "TRIG", "CHOLHDL", "LDLDIRECT" in the last 72 hours. Thyroid function studies No results for input(s): "TSH", "T4TOTAL", "T3FREE", "THYROIDAB" in the last 72 hours.  Invalid input(s): "FREET3" Urinalysis    Component Value Date/Time   COLORURINE AMBER (A) 05/10/2022 1639   APPEARANCEUR CLOUDY (A) 05/10/2022 1639   LABSPEC 1.025 05/10/2022 1639   PHURINE 8.0 05/10/2022 1639   GLUCOSEU NEGATIVE 05/10/2022 1639   HGBUR NEGATIVE 05/10/2022 1639   BILIRUBINUR NEGATIVE 05/10/2022 1639   KETONESUR 20 (A) 05/10/2022 1639   PROTEINUR 100 (A) 05/10/2022 1639   NITRITE NEGATIVE 05/10/2022  1639   LEUKOCYTESUR LARGE (A) 05/10/2022 1639    FURTHER DISCHARGE INSTRUCTIONS:   Get Medicines reviewed and adjusted: Please take all your medications with you for your next visit with your Primary MD   Laboratory/radiological data: Please request your Primary MD to go over all hospital tests and procedure/radiological results at the follow up, please ask your Primary MD to get all Hospital records sent to his/her office.   In some cases, they will be blood work, cultures and biopsy results pending at the time of your discharge. Please request that your primary care M.D. goes through all the records of your hospital data and follows up on these results.   Also Note the following: If you experience worsening of your admission symptoms, develop shortness of breath, life threatening emergency, suicidal or homicidal thoughts you must seek medical attention immediately by calling 911 or calling your MD immediately  if symptoms less severe.   You must read complete instructions/literature along with all the possible adverse reactions/side effects for all the Medicines you take and that have been  prescribed to you. Take any new Medicines after you have completely understood and accpet all the possible adverse reactions/side effects.    Do not drive when taking Pain medications or sleeping medications (Benzodaizepines)   Do not take more than prescribed Pain, Sleep and Anxiety Medications. It is not advisable to combine anxiety,sleep and pain medications without talking with your primary care practitioner   Special Instructions: If you have smoked or chewed Tobacco  in the last 2 yrs please stop smoking, stop any regular Alcohol  and or any Recreational drug use.   Wear Seat belts while driving.   Please note: You were cared for by a hospitalist during your hospital stay. Once you are discharged, your primary care physician will handle any further medical issues. Please note that NO REFILLS for  any discharge medications will be authorized once you are discharged, as it is imperative that you return to your primary care physician (or establish a relationship with a primary care physician if you do not have one) for your post hospital discharge needs so that they can reassess your need for medications and monitor your lab values.  Time coordinating discharge: 40 minutes  SIGNED:  Pamella Pert, MD, PhD 05/18/2022, 1:43 PM

## 2022-05-19 NOTE — Progress Notes (Signed)
Late Entry- patient transported by [name] to [facility],patient requested her night time medication. Medication given and iv removed ,site clean dry and intact.

## 2022-05-22 ENCOUNTER — Encounter (HOSPITAL_COMMUNITY): Payer: Self-pay

## 2022-05-22 ENCOUNTER — Inpatient Hospital Stay (HOSPITAL_COMMUNITY)
Admission: EM | Admit: 2022-05-22 | Discharge: 2022-05-25 | DRG: 439 | Disposition: A | Discharge status: Skilled Nursing Facility | Payer: Medicare Other | Source: Skilled Nursing Facility | Attending: Internal Medicine | Admitting: Internal Medicine

## 2022-05-22 ENCOUNTER — Inpatient Hospital Stay (HOSPITAL_COMMUNITY): Payer: Medicare Other

## 2022-05-22 ENCOUNTER — Other Ambulatory Visit: Payer: Self-pay

## 2022-05-22 DIAGNOSIS — Z820 Family history of epilepsy and other diseases of the nervous system: Secondary | ICD-10-CM | POA: Diagnosis not present

## 2022-05-22 DIAGNOSIS — Z801 Family history of malignant neoplasm of trachea, bronchus and lung: Secondary | ICD-10-CM | POA: Diagnosis not present

## 2022-05-22 DIAGNOSIS — K858 Other acute pancreatitis without necrosis or infection: Secondary | ICD-10-CM

## 2022-05-22 DIAGNOSIS — D751 Secondary polycythemia: Secondary | ICD-10-CM | POA: Diagnosis present

## 2022-05-22 DIAGNOSIS — Z823 Family history of stroke: Secondary | ICD-10-CM | POA: Diagnosis not present

## 2022-05-22 DIAGNOSIS — K5710 Diverticulosis of small intestine without perforation or abscess without bleeding: Secondary | ICD-10-CM | POA: Diagnosis present

## 2022-05-22 DIAGNOSIS — Z96642 Presence of left artificial hip joint: Secondary | ICD-10-CM | POA: Diagnosis present

## 2022-05-22 DIAGNOSIS — Z8249 Family history of ischemic heart disease and other diseases of the circulatory system: Secondary | ICD-10-CM

## 2022-05-22 DIAGNOSIS — I10 Essential (primary) hypertension: Secondary | ICD-10-CM | POA: Diagnosis present

## 2022-05-22 DIAGNOSIS — K859 Acute pancreatitis without necrosis or infection, unspecified: Secondary | ICD-10-CM | POA: Diagnosis present

## 2022-05-22 DIAGNOSIS — K8500 Idiopathic acute pancreatitis without necrosis or infection: Secondary | ICD-10-CM | POA: Diagnosis present

## 2022-05-22 DIAGNOSIS — Z5982 Transportation insecurity: Secondary | ICD-10-CM | POA: Diagnosis not present

## 2022-05-22 DIAGNOSIS — R54 Age-related physical debility: Secondary | ICD-10-CM | POA: Diagnosis present

## 2022-05-22 DIAGNOSIS — G40909 Epilepsy, unspecified, not intractable, without status epilepticus: Secondary | ICD-10-CM | POA: Diagnosis present

## 2022-05-22 DIAGNOSIS — F17210 Nicotine dependence, cigarettes, uncomplicated: Secondary | ICD-10-CM | POA: Diagnosis present

## 2022-05-22 DIAGNOSIS — K838 Other specified diseases of biliary tract: Secondary | ICD-10-CM | POA: Diagnosis not present

## 2022-05-22 DIAGNOSIS — E785 Hyperlipidemia, unspecified: Secondary | ICD-10-CM | POA: Diagnosis present

## 2022-05-22 DIAGNOSIS — Z885 Allergy status to narcotic agent status: Secondary | ICD-10-CM

## 2022-05-22 DIAGNOSIS — M810 Age-related osteoporosis without current pathological fracture: Secondary | ICD-10-CM | POA: Diagnosis present

## 2022-05-22 DIAGNOSIS — Q780 Osteogenesis imperfecta: Secondary | ICD-10-CM | POA: Diagnosis not present

## 2022-05-22 DIAGNOSIS — R569 Unspecified convulsions: Secondary | ICD-10-CM

## 2022-05-22 DIAGNOSIS — F1020 Alcohol dependence, uncomplicated: Secondary | ICD-10-CM | POA: Diagnosis present

## 2022-05-22 DIAGNOSIS — Z79899 Other long term (current) drug therapy: Secondary | ICD-10-CM | POA: Diagnosis not present

## 2022-05-22 LAB — LIPASE, BLOOD: Lipase: 2099 U/L — ABNORMAL HIGH (ref 11–51)

## 2022-05-22 LAB — URINALYSIS, ROUTINE W REFLEX MICROSCOPIC
Bilirubin Urine: NEGATIVE
Glucose, UA: NEGATIVE mg/dL
Hgb urine dipstick: NEGATIVE
Ketones, ur: 5 mg/dL — AB
Nitrite: NEGATIVE
Protein, ur: 30 mg/dL — AB
Specific Gravity, Urine: 1.016 (ref 1.005–1.030)
pH: 8 (ref 5.0–8.0)

## 2022-05-22 LAB — CBC WITH DIFFERENTIAL/PLATELET
Abs Immature Granulocytes: 0.02 10*3/uL (ref 0.00–0.07)
Basophils Absolute: 0 10*3/uL (ref 0.0–0.1)
Basophils Relative: 1 %
Eosinophils Absolute: 0.1 10*3/uL (ref 0.0–0.5)
Eosinophils Relative: 1 %
HCT: 50.4 % — ABNORMAL HIGH (ref 36.0–46.0)
Hemoglobin: 16.5 g/dL — ABNORMAL HIGH (ref 12.0–15.0)
Immature Granulocytes: 0 %
Lymphocytes Relative: 15 %
Lymphs Abs: 1.3 10*3/uL (ref 0.7–4.0)
MCH: 31.4 pg (ref 26.0–34.0)
MCHC: 32.7 g/dL (ref 30.0–36.0)
MCV: 95.8 fL (ref 80.0–100.0)
Monocytes Absolute: 0.5 10*3/uL (ref 0.1–1.0)
Monocytes Relative: 6 %
Neutro Abs: 6.8 10*3/uL (ref 1.7–7.7)
Neutrophils Relative %: 77 %
Platelets: 275 10*3/uL (ref 150–400)
RBC: 5.26 MIL/uL — ABNORMAL HIGH (ref 3.87–5.11)
RDW: 12.8 % (ref 11.5–15.5)
WBC: 8.7 10*3/uL (ref 4.0–10.5)
nRBC: 0 % (ref 0.0–0.2)

## 2022-05-22 LAB — COMPREHENSIVE METABOLIC PANEL
ALT: 115 U/L — ABNORMAL HIGH (ref 0–44)
AST: 199 U/L — ABNORMAL HIGH (ref 15–41)
Albumin: 3.6 g/dL (ref 3.5–5.0)
Alkaline Phosphatase: 285 U/L — ABNORMAL HIGH (ref 38–126)
Anion gap: 11 (ref 5–15)
BUN: 11 mg/dL (ref 8–23)
CO2: 28 mmol/L (ref 22–32)
Calcium: 9.5 mg/dL (ref 8.9–10.3)
Chloride: 97 mmol/L — ABNORMAL LOW (ref 98–111)
Creatinine, Ser: 0.89 mg/dL (ref 0.44–1.00)
GFR, Estimated: >60 mL/min (ref >60)
Glucose, Bld: 135 mg/dL — ABNORMAL HIGH (ref 70–99)
Potassium: 4.5 mmol/L (ref 3.5–5.1)
Sodium: 136 mmol/L (ref 135–145)
Total Bilirubin: 0.8 mg/dL (ref 0.3–1.2)
Total Protein: 7.6 g/dL (ref 6.5–8.1)

## 2022-05-22 MED ORDER — BACLOFEN 5 MG HALF TABLET: 5.0000 mg | ORAL_TABLET | Freq: Three times a day (TID) | ORAL | Status: DC | PRN

## 2022-05-22 MED ORDER — SODIUM CHLORIDE 0.9 % IV BOLUS
1000.0000 mL | Freq: Once | INTRAVENOUS | Status: AC
  Administered 2022-05-22: 1000 mL via INTRAVENOUS

## 2022-05-22 MED ORDER — IOHEXOL 9 MG/ML PO SOLN
500.0000 mL | ORAL | Status: AC
  Administered 2022-05-22 (×2): 500 mL via ORAL

## 2022-05-22 MED ORDER — IOHEXOL 350 MG/ML SOLN
75.0000 mL | Freq: Once | INTRAVENOUS | Status: AC | PRN
  Administered 2022-05-22: 75 mL via INTRAVENOUS

## 2022-05-22 MED ORDER — HYDROMORPHONE HCL 1 MG/ML IJ SOLN
1.0000 mg | Freq: Once | INTRAMUSCULAR | Status: AC
  Administered 2022-05-22: 1 mg via INTRAVENOUS
  Filled 2022-05-22: qty 1

## 2022-05-22 MED ORDER — ONDANSETRON HCL 4 MG PO TABS
4.0000 mg | ORAL_TABLET | Freq: Four times a day (QID) | ORAL | Status: DC | PRN
  Administered 2022-05-25: 4 mg via ORAL
  Filled 2022-05-22: qty 1

## 2022-05-22 MED ORDER — LEVETIRACETAM 500 MG PO TABS
500.0000 mg | ORAL_TABLET | Freq: Two times a day (BID) | ORAL | Status: DC
  Administered 2022-05-22 – 2022-05-25 (×7): 500 mg via ORAL
  Filled 2022-05-22 (×7): qty 1

## 2022-05-22 MED ORDER — MAGNESIUM 30 MG PO TABS: 30.0000 mg | ORAL_TABLET | Freq: Two times a day (BID) | ORAL | Status: DC

## 2022-05-22 MED ORDER — OXYCODONE HCL 5 MG PO TABS
5.0000 mg | ORAL_TABLET | Freq: Three times a day (TID) | ORAL | Status: DC
  Administered 2022-05-22 – 2022-05-24 (×6): 5 mg via ORAL
  Filled 2022-05-22 (×6): qty 1

## 2022-05-22 MED ORDER — ACETAMINOPHEN 500 MG PO TABS
1000.0000 mg | ORAL_TABLET | Freq: Three times a day (TID) | ORAL | Status: DC
  Administered 2022-05-22 – 2022-05-25 (×10): 1000 mg via ORAL
  Filled 2022-05-22 (×11): qty 2

## 2022-05-22 MED ORDER — LACTATED RINGERS IV SOLN: INTRAVENOUS | Status: DC

## 2022-05-22 MED ORDER — IOHEXOL 12 MG/ML PO SOLN: 500.0000 mL | ORAL | Status: DC

## 2022-05-22 MED ORDER — THIAMINE HCL 100 MG PO TABS: 100.0000 mg | ORAL_TABLET | Freq: Every day | ORAL | Status: DC

## 2022-05-22 MED ORDER — FOLIC ACID 1 MG PO TABS
1.0000 mg | ORAL_TABLET | Freq: Every day | ORAL | Status: DC
  Administered 2022-05-22 – 2022-05-25 (×4): 1 mg via ORAL
  Filled 2022-05-22 (×4): qty 1

## 2022-05-22 MED ORDER — ONDANSETRON HCL 4 MG/2ML IJ SOLN
4.0000 mg | Freq: Four times a day (QID) | INTRAMUSCULAR | Status: DC | PRN
  Administered 2022-05-22: 4 mg via INTRAVENOUS
  Filled 2022-05-22: qty 2

## 2022-05-22 MED ORDER — HYDRALAZINE HCL 20 MG/ML IJ SOLN
5.0000 mg | INTRAMUSCULAR | Status: DC | PRN
  Administered 2022-05-25: 5 mg via INTRAVENOUS
  Filled 2022-05-22: qty 1

## 2022-05-22 MED ORDER — CALCITONIN (SALMON) 200 UNIT/ACT NA SOLN
1.0000 | Freq: Every day | NASAL | Status: DC
  Administered 2022-05-22 – 2022-05-25 (×4): 1 via NASAL
  Filled 2022-05-22: qty 3.7

## 2022-05-22 MED ORDER — THIAMINE MONONITRATE 100 MG PO TABS
100.0000 mg | ORAL_TABLET | Freq: Every day | ORAL | Status: DC
  Administered 2022-05-22 – 2022-05-25 (×4): 100 mg via ORAL
  Filled 2022-05-22 (×4): qty 1

## 2022-05-22 MED ORDER — MORPHINE SULFATE (PF) 2 MG/ML IV SOLN
2.0000 mg | INTRAVENOUS | Status: DC | PRN
  Administered 2022-05-22 – 2022-05-24 (×5): 2 mg via INTRAVENOUS
  Filled 2022-05-22 (×5): qty 1

## 2022-05-22 NOTE — H&P (Signed)
History and Physical    Patient: Rose Bridges EXB:[id number] DOB: November 24, 1960 DOA: 05/22/2022 DOS: the patient was seen and examined on 05/22/2022 PCP: Cain Saupe, MD  Patient coming from: SNF - [facility]; NOK: Augustina Mood, [phone]   Chief Complaint: Abdominal pain  HPI: Rose Bridges is a 62 y.o. female with medical history significant of HTN, HLD, and osteogenesis imperfecta presenting with abdominal pain.  She was last admitted from 1/11-19 with E. Coli/Proteus UTI, treated with Rocephin.  While hospitalized she developed AMS that was thought to be hyperactive delirium.  She also developed acute pancreatitis; she was planned for EUS as an outpatient (3/7) but appears to need this now instead.  She went to the facility and she was placed on mechanical diet, very salty.  When she was there the first time, she "went on strike" and refused food, meds.  She didn't have pain pills until Sunday, she was in agony.  She developed abdominal pain last night.   She vomited x 5, unable to stop.  She had an acid taste in her mouth.  The pain is better now, some relief but still hurts and worse with breathing and hiccups.  No further vomiting in the ER.    ER Course:  Recent dc - UTI, pancreatitis.  Here with epigastric pain, n/v.  Lipase 2099, elevated LFTs.  Has recurrent pancreatitis.     Review of Systems: As mentioned in the history of present illness. All other systems reviewed and are negative. Past Medical History:  Diagnosis Date   Hyperlipemia    Hypertension    Osteogenesis imperfecta    Osteoporosis    Past Surgical History:  Procedure Laterality Date   ABDOMINAL HYSTERECTOMY     HIP ARTHROPLASTY Left 09/26/2015   Procedure: ARTHROPLASTY BIPOLAR HIP (HEMIARTHROPLASTY);  Surgeon: Sheral Apley, MD;  Location: [facility] OR;  Service: Orthopedics;  Laterality: Left;   ORIF HUMERUS FRACTURE Right 11/06/2016   Procedure: OPEN REDUCTION INTERNAL  FIXATION (ORIF) PROXIMAL HUMERUS FRACTURE;  Surgeon: Sheral Apley, MD;  Location: [facility] OR;  Service: Orthopedics;  Laterality: Right;   Social History:  reports that she has been smoking. She has been smoking an average of .25 packs per day. She has never used smokeless tobacco. She reports that she does not drink alcohol and does not use drugs.  Allergies  Allergen Reactions   Codeine Other (See Comments)    Migraine     Family History  Problem Relation Age of Onset   Stroke Mother    Heart attack Mother    Hypertension Mother    Stroke Sister    Stroke Brother    Lung cancer Father     Prior to Admission medications   Medication Sig Start Date End Date Taking? Authorizing Provider  acetaminophen (TYLENOL) 500 MG tablet Take 1,000 mg by mouth 3 (three) times daily. 04/10/22   [provider]  Baclofen 5 MG TABS Take 1 tablet by mouth every 8 (eight) hours as needed for muscle spasms. 04/07/22   Celine Mans, MD  calcitonin, salmon, (MIACALCIN/FORTICAL) 200 UNIT/ACT nasal spray Place 1 spray into alternate nostrils daily. 04/08/22   Celine Mans, MD  calcium carbonate (TUMS - DOSED IN MG ELEMENTAL CALCIUM) 500 MG chewable tablet Chew 1 tablet by mouth 2 (two) times daily.    [provider]  folic acid (FOLVITE) 1 MG tablet Take 1 tablet (1 mg total) by mouth daily. 09/09/19   Storm Frisk, MD  levETIRAcetam (  KEPPRA) 500 MG tablet Take 1 tablet (500 mg total) by mouth 2 (two) times daily. 05/18/22   Caren Griffins, MD  magnesium 30 MG tablet Take 1 tablet (30 mg total) by mouth 2 (two) times daily. 05/18/22   Caren Griffins, MD  oxyCODONE (OXY IR/ROXICODONE) 5 MG immediate release tablet Take 1 tablet (5 mg total) by mouth every 8 (eight) hours. 05/18/22   Caren Griffins, MD  thiamine 100 MG tablet Take 1 tablet (100 mg total) by mouth daily. 09/09/19   Elsie Stain, MD  Vitamin D, Ergocalciferol, (DRISDOL) 1.25 MG (50000 UNIT) CAPS capsule  Take 1 capsule (50,000 Units total) by mouth every 7 (seven) days. Patient taking differently: Take 50,000 Units by mouth every 7 (seven) days. Thursdays 04/12/22   Salvadore Oxford, MD    Physical Exam: Vitals:   05/22/22 0850 05/22/22 0940 05/22/22 1202 05/22/22 1459  BP:  119/71 107/73 110/68  Pulse: 82 79 82 79  Resp: 18 18 18 18   Temp: 98.2 F (36.8 C) 98.3 F (36.8 C) 98.4 F (36.9 C) 98.7 F (37.1 C)  TempSrc: Oral Oral Oral Oral  SpO2: 96% 97% 99% 97%  Weight:      Height:       General:  Appears calm and comfortable and is in NAD Eyes:  EOMI, normal lids, iris ENT:  grossly normal hearing, lips & tongue, mildly to moderately dry mm Neck:  no LAD, masses or thyromegaly Cardiovascular:  RRR, no m/r/g. No LE edema.  Respiratory:   CTA bilaterally with no wheezes/rales/rhonchi.  Normal respiratory effort. Abdomen:  soft, mildly diffusely tender, ND, hypoactive but present BS Skin:  no rash or induration seen on limited exam Musculoskeletal:  grossly normal tone BUE/BLE, good ROM, no bony abnormality Psychiatric:  eccentric mood and affect, speech fluent and appropriate, AOx3 Neurologic:  CN 2-12 grossly intact, moves all extremities in coordinated fashion   Radiological Exams on Admission: Independently reviewed - see discussion in A/P where applicable  No results found.  EKG: Independently reviewed.   5 - NSR with rate 72; no evidence of acute ischemia 0250 - NSR with rate 77; no evidence of acute ischemia   Labs on Admission: I have personally reviewed the available labs and imaging studies at the time of the admission.  Pertinent labs:    Glucose 135 AP 285 Lipase 2099 AST 199/ALT 115 WBC 8.7 Hgb 16.5 UA: 5 ketones, small LE, 30 protein, many bacteria   Assessment and Plan: Principal Problem:   Acute pancreatitis without infection or necrosis Active Problems:   Seizure ([place])   Osteogenesis imperfecta   Alcohol dependence ([place])    Acute  pancreatitis, recurrent -Patient with prior h/o acute pancreatitis during her last admission (4 days PTA) presenting with similar symptoms - abdominal pain, n/v -Now with frank pancreatitis by H&P, mildly elevated lipase -There was concern for CBD dilatation and large duodenal diverticulum during her last hospitalization; she was scheduled for outpatient EUS but appears to now need this as an inpatient instead  -Will admit to med surg -Strict NPO ordered but GI wrote for clear liquids -Aggressive IVF hydration at least for the first 12 hours with LR at 200 cc/hr -Pain control with morphine 2 mg q2h prn and PO Oxy  -Nausea control with Zofran -GI is consulting  -There appears to be no inpatient EUS capability at this time and she may require transfer outside of our system according to GI note from this afternoon -For  now, will treat current flare and await further input from GI -GI ordered CT for further evaluation  Seizure d/o -Continue Keppra -Seizure precautions  Osteogenesis imperfecta -Outpatient f/u -Currently in SNF due to alar fracture on 12/5  ETOH dependence -DIL reports h/o heavy ETOH use -Her DIL is uncertain if someone is bringing her ETOH into her facility - which could also potentially explain her delirium during prior hospitalization -Her DIL will try to discuss with patient further and may try to check at the facility to see if ETOH is there -If she develops delirium during current hospitalization, would add CIWA     Advance Care Planning:   Code Status: Full Code - Code status was discussed with the patient and/or family at the time of admission.  The patient would want to receive full resuscitative measures at this time.   Consults: GI  DVT Prophylaxis: SCDs  Family Communication: None present; I spoke with her sister-in-law by telephone the evening of admission  Severity of Illness: The appropriate patient status for this patient is INPATIENT. Inpatient  status is judged to be reasonable and necessary in order to provide the required intensity of service to ensure the patient's safety. The patient's presenting symptoms, physical exam findings, and initial radiographic and laboratory data in the context of their chronic comorbidities is felt to place them at high risk for further clinical deterioration. Furthermore, it is not anticipated that the patient will be medically stable for discharge from the hospital within 2 midnights of admission.   * I certify that at the point of admission it is my clinical judgment that the patient will require inpatient hospital care spanning beyond 2 midnights from the point of admission due to high intensity of service, high risk for further deterioration and high frequency of surveillance required.*  Author: Karmen Bongo, MD 05/22/2022 6:44 PM  For on call review [email or web address].

## 2022-05-22 NOTE — Progress Notes (Addendum)
[facility] Consult: 10:02 AM 05/22/2022  LOS: 0 days    Referring Provider: Dr Basil Dess.    Primary Care Physician:  Cain Saupe, MD Primary Gastroenterologist:  inpt only.  Dr Adela Lank.  Previously no GI MD.  Has march fup w Dr Meridee Score.       Reason for Consultation: Recurrent acute abd pain, N/V in pt w recent admission for acute, idiopathic pancreatitis.   HPI: Rose Bridges is a 62 y.o. female.  PMH osteogenesis imperfecta particularly affecting the jaw.  Multiple jaw/dental surgeries.  Osteoporosis.  Hypertension.  Hyperlipidemia. Surgeries include ORIF of humerus fracture, left hip arthroplasty, abdominal hysterectomy. Previous colonoscopies on 2 occasion but these were 30 or more years ago, sounds like she may have had IBS.    12/5 -04/07/22 admission to address fracture of the pelvis from a mechanical fall.  Ortho manage this nonoperatively.  Noted to have polycythemia with Hgb up to 17.2.  Discharged to SNF on intranasal calcitonin.  1/11 -05/18/2022 admission with E. coli/Proteus UTI treated with Rocephin.  Then developed acute pancreatitis, acute pain and nausea, vomiting.  These symptoms began after she began antibiotics for her UTI.  Lipase to 2337.  Other issues included delirium, anxiety. CTAP w contrast: Acute pancreatitis with stranding at the head, uncinate and pancreatic neck.  Mild PD dilatation proximally.  No pancreatic mass.  CBD measures up to 1.3 cm but no evidence for CBD stone or extrinsic mass.  Mild gallbladder wall thickening, trace pericholecystic fluid but no evidence for gallstones, sludge.  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 MRCP: CBD dilated to 15 mm central intrahepatic ductal dilatation.   Filling defect ago there was slight, abrupt transition at the ampulla.  Mild to moderately ectatic PD but no significant PD stricture.  Subtle, ampullary lesion cannot be excluded masses visualized.  Mild peripancreatic stranding.  No well-defined fluid collections.  Diffuse thickening GB wall with focal cystic changes near the fundus consistent with GB adenomyomatosis.  Initially had issues with pain management but eventually improved and was tolerating p.o. intake without issues.  Wanted to go home.  Indeterminate etiology for the pancreatitis.  Improvement in lipase to 96. Normalization of transaminases, alkaline phosphatase.  T. bili was never elevated though it had gone as high as 1.7 on 04/04/2022 when she was admitted with her bone issues.  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  Another etiology was inpt treatment with ceftriaxone.  She had follow-up outpatient EUS with Dr. Meridee Score set for 07/05/22.   Discharged back to SNF where she refused food, meds "went on strike".  Said the food was too salty and she disliked the soft textured diet.  She was however pain and nausea free until yesterday evening when she developed recurrent acute abdominal pain in the left upper quadrant along with 5 episodes of nonbloody nausea, vomiting.  Symptoms similar to when she developed pancreatitis.  Pain worse with deep inspiration.  Was returned to the ED. Currently pain is  controlled with meds and she has not had any recurrent nausea or vomiting. Lipase 2099.  T. bili normal.  Alk phos 285.  AST/ALT 199/115.  BUN/creatinine okay.  Glucose 135.  WBCs normal.  Hb 16.5.  Urinalysis shows many bacteria but only 0-5 WBCs and small leukocytes, no nitrites. Has not had repeated CT imaging, MRI or ultrasound.  Up until she discharged to the SNF 7 weeks ago, smoked  4 cigarettes a day but previous 1ppd hx.  Before discharging to SNF, was living at her mother's home and providing home care for  her mother who is in her late 45s. Family history of Alzheimer's dementia, MI, multiple strokes in her mother.  Her father died with metastatic lung cancer.    Past Medical History:  Diagnosis Date   Hyperlipemia    Hypertension    Osteogenesis imperfecta    Osteoporosis     Past Surgical History:  Procedure Laterality Date   ABDOMINAL HYSTERECTOMY     HIP ARTHROPLASTY Left 09/26/2015   Procedure: ARTHROPLASTY BIPOLAR HIP (HEMIARTHROPLASTY);  Surgeon: Renette Butters, MD;  Location: [facility];  Service: Orthopedics;  Laterality: Left;   ORIF HUMERUS FRACTURE Right 11/06/2016   Procedure: OPEN REDUCTION INTERNAL FIXATION (ORIF) PROXIMAL HUMERUS FRACTURE;  Surgeon: Renette Butters, MD;  Location: [place];  Service: Orthopedics;  Laterality: Right;    Prior to Admission medications   Medication Sig Start Date End Date Taking? Authorizing Provider  acetaminophen (TYLENOL) 500 MG tablet Take 1,000 mg by mouth 3 (three) times daily. 04/10/22  Yes [provider]  Baclofen 5 MG TABS Take 1 tablet by mouth every 8 (eight) hours as needed for muscle spasms. 04/07/22  Yes Salvadore Oxford, MD  calcitonin, salmon, (MIACALCIN/FORTICAL) 200 UNIT/ACT nasal spray Place 1 spray into alternate nostrils daily. 04/08/22  Yes Salvadore Oxford, MD  calcium carbonate (TUMS - DOSED IN MG ELEMENTAL CALCIUM) 500 MG chewable tablet Chew 1 tablet by mouth 2 (two) times daily.   Yes [provider]  folic acid (FOLVITE) 1 MG tablet Take 1 tablet (1 mg total) by mouth daily. 09/09/19  Yes Elsie Stain, MD  levETIRAcetam (KEPPRA) 500 MG tablet Take 1 tablet (500 mg total) by mouth 2 (two) times daily. 05/18/22  Yes Caren Griffins, MD  magnesium 30 MG tablet Take 1 tablet (30 mg total) by mouth 2 (two) times daily. 05/18/22  Yes Gherghe, Vella Redhead, MD  oxyCODONE (OXY IR/ROXICODONE) 5 MG immediate release tablet Take 1 tablet (5 mg total) by mouth every 8 (eight) hours. 05/18/22  Yes Caren Griffins,  MD  thiamine 100 MG tablet Take 1 tablet (100 mg total) by mouth daily. 09/09/19  Yes Elsie Stain, MD  Vitamin D, Ergocalciferol, (DRISDOL) 1.25 MG (50000 UNIT) CAPS capsule Take 1 capsule (50,000 Units total) by mouth every 7 (seven) days. Patient taking differently: Take 50,000 Units by mouth every 7 (seven) days. Thursdays 04/12/22  Yes Salvadore Oxford, MD    Scheduled Meds:  Infusions:  PRN Meds:    Allergies as of 05/22/2022 - Review Complete 05/22/2022  Allergen Reaction Noted   Codeine Other (See Comments) 09/25/2015    Family History  Problem Relation Age of Onset   Stroke Mother    Heart attack Mother    Hypertension Mother    Stroke Sister    Stroke Brother    Lung cancer Father     Social History   Socioeconomic History   Marital status: Divorced  Spouse name: Not on file   Number of children: 1   Years of education: College   Highest education level: Not on file  Occupational History   Occupation: Film/video editor  Tobacco Use   Smoking status: Every Day    Packs/day: 0.25    Types: Cigarettes   Smokeless tobacco: Never  Vaping Use   Vaping Use: Never used  Substance and Sexual Activity   Alcohol use: No    Alcohol/week: 85.0 standard drinks of alcohol    Types: 85 Shots of liquor per week    Comment: No alcohol use since 11/04/16.   Drug use: No   Sexual activity: Not Currently  Other Topics Concern   Not on file  Social History Narrative   Pt lives with her mother.   Right-handed.   1-2 bottles (16Oz each) of [facility] or Dr. Reino Kent.   Occasionally drinks Monster drinks.   Social Determinants of Health   Financial Resource Strain: Not on file  Food Insecurity: No Food Insecurity (05/10/2022)   Hunger Vital Sign    Worried About Running Out of Food in the Last Year: Never true    Ran Out of Food in the Last Year: Never true  Transportation Needs: Unmet Transportation Needs (05/10/2022)   PRAPARE - Scientist, research (physical sciences) (Medical): Yes    Lack of Transportation (Non-Medical): Yes  Physical Activity: Not on file  Stress: Not on file  Social Connections: Not on file  Intimate Partner Violence: Not At Risk (05/10/2022)   Humiliation, Afraid, Rape, and Kick questionnaire    Fear of Current or Ex-Partner: No    Emotionally Abused: No    Physically Abused: No    Sexually Abused: No    REVIEW OF SYSTEMS: Constitutional: No profound weakness or fatigue. ENT:  No nose bleeds Pulm: Denies shortness of breath and cough.  Just taking shallow breaths because deep breaths trigger upper abdominal pain CV:  No palpitations, no LE edema.  GU:  No hematuria, no frequency GI: See HPI. Heme: No unusual bleeding or bruising Transfusions: None Neuro:  No headaches, no peripheral tingling or numbness.  No syncope.  No seizures. Musculoskeletal: Persistent though improving jaw and neck pain. Derm:  No itching, no rash or sores.  Endocrine:  No sweats or chills.  No polyuria or dysuria Immunization: Reviewed. Travel:  None beyond local counties in last few months.    PHYSICAL EXAM: Vital signs in last 24 hours: Vitals:   05/22/22 0850 05/22/22 0940  BP:  119/71  Pulse: 82 79  Resp: 18 18  Temp: 98.2 F (36.8 C) 98.3 F (36.8 C)  SpO2: 96% 97%   Wt Readings from Last 3 Encounters:  05/22/22 59 kg  05/10/22 59 kg  04/03/22 58 kg    General: Patient is thin and looks chronically ill but she is alert and comfortable in the bed.  Nontoxic Head: No facial asymmetry or swelling.  No signs of head trauma. Eyes: No conjunctival pallor or scleral icterus. Ears: Not hard of hearing Nose: No congestion or discharge. Mouth: Oral mucosa slightly dry but clear.  Tongue midline. Neck: No JVD, thyromegaly or masses. Lungs: Soft breath sounds due to poor inspiratory effort but no adventitious sounds.  No dyspnea or cough. Heart: RRR.  No MRG.  S1, S2 present Abdomen: Soft, tender throughout.  In fact even  with the status of the scope laying on her belly she was complaining of pain.  No guarding  or rebound.  No HSM, masses, bruits, hernias.   Rectal: Deferred. Musc/Skeltl: No joint redness, swelling or joint deformity. Extremities: No CCE. Neurologic: Oriented x 3.  Moves all 4 limbs.  No tremors.  Strength not tested. Skin: No rash, no sores, no significant bruising.  Nose suspicious lesions   Psych: Anxious, cooperative.  Fluid speech.  Intake/Output from previous day: 01/22 0701 - 01/23 0700 In: 1000 [IV Piggyback:1000] Out: -  Intake/Output this shift: No intake/output data recorded.  LAB RESULTS: Recent Labs    05/22/22 0200  WBC 8.7  HGB 16.5*  HCT 50.4*  PLT 275   BMET Lab Results  Component Value Date   NA 136 05/22/2022   NA 140 05/18/2022   NA 139 05/17/2022   K 4.5 05/22/2022   K 4.1 05/18/2022   K 4.0 05/17/2022   CL 97 (L) 05/22/2022   CL 104 05/18/2022   CL 107 05/17/2022   CO2 28 05/22/2022   CO2 29 05/18/2022   CO2 28 05/17/2022   GLUCOSE 135 (H) 05/22/2022   GLUCOSE 98 05/18/2022   GLUCOSE 105 (H) 05/17/2022   BUN 11 05/22/2022   BUN 7 (L) 05/18/2022   BUN <5 (L) 05/17/2022   CREATININE 0.89 05/22/2022   CREATININE 0.72 05/18/2022   CREATININE 0.69 05/17/2022   CALCIUM 9.5 05/22/2022   CALCIUM 8.8 (L) 05/18/2022   CALCIUM 8.4 (L) 05/17/2022   LFT Recent Labs    05/22/22 0200  PROT 7.6  ALBUMIN 3.6  AST 199*  ALT 115*  ALKPHOS 285*  BILITOT 0.8   PT/INR Lab Results  Component Value Date   INR 0.95 09/25/2015   Hepatitis Panel No results for input(s): "HEPBSAG", "HCVAB", "HEPAIGM", "HEPBIGM" in the last 72 hours. C-Diff No components found for: "CDIFF" Lipase     Component Value Date/Time   LIPASE 2,099 (H) 05/22/2022 0200    Drugs of Abuse     Component Value Date/Time   LABOPIA NONE DETECTED 05/10/2022 1639   COCAINSCRNUR NONE DETECTED 05/10/2022 1639   LABBENZ NONE DETECTED 05/10/2022 1639   AMPHETMU NONE DETECTED  05/10/2022 1639   THCU NONE DETECTED 05/10/2022 1639   LABBARB NONE DETECTED 05/10/2022 1639     RADIOLOGY STUDIES: No results found.    IMPRESSION:   Recurrent pain and recurrent elevated lipase in patient with recent diagnosis of idiopathic acute pancreatitis, discharged just a few days ago.  Source for pancreatitis was not alcoholic.  Possibility of ceftriaxone induced pancreatitis or pancreatitis due to anatomy/duodenal diverticulum.  EUS had been arranged with Dr. Rush Landmark.  With current labs, her LFTs are significantly elevated beyond last weeks mild elevation.    PLAN:     Repeat CT scan with contrast.  Diet as tolerated.  Ordered clear liquids to start with.  Start IV fluids with LR at 150 mL/hour, none are currently running though she did receive 1 L bolus normal saline early this morning.   Azucena Freed  05/22/2022, 10:02 AM Phone [phone]   Attending physician's note  I have taken a history, reviewed the chart and examined the patient. I performed a substantive portion of this encounter, including complete performance of at least one of the key components, in conjunction with the APP. I agree with the APP's note, impression and recommendations.    62 year old female hospitalized in December s/p mechanical fall with pelvic fracture subsequent hospitalization with acute pancreatitis, noted dilated CBD to 15 mm with intrahepatic duct dilation, abrupt transition at ampulla, no  definite choledocholithiasis, cannot exclude ampullary lesion  She is scheduled for outpatient EUS/ERCP with Dr. Meridee Score in March 2024  Patient is admitted with recurrent abdominal pain, nausea, vomiting consistent with recurrent episode of acute pancreatitis Continue supportive care with IV fluids, pain management Clears as tolerated Repeat CT abdomen with contrast, patient has claustrophobia and does not want to go through MRI  Unfortunately Dr. Meridee Score does not have availability on  his schedule to do EUS or ERCP anytime sooner, will need to consider referring patient outside the system if cannot wait until then  The patient was provided an opportunity to ask questions and all were answered. The patient agreed with the plan and demonstrated an understanding of the instructions.  Iona Beard , MD [phone]

## 2022-05-22 NOTE — ED Provider Notes (Signed)
[facility] Provider Note   CSN: [id number] Arrival date & time: 05/22/22  0140     History  Chief Complaint  Patient presents with   Abdominal Pain    Rose Bridges is a 62 y.o. female, history of osteogenesis imperfecta and PCOS, presented with epigastric pain that began at 2100 last night.  Patient states this is 10 out of 10 pain that does not radiate.  Movement makes the pain worse and patient denied any alleviating factors.  Patient also endorsed having 5 episodes of watery emesis but is no longer nauseous.  Patient states that taking a deep breath causes her belly pain to worsen as well.  Patient denies chest pain, dysuria, syncope, hematemesis  Home Medications Prior to Admission medications   Medication Sig Start Date End Date Taking? Authorizing Provider  acetaminophen (TYLENOL) 500 MG tablet Take 1,000 mg by mouth 3 (three) times daily. 04/10/22   [provider]  Baclofen 5 MG TABS Take 1 tablet by mouth every 8 (eight) hours as needed for muscle spasms. 04/07/22   Salvadore Oxford, MD  calcitonin, salmon, (MIACALCIN/FORTICAL) 200 UNIT/ACT nasal spray Place 1 spray into alternate nostrils daily. 04/08/22   Salvadore Oxford, MD  calcium carbonate (TUMS - DOSED IN MG ELEMENTAL CALCIUM) 500 MG chewable tablet Chew 1 tablet by mouth 2 (two) times daily.    [provider]  folic acid (FOLVITE) 1 MG tablet Take 1 tablet (1 mg total) by mouth daily. 09/09/19   Elsie Stain, MD  levETIRAcetam (KEPPRA) 500 MG tablet Take 1 tablet (500 mg total) by mouth 2 (two) times daily. 05/18/22   Caren Griffins, MD  magnesium 30 MG tablet Take 1 tablet (30 mg total) by mouth 2 (two) times daily. 05/18/22   Caren Griffins, MD  oxyCODONE (OXY IR/ROXICODONE) 5 MG immediate release tablet Take 1 tablet (5 mg total) by mouth every 8 (eight) hours. 05/18/22   Caren Griffins, MD  thiamine 100 MG tablet Take 1 tablet (100 mg  total) by mouth daily. 09/09/19   Elsie Stain, MD  Vitamin D, Ergocalciferol, (DRISDOL) 1.25 MG (50000 UNIT) CAPS capsule Take 1 capsule (50,000 Units total) by mouth every 7 (seven) days. Patient taking differently: Take 50,000 Units by mouth every 7 (seven) days. Thursdays 04/12/22   Salvadore Oxford, MD      Allergies    Codeine    Review of Systems   Review of Systems  Gastrointestinal:  Positive for abdominal pain.  See HPI  Physical Exam Updated Vital Signs BP 116/67   Pulse 93   Temp 98.7 F (37.1 C) (Oral)   Resp 20   Ht 5\' 4"  (1.626 m)   Wt 59 kg   SpO2 91%   BMI 22.31 kg/m  Physical Exam Vitals and nursing note reviewed.  Constitutional:      Appearance: She is well-developed. She is ill-appearing.  HENT:     Head: Normocephalic and atraumatic.  Eyes:     Conjunctiva/sclera: Conjunctivae normal.  Cardiovascular:     Rate and Rhythm: Normal rate and regular rhythm.     Heart sounds: Normal heart sounds.  Pulmonary:     Effort: Pulmonary effort is normal. No respiratory distress.     Breath sounds: Normal breath sounds.     Comments: Tachypneic Abdominal:     General: Abdomen is flat. Bowel sounds are normal.     Palpations: Abdomen is soft.  Tenderness: There is abdominal tenderness in the epigastric area. There is guarding. There is no rebound.     Hernia: No hernia is present.  Musculoskeletal:        General: No swelling.     Cervical back: Neck supple.  Skin:    General: Skin is warm and dry.     Capillary Refill: Capillary refill takes less than 2 seconds.  Neurological:     General: No focal deficit present.     Mental Status: She is alert and oriented to person, place, and time.  Psychiatric:        Mood and Affect: Mood normal.     ED Results / Procedures / Treatments   Labs (all labs ordered are listed, but only abnormal results are displayed) Labs Reviewed  CBC WITH DIFFERENTIAL/PLATELET - Abnormal; Notable for the following  components:      Result Value   RBC 5.26 (*)    Hemoglobin 16.5 (*)    HCT 50.4 (*)    All other components within normal limits  COMPREHENSIVE METABOLIC PANEL - Abnormal; Notable for the following components:   Chloride 97 (*)    Glucose, Bld 135 (*)    AST 199 (*)    ALT 115 (*)    Alkaline Phosphatase 285 (*)    All other components within normal limits  LIPASE, BLOOD - Abnormal; Notable for the following components:   Lipase 2,099 (*)    All other components within normal limits  URINALYSIS, ROUTINE W REFLEX MICROSCOPIC - Abnormal; Notable for the following components:   APPearance HAZY (*)    Ketones, ur 5 (*)    Protein, ur 30 (*)    Leukocytes,Ua SMALL (*)    Bacteria, UA MANY (*)    All other components within normal limits    EKG EKG Interpretation  Date/Time:  Tuesday May 22 2022 02:50:15 EST Ventricular Rate:  77 PR Interval:  162 QRS Duration: 89 QT Interval:  379 QTC Calculation: 429 R Axis:   77 Text Interpretation: Sinus rhythm Normal ECG When compared with ECG of EARLIER SAME DATE No significant change was found Confirmed by Dione Booze ([id number]) on 05/22/2022 4:25:48 AM  Radiology No results found.  Procedures .Critical Care  Performed by: Netta Corrigan, PA-C Authorized by: Netta Corrigan, PA-C   Critical care provider statement:    Critical care time (minutes):  50   Critical care time was exclusive of:  Separately billable procedures and treating other patients   Critical care was necessary to treat or prevent imminent or life-threatening deterioration of the following conditions: pancreatitis.   Critical care was time spent personally by me on the following activities:  Ordering and performing treatments and interventions, ordering and review of laboratory studies, ordering and review of radiographic studies, pulse oximetry, re-evaluation of patient's condition, review of old charts, obtaining history from patient or surrogate, examination  of patient, evaluation of patient's response to treatment, discussions with primary provider, discussions with consultants and development of treatment plan with patient or surrogate   I assumed direction of critical care for this patient from another provider in my specialty: yes     Care discussed with: admitting provider       Medications Ordered in ED Medications  HYDROmorphone (DILAUDID) injection 1 mg (1 mg Intravenous Given 05/22/22 0249)  sodium chloride 0.9 % bolus 1,000 mL (0 mLs Intravenous Stopped 05/22/22 0432)  HYDROmorphone (DILAUDID) injection 1 mg (1 mg Intravenous Given 05/22/22 0515)  ED Course/ Medical Decision Making/ A&P                             Medical Decision Making Amount and/or Complexity of Data Reviewed Labs: ordered.  Risk Prescription drug management.   Lenell Antu 62 y.o. presented today for abdominal pain. Working DDx that I considered at this time includes, but not limited to, pancreatitis, acid reflux, hepatitis, cholecystitis.  Review of prior external notes: 05/18/22 discharge summary  Unique Tests and My Interpretation:  CBC with differential: Hemoglobin elevated 16.5, hematocrit elevated 50.4 Lipase: 2099 UA: Small amounts of leukocytes CMP: Elevated AST 199, elevated ALT 115, elevated alk phos 285  Discussion with Independent Historian: None  Discussion of Management of Tests: Karmen Bongo Hospitalist  Risk: High:  - hospitalization or escalation of hospital-level care  Risk Stratification Score: None  Staffed with Delora Fuel, MD  Plan: Patient presented with epigastric abdominal pain.  On initial exam patient appeared extremely uncomfortable and was tender to the epigastric region and so Dilaudid was ordered along with labs.  Imaging was not ordered as patient has just been discharged on 05/18/2022 and this appears to be a classic case of pancreatitis.  While awaiting labs patient required another dose of Dilaudid.   Labs indicated pancreatitis with an extremely elevated lipase and transaminitis.  On recheck patient stated that even after the second dose of Dilaudid she was still experiencing pain.  At this point patient has failed pharmacological therapy and with the elevated lipase and now a new elevated transaminases patient would benefit from inpatient care.  Hospitalist will be consulted.  After speaking with hospitalist patient will be excepted for inpatient.  Patient's vitals are stable at this time for inpatient.         Final Clinical Impression(s) / ED Diagnoses Final diagnoses:  Acute pancreatitis, unspecified complication status, unspecified pancreatitis type    Rx / DC Orders ED Discharge Orders     None         [name] 25/[birth year]39 [id number]    Delora Fuel, MD 41/93/79 [phone]

## 2022-05-22 NOTE — ED Triage Notes (Addendum)
With N/V x4 - pt is in Rehab for sacral Fx.  Pt was  seen here for Acute Pancreatitis on Jan 11 -19.  Pt states it feels similar.

## 2022-05-22 NOTE — ED Notes (Signed)
.ED TO INPATIENT HANDOFF REPORT  ED Nurse Name and Phone #: Billie Ruddy [phone]   S Name/Age/Gender Rose Bridges 62 y.o. female Room/Bed: 028C/028C  Code Status   Code Status: Prior  Home/SNF/Other Home Patient oriented to: self, place, time, and situation Is this baseline? Yes   Triage Complete: Triage complete  Chief Complaint Acute pancreatitis [K85.90]  Triage Note With N/V x4 - pt is in Rehab for sacral Fx.  Pt was  seen here for Acute Pancreatitis on Jan 11 -19.  Pt states it feels similar.    Allergies Allergies  Allergen Reactions   Codeine Other (See Comments)    Migraine     Level of Care/Admitting Diagnosis ED Disposition     ED Disposition  Admit   Condition  --   Comment  Hospital Area: [facility] [[id number]]  Level of Care: Med-Surg [16]  May admit patient to [facility] or [facility] if equivalent level of care is available:: Yes  Covid Evaluation: Asymptomatic - no recent exposure (last 10 days) testing not required  Diagnosis: Acute pancreatitis [577.0.ICD-9-CM]  Admitting Physician: Jonah Blue [[id number]]  Attending Physician: Jonah Blue [[id number]]  Certification:: I certify this patient will need inpatient services for at least 2 midnights  Estimated Length of Stay: 3          B Medical/Surgery History Past Medical History:  Diagnosis Date   Hyperlipemia    Hypertension    Osteogenesis imperfecta    Osteoporosis    Past Surgical History:  Procedure Laterality Date   ABDOMINAL HYSTERECTOMY     HIP ARTHROPLASTY Left 09/26/2015   Procedure: ARTHROPLASTY BIPOLAR HIP (HEMIARTHROPLASTY);  Surgeon: Sheral Apley, MD;  Location: [facility] OR;  Service: Orthopedics;  Laterality: Left;   ORIF HUMERUS FRACTURE Right 11/06/2016   Procedure: OPEN REDUCTION INTERNAL FIXATION (ORIF) PROXIMAL HUMERUS FRACTURE;  Surgeon: Sheral Apley, MD;  Location: [facility] OR;  Service: Orthopedics;  Laterality: Right;     A IV  Location/Drains/Wounds Patient Lines/Drains/Airways Status     Active Line/Drains/Airways     Name Placement date Placement time Site Days   Peripheral IV 05/22/22 Anterior;Proximal;Right Forearm 05/22/22  0200  Forearm  less than 1   External Urinary Catheter 05/22/22  0503  --  less than 1            Intake/Output Last 24 hours  Intake/Output Summary (Last 24 hours) at 05/22/2022 0816 Last data filed at 05/22/2022 0432 Gross per 24 hour  Intake 1000 ml  Output --  Net 1000 ml    Labs/Imaging Results for orders placed or performed during the hospital encounter of 05/22/22 (from the past 48 hour(s))  CBC with Differential     Status: Abnormal   Collection Time: 05/22/22  2:00 AM  Result Value Ref Range   WBC 8.7 4.0 - 10.5 K/uL   RBC 5.26 (H) 3.87 - 5.11 MIL/uL   Hemoglobin 16.5 (H) 12.0 - 15.0 g/dL   HCT 96.0 (H) 45.4 - 09.8 %   MCV 95.8 80.0 - 100.0 fL   MCH 31.4 26.0 - 34.0 pg   MCHC 32.7 30.0 - 36.0 g/dL   RDW 11.9 14.7 - 82.9 %   Platelets 275 150 - 400 K/uL   nRBC 0.0 0.0 - 0.2 %   Neutrophils Relative % 77 %   Neutro Abs 6.8 1.7 - 7.7 K/uL   Lymphocytes Relative 15 %   Lymphs Abs 1.3 0.7 - 4.0 K/uL   Monocytes Relative  6 %   Monocytes Absolute 0.5 0.1 - 1.0 K/uL   Eosinophils Relative 1 %   Eosinophils Absolute 0.1 0.0 - 0.5 K/uL   Basophils Relative 1 %   Basophils Absolute 0.0 0.0 - 0.1 K/uL   Immature Granulocytes 0 %   Abs Immature Granulocytes 0.02 0.00 - 0.07 K/uL    Comment: Performed at [facility] [address] [place], [place] [address]  Comprehensive metabolic panel     Status: Abnormal   Collection Time: 05/22/22  2:00 AM  Result Value Ref Range   Sodium 136 135 - 145 mmol/L   Potassium 4.5 3.5 - 5.1 mmol/L   Chloride 97 (L) 98 - 111 mmol/L   CO2 28 22 - 32 mmol/L   Glucose, Bld 135 (H) 70 - 99 mg/dL    Comment: Glucose reference range applies only to samples taken after fasting for at least 8 hours.   BUN 11 8 - 23 mg/dL    Creatinine, Ser 0.89 0.44 - 1.00 mg/dL   Calcium 9.5 8.9 - 10.3 mg/dL   Total Protein 7.6 6.5 - 8.1 g/dL   Albumin 3.6 3.5 - 5.0 g/dL   AST 199 (H) 15 - 41 U/L   ALT 115 (H) 0 - 44 U/L   Alkaline Phosphatase 285 (H) 38 - 126 U/L   Total Bilirubin 0.8 0.3 - 1.2 mg/dL   GFR, Estimated >60 >60 mL/min    Comment: (NOTE) Calculated using the CKD-EPI Creatinine Equation (2021)    Anion gap 11 5 - 15    Comment: Performed at [facility] [address] [place], [place] [address]  Lipase, blood     Status: Abnormal   Collection Time: 05/22/22  2:00 AM  Result Value Ref Range   Lipase 2,099 (H) 11 - 51 U/L    Comment: RESULT CONFIRMED BY MANUAL DILUTION Performed at [facility], [place] [address] [place], [place] [address]   Urinalysis, Routine w reflex microscopic Urine, Clean Catch     Status: Abnormal   Collection Time: 05/22/22  5:00 AM  Result Value Ref Range   Color, Urine YELLOW YELLOW   APPearance HAZY (A) CLEAR   Specific Gravity, Urine 1.016 1.005 - 1.030   pH 8.0 5.0 - 8.0   Glucose, UA NEGATIVE NEGATIVE mg/dL   Hgb urine dipstick NEGATIVE NEGATIVE   Bilirubin Urine NEGATIVE NEGATIVE   Ketones, ur 5 (A) NEGATIVE mg/dL   Protein, ur 30 (A) NEGATIVE mg/dL   Nitrite NEGATIVE NEGATIVE   Leukocytes,Ua SMALL (A) NEGATIVE   RBC / HPF 0-5 0 - 5 RBC/hpf   WBC, UA 0-5 0 - 5 WBC/hpf   Bacteria, UA MANY (A) NONE SEEN   Squamous Epithelial / HPF 0-5 0 - 5 /HPF    Comment: Performed at [facility], [address], [place], [place] [address]   No results found.  Pending Labs Unresulted Labs (From admission, onward)    None       Vitals/Pain Today's Vitals   05/22/22 0715 05/22/22 0730 05/22/22 0745 05/22/22 0800  BP: 116/68 117/66 120/69 127/70  Pulse: 89 80 78 74  Resp: 14 19 (!) 24 14  Temp:      TempSrc:      SpO2: 96% 96% 96% 97%  Weight:      Height:      PainSc:        Isolation Precautions No active isolations  Medications Medications  HYDROmorphone (DILAUDID) injection 1 mg (1 mg Intravenous Given 05/22/22 0249)  sodium chloride 0.9 % bolus 1,000 mL (0 mLs Intravenous Stopped 05/22/22 0432)  HYDROmorphone (DILAUDID) injection 1 mg (1 mg Intravenous Given 05/22/22 0515)    Mobility walks     Focused Assessments Cardiac Assessment Handoff:    No results found for: "CKTOTAL", "CKMB", "CKMBINDEX", "TROPONINI" Lab Results  Component Value Date   DDIMER 2.62 (H) 11/05/2016   Does the Patient currently have chest pain? No   , Neuro Assessment Handoff:  Swallow screen pass? Yes          Neuro Assessment: Within Defined Limits Neuro Checks:      Has TPA been given? No If patient is a Neuro Trauma and patient is going to OR before floor call report to [facility] nurse: [phone] or [phone]  , NA    R Recommendations: See Admitting Provider Note  Report given to:   Additional Notes: none

## 2022-05-23 ENCOUNTER — Ambulatory Visit (HOSPITAL_BASED_OUTPATIENT_CLINIC_OR_DEPARTMENT_OTHER): Payer: Medicare Other | Admitting: Orthopaedic Surgery

## 2022-05-23 DIAGNOSIS — K8500 Idiopathic acute pancreatitis without necrosis or infection: Secondary | ICD-10-CM | POA: Diagnosis not present

## 2022-05-23 MED ORDER — PANTOPRAZOLE SODIUM 40 MG PO TBEC
40.0000 mg | DELAYED_RELEASE_TABLET | Freq: Every day | ORAL | Status: DC
  Administered 2022-05-23 – 2022-05-25 (×3): 40 mg via ORAL
  Filled 2022-05-23 (×3): qty 1

## 2022-05-23 MED ORDER — BISACODYL 10 MG RE SUPP
10.0000 mg | Freq: Once | RECTAL | Status: AC
  Administered 2022-05-23: 10 mg via RECTAL
  Filled 2022-05-23: qty 1

## 2022-05-23 MED ORDER — DOCUSATE SODIUM 100 MG PO CAPS: 100.0000 mg | ORAL_CAPSULE | Freq: Two times a day (BID) | ORAL | Status: DC | PRN

## 2022-05-23 NOTE — Progress Notes (Incomplete)
Initial Nutrition Assessment  DOCUMENTATION CODES:   Not applicable  INTERVENTION:  - Add Ensure Enlive po BID, each supplement provides 350 kcal and 20 grams of protein.  - Add MVI q day.   NUTRITION DIAGNOSIS:   Inadequate oral intake related to poor appetite, nausea as evidenced by per patient/family report.  GOAL:   Patient will meet greater than or equal to 90% of their needs  MONITOR:   PO intake, Supplement acceptance  REASON FOR ASSESSMENT:   Malnutrition Screening Tool    ASSESSMENT:   62 y.o. female admits related to abdominal pain. PMH includes: HLD, HTN, osteogenesis imperfecta, osteoporosis. Pt is currently receiving medical management for acute pancreatitis.  Meds reviewed: folic acid, thiamine. Labs reviewed: Chloride low, AST/ALT elevated.   Pt reports that she has had poor PO intakes since beginning of January. She states that due to the pancreatitis she has been having intense pain after eating. At time of assessment, pt had eaten one pancake and almost a whole banana. No significant wt loss per record. Pt agreed to try Ensure BID for now to help increase intakes. RD will continue to monitor PO intakes.   NUTRITION - FOCUSED PHYSICAL EXAM:  Flowsheet Row Most Recent Value  Orbital Region No depletion  Upper Arm Region Mild depletion  Thoracic and Lumbar Region Unable to assess  Buccal Region No depletion  Temple Region Mild depletion  Clavicle Bone Region No depletion  Clavicle and Acromion Bone Region No depletion  Scapular Bone Region Unable to assess  Dorsal Hand Mild depletion  Patellar Region No depletion  Anterior Thigh Region No depletion  Posterior Calf Region No depletion  Edema (RD Assessment) None  Hair Reviewed  Eyes Reviewed  Mouth Reviewed  Skin Reviewed  Nails Reviewed       Diet Order:   Diet Order             Diet regular Room service appropriate? Yes; Fluid consistency: Thin  Diet effective now                    EDUCATION NEEDS:   Not appropriate for education at this time  Skin:  Skin Assessment: Reviewed RN Assessment  Last BM:  05/23/22  Height:   Ht Readings from Last 1 Encounters:  05/22/22 5\' 4"  (1.626 m)    Weight:   Wt Readings from Last 1 Encounters:  05/22/22 59 kg    Ideal Body Weight:     BMI:  Body mass index is 22.31 kg/m.  Estimated Nutritional Needs:   Kcal:  1475-1770 kcals  Protein:  70-90 gm  Fluid:  >/= 1.4 L  Thalia Bloodgood, RD, LDN, CNSC.

## 2022-05-23 NOTE — Progress Notes (Addendum)
Daily Rounding Note  05/23/2022, 4:28 PM  LOS: 1 day   SUBJECTIVE:   Chief complaint:   Abd pain.  Pancreatitis.  Anxiety.     Pain persists at mid abdomen to RLQ.  Thinks it could be gas.  No BM several days.  No nausea. Tolerating clears.    OBJECTIVE:         Vital signs in last 24 hours:    Temp:  [97.6 F (36.4 C)-98.5 F (36.9 C)] 97.7 F (36.5 C) (01/24 1531) Pulse Rate:  [71-80] 78 (01/24 1531) Resp:  [16-18] 17 (01/24 1531) BP: (112-145)/(64-77) 145/77 (01/24 1531) SpO2:  [93 %-98 %] 98 % (01/24 1531) Last BM Date : 05/17/22 Filed Weights   05/22/22 0145  Weight: 59 kg   General: Frail, sickly appearing.  But alert.  Does not appear uncomfortable and is not toxic. Heart: RRR. Chest: Clear bilaterally without labored breathing. Abdomen: Soft, nondistended.  Active bowel sounds.  Tenderness is diffuse to even light touch.  No guarding or rebound. Extremities: No CCE Neuro/Psych: Calmer today but still anxious.  Cooperative.  No confusion.  Intake/Output from previous day: 01/23 0701 - 01/24 0700 In: -  Out: 1700 [Urine:1700]  Intake/Output this shift: Total I/O In: 3184.1 [P.O.:120; I.V.:3064.1] Out: 1000 [Urine:1000]  Lab Results: Recent Labs    05/22/22 0200  WBC 8.7  HGB 16.5*  HCT 50.4*  PLT 275   BMET Recent Labs    05/22/22 0200  NA 136  K 4.5  CL 97*  CO2 28  GLUCOSE 135*  BUN 11  CREATININE 0.89  CALCIUM 9.5   LFT Recent Labs    05/22/22 0200  PROT 7.6  ALBUMIN 3.6  AST 199*  ALT 115*  ALKPHOS 285*  BILITOT 0.8   PT/INR No results for input(s): "LABPROT", "INR" in the last 72 hours. Hepatitis Panel No results for input(s): "HEPBSAG", "HCVAB", "HEPAIGM", "HEPBIGM" in the last 72 hours.  Studies/Results: CT ABDOMEN PELVIS W CONTRAST  Result Date: 05/23/2022 CLINICAL DATA:  62 year old female with mild pancreatic inflammation suspected on MRI this month. Dilated  biliary system with no discrete obstructing etiology on MRI/MRCP. EXAM: CT ABDOMEN AND PELVIS WITH CONTRAST TECHNIQUE: Multidetector CT imaging of the abdomen and pelvis was performed using the standard protocol following bolus administration of intravenous contrast. RADIATION DOSE REDUCTION: This exam was performed according to the departmental dose-optimization program which includes automated exposure control, adjustment of the mA and/or kV according to patient size and/or use of iterative reconstruction technique. CONTRAST:  32mL OMNIPAQUE IOHEXOL 350 MG/ML SOLN COMPARISON:  Abdomen MRI 05/14/2022. CT Abdomen and Pelvis 05/13/2022. FINDINGS: Lower chest: Enhancing dependent atelectasis with trace pleural fluid, mild. No pericardial effusion. Lung base ventilation stable to mildly improved since 05/13/2022. Hepatobiliary: Intra- and extrahepatic biliary ductal dilatation has not significantly changed since 05/13/2022. This extends to the ampulla as before (coronal image 42). Evidence of adenomyomatosis at the gallbladder fundus. No calculus identified. Liver enhancement remains within normal limits. Pancreas: Stable main pancreatic ductal dilatation most pronounced in the pancreatic head. No definite mass or active inflammation. Spleen: Negative. Adrenals/Urinary Tract: Normal adrenal glands. Kidneys appears stable and nonobstructed with symmetric enhancement and contrast excretion. Unremarkable bladder. Incidental pelvic phleboliths. Stomach/Bowel: Gas distended distal sigmoid colon and proximal rectum. Redundant sigmoid with other retained stool. Some oral contrast mixed with stool in the sigmoid colon. Concentrated oral contrast has reached the mid transverse colon. Redundant splenic flexure with retained stool.  Negative right colon and elongated appendix which tracks to the midline on series 3, image 62. Decompressed terminal ileum. Contrast containing small bowel loops are nondilated. Dilute contrast in the  stomach. Large and somewhat fusiform diverticula of the duodenum in the right retroperitoneum redemonstrated containing both contrast and flocculated material. This is about 8 cm diameter. No active inflammation identified. No free air or free fluid. Vascular/Lymphatic: Aortoiliac calcified atherosclerosis. Normal caliber abdominal aorta. No lymphadenopathy identified. Portal venous system is patent. Reproductive: Surgically absent uterus. Diminutive or absent ovaries. Other: Presacral stranding has regressed.  No pelvic free fluid. Musculoskeletal: Probable collapsed and partially calcified breast implants. Osteopenia. Compression fractures of L1 and L3 appears stable. Chronic S4 sacral fracture appears stable. Chronic bilateral sacral ala fractures appear stable, likely insufficiency related. Left hip arthroplasty. No new osseous abnormality. IMPRESSION: 1. Unchanged intra- and extrahepatic biliary and main pancreatic ductal dilatation. The CBD is dilated to the ampulla as on recent MRI. Superimposed large (8 cm) duodenal diverticulum there, but no active inflammation or other complicating features identified. 2. No pancreatic inflammation by CT. No new abnormality in the abdomen or pelvis. 3. Diffuse osteopenia with stable lumbar compression fractures, sacral fractures. 4.  Aortic Atherosclerosis (ICD10-I70.0). Electronically Signed   By: Genevie Ann M.D.   On: 05/23/2022 07:28    ASSESMENT:     Recurrent abd pain in pt w idiopathic acute pancreatitis, discharged last week.   CT repeat yesterday: stable ductal dilatation.  Stable large duodenal tic.  Pancreatic inflammation resolved.     PLAN     GI signing off.  Has EUS set for 07/05/22.  Dr Rush Landmark is not able to perform this any sooner.   Advancing diet to regular, discussed with patient and she prefers to choose her meals.  Dulcolax suppository now, also discussed with patient.  Start Protonix 40 mg po daily.      Azucena Freed  05/23/2022, 4:28  PM Phone [phone]   Attending physician's note   I have taken a history, reviewed the chart and examined the patient. I performed a substantive portion of this encounter, including complete performance of at least one of the key components, in conjunction with the APP. I agree with the APP's note, impression and recommendations.   CT PE abdomen pelvis showed improvement of pancreatic inflammation CBD and intrahepatic duct dilation, patient is scheduled for EUS with Dr. Rush Landmark in March.  Unfortunately cannot be done anytime sooner  Continue supportive care  GI signing off, available if have any questions   The patient was provided an opportunity to ask questions and all were answered. The patient agreed with the plan and demonstrated an understanding of the instructions.   Damaris Hippo , MD [phone]

## 2022-05-23 NOTE — Progress Notes (Signed)
PROGRESS NOTE    Rose Bridges  KGU:[id number] DOB: [birth year] DOA: 05/22/2022 PCP: Cain Saupe, MD     Brief Narrative:  Rose Bridges is a 62 y.o. female with medical history significant of HTN, HLD, and osteogenesis imperfecta presenting with abdominal pain.  She was last admitted from 1/11-19 with E. Coli/Proteus UTI, treated with Rocephin.  While hospitalized she developed AMS that was thought to be hyperactive delirium.  She also developed acute pancreatitis; she was planned for EUS as an outpatient (3/7).  She went to SNF on discharge and she was placed on mechanical diet, very salty.  When she was there the first time, she "went on strike" and refused food, meds.  She didn't have pain pills until Sunday and she was in agony.    New events last 24 hours / Subjective: Continues to have severe epigastric and right upper quadrant abdominal pain this morning associated with some nausea.  Assessment & Plan:   Principal Problem:   Acute pancreatitis without infection or necrosis Active Problems:   Seizure (HCC)   Osteogenesis imperfecta   Alcohol dependence (HCC)   Recurrent acute pancreatitis -Followed by GI, planning for outpatient EUS in March -Continue IV fluid, clear liquid diet, pain control, antiemetic -Repeat CT abdomen pelvis:  Unchanged intra- and extrahepatic biliary and main pancreatic ductal dilatation. The CBD is dilated to the ampulla as on recent MRI. Superimposed large (8 cm) duodenal diverticulum there, but no active inflammation or other complicating features identified.  No pancreatic inflammation by CT. No new abnormality in the abdomen or pelvis.  Seizure disorder -Keppra  Osteogenesis imperfecta -Has alar fracture on 04/03/2022. Nonoperative. Was discharged to SNF on intranasal calcitonin   ?Alcohol use -Has history of heavy alcohol use, unsure if someone has been bringing her alcohol into SNF where she was discharged to    DVT prophylaxis:  SCDs  Start: 05/22/22 1057  Code Status: Full Family Communication: None at bedside Disposition Plan:  Status is: Inpatient Remains inpatient appropriate because: IVF  Consultants:  GI   Procedures:  None   Antimicrobials:  Anti-infectives (From admission, onward)    None        Objective: Vitals:   05/22/22 1459 05/22/22 2157 05/23/22 0410 05/23/22 0752  BP: 110/68 119/64 112/65 125/75  Pulse: 79 77 71 80  Resp: 18 16 17 18   Temp: 98.7 F (37.1 C) 98.5 F (36.9 C) 98 F (36.7 C) 97.6 F (36.4 C)  TempSrc: Oral Oral Oral Oral  SpO2: 97% 93% 96% 95%  Weight:      Height:        Intake/Output Summary (Last 24 hours) at 05/23/2022 1251 Last data filed at 05/22/2022 2100 Gross per 24 hour  Intake --  Output 1700 ml  Net -1700 ml   Filed Weights   05/22/22 0145  Weight: 59 kg    Examination:  General exam: Appears in pain, tearful  Respiratory system: Clear to auscultation. Respiratory effort normal. No respiratory distress. No conversational dyspnea.  Cardiovascular system: S1 & S2 heard, RRR. No murmurs. No pedal edema. Gastrointestinal system: Abdomen is nondistended, soft and TTP epigastrium  Central nervous system: Alert and oriented.  Extremities: Symmetric in appearance  Skin: No rashes, lesions or ulcers on exposed skin   Data Reviewed: I have personally reviewed following labs and imaging studies  CBC: Recent Labs  Lab 05/17/22 0447 05/18/22 0341 05/22/22 0200  WBC 6.6 6.9 8.7  NEUTROABS  --   --  6.8  HGB  12.0 12.4 16.5*  HCT 35.8* 36.3 50.4*  MCV 95.2 95.0 95.8  PLT 241 266 086   Basic Metabolic Panel: Recent Labs  Lab 05/17/22 0447 05/18/22 0341 05/22/22 0200  NA 139 140 136  K 4.0 4.1 4.5  CL 107 104 97*  CO2 28 29 28   GLUCOSE 105* 98 135*  BUN <5* 7* 11  CREATININE 0.69 0.72 0.89  CALCIUM 8.4* 8.8* 9.5  MG 1.5* 1.4*  --    GFR: Estimated Creatinine Clearance: 56.6 mL/min (by C-G formula based on SCr of 0.89 mg/dL). Liver  Function Tests: Recent Labs  Lab 05/17/22 0447 05/22/22 0200  AST 29 199*  ALT 26 115*  ALKPHOS 121 285*  BILITOT 0.4 0.8  PROT 5.2* 7.6  ALBUMIN 2.4* 3.6   Recent Labs  Lab 05/18/22 0341 05/22/22 0200  LIPASE 96* 2,099*   Recent Labs  Lab 05/18/22 0341  AMMONIA 36*   Coagulation Profile: No results for input(s): "INR", "PROTIME" in the last 168 hours. Cardiac Enzymes: No results for input(s): "CKTOTAL", "CKMB", "CKMBINDEX", "TROPONINI" in the last 168 hours. BNP (last 3 results) No results for input(s): "PROBNP" in the last 8760 hours. HbA1C: No results for input(s): "HGBA1C" in the last 72 hours. CBG: Recent Labs  Lab 05/18/22 0353 05/18/22 0857 05/18/22 1148 05/18/22 1612 05/18/22 1928  GLUCAP 95 91 115* 145* 116*   Lipid Profile: No results for input(s): "CHOL", "HDL", "LDLCALC", "TRIG", "CHOLHDL", "LDLDIRECT" in the last 72 hours. Thyroid Function Tests: No results for input(s): "TSH", "T4TOTAL", "FREET4", "T3FREE", "THYROIDAB" in the last 72 hours. Anemia Panel: No results for input(s): "VITAMINB12", "FOLATE", "FERRITIN", "TIBC", "IRON", "RETICCTPCT" in the last 72 hours. Sepsis Labs: No results for input(s): "PROCALCITON", "LATICACIDVEN" in the last 168 hours.  No results found for this or any previous visit (from the past 240 hour(s)).    Radiology Studies: CT ABDOMEN PELVIS W CONTRAST  Result Date: 05/23/2022 CLINICAL DATA:  62 year old female with mild pancreatic inflammation suspected on MRI this month. Dilated biliary system with no discrete obstructing etiology on MRI/MRCP. EXAM: CT ABDOMEN AND PELVIS WITH CONTRAST TECHNIQUE: Multidetector CT imaging of the abdomen and pelvis was performed using the standard protocol following bolus administration of intravenous contrast. RADIATION DOSE REDUCTION: This exam was performed according to the departmental dose-optimization program which includes automated exposure control, adjustment of the mA and/or kV  according to patient size and/or use of iterative reconstruction technique. CONTRAST:  64mL OMNIPAQUE IOHEXOL 350 MG/ML SOLN COMPARISON:  Abdomen MRI 05/14/2022. CT Abdomen and Pelvis 05/13/2022. FINDINGS: Lower chest: Enhancing dependent atelectasis with trace pleural fluid, mild. No pericardial effusion. Lung base ventilation stable to mildly improved since 05/13/2022. Hepatobiliary: Intra- and extrahepatic biliary ductal dilatation has not significantly changed since 05/13/2022. This extends to the ampulla as before (coronal image 42). Evidence of adenomyomatosis at the gallbladder fundus. No calculus identified. Liver enhancement remains within normal limits. Pancreas: Stable main pancreatic ductal dilatation most pronounced in the pancreatic head. No definite mass or active inflammation. Spleen: Negative. Adrenals/Urinary Tract: Normal adrenal glands. Kidneys appears stable and nonobstructed with symmetric enhancement and contrast excretion. Unremarkable bladder. Incidental pelvic phleboliths. Stomach/Bowel: Gas distended distal sigmoid colon and proximal rectum. Redundant sigmoid with other retained stool. Some oral contrast mixed with stool in the sigmoid colon. Concentrated oral contrast has reached the mid transverse colon. Redundant splenic flexure with retained stool. Negative right colon and elongated appendix which tracks to the midline on series 3, image 62. Decompressed terminal ileum. Contrast containing small  bowel loops are nondilated. Dilute contrast in the stomach. Large and somewhat fusiform diverticula of the duodenum in the right retroperitoneum redemonstrated containing both contrast and flocculated material. This is about 8 cm diameter. No active inflammation identified. No free air or free fluid. Vascular/Lymphatic: Aortoiliac calcified atherosclerosis. Normal caliber abdominal aorta. No lymphadenopathy identified. Portal venous system is patent. Reproductive: Surgically absent uterus.  Diminutive or absent ovaries. Other: Presacral stranding has regressed.  No pelvic free fluid. Musculoskeletal: Probable collapsed and partially calcified breast implants. Osteopenia. Compression fractures of L1 and L3 appears stable. Chronic S4 sacral fracture appears stable. Chronic bilateral sacral ala fractures appear stable, likely insufficiency related. Left hip arthroplasty. No new osseous abnormality. IMPRESSION: 1. Unchanged intra- and extrahepatic biliary and main pancreatic ductal dilatation. The CBD is dilated to the ampulla as on recent MRI. Superimposed large (8 cm) duodenal diverticulum there, but no active inflammation or other complicating features identified. 2. No pancreatic inflammation by CT. No new abnormality in the abdomen or pelvis. 3. Diffuse osteopenia with stable lumbar compression fractures, sacral fractures. 4.  Aortic Atherosclerosis (ICD10-I70.0). Electronically Signed   By: Genevie Ann M.D.   On: 05/23/2022 07:28      Scheduled Meds:  acetaminophen  1,000 mg Oral TID   calcitonin (salmon)  1 spray Alternating Nares Daily   folic acid  1 mg Oral Daily   levETIRAcetam  500 mg Oral BID   oxyCODONE  5 mg Oral Q8H   thiamine  100 mg Oral Daily   Continuous Infusions:  lactated ringers 200 mL/hr at 05/23/22 0830     LOS: 1 day   Time spent: 25 minutes   Dessa Phi, DO [facility] 05/23/2022, 12:51 PM   Available via Epic secure chat 7am-7pm After these hours, please refer to coverage provider listed on [email or web address]

## 2022-05-23 NOTE — Progress Notes (Signed)
CSW spoke with Lorenza Chick at [facility] who states the facility can accept the patient whenever she is medically cleared for discharge and a bed is available.  Madilyn Fireman, MSW, LCSW Transitions of Care  Clinical Social Worker II [phone]

## 2022-05-24 DIAGNOSIS — K8500 Idiopathic acute pancreatitis without necrosis or infection: Secondary | ICD-10-CM | POA: Diagnosis not present

## 2022-05-24 LAB — COMPREHENSIVE METABOLIC PANEL
ALT: 91 U/L — ABNORMAL HIGH (ref 0–44)
AST: 188 U/L — ABNORMAL HIGH (ref 15–41)
Albumin: 2.5 g/dL — ABNORMAL LOW (ref 3.5–5.0)
Alkaline Phosphatase: 172 U/L — ABNORMAL HIGH (ref 38–126)
Anion gap: 8 (ref 5–15)
BUN: 5 mg/dL — ABNORMAL LOW (ref 8–23)
CO2: 28 mmol/L (ref 22–32)
Calcium: 8.4 mg/dL — ABNORMAL LOW (ref 8.9–10.3)
Chloride: 103 mmol/L (ref 98–111)
Creatinine, Ser: 0.64 mg/dL (ref 0.44–1.00)
GFR, Estimated: >60 mL/min (ref >60)
Glucose, Bld: 100 mg/dL — ABNORMAL HIGH (ref 70–99)
Potassium: 3.9 mmol/L (ref 3.5–5.1)
Sodium: 139 mmol/L (ref 135–145)
Total Bilirubin: 0.7 mg/dL (ref 0.3–1.2)
Total Protein: 5.2 g/dL — ABNORMAL LOW (ref 6.5–8.1)

## 2022-05-24 MED ORDER — OXYCODONE HCL 5 MG PO TABS: 5.0000 mg | ORAL_TABLET | Freq: Four times a day (QID) | ORAL | Status: DC | PRN

## 2022-05-24 MED ORDER — ADULT MULTIVITAMIN W/MINERALS CH
1.0000 | ORAL_TABLET | Freq: Every day | ORAL | Status: DC
  Administered 2022-05-24 – 2022-05-25 (×2): 1 via ORAL
  Filled 2022-05-24 (×2): qty 1

## 2022-05-24 MED ORDER — OXYCODONE HCL 5 MG PO TABS
5.0000 mg | ORAL_TABLET | Freq: Four times a day (QID) | ORAL | Status: DC | PRN
  Administered 2022-05-24 – 2022-05-25 (×2): 5 mg via ORAL
  Filled 2022-05-24 (×2): qty 1

## 2022-05-24 MED ORDER — ENSURE ENLIVE PO LIQD
237.0000 mL | Freq: Two times a day (BID) | ORAL | Status: DC
  Administered 2022-05-24 – 2022-05-25 (×2): 237 mL via ORAL

## 2022-05-24 MED ORDER — HYDROXYZINE HCL 10 MG PO TABS
10.0000 mg | ORAL_TABLET | Freq: Three times a day (TID) | ORAL | Status: DC | PRN
  Filled 2022-05-24: qty 1

## 2022-05-24 MED ORDER — MORPHINE SULFATE (PF) 2 MG/ML IV SOLN
1.0000 mg | INTRAVENOUS | Status: DC | PRN
  Administered 2022-05-24 – 2022-05-25 (×4): 1 mg via INTRAVENOUS
  Filled 2022-05-24 (×4): qty 1

## 2022-05-24 NOTE — Progress Notes (Signed)
PROGRESS NOTE    Rose Bridges  XBM:[id number] DOB: [birth year] DOA: 05/22/2022 PCP: Antony Blackbird, MD     Brief Narrative:  Rose Bridges is a 62 y.o. female with medical history significant of HTN, HLD, and osteogenesis imperfecta presenting with abdominal pain.  She was last admitted from 1/11-19 with E. Coli/Proteus UTI, treated with Rocephin.  While hospitalized she developed AMS that was thought to be hyperactive delirium.  She also developed acute pancreatitis; she was planned for EUS as an outpatient (3/7).  She went to SNF on discharge and she was placed on mechanical diet, very salty.  When she was there the first time, she "went on strike" and refused food, meds.  She didn't have pain pills until Sunday and she was in agony.    New events last 24 hours / Subjective: Continues to have a lot of pain.  Is very tearful, states that her mom is currently under hospice care.  Assessment & Plan:   Principal Problem:   Acute pancreatitis without infection or necrosis Active Problems:   Seizure ([facility])   Osteogenesis imperfecta   Alcohol dependence ([place])   Recurrent acute pancreatitis -Followed by GI, planning for outpatient EUS in March -Repeat CT abdomen pelvis:  Unchanged intra- and extrahepatic biliary and main pancreatic ductal dilatation. The CBD is dilated to the ampulla as on recent MRI. Superimposed large (8 cm) duodenal diverticulum there, but no active inflammation or other complicating features identified.  No pancreatic inflammation by CT. No new abnormality in the abdomen or pelvis. -Continue supportive care, wean down IV morphine and treat pain with p.o. oxycodone  Seizure disorder -Keppra  Osteogenesis imperfecta -Has alar fracture on 04/03/2022. Nonoperative. Was discharged to SNF on intranasal calcitonin   ?Alcohol use -Has history of heavy alcohol use, unsure if someone has been bringing her alcohol into SNF where she was discharged to    DVT prophylaxis:   SCDs Start: 05/22/22 1057  Code Status: Full Family Communication: None at bedside Disposition Plan:  Status is: Inpatient Remains inpatient appropriate because: IV pain medication  Consultants:  GI   Procedures:  None   Antimicrobials:  Anti-infectives (From admission, onward)    None        Objective: Vitals:   05/24/22 0129 05/24/22 0405 05/24/22 0816 05/24/22 0830  BP: 121/79 (!) 145/70  (!) 142/81  Pulse: 81 80  78  Resp: 20     Temp:  (!) 97.5 F (36.4 C) 98 F (36.7 C)   TempSrc:  Oral Oral   SpO2: 94% 90%  97%  Weight:      Height:        Intake/Output Summary (Last 24 hours) at 05/24/2022 1121 Last data filed at 05/23/2022 1720 Gross per 24 hour  Intake 3184.14 ml  Output 1700 ml  Net 1484.14 ml    Filed Weights   05/22/22 0145  Weight: 59 kg    Examination:  General exam: Appears in pain, tearful  Respiratory system: Clear to auscultation. Respiratory effort normal. No respiratory distress. No conversational dyspnea.  Cardiovascular system: S1 & S2 heard, RRR. No murmurs. No pedal edema. Gastrointestinal system: Abdomen is nondistended, soft and TTP epigastrium  Central nervous system: Alert and oriented.  Extremities: Symmetric in appearance  Skin: No rashes, lesions or ulcers on exposed skin   Data Reviewed: I have personally reviewed following labs and imaging studies  CBC: Recent Labs  Lab 05/18/22 0341 05/22/22 0200  WBC 6.9 8.7  NEUTROABS  --  6.8  HGB 12.4 16.5*  HCT 36.3 50.4*  MCV 95.0 95.8  PLT 266 474    Basic Metabolic Panel: Recent Labs  Lab 05/18/22 0341 05/22/22 0200 05/24/22 0653  NA 140 136 139  K 4.1 4.5 3.9  CL 104 97* 103  CO2 29 28 28   GLUCOSE 98 135* 100*  BUN 7* 11 5*  CREATININE 0.72 0.89 0.64  CALCIUM 8.8* 9.5 8.4*  MG 1.4*  --   --     GFR: Estimated Creatinine Clearance: 62.2 mL/min (by C-G formula based on SCr of 0.64 mg/dL). Liver Function Tests: Recent Labs  Lab 05/22/22 0200  05/24/22 0653  AST 199* 188*  ALT 115* 91*  ALKPHOS 285* 172*  BILITOT 0.8 0.7  PROT 7.6 5.2*  ALBUMIN 3.6 2.5*    Recent Labs  Lab 05/18/22 0341 05/22/22 0200  LIPASE 96* 2,099*    Recent Labs  Lab 05/18/22 0341  AMMONIA 36*    Coagulation Profile: No results for input(s): "INR", "PROTIME" in the last 168 hours. Cardiac Enzymes: No results for input(s): "CKTOTAL", "CKMB", "CKMBINDEX", "TROPONINI" in the last 168 hours. BNP (last 3 results) No results for input(s): "PROBNP" in the last 8760 hours. HbA1C: No results for input(s): "HGBA1C" in the last 72 hours. CBG: Recent Labs  Lab 05/18/22 0353 05/18/22 0857 05/18/22 1148 05/18/22 1612 05/18/22 1928  GLUCAP 95 91 115* 145* 116*    Lipid Profile: No results for input(s): "CHOL", "HDL", "LDLCALC", "TRIG", "CHOLHDL", "LDLDIRECT" in the last 72 hours. Thyroid Function Tests: No results for input(s): "TSH", "T4TOTAL", "FREET4", "T3FREE", "THYROIDAB" in the last 72 hours. Anemia Panel: No results for input(s): "VITAMINB12", "FOLATE", "FERRITIN", "TIBC", "IRON", "RETICCTPCT" in the last 72 hours. Sepsis Labs: No results for input(s): "PROCALCITON", "LATICACIDVEN" in the last 168 hours.  No results found for this or any previous visit (from the past 240 hour(s)).    Radiology Studies: CT ABDOMEN PELVIS W CONTRAST  Result Date: 05/23/2022 CLINICAL DATA:  62 year old female with mild pancreatic inflammation suspected on MRI this month. Dilated biliary system with no discrete obstructing etiology on MRI/MRCP. EXAM: CT ABDOMEN AND PELVIS WITH CONTRAST TECHNIQUE: Multidetector CT imaging of the abdomen and pelvis was performed using the standard protocol following bolus administration of intravenous contrast. RADIATION DOSE REDUCTION: This exam was performed according to the departmental dose-optimization program which includes automated exposure control, adjustment of the mA and/or kV according to patient size and/or use  of iterative reconstruction technique. CONTRAST:  41mL OMNIPAQUE IOHEXOL 350 MG/ML SOLN COMPARISON:  Abdomen MRI 05/14/2022. CT Abdomen and Pelvis 05/13/2022. FINDINGS: Lower chest: Enhancing dependent atelectasis with trace pleural fluid, mild. No pericardial effusion. Lung base ventilation stable to mildly improved since 05/13/2022. Hepatobiliary: Intra- and extrahepatic biliary ductal dilatation has not significantly changed since 05/13/2022. This extends to the ampulla as before (coronal image 42). Evidence of adenomyomatosis at the gallbladder fundus. No calculus identified. Liver enhancement remains within normal limits. Pancreas: Stable main pancreatic ductal dilatation most pronounced in the pancreatic head. No definite mass or active inflammation. Spleen: Negative. Adrenals/Urinary Tract: Normal adrenal glands. Kidneys appears stable and nonobstructed with symmetric enhancement and contrast excretion. Unremarkable bladder. Incidental pelvic phleboliths. Stomach/Bowel: Gas distended distal sigmoid colon and proximal rectum. Redundant sigmoid with other retained stool. Some oral contrast mixed with stool in the sigmoid colon. Concentrated oral contrast has reached the mid transverse colon. Redundant splenic flexure with retained stool. Negative right colon and elongated appendix which tracks to the midline on series 3, image 62. Decompressed  terminal ileum. Contrast containing small bowel loops are nondilated. Dilute contrast in the stomach. Large and somewhat fusiform diverticula of the duodenum in the right retroperitoneum redemonstrated containing both contrast and flocculated material. This is about 8 cm diameter. No active inflammation identified. No free air or free fluid. Vascular/Lymphatic: Aortoiliac calcified atherosclerosis. Normal caliber abdominal aorta. No lymphadenopathy identified. Portal venous system is patent. Reproductive: Surgically absent uterus. Diminutive or absent ovaries. Other:  Presacral stranding has regressed.  No pelvic free fluid. Musculoskeletal: Probable collapsed and partially calcified breast implants. Osteopenia. Compression fractures of L1 and L3 appears stable. Chronic S4 sacral fracture appears stable. Chronic bilateral sacral ala fractures appear stable, likely insufficiency related. Left hip arthroplasty. No new osseous abnormality. IMPRESSION: 1. Unchanged intra- and extrahepatic biliary and main pancreatic ductal dilatation. The CBD is dilated to the ampulla as on recent MRI. Superimposed large (8 cm) duodenal diverticulum there, but no active inflammation or other complicating features identified. 2. No pancreatic inflammation by CT. No new abnormality in the abdomen or pelvis. 3. Diffuse osteopenia with stable lumbar compression fractures, sacral fractures. 4.  Aortic Atherosclerosis (ICD10-I70.0). Electronically Signed   By: Odessa Fleming M.D.   On: 05/23/2022 07:28      Scheduled Meds:  acetaminophen  1,000 mg Oral TID   calcitonin (salmon)  1 spray Alternating Nares Daily   folic acid  1 mg Oral Daily   levETIRAcetam  500 mg Oral BID   pantoprazole  40 mg Oral Q0600   thiamine  100 mg Oral Daily   Continuous Infusions:     LOS: 2 days   Time spent: 25 minutes   Noralee Stain, DO [facility] 05/24/2022, 11:21 AM   Available via Epic secure chat 7am-7pm After these hours, please refer to coverage provider listed on [email or web address]

## 2022-05-24 NOTE — Progress Notes (Addendum)
CSW spoke with MD who states patient is not medically stable for discharge yet - anticipated discharge date of tomorrow 05/25/22.  Madilyn Fireman, MSW, LCSW Transitions of Care  Clinical Social Worker II [phone]

## 2022-05-25 ENCOUNTER — Other Ambulatory Visit: Payer: Self-pay

## 2022-05-25 DIAGNOSIS — K8500 Idiopathic acute pancreatitis without necrosis or infection: Secondary | ICD-10-CM | POA: Diagnosis not present

## 2022-05-25 LAB — COMPREHENSIVE METABOLIC PANEL
ALT: 64 U/L — ABNORMAL HIGH (ref 0–44)
AST: 76 U/L — ABNORMAL HIGH (ref 15–41)
Albumin: 2.5 g/dL — ABNORMAL LOW (ref 3.5–5.0)
Alkaline Phosphatase: 168 U/L — ABNORMAL HIGH (ref 38–126)
Anion gap: 8 (ref 5–15)
BUN: 9 mg/dL (ref 8–23)
CO2: 28 mmol/L (ref 22–32)
Calcium: 8.2 mg/dL — ABNORMAL LOW (ref 8.9–10.3)
Chloride: 103 mmol/L (ref 98–111)
Creatinine, Ser: 0.67 mg/dL (ref 0.44–1.00)
GFR, Estimated: >60 mL/min (ref >60)
Glucose, Bld: 99 mg/dL (ref 70–99)
Potassium: 3.9 mmol/L (ref 3.5–5.1)
Sodium: 139 mmol/L (ref 135–145)
Total Bilirubin: 0.3 mg/dL (ref 0.3–1.2)
Total Protein: 5.5 g/dL — ABNORMAL LOW (ref 6.5–8.1)

## 2022-05-25 MED ORDER — OXYCODONE HCL 5 MG PO TABS: 5.0000 mg | ORAL_TABLET | Freq: Four times a day (QID) | ORAL | 0 refills | Status: DC

## 2022-05-25 MED ORDER — HYDROXYZINE HCL 10 MG PO TABS
10.0000 mg | ORAL_TABLET | Freq: Three times a day (TID) | ORAL | 0 refills | Status: DC | PRN
  Filled 2022-05-25: qty 30, 10d supply, fill #0

## 2022-05-25 MED ORDER — OXYCODONE HCL 5 MG PO TABS: 5.0000 mg | ORAL_TABLET | ORAL | 0 refills | Status: DC | PRN

## 2022-05-25 MED ORDER — OXYCODONE HCL 5 MG PO TABS
5.0000 mg | ORAL_TABLET | Freq: Four times a day (QID) | ORAL | Status: DC | PRN
  Administered 2022-05-25: 10 mg via ORAL
  Filled 2022-05-25: qty 2

## 2022-05-25 MED ORDER — OXYCODONE HCL 5 MG PO TABS: 5.0000 mg | ORAL_TABLET | Freq: Four times a day (QID) | ORAL | Status: DC | PRN

## 2022-05-25 NOTE — Discharge Summary (Signed)
Physician Discharge Summary  Rose Bridges RJJ:[id number] DOB: [birth year] DOA: 05/22/2022  PCP: Antony Blackbird, MD  Admit date: 05/22/2022 Discharge date: 05/25/2022  Admitted From: SNF Disposition:  SNF  Recommendations for Outpatient Follow-up:  Follow up with PCP in 1 week Follow up with GI for scheduled EUS   Discharge Condition: Stable CODE STATUS: Full  Diet recommendation:  Diet Orders (From admission, onward)     Start     Ordered   05/23/22 1645  Diet regular Room service appropriate? Yes; Fluid consistency: Thin  Diet effective now       Question Answer Comment  Room service appropriate? Yes   Fluid consistency: Thin      05/23/22 1644           Brief/Interim Summary: Rose Bridges is a 62 y.o. female with medical history significant of HTN, HLD, and osteogenesis imperfecta presenting with abdominal pain.  She was last admitted from 1/11-19 with E. Coli/Proteus UTI, treated with Rocephin.  While hospitalized she developed AMS that was thought to be hyperactive delirium.  She also developed acute pancreatitis; she was planned for EUS as an outpatient (3/7).  She went to SNF on discharge and she was placed on mechanical diet, very salty.  When she was there the first time, she "went on strike" and refused food, meds.  She didn't have pain pills until Sunday and she was in agony.      Discharge Diagnoses:   Principal Problem:   Acute pancreatitis without infection or necrosis Active Problems:   Seizure ([place])   Osteogenesis imperfecta   Alcohol dependence ([place])  Recurrent acute pancreatitis -Followed by GI, planning for outpatient EUS in March -Repeat CT abdomen pelvis:  Unchanged intra- and extrahepatic biliary and main pancreatic ductal dilatation. The CBD is dilated to the ampulla as on recent MRI. Superimposed large (8 cm) duodenal diverticulum there, but no active inflammation or other complicating features identified.  No pancreatic inflammation by CT. No  new abnormality in the abdomen or pelvis. -Continue supportive care, pain control    Seizure disorder -Keppra   Osteogenesis imperfecta -Has alar fracture on 04/03/2022. Nonoperative. Was discharged to SNF on intranasal calcitonin    Hx of alcohol use -Has history of heavy alcohol use  Discharge Instructions  Discharge Instructions     Increase activity slowly   Complete by: As directed       Allergies as of 05/25/2022       Reactions   Codeine Other (See Comments)   Migraine        Medication List     TAKE these medications    acetaminophen 500 MG tablet Commonly known as: TYLENOL Take 1,000 mg by mouth 3 (three) times daily.   Baclofen 5 MG Tabs Take 1 tablet by mouth every 8 (eight) hours as needed for muscle spasms.   calcitonin (salmon) 200 UNIT/ACT nasal spray Commonly known as: MIACALCIN/FORTICAL Place 1 spray into alternate nostrils daily.   calcium carbonate 500 MG chewable tablet Commonly known as: TUMS - dosed in mg elemental calcium Chew 1 tablet by mouth 2 (two) times daily.   folic acid 1 MG tablet Commonly known as: FOLVITE Take 1 tablet (1 mg total) by mouth daily.   hydrOXYzine 10 MG tablet Commonly known as: ATARAX Take 1 tablet (10 mg total) by mouth 3 (three) times daily as needed for anxiety.   levETIRAcetam 500 MG tablet Commonly known as: KEPPRA Take 1 tablet (500 mg total) by mouth 2 (two) times  daily.   magnesium 30 MG tablet Take 1 tablet (30 mg total) by mouth 2 (two) times daily.   oxyCODONE 5 MG immediate release tablet Commonly known as: Roxicodone Take 1 tablet (5 mg total) by mouth every 6 (six) hours. What changed: when to take this   oxyCODONE 5 MG immediate release tablet Commonly known as: Roxicodone Take 1 tablet (5 mg total) by mouth every 4 (four) hours as needed for severe pain or breakthrough pain. Can take tablet for breakthrough on top of her scheduled oxycodone What changed: You were already taking a  medication with the same name, and this prescription was added. Make sure you understand how and when to take each.   thiamine 100 MG tablet Commonly known as: VITAMIN B1 Take 1 tablet (100 mg total) by mouth daily.   Vitamin D (Ergocalciferol) 1.25 MG (50000 UNIT) Caps capsule Commonly known as: DRISDOL Take 1 capsule (50,000 Units total) by mouth every 7 (seven) days. What changed: additional instructions        Follow-up Information     Fulp, Cammie, MD Follow up.   Specialty: Family Medicine Contact information: [place] [address] [phone]         Mansouraty, Telford Nab., MD Follow up.   Specialties: Gastroenterology, Internal Medicine Contact information: [place] [address] [phone]                Allergies  Allergen Reactions   Codeine Other (See Comments)    Migraine     Consultations: GI    Procedures/Studies: CT ABDOMEN PELVIS W CONTRAST  Result Date: 05/23/2022 CLINICAL DATA:  62 year old female with mild pancreatic inflammation suspected on MRI this month. Dilated biliary system with no discrete obstructing etiology on MRI/MRCP. EXAM: CT ABDOMEN AND PELVIS WITH CONTRAST TECHNIQUE: Multidetector CT imaging of the abdomen and pelvis was performed using the standard protocol following bolus administration of intravenous contrast. RADIATION DOSE REDUCTION: This exam was performed according to the departmental dose-optimization program which includes automated exposure control, adjustment of the mA and/or kV according to patient size and/or use of iterative reconstruction technique. CONTRAST:  61mL OMNIPAQUE IOHEXOL 350 MG/ML SOLN COMPARISON:  Abdomen MRI 05/14/2022. CT Abdomen and Pelvis 05/13/2022. FINDINGS: Lower chest: Enhancing dependent atelectasis with trace pleural fluid, mild. No pericardial effusion. Lung base ventilation stable to mildly improved since 05/13/2022. Hepatobiliary: Intra- and  extrahepatic biliary ductal dilatation has not significantly changed since 05/13/2022. This extends to the ampulla as before (coronal image 42). Evidence of adenomyomatosis at the gallbladder fundus. No calculus identified. Liver enhancement remains within normal limits. Pancreas: Stable main pancreatic ductal dilatation most pronounced in the pancreatic head. No definite mass or active inflammation. Spleen: Negative. Adrenals/Urinary Tract: Normal adrenal glands. Kidneys appears stable and nonobstructed with symmetric enhancement and contrast excretion. Unremarkable bladder. Incidental pelvic phleboliths. Stomach/Bowel: Gas distended distal sigmoid colon and proximal rectum. Redundant sigmoid with other retained stool. Some oral contrast mixed with stool in the sigmoid colon. Concentrated oral contrast has reached the mid transverse colon. Redundant splenic flexure with retained stool. Negative right colon and elongated appendix which tracks to the midline on series 3, image 62. Decompressed terminal ileum. Contrast containing small bowel loops are nondilated. Dilute contrast in the stomach. Large and somewhat fusiform diverticula of the duodenum in the right retroperitoneum redemonstrated containing both contrast and flocculated material. This is about 8 cm diameter. No active inflammation identified. No free air or free fluid. Vascular/Lymphatic: Aortoiliac calcified atherosclerosis.  Normal caliber abdominal aorta. No lymphadenopathy identified. Portal venous system is patent. Reproductive: Surgically absent uterus. Diminutive or absent ovaries. Other: Presacral stranding has regressed.  No pelvic free fluid. Musculoskeletal: Probable collapsed and partially calcified breast implants. Osteopenia. Compression fractures of L1 and L3 appears stable. Chronic S4 sacral fracture appears stable. Chronic bilateral sacral ala fractures appear stable, likely insufficiency related. Left hip arthroplasty. No new osseous  abnormality. IMPRESSION: 1. Unchanged intra- and extrahepatic biliary and main pancreatic ductal dilatation. The CBD is dilated to the ampulla as on recent MRI. Superimposed large (8 cm) duodenal diverticulum there, but no active inflammation or other complicating features identified. 2. No pancreatic inflammation by CT. No new abnormality in the abdomen or pelvis. 3. Diffuse osteopenia with stable lumbar compression fractures, sacral fractures. 4.  Aortic Atherosclerosis (ICD10-I70.0). Electronically Signed   By: Odessa FlemingH  Hall M.D.   On: 05/23/2022 07:28   MR BRAIN WO CONTRAST  Result Date: 05/17/2022 CLINICAL DATA:  Mental status change, unknown cause EXAM: MRI HEAD WITHOUT CONTRAST TECHNIQUE: Multiplanar, multiecho pulse sequences of the brain and surrounding structures were obtained without intravenous contrast. COMPARISON:  CT head 05/10/2022. FINDINGS: Brain: No acute infarction, hemorrhage, hydrocephalus, extra-axial collection or mass lesion. 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 Vascular: Major arterial flow voids are maintained at the skull base. Skull and upper cervical spine: Normal marrow signal. Sinuses/Orbits: Mild paranasal sinus mucosal thickening. No acute orbital findings. Other: No sizable mastoid effusions. IMPRESSION: 1. No evidence of acute intracranial abnormality. 2. Many remote lacunar infarcts. Electronically Signed   By: Feliberto HartsFrederick S Jones M.D.   On: 05/17/2022 15:56   MR ABDOMEN MRCP W WO CONTAST  Result Date: 05/14/2022 CLINICAL DATA:  Pancreatitis. EXAM: MRI ABDOMEN WITHOUT AND WITH CONTRAST (INCLUDING MRCP) TECHNIQUE: Multiplanar multisequence MR imaging of the abdomen was performed both before and after the administration of intravenous contrast. Heavily T2-weighted images of the biliary and pancreatic ducts were obtained, and three-dimensional MRCP images were rendered by post processing. CONTRAST:  6mL GADAVIST GADOBUTROL 1 MMOL/ML IV  SOLN COMPARISON:  CT 05/13/2022 FINDINGS: Lower chest: Tiny pleural effusions. There is some linear signal at the bases. Atelectasis is favored. This was seen previously. Hepatobiliary: There is no space-occupying liver lesion identified. Moderate central intrahepatic biliary ductal dilatation. The gallbladder is distended. There is some focal thickening of the gallbladder towards the fundus with some cystic components consistent with areas of adenomyomatosis. Subtle gallbladder wall thickening. Common duct has a diameter of 15 mm. Slight tapering towards the pancreatic head. No obvious filling defect along the axial datasets. Somewhat abrupt transition at the ampulla. Pancreas: The pancreatic duct in the head and neck region is slightly ectatic with diameter of 4 mm. There is a small area of more normal caliber towards the neck and then another areas slight ectasia less of the head towards the proximal body. Duct in the tail is nondilated. Pancreas has some global mild atrophy the abnormal enhancement. The portal venous confluence and splenic vein are patent. No rim enhancing fluid collections. Mild adjacent inflammatory changes. Spleen:  Nonenlarged.  Preserved enhancement and T2 signal. Adrenals/Urinary Tract: Nonspecific thickening of the adrenal glands. No enhancing renal lesion. Stomach/Bowel: The visualized small and large bowel is nondilated. Once again there is large proximal duodenal diverticula. Vascular/Lymphatic: Normal caliber aorta and IVC. Mild atherosclerotic changes. No developing abnormal lymph node enlargement in the visualized abdomen. Other:  None. Musculoskeletal: Scattered degenerative changes along the spine. There is some compression deformity of  the mid lumbar level, L3. This is unchanged from prior CT appears chronic. Slight changes at L1. IMPRESSION: Dilated common bile duct up to 15 mm with some moderate central intrahepatic duct dilatation. No obvious filling defect but slight abrupt  transition at the ampulla. There is also a mild-to-moderately ectatic pancreatic duct without significant stricture. Subtle ampullary lesion is not excluded. No separate pancreatic mass. There is some mild peripancreatic stranding. Please correlate with the provided clinical history of pancreatitis. No well-defined fluid collections. Diffuse mild gallbladder wall thickening with focal area with cystic change towards the fundus consistent with a component of adenomyomatosis. Tiny pleural effusions. Electronically Signed   By: Karen Kays M.D.   On: 05/14/2022 13:16   MR 3D Recon At Scanner  Result Date: 05/14/2022 CLINICAL DATA:  Pancreatitis. EXAM: MRI ABDOMEN WITHOUT AND WITH CONTRAST (INCLUDING MRCP) TECHNIQUE: Multiplanar multisequence MR imaging of the abdomen was performed both before and after the administration of intravenous contrast. Heavily T2-weighted images of the biliary and pancreatic ducts were obtained, and three-dimensional MRCP images were rendered by post processing. CONTRAST:  50mL GADAVIST GADOBUTROL 1 MMOL/ML IV SOLN COMPARISON:  CT 05/13/2022 FINDINGS: Lower chest: Tiny pleural effusions. There is some linear signal at the bases. Atelectasis is favored. This was seen previously. Hepatobiliary: There is no space-occupying liver lesion identified. Moderate central intrahepatic biliary ductal dilatation. The gallbladder is distended. There is some focal thickening of the gallbladder towards the fundus with some cystic components consistent with areas of adenomyomatosis. Subtle gallbladder wall thickening. Common duct has a diameter of 15 mm. Slight tapering towards the pancreatic head. No obvious filling defect along the axial datasets. Somewhat abrupt transition at the ampulla. Pancreas: The pancreatic duct in the head and neck region is slightly ectatic with diameter of 4 mm. There is a small area of more normal caliber towards the neck and then another areas slight ectasia less of the head  towards the proximal body. Duct in the tail is nondilated. Pancreas has some global mild atrophy the abnormal enhancement. The portal venous confluence and splenic vein are patent. No rim enhancing fluid collections. Mild adjacent inflammatory changes. Spleen:  Nonenlarged.  Preserved enhancement and T2 signal. Adrenals/Urinary Tract: Nonspecific thickening of the adrenal glands. No enhancing renal lesion. Stomach/Bowel: The visualized small and large bowel is nondilated. Once again there is large proximal duodenal diverticula. Vascular/Lymphatic: Normal caliber aorta and IVC. Mild atherosclerotic changes. No developing abnormal lymph node enlargement in the visualized abdomen. Other:  None. Musculoskeletal: Scattered degenerative changes along the spine. There is some compression deformity of the mid lumbar level, L3. This is unchanged from prior CT appears chronic. Slight changes at L1. IMPRESSION: Dilated common bile duct up to 15 mm with some moderate central intrahepatic duct dilatation. No obvious filling defect but slight abrupt transition at the ampulla. There is also a mild-to-moderately ectatic pancreatic duct without significant stricture. Subtle ampullary lesion is not excluded. No separate pancreatic mass. There is some mild peripancreatic stranding. Please correlate with the provided clinical history of pancreatitis. No well-defined fluid collections. Diffuse mild gallbladder wall thickening with focal area with cystic change towards the fundus consistent with a component of adenomyomatosis. Tiny pleural effusions. Electronically Signed   By: Karen Kays M.D.   On: 05/14/2022 13:16   CT ABDOMEN PELVIS W CONTRAST  Result Date: 05/13/2022 CLINICAL DATA:  Acute severe pancreatitis. EXAM: CT ABDOMEN AND PELVIS WITH CONTRAST TECHNIQUE: Multidetector CT imaging of the abdomen and pelvis was performed using the standard protocol  following bolus administration of intravenous contrast. RADIATION DOSE  REDUCTION: This exam was performed according to the departmental dose-optimization program which includes automated exposure control, adjustment of the mA and/or kV according to patient size and/or use of iterative reconstruction technique. CONTRAST:  70mL OMNIPAQUE IOHEXOL 350 MG/ML SOLN COMPARISON:  No prior abdomen pelvis CT. A CTA chest is available for review from 11/06/2016. CT studies of the pelvis without contrast are also available from 04/03/2022 and 04/19/2018, and also CT lumbar spine 04/03/2022. FINDINGS: Lower chest: Small layering right and trace left pleural effusions. There is mild compressive atelectatic effect in the lower lobes without infiltrates. Chronic mild elevation right hemidiaphragm. Lung bases are otherwise clear. The cardiac size is normal. Symmetric bilateral dense calcifications in both breasts most likely due to fibrocystic disease, alternatively could relate to prior breast implant removal. This was seen previously. Hepatobiliary: There is intrahepatic and extrahepatic biliary dilatation. The intrahepatic biliary dilatation is greatest in the left lobe. The common bile duct measuring up to 1.3 cm. There is no calcified intraductal stone or ductal mass. Etiology could be due to ampullary stricture, spasm or small ampullary mass although no mass is definitively seen. There is mild thickening of the gallbladder free wall and trace pericholecystic fluid. No calcified gallstone is seen. Findings could be congestive etiology, reactive due to adjacent pancreatitis, or acalculous cholecystitis. The liver is normal in size without mass enhancement. Pancreas: There are stranding changes along side the head, uncinate process and neck of the pancreas. Mild pancreatic ductal dilatation greater proximally. No pancreatic mass enhancement is seen. Findings consistent with acute interstitial pancreatitis. There is no peripancreatic hemorrhage, abscess or free air. There is trace nonlocalizing fluid  inferior to the pancreas and posterior to the proximal pancreas. There is no focal pancreatic hypoenhancement concern for pancreatic necrosis. Inflammatory changes extend into the pancreaticoduodenal groove. Spleen: No abnormality. Adrenals/Urinary Tract: No adrenal or renal mass is seen. There is no hydronephrosis. There is contrast in the collecting systems. Intrarenal stones could be obscured but there is no hydronephrosis no visible ureteral filling defects. The left posterolateral bladder wall is obscured by spray artifact from a left hip replacement. The bladder wall is normal where visible. Stomach/Bowel: Thickened folds are again noted in the stomach similar to 2018, possible chronic gastritis. There is duodenal fold thickening which is probably reactive due to the pancreatitis. There is a 6.4 x 5 by 4.5 cm duodenal diverticulum at the junction of the descending and horizontal sections, with retained contrast. Rest of the small bowel is unremarkable and fills well with contrast. The appendix is normal. There is contrast in the appendix. No focal abnormality of the large bowel wall. Vascular/Lymphatic: Aortic atherosclerosis. No enlarged abdominal or pelvic lymph nodes. Reproductive: Status post hysterectomy. No adnexal masses. There are multiple pelvic phleboliths. Musculoskeletal: There is interval linear sclerotic healing response laterally in both sacral ala with the last pelvic CT showing acute sacral insufficiency fractures. Sacral fracture lines are still partially visible compared with 04/03/2022. There is additional linear healing response in the left pubic bone with left pubic fracture previously noted. No new fracture has become apparent. Chronic compression fractures of L1 and 3 are redemonstrated unchanged. Diffuse osteopenia is greater than expected for a 62 year old. Left hip replacement again noted. Mild degenerative change lumbar spine. Other: Small ascites in the presacral pelvis. No large  drainable pocket. No free air, hemorrhage or abscess. There are no incarcerated hernias. There are scattered air pockets in the subcutaneous plane in the  abdominal wall most likely reflecting sites of recent injection. Please correlate clinically. IMPRESSION: 1. Acute interstitial proximal pancreatitis without evidence of pancreatic necrosis, hemorrhage or abscess. 2. There is intrahepatic and extrahepatic biliary dilatation, with the common bile duct measuring up to 1.3 cm. No calcified intraductal stone or mass is seen. Etiology could be due to ampullary stricture, spasm or small ampullary mass. 3. Mild gallbladder wall thickening and trace pericholecystic fluid. No calcified gallstone. Findings could be congestive, reactive due to adjacent pancreatitis, or from acalculous cholecystitis. 4. 6.4 x 5 x 4.5 cm duodenal diverticulum at the junction of the descending and horizontal portions. 5. Aortic atherosclerosis. 6. Small right and trace left pleural effusions. 7. Healing bilateral sacral insufficiency fractures and left pubic bone fracture. 8. Osteopenia and degenerative change. 9. Air pockets in the subcutaneous abdominal wall most likely due to recent injections. Aortic Atherosclerosis (ICD10-I70.0). Electronically Signed   By: Almira BarKeith  Chesser M.D.   On: 05/13/2022 21:18   DG Abd 1 View  Result Date: 05/12/2022 CLINICAL DATA:  62 year old female with vomiting EXAM: ABDOMEN - 1 VIEW COMPARISON:  None Available. FINDINGS: Nonspecific bowel gas pattern. Gas is noted throughout loops of small bowel without significant dilation. No air-fluid levels on this supine radiograph. No large free air. Left hip arthroplasty. IMPRESSION: Nonspecific bowel gas pattern with scattered air throughout small and large bowel. No obvious obstruction or free air. Electronically Signed   By: Malachy MoanHeath  McCullough M.D.   On: 05/12/2022 15:43   DG Chest Port 1 View  Result Date: 05/10/2022 CLINICAL DATA:  Altered mental status EXAM:  PORTABLE CHEST 1 VIEW COMPARISON:  04/19/2018 FINDINGS: The lungs appear clear. Cardiac and mediastinal contours normal. No pleural effusion identified. Unchanged appearance of the right proximal humeral fixator. There is a suggestion of deformity of the right distal clavicle, likely from a remote healed fracture. IMPRESSION: 1. No active cardiopulmonary disease is radiographically apparent. 2. Suspected remote healed fracture of the right distal clavicle. Electronically Signed   By: Gaylyn RongWalter  Liebkemann M.D.   On: 05/10/2022 15:59   CT Head Wo Contrast  Result Date: 05/10/2022 CLINICAL DATA:  Altered mental status EXAM: CT HEAD WITHOUT CONTRAST TECHNIQUE: Contiguous axial images were obtained from the base of the skull through the vertex without intravenous contrast. RADIATION DOSE REDUCTION: This exam was performed according to the departmental dose-optimization program which includes automated exposure control, adjustment of the mA and/or kV according to patient size and/or use of iterative reconstruction technique. COMPARISON:  CT head 11/05/16, MRI head 11/08/16 FINDINGS: Brain: No evidence of acute infarction, hemorrhage, hydrocephalus, extra-axial collection or mass lesion/mass effect. There is sequela of severe chronic microvascular ischemic change with multiple superimposed basal ganglia infarcts. 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 Skull: Normal. Negative for fracture or focal lesion. Sinuses/Orbits: Likely bilateral maxillary sinus mucous retention cysts. Paranasal sinuses are otherwise clear. Orbits are unremarkable. No mastoid or middle ear effusion. Other: None IMPRESSION: 1. 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 2. No intracranial hemorrhage. 3. Sequela of severe chronic microvascular ischemic change with multiple superimposed basal ganglia infarcts. Electronically  Signed   By: Lorenza CambridgeHemant  Desai M.D.   On: 05/10/2022 15:14       Discharge Exam: Vitals:   05/24/22 2058 05/25/22 0545  BP: 131/64 120/69  Pulse: 76 76  Resp: 18 16  Temp: 98.8 F (37.1 C) 98.4 F (36.9 C)  SpO2: 97% 92%    General: Pt is alert, awake, not in acute distress Cardiovascular: RRR, S1/S2 +, no edema Respiratory: CTA bilaterally, no wheezing, no rhonchi, no respiratory distress, no conversational dyspnea  Abdominal: Soft, TTP  Extremities: no edema, no cyanosis Psych: Normal mood and affect, stable judgement and insight     The results of significant diagnostics from this hospitalization (including imaging, microbiology, ancillary and laboratory) are listed below for reference.     Microbiology: No results found for this or any previous visit (from the past 240 hour(s)).   Labs: BNP (last 3 results) No results for input(s): "BNP" in the last 8760 hours. Basic Metabolic Panel: Recent Labs  Lab 05/22/22 0200 05/24/22 0653 05/25/22 0639  NA 136 139 139  K 4.5 3.9 3.9  CL 97* 103 103  CO2 28 28 28   GLUCOSE 135* 100* 99  BUN 11 5* 9  CREATININE 0.89 0.64 0.67  CALCIUM 9.5 8.4* 8.2*   Liver Function Tests: Recent Labs  Lab 05/22/22 0200 05/24/22 0653 05/25/22 0639  AST 199* 188* 76*  ALT 115* 91* 64*  ALKPHOS 285* 172* 168*  BILITOT 0.8 0.7 0.3  PROT 7.6 5.2* 5.5*  ALBUMIN 3.6 2.5* 2.5*   Recent Labs  Lab 05/22/22 0200  LIPASE 2,099*   No results for input(s): "AMMONIA" in the last 168 hours. CBC: Recent Labs  Lab 05/22/22 0200  WBC 8.7  NEUTROABS 6.8  HGB 16.5*  HCT 50.4*  MCV 95.8  PLT 275   Cardiac Enzymes: No results for input(s): "CKTOTAL", "CKMB", "CKMBINDEX", "TROPONINI" in the last 168 hours. BNP: Invalid input(s): "POCBNP" CBG: Recent Labs  Lab 05/18/22 1148 05/18/22 1612 05/18/22 1928  GLUCAP 115* 145* 116*   D-Dimer No results for input(s): "DDIMER" in the last 72 hours. Hgb A1c No results for input(s):  "HGBA1C" in the last 72 hours. Lipid Profile No results for input(s): "CHOL", "HDL", "LDLCALC", "TRIG", "CHOLHDL", "LDLDIRECT" in the last 72 hours. Thyroid function studies No results for input(s): "TSH", "T4TOTAL", "T3FREE", "THYROIDAB" in the last 72 hours.  Invalid input(s): "FREET3" Anemia work up No results for input(s): "VITAMINB12", "FOLATE", "FERRITIN", "TIBC", "IRON", "RETICCTPCT" in the last 72 hours. Urinalysis    Component Value Date/Time   COLORURINE YELLOW 05/22/2022 0500   APPEARANCEUR HAZY (A) 05/22/2022 0500   LABSPEC 1.016 05/22/2022 0500   PHURINE 8.0 05/22/2022 0500   GLUCOSEU NEGATIVE 05/22/2022 0500   HGBUR NEGATIVE 05/22/2022 0500   BILIRUBINUR NEGATIVE 05/22/2022 0500   KETONESUR 5 (A) 05/22/2022 0500   PROTEINUR 30 (A) 05/22/2022 0500   NITRITE NEGATIVE 05/22/2022 0500   LEUKOCYTESUR SMALL (A) 05/22/2022 0500   Sepsis Labs Recent Labs  Lab 05/22/22 0200  WBC 8.7   Microbiology No results found for this or any previous visit (from the past 240 hour(s)).   Patient was seen and examined on the day of discharge and was found to be in stable condition. Time coordinating discharge: 35 minutes including assessment and coordination of care, as well as examination of the patient.   SIGNED:  05/24/22, DO [facility] 05/25/2022, 9:58 AM

## 2022-05-25 NOTE — TOC Transition Note (Signed)
Transition of Care [facility]) - CM/SW Discharge Note   Patient Details  Name: Rose Bridges MRN: [id number] Date of Birth: April 27, 1961  [facility] CM/SW Contact:  Amador Cunas, [place] Phone Number: 05/25/2022, 1:08 PM   Clinical Narrative:  Pt for dc back to [facility] today. PT's SIL Heather aware of dc and reports agreeable. Confirmed with Lorenza Chick at [facility] they are prepared to admit pt to room 604P. RN provided with number for report and [name] arranged for transport. SW signing off at dc.   Wandra Feinstein, MSW, LCSW [phone] (coverage)       Final next level of care: Skilled Nursing Facility Barriers to Discharge: No Barriers Identified   Patient Goals and CMS Choice      Discharge Placement                Patient chooses bed at: [facility] Patient to be transferred to facility by: [place] Name of family member notified: SIL Heather Patient and family notified of of transfer: 05/25/22  Discharge Plan and Services Additional resources added to the After Visit Summary for                                       Social Determinants of Health (SDOH) Interventions SDOH Screenings   Food Insecurity: Food Insecurity Present (05/22/2022)  Housing: Low Risk  (05/22/2022)  Transportation Needs: No Transportation Needs (05/22/2022)  Recent Concern: Transportation Needs - Unmet Transportation Needs (05/10/2022)  Utilities: Not At Risk (05/22/2022)  Depression (PHQ2-9): Medium Risk (09/09/2019)  Tobacco Use: High Risk (05/22/2022)     Readmission Risk Interventions     No data to display

## 2022-05-29 ENCOUNTER — Other Ambulatory Visit: Payer: Self-pay

## 2022-05-29 ENCOUNTER — Emergency Department (HOSPITAL_COMMUNITY): Payer: Medicare Other

## 2022-05-29 ENCOUNTER — Emergency Department (HOSPITAL_COMMUNITY)
Admission: EM | Admit: 2022-05-29 | Discharge: 2022-05-30 | Disposition: A | Discharge status: Home / Self Care | Payer: Medicare Other | Attending: Emergency Medicine | Admitting: Emergency Medicine

## 2022-05-29 DIAGNOSIS — I10 Essential (primary) hypertension: Secondary | ICD-10-CM | POA: Insufficient documentation

## 2022-05-29 DIAGNOSIS — R109 Unspecified abdominal pain: Secondary | ICD-10-CM | POA: Diagnosis present

## 2022-05-29 DIAGNOSIS — K861 Other chronic pancreatitis: Secondary | ICD-10-CM | POA: Insufficient documentation

## 2022-05-29 DIAGNOSIS — F17210 Nicotine dependence, cigarettes, uncomplicated: Secondary | ICD-10-CM | POA: Insufficient documentation

## 2022-05-29 LAB — CBC
HCT: 43.6 % (ref 36.0–46.0)
Hemoglobin: 13.7 g/dL (ref 12.0–15.0)
MCH: 31.1 pg (ref 26.0–34.0)
MCHC: 31.4 g/dL (ref 30.0–36.0)
MCV: 99.1 fL (ref 80.0–100.0)
Platelets: 337 10*3/uL (ref 150–400)
RBC: 4.4 MIL/uL (ref 3.87–5.11)
RDW: 12.9 % (ref 11.5–15.5)
WBC: 7.1 10*3/uL (ref 4.0–10.5)
nRBC: 0 % (ref 0.0–0.2)

## 2022-05-29 LAB — COMPREHENSIVE METABOLIC PANEL
ALT: 32 U/L (ref 0–44)
AST: 40 U/L (ref 15–41)
Albumin: 3 g/dL — ABNORMAL LOW (ref 3.5–5.0)
Alkaline Phosphatase: 146 U/L — ABNORMAL HIGH (ref 38–126)
Anion gap: 8 (ref 5–15)
BUN: 15 mg/dL (ref 8–23)
CO2: 29 mmol/L (ref 22–32)
Calcium: 9 mg/dL (ref 8.9–10.3)
Chloride: 101 mmol/L (ref 98–111)
Creatinine, Ser: 0.8 mg/dL (ref 0.44–1.00)
GFR, Estimated: >60 mL/min (ref >60)
Glucose, Bld: 100 mg/dL — ABNORMAL HIGH (ref 70–99)
Potassium: 4.3 mmol/L (ref 3.5–5.1)
Sodium: 138 mmol/L (ref 135–145)
Total Bilirubin: 0.3 mg/dL (ref 0.3–1.2)
Total Protein: 6.2 g/dL — ABNORMAL LOW (ref 6.5–8.1)

## 2022-05-29 LAB — LIPASE, BLOOD: Lipase: 56 U/L — ABNORMAL HIGH (ref 11–51)

## 2022-05-29 MED ORDER — ONDANSETRON HCL 4 MG/2ML IJ SOLN
4.0000 mg | Freq: Once | INTRAMUSCULAR | Status: AC
  Administered 2022-05-29: 4 mg via INTRAVENOUS
  Filled 2022-05-29: qty 2

## 2022-05-29 MED ORDER — MORPHINE SULFATE (PF) 4 MG/ML IV SOLN
4.0000 mg | Freq: Once | INTRAVENOUS | Status: AC
  Administered 2022-05-29: 4 mg via INTRAVENOUS
  Filled 2022-05-29: qty 1

## 2022-05-29 MED ORDER — SODIUM CHLORIDE 0.9 % IV BOLUS
1000.0000 mL | Freq: Once | INTRAVENOUS | Status: AC
  Administered 2022-05-29: 1000 mL via INTRAVENOUS

## 2022-05-29 MED ORDER — IOHEXOL 350 MG/ML SOLN
75.0000 mL | Freq: Once | INTRAVENOUS | Status: AC | PRN
  Administered 2022-05-29: 75 mL via INTRAVENOUS

## 2022-05-29 NOTE — ED Notes (Signed)
RN attempted 2 IV sticks both unsuccessful. IV team consulted.

## 2022-05-29 NOTE — ED Provider Triage Note (Addendum)
Emergency Medicine Provider Triage Evaluation Note  Rose Bridges , a 62 y.o. female  was evaluated in triage.  She complains of severe upper abdominal pain consistent with pancreatitis that was diagnosed earlier this month.  States that when she got out of the hospital she was sent to a nursing facility and they are not adequately treating her pain.  States that she is on OxyContin at the facility but was told by a nurse here at [facility] that this dose would be increased when she was sent to the nursing facility but this has not happened.  Patient denies nausea, vomiting, fever, chills, chest pain, shortness of breath.  States that she believes the last time she had pain medication was at 6 AM this morning but is unable to confirm.  Review of Systems  Positive: See HPI Negative: See HPI  Physical Exam  BP 91/65 (BP Location: Right Arm)   Pulse 73   Temp 98.7 F (37.1 C) (Oral)   Resp 18   SpO2 98%  Gen:   Awake, no distress   Resp:  Normal effort  MSK:   Moves extremities without difficulty  Other:  Abdomen diffusely tender, no distension, appears distractible  Medical Decision Making  Medically screening exam initiated at 1:07 PM.  Appropriate orders placed.  Yarieliz Wasser was informed that the remainder of the evaluation will be completed by another provider, this initial triage assessment does not replace that evaluation, and the importance of remaining in the ED until their evaluation is complete.         Suzzette Righter, PA-C 05/29/22 1328

## 2022-05-29 NOTE — ED Provider Notes (Signed)
[place] EMERGENCY DEPARTMENT AT [facility] Provider Note  CSN: [id number] Arrival date & time: 05/29/22 1247  Chief Complaint(s) Abdominal Pain  HPI Rose Bridges is a 62 y.o. female with history of hypertension, hyperlipidemia, prior pancreatitis, presenting to the emergency department with abdominal pain.  Patient had multiple falls recently, staying at nursing home.  She was recently admitted for pancreatitis.  She reports that since going to the nursing home, she has had persistent uncontrolled pain in her epigastric region.  She also reports nausea and vomiting and decreased p.o. intake.  No fevers or chills.  No dysuria.  No chest pain or shortness of breath.  No new falls.  No hematemesis.  No melena or diarrhea.   Past Medical History Past Medical History:  Diagnosis Date   Hyperlipemia    Hypertension    Osteogenesis imperfecta    Osteoporosis    Patient Active Problem List   Diagnosis Date Noted   Alcohol dependence (HCC) 05/22/2022   Delirium due to another medical condition, acute, hyperactive 05/18/2022   Acute pancreatitis without infection or necrosis 05/14/2022   Abnormal findings on imaging of biliary tract 05/14/2022   AMS (altered mental status) 05/10/2022   Pelvic fracture (HCC) 04/03/2022   Tobacco use 04/03/2022   Osteogenesis imperfecta 04/28/2018   Seizure (HCC) 03/26/2017   Home Medication(s) Prior to Admission medications   Medication Sig Start Date End Date Taking? Authorizing Provider  acetaminophen (TYLENOL) 500 MG tablet Take 1,000 mg by mouth 3 (three) times daily. 04/10/22   [provider]  Baclofen 5 MG TABS Take 1 tablet by mouth every 8 (eight) hours as needed for muscle spasms. 04/07/22   Celine Mans, MD  calcitonin, salmon, (MIACALCIN/FORTICAL) 200 UNIT/ACT nasal spray Place 1 spray into alternate nostrils daily. 04/08/22   Celine Mans, MD  calcium carbonate (TUMS - DOSED IN MG ELEMENTAL CALCIUM) 500 MG  chewable tablet Chew 1 tablet by mouth 2 (two) times daily.    [provider]  folic acid (FOLVITE) 1 MG tablet Take 1 tablet (1 mg total) by mouth daily. 09/09/19   Storm Frisk, MD  hydrOXYzine (ATARAX) 10 MG tablet Take 1 tablet (10 mg total) by mouth 3 (three) times daily as needed for anxiety. 05/25/22   Noralee Stain, DO  levETIRAcetam (KEPPRA) 500 MG tablet Take 1 tablet (500 mg total) by mouth 2 (two) times daily. 05/18/22   Leatha Gilding, MD  magnesium 30 MG tablet Take 1 tablet (30 mg total) by mouth 2 (two) times daily. 05/18/22   Leatha Gilding, MD  oxyCODONE (ROXICODONE) 5 MG immediate release tablet Take 1 tablet (5 mg total) by mouth every 6 (six) hours. 05/25/22   Noralee Stain, DO  oxyCODONE (ROXICODONE) 5 MG immediate release tablet Take 1 tablet (5 mg total) by mouth every 4 (four) hours as needed for severe pain or breakthrough pain. Can take tablet for breakthrough on top of her scheduled oxycodone 05/25/22   Noralee Stain, DO  thiamine 100 MG tablet Take 1 tablet (100 mg total) by mouth daily. 09/09/19   Storm Frisk, MD  Vitamin D, Ergocalciferol, (DRISDOL) 1.25 MG (50000 UNIT) CAPS capsule Take 1 capsule (50,000 Units total) by mouth every 7 (seven) days. Patient taking differently: Take 50,000 Units by mouth every 7 (seven) days. Thursdays 04/12/22   Celine Mans, MD  Past Surgical History Past Surgical History:  Procedure Laterality Date   ABDOMINAL HYSTERECTOMY     HIP ARTHROPLASTY Left 09/26/2015   Procedure: ARTHROPLASTY BIPOLAR HIP (HEMIARTHROPLASTY);  Surgeon: Renette Butters, MD;  Location: [place];  Service: Orthopedics;  Laterality: Left;   ORIF HUMERUS FRACTURE Right 11/06/2016   Procedure: OPEN REDUCTION INTERNAL FIXATION (ORIF) PROXIMAL HUMERUS FRACTURE;  Surgeon: Renette Butters, MD;  Location: [place];   Service: Orthopedics;  Laterality: Right;   Family History Family History  Problem Relation Age of Onset   Stroke Mother    Heart attack Mother    Hypertension Mother    Stroke Sister    Stroke Brother    Lung cancer Father     Social History Social History   Tobacco Use   Smoking status: Every Day    Packs/day: 0.25    Types: Cigarettes   Smokeless tobacco: Never  Vaping Use   Vaping Use: Never used  Substance Use Topics   Alcohol use: No    Alcohol/week: 85.0 standard drinks of alcohol    Types: 85 Shots of liquor per week    Comment: No alcohol use since 11/04/16.   Drug use: No   Allergies Codeine  Review of Systems Review of Systems  All other systems reviewed and are negative.   Physical Exam Vital Signs  I have reviewed the triage vital signs BP 123/65 (BP Location: Right Arm)   Pulse 77   Temp 98.5 F (36.9 C) (Oral)   Resp 16   SpO2 99%  Physical Exam Vitals and nursing note reviewed.  Constitutional:      General: She is not in acute distress.    Appearance: She is well-developed.  HENT:     Head: Normocephalic and atraumatic.     Mouth/Throat:     Mouth: Mucous membranes are moist.  Eyes:     Pupils: Pupils are equal, round, and reactive to light.  Cardiovascular:     Rate and Rhythm: Normal rate and regular rhythm.     Heart sounds: No murmur heard. Pulmonary:     Effort: Pulmonary effort is normal. No respiratory distress.     Breath sounds: Normal breath sounds.  Abdominal:     General: Abdomen is flat.     Palpations: Abdomen is soft.     Tenderness: There is abdominal tenderness in the epigastric area.  Musculoskeletal:        General: No tenderness.     Right lower leg: No edema.     Left lower leg: No edema.  Skin:    General: Skin is warm and dry.  Neurological:     General: No focal deficit present.     Mental Status: She is alert. Mental status is at baseline.  Psychiatric:        Mood and Affect: Mood normal.         Behavior: Behavior normal.     ED Results and Treatments Labs (all labs ordered are listed, but only abnormal results are displayed) Labs Reviewed  LIPASE, BLOOD - Abnormal; Notable for the following components:      Result Value   Lipase 56 (*)    All other components within normal limits  COMPREHENSIVE METABOLIC PANEL - Abnormal; Notable for the following components:   Glucose, Bld 100 (*)    Total Protein 6.2 (*)    Albumin 3.0 (*)    Alkaline Phosphatase 146 (*)    All other components within normal limits  CBC  URINALYSIS, ROUTINE W REFLEX MICROSCOPIC                                                                                                                          Radiology CT Abdomen Pelvis W Contrast  Result Date: 05/29/2022 CLINICAL DATA:  Acute pancreatitis.  Epigastric pain. EXAM: CT ABDOMEN AND PELVIS WITH CONTRAST TECHNIQUE: Multidetector CT imaging of the abdomen and pelvis was performed using the standard protocol following bolus administration of intravenous contrast. RADIATION DOSE REDUCTION: This exam was performed according to the departmental dose-optimization program which includes automated exposure control, adjustment of the mA and/or kV according to patient size and/or use of iterative reconstruction technique. CONTRAST:  26mL OMNIPAQUE IOHEXOL 350 MG/ML SOLN COMPARISON:  CT abdomen and pelvis 05/22/2022. MRI abdomen 05/14/2022. FINDINGS: Lower chest: There are bands of atelectasis or scarring in the lung bases. Hepatobiliary: There is intra and extrahepatic biliary ductal dilatation (moderate) which is similar to prior. No gallstones are seen. There are no focal liver lesions identified. Pancreas: Unremarkable. No pancreatic ductal dilatation or surrounding inflammatory changes. Spleen: Normal in size without focal abnormality. Adrenals/Urinary Tract: Adrenal glands are unremarkable. Kidneys are normal, without renal calculi, focal lesion, or hydronephrosis.  Bladder is unremarkable. Stomach/Bowel: Appendicoliths present. Appendix is normal in size without surrounding inflammation. There is no bowel obstruction, pneumatosis, focal wall thickening or free air. There is a large amount of stool throughout the colon. The stomach is decompressed. 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 Vascular/Lymphatic: Aortic atherosclerosis. No enlarged abdominal or pelvic lymph nodes. Reproductive: Status post hysterectomy. No adnexal masses. Other: No abdominal wall hernia or abnormality. No abdominopelvic ascites. Musculoskeletal: Mild chronic compression deformities of L1 and L3 appear stable. Left hip arthroplasty is present. IMPRESSION: 1. No acute localizing process in the abdomen or pelvis. 2. Stable moderate intra and extrahepatic biliary ductal dilatation. 3. Stable large duodenal diverticula. 4. Large amount of stool throughout the colon. Aortic Atherosclerosis (ICD10-I70.0). Electronically Signed   By: Ronney Asters M.D.   On: 05/29/2022 21:16    Pertinent labs & imaging results that were available during my care of the patient were reviewed by me and considered in my medical decision making (see MDM for details).  Medications Ordered in ED Medications  morphine (PF) 4 MG/ML injection 4 mg (4 mg Intravenous Given 05/29/22 2018)  ondansetron (ZOFRAN) injection 4 mg (4 mg Intravenous Given 05/29/22 2018)  sodium chloride 0.9 % bolus 1,000 mL (1,000 mLs Intravenous New Bag/Given 05/29/22 2019)  iohexol (OMNIPAQUE) 350 MG/ML injection 75 mL (75 mLs Intravenous Contrast Given 05/29/22 2050)  Procedures Procedures  (including critical care time)  Medical Decision Making / ED Course   MDM:  62 year old female presenting to the emergency department with abdominal pain.  Patient does have some epigastric tenderness on  physical exam.  Suspect most likely cause is persistent pancreatitis.  Will check labs to evaluate for electrolyte disturbance or dehydration.  Will obtain CT scan to evaluate for other acute intra-abdominal pathology or progression of pancreatitis given age and tenderness.  No other symptoms to suggest alternative process and patient reports symptoms are the same as when she was diagnosed pancreatitis.  She is receiving oxycodone at her nursing home but reports that this is not sufficient for her.  Clinical Course as of 05/30/22 0013  Wed May 30, 2022  0012 CT scan without evidence of acute process.  Patient is able to tolerate p.o. in the emergency department.  Suspect residual symptoms from her improving pancreatitis. Will discharge patient to facility. All questions answered. Patient comfortable with plan of discharge. Return precautions discussed with patient and specified on the after visit summary.  [WS]    Clinical Course User Index [WS] Cristie Hem, MD     Additional history obtained: -Additional history obtained from ems -External records from outside source obtained and reviewed including: Chart review including previous notes, labs, imaging, consultation notes including recent discharge for pancreatitis   Lab Tests: -I ordered, reviewed, and interpreted labs.   The pertinent results include:   Labs Reviewed  LIPASE, BLOOD - Abnormal; Notable for the following components:      Result Value   Lipase 56 (*)    All other components within normal limits  COMPREHENSIVE METABOLIC PANEL - Abnormal; Notable for the following components:   Glucose, Bld 100 (*)    Total Protein 6.2 (*)    Albumin 3.0 (*)    Alkaline Phosphatase 146 (*)    All other components within normal limits  CBC  URINALYSIS, ROUTINE W REFLEX MICROSCOPIC    Notable for minimally elevated lipase   Imaging Studies ordered: I ordered imaging studies including CT A/P On my interpretation imaging  demonstrates no acute process I independently visualized and interpreted imaging. I agree with the radiologist interpretation   Medicines ordered and prescription drug management: Meds ordered this encounter  Medications   morphine (PF) 4 MG/ML injection 4 mg   ondansetron (ZOFRAN) injection 4 mg   sodium chloride 0.9 % bolus 1,000 mL   iohexol (OMNIPAQUE) 350 MG/ML injection 75 mL    -I have reviewed the patients home medicines and have made adjustments as needed   Cardiac Monitoring: The patient was maintained on a cardiac monitor.  I personally viewed and interpreted the cardiac monitored which showed an underlying rhythm of: NSR  Social Determinants of Health:  Diagnosis or treatment significantly limited by social determinants of health: current smoker   Reevaluation: After the interventions noted above, I reevaluated the patient and found that they have improved  Co morbidities that complicate the patient evaluation  Past Medical History:  Diagnosis Date   Hyperlipemia    Hypertension    Osteogenesis imperfecta    Osteoporosis       Dispostion: Disposition decision including need for hospitalization was considered, and patient discharged from emergency department.    Final Clinical Impression(s) / ED Diagnoses Final diagnoses:  Chronic pancreatitis, unspecified pancreatitis type [facility])     This chart was dictated using voice recognition software.  Despite best efforts to proofread,  errors can occur which can  change the documentation meaning.    Lonell Grandchild, MD 05/30/22 [phone]

## 2022-05-29 NOTE — ED Triage Notes (Signed)
Patient via EMS from [facility] with hx pancreatitis for abdominal pain. Patient has received all her scheduled pain medication as well as her PRN meds and states to the staff, "I just want to go to the hospital and get all of the pain medication I can get."

## 2022-05-30 DIAGNOSIS — K861 Other chronic pancreatitis: Secondary | ICD-10-CM | POA: Diagnosis not present

## 2022-05-30 NOTE — ED Notes (Signed)
RN called report to [facility] and spoke to [place]. Secretary notified to arrange transportation.

## 2022-05-31 ENCOUNTER — Other Ambulatory Visit: Payer: Self-pay

## 2022-06-01 ENCOUNTER — Other Ambulatory Visit: Payer: Self-pay

## 2022-06-01 ENCOUNTER — Emergency Department (HOSPITAL_COMMUNITY)
Admission: EM | Admit: 2022-06-01 | Discharge: 2022-06-01 | Disposition: A | Discharge status: Home / Self Care | Payer: Medicare Other | Attending: Emergency Medicine | Admitting: Emergency Medicine

## 2022-06-01 ENCOUNTER — Encounter (HOSPITAL_COMMUNITY): Payer: Self-pay

## 2022-06-01 DIAGNOSIS — R109 Unspecified abdominal pain: Secondary | ICD-10-CM

## 2022-06-01 DIAGNOSIS — F17210 Nicotine dependence, cigarettes, uncomplicated: Secondary | ICD-10-CM | POA: Diagnosis not present

## 2022-06-01 DIAGNOSIS — I10 Essential (primary) hypertension: Secondary | ICD-10-CM | POA: Diagnosis not present

## 2022-06-01 DIAGNOSIS — K5900 Constipation, unspecified: Secondary | ICD-10-CM | POA: Insufficient documentation

## 2022-06-01 LAB — COMPREHENSIVE METABOLIC PANEL
ALT: 19 U/L (ref 0–44)
AST: 27 U/L (ref 15–41)
Albumin: 3.5 g/dL (ref 3.5–5.0)
Alkaline Phosphatase: 143 U/L — ABNORMAL HIGH (ref 38–126)
Anion gap: 9 (ref 5–15)
BUN: 13 mg/dL (ref 8–23)
CO2: 25 mmol/L (ref 22–32)
Calcium: 9.5 mg/dL (ref 8.9–10.3)
Chloride: 101 mmol/L (ref 98–111)
Creatinine, Ser: 0.66 mg/dL (ref 0.44–1.00)
GFR, Estimated: >60 mL/min (ref >60)
Glucose, Bld: 132 mg/dL — ABNORMAL HIGH (ref 70–99)
Potassium: 4.3 mmol/L (ref 3.5–5.1)
Sodium: 135 mmol/L (ref 135–145)
Total Bilirubin: 0.6 mg/dL (ref 0.3–1.2)
Total Protein: 7.3 g/dL (ref 6.5–8.1)

## 2022-06-01 LAB — CBC
HCT: 47.4 % — ABNORMAL HIGH (ref 36.0–46.0)
Hemoglobin: 15.6 g/dL — ABNORMAL HIGH (ref 12.0–15.0)
MCH: 31.3 pg (ref 26.0–34.0)
MCHC: 32.9 g/dL (ref 30.0–36.0)
MCV: 95.2 fL (ref 80.0–100.0)
Platelets: 356 10*3/uL (ref 150–400)
RBC: 4.98 MIL/uL (ref 3.87–5.11)
RDW: 13 % (ref 11.5–15.5)
WBC: 12.8 10*3/uL — ABNORMAL HIGH (ref 4.0–10.5)
nRBC: 0 % (ref 0.0–0.2)

## 2022-06-01 LAB — URINALYSIS, ROUTINE W REFLEX MICROSCOPIC
Bilirubin Urine: NEGATIVE
Glucose, UA: NEGATIVE mg/dL
Hgb urine dipstick: NEGATIVE
Ketones, ur: 5 mg/dL — AB
Leukocytes,Ua: NEGATIVE
Nitrite: NEGATIVE
Protein, ur: NEGATIVE mg/dL
Specific Gravity, Urine: 1.008 (ref 1.005–1.030)
pH: 5 (ref 5.0–8.0)

## 2022-06-01 LAB — LIPASE, BLOOD: Lipase: 43 U/L (ref 11–51)

## 2022-06-01 MED ORDER — SODIUM CHLORIDE 0.9 % IV BOLUS
1000.0000 mL | Freq: Once | INTRAVENOUS | Status: AC
  Administered 2022-06-01: 1000 mL via INTRAVENOUS

## 2022-06-01 NOTE — ED Notes (Signed)
In & Out cath patient for urine sample.

## 2022-06-01 NOTE — ED Triage Notes (Signed)
Patient BIB [facility] from [facility] for evaluation of constipation x3 days, patient was discharged from hospital four days ago for pancreatitis admission. Patient states she was given a white liquid at [facility] to treat her constipation but states it was not Miralax because she does not have a prescription for miralax. Patient's MAR from [facility] shows she has a PRN order for Miralax. Patient is alert, oriented, and in no apparent distress at this time.

## 2022-06-01 NOTE — ED Provider Triage Note (Signed)
Emergency Medicine Provider Triage Evaluation Note  Rose Bridges , a 62 y.o. female  was evaluated in triage.  Pt complains of abdominal pain.  Patient is here from [facility].  She states she is here for constipation and pancreatitis.  She gives a somewhat disorganized history stating that she was here for the pancreatitis, was unhappy with her care and unhappy with EMS transport back as well.  She states that today EMS showed up and told her she had to come back.  She reports she was called back.  Review of Systems  Positive: Abdominal pain Negative: fever  Physical Exam  BP 132/84 (BP Location: Right Arm)   Pulse (!) 133   Temp 98.3 F (36.8 C) (Oral)   Resp 17   SpO2 96%  Gen:   Awake, no distress   Resp:  Normal effort  MSK:   Moves extremities without difficulty  Other:    Medical Decision Making  Medically screening exam initiated at 2:51 PM.  Appropriate orders placed.  Rose Bridges was informed that the remainder of the evaluation will be completed by another provider, this initial triage assessment does not replace that evaluation, and the importance of remaining in the ED until their evaluation is complete.     Rose Bridges, [place] 06/01/22 1459

## 2022-06-01 NOTE — ED Notes (Signed)
Shift report received, assumed care at this time.  

## 2022-06-01 NOTE — ED Provider Notes (Signed)
[facility] Provider Note   CSN: [id number] Arrival date & time: 06/01/22  1446     History {Add pertinent medical, surgical, social history, OB history to HPI:1} Chief Complaint  Patient presents with   Abdominal Pain    Rose Bridges is a 62 y.o. female.   Abdominal Pain   62 year old female presents emergency department with complaints of abdominal pain, constipation.  Patient reports being constipated for the past 3 days despite using at home MiraLAX due to chronic use of oxycodone.  Reports decreased p.o. intake due to feelings of nausea but denies vomiting, fever.  Upon initial presentation, patient had had successful bowel movement and states that pain has significantly improved.  Currently complaining of dysuria is been present over the past day or 2.  Denies chest pain, shortness of breath, vaginal symptoms.  Past medical history significant for hyperlipidemia, hypertension, seizure, alcohol dependence, pancreatitis  Home Medications Prior to Admission medications   Medication Sig Start Date End Date Taking? Authorizing Provider  acetaminophen (TYLENOL) 500 MG tablet Take 1,000 mg by mouth 3 (three) times daily. 04/10/22   [provider]  Baclofen 5 MG TABS Take 1 tablet by mouth every 8 (eight) hours as needed for muscle spasms. 04/07/22   Salvadore Oxford, MD  calcitonin, salmon, (MIACALCIN/FORTICAL) 200 UNIT/ACT nasal spray Place 1 spray into alternate nostrils daily. 04/08/22   Salvadore Oxford, MD  calcium carbonate (TUMS - DOSED IN MG ELEMENTAL CALCIUM) 500 MG chewable tablet Chew 1 tablet by mouth 2 (two) times daily.    [provider]  folic acid (FOLVITE) 1 MG tablet Take 1 tablet (1 mg total) by mouth daily. 09/09/19   Elsie Stain, MD  hydrOXYzine (ATARAX) 10 MG tablet Take 1 tablet (10 mg total) by mouth 3 (three) times daily as needed for anxiety. 05/25/22   Dessa Phi, DO  levETIRAcetam  (KEPPRA) 500 MG tablet Take 1 tablet (500 mg total) by mouth 2 (two) times daily. 05/18/22   Caren Griffins, MD  magnesium 30 MG tablet Take 1 tablet (30 mg total) by mouth 2 (two) times daily. 05/18/22   Caren Griffins, MD  oxyCODONE (ROXICODONE) 5 MG immediate release tablet Take 1 tablet (5 mg total) by mouth every 6 (six) hours. 05/25/22   Dessa Phi, DO  oxyCODONE (ROXICODONE) 5 MG immediate release tablet Take 1 tablet (5 mg total) by mouth every 4 (four) hours as needed for severe pain or breakthrough pain. Can take tablet for breakthrough on top of her scheduled oxycodone 05/25/22   Dessa Phi, DO  thiamine 100 MG tablet Take 1 tablet (100 mg total) by mouth daily. 09/09/19   Elsie Stain, MD  Vitamin D, Ergocalciferol, (DRISDOL) 1.25 MG (50000 UNIT) CAPS capsule Take 1 capsule (50,000 Units total) by mouth every 7 (seven) days. Patient taking differently: Take 50,000 Units by mouth every 7 (seven) days. Thursdays 04/12/22   Salvadore Oxford, MD      Allergies    Codeine    Review of Systems   Review of Systems  Gastrointestinal:  Positive for abdominal pain.  All other systems reviewed and are negative.   Physical Exam Updated Vital Signs BP 130/88   Pulse (!) 126   Temp 98.3 F (36.8 C)   Resp 16   SpO2 96%  Physical Exam Vitals and nursing note reviewed.  Constitutional:      General: She is not in acute distress.    Appearance:  She is well-developed.  HENT:     Head: Normocephalic and atraumatic.  Eyes:     Conjunctiva/sclera: Conjunctivae normal.  Cardiovascular:     Rate and Rhythm: Normal rate and regular rhythm.     Heart sounds: No murmur heard. Pulmonary:     Effort: Pulmonary effort is normal. No respiratory distress.     Breath sounds: Normal breath sounds.  Abdominal:     Palpations: Abdomen is soft.     Tenderness: There is abdominal tenderness. There is no right CVA tenderness or left CVA tenderness.  Musculoskeletal:        General:  No swelling.     Cervical back: Neck supple.  Skin:    General: Skin is warm and dry.     Capillary Refill: Capillary refill takes less than 2 seconds.  Neurological:     Mental Status: She is alert.  Psychiatric:        Mood and Affect: Mood normal.     ED Results / Procedures / Treatments   Labs (all labs ordered are listed, but only abnormal results are displayed) Labs Reviewed  COMPREHENSIVE METABOLIC PANEL - Abnormal; Notable for the following components:      Result Value   Glucose, Bld 132 (*)    Alkaline Phosphatase 143 (*)    All other components within normal limits  CBC - Abnormal; Notable for the following components:   WBC 12.8 (*)    Hemoglobin 15.6 (*)    HCT 47.4 (*)    All other components within normal limits  LIPASE, BLOOD    EKG None  Radiology No results found.  Procedures Procedures  {Document cardiac monitor, telemetry assessment procedure when appropriate:1}  Medications Ordered in ED Medications  sodium chloride 0.9 % bolus 1,000 mL (has no administration in time range)    ED Course/ Medical Decision Making/ A&P Clinical Course as of 06/01/22 1852  Fri Jun 01, 2022  1758 Pulse Rate(!): 126 Will trial bolus of fluids given patient's tachycardia.  To reassess once fluids have been given. [CR]  1324 Reevaluation the patient showed improvement of patient's abdominal pain.  Patient's heart rate now down to 92 with about half a liter of saline.  Awaiting UA. [CR]    Clinical Course User Index [CR] Wilnette Kales, PA   {   Click here for ABCD2, HEART and other calculatorsREFRESH Note before signing :1}                          Medical Decision Making Amount and/or Complexity of Data Reviewed Labs: ordered.   This patient presents to the ED for concern of constipation/abdominal pain, this involves an extensive number of treatment options, and is a complaint that carries with it a high risk of complications and morbidity.  The  differential diagnosis includes SBO/LBO, constipation, fecal impaction, urinary tract infection, pyelonephritis, nephrolithiasis, sepsis, gastritis, mesenteric ischemia, CBD pathology, cholecystitis, ovarian torsion ovary dissection, aortic aneurysm   Co morbidities that complicate the patient evaluation  See HPI   Additional history obtained:  Additional history obtained from EMR External records from outside source obtained and reviewed including hospital records   Lab Tests:  I Ordered, and personally interpreted labs.  The pertinent results include: Leukocytosis of 12.8.  No evidence of anemia.  Platelets within range.  No electrolyte abnormalities appreciated.  Isolated elevation of alkaline phosphatase outpatient to be patient's baseline.  No transaminitis.  No renal dysfunction.  Lipase  within normal limits.  UA***   Imaging Studies ordered:  N/a   Cardiac Monitoring: / EKG:  The patient was maintained on a cardiac monitor.  I personally viewed and interpreted the cardiac monitored which showed an underlying rhythm of: Sinus rhythm   Consultations Obtained:  I requested consultation with the ***,  and discussed lab and imaging findings as well as pertinent plan - they recommend: ***   Problem List / ED Course / Critical interventions / Medication management  Constipation I ordered medication including 1 L normal saline   Reevaluation of the patient after these medicines showed that the patient improved I have reviewed the patients home medicines and have made adjustments as needed   Social Determinants of Health:  Chronic cigarette use.  Denies illicit drug use   Test / Admission - Considered:  Constipation Vitals signs significant for ***. Otherwise within normal range and stable throughout visit. Laboratory/imaging studies significant for: *** *** Worrisome signs and symptoms were discussed with the patient, and the patient acknowledged understanding to  return to the ED if noticed. Patient was stable upon discharge.    {Document critical care time when appropriate:1} {Document review of labs and clinical decision tools ie heart score, Chads2Vasc2 etc:1}  {Document your independent review of radiology images, and any outside records:1} {Document your discussion with family members, caretakers, and with consultants:1} {Document social determinants of health affecting pt's care:1} {Document your decision making why or why not admission, treatments were needed:1} Final Clinical Impression(s) / ED Diagnoses Final diagnoses:  None    Rx / DC Orders ED Discharge Orders     None

## 2022-06-28 ENCOUNTER — Encounter (HOSPITAL_COMMUNITY): Payer: Self-pay | Admitting: Gastroenterology

## 2022-06-29 NOTE — Progress Notes (Signed)
Attempted to obtain medical history via telephone, unable to reach at this time. HIPAA compliant voicemail message left requesting return call to pre surgical testing department. 

## 2022-07-05 ENCOUNTER — Ambulatory Visit (HOSPITAL_COMMUNITY)
Admission: RE | Admit: 2022-07-05 | Discharge: 2022-07-05 | Disposition: A | Discharge status: Skilled Nursing Facility | Payer: Medicare Other | Attending: Gastroenterology | Admitting: Gastroenterology

## 2022-07-05 ENCOUNTER — Encounter (HOSPITAL_COMMUNITY)
Admission: RE | Disposition: A | Discharge status: Skilled Nursing Facility | Payer: Self-pay | Source: Home / Self Care | Attending: Gastroenterology

## 2022-07-05 ENCOUNTER — Other Ambulatory Visit: Payer: Self-pay

## 2022-07-05 ENCOUNTER — Ambulatory Visit (HOSPITAL_BASED_OUTPATIENT_CLINIC_OR_DEPARTMENT_OTHER): Payer: Medicare Other | Admitting: Certified Registered"

## 2022-07-05 ENCOUNTER — Ambulatory Visit (HOSPITAL_COMMUNITY): Payer: Medicare Other | Admitting: Certified Registered"

## 2022-07-05 ENCOUNTER — Encounter (HOSPITAL_COMMUNITY): Payer: Self-pay | Admitting: Gastroenterology

## 2022-07-05 DIAGNOSIS — K31A0 Gastric intestinal metaplasia, unspecified: Secondary | ICD-10-CM | POA: Diagnosis not present

## 2022-07-05 DIAGNOSIS — K869 Disease of pancreas, unspecified: Secondary | ICD-10-CM

## 2022-07-05 DIAGNOSIS — K838 Other specified diseases of biliary tract: Secondary | ICD-10-CM

## 2022-07-05 DIAGNOSIS — K2980 Duodenitis without bleeding: Secondary | ICD-10-CM

## 2022-07-05 DIAGNOSIS — I10 Essential (primary) hypertension: Secondary | ICD-10-CM | POA: Insufficient documentation

## 2022-07-05 DIAGNOSIS — F17210 Nicotine dependence, cigarettes, uncomplicated: Secondary | ICD-10-CM

## 2022-07-05 DIAGNOSIS — K5710 Diverticulosis of small intestine without perforation or abscess without bleeding: Secondary | ICD-10-CM

## 2022-07-05 DIAGNOSIS — Z8249 Family history of ischemic heart disease and other diseases of the circulatory system: Secondary | ICD-10-CM | POA: Diagnosis not present

## 2022-07-05 DIAGNOSIS — K2990 Gastroduodenitis, unspecified, without bleeding: Secondary | ICD-10-CM

## 2022-07-05 DIAGNOSIS — K2970 Gastritis, unspecified, without bleeding: Secondary | ICD-10-CM

## 2022-07-05 DIAGNOSIS — K449 Diaphragmatic hernia without obstruction or gangrene: Secondary | ICD-10-CM

## 2022-07-05 DIAGNOSIS — I899 Noninfective disorder of lymphatic vessels and lymph nodes, unspecified: Secondary | ICD-10-CM

## 2022-07-05 DIAGNOSIS — K859 Acute pancreatitis without necrosis or infection, unspecified: Secondary | ICD-10-CM

## 2022-07-05 DIAGNOSIS — K269 Duodenal ulcer, unspecified as acute or chronic, without hemorrhage or perforation: Secondary | ICD-10-CM | POA: Insufficient documentation

## 2022-07-05 DIAGNOSIS — Z5941 Food insecurity: Secondary | ICD-10-CM | POA: Diagnosis not present

## 2022-07-05 DIAGNOSIS — K839 Disease of biliary tract, unspecified: Secondary | ICD-10-CM

## 2022-07-05 HISTORY — DX: Cerebral infarction, unspecified: I63.9

## 2022-07-05 HISTORY — PX: ESOPHAGOGASTRODUODENOSCOPY: SHX5428

## 2022-07-05 HISTORY — PX: BIOPSY: SHX5522

## 2022-07-05 HISTORY — DX: Unspecified osteoarthritis, unspecified site: M19.90

## 2022-07-05 HISTORY — DX: Bipolar disorder, unspecified: F31.9

## 2022-07-05 HISTORY — PX: EUS: SHX5427

## 2022-07-05 SURGERY — UPPER ENDOSCOPIC ULTRASOUND (EUS) LINEAR
Anesthesia: Monitor Anesthesia Care

## 2022-07-05 MED ORDER — PROPOFOL 500 MG/50ML IV EMUL
INTRAVENOUS | Status: DC | PRN
  Administered 2022-07-05: 80 ug/kg/min via INTRAVENOUS

## 2022-07-05 MED ORDER — GLYCOPYRROLATE 0.2 MG/ML IJ SOLN
INTRAMUSCULAR | Status: DC | PRN
  Administered 2022-07-05: .2 mg via INTRAVENOUS

## 2022-07-05 MED ORDER — PROPOFOL 10 MG/ML IV BOLUS
INTRAVENOUS | Status: DC | PRN
  Administered 2022-07-05: 10 mg via INTRAVENOUS
  Administered 2022-07-05: 20 mg via INTRAVENOUS
  Administered 2022-07-05: 50 mg via INTRAVENOUS
  Administered 2022-07-05: 20 mg via INTRAVENOUS

## 2022-07-05 MED ORDER — PANTOPRAZOLE SODIUM 40 MG PO TBEC
40.0000 mg | DELAYED_RELEASE_TABLET | Freq: Two times a day (BID) | ORAL | 11 refills | Status: DC
  Filled 2022-07-05: qty 60, 30d supply, fill #0

## 2022-07-05 MED ORDER — SODIUM CHLORIDE 0.9 % IV SOLN: INTRAVENOUS | Status: DC

## 2022-07-05 MED ORDER — LIDOCAINE 2% (20 MG/ML) 5 ML SYRINGE
INTRAMUSCULAR | Status: DC | PRN
  Administered 2022-07-05: 60 mg via INTRAVENOUS

## 2022-07-05 MED ORDER — ESMOLOL HCL 100 MG/10ML IV SOLN
INTRAVENOUS | Status: DC | PRN
  Administered 2022-07-05: 40 mg via INTRAVENOUS

## 2022-07-05 MED ORDER — LACTATED RINGERS IV SOLN: INTRAVENOUS | Status: DC

## 2022-07-05 NOTE — Transfer of Care (Signed)
Immediate Anesthesia Transfer of Care Note  Patient: Rose Bridges  Procedure(s) Performed: UPPER ENDOSCOPIC ULTRASOUND (EUS) LINEAR ESOPHAGOGASTRODUODENOSCOPY (EGD) BIOPSY  Patient Location: PACU  Anesthesia Type:MAC  Level of Consciousness: awake, alert , oriented, and patient cooperative  Airway & Oxygen Therapy: Patient Spontanous Breathing and Patient connected to face mask oxygen  Post-op Assessment: Report given to RN and Post -op Vital signs reviewed and stable  Post vital signs: Reviewed and stable  Last Vitals:  Vitals Value Taken Time  BP 110/65 07/05/22 1050  Temp    Pulse 80 07/05/22 1050  Resp 18 07/05/22 1050  SpO2 100 % 07/05/22 1050    Last Pain:  Vitals:   07/05/22 0853  TempSrc: Temporal  PainSc: 0-No pain         Complications: No notable events documented.

## 2022-07-05 NOTE — Op Note (Signed)
[facility] Patient Name: Rose Bridges Procedure Date: 07/05/2022 MRN: [id number] Attending MD: Justice Britain , MD, [id number] Date of Birth: Jan 12, 1961 CSN: [id number] Age: 62 Admit Type: Outpatient Procedure:                Upper EUS Indications:              Common bile duct dilation (etiology unknown) seen                            on CT scan, Common bile duct dilation (acquired)                            seen on MRCP, Acute pancreatitis, Epigastric                            abdominal pain Providers:                Justice Britain, MD, Burtis Junes, RN, Tyrone Apple, Technician, Cleda Daub, CRNA Referring MD:             Carlota Raspberry. Havery Moros, MD, Cammie Fulp Medicines:                Monitored Anesthesia Care Complications:            No immediate complications. Estimated Blood Loss:     Estimated blood loss was minimal. Procedure:                Pre-Anesthesia Assessment:                           - Prior to the procedure, a History and Physical                            was performed, and patient medications and                            allergies were reviewed. The patient's tolerance of                            previous anesthesia was also reviewed. The risks                            and benefits of the procedure and the sedation                            options and risks were discussed with the patient.                            All questions were answered, and informed consent                            was obtained. Prior Anticoagulants: The patient has  taken no anticoagulant or antiplatelet agents                            except for aspirin. ASA Grade Assessment: III - A                            patient with severe systemic disease. After                            reviewing the risks and benefits, the patient was                            deemed in satisfactory condition  to undergo the                            procedure.                           After obtaining informed consent, the endoscope was                            passed under direct vision. Throughout the                            procedure, the patient's blood pressure, pulse, and                            oxygen saturations were monitored continuously. The                            [id number] Olympus endoscope was introduced                            through the mouth, and advanced to the second part                            of duodenum. The [id number] Olympus                            duodenoscope was introduced through the mouth, and                            advanced to the area of papilla. The [id number]                            [id number]) Olympus linear ultrasound scope was                            introduced through the mouth, and advanced to the                            duodenum for ultrasound examination from the  stomach and duodenum. Scope In: Scope Out: Findings:      ENDOSCOPIC FINDING: :      No gross lesions were noted in the entire esophagus.      The Z-line was regular and was found 35 cm from the incisors.      A 1 cm hiatal hernia was present.      Patchy moderate inflammation characterized by erosions, erythema and       granularity was found in the entire examined stomach. Biopsies were       taken with a cold forceps for histology and Helicobacter pylori testing.      Patchy moderate inflammation characterized by congestion (edema),       erosions, erythema, granularity and shallow ulcerations was found in the       duodenal bulb, in the duodenal sweep and in the second portion of the       duodenum. Biopsies were taken with a cold forceps for histology and       Helicobacter pylori testing.      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      The major papilla was hidden under a hood but otherwise normal in       appearance.      ENDOSONOGRAPHIC FINDING: :      There was dilation in the common bile duct (4.0 mm -> 10.8 mm) and in       the common hepatic duct (9.7 mm). There was no evidence of any       choledocholithiasis or micro choledocholithiasis in the biliary duct. It       tapers smoothly to the ampulla.      A small amount of hyperechoic material consistent with sludge was       visualized endosonographically in the gallbladder but no overt evidence       of microlithiasis.      Pancreatic parenchymal abnormalities were noted in the pancreatic head       (PD = 3.1 mm), genu of the pancreas (PD = 2.3 mm), pancreatic body (PD =       1.7 mm) and pancreatic tail (PD = 0.7 mm). These consisted of diffusely       increased echogenicity. No evidence of any masses or lesions were found.      Endosonographic imaging of the ampulla showed no intramural       (subepithelial) lesion.      Endosonographic imaging in the visualized portion of the liver showed no       mass.      No malignant-appearing lymph nodes were visualized in the celiac region       (level 20), peripancreatic region and porta hepatis region.      The celiac region was visualized. Impression:               EGD impression:                           - No gross lesions in the entire esophagus. Z-line                            regular, 35 cm from the incisors.                           -  1 cm hiatal hernia.                           - Gastritis. Biopsied.                           - Duodenitis and duodenal ulcers noted. Biopsied.                           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                           - Normal major papilla that was hidden under a hood                            (did not unfortunately see any bile pancreatic                            juices  drain however).                           EUS impression:                           - There was dilation in the common bile duct and in                            the common hepatic duct. There was no evidence of                            any retained choledocholithiasis/micro                            choledocholithiasis. Smooth tapering noted of the                            bile duct to the ampulla.                           -Minimal amount of hyperechoic material consistent                            with sludge was visualized endosonographically in                            the gallbladder. No overt cholelithiasis                           - Pancreatic parenchymal abnormalities consisting                            of diffusely increased echogenicity were noted in  the pancreatic head, genu of the pancreas,                            pancreatic body and pancreatic tail. The pancreatic                            tail was otherwise normal.                           - No ampullary intramural lesions noted.                           - No malignant-appearing lymph nodes were                            visualized in the celiac region (level 20),                            peripancreatic region and porta hepatis region.                           - I suspect that the patient's biliary ductal                            dilation is likely a result of the more proximal                            duodenal diverticulum and the way that it interacts                            in the region of the pancreatic head. This will                            need continued monitoring. Moderate Sedation:      Not Applicable - Patient had care per Anesthesia. Recommendation:           - The patient will be observed post-procedure,                            until all discharge criteria are met.                           - Discharge patient to a nursing home.                            - Patient has a contact number available for                            emergencies. The signs and symptoms of potential                            delayed complications were discussed with the  patient. Return to normal activities tomorrow.                            Written discharge instructions were provided to the                            patient.                           - Resume previous diet.                           - Observe patient's clinical course.                           - Await path results.                           - Recommend initiation of Protonix 40 mg twice                            daily to aid in the healing of the moderate                            gastritis as well as the duodenitis and duodenal                            ulcers. Continue this for at least 2 to 3 months                            daily.                           - Etiology of patient's pancreatitis is unclear                            though at 1 point was felt to potentially be a                            result of a drug-induced reaction. Would recommend                            that primary gastroenterologist review medications                            further to ensure nothing else could be a source.                            If autoimmune IgG4 labs have not been obtained                            previously then consider obtaining those as                            outpatient.                           -  There is mild to minimal amount of sludge within                            the gallbladder, if patient continues to have                            issues, query the potential role of cholecystectomy                            in patients who have idiopathic pancreatitis.                           - Suspect that the duodenal diverticulum is the                            etiology for the patient's biliary dilation. Would                             recommend that patient undergo repeat MRI/MRCP in 6                            to 12 months with primary gastroenterologist for                            further follow-up of this.                           - We will arrange the follow-up in clinic based on                            pathology.                           - The findings and recommendations were discussed                            with the patient. Procedure Code(s):        --- Professional ---                           [phone], Esophagogastroduodenoscopy, flexible,                            transoral; with endoscopic ultrasound examination                            limited to the esophagus, stomach or duodenum, and                            adjacent structures                           43239, Esophagogastroduodenoscopy, flexible,                            transoral;  with biopsy, single or multiple Diagnosis Code(s):        --- Professional ---                           K44.9, Diaphragmatic hernia without obstruction or                            gangrene                           K29.70, Gastritis, unspecified, without bleeding                           K29.80, Duodenitis without bleeding                           K83.8, Other specified diseases of biliary tract                           K86.9, Disease of pancreas, unspecified                           I89.9, Noninfective disorder of lymphatic vessels                            and lymph nodes, unspecified                           K85.90, Acute pancreatitis without necrosis or                            infection, unspecified                           R10.13, Epigastric pain                           K57.10, Diverticulosis of small intestine without                            perforation or abscess without bleeding                           R93.2, Abnormal findings on diagnostic imaging of                            liver and biliary tract CPT copyright 2022  [employer]. All rights reserved. The codes documented in this report are preliminary and upon coder review may  be revised to meet current compliance requirements. Justice Britain, MD 07/05/2022 11:04:16 AM Number of Addenda: 0

## 2022-07-05 NOTE — H&P (Addendum)
GASTROENTEROLOGY PROCEDURE H&P NOTE   Primary Care Physician: Antony Blackbird, MD  HPI: Rose Bridges is a 62 y.o. female who presents for EGD/EUS to evaluate a dilated bile duct with gallbladder still in place, ectatic pancreatic duct, duodenal diverticulum, recent bouts of pancreatitis (rule out chronic pancreatitis).  Past Medical History:  Diagnosis Date   Hyperlipemia    Hypertension    Osteogenesis imperfecta    Osteoporosis    Past Surgical History:  Procedure Laterality Date   ABDOMINAL HYSTERECTOMY     HIP ARTHROPLASTY Left 09/26/2015   Procedure: ARTHROPLASTY BIPOLAR HIP (HEMIARTHROPLASTY);  Surgeon: Renette Butters, MD;  Location: [facility];  Service: Orthopedics;  Laterality: Left;   ORIF HUMERUS FRACTURE Right 11/06/2016   Procedure: OPEN REDUCTION INTERNAL FIXATION (ORIF) PROXIMAL HUMERUS FRACTURE;  Surgeon: Renette Butters, MD;  Location: [facility];  Service: Orthopedics;  Laterality: Right;   No current facility-administered medications for this encounter.   No current facility-administered medications for this encounter. Allergies  Allergen Reactions   Codeine Other (See Comments)    Migraine    Family History  Problem Relation Age of Onset   Stroke Mother    Heart attack Mother    Hypertension Mother    Stroke Sister    Stroke Brother    Lung cancer Father    Social History   Socioeconomic History   Marital status: Divorced    Spouse name: Not on file   Number of children: 1   Years of education: College   Highest education level: Not on file  Occupational History   Occupation: Sales promotion account executive  Tobacco Use   Smoking status: Every Day    Packs/day: 0.25    Types: Cigarettes   Smokeless tobacco: Never  Vaping Use   Vaping Use: Never used  Substance and Sexual Activity   Alcohol use: No    Alcohol/week: 85.0 standard drinks of alcohol    Types: 85 Shots of liquor per week    Comment: No alcohol use since 11/04/16.   Drug use: No   Sexual  activity: Not Currently  Other Topics Concern   Not on file  Social History Narrative   Pt lives with her mother.   Right-handed.   1-2 bottles (16Oz each) of [facility] or Dr. Malachi Bonds.   Occasionally drinks [employer] drinks.   Social Determinants of Health   Financial Resource Strain: Not on file  Food Insecurity: Food Insecurity Present (05/22/2022)   Hunger Vital Sign    Worried About Running Out of Food in the Last Year: Sometimes true    Ran Out of Food in the Last Year: Sometimes true  Transportation Needs: No Transportation Needs (05/22/2022)   PRAPARE - Hydrologist (Medical): No    Lack of Transportation (Non-Medical): No  Recent Concern: Transportation Needs - Unmet Transportation Needs (05/10/2022)   PRAPARE - Hydrologist (Medical): Yes    Lack of Transportation (Non-Medical): Yes  Physical Activity: Not on file  Stress: Not on file  Social Connections: Not on file  Intimate Partner Violence: Not At Risk (05/22/2022)   Humiliation, Afraid, Rape, and Kick questionnaire    Fear of Current or Ex-Partner: No    Emotionally Abused: No    Physically Abused: No    Sexually Abused: No    Physical Exam: There were no vitals filed for this visit. There is no height or weight on file to calculate BMI. GEN: NAD EYE:  Sclerae anicteric ENT: MMM CV: Non-tachycardic GI: Soft,TTP in MEG NEURO:  Alert & Oriented x 3  Lab Results: No results for input(s): "WBC", "HGB", "HCT", "PLT" in the last 72 hours. BMET No results for input(s): "NA", "K", "CL", "CO2", "GLUCOSE", "BUN", "CREATININE", "CALCIUM" in the last 72 hours. LFT No results for input(s): "PROT", "ALBUMIN", "AST", "ALT", "ALKPHOS", "BILITOT", "BILIDIR", "IBILI" in the last 72 hours. PT/INR No results for input(s): "LABPROT", "INR" in the last 72 hours.   Impression / Plan: This is a 62 y.o.female who presents for EGD/EUS to evaluate a dilated bile duct with  gallbladder still in place, ectatic pancreatic duct, duodenal diverticulum, recent bouts of pancreatitis (rule out chronic pancreatitis).  The risks of an EUS including intestinal perforation, bleeding, infection, aspiration, and medication effects were discussed as was the possibility it may not give a definitive diagnosis if a biopsy is performed.  When a biopsy of the pancreas is done as part of the EUS, there is an additional risk of pancreatitis at the rate of about 1-2%.  It was explained that procedure related pancreatitis is typically mild, although it can be severe and even life threatening, which is why we do not perform random pancreatic biopsies and only biopsy a lesion/area we feel is concerning enough to warrant the risk.   The risks and benefits of endoscopic evaluation/treatment were discussed with the patient and/or family; these include but are not limited to the risk of perforation, infection, bleeding, missed lesions, lack of diagnosis, severe illness requiring hospitalization, as well as anesthesia and sedation related illnesses.  The patient's history has been reviewed, patient examined, no change in status, and deemed stable for procedure.  The patient and/or family is agreeable to proceed.    Justice Britain, MD [facility] Office # [phone]

## 2022-07-05 NOTE — Discharge Instructions (Signed)
YOU HAD AN ENDOSCOPIC PROCEDURE TODAY: Refer to the procedure report and other information in the discharge instructions given to you for any specific questions about what was found during the examination. If this information does not answer your questions, please call [facility] office at [phone] to clarify.   YOU SHOULD EXPECT: Some feelings of bloating in the abdomen. Passage of more gas than usual. Walking can help get rid of the air that was put into your GI tract during the procedure and reduce the bloating. If you had a lower endoscopy (such as a colonoscopy or flexible sigmoidoscopy) you may notice spotting of blood in your stool or on the toilet paper. Some abdominal soreness may be present for a day or two, also.  DIET: Your first meal following the procedure should be a light meal and then it is ok to progress to your normal diet. A half-sandwich or bowl of soup is an example of a good first meal. Heavy or fried foods are harder to digest and may make you feel nauseous or bloated. Drink plenty of fluids but you should avoid alcoholic beverages for 24 hours. If you had a esophageal dilation, please see attached instructions for diet.    ACTIVITY: Your care partner should take you home directly after the procedure. You should plan to take it easy, moving slowly for the rest of the day. You can resume normal activity the day after the procedure however YOU SHOULD NOT DRIVE, use power tools, machinery or perform tasks that involve climbing or major physical exertion for 24 hours (because of the sedation medicines used during the test).   SYMPTOMS TO REPORT IMMEDIATELY: A gastroenterologist can be reached at any hour. Please call [phone]  for any of the following symptoms:   Following upper endoscopy (EGD, EUS, ERCP, esophageal dilation) Vomiting of blood or coffee ground material  New, significant abdominal pain  New, significant chest pain or pain under the shoulder blades  Painful or  persistently difficult swallowing  New shortness of breath  Black, tarry-looking or red, bloody stools  FOLLOW UP:  If any biopsies were taken you will be contacted by phone or by letter within the next 1-3 weeks. Call [phone]  if you have not heard about the biopsies in 3 weeks.  Please also call with any specific questions about appointments or follow up tests.  

## 2022-07-05 NOTE — Anesthesia Postprocedure Evaluation (Signed)
Anesthesia Post Note  Patient: Rose Bridges  Procedure(s) Performed: UPPER ENDOSCOPIC ULTRASOUND (EUS) LINEAR ESOPHAGOGASTRODUODENOSCOPY (EGD) BIOPSY     Patient location during evaluation: PACU Anesthesia Type: MAC Level of consciousness: awake and alert Pain management: pain level controlled Vital Signs Assessment: post-procedure vital signs reviewed and stable Respiratory status: spontaneous breathing, nonlabored ventilation, respiratory function stable and patient connected to nasal cannula oxygen Cardiovascular status: stable and blood pressure returned to baseline Postop Assessment: no apparent nausea or vomiting Anesthetic complications: no  No notable events documented.  Last Vitals:  Vitals:   07/05/22 1100 07/05/22 1110  BP: 124/76 124/74  Pulse: 84 79  Resp: 16 12  Temp:    SpO2: 99% 97%    Last Pain:  Vitals:   07/05/22 1110  TempSrc:   PainSc: 5                  Anysia Choi S

## 2022-07-05 NOTE — Anesthesia Preprocedure Evaluation (Signed)
Anesthesia Evaluation  Patient identified by MRN, date of birth, ID band Patient awake    Reviewed: Allergy & Precautions, H&P , NPO status , Patient's Chart, lab work & pertinent test results  Airway Mallampati: II  TM Distance: >3 FB Neck ROM: Full    Dental no notable dental hx.    Pulmonary Current Smoker   Pulmonary exam normal breath sounds clear to auscultation       Cardiovascular hypertension, Normal cardiovascular exam Rhythm:Regular Rate:Normal     Neuro/Psych Seizures -, Well Controlled,   negative psych ROS   GI/Hepatic negative GI ROS,,,(+)     substance abuse  alcohol use  Endo/Other  negative endocrine ROS    Renal/GU negative Renal ROS  negative genitourinary   Musculoskeletal OI   Abdominal   Peds negative pediatric ROS (+)  Hematology negative hematology ROS (+)   Anesthesia Other Findings   Reproductive/Obstetrics negative OB ROS                             Anesthesia Physical Anesthesia Plan  ASA: 3  Anesthesia Plan: MAC   Post-op Pain Management: Minimal or no pain anticipated   Induction: Intravenous  PONV Risk Score and Plan: 2 and Propofol infusion and Treatment may vary due to age or medical condition  Airway Management Planned: Simple Face Mask  Additional Equipment:   Intra-op Plan:   Post-operative Plan:   Informed Consent: I have reviewed the patients History and Physical, chart, labs and discussed the procedure including the risks, benefits and alternatives for the proposed anesthesia with the patient or authorized representative who has indicated his/her understanding and acceptance.     Dental advisory given  Plan Discussed with: CRNA and Surgeon  Anesthesia Plan Comments:        Anesthesia Quick Evaluation

## 2022-07-06 LAB — SURGICAL PATHOLOGY

## 2022-07-07 ENCOUNTER — Encounter: Payer: Self-pay | Admitting: Gastroenterology

## 2022-07-08 ENCOUNTER — Encounter (HOSPITAL_COMMUNITY): Payer: Self-pay | Admitting: Gastroenterology

## 2022-07-12 ENCOUNTER — Telehealth: Payer: Self-pay

## 2022-07-12 ENCOUNTER — Other Ambulatory Visit: Payer: Self-pay

## 2022-07-12 NOTE — Telephone Encounter (Signed)
Patient has been scheduled for a f/u appt with Dr. Havery Moros on 10/11/22 at 9:50 am. Appt information mailed and sent to patient via [facility].

## 2022-07-12 NOTE — Telephone Encounter (Signed)
-----   Message from Yetta Flock, MD sent at 07/10/2022 12:58 PM EDT ----- Rose Bridges can you help coordinate routine office follow-up?  Thanks ----- Message ----- From: Timothy Lasso, RN Sent: 07/09/2022   8:21 AM EDT To: Yetta Flock, MD

## 2022-10-11 ENCOUNTER — Ambulatory Visit: Payer: Medicare Other | Admitting: Gastroenterology

## 2022-10-11 NOTE — Progress Notes (Deleted)
HPI :  Seen in January when admitted with pancreatitis  62 year old female with medical history as outlined, admitted in December with pelvic fracture, discharged to SNF.  She was recovering there, developed constipation, had been eating too well.  Presented to the ED on January 11 with confusion, agitation, found to have UTI and metabolic encephalopathy.  Given ceftriaxone for the UTI.   On January 13 developed severe pain and vomiting.  CT scan shows acute pancreatitis with biliary ductal dilation.  Lipase initially over 2000, Alk phos 144, ALT 37, AST 43 today.  Previously alk phos 160 AST 84, T. bili has been normal.  Triglycerides done and normal.  She does not drink alcohol.  Of note on CT scan she has a very large proximal duodenal diverticulum, roughly 6 cm.   She had an MRCP done today to rule out biliary obstruction.  She has a dilated common bile duct upwards of 15 mm with some moderate central intrahepatic ductal dilation.  There is no obvious filling defect noted but there is an abrupt transition at the ampulla with a mild to moderately dilated pancreatic duct without stricture.  There is no overt pancreatic mass, ampullary lesion cannot be excluded.   She is hemodynamically stable at this time, vitals stable, she is actually asking to eat, she has a good appetite.  Her renal function is normal, her white blood cell count is normal.  This is reassuring.   I discussed pancreatitis with her and differential diagnosis.  She recently was given ceftriaxone which is considered a class III medication for potential to cause pancreatitis, so drug-induced pancreatitis is possible.  None of her chronic medication she takes as outpatient would likely do this.  Otherwise, her biliary ductal dilation raised question of biliary obstruction.  There is certainly no choledocholithiasis or obvious mass lesion based on MRCP, although ampullary lesion is possible.  While uncommon, also possible large duodenal  diverticulum could be related to a biliary obstruction causing pancreatitis.  I have reached out to my advanced endoscopy colleagues to consider role of EUS at some point given this findings.  Until then would proceed with supportive care, hydration, clear liquid diet for now.  She seems to be improving and again her labs are reassuring.   I will touch base with primary team, but hold ceftriaxone given this occurrence and treat UTI with a different antibiotic in case the ceftriaxone was related.  Lengthy discussion with the patient who understands, all questions answered.    Acute pancreatitis Dilated bile duct /abnormal biliary imaging Recent UTI treated with Rocephin, stopped on 1/15 Recent pelvic fracture nonoperative management - osteogenesis imperfecta   Labs are stable.  She is improved from when this first started but still having some intermittent pain although she appears much more comfortable on interview today and her mood appears better.  I have reviewed her imaging studies with her and advanced endoscopy, Dr. Meridee Score.  Unclear what has driven her pancreatitis at this point.  She has biliary ductal dilation on imaging but unclear chronicity of this and if related to her pancreatitis or not.  She only had very mild elevation in her liver enzymes.  She has a very large duodenal diverticulum which can rarely be associated with pancreatitis however in her case perhaps this appears a bit further distal in her bowel to be related to this.  Another potential etiology would be ceftriaxone which has been reported to cause pancreatitis, she is no longer on this.  We discussed again that she warrants an EUS to further evaluate her biliary tree and rule out an ampullary lesion, the question is the timing of this.  Ideally we would want more time for her pancreatitis to improve and do this as an outpatient in a few weeks.  She understands this and I do think she is slowly improving.  She is hungry, we  will give her a low-fat diet, she will go slow with this and see how she does. If she fails to improve to the point where we can discharge in upcoming days, may consider endoscopic evaluation of the ampulla if full EUS not available this week.   Readmitted following discharge in January with recurrent symptoms    CT 05/13/22: IMPRESSION: 1. Acute interstitial proximal pancreatitis without evidence of pancreatic necrosis, hemorrhage or abscess. 2. There is intrahepatic and extrahepatic biliary dilatation, with the common bile duct measuring up to 1.3 cm. No calcified intraductal stone or mass is seen. Etiology could be due to ampullary stricture, spasm or small ampullary mass. 3. Mild gallbladder wall thickening and trace pericholecystic fluid. No calcified gallstone. Findings could be congestive, reactive due to adjacent pancreatitis, or from acalculous cholecystitis. 4. 6.4 x 5 x 4.5 cm duodenal diverticulum at the junction of the descending and horizontal portions. 5. Aortic atherosclerosis. 6. Small right and trace left pleural effusions. 7. Healing bilateral sacral insufficiency fractures and left pubic bone fracture. 8. Osteopenia and degenerative change. 9. Air pockets in the subcutaneous abdominal wall most likely due to recent injections.   MRCP: 05/14/22: IMPRESSION: Dilated common bile duct up to 15 mm with some moderate central intrahepatic duct dilatation. No obvious filling defect but slight abrupt transition at the ampulla. There is also a mild-to-moderately ectatic pancreatic duct without significant stricture. Subtle ampullary lesion is not excluded.   No separate pancreatic mass. There is some mild peripancreatic stranding. Please correlate with the provided clinical history of pancreatitis. No well-defined fluid collections.   Diffuse mild gallbladder wall thickening with focal area with cystic change towards the fundus consistent with a component  of adenomyomatosis.   Tiny pleural effusions.   CT 05/23/22: IMPRESSION: 1. Unchanged intra- and extrahepatic biliary and main pancreatic ductal dilatation. The CBD is dilated to the ampulla as on recent MRI. Superimposed large (8 cm) duodenal diverticulum there, but no active inflammation or other complicating features identified.   2. No pancreatic inflammation by CT. No new abnormality in the abdomen or pelvis.   3. Diffuse osteopenia with stable lumbar compression fractures, sacral fractures.   4.  Aortic Atherosclerosis (ICD10-I70.0).    CT 05/29/22: IMPRESSION: 1. No acute localizing process in the abdomen or pelvis. 2. Stable moderate intra and extrahepatic biliary ductal dilatation. 3. Stable large duodenal diverticula. 4. Large amount of stool throughout the colon.   Aortic Atherosclerosis (ICD10-I70.0).   EUS 07/05/22: EGD impression: - No gross lesions in the entire esophagus. Z-line regular, 35 cm from the incisors. - 1 cm hiatal hernia. - Gastritis. Biopsied. - Duodenitis and duodenal ulcers noted. Biopsied. - Duodenal diverticulum in D2 (just proximal to the presumed major papilla). - Normal major papilla that was hidden under a hood (did not unfortunately see any bile pancreatic juices drain however).  EUS impression: - There was dilation in the common bile duct and in the common hepatic duct. There was no evidence of any retained choledocholithiasis/micro choledocholithiasis. Smooth tapering noted of the bile duct to the ampulla. -Minimal amount of hyperechoic material consistent with sludge was  visualized endosonographically in the gallbladder. No overt cholelithiasis - Pancreatic parenchymal abnormalities consisting of diffusely increased echogenicity were noted in the pancreatic head, genu of the pancreas, pancreatic body and pancreatic tail. The pancreatic tail was otherwise normal. - No ampullary intramural lesions noted. - No malignant-appearing lymph nodes were  visualized in the celiac region (level 20), peripancreatic region and porta hepatis region. - I suspect that the patient's biliary ductal dilation is likely a result of the more proximal duodenal diverticulum and the way that it interacts in the region of the pancreatic head. This will need continued monitoring.  - Recommend initiation of Protonix 40 mg twice daily to aid in the healing of the moderate gastritis as well as the duodenitis and duodenal ulcers. Continue this for at least 2 to 3 months daily. - Etiology of patient's pancreatitis is unclear though at 1 point was felt to potentially be a result of a drug-induced reaction. Would recommend that primary gastroenterologist review medications further to ensure nothing else could be a source. If autoimmune IgG4 labs have not been obtained previously then consider obtaining those as outpatient. - There is mild to minimal amount of sludge within the gallbladder, if patient continues to have issues, query the potential role of cholecystectomy in patients who have idiopathic pancreatitis. - Suspect that the duodenal diverticulum is the etiology for the patient's biliary dilation. Would recommend that patient undergo repeat MRI/MRCP in 6 to 12 months with primary gastroenterologist for further follow-up of this.  FINAL MICROSCOPIC DIAGNOSIS:   A. STOMACH, BIOPSY:  - Gastric antral and oxyntic mucosa with no specific histopathologic  changes  - Helicobacter pylori-like organisms are not identified on routine HE  stain   B. 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,  suggestive of peptic duodenitis  - Negative for increased intraepithelial lymphocytes or villous  architectural changes         Past Medical History:  Diagnosis Date   Arthritis    Bipolar disorder (HCC)    Hyperlipemia    Hypertension    Osteogenesis imperfecta    Osteoporosis    Stroke [facility])      Past Surgical History:   Procedure Laterality Date   ABDOMINAL HYSTERECTOMY     BIOPSY  07/05/2022   Procedure: BIOPSY;  Surgeon: Lemar Lofty., MD;  Location: [facility];  Service: Gastroenterology;;   ESOPHAGOGASTRODUODENOSCOPY N/A 07/05/2022   Procedure: ESOPHAGOGASTRODUODENOSCOPY (EGD);  Surgeon: Lemar Lofty., MD;  Location: [facility];  Service: Gastroenterology;  Laterality: N/A;   EUS N/A 07/05/2022   Procedure: UPPER ENDOSCOPIC ULTRASOUND (EUS) LINEAR;  Surgeon: Lemar Lofty., MD;  Location: [facility];  Service: Gastroenterology;  Laterality: N/A;   HIP ARTHROPLASTY Left 09/26/2015   Procedure: ARTHROPLASTY BIPOLAR HIP (HEMIARTHROPLASTY);  Surgeon: Sheral Apley, MD;  Location: [facility] OR;  Service: Orthopedics;  Laterality: Left;   ORIF HUMERUS FRACTURE Right 11/06/2016   Procedure: OPEN REDUCTION INTERNAL FIXATION (ORIF) PROXIMAL HUMERUS FRACTURE;  Surgeon: Sheral Apley, MD;  Location: [facility] OR;  Service: Orthopedics;  Laterality: Right;   Family History  Problem Relation Age of Onset   Stroke Mother    Heart attack Mother    Hypertension Mother    Stroke Sister    Stroke Brother    Lung cancer Father    Social History   Tobacco Use   Smoking status: Every Day    Packs/day: .25    Types: Cigarettes   Smokeless tobacco: Never  Vaping Use  Vaping Use: Never used  Substance Use Topics   Alcohol use: No    Alcohol/week: 85.0 standard drinks of alcohol    Types: 85 Shots of liquor per week    Comment: No alcohol use since 11/04/16.   Drug use: No   Current Outpatient Medications  Medication Sig Dispense Refill   aspirin 81 MG chewable tablet Chew by mouth daily.     Baclofen 5 MG TABS Take 1 tablet by mouth every 8 (eight) hours as needed for muscle spasms. (Patient not taking: Reported on 07/02/2022) 30 each 0   calcitonin, salmon, (MIACALCIN/FORTICAL) 200 UNIT/ACT nasal spray Place 1 spray into alternate nostrils daily. 3.7 mL 12   calcium carbonate (TUMS - DOSED  IN MG ELEMENTAL CALCIUM) 500 MG chewable tablet Chew 1 tablet by mouth 2 (two) times daily.     docusate sodium (COLACE) 100 MG capsule Take 100 mg by mouth 2 (two) times daily.     FLUoxetine (PROZAC) 10 MG tablet Take 10 mg by mouth daily.     folic acid (FOLVITE) 1 MG tablet Take 1 tablet (1 mg total) by mouth daily. 90 tablet 3   hydrOXYzine (ATARAX) 10 MG tablet Take 1 tablet (10 mg total) by mouth 3 (three) times daily as needed for anxiety. 30 tablet 0   levETIRAcetam (KEPPRA) 500 MG tablet Take 1 tablet (500 mg total) by mouth 2 (two) times daily.     magnesium gluconate (MAGONATE) 500 MG tablet Take 500 mg by mouth 2 (two) times daily.     oxyCODONE (ROXICODONE) 5 MG immediate release tablet Take 1 tablet (5 mg total) by mouth every 6 (six) hours. (Patient taking differently: Take 5 mg by mouth See admin instructions. Every 10 hours) 28 tablet 0   oxyCODONE (ROXICODONE) 5 MG immediate release tablet Take 1 tablet (5 mg total) by mouth every 4 (four) hours as needed for severe pain or breakthrough pain. Can take tablet for breakthrough on top of her scheduled oxycodone (Patient not taking: Reported on 07/02/2022) 30 tablet 0   pantoprazole (PROTONIX) 40 MG tablet Take 1 tablet (40 mg total) by mouth 2 (two) times daily before a meal. 60 tablet 11   polyethylene glycol (MIRALAX / GLYCOLAX) 17 g packet Take 17 g by mouth daily.     senna (SENOKOT) 8.6 MG TABS tablet Take 1 tablet by mouth.     thiamine 100 MG tablet Take 1 tablet (100 mg total) by mouth daily. 90 tablet 2   Vitamin D, Ergocalciferol, (DRISDOL) 1.25 MG (50000 UNIT) CAPS capsule Take 1 capsule (50,000 Units total) by mouth every 7 (seven) days. (Patient taking differently: Take 50,000 Units by mouth every 7 (seven) days. Thursdays) 5 capsule    No current facility-administered medications for this visit.   Allergies  Allergen Reactions   Codeine Other (See Comments)    Migraine      Review of Systems: All systems reviewed  and negative except where noted in HPI.    No results found.  Physical Exam: There were no vitals taken for this visit. Constitutional: Pleasant,well-developed, ***female in no acute distress. HEENT: Normocephalic and atraumatic. Conjunctivae are normal. No scleral icterus. Neck supple.  Cardiovascular: Normal rate, regular rhythm.  Pulmonary/chest: Effort normal and breath sounds normal. No wheezing, rales or rhonchi. Abdominal: Soft, nondistended, nontender. Bowel sounds active throughout. There are no masses palpable. No hepatomegaly. Extremities: no edema Lymphadenopathy: No cervical adenopathy noted. Neurological: Alert and oriented to person place and time. Skin: Skin  is warm and dry. No rashes noted. Psychiatric: Normal mood and affect. Behavior is normal.   ASSESSMENT: 62 y.o. female here for assessment of the following  No diagnosis found.  PLAN:   Cain Saupe, MD

## 2022-10-29 ENCOUNTER — Telehealth: Payer: Self-pay | Admitting: Gastroenterology

## 2022-10-29 NOTE — Telephone Encounter (Signed)
Thanks Dottie. Agree she should have C Diff PCR sent as well as pancreatic fecal elastase initially. If they are able to to send those off from the SNF that would be helpful for her. Thanks

## 2022-10-29 NOTE — Telephone Encounter (Signed)
This is a Dr Adela Lank pt-Dr Mansouraty only saw pt for EUS. Will send to her nurse

## 2022-10-29 NOTE — Telephone Encounter (Signed)
Shanda Bumps from [facility] called to request a letter of some sort because the patient is refusing to eat anything until she see's the GI doctor. The patients states everything they give her to eat is giving her diarrhea. Please advise [phone].

## 2022-10-29 NOTE — Telephone Encounter (Signed)
I have spoken to [place] at [facility] to advise that it would be helpful for patient to have C Diff testing as well as pancreatic fecal elastase prior to her visit with [place]. Shanda Bumps requests that we send her the documentation for this to fax [phone]. Dr Lanetta Inch note sent.

## 2022-10-29 NOTE — Telephone Encounter (Signed)
Left message for Rose Bridges with [facility] to call back with what information they are needing.

## 2022-10-29 NOTE — Telephone Encounter (Signed)
Patient's caregiver, Shanda Bumps at [facility] called seeking advice on what to do for patient as she has been refusing to eat the nursing facility food. She says that their food gives her "diarrhea" and she is not going to eat what they make. Patient is eating peanut butter, ensure and foods her family brings her instead (no c/o diarrhea with these foods).   Shanda Bumps says patient is not having any complaints of abdominal pain, blood in stool, nausea or vomiting associated with the diarrhea.   I suggested that the nursing staff request the nursing facility in home physician to order stool studies for c diff or infectious causes if he/she finds appropriate.   Patient is currently scheduled to see Dr Adela Lank at his next available visit on 01/29/23. (Was previously scheduled 09/2022 but no showed appt due to transportation).  Patient does have hx of pancreatitis for which she was hospitalized 06/2022.  Any additional recommendations?

## 2022-11-13 ENCOUNTER — Other Ambulatory Visit: Payer: Self-pay

## 2022-11-13 ENCOUNTER — Encounter (HOSPITAL_COMMUNITY): Payer: Self-pay | Admitting: Emergency Medicine

## 2022-11-13 ENCOUNTER — Emergency Department (HOSPITAL_COMMUNITY)
Admission: EM | Admit: 2022-11-13 | Discharge: 2022-11-13 | Disposition: A | Discharge status: Home / Self Care | Payer: Medicare Other | Attending: Emergency Medicine | Admitting: Emergency Medicine

## 2022-11-13 ENCOUNTER — Emergency Department (HOSPITAL_COMMUNITY): Payer: Medicare Other

## 2022-11-13 DIAGNOSIS — N3001 Acute cystitis with hematuria: Secondary | ICD-10-CM | POA: Diagnosis not present

## 2022-11-13 DIAGNOSIS — Z7982 Long term (current) use of aspirin: Secondary | ICD-10-CM | POA: Insufficient documentation

## 2022-11-13 DIAGNOSIS — R1084 Generalized abdominal pain: Secondary | ICD-10-CM | POA: Diagnosis present

## 2022-11-13 LAB — URINALYSIS, ROUTINE W REFLEX MICROSCOPIC
Bilirubin Urine: NEGATIVE
Glucose, UA: NEGATIVE mg/dL
Ketones, ur: NEGATIVE mg/dL
Nitrite: NEGATIVE
Protein, ur: NEGATIVE mg/dL
Specific Gravity, Urine: 1.027 (ref 1.005–1.030)
WBC, UA: >50 WBC/hpf (ref 0–5)
pH: 5 (ref 5.0–8.0)

## 2022-11-13 LAB — CBC WITH DIFFERENTIAL/PLATELET
Abs Immature Granulocytes: 0.02 10*3/uL (ref 0.00–0.07)
Basophils Absolute: 0 10*3/uL (ref 0.0–0.1)
Basophils Relative: 1 %
Eosinophils Absolute: 0.2 10*3/uL (ref 0.0–0.5)
Eosinophils Relative: 3 %
HCT: 47.7 % — ABNORMAL HIGH (ref 36.0–46.0)
Hemoglobin: 15 g/dL (ref 12.0–15.0)
Immature Granulocytes: 0 %
Lymphocytes Relative: 39 %
Lymphs Abs: 2.5 10*3/uL (ref 0.7–4.0)
MCH: 30.7 pg (ref 26.0–34.0)
MCHC: 31.4 g/dL (ref 30.0–36.0)
MCV: 97.7 fL (ref 80.0–100.0)
Monocytes Absolute: 0.5 10*3/uL (ref 0.1–1.0)
Monocytes Relative: 8 %
Neutro Abs: 3.1 10*3/uL (ref 1.7–7.7)
Neutrophils Relative %: 49 %
Platelets: 248 10*3/uL (ref 150–400)
RBC: 4.88 MIL/uL (ref 3.87–5.11)
RDW: 12.3 % (ref 11.5–15.5)
WBC: 6.4 10*3/uL (ref 4.0–10.5)
nRBC: 0 % (ref 0.0–0.2)

## 2022-11-13 LAB — COMPREHENSIVE METABOLIC PANEL
ALT: 30 U/L (ref 0–44)
AST: 36 U/L (ref 15–41)
Albumin: 3.5 g/dL (ref 3.5–5.0)
Alkaline Phosphatase: 107 U/L (ref 38–126)
Anion gap: 9 (ref 5–15)
BUN: 11 mg/dL (ref 8–23)
CO2: 25 mmol/L (ref 22–32)
Calcium: 8.9 mg/dL (ref 8.9–10.3)
Chloride: 100 mmol/L (ref 98–111)
Creatinine, Ser: 0.72 mg/dL (ref 0.44–1.00)
GFR, Estimated: >60 mL/min (ref >60)
Glucose, Bld: 104 mg/dL — ABNORMAL HIGH (ref 70–99)
Potassium: 4.2 mmol/L (ref 3.5–5.1)
Sodium: 134 mmol/L — ABNORMAL LOW (ref 135–145)
Total Bilirubin: 0.5 mg/dL (ref 0.3–1.2)
Total Protein: 6.7 g/dL (ref 6.5–8.1)

## 2022-11-13 LAB — LIPASE, BLOOD: Lipase: 28 U/L (ref 11–51)

## 2022-11-13 MED ORDER — SODIUM CHLORIDE 0.9 % IV SOLN
1.0000 g | Freq: Once | INTRAVENOUS | Status: AC
  Administered 2022-11-13: 1 g via INTRAVENOUS
  Filled 2022-11-13: qty 10

## 2022-11-13 MED ORDER — IOHEXOL 350 MG/ML SOLN
75.0000 mL | Freq: Once | INTRAVENOUS | Status: AC | PRN
  Administered 2022-11-13: 75 mL via INTRAVENOUS

## 2022-11-13 MED ORDER — SODIUM CHLORIDE 0.9 % IV BOLUS
1000.0000 mL | Freq: Once | INTRAVENOUS | Status: AC
  Administered 2022-11-13: 1000 mL via INTRAVENOUS

## 2022-11-13 MED ORDER — CEPHALEXIN 500 MG PO CAPS
500.0000 mg | ORAL_CAPSULE | Freq: Four times a day (QID) | ORAL | 0 refills | Status: DC
  Filled 2022-11-13: qty 20, 5d supply, fill #0

## 2022-11-13 NOTE — ED Notes (Signed)
Report given to LNP at [facility]. Pending [name] transport.

## 2022-11-13 NOTE — ED Notes (Signed)
Pt had another episode of diarrhea. Pt cleaned. Linens changed. Pt resting comfortably in bed with call bell in reach.

## 2022-11-13 NOTE — Discharge Instructions (Addendum)
It was a pleasure taking care of you today.  As discussed, your urine showed signs of an infection.  I am sending you home with antibiotics.  Take as prescribed and finish all antibiotics.  Please follow-up with PCP in 2 to 3 days for recheck.  Return to the ER for new or worsening symptoms.

## 2022-11-13 NOTE — ED Notes (Signed)
Attempted to collect urine sample from pt. Pt unable to provide sample. Pt had episode of diarrhea x2. RN cleaned pt. Full linen change. Pt in bed resting comfortably with call bell in reach.

## 2022-11-13 NOTE — ED Triage Notes (Signed)
Per [facility] pt coming from home [facility]- staff reports patient has been refusing to eat meals since she does not want to have diarrhea. States she is only eating peanut butter her family is bringing her. Ems states they were in the parking lot at [facility] and patient became irate and refused to get out of the ambulance so they drove her here.

## 2022-11-13 NOTE — ED Provider Triage Note (Signed)
Emergency Medicine Provider Triage Evaluation Note  Rose Bridges , a 62 y.o. female  was evaluated in triage.  Pt complains of abdominal pain x 5 months. Decreased in oral intake, and does not like the facility.  She also is concerned that the facility is not letting her smoke or tobacco, she reports "I will get this cigarette somehow ".  Sounds like symptoms have been ongoing for some time.  Review of Systems  Positive: Abdominal pain,  Negative: Nausea, vomiting, fever  Physical Exam  BP 111/69 (BP Location: Left Arm)   Pulse 82   Temp 98 F (36.7 C) (Oral)   Resp 18   Ht 5\' 4"  (1.626 m)   Wt 59 kg   SpO2 99%   BMI 22.31 kg/m  Gen:   Awake, no distress   Resp:  Normal effort  MSK:   Moves extremities without difficulty  Other:    Medical Decision Making  Medically screening exam initiated at 3:43 PM.  Appropriate orders placed.  Madelline Eshbach was informed that the remainder of the evaluation will be completed by another provider, this initial triage assessment does not replace that evaluation, and the importance of remaining in the ED until their evaluation is complete.    Claude Manges, PA-C 11/13/22 1544

## 2022-11-13 NOTE — ED Provider Notes (Signed)
[place] EMERGENCY DEPARTMENT AT [facility] Provider Note   CSN: [id number] Arrival date & time: 11/13/22  1440     History  No chief complaint on file.   Rose Bridges is a 62 y.o. female with a past medical history significant for alcohol dependence, seizures, osteogenesis imperfecta, history of CVA, and hyperlipidemia who presents to the ED due to generalized abdominal pain that has been ongoing for some time.  Difficult to obtain HPI.  Patient admits to intermittent episodes of constipation followed by diarrhea.  On chronic opioids.  Currently having nonbloody diarrhea.  Also admits to decreased p.o. intake.  Notes she only eats peanut butter and popcorn.  No chest pain or shortness of breath.  Denies any urinary symptoms.  No fever or chills. Appears patient is unhappy with her current living situation at [facility].   History obtained from patient and past medical records. No interpreter used during encounter.       Home Medications Prior to Admission medications   Medication Sig Start Date End Date Taking? Authorizing Provider  cephALEXin (KEFLEX) 500 MG capsule Take 1 capsule (500 mg total) by mouth 4 (four) times daily. 11/13/22  Yes Mannie Stabile, PA-C  aspirin 81 MG chewable tablet Chew by mouth daily.    [provider]  Baclofen 5 MG TABS Take 1 tablet by mouth every 8 (eight) hours as needed for muscle spasms. Patient not taking: Reported on 07/02/2022 04/07/22   Celine Mans, MD  calcitonin, salmon, (MIACALCIN/FORTICAL) 200 UNIT/ACT nasal spray Place 1 spray into alternate nostrils daily. 04/08/22   Celine Mans, MD  calcium carbonate (TUMS - DOSED IN MG ELEMENTAL CALCIUM) 500 MG chewable tablet Chew 1 tablet by mouth 2 (two) times daily.    [provider]  docusate sodium (COLACE) 100 MG capsule Take 100 mg by mouth 2 (two) times daily.    [provider]  FLUoxetine (PROZAC) 10 MG tablet Take 10 mg by mouth  daily.    [provider]  folic acid (FOLVITE) 1 MG tablet Take 1 tablet (1 mg total) by mouth daily. 09/09/19   Storm Frisk, MD  hydrOXYzine (ATARAX) 10 MG tablet Take 1 tablet (10 mg total) by mouth 3 (three) times daily as needed for anxiety. 05/25/22   Noralee Stain, DO  levETIRAcetam (KEPPRA) 500 MG tablet Take 1 tablet (500 mg total) by mouth 2 (two) times daily. 05/18/22   Leatha Gilding, MD  magnesium gluconate (MAGONATE) 500 MG tablet Take 500 mg by mouth 2 (two) times daily.    [provider]  oxyCODONE (ROXICODONE) 5 MG immediate release tablet Take 1 tablet (5 mg total) by mouth every 6 (six) hours. Patient taking differently: Take 5 mg by mouth See admin instructions. Every 10 hours 05/25/22   Noralee Stain, DO  oxyCODONE (ROXICODONE) 5 MG immediate release tablet Take 1 tablet (5 mg total) by mouth every 4 (four) hours as needed for severe pain or breakthrough pain. Can take tablet for breakthrough on top of her scheduled oxycodone Patient not taking: Reported on 07/02/2022 05/25/22   Noralee Stain, DO  pantoprazole (PROTONIX) 40 MG tablet Take 1 tablet (40 mg total) by mouth 2 (two) times daily before a meal. 07/05/22 07/05/23  Mansouraty, Netty Starring., MD  polyethylene glycol (MIRALAX / GLYCOLAX) 17 g packet Take 17 g by mouth daily.    [provider]  senna (SENOKOT) 8.6 MG TABS tablet Take 1 tablet by mouth.  [provider]  thiamine 100 MG tablet Take 1 tablet (100 mg total) by mouth daily. 09/09/19   Storm Frisk, MD  Vitamin D, Ergocalciferol, (DRISDOL) 1.25 MG (50000 UNIT) CAPS capsule Take 1 capsule (50,000 Units total) by mouth every 7 (seven) days. Patient taking differently: Take 50,000 Units by mouth every 7 (seven) days. Thursdays 04/12/22   Celine Mans, MD      Allergies    Codeine    Review of Systems   Review of Systems  Constitutional:  Negative for chills and fever.  Respiratory:  Negative for shortness of  breath.   Cardiovascular:  Negative for chest pain.  Gastrointestinal:  Positive for abdominal pain, constipation and diarrhea. Negative for nausea and vomiting.    Physical Exam Updated Vital Signs BP 109/63   Pulse 68   Temp 98 F (36.7 C) (Oral)   Resp 16   Ht 5\' 4"  (1.626 m)   Wt 59 kg   SpO2 97%   BMI 22.31 kg/m  Physical Exam Vitals and nursing note reviewed.  Constitutional:      General: She is not in acute distress.    Appearance: She is not ill-appearing.  HENT:     Head: Normocephalic.  Eyes:     Pupils: Pupils are equal, round, and reactive to light.  Cardiovascular:     Rate and Rhythm: Normal rate and regular rhythm.     Pulses: Normal pulses.     Heart sounds: Normal heart sounds. No murmur heard.    No friction rub. No gallop.  Pulmonary:     Effort: Pulmonary effort is normal.     Breath sounds: Normal breath sounds.  Abdominal:     General: Abdomen is flat. There is no distension.     Palpations: Abdomen is soft.     Tenderness: There is abdominal tenderness. There is no guarding or rebound.     Comments: Diffuse tenderness.   Musculoskeletal:        General: Normal range of motion.     Cervical back: Neck supple.  Skin:    General: Skin is warm and dry.  Neurological:     General: No focal deficit present.     Mental Status: She is alert.  Psychiatric:        Mood and Affect: Mood normal.        Behavior: Behavior normal.     ED Results / Procedures / Treatments   Labs (all labs ordered are listed, but only abnormal results are displayed) Labs Reviewed  CBC WITH DIFFERENTIAL/PLATELET - Abnormal; Notable for the following components:      Result Value   HCT 47.7 (*)    All other components within normal limits  COMPREHENSIVE METABOLIC PANEL - Abnormal; Notable for the following components:   Sodium 134 (*)    Glucose, Bld 104 (*)    All other components within normal limits  URINALYSIS, ROUTINE W REFLEX MICROSCOPIC - Abnormal; Notable  for the following components:   APPearance CLOUDY (*)    Hgb urine dipstick SMALL (*)    Leukocytes,Ua LARGE (*)    Bacteria, UA MANY (*)    All other components within normal limits  URINE CULTURE  LIPASE, BLOOD    EKG None  Radiology CT ABDOMEN PELVIS W CONTRAST  Result Date: 11/13/2022 CLINICAL DATA:  Acute nonlocalized abdominal pain EXAM: CT ABDOMEN AND PELVIS WITH CONTRAST TECHNIQUE: Multidetector CT imaging of the abdomen and pelvis was performed using the standard protocol  following bolus administration of intravenous contrast. RADIATION DOSE REDUCTION: This exam was performed according to the departmental dose-optimization program which includes automated exposure control, adjustment of the mA and/or kV according to patient size and/or use of iterative reconstruction technique. CONTRAST:  75mL OMNIPAQUE IOHEXOL 350 MG/ML SOLN COMPARISON:  CT abdomen and pelvis 05/29/2022 FINDINGS: Lower chest: No acute abnormality. Hepatobiliary: Unchanged intra and extrahepatic biliary dilation. Unremarkable gallbladder. No radiopaque stone. Unremarkable liver. Pancreas: Unremarkable. No pancreatic ductal dilatation or surrounding inflammatory changes. Spleen: Unremarkable. Adrenals/Urinary Tract: Stable adrenal glands. No urinary calculi or hydronephrosis. The bladder is partially obscured by streak artifact. Stomach/Bowel: Normal caliber large and small bowel. No bowel wall thickening. Normal appendix. Stomach is within normal limits. 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 Vascular/Lymphatic: Aortic atherosclerosis. No enlarged abdominal or pelvic lymph nodes. Reproductive: Hysterectomy. Other: No free intraperitoneal fluid or air. Musculoskeletal: No acute fracture or destructive osseous lesion. Unchanged chronic compression of the superior endplate of L3 and L1. Left hip hemiarthroplasty. Demineralization. IMPRESSION: 1. No acute abnormality in the abdomen or pelvis. 2. Unchanged intra and  extrahepatic biliary dilation. 3. Stable large duodenal diverticula. Aortic Atherosclerosis (ICD10-I70.0). Electronically Signed   By: Minerva Fester M.D.   On: 11/13/2022 20:15    Procedures Procedures    Medications Ordered in ED Medications  sodium chloride 0.9 % bolus 1,000 mL (0 mLs Intravenous Stopped 11/13/22 1917)  iohexol (OMNIPAQUE) 350 MG/ML injection 75 mL (75 mLs Intravenous Contrast Given 11/13/22 1836)  cefTRIAXone (ROCEPHIN) 1 g in sodium chloride 0.9 % 100 mL IVPB (0 g Intravenous Stopped 11/13/22 2056)    ED Course/ Medical Decision Making/ A&P Clinical Course as of 11/13/22 2105  Tue Nov 13, 2022  2004 Leukocytes,Ua(!): LARGE [CA]  2004 WBC, UA: >50 [CA]  2004 Bacteria, UA(!): MANY [CA]    Clinical Course User Index [CA] Mannie Stabile, PA-C                             Medical Decision Making Amount and/or Complexity of Data Reviewed External Data Reviewed: notes.    Details: Endoscopy results Labs: ordered. Decision-making details documented in ED Course. Radiology: ordered and independent interpretation performed. Decision-making details documented in ED Course. ECG/medicine tests: ordered and independent interpretation performed. Decision-making details documented in ED Course.  Risk Prescription drug management.   This patient presents to the ED for concern of abdominal pain, this involves an extensive number of treatment options, and is a complaint that carries with it a high risk of complications and morbidity.  The differential diagnosis includes acute cystitis, bowel obstruction, fecal impaction, diverticulitis, etc  62 year old female presents to the ED from [facility] health due to diffuse abdominal pain associated with diarrhea.  Admits to chronic constipation.  Currently on chronic opioids.  No chest pain or shortness of breath.  Upon arrival, vitals all within normal limits.  Patient in no acute distress.  Abdomen soft, nondistended with diffuse  tenderness.  Patient does not fully allow me to examine her abdomen and yells "stop pressing on my belly".  Very difficult to obtain full history from patient.  Routine labs ordered in triage.  Added CT abdomen to rule out evidence of bowel obstruction versus fecal impaction vs. Other etiologies of pain.  Patient having loose stool during initial evaluation while in bed.  Stool is nonbloody.  CBC reassuring.  No leukocytosis.  Normal hemoglobin.  CMP reassuring.  Normal renal function.  Mild hyponatremia 134.  Normal lipase.  UA significant for large leukocytes, greater than 50 white blood cells and many bacteria.  Patient notes she is unable to tell if she has any dysuria.  Will treat for acute cystitis which could be causing patient's pain.  1 dose of Rocephin given.  Patient discharged with Keflex.  Urine culture pending.  CT abdomen personally reviewed and interpreted which negative for any acute abnormalities.  Unchanged intra and extrahepatic biliary dilatation.  Patient had a recent endoscopy with reassuring results.  No right upper quadrant tenderness to suggest acute cholecystitis. EKG NSR. No signs of acute ischemia. Low suspicion for atypical ACS.   Will treat for UTI.  Patient able to tolerate p.o. at bedside.  Advised patient to follow-up with PCP in the next few days for recheck. Strict ED precautions discussed with patient. Patient states understanding and agrees to plan. Patient discharged home in no acute distress and stable vitals  Lives at living facility Hx of alcohol abuse Has PCP       Final Clinical Impression(s) / ED Diagnoses Final diagnoses:  Acute cystitis with hematuria  Generalized abdominal pain    Rx / DC Orders ED Discharge Orders          Ordered    cephALEXin (KEFLEX) 500 MG capsule  4 times daily        11/13/22 2105              Jesusita Oka 11/13/22 2106    Benjiman Core, MD 11/13/22 2255

## 2022-11-14 ENCOUNTER — Other Ambulatory Visit: Payer: Self-pay

## 2022-11-15 LAB — URINE CULTURE

## 2022-11-16 ENCOUNTER — Other Ambulatory Visit: Payer: Self-pay

## 2022-11-20 ENCOUNTER — Other Ambulatory Visit: Payer: Self-pay

## 2022-12-06 ENCOUNTER — Other Ambulatory Visit: Payer: Self-pay

## 2022-12-06 ENCOUNTER — Emergency Department (HOSPITAL_COMMUNITY)
Admission: EM | Admit: 2022-12-06 | Discharge: 2022-12-06 | Disposition: A | Discharge status: Home / Self Care | Payer: Medicare Other | Attending: Emergency Medicine | Admitting: Emergency Medicine

## 2022-12-06 DIAGNOSIS — F69 Unspecified disorder of adult personality and behavior: Secondary | ICD-10-CM | POA: Insufficient documentation

## 2022-12-06 DIAGNOSIS — K5900 Constipation, unspecified: Secondary | ICD-10-CM | POA: Diagnosis not present

## 2022-12-06 DIAGNOSIS — Z7982 Long term (current) use of aspirin: Secondary | ICD-10-CM | POA: Diagnosis not present

## 2022-12-06 DIAGNOSIS — F489 Nonpsychotic mental disorder, unspecified: Secondary | ICD-10-CM

## 2022-12-06 LAB — URINALYSIS, ROUTINE W REFLEX MICROSCOPIC
Bilirubin Urine: NEGATIVE
Glucose, UA: NEGATIVE mg/dL
Hgb urine dipstick: NEGATIVE
Ketones, ur: NEGATIVE mg/dL
Leukocytes,Ua: NEGATIVE
Nitrite: NEGATIVE
Protein, ur: NEGATIVE mg/dL
Specific Gravity, Urine: 1.02 (ref 1.005–1.030)
pH: 5.5 (ref 5.0–8.0)

## 2022-12-06 NOTE — Discharge Instructions (Addendum)
Patient urine shows no evidence of infection.  She does not have to take antibiotics.

## 2022-12-06 NOTE — ED Notes (Signed)
PT IVC PAPERWORK HAS BEEN RESCINDED, ORIGINAL COPY IN RED FOLDER, AND ENVELOPE NUMBER IS [id number]

## 2022-12-06 NOTE — ED Triage Notes (Signed)
Patient BIB [name] from [facility] place after the facility IVC'ed the patient for aggressive and uncooperative behavior. Patient has reportedly refused medication and refused meals. Patient's only complaint is constipation and abdominal pain. EMS reports the patient was cooperative en route. VSS. BP 140/76 HR 92, 95% RA, RR 18

## 2022-12-06 NOTE — ED Provider Notes (Signed)
EMERGENCY DEPARTMENT AT [facility] Provider Note   CSN: [id number] Arrival date & time: 12/06/22  1423     History  Chief Complaint  Patient presents with   Constipation    Rose Bridges is a 62 y.o. female who presents the emergency department under involuntary commitment from [facility] nursing home.  Past medical history of osteogenesis imperfecta, chronic abdominal issues.  According to the IVC paperwork the patient has been noncompliant with medications and acting aggressively..  Patient reports that she was staging a protest because it takes to the facility for 3 hours to come and change you when you are sitting in your own stool.  She states that she was giving her roommate Tylenol because she was having severe pain in her hip and would take 3 or 4 hours for the CNA's to deliver Tylenol.  Patient reports that she does not want to be here and she has no medical complaints.  She reports that she did not take her Levaquin because the last time she was here they tried to cath her but she urinated before she was cathed and felt that that would be a highly contaminated urine considering she had been having diarrhea and thought that it was "silly to take an antibiotic with that of urinalysis." I spoke with Gerre Pebbles who is the manager of [facility] where the patient is currently residing.  He states that they sent her here because "we needed to get her out of the facility so we could get her transferred to another skilled nursing facility" and because she was "dangerous to other patients because she was giving him Tylenol."   Constipation      Home Medications Prior to Admission medications   Medication Sig Start Date End Date Taking? Authorizing Provider  aspirin 81 MG chewable tablet Chew by mouth daily.    [provider]  Baclofen 5 MG TABS Take 1 tablet by mouth every 8 (eight) hours as needed for muscle spasms. Patient not taking: Reported on  07/02/2022 04/07/22   Celine Mans, MD  calcitonin, salmon, (MIACALCIN/FORTICAL) 200 UNIT/ACT nasal spray Place 1 spray into alternate nostrils daily. 04/08/22   Celine Mans, MD  calcium carbonate (TUMS - DOSED IN MG ELEMENTAL CALCIUM) 500 MG chewable tablet Chew 1 tablet by mouth 2 (two) times daily.    [provider]  cephALEXin (KEFLEX) 500 MG capsule Take 1 capsule (500 mg total) by mouth 4 (four) times daily. 11/13/22   Mannie Stabile, PA-C  docusate sodium (COLACE) 100 MG capsule Take 100 mg by mouth 2 (two) times daily.    [provider]  FLUoxetine (PROZAC) 10 MG tablet Take 10 mg by mouth daily.    [provider]  folic acid (FOLVITE) 1 MG tablet Take 1 tablet (1 mg total) by mouth daily. 09/09/19   Storm Frisk, MD  hydrOXYzine (ATARAX) 10 MG tablet Take 1 tablet (10 mg total) by mouth 3 (three) times daily as needed for anxiety. 05/25/22   Noralee Stain, DO  levETIRAcetam (KEPPRA) 500 MG tablet Take 1 tablet (500 mg total) by mouth 2 (two) times daily. 05/18/22   Leatha Gilding, MD  magnesium gluconate (MAGONATE) 500 MG tablet Take 500 mg by mouth 2 (two) times daily.    [provider]  oxyCODONE (ROXICODONE) 5 MG immediate release tablet Take 1 tablet (5 mg total) by mouth every 6 (six) hours. Patient taking differently: Take 5 mg by mouth See admin  instructions. Every 10 hours 05/25/22   Noralee Stain, DO  oxyCODONE (ROXICODONE) 5 MG immediate release tablet Take 1 tablet (5 mg total) by mouth every 4 (four) hours as needed for severe pain or breakthrough pain. Can take tablet for breakthrough on top of her scheduled oxycodone Patient not taking: Reported on 07/02/2022 05/25/22   Noralee Stain, DO  pantoprazole (PROTONIX) 40 MG tablet Take 1 tablet (40 mg total) by mouth 2 (two) times daily before a meal. 07/05/22 07/05/23  Mansouraty, Netty Starring., MD  polyethylene glycol (MIRALAX / GLYCOLAX) 17 g packet Take 17 g by mouth daily.     [provider]  senna (SENOKOT) 8.6 MG TABS tablet Take 1 tablet by mouth.    [provider]  thiamine 100 MG tablet Take 1 tablet (100 mg total) by mouth daily. 09/09/19   Storm Frisk, MD  Vitamin D, Ergocalciferol, (DRISDOL) 1.25 MG (50000 UNIT) CAPS capsule Take 1 capsule (50,000 Units total) by mouth every 7 (seven) days. Patient taking differently: Take 50,000 Units by mouth every 7 (seven) days. Thursdays 04/12/22   Celine Mans, MD      Allergies    Codeine    Review of Systems   Review of Systems  Gastrointestinal:  Positive for constipation.    Physical Exam Updated Vital Signs SpO2 95%  Physical Exam Vitals and nursing note reviewed.  Constitutional:      General: She is not in acute distress.    Appearance: She is well-developed. She is not diaphoretic.  HENT:     Head: Normocephalic and atraumatic.     Right Ear: External ear normal.     Left Ear: External ear normal.     Nose: Nose normal.     Mouth/Throat:     Mouth: Mucous membranes are moist.  Eyes:     General: No scleral icterus.    Conjunctiva/sclera: Conjunctivae normal.  Cardiovascular:     Rate and Rhythm: Normal rate and regular rhythm.     Heart sounds: Normal heart sounds. No murmur heard.    No friction rub. No gallop.  Pulmonary:     Effort: Pulmonary effort is normal. No respiratory distress.     Breath sounds: Normal breath sounds.  Abdominal:     General: Bowel sounds are normal. There is no distension.     Palpations: Abdomen is soft. There is no mass.     Tenderness: There is no abdominal tenderness. There is no guarding.  Musculoskeletal:     Cervical back: Normal range of motion.  Skin:    General: Skin is warm and dry.  Neurological:     Mental Status: She is alert and oriented to person, place, and time.  Psychiatric:     Comments: Patient is irritable however very logical and definitely not psychotic.  She has no new complaints at this time outside of  her chronic medical conditions..      ED Results / Procedures / Treatments   Labs (all labs ordered are listed, but only abnormal results are displayed) Labs Reviewed - No data to display  EKG None  Radiology No results found.  Procedures Procedures    Medications Ordered in ED Medications - No data to display  ED Course/ Medical Decision Making/ A&P                                 Medical Decision Making Amount and/or Complexity  of Data Reviewed Labs: ordered.   I discussed the case via phone call with the manager at the skilled nursing facility.  I have informed him that I will be resending this patient's involuntary commitment because she is she is having behavioral issues and is not psychotic in any way.   Patient did request a catheterized urinalysis which we are obtaining and will run for culture.  At this time plan will be to return her to the [facility] facility where she will need to be housed until she is transferred. \   62 year old female here for behavioral  issues.  Is negative for any evidence of infection.  She does not need to continue with Levaquin.   I called and spoke with Gerre Pebbles and a another person who was unnamed on the phone and conference call and have let the know that behavioral disturbances are not an issue that we addressed.   Her IVC has been rescinded.     they were upset stating that she was door Dash eating medications to give to another patient which could be harmful.      This again seems like an issue with facility management and not a psychiatric show it is behavioral.    They asked if the patient had seen a psychiatrist.  I discussed that both myself and the medical director of the emergency department had seen the patient and did not feel that she needed any psychiatric intervention or hospitalization.    Patient is coherent and certainly difficult.  She has some behavioral   Issues but does not appear to need any medical or  psychiatric interventions. she is safe for discharge and have a informed the facility that she will be returning to their care.    I discussed the case with Carole Binning, , NP who is the psychiatric nurse practitioner at [facility].  She reiterated that the patient has been having some manic episodes but admits that she is not overly manic.  She also states that the patient has not been taking her medications and has been giving the staff a lot of problems.  Discussed the role and scope of the emergency department with her that includes so theatric emergencies such as over psychosis suicidal ideation or homicidal ideation.  She and I both agreed that the patient is clearly aware of what she is doing and is having behavioral issues.  I again reiterated that if they are having problems at her nursing facility with her for [facility] medication and having it to other patients that that is a facility issue and not a medical emergency.        Final Clinical Impression(s) / ED Diagnoses Final diagnoses:  Mental and behavioral problem in adult    Rx / DC Orders ED Discharge Orders     None         Arthor Captain, PA-C 12/06/22 1715    Margarita Grizzle, MD 12/09/22 1513

## 2023-01-29 ENCOUNTER — Ambulatory Visit (INDEPENDENT_AMBULATORY_CARE_PROVIDER_SITE_OTHER)
Admission: RE | Admit: 2023-01-29 | Discharge: 2023-01-29 | Disposition: A | Discharge status: Home / Self Care | Payer: Medicare Other | Source: Ambulatory Visit | Attending: Gastroenterology | Admitting: Gastroenterology

## 2023-01-29 ENCOUNTER — Encounter: Payer: Self-pay | Admitting: Gastroenterology

## 2023-01-29 ENCOUNTER — Ambulatory Visit (INDEPENDENT_AMBULATORY_CARE_PROVIDER_SITE_OTHER): Payer: Medicare Other | Admitting: Gastroenterology

## 2023-01-29 VITALS — BP 124/80 | HR 95 | Ht 64.0 in

## 2023-01-29 DIAGNOSIS — K5710 Diverticulosis of small intestine without perforation or abscess without bleeding: Secondary | ICD-10-CM | POA: Diagnosis not present

## 2023-01-29 DIAGNOSIS — Q780 Osteogenesis imperfecta: Secondary | ICD-10-CM

## 2023-01-29 DIAGNOSIS — K269 Duodenal ulcer, unspecified as acute or chronic, without hemorrhage or perforation: Secondary | ICD-10-CM

## 2023-01-29 DIAGNOSIS — Z8719 Personal history of other diseases of the digestive system: Secondary | ICD-10-CM

## 2023-01-29 DIAGNOSIS — K5900 Constipation, unspecified: Secondary | ICD-10-CM

## 2023-01-29 DIAGNOSIS — K828 Other specified diseases of gallbladder: Secondary | ICD-10-CM | POA: Diagnosis not present

## 2023-01-29 DIAGNOSIS — Z1211 Encounter for screening for malignant neoplasm of colon: Secondary | ICD-10-CM

## 2023-01-29 MED ORDER — PANTOPRAZOLE SODIUM 20 MG PO TBEC: 40.0000 mg | DELAYED_RELEASE_TABLET | Freq: Every day | ORAL | Status: DC

## 2023-01-29 NOTE — Progress Notes (Signed)
HPI :  62 year old female here for a follow-up visit for history of pancreatitis, duodenal diverticulum, gallbladder sludge, duodenal ulcer.  Recall I met her when she was hospitalized in January with pancreatitis. She was initially admitted in December 2023 with pelvic fracture, discharged to SNF. Presented to the ED on January 11 with confusion, agitation, found to have UTI and metabolic encephalopathy.  Given ceftriaxone for the UTI. On January 13 developed severe pain and vomiting.  CT scan showed acute pancreatitis with biliary ductal dilation.  Lipase initially over 2000, Alk phos 144, ALT 37, AST 43  Previously alk phos 160 AST 84, T. bili has been normal.  Triglycerides done and normal.  She does not drink alcohol.  Of note on CT scan she has a very large proximal duodenal diverticulum, roughly 6 cm.  She had an MRCP to rule out biliary obstruction.  Dilated common bile duct upwards of 15 mm with some moderate central intrahepatic ductal dilation.  There was no obvious filling defect noted but there is an abrupt transition at the ampulla with a mild to moderately dilated pancreatic duct without stricture.  There is no overt pancreatic mass, ampullary lesion cannot be excluded.   She was discharged and had a follow up EUS with Dr. Meridee Score: 07/05/2022:  EGD impression: - No gross lesions in the entire esophagus. Z-line regular, 35 cm from the incisors. - 1 cm hiatal hernia. - Gastritis. Biopsied. - Duodenitis and duodenal ulcers noted. Biopsied. - Duodenal diverticulum in D2 (just proximal to the presumed major papilla). - Normal major papilla that was hidden under a hood (did not unfortunately see any bile pancreatic juices drain however).  EUS impression: - There was dilation in the common bile duct and in the common hepatic duct. There was no evidence of any retained choledocholithiasis/micro choledocholithiasis. Smooth tapering noted of the bile duct to the ampulla. -Minimal amount of  hyperechoic material consistent with sludge was visualized endosonographically in the gallbladder. No overt cholelithiasis - Pancreatic parenchymal abnormalities consisting of diffusely increased echogenicity were noted in the pancreatic head, genu of the pancreas, pancreatic body and pancreatic tail. The pancreatic tail was otherwise normal. - No ampullary intramural lesions noted. - No malignant-appearing lymph nodes were visualized in the celiac region (level 20), peripancreatic region and porta hepatis region. - I suspect that the patient's biliary ductal dilation is likely a result of the more proximal duodenal diverticulum and the way that it interacts in the region of the pancreatic head. This will need continued monitoring.  Etiology of patient's pancreatitis was unclear -differential diagnosis included relation to the large duodenal diverticulum, gallbladder sludge, versus drug-induced reaction from the antibiotic.  Dr. Meridee Score recommended consideration for repeat MRCP in 6 to 12 months after the EUS.  She currently resides in her nursing home.  She has not had recurrent pancreatitis that we are aware of.  She did have abdominal pain that led to an ED visit and a CT scan in July, there was no pancreatitis on that exam, stable intra and extrahepatic biliary ductal dilation with stable appearance of the large duodenal diverticulum.  She voiced her frustration with her current living situation on numerous occasions today.  She states she lives in a very clean facility and she likes it there, however she states the "care and food sucks".  She has a hard time with the food she eats, she states there is very heavy and fatty/greasy foods and it is hard to eat a healthy diet there in  regards to her history of pancreatitis.  She request to be referred to a nutrition or dietitian to see if they can help her with this.  She otherwise has been taking Protonix 40 mg twice daily since her last endoscopy with Dr.  Meridee Score.  She denies any heartburn and states she is eating okay in regards to tolerance.  We discussed long-term plan of Protonix.  She has not had a colonoscopy in 20 to 30 years.  She states her main bowel problem tends to be constipation.  She does not like using the laxatives provided at the nursing home and has been much happier using prune juice and she titrates that as needed up or down.  We discussed if she want to pursue any further colonoscopy exams.  She does have osteogenesis imperfecta with reported osteoporosis.  She inquires if we can order her a follow-up DEXA scan as she has not had one in a while.  She is amenable to doing a colonoscopy if needed although bowel prep will be extremely difficult for her in her current living situation, she frequently states she is sitting in feces and has to wait a long time to be changed when she has a bowel movement.  She is not able to ambulate on her own.   CT abdomen / pelvis 11/13/22: IMPRESSION: 1. No acute abnormality in the abdomen or pelvis. 2. Unchanged intra and extrahepatic biliary dilation. 3. Stable large duodenal diverticula.   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[facility])    Metabolic encephalopathy    Osteogenesis imperfecta    Osteogenesis imperfecta    Osteoporosis    Personality disorder (HCC)    PVD (peripheral vascular disease) (HCC)    Stroke [facility])      Past Surgical History:  Procedure Laterality Date   ABDOMINAL HYSTERECTOMY     BIOPSY  07/05/2022   Procedure: BIOPSY;  Surgeon: Lemar Lofty., MD;  Location: [facility];  Service: Gastroenterology;;    ESOPHAGOGASTRODUODENOSCOPY N/A 07/05/2022   Procedure: ESOPHAGOGASTRODUODENOSCOPY (EGD);  Surgeon: Lemar Lofty., MD;  Location: [facility];  Service: Gastroenterology;  Laterality: N/A;   EUS N/A 07/05/2022   Procedure: UPPER ENDOSCOPIC ULTRASOUND (EUS) LINEAR;  Surgeon: Lemar Lofty., MD;  Location: [facility];  Service: Gastroenterology;  Laterality: N/A;   HIP ARTHROPLASTY Left 09/26/2015   Procedure: ARTHROPLASTY BIPOLAR HIP (HEMIARTHROPLASTY);  Surgeon: Sheral Apley, MD;  Location: [facility] OR;  Service: Orthopedics;  Laterality: Left;   ORIF HUMERUS FRACTURE Right 11/06/2016   Procedure: OPEN REDUCTION INTERNAL FIXATION (ORIF) PROXIMAL HUMERUS FRACTURE;  Surgeon: Sheral Apley, MD;  Location: [facility] OR;  Service: Orthopedics;  Laterality: Right;   Family History  Problem Relation Age of Onset   Stroke Mother    Heart attack Mother    Hypertension Mother    Stroke Sister    Stroke Brother    Lung cancer Father    Social History   Tobacco Use   Smoking status: Every Day    Current packs/day: 0.25    Types: Cigarettes   Smokeless tobacco: Never  Vaping Use  Vaping status: Never Used  Substance Use Topics   Alcohol use: No    Alcohol/week: 85.0 standard drinks of alcohol    Types: 85 Shots of liquor per week    Comment: No alcohol use since 11/04/16.   Drug use: No   Current Outpatient Medications  Medication Sig Dispense Refill   aspirin 81 MG chewable tablet Chew by mouth daily.     Baclofen 5 MG TABS Take 1 tablet by mouth every 8 (eight) hours as needed for muscle spasms. 30 each 0   calcitonin, salmon, (MIACALCIN/FORTICAL) 200 UNIT/ACT nasal spray Place 1 spray into alternate nostrils daily. 3.7 mL 12   calcium carbonate (TUMS - DOSED IN MG ELEMENTAL CALCIUM) 500 MG chewable tablet Chew 1 tablet by mouth 2 (two) times daily.     cephALEXin (KEFLEX) 500 MG capsule Take 1 capsule (500 mg total) by mouth 4 (four) times daily. 20 capsule 0   folic acid  (FOLVITE) 1 MG tablet Take 1 tablet (1 mg total) by mouth daily. 90 tablet 3   levETIRAcetam (KEPPRA) 500 MG tablet Take 1 tablet (500 mg total) by mouth 2 (two) times daily.     magnesium gluconate (MAGONATE) 500 MG tablet Take 500 mg by mouth 2 (two) times daily.     oxyCODONE (ROXICODONE) 5 MG immediate release tablet Take 1 tablet (5 mg total) by mouth every 4 (four) hours as needed for severe pain or breakthrough pain. Can take tablet for breakthrough on top of her scheduled oxycodone 30 tablet 0   thiamine 100 MG tablet Take 1 tablet (100 mg total) by mouth daily. 90 tablet 2   Vitamin D, Ergocalciferol, (DRISDOL) 1.25 MG (50000 UNIT) CAPS capsule Take 1 capsule (50,000 Units total) by mouth every 7 (seven) days. (Patient taking differently: Take 50,000 Units by mouth every 7 (seven) days. Thursdays) 5 capsule    docusate sodium (COLACE) 100 MG capsule Take 100 mg by mouth 2 (two) times daily.     FLUoxetine (PROZAC) 10 MG tablet Take 10 mg by mouth daily.     hydrOXYzine (ATARAX) 10 MG tablet Take 1 tablet (10 mg total) by mouth 3 (three) times daily as needed for anxiety. 30 tablet 0   oxyCODONE (ROXICODONE) 5 MG immediate release tablet Take 1 tablet (5 mg total) by mouth every 6 (six) hours. (Patient taking differently: Take 5 mg by mouth See admin instructions. Every 10 hours) 28 tablet 0   pantoprazole (PROTONIX) 20 MG tablet Take 2 tablets (40 mg total) by mouth daily.     polyethylene glycol (MIRALAX / GLYCOLAX) 17 g packet Take 17 g by mouth daily.     senna (SENOKOT) 8.6 MG TABS tablet Take 1 tablet by mouth.     No current facility-administered medications for this visit.   Allergies  Allergen Reactions   Codeine Other (See Comments)    Migraine      Review of Systems: All systems reviewed and negative except where noted in HPI.   Lab Results  Component Value Date   WBC 6.4 11/13/2022   HGB 15.0 11/13/2022   HCT 47.7 (H) 11/13/2022   MCV 97.7 11/13/2022   PLT 248  11/13/2022    Lab Results  Component Value Date   NA 134 (L) 11/13/2022   CL 100 11/13/2022   K 4.2 11/13/2022   CO2 25 11/13/2022   BUN 11 11/13/2022   CREATININE 0.72 11/13/2022   GFRNONAA >60 11/13/2022   CALCIUM 8.9 11/13/2022  PHOS 3.0 05/10/2022   ALBUMIN 3.5 11/13/2022   GLUCOSE 104 (H) 11/13/2022    Lab Results  Component Value Date   ALT 30 11/13/2022   AST 36 11/13/2022   ALKPHOS 107 11/13/2022   BILITOT 0.5 11/13/2022   Lab Results  Component Value Date   LIPASE 28 11/13/2022    Physical Exam: BP 124/80   Pulse 95   Ht 5\' 4"  (1.626 m)   BMI 22.31 kg/m  Constitutional: Pleasant, female in no acute distress, sitting in wheelchair Psychiatric: Normal mood and affect. Behavior is normal.   ASSESSMENT: 62 y.o. female here for assessment of the following  1. History of pancreatitis   2. Duodenal diverticulum   3. Gallbladder sludge   4. Duodenal ulcer   5. Constipation, unspecified constipation type   6. Colon cancer screening   7. History of osteogenesis imperfecta    We discussed a few issues today for a bit.  History of pancreatitis with hospitalization in January.  It remains unclear exactly what precipitated this.  Differential diagnosis included drug reaction from antibiotic, gallbladder sludge, versus large duodenal diverticulum.  I spent some time discussing each of these entities with her today.  She denies any typical biliary colic at baseline.  She inquired if she needed surgery for these issues, I advised against surgical resection of duodenal diverticulum or her gallbladder at this time as I am not sure if either of these were related to her pancreatitis and in particular surgical treatment for duodenal diverticulum is not a minor surgery and would be difficult for her.  We review results of her EUS, I do not see any evidence of malignancy or high risk pathology here.  Fortunately she has not had recurrent pancreatitis since that time.  I  discussed potential risk for recurrent pancreatitis moving forward.  Hopefully that does not happen if it does one could consider empiric cholecystectomy.  Her last CT scan in July looked okay.  Dr. Meridee Score had recommended an interval MRCP at some point following her last EUS.  I think okay to space this out until March of next year, 12 months from her EUS, given she had stable CT changes this past July.  She is agreeable with that.  If she has recurrent pancreatitis in the interim she will let me know.  Agree that she should try to eat a low-fat diet if possible.  She is quite frustrated with the food she receives at her facility.  She is requesting to be referred to nutritionist/dietitian and we will set her up for that.  Reviewed her duodenal ulcers noted on EGD.  She has been taking high-dose PPI since that time.  She has no symptoms from this currently.  I recommend dose reduction of her Protonix to 20 mg a day from 80 mg daily to see if she tolerates this initially.  We discussed risks of chronic PPI use to include affecting her bone health.  She is concerned about her history of osteoporosis and requesting another DEXA scan, I will order that for her.  Over time we can try to take her off PPI if she tolerates it.  Minimize NSAID use.  Will await DEXA first.  Reviewed her bowel habits, continue eating prunes if that is her preference to manage this.  We discussed if she wanted to pursue screening colonoscopy.  She is going to have a very difficult time with a bowel preparation in order to do this.  If she wants to pursue  screening at initially recommend a Cologuard.  She understands if this is positive she would be willing to do colonoscopy if needed.  I suspect her case may need to be done at the hospital if she were to need a colonoscopy given her inability to bear weight.   PLAN: - recall MRCP in March 2025 - refer to nutritionist / dietician about low fat diet - if recurrent pancreatitis could  consider empiric cholecystectomy pending her course - reduce protonix to 20mg  / day from 40mg  BID - given reported history of osteoporosis will try to minimize PPI dose - ordered DEXA scan per her request - continue prunes daily for constipation - Cologuard for CRC screening as outlined  Harlin Rain, MD [facility]  Harlin Rain, MD [facility]

## 2023-01-29 NOTE — Patient Instructions (Addendum)
Your provider has ordered Cologuard testing as an option for colon cancer screening. This is performed by [employer] and may be out of network with your insurance. PRIOR to completing the test, it is YOUR responsibility to contact your insurance about covered benefits for this test. Your out of pocket expense could be anywhere from $0.00 to $649.00.   When you call to check coverage with your insurer, please provide the following information:   -The ONLY provider of Cologuard is Opt[facility], dispensing  - CPT code for Cologuard is [phone].  [facility] NPI # [id number]  -[employer] Tax ID # [id number]   We have already sent your demographic and insurance information to [employer] (phone number [phone]) and they should contact you within the next week regarding your test. If you have not heard from them within the next week, please call our office at [phone].  ________________________________________________________________________    You will be due for a recall MRCP in 06-2023. We will send you a reminder in the mail when it gets closer to that time.   We are referring you to a Nutritionist/Dietician.  They will contact you directly to schedule an appointment.  It may take a week or more before you hear from them.  Please feel free to contact us if you have not heard from them within 2 weeks and we will follow up on the referral.   Decrease Protonix to 20 mg once daily.  Continue prunes for constipation.  ______________________________________________________________________  Bonita Quin have been scheduled for a bone density test today. Please proceed to radiology on the basement floor of [facility] location for this test. If you need to reschedule for any reason, please contact radiology at [phone].  Thank you for entrusting me with your care and for choosing [facility], Dr. Ileene Patrick    If your blood  pressure at your visit was 140/90 or greater, please contact your primary care physician to follow up on this. ______________________________________________________  If you are age 44 or older, your body mass index should be between 23-30. Your Body mass index is 22.31 kg/m. If this is out of the aforementioned range listed, please consider follow up with your Primary Care Provider.  If you are age 8 or younger, your body mass index should be between 19-25. Your Body mass index is 22.31 kg/m. If this is out of the aformentioned range listed, please consider follow up with your Primary Care Provider.  ________________________________________________________  The [facility] providers would like to encourage you to use [facility] to communicate with providers for non-urgent requests or questions.  Due to long hold times on the telephone, sending your provider a message by [facility] may be a faster and more efficient way to get a response.  Please allow 48 business hours for a response.  Please remember that this is for non-urgent requests.  _______________________________________________________  Due to recent changes in healthcare laws, you may see the results of your imaging and laboratory studies on MyChart before your provider has had a chance to review them.  We understand that in some cases there may be results that are confusing or concerning to you. Not all laboratory results come back in the same time frame and the provider may be waiting for multiple results in order to interpret others.  Please give [place] 48 hours in order for your provider to thoroughly review all the results before contacting the office for clarification of your results.

## 2023-02-06 ENCOUNTER — Ambulatory Visit (HOSPITAL_BASED_OUTPATIENT_CLINIC_OR_DEPARTMENT_OTHER): Payer: Medicare Other | Admitting: Orthopaedic Surgery

## 2023-02-12 ENCOUNTER — Other Ambulatory Visit: Payer: Self-pay

## 2023-02-12 DIAGNOSIS — M810 Age-related osteoporosis without current pathological fracture: Secondary | ICD-10-CM

## 2023-02-15 ENCOUNTER — Emergency Department (HOSPITAL_COMMUNITY)
Admission: EM | Admit: 2023-02-15 | Discharge: 2023-02-15 | Disposition: A | Discharge status: Home / Self Care | Payer: Medicare Other | Attending: Emergency Medicine | Admitting: Emergency Medicine

## 2023-02-15 ENCOUNTER — Encounter (HOSPITAL_COMMUNITY): Payer: Self-pay

## 2023-02-15 ENCOUNTER — Emergency Department (HOSPITAL_COMMUNITY): Payer: Medicare Other

## 2023-02-15 ENCOUNTER — Other Ambulatory Visit: Payer: Self-pay

## 2023-02-15 DIAGNOSIS — Z7982 Long term (current) use of aspirin: Secondary | ICD-10-CM | POA: Diagnosis not present

## 2023-02-15 DIAGNOSIS — Z79899 Other long term (current) drug therapy: Secondary | ICD-10-CM | POA: Insufficient documentation

## 2023-02-15 DIAGNOSIS — M25551 Pain in right hip: Secondary | ICD-10-CM | POA: Diagnosis not present

## 2023-02-15 DIAGNOSIS — M545 Low back pain, unspecified: Secondary | ICD-10-CM | POA: Diagnosis not present

## 2023-02-15 DIAGNOSIS — M25561 Pain in right knee: Secondary | ICD-10-CM | POA: Diagnosis present

## 2023-02-15 DIAGNOSIS — I10 Essential (primary) hypertension: Secondary | ICD-10-CM | POA: Diagnosis not present

## 2023-02-15 DIAGNOSIS — X503XXA Overexertion from repetitive movements, initial encounter: Secondary | ICD-10-CM

## 2023-02-15 MED ORDER — NAPROXEN 250 MG PO TABS
500.0000 mg | ORAL_TABLET | Freq: Once | ORAL | Status: AC
  Administered 2023-02-15: 500 mg via ORAL
  Filled 2023-02-15: qty 2

## 2023-02-15 MED ORDER — OXYCODONE-ACETAMINOPHEN 5-325 MG PO TABS
1.0000 | ORAL_TABLET | Freq: Once | ORAL | Status: AC
  Administered 2023-02-15: 1 via ORAL
  Filled 2023-02-15: qty 1

## 2023-02-15 MED ORDER — NAPROXEN 375 MG PO TABS: 375.0000 mg | ORAL_TABLET | Freq: Two times a day (BID) | ORAL | 0 refills | Status: AC

## 2023-02-15 MED ORDER — METHOCARBAMOL 500 MG PO TABS
500.0000 mg | ORAL_TABLET | Freq: Once | ORAL | Status: AC
  Administered 2023-02-15: 500 mg via ORAL
  Filled 2023-02-15: qty 1

## 2023-02-15 NOTE — ED Provider Notes (Signed)
[place] EMERGENCY DEPARTMENT AT [facility] Provider Note   CSN: [id number] Arrival date & time: 02/15/23  1633     History  Chief Complaint  Patient presents with   Knee Pain    Rose Bridges is a 62 y.o. female.  With a history of hypertension, osteogenesis imperfecta, bipolar disorder, epilepsy, agoraphobia, peripheral vascular disease, peripheral neuropathy presenting to the ED for evaluation of right hip and knee pain.  She states she was feeling good 3 days ago so she decided to start doing physical therapy at her nursing home.  She states she may have done too much activity as her right hip and knee has progressively gotten more painful.  She has been taking her home pain medications with minimal improvement.  She states she is unhappy with her nursing home so she called EMS and requested that she be transported to the emergency department.  She states the nursing home then canceled EMS so she called again and again requested to be transferred to the emergency department.  She denies any specific falls or trauma to the injury.  Pain is localized to the anterior of the knee and the right lateral aspect of the hip.  No lower extremity swelling.  No recent change in her neuropathy symptoms.  No fevers or chills.  She has urinary incontinence at baseline.  No recent changes.  No fecal incontinence.   Knee Pain Associated symptoms: back pain        Home Medications Prior to Admission medications   Medication Sig Start Date End Date Taking? Authorizing Provider  naproxen (NAPROSYN) 375 MG tablet Take 1 tablet (375 mg total) by mouth 2 (two) times daily for 15 days. 02/15/23 03/02/23 Yes Shaquanta Harkless, Edsel Petrin, PA-C  aspirin 81 MG chewable tablet Chew by mouth daily.    [provider]  Baclofen 5 MG TABS Take 1 tablet by mouth every 8 (eight) hours as needed for muscle spasms. 04/07/22   Celine Mans, MD  calcitonin, salmon, (MIACALCIN/FORTICAL) 200 UNIT/ACT  nasal spray Place 1 spray into alternate nostrils daily. 04/08/22   Celine Mans, MD  calcium carbonate (TUMS - DOSED IN MG ELEMENTAL CALCIUM) 500 MG chewable tablet Chew 1 tablet by mouth 2 (two) times daily.    [provider]  cephALEXin (KEFLEX) 500 MG capsule Take 1 capsule (500 mg total) by mouth 4 (four) times daily. 11/13/22   Mannie Stabile, PA-C  docusate sodium (COLACE) 100 MG capsule Take 100 mg by mouth 2 (two) times daily.    [provider]  FLUoxetine (PROZAC) 10 MG tablet Take 10 mg by mouth daily.    [provider]  folic acid (FOLVITE) 1 MG tablet Take 1 tablet (1 mg total) by mouth daily. 09/09/19   Storm Frisk, MD  gabapentin (NEURONTIN) 100 MG capsule Take 3 capsules (300 mg total) by mouth 3 (three) times daily. 02/12/23   Armbruster, Willaim Rayas, MD  hydrOXYzine (ATARAX) 10 MG tablet Take 1 tablet (10 mg total) by mouth 3 (three) times daily as needed for anxiety. 05/25/22   Noralee Stain, DO  levETIRAcetam (KEPPRA) 500 MG tablet Take 1 tablet (500 mg total) by mouth 2 (two) times daily. 05/18/22   Leatha Gilding, MD  magnesium gluconate (MAGONATE) 500 MG tablet Take 500 mg by mouth 2 (two) times daily.    [provider]  oxyCODONE (ROXICODONE) 5 MG immediate release tablet Take 1 tablet (5 mg total) by mouth every 6 (six) hours.  Patient taking differently: Take 5 mg by mouth See admin instructions. Every 10 hours 05/25/22   Noralee Stain, DO  oxyCODONE (ROXICODONE) 5 MG immediate release tablet Take 1 tablet (5 mg total) by mouth every 4 (four) hours as needed for severe pain or breakthrough pain. Can take tablet for breakthrough on top of her scheduled oxycodone 05/25/22   Noralee Stain, DO  pantoprazole (PROTONIX) 20 MG tablet Take 2 tablets (40 mg total) by mouth daily. 01/29/23   Armbruster, Willaim Rayas, MD  polyethylene glycol (MIRALAX / GLYCOLAX) 17 g packet Take 17 g by mouth daily.    [provider]  senna  (SENOKOT) 8.6 MG TABS tablet Take 1 tablet by mouth.    [provider]  thiamine 100 MG tablet Take 1 tablet (100 mg total) by mouth daily. 09/09/19   Storm Frisk, MD  Vitamin D, Ergocalciferol, (DRISDOL) 1.25 MG (50000 UNIT) CAPS capsule Take 1 capsule (50,000 Units total) by mouth every 7 (seven) days. Patient taking differently: Take 50,000 Units by mouth every 7 (seven) days. Thursdays 04/12/22   Celine Mans, MD      Allergies    Codeine    Review of Systems   Review of Systems  Musculoskeletal:  Positive for arthralgias and back pain.  All other systems reviewed and are negative.   Physical Exam Updated Vital Signs BP 124/86   Pulse 84   Temp 98.1 F (36.7 C) (Oral)   Resp 16   Ht 5\' 4"  (1.626 m)   Wt 59 kg   SpO2 98%   BMI 22.33 kg/m  Physical Exam Vitals and nursing note reviewed.  Constitutional:      General: She is not in acute distress.    Appearance: Normal appearance. She is normal weight. She is not ill-appearing.     Comments: Resting comfortably in bed  HENT:     Head: Normocephalic and atraumatic.  Pulmonary:     Effort: Pulmonary effort is normal. No respiratory distress.  Abdominal:     General: Abdomen is flat.  Musculoskeletal:     Cervical back: Neck supple.     Comments: Minimal ROM of bilateral lower extremities secondary to pain.  Significant TTP to the anterior portion of the right knee.  No lower extremity edema or erythema.  No overlying erythema of the knee.  Moderate TTP of the right hip as well.  Sensation intact in all digits.  Capillary refill is slightly delayed.  DP pulses 2+ bilaterally.  Skin:    General: Skin is warm and dry.  Neurological:     Mental Status: She is alert and oriented to person, place, and time.  Psychiatric:        Mood and Affect: Mood normal.        Behavior: Behavior normal.     ED Results / Procedures / Treatments   Labs (all labs ordered are listed, but only abnormal results are  displayed) Labs Reviewed - No data to display  EKG None  Radiology DG Hip Unilat W or Wo Pelvis 2-3 Views Right  Result Date: 02/15/2023 CLINICAL DATA:  Right hip pain. EXAM: DG HIP (WITH OR WITHOUT PELVIS) 2-3V RIGHT COMPARISON:  04/03/2022. FINDINGS: Pelvis is intact with normal and symmetric sacroiliac joints. No acute fracture or dislocation. No aggressive osseous lesion. Visualized sacral arcuate lines are unremarkable. Unremarkable symphysis pubis. There are mild degenerative changes of the right hip joint without significant joint space narrowing. Osteophytosis of the superior acetabulum. Redemonstration of  left hemihip arthroplasty The hardware is intact. No periprosthetic fracture or lucency. No interval change in alignment, when compared to the available recent prior examination. No radiopaque foreign bodies. IMPRESSION: 1. Mild right hip degenerative joint disease. 2. Stable left hemihip arthroplasty. Electronically Signed   By: Jules Schick M.D.   On: 02/15/2023 18:44   DG Knee Complete 4 Views Right  Result Date: 02/15/2023 CLINICAL DATA:  pain EXAM: RIGHT KNEE - COMPLETE 4+ VIEW COMPARISON:  None Available. FINDINGS: There is diffuse osteopenia of the visualized osseous structures. No acute fracture or dislocation. No aggressive osseous lesion. There are degenerative changes of the knee joint in the form of mildly reduced medial tibio-femoral compartment joint space and osteophytosis. No knee effusion or focal soft tissue swelling. No radiopaque foreign bodies. IMPRESSION: 1. Mild degenerative change. No acute fracture or dislocation. Electronically Signed   By: Jules Schick M.D.   On: 02/15/2023 18:36    Procedures Procedures    Medications Ordered in ED Medications  oxyCODONE-acetaminophen (PERCOCET/ROXICET) 5-325 MG per tablet 1 tablet (has no administration in time range)  naproxen (NAPROSYN) tablet 500 mg (500 mg Oral Given 02/15/23 1708)  methocarbamol (ROBAXIN)  tablet 500 mg (500 mg Oral Given 02/15/23 1709)    ED Course/ Medical Decision Making/ A&P                                 Medical Decision Making Amount and/or Complexity of Data Reviewed Radiology: ordered.  Risk Prescription drug management.   This patient presents to the ED for concern of right knee and hip pain, this involves an extensive number of treatment options, and is a complaint that carries with it a high risk of complications and morbidity.  The differential diagnosis includes fracture, strain, sprain, contusion, dislocation, overuse injury.  My initial workup includes imaging, symptom control  Additional history obtained from: Nursing notes from this visit. EMS provides a portion of the history  I ordered imaging studies including x-ray right hip/pelvis and knee I independently visualized and interpreted imaging which showed degenerative changes, no acute abnormalities of the right knee or hip/pelvis I agree with the radiologist interpretation  Afebrile, hemodynamically stable.  62 year old female presenting to the ED for evaluation of atraumatic right knee and hip pain.  This pain began 3 days ago after she began performing physical therapy at her rehab facility.  Patient also has bilateral neuropathy.  She also has chronic low back pain.  She voices frustration with her nursing home and states that they are trying to kick her out and were withholding pain medications from her.  Pain is localized to the anterior of the knee and the lateral aspect of the right hip.  She appears well on physical exam.  No significant swelling of the joints.  No overlying erythema.  She is not febrile here or tachycardic.  I have very low suspicion for septic arthritis at this time.  Overall I suspect an overuse injury as she significantly increase her physical activity just prior to symptom onset.  She reported some improvement in her symptoms after treatment here in the emergency department.   Was requesting a prescription for Naprosyn.  No history of GI bleed that she is aware of for kidney disease.  Believe this is reasonable.  She is encouraged to follow-up with her primary care provider for reevaluation of her symptoms.  She was encouraged to slowly increase her activity so  as to not reaggravated her symptoms.  She was given return precautions. Stable at discharge.  At this time there does not appear to be any evidence of an acute emergency medical condition and the patient appears stable for discharge with appropriate outpatient follow up. Diagnosis was discussed with patient who verbalizes understanding of care plan and is agreeable to discharge. I have discussed return precautions with patient who verbalizes understanding. Patient encouraged to follow-up with their PCP within 1 week. All questions answered.  Patient's case discussed with Dr. Jearld Fenton who agrees with plan to discharge with follow-up.   Note: Portions of this report may have been transcribed using voice recognition software. Every effort was made to ensure accuracy; however, inadvertent computerized transcription errors may still be present.        Final Clinical Impression(s) / ED Diagnoses Final diagnoses:  Acute pain of right knee  Right hip pain  Overuse injury    Rx / DC Orders ED Discharge Orders          Ordered    naproxen (NAPROSYN) 375 MG tablet  2 times daily        02/15/23 1918              Michelle Piper, Cordelia Poche 02/15/23 1918    Loetta Rough, MD 02/21/23 [id number]

## 2023-02-15 NOTE — ED Triage Notes (Signed)
Pt arrived via [facility] from [facility] for c/o right hip pain that radiates to right knee that started yesterday after PT. Per EMS, there is right knee swelling. Pt was offered pain meds at nursing home, but refused and wanted to come here to get x-ray. The left knee has 2+ swelling. The right knee does not appear to be swollen.

## 2023-02-15 NOTE — Discharge Instructions (Signed)
You have been seen today for your complaint of right knee and hip pain. Your imaging was reassuring and showed evidence of mild arthritis. Your discharge medications include Naprosyn.  You may take this in addition to your pain medications at home. Home care instructions are as follows:  Slowly increase your activity so as to not reaggravate your symptoms Follow up with: Your primary care provider within the next week Please seek immediate medical care if you develop any of the following symptoms: You cannot move the joint. Your fingers or toes tingle, become numb, or turn cold and blue. You have a fever along with a joint that's red, warm, and swollen. At this time there does not appear to be the presence of an emergent medical condition, however there is always the potential for conditions to change. Please read and follow the below instructions.  Do not take your medicine if  develop an itchy rash, swelling in your mouth or lips, or difficulty breathing; call 911 and seek immediate emergency medical attention if this occurs.  You may review your lab tests and imaging results in their entirety on your MyChart account.  Please discuss all results of fully with your primary care provider and other specialist at your follow-up visit.  Note: Portions of this text may have been transcribed using voice recognition software. Every effort was made to ensure accuracy; however, inadvertent computerized transcription errors may still be present.

## 2023-02-15 NOTE — ED Notes (Signed)
[name] called, no eta 

## 2023-02-16 ENCOUNTER — Emergency Department (HOSPITAL_COMMUNITY)
Admission: EM | Admit: 2023-02-16 | Discharge: 2023-02-17 | Disposition: A | Discharge status: Home / Self Care | Payer: Medicare Other | Attending: Emergency Medicine | Admitting: Emergency Medicine

## 2023-02-16 ENCOUNTER — Other Ambulatory Visit: Payer: Self-pay

## 2023-02-16 ENCOUNTER — Encounter (HOSPITAL_COMMUNITY): Payer: Self-pay | Admitting: *Deleted

## 2023-02-16 DIAGNOSIS — G8929 Other chronic pain: Secondary | ICD-10-CM | POA: Diagnosis not present

## 2023-02-16 DIAGNOSIS — M25561 Pain in right knee: Secondary | ICD-10-CM | POA: Insufficient documentation

## 2023-02-16 DIAGNOSIS — I10 Essential (primary) hypertension: Secondary | ICD-10-CM | POA: Diagnosis not present

## 2023-02-16 NOTE — ED Triage Notes (Signed)
Pt arrived with [name] from [facility] for chronic knee pain. Recently seen for the same and give naproxen with improvement of pain. Naproxen not a scheduled medication tonight at facility. Pt given oxycodone 5mg  at 10pm. Denies new injury to knees. [name] VS 117/75, 80, 16, 94%

## 2023-02-17 ENCOUNTER — Encounter (INDEPENDENT_AMBULATORY_CARE_PROVIDER_SITE_OTHER): Payer: Self-pay

## 2023-02-17 DIAGNOSIS — M25561 Pain in right knee: Secondary | ICD-10-CM | POA: Diagnosis not present

## 2023-02-17 MED ORDER — NAPROXEN 250 MG PO TABS
500.0000 mg | ORAL_TABLET | Freq: Once | ORAL | Status: AC
  Administered 2023-02-17: 500 mg via ORAL
  Filled 2023-02-17: qty 2

## 2023-02-17 NOTE — Discharge Instructions (Signed)
Please give her naproxen BID as written on script from 02/15/23.

## 2023-02-17 NOTE — ED Notes (Signed)
Secretary set up transport with [name] for patient to return to [facility].

## 2023-02-17 NOTE — ED Provider Notes (Signed)
[place] EMERGENCY DEPARTMENT AT [facility] Provider Note   CSN: [id number] Arrival date & time: 02/16/23  2317     History  Chief Complaint  Patient presents with   Knee Pain    Rose Bridges is a 62 y.o. female.  The history is provided by the patient and medical records.  Knee Pain  78 female with history of hypertension, osteogenesis imperfecta, bipolar disorder, hyperlipidemia, personality disorder, presenting to the ED with right knee pain.  She was seen for same 02/15/2023 and discharged home with naproxen.  States after getting naproxen here she felt significantly better, she was discharged home with prescription but states "no one is there to input it into my chart so I never got anymore".  She was given her oxycodone but states that is not helping.  She requested to come back to the hospital tonight for dose of naproxen.  She denies any new falls/trauma/injuries.  No fevers.    States symptoms seemed to get worse 2 days ago when she tried to resume her PT.  Home Medications Prior to Admission medications   Medication Sig Start Date End Date Taking? Authorizing Provider  aspirin 81 MG chewable tablet Chew by mouth daily.    [provider]  Baclofen 5 MG TABS Take 1 tablet by mouth every 8 (eight) hours as needed for muscle spasms. 04/07/22   Celine Mans, MD  calcitonin, salmon, (MIACALCIN/FORTICAL) 200 UNIT/ACT nasal spray Place 1 spray into alternate nostrils daily. 04/08/22   Celine Mans, MD  calcium carbonate (TUMS - DOSED IN MG ELEMENTAL CALCIUM) 500 MG chewable tablet Chew 1 tablet by mouth 2 (two) times daily.    [provider]  cephALEXin (KEFLEX) 500 MG capsule Take 1 capsule (500 mg total) by mouth 4 (four) times daily. 11/13/22   Mannie Stabile, PA-C  docusate sodium (COLACE) 100 MG capsule Take 100 mg by mouth 2 (two) times daily.    [provider]  FLUoxetine (PROZAC) 10 MG tablet Take 10 mg by mouth  daily.    [provider]  folic acid (FOLVITE) 1 MG tablet Take 1 tablet (1 mg total) by mouth daily. 09/09/19   Storm Frisk, MD  gabapentin (NEURONTIN) 100 MG capsule Take 3 capsules (300 mg total) by mouth 3 (three) times daily. 02/12/23   Armbruster, Willaim Rayas, MD  hydrOXYzine (ATARAX) 10 MG tablet Take 1 tablet (10 mg total) by mouth 3 (three) times daily as needed for anxiety. 05/25/22   Noralee Stain, DO  levETIRAcetam (KEPPRA) 500 MG tablet Take 1 tablet (500 mg total) by mouth 2 (two) times daily. 05/18/22   Leatha Gilding, MD  magnesium gluconate (MAGONATE) 500 MG tablet Take 500 mg by mouth 2 (two) times daily.    [provider]  naproxen (NAPROSYN) 375 MG tablet Take 1 tablet (375 mg total) by mouth 2 (two) times daily for 15 days. 02/15/23 03/02/23  Schutt, Edsel Petrin, PA-C  oxyCODONE (ROXICODONE) 5 MG immediate release tablet Take 1 tablet (5 mg total) by mouth every 6 (six) hours. Patient taking differently: Take 5 mg by mouth See admin instructions. Every 10 hours 05/25/22   Noralee Stain, DO  oxyCODONE (ROXICODONE) 5 MG immediate release tablet Take 1 tablet (5 mg total) by mouth every 4 (four) hours as needed for severe pain or breakthrough pain. Can take tablet for breakthrough on top of her scheduled oxycodone 05/25/22   Noralee Stain, DO  pantoprazole (PROTONIX) 20 MG tablet Take  2 tablets (40 mg total) by mouth daily. 01/29/23   Armbruster, Willaim Rayas, MD  polyethylene glycol (MIRALAX / GLYCOLAX) 17 g packet Take 17 g by mouth daily.    [provider]  senna (SENOKOT) 8.6 MG TABS tablet Take 1 tablet by mouth.    [provider]  thiamine 100 MG tablet Take 1 tablet (100 mg total) by mouth daily. 09/09/19   Storm Frisk, MD  Vitamin D, Ergocalciferol, (DRISDOL) 1.25 MG (50000 UNIT) CAPS capsule Take 1 capsule (50,000 Units total) by mouth every 7 (seven) days. Patient taking differently: Take 50,000 Units by mouth every 7 (seven) days.  Thursdays 04/12/22   Celine Mans, MD      Allergies    Codeine    Review of Systems   Review of Systems  Musculoskeletal:  Positive for arthralgias.  All other systems reviewed and are negative.   Physical Exam Updated Vital Signs BP 117/75   Pulse 78   Temp 99 F (37.2 C) (Oral)   Resp 18   SpO2 95%   Physical Exam Vitals and nursing note reviewed.  Constitutional:      Appearance: She is well-developed.  HENT:     Head: Normocephalic and atraumatic.  Eyes:     Conjunctiva/sclera: Conjunctivae normal.     Pupils: Pupils are equal, round, and reactive to light.  Cardiovascular:     Rate and Rhythm: Normal rate and regular rhythm.     Heart sounds: Normal heart sounds.  Pulmonary:     Effort: Pulmonary effort is normal.     Breath sounds: Normal breath sounds.  Abdominal:     General: Bowel sounds are normal.     Palpations: Abdomen is soft.  Musculoskeletal:        General: Normal range of motion.     Cervical back: Normal range of motion.     Comments: Right knee is atraumatic, no significant swelling, no bony deformities, mild tenderness along medial joint line, able to flex/extend independently, leg is NVI distally  Skin:    General: Skin is warm and dry.  Neurological:     Mental Status: She is alert and oriented to person, place, and time.     ED Results / Procedures / Treatments   Labs (all labs ordered are listed, but only abnormal results are displayed) Labs Reviewed - No data to display  EKG None  Radiology DG Hip Unilat W or Wo Pelvis 2-3 Views Right  Result Date: 02/15/2023 CLINICAL DATA:  Right hip pain. EXAM: DG HIP (WITH OR WITHOUT PELVIS) 2-3V RIGHT COMPARISON:  04/03/2022. FINDINGS: Pelvis is intact with normal and symmetric sacroiliac joints. No acute fracture or dislocation. No aggressive osseous lesion. Visualized sacral arcuate lines are unremarkable. Unremarkable symphysis pubis. There are mild degenerative changes of the right  hip joint without significant joint space narrowing. Osteophytosis of the superior acetabulum. Redemonstration of left hemihip arthroplasty The hardware is intact. No periprosthetic fracture or lucency. No interval change in alignment, when compared to the available recent prior examination. No radiopaque foreign bodies. IMPRESSION: 1. Mild right hip degenerative joint disease. 2. Stable left hemihip arthroplasty. Electronically Signed   By: Jules Schick M.D.   On: 02/15/2023 18:44   DG Knee Complete 4 Views Right  Result Date: 02/15/2023 CLINICAL DATA:  pain EXAM: RIGHT KNEE - COMPLETE 4+ VIEW COMPARISON:  None Available. FINDINGS: There is diffuse osteopenia of the visualized osseous structures. No acute fracture or dislocation. No aggressive osseous lesion. There  are degenerative changes of the knee joint in the form of mildly reduced medial tibio-femoral compartment joint space and osteophytosis. No knee effusion or focal soft tissue swelling. No radiopaque foreign bodies. IMPRESSION: 1. Mild degenerative change. No acute fracture or dislocation. Electronically Signed   By: Jules Schick M.D.   On: 02/15/2023 18:36    Procedures Procedures    Medications Ordered in ED Medications  naproxen (NAPROSYN) tablet 500 mg (has no administration in time range)    ED Course/ Medical Decision Making/ A&P                                 Medical Decision Making Risk Prescription drug management.   62 year old female presenting to the ED with chronic right knee pain.  Seen here 2 days ago for same.  Symptoms seem to have worsened a few days ago after attempting to restart her PT at rehab facility.  Had x-rays done 10/18 which I have reviewed-- no acute findings.  She denies new falls/trauma so do not feel these need to be repeated today.  She is requesting her naproxen as this helps a great deal with her pain, however they are not giving this to her at facility despite receiving script from prior  ED visit.  I have given her a dose here, have written instructions for facility to give this as directed.   She can follow-up with her PCP.  Return here for new concerns.  Final Clinical Impression(s) / ED Diagnoses Final diagnoses:  Chronic pain of right knee    Rx / DC Orders ED Discharge Orders     None         Garlon Hatchet, PA-C 02/17/23 0431    Nira Conn, MD 02/18/23 [phone]

## 2023-02-20 ENCOUNTER — Encounter (HOSPITAL_COMMUNITY): Payer: Self-pay

## 2023-02-20 ENCOUNTER — Other Ambulatory Visit: Payer: Self-pay

## 2023-02-20 ENCOUNTER — Emergency Department (HOSPITAL_COMMUNITY)
Admission: EM | Admit: 2023-02-20 | Discharge: 2023-02-20 | Disposition: A | Discharge status: Home / Self Care | Payer: Medicare Other | Attending: Emergency Medicine | Admitting: Emergency Medicine

## 2023-02-20 ENCOUNTER — Emergency Department (HOSPITAL_COMMUNITY): Payer: Medicare Other

## 2023-02-20 DIAGNOSIS — Z7982 Long term (current) use of aspirin: Secondary | ICD-10-CM | POA: Insufficient documentation

## 2023-02-20 DIAGNOSIS — M25561 Pain in right knee: Secondary | ICD-10-CM | POA: Diagnosis present

## 2023-02-20 DIAGNOSIS — Z79899 Other long term (current) drug therapy: Secondary | ICD-10-CM | POA: Diagnosis not present

## 2023-02-20 MED ORDER — METHOCARBAMOL 500 MG PO TABS: 500.0000 mg | ORAL_TABLET | Freq: Two times a day (BID) | ORAL | 0 refills | Status: DC

## 2023-02-20 MED ORDER — METHOCARBAMOL 500 MG PO TABS
500.0000 mg | ORAL_TABLET | Freq: Once | ORAL | Status: AC
  Administered 2023-02-20: 500 mg via ORAL
  Filled 2023-02-20: qty 1

## 2023-02-20 NOTE — Discharge Instructions (Addendum)
Please follow-up with orthopedics as needed for further evaluation of your right knee pain. Take the robaxin as prescribed

## 2023-02-20 NOTE — ED Provider Notes (Signed)
[place] EMERGENCY DEPARTMENT AT [facility] Provider Note   CSN: [id number] Arrival date & time: 02/20/23  0106     History  Chief Complaint  Patient presents with   Knee Pain    Rose Bridges is a 62 y.o. female.  Patient with past medical history significant for osteogenesis imperfecta presents to the emergency department via EMS complaining of right-sided knee pain.  She rates her pain as 10 out of 10 in severity.  Patient has frequent PT episodes due to the osteogenesis imperfecta and states that she was unable to do it today due to pain.  She took her scheduled oxycodone with little relief.  She states she has been seen here twice over the past week for the same and has been given Robaxin which was hugely beneficial.  She has not been provided a prescription for Robaxin in the past.  She is here tonight requesting Robaxin and a prescription for the same.  She denies any falls or new injuries to the area.  Denies fever, nausea, vomiting.   Knee Pain      Home Medications Prior to Admission medications   Medication Sig Start Date End Date Taking? Authorizing Provider  methocarbamol (ROBAXIN) 500 MG tablet Take 1 tablet (500 mg total) by mouth 2 (two) times daily. 02/20/23  Yes Darrick Grinder, PA-C  aspirin 81 MG chewable tablet Chew by mouth daily.    [provider]  Baclofen 5 MG TABS Take 1 tablet by mouth every 8 (eight) hours as needed for muscle spasms. 04/07/22   Celine Mans, MD  calcitonin, salmon, (MIACALCIN/FORTICAL) 200 UNIT/ACT nasal spray Place 1 spray into alternate nostrils daily. 04/08/22   Celine Mans, MD  calcium carbonate (TUMS - DOSED IN MG ELEMENTAL CALCIUM) 500 MG chewable tablet Chew 1 tablet by mouth 2 (two) times daily.    [provider]  cephALEXin (KEFLEX) 500 MG capsule Take 1 capsule (500 mg total) by mouth 4 (four) times daily. 11/13/22   Mannie Stabile, PA-C  docusate sodium (COLACE) 100 MG capsule  Take 100 mg by mouth 2 (two) times daily.    [provider]  FLUoxetine (PROZAC) 10 MG tablet Take 10 mg by mouth daily.    [provider]  folic acid (FOLVITE) 1 MG tablet Take 1 tablet (1 mg total) by mouth daily. 09/09/19   Storm Frisk, MD  gabapentin (NEURONTIN) 100 MG capsule Take 3 capsules (300 mg total) by mouth 3 (three) times daily. 02/12/23   Armbruster, Willaim Rayas, MD  hydrOXYzine (ATARAX) 10 MG tablet Take 1 tablet (10 mg total) by mouth 3 (three) times daily as needed for anxiety. 05/25/22   Noralee Stain, DO  levETIRAcetam (KEPPRA) 500 MG tablet Take 1 tablet (500 mg total) by mouth 2 (two) times daily. 05/18/22   Leatha Gilding, MD  magnesium gluconate (MAGONATE) 500 MG tablet Take 500 mg by mouth 2 (two) times daily.    [provider]  naproxen (NAPROSYN) 375 MG tablet Take 1 tablet (375 mg total) by mouth 2 (two) times daily for 15 days. 02/15/23 03/02/23  Schutt, Edsel Petrin, PA-C  oxyCODONE (ROXICODONE) 5 MG immediate release tablet Take 1 tablet (5 mg total) by mouth every 6 (six) hours. Patient taking differently: Take 5 mg by mouth See admin instructions. Every 10 hours 05/25/22   Noralee Stain, DO  oxyCODONE (ROXICODONE) 5 MG immediate release tablet Take 1 tablet (5 mg total) by mouth every 4 (four) hours  as needed for severe pain or breakthrough pain. Can take tablet for breakthrough on top of her scheduled oxycodone 05/25/22   Noralee Stain, DO  pantoprazole (PROTONIX) 20 MG tablet Take 2 tablets (40 mg total) by mouth daily. 01/29/23   Armbruster, Willaim Rayas, MD  polyethylene glycol (MIRALAX / GLYCOLAX) 17 g packet Take 17 g by mouth daily.    [provider]  senna (SENOKOT) 8.6 MG TABS tablet Take 1 tablet by mouth.    [provider]  thiamine 100 MG tablet Take 1 tablet (100 mg total) by mouth daily. 09/09/19   Storm Frisk, MD  Vitamin D, Ergocalciferol, (DRISDOL) 1.25 MG (50000 UNIT) CAPS capsule Take 1 capsule  (50,000 Units total) by mouth every 7 (seven) days. Patient taking differently: Take 50,000 Units by mouth every 7 (seven) days. Thursdays 04/12/22   Celine Mans, MD      Allergies    Codeine    Review of Systems   Review of Systems  Physical Exam Updated Vital Signs BP 115/63   Pulse 67   Temp 97.9 F (36.6 C) (Oral)   Resp 17   SpO2 97%  Physical Exam Vitals and nursing note reviewed.  Constitutional:      Appearance: She is well-developed.  HENT:     Head: Normocephalic and atraumatic.  Eyes:     Conjunctiva/sclera: Conjunctivae normal.     Pupils: Pupils are equal, round, and reactive to light.  Cardiovascular:     Rate and Rhythm: Normal rate and regular rhythm.     Heart sounds: Normal heart sounds.  Pulmonary:     Effort: Pulmonary effort is normal.     Breath sounds: Normal breath sounds.  Abdominal:     General: Bowel sounds are normal.     Palpations: Abdomen is soft.  Musculoskeletal:        General: Normal range of motion.     Cervical back: Normal range of motion.     Comments: Right knee is atraumatic, no significant swelling, no bony deformities, mild tenderness along medial joint line, able to flex/extend independently, leg is NVI distally  Skin:    General: Skin is warm and dry.  Neurological:     Mental Status: She is alert and oriented to person, place, and time.     ED Results / Procedures / Treatments   Labs (all labs ordered are listed, but only abnormal results are displayed) Labs Reviewed - No data to display  EKG None  Radiology No results found.  Procedures Procedures    Medications Ordered in ED Medications  methocarbamol (ROBAXIN) tablet 500 mg (500 mg Oral Given 02/20/23 0300)    ED Course/ Medical Decision Making/ A&P                                 Medical Decision Making Amount and/or Complexity of Data Reviewed Radiology: ordered.  Risk Prescription drug management.   This patient presents to the ED  for concern of knee pain, this involves an extensive number of treatment options, and is a complaint that carries with it a high risk of complications and morbidity.  The differential diagnosis includes fracture, dislocation, soft tissue injury   Co morbidities that complicate the patient evaluation  Osteogenesis imperfecta   Imaging Studies ordered:  I ordered imaging studies including plain films of the right knee I independently visualized and interpreted imaging which showed no acute findings  by my interpretation    Problem List / ED Course / Critical interventions / Medication management   I ordered medication including Robaxin for muscle spasm Reevaluation of the patient after these medicines showed that the patient improved I have reviewed the patients home medicines and have made adjustments as needed   Test / Admission - Considered:  Plain films of the right knee appear unremarkable.  Patient with no new injury or trauma to the area.  No signs of septic arthritis.  No signs of tendinous injury.  Plan to discharge home at this time with prescription for Robaxin and recommendations for follow-up with orthopedics.         Final Clinical Impression(s) / ED Diagnoses Final diagnoses:  Acute pain of right knee    Rx / DC Orders ED Discharge Orders          Ordered    methocarbamol (ROBAXIN) 500 MG tablet  2 times daily        02/20/23 0430              Pamala Duffel 02/20/23 0454    Tilden Fossa, MD 02/20/23 [phone]

## 2023-02-20 NOTE — ED Notes (Signed)
Pt covered in urine and stool. Pt clean and changed into

## 2023-02-20 NOTE — ED Notes (Signed)
Pt yelling in hallway "MY PELVIS HURTS! I NEED MY BED UP HIGHER!" This RN raised the head of bed, but pt refused and stated she wants the whole bed raised to relieve her pelvis pain. This RN explained that raising the whole bed will not change her pelvis position. Pt refused to listen to the RN and yelled the same thing repetitiously even louder.

## 2023-02-20 NOTE — ED Notes (Signed)
Transfer of care given to [place], Charity fundraiser.

## 2023-02-20 NOTE — ED Triage Notes (Signed)
Pt arrived from [facility] with right knee pain 10/10 on pain scale. Pt had pt today, took her regular pain medication at the facility that did not help the pain.

## 2023-02-20 NOTE — ED Notes (Signed)
[name] called for transport.  

## 2023-02-25 ENCOUNTER — Other Ambulatory Visit: Payer: Self-pay

## 2023-02-25 ENCOUNTER — Emergency Department (HOSPITAL_COMMUNITY)
Admission: EM | Admit: 2023-02-25 | Discharge: 2023-02-26 | Disposition: A | Discharge status: Home / Self Care | Payer: Medicare Other | Attending: Emergency Medicine | Admitting: Emergency Medicine

## 2023-02-25 ENCOUNTER — Encounter (HOSPITAL_COMMUNITY): Payer: Self-pay | Admitting: Emergency Medicine

## 2023-02-25 DIAGNOSIS — M25551 Pain in right hip: Secondary | ICD-10-CM | POA: Diagnosis not present

## 2023-02-25 DIAGNOSIS — Z7982 Long term (current) use of aspirin: Secondary | ICD-10-CM | POA: Insufficient documentation

## 2023-02-25 DIAGNOSIS — G8929 Other chronic pain: Secondary | ICD-10-CM | POA: Insufficient documentation

## 2023-02-25 DIAGNOSIS — M25561 Pain in right knee: Secondary | ICD-10-CM | POA: Insufficient documentation

## 2023-02-25 MED ORDER — METHOCARBAMOL 500 MG PO TABS
500.0000 mg | ORAL_TABLET | Freq: Once | ORAL | Status: AC
  Administered 2023-02-25: 500 mg via ORAL
  Filled 2023-02-25: qty 1

## 2023-02-25 NOTE — ED Triage Notes (Addendum)
Patient BIB EMS for evaluation of R hip pain.  Reports pain worsened after PT today.  Currently resides at [facility].  Has been seen multiple times within the last 10 days for same complaints.  No reports of new fall or injury.  Requesting a muscle relaxer.  Last dose of oxycodone was around 1630.  Pt concerned that staff is not medication her with correct medications and at correct times.  Patient reports she is having pain to R knee.  Pain is chronic in nature.

## 2023-02-25 NOTE — Discharge Instructions (Addendum)
You were seen in the ER today for knee pain. This is chronic in nature but you were given Robaxin which helped to alleviate some of your symptoms. Please follow up with your primary care provider as needed.  Your chart shows that you previous saw Dr. Margarita Rana for orthopedics.

## 2023-02-25 NOTE — ED Notes (Signed)
Patient resting quietly with eyes closed.  Respirations even and unlabored.  °

## 2023-02-26 NOTE — ED Notes (Signed)
Report called and given to [facility] staff member

## 2023-02-28 ENCOUNTER — Emergency Department (HOSPITAL_COMMUNITY)
Admission: EM | Admit: 2023-02-28 | Discharge: 2023-02-28 | Disposition: A | Discharge status: Home / Self Care | Payer: Medicare Other | Attending: Emergency Medicine | Admitting: Emergency Medicine

## 2023-02-28 ENCOUNTER — Encounter (HOSPITAL_COMMUNITY): Payer: Self-pay

## 2023-02-28 DIAGNOSIS — M62838 Other muscle spasm: Secondary | ICD-10-CM | POA: Insufficient documentation

## 2023-02-28 DIAGNOSIS — Z7982 Long term (current) use of aspirin: Secondary | ICD-10-CM | POA: Insufficient documentation

## 2023-02-28 MED ORDER — METHOCARBAMOL 500 MG PO TABS
500.0000 mg | ORAL_TABLET | Freq: Once | ORAL | Status: AC
  Administered 2023-02-28: 500 mg via ORAL
  Filled 2023-02-28: qty 1

## 2023-02-28 NOTE — ED Provider Notes (Signed)
[place] EMERGENCY DEPARTMENT AT [facility] Provider Note   CSN: [id number] Arrival date & time: 02/28/23  0416     History  Chief Complaint  Patient presents with   Spasms    Rose Bridges is a 62 y.o. female.  Patient with past medical history significant for osteogenesis imperfecta presents to the emergency department via EMS complaining of ongoing muscle spasms.  This is her fifth visit over the past 2 weeks for similar complaints.  Patient believes that her facility is not dosing her muscle relaxants as directed.  Chart review shows treatment with muscle relaxants, gabapentin, oxycodone, Naprosyn.  Patient denies any trauma or injury.  She states she has tried to speak to the physician at her facility but has been unable to get muscle relaxants as "needed".  HPI     Home Medications Prior to Admission medications   Medication Sig Start Date End Date Taking? Authorizing Provider  aspirin 81 MG chewable tablet Chew by mouth daily.    [provider]  Baclofen 5 MG TABS Take 1 tablet by mouth every 8 (eight) hours as needed for muscle spasms. 04/07/22   Celine Mans, MD  calcitonin, salmon, (MIACALCIN/FORTICAL) 200 UNIT/ACT nasal spray Place 1 spray into alternate nostrils daily. 04/08/22   Celine Mans, MD  calcium carbonate (TUMS - DOSED IN MG ELEMENTAL CALCIUM) 500 MG chewable tablet Chew 1 tablet by mouth 2 (two) times daily.    [provider]  cephALEXin (KEFLEX) 500 MG capsule Take 1 capsule (500 mg total) by mouth 4 (four) times daily. 11/13/22   Mannie Stabile, PA-C  docusate sodium (COLACE) 100 MG capsule Take 100 mg by mouth 2 (two) times daily.    [provider]  FLUoxetine (PROZAC) 10 MG tablet Take 10 mg by mouth daily.    [provider]  folic acid (FOLVITE) 1 MG tablet Take 1 tablet (1 mg total) by mouth daily. 09/09/19   Storm Frisk, MD  gabapentin (NEURONTIN) 100 MG capsule Take 3 capsules (300  mg total) by mouth 3 (three) times daily. 02/12/23   Armbruster, Willaim Rayas, MD  hydrOXYzine (ATARAX) 10 MG tablet Take 1 tablet (10 mg total) by mouth 3 (three) times daily as needed for anxiety. 05/25/22   Noralee Stain, DO  levETIRAcetam (KEPPRA) 500 MG tablet Take 1 tablet (500 mg total) by mouth 2 (two) times daily. 05/18/22   Leatha Gilding, MD  magnesium gluconate (MAGONATE) 500 MG tablet Take 500 mg by mouth 2 (two) times daily.    [provider]  methocarbamol (ROBAXIN) 500 MG tablet Take 1 tablet (500 mg total) by mouth 2 (two) times daily. 02/20/23   Darrick Grinder, PA-C  naproxen (NAPROSYN) 375 MG tablet Take 1 tablet (375 mg total) by mouth 2 (two) times daily for 15 days. 02/15/23 03/02/23  Schutt, Edsel Petrin, PA-C  oxyCODONE (ROXICODONE) 5 MG immediate release tablet Take 1 tablet (5 mg total) by mouth every 6 (six) hours. Patient taking differently: Take 5 mg by mouth See admin instructions. Every 10 hours 05/25/22   Noralee Stain, DO  oxyCODONE (ROXICODONE) 5 MG immediate release tablet Take 1 tablet (5 mg total) by mouth every 4 (four) hours as needed for severe pain or breakthrough pain. Can take tablet for breakthrough on top of her scheduled oxycodone 05/25/22   Noralee Stain, DO  pantoprazole (PROTONIX) 20 MG tablet Take 2 tablets (40 mg total) by mouth daily. 01/29/23   Armbruster, Willaim Rayas,  MD  polyethylene glycol (MIRALAX / GLYCOLAX) 17 g packet Take 17 g by mouth daily.    [provider]  senna (SENOKOT) 8.6 MG TABS tablet Take 1 tablet by mouth.    [provider]  thiamine 100 MG tablet Take 1 tablet (100 mg total) by mouth daily. 09/09/19   Storm Frisk, MD  Vitamin D, Ergocalciferol, (DRISDOL) 1.25 MG (50000 UNIT) CAPS capsule Take 1 capsule (50,000 Units total) by mouth every 7 (seven) days. Patient taking differently: Take 50,000 Units by mouth every 7 (seven) days. Thursdays 04/12/22   Celine Mans, MD      Allergies    Codeine     Review of Systems   Review of Systems  Physical Exam Updated Vital Signs BP 133/65 (BP Location: Right Arm)   Pulse 75   Temp 98.5 F (36.9 C) (Oral)   Resp 20   Ht 5\' 4"  (1.626 m)   Wt 58.9 kg   SpO2 94%   BMI 22.29 kg/m  Physical Exam Vitals and nursing note reviewed.  Constitutional:      Appearance: She is well-developed.  HENT:     Head: Normocephalic and atraumatic.  Eyes:     Conjunctiva/sclera: Conjunctivae normal.  Cardiovascular:     Rate and Rhythm: Normal rate.  Pulmonary:     Effort: Pulmonary effort is normal.  Musculoskeletal:        General: Normal range of motion.     Cervical back: Normal range of motion.     Comments: Right knee is atraumatic, no significant swelling, no bony deformities, mild tenderness along medial joint line, able to flex/extend independently, leg is NVI distally  Skin:    General: Skin is warm and dry.  Neurological:     Mental Status: She is alert and oriented to person, place, and time.     ED Results / Procedures / Treatments   Labs (all labs ordered are listed, but only abnormal results are displayed) Labs Reviewed - No data to display  EKG None  Radiology No results found.  Procedures Procedures    Medications Ordered in ED Medications  methocarbamol (ROBAXIN) tablet 500 mg (500 mg Oral Given 02/28/23 0451)    ED Course/ Medical Decision Making/ A&P                                 Medical Decision Making Risk Prescription drug management.   Patient with osteogenesis imperfecta complaining of ongoing muscle spasms in the lower legs.  Patient's chief complaint is the right leg.  She states that after PT she has the spasms.  She is concerned due to her medication regimen at her facility that she is not getting appropriate medications.  Chart review shows that she is on multimodal pain control at this time.  I do not see any indication to change her medications at this time.  The patient was  administered 1 dose of Robaxin here in the emergency department with relief in symptoms.  No indication for repeat imaging as there was no trauma, no deformity.  Pain consistent with previous recent visits which have included imaging.  Discussed with patient need for further discussion with the provider at her facility.  She has been provided recently with prescription for Robaxin.  I do not see need for further emergent workup or for changes in her current medication regimen at this time.  Final Clinical Impression(s) / ED Diagnoses Final diagnoses:  Muscle spasm    Rx / DC Orders ED Discharge Orders     None         Pamala Duffel 02/28/23 1610    Palumbo, April, MD 02/28/23 0530

## 2023-02-28 NOTE — Discharge Instructions (Signed)
Please discuss with your primary team your recurrent leg pain. If you develop life threatening symptoms return to the emergency department.

## 2023-02-28 NOTE — ED Triage Notes (Signed)
Patient C/O the [facility] is not dosing her correctly for muscle/bilateral leg pain. Requesting muscle relaxer

## 2023-03-01 NOTE — ED Provider Notes (Signed)
[place] EMERGENCY DEPARTMENT AT [facility] Provider Note   CSN: [id number] Arrival date & time: 02/25/23  2124     History Chief Complaint  Patient presents with   Hip Pain   Knee Pain    Rose Bridges is a 62 y.o. female.  Patient presents the emergency department for evaluation of right hip pain.  History of oxygen assessment infected.  Currently resident at [facility] was seen here in emergency department for similar concerns few days ago.  Denies any recent fall or injury but states that she typically has worsening pain after physical therapy sessions.  Currently endorsing right knee pain however this is chronic in nature.  Was recently discharged with a prescription for Robaxin but states that this has not been given to her at her facility.   Hip Pain  Knee Pain      Home Medications Prior to Admission medications   Medication Sig Start Date End Date Taking? Authorizing Provider  aspirin 81 MG chewable tablet Chew by mouth daily.    [provider]  Baclofen 5 MG TABS Take 1 tablet by mouth every 8 (eight) hours as needed for muscle spasms. 04/07/22   Celine Mans, MD  calcitonin, salmon, (MIACALCIN/FORTICAL) 200 UNIT/ACT nasal spray Place 1 spray into alternate nostrils daily. 04/08/22   Celine Mans, MD  calcium carbonate (TUMS - DOSED IN MG ELEMENTAL CALCIUM) 500 MG chewable tablet Chew 1 tablet by mouth 2 (two) times daily.    [provider]  cephALEXin (KEFLEX) 500 MG capsule Take 1 capsule (500 mg total) by mouth 4 (four) times daily. 11/13/22   Mannie Stabile, PA-C  docusate sodium (COLACE) 100 MG capsule Take 100 mg by mouth 2 (two) times daily.    [provider]  FLUoxetine (PROZAC) 10 MG tablet Take 10 mg by mouth daily.    [provider]  folic acid (FOLVITE) 1 MG tablet Take 1 tablet (1 mg total) by mouth daily. 09/09/19   Storm Frisk, MD  gabapentin (NEURONTIN) 100 MG capsule Take 3  capsules (300 mg total) by mouth 3 (three) times daily. 02/12/23   Armbruster, Willaim Rayas, MD  hydrOXYzine (ATARAX) 10 MG tablet Take 1 tablet (10 mg total) by mouth 3 (three) times daily as needed for anxiety. 05/25/22   Noralee Stain, DO  levETIRAcetam (KEPPRA) 500 MG tablet Take 1 tablet (500 mg total) by mouth 2 (two) times daily. 05/18/22   Leatha Gilding, MD  magnesium gluconate (MAGONATE) 500 MG tablet Take 500 mg by mouth 2 (two) times daily.    [provider]  methocarbamol (ROBAXIN) 500 MG tablet Take 1 tablet (500 mg total) by mouth 2 (two) times daily. 02/20/23   Darrick Grinder, PA-C  naproxen (NAPROSYN) 375 MG tablet Take 1 tablet (375 mg total) by mouth 2 (two) times daily for 15 days. 02/15/23 03/02/23  Schutt, Edsel Petrin, PA-C  oxyCODONE (ROXICODONE) 5 MG immediate release tablet Take 1 tablet (5 mg total) by mouth every 6 (six) hours. Patient taking differently: Take 5 mg by mouth See admin instructions. Every 10 hours 05/25/22   Noralee Stain, DO  oxyCODONE (ROXICODONE) 5 MG immediate release tablet Take 1 tablet (5 mg total) by mouth every 4 (four) hours as needed for severe pain or breakthrough pain. Can take tablet for breakthrough on top of her scheduled oxycodone 05/25/22   Noralee Stain, DO  pantoprazole (PROTONIX) 20 MG tablet Take 2 tablets (40 mg total) by mouth  daily. 01/29/23   Armbruster, Willaim Rayas, MD  polyethylene glycol (MIRALAX / GLYCOLAX) 17 g packet Take 17 g by mouth daily.    [provider]  senna (SENOKOT) 8.6 MG TABS tablet Take 1 tablet by mouth.    [provider]  thiamine 100 MG tablet Take 1 tablet (100 mg total) by mouth daily. 09/09/19   Storm Frisk, MD  Vitamin D, Ergocalciferol, (DRISDOL) 1.25 MG (50000 UNIT) CAPS capsule Take 1 capsule (50,000 Units total) by mouth every 7 (seven) days. Patient taking differently: Take 50,000 Units by mouth every 7 (seven) days. Thursdays 04/12/22   Celine Mans, MD       Allergies    Codeine    Review of Systems   Review of Systems  Musculoskeletal:        Knee pain  All other systems reviewed and are negative.   Physical Exam Updated Vital Signs BP 110/66   Pulse 65   Temp 97.8 F (36.6 C) (Oral)   Resp 16   Ht 5\' 4"  (1.626 m)   Wt 59 kg   SpO2 95%   BMI 22.31 kg/m  Physical Exam Vitals and nursing note reviewed.  Constitutional:      General: She is not in acute distress.    Appearance: She is well-developed.  HENT:     Head: Normocephalic and atraumatic.  Eyes:     Conjunctiva/sclera: Conjunctivae normal.  Cardiovascular:     Rate and Rhythm: Normal rate and regular rhythm.     Heart sounds: No murmur heard. Pulmonary:     Effort: Pulmonary effort is normal. No respiratory distress.     Breath sounds: Normal breath sounds.  Abdominal:     Palpations: Abdomen is soft.     Tenderness: There is no abdominal tenderness.  Musculoskeletal:        General: Tenderness present. No swelling, deformity or signs of injury. Normal range of motion.     Cervical back: Neck supple.     Comments: Right knee TTP  Skin:    General: Skin is warm and dry.     Capillary Refill: Capillary refill takes less than 2 seconds.  Neurological:     Mental Status: She is alert.  Psychiatric:        Mood and Affect: Mood normal.     ED Results / Procedures / Treatments   Labs (all labs ordered are listed, but only abnormal results are displayed) Labs Reviewed - No data to display  EKG None  Radiology No results found.  Procedures Procedures   Medications Ordered in ED Medications  methocarbamol (ROBAXIN) tablet 500 mg (500 mg Oral Given 02/25/23 2226)    ED Course/ Medical Decision Making/ A&P                               Medical Decision Making Risk Prescription drug management.   This patient presents to the ED for concern of knee pain.  Differential diagnosis includes chronic knee pain, osteoarthritis, patellar dislocation,  femur fracture   Medicines ordered and prescription drug management:  I ordered medication including Robaxin for pain Reevaluation of the patient after these medicines showed that the patient improved I have reviewed the patients home medicines and have made adjustments as needed   Problem List / ED Course:  Patient here with concerns of knee pain/hip pain.  Was seen a few days ago for similar concerns and states  that she typically has worsening pain after physical therapy sessions.  Had a physical therapy session today and now having worsening pain.  States that the pain is typically severe due to her history of osteogenesis imperfecta.  Significant days ago and given a prescription for Robaxin and states that her facility has not given her this medication.  Denies any other acute or new concerns.  No recent injury or falls.  Given lack of any specific mechanisms of injury and patient able to bear weight, do not feel there is any indication for repeat imaging at this time. Dose of Robaxin given to patient. On reassessment, patient reports significant improvement in pain.  Feels stable and comfortable for discharge back to care facility.  Patient discharged to care facility in stable condition.  Final Clinical Impression(s) / ED Diagnoses Final diagnoses:  Chronic pain of right knee    Rx / DC Orders ED Discharge Orders     None         Smitty Knudsen, PA-C 03/01/23 1356    Royanne Foots, DO 03/07/23 0007

## 2023-03-02 ENCOUNTER — Other Ambulatory Visit: Payer: Self-pay

## 2023-03-02 ENCOUNTER — Encounter (HOSPITAL_COMMUNITY): Payer: Self-pay | Admitting: *Deleted

## 2023-03-02 ENCOUNTER — Emergency Department (HOSPITAL_COMMUNITY)
Admission: EM | Admit: 2023-03-02 | Discharge: 2023-03-02 | Disposition: A | Discharge status: Home / Self Care | Payer: Medicare Other | Attending: Emergency Medicine | Admitting: Emergency Medicine

## 2023-03-02 DIAGNOSIS — G8929 Other chronic pain: Secondary | ICD-10-CM | POA: Diagnosis not present

## 2023-03-02 DIAGNOSIS — M25561 Pain in right knee: Secondary | ICD-10-CM | POA: Diagnosis present

## 2023-03-02 MED ORDER — CYCLOBENZAPRINE HCL 10 MG PO TABS
10.0000 mg | ORAL_TABLET | Freq: Two times a day (BID) | ORAL | 0 refills | Status: DC | PRN
  Filled 2023-03-02: qty 20, 10d supply, fill #0

## 2023-03-02 MED ORDER — CYCLOBENZAPRINE HCL 10 MG PO TABS
10.0000 mg | ORAL_TABLET | Freq: Once | ORAL | Status: AC
  Administered 2023-03-02: 10 mg via ORAL
  Filled 2023-03-02: qty 1

## 2023-03-02 NOTE — ED Notes (Signed)
CALLED [name] EARLY TO PLACE HER IN LINE.

## 2023-03-02 NOTE — ED Provider Notes (Signed)
[place] EMERGENCY DEPARTMENT AT [facility] Provider Note   CSN: [id number] Arrival date & time: 03/02/23  1403     History  Chief Complaint  Patient presents with   Knee Pain    Rose Bridges is a 62 y.o. female.  Patient is a 62 year old female with a past medical history of osteogenesis imperfecta and osteoporosis and prior pelvic fracture presenting to the emergency department with right knee pain.  Patient states that she has been having difficulty managing her chronic pain right knee.  She states that she has been trying to work with physical therapy but every time she does therapy the pain significantly worsens in her knee and radiates up her thigh.  She states that she feels like her knee is swollen.  She denies any new falls or injuries.  She has been taking Robaxin, gabapentin and oxycodone and naproxen for pain control which she states it does help but the pain always returns when the medication wears off.  The history is provided by the patient.  Knee Pain      Home Medications Prior to Admission medications   Medication Sig Start Date End Date Taking? Authorizing Provider  cyclobenzaprine (FLEXERIL) 10 MG tablet Take 1 tablet (10 mg total) by mouth 2 (two) times daily as needed for muscle spasms. 03/02/23  Yes Elayne Snare K, DO  aspirin 81 MG chewable tablet Chew by mouth daily.    [provider]  Baclofen 5 MG TABS Take 1 tablet by mouth every 8 (eight) hours as needed for muscle spasms. 04/07/22   Celine Mans, MD  calcitonin, salmon, (MIACALCIN/FORTICAL) 200 UNIT/ACT nasal spray Place 1 spray into alternate nostrils daily. 04/08/22   Celine Mans, MD  calcium carbonate (TUMS - DOSED IN MG ELEMENTAL CALCIUM) 500 MG chewable tablet Chew 1 tablet by mouth 2 (two) times daily.    [provider]  cephALEXin (KEFLEX) 500 MG capsule Take 1 capsule (500 mg total) by mouth 4 (four) times daily. 11/13/22   Mannie Stabile,  PA-C  docusate sodium (COLACE) 100 MG capsule Take 100 mg by mouth 2 (two) times daily.    [provider]  FLUoxetine (PROZAC) 10 MG tablet Take 10 mg by mouth daily.    [provider]  folic acid (FOLVITE) 1 MG tablet Take 1 tablet (1 mg total) by mouth daily. 09/09/19   Storm Frisk, MD  gabapentin (NEURONTIN) 100 MG capsule Take 3 capsules (300 mg total) by mouth 3 (three) times daily. 02/12/23   Armbruster, Willaim Rayas, MD  hydrOXYzine (ATARAX) 10 MG tablet Take 1 tablet (10 mg total) by mouth 3 (three) times daily as needed for anxiety. 05/25/22   Noralee Stain, DO  levETIRAcetam (KEPPRA) 500 MG tablet Take 1 tablet (500 mg total) by mouth 2 (two) times daily. 05/18/22   Leatha Gilding, MD  magnesium gluconate (MAGONATE) 500 MG tablet Take 500 mg by mouth 2 (two) times daily.    [provider]  methocarbamol (ROBAXIN) 500 MG tablet Take 1 tablet (500 mg total) by mouth 2 (two) times daily. 02/20/23   Darrick Grinder, PA-C  naproxen (NAPROSYN) 375 MG tablet Take 1 tablet (375 mg total) by mouth 2 (two) times daily for 15 days. 02/15/23 03/02/23  Schutt, Edsel Petrin, PA-C  oxyCODONE (ROXICODONE) 5 MG immediate release tablet Take 1 tablet (5 mg total) by mouth every 6 (six) hours. Patient taking differently: Take 5 mg by mouth See admin instructions. Every 10  hours 05/25/22   Noralee Stain, DO  oxyCODONE (ROXICODONE) 5 MG immediate release tablet Take 1 tablet (5 mg total) by mouth every 4 (four) hours as needed for severe pain or breakthrough pain. Can take tablet for breakthrough on top of her scheduled oxycodone 05/25/22   Noralee Stain, DO  pantoprazole (PROTONIX) 20 MG tablet Take 2 tablets (40 mg total) by mouth daily. 01/29/23   Armbruster, Willaim Rayas, MD  polyethylene glycol (MIRALAX / GLYCOLAX) 17 g packet Take 17 g by mouth daily.    [provider]  senna (SENOKOT) 8.6 MG TABS tablet Take 1 tablet by mouth.    [provider]  thiamine  100 MG tablet Take 1 tablet (100 mg total) by mouth daily. 09/09/19   Storm Frisk, MD  Vitamin D, Ergocalciferol, (DRISDOL) 1.25 MG (50000 UNIT) CAPS capsule Take 1 capsule (50,000 Units total) by mouth every 7 (seven) days. Patient taking differently: Take 50,000 Units by mouth every 7 (seven) days. Thursdays 04/12/22   Celine Mans, MD      Allergies    Codeine    Review of Systems   Review of Systems  Physical Exam Updated Vital Signs BP 123/78 (BP Location: Left Arm)   Pulse 67   Temp 97.9 F (36.6 C) (Oral)   Resp 16   Ht 5\' 4"  (1.626 m)   Wt 58.9 kg   SpO2 97%   BMI 22.29 kg/m  Physical Exam Vitals and nursing note reviewed.  Constitutional:      General: She is not in acute distress.    Appearance: Normal appearance. She is obese.  HENT:     Head: Normocephalic and atraumatic.     Nose: Nose normal.     Mouth/Throat:     Mouth: Mucous membranes are moist.  Eyes:     Extraocular Movements: Extraocular movements intact.     Conjunctiva/sclera: Conjunctivae normal.  Cardiovascular:     Rate and Rhythm: Normal rate.     Pulses: Normal pulses.  Pulmonary:     Effort: Pulmonary effort is normal.     Breath sounds: Normal breath sounds.  Abdominal:     General: Abdomen is flat.  Musculoskeletal:        General: Normal range of motion.     Cervical back: Normal range of motion.     Comments: Tenderness to palpation of medial right knee joint, no palpable knee joint effusion bilaterally, no knee joint laxity, knee flexion/extension intact, no hip pain with internal/external rotation bilaterally No tenderness to palpation of left lower extremity  Skin:    General: Skin is warm and dry.  Neurological:     General: No focal deficit present.     Mental Status: She is alert and oriented to person, place, and time.  Psychiatric:        Mood and Affect: Mood normal.        Behavior: Behavior normal.     ED Results / Procedures / Treatments   Labs (all  labs ordered are listed, but only abnormal results are displayed) Labs Reviewed - No data to display  EKG None  Radiology No results found.  Procedures Procedures    Medications Ordered in ED Medications  cyclobenzaprine (FLEXERIL) tablet 10 mg (10 mg Oral Given 03/02/23 1559)    ED Course/ Medical Decision Making/ A&P  Medical Decision Making This patient presents to the ED with chief complaint(s) of knee pain with pertinent past medical history of osteogenesis imperfecta, osteoporosis, prior pelvic fracture which further complicates the presenting complaint. The complaint involves an extensive differential diagnosis and also carries with it a high risk of complications and morbidity.    The differential diagnosis includes knee sprain, muscle spasm, muscle strain, no trauma and recent negative knee x-ray 1 week ago making a fracture dislocation unlikely, no palpable knee joint effusion, no erythema and ROM intact making septic joint unlikely  Additional history obtained: Additional history obtained from N/A Records reviewed multiple recent ED records  ED Course and Reassessment: On patient's arrival she is resting in bed in no acute distress.  Does have some medial knee joint tenderness though no knee joint laxity and no point bony tenderness.  Suspect patient may have sprain or muscle strain from recently starting physical therapy.  She has been seen in the ED multiple times with similar symptoms.  She states that she would like to try a different muscle relaxer and will try Flexeril over her Robaxin.  She has no signs of septic joint and is neurovascularly intact.  N/A  Independent visualization of imaging: - N/A  Consultation: - Consulted or discussed management/test interpretation w/ external professional: N/A  Consideration for admission or further workup: Patient has no emergent conditions requiring admission or further work-up at this  time and is stable for discharge home with primary care follow-up  Social Determinants of health: N/A    Risk Prescription drug management.          Final Clinical Impression(s) / ED Diagnoses Final diagnoses:  Chronic pain of right knee    Rx / DC Orders ED Discharge Orders          Ordered    cyclobenzaprine (FLEXERIL) 10 MG tablet  2 times daily PRN        03/02/23 1803              Rexford Maus, DO 03/02/23 1804

## 2023-03-02 NOTE — ED Triage Notes (Signed)
Pt tx here on 10/31 for R knee pain.  Here again stating the meds are not working and the nurse at the facility [facility]) said she may need an [place].  Pt ao x 4.

## 2023-03-02 NOTE — Discharge Instructions (Signed)
You were seen in the emergency department for your knee pain.  You had no signs of broken bones or infection to your knee.  You may have sprained your knee or your muscles may be sore from starting back with therapy.  You can continue to take your medications as prescribed at your nursing home and you can try Flexeril instead of Robaxin to help with muscle spasms.  It may help to take the muscle relaxer prior to therapy to prevent your muscles from spasming during therapy.  You should return to the emergency department if you have fevers, redness and swelling to your knee or any other new or concerning symptoms.

## 2023-03-04 ENCOUNTER — Other Ambulatory Visit: Payer: Self-pay

## 2023-03-13 ENCOUNTER — Other Ambulatory Visit: Payer: Self-pay

## 2023-03-29 ENCOUNTER — Encounter (HOSPITAL_COMMUNITY): Payer: Self-pay

## 2023-03-29 ENCOUNTER — Emergency Department (HOSPITAL_COMMUNITY): Payer: Medicare Other

## 2023-03-29 ENCOUNTER — Emergency Department (HOSPITAL_COMMUNITY)
Admission: EM | Admit: 2023-03-29 | Discharge: 2023-03-29 | Disposition: A | Discharge status: Home / Self Care | Payer: Medicare Other | Attending: Emergency Medicine | Admitting: Emergency Medicine

## 2023-03-29 ENCOUNTER — Other Ambulatory Visit: Payer: Self-pay

## 2023-03-29 DIAGNOSIS — R443 Hallucinations, unspecified: Secondary | ICD-10-CM | POA: Diagnosis present

## 2023-03-29 DIAGNOSIS — Z7982 Long term (current) use of aspirin: Secondary | ICD-10-CM | POA: Diagnosis not present

## 2023-03-29 DIAGNOSIS — Z91199 Patient's noncompliance with other medical treatment and regimen due to unspecified reason: Secondary | ICD-10-CM

## 2023-03-29 LAB — CBC WITH DIFFERENTIAL/PLATELET
Abs Immature Granulocytes: 0.02 10*3/uL (ref 0.00–0.07)
Basophils Absolute: 0 10*3/uL (ref 0.0–0.1)
Basophils Relative: 0 %
Eosinophils Absolute: 0.1 10*3/uL (ref 0.0–0.5)
Eosinophils Relative: 2 %
HCT: 44.6 % (ref 36.0–46.0)
Hemoglobin: 14.5 g/dL (ref 12.0–15.0)
Immature Granulocytes: 0 %
Lymphocytes Relative: 34 %
Lymphs Abs: 2.7 10*3/uL (ref 0.7–4.0)
MCH: 31.4 pg (ref 26.0–34.0)
MCHC: 32.5 g/dL (ref 30.0–36.0)
MCV: 96.5 fL (ref 80.0–100.0)
Monocytes Absolute: 0.6 10*3/uL (ref 0.1–1.0)
Monocytes Relative: 7 %
Neutro Abs: 4.6 10*3/uL (ref 1.7–7.7)
Neutrophils Relative %: 57 %
Platelets: 211 10*3/uL (ref 150–400)
RBC: 4.62 MIL/uL (ref 3.87–5.11)
RDW: 12.4 % (ref 11.5–15.5)
WBC: 8.1 10*3/uL (ref 4.0–10.5)
nRBC: 0 % (ref 0.0–0.2)

## 2023-03-29 LAB — COMPREHENSIVE METABOLIC PANEL
ALT: 16 U/L (ref 0–44)
AST: 34 U/L (ref 15–41)
Albumin: 3.6 g/dL (ref 3.5–5.0)
Alkaline Phosphatase: 75 U/L (ref 38–126)
Anion gap: 13 (ref 5–15)
BUN: 30 mg/dL — ABNORMAL HIGH (ref 8–23)
CO2: 23 mmol/L (ref 22–32)
Calcium: 9.4 mg/dL (ref 8.9–10.3)
Chloride: 104 mmol/L (ref 98–111)
Creatinine, Ser: 0.92 mg/dL (ref 0.44–1.00)
GFR, Estimated: >60 mL/min (ref >60)
Glucose, Bld: 126 mg/dL — ABNORMAL HIGH (ref 70–99)
Potassium: 4.6 mmol/L (ref 3.5–5.1)
Sodium: 140 mmol/L (ref 135–145)
Total Bilirubin: 1 mg/dL (ref <1.2)
Total Protein: 7 g/dL (ref 6.5–8.1)

## 2023-03-29 LAB — URINALYSIS, ROUTINE W REFLEX MICROSCOPIC
Bilirubin Urine: NEGATIVE
Glucose, UA: NEGATIVE mg/dL
Hgb urine dipstick: NEGATIVE
Ketones, ur: NEGATIVE mg/dL
Leukocytes,Ua: NEGATIVE
Nitrite: NEGATIVE
Protein, ur: NEGATIVE mg/dL
Specific Gravity, Urine: 1.017 (ref 1.005–1.030)
pH: 5 (ref 5.0–8.0)

## 2023-03-29 NOTE — Progress Notes (Signed)
CSW received call from Stanley at [facility] who states patient is LTC at the facility and reports she can return at discharge. CSW informed Burna Mortimer that patient is currently receiving a medical work up and that updates will be provided as they become available.  Edwin Dada, MSW, LCSW Transitions of Care  Clinical Social Worker II [phone]

## 2023-03-29 NOTE — ED Provider Notes (Signed)
[place] EMERGENCY DEPARTMENT AT [facility] Provider Note   CSN: [id number] Arrival date & time: 03/29/23  1032     History  Chief Complaint  Patient presents with   Hallucinations    Rose Bridges is a 62 y.o. female.  Patient complains of being concerned that she had a stroke.  Patient reports that she felt unwell this a.m.  Patient complains of being unhappy at the facility that she is at.  Patient states she has refused to eat or drink today.  Patient states that she did eat Thanksgiving dinner yesterday and drink fluids.  Patient states she would not take her medications today because she is concerned that they were trying to give her medicine that is not hers.  Patient reports that she feels better when she does not take her medicines.  Patient reports she is more clearheaded.  Patient denies any cough or congestion she denies any abdominal pain.  She denies any new areas of weakness.  Patient has a past medical history of osteogenesis imperfecta.  Patient denies any current bony injuries she is not having any bone pain.  Patient reports that she has been trying to transfer to another facility.  The history is provided by the patient. No language interpreter was used.       Home Medications Prior to Admission medications   Medication Sig Start Date End Date Taking? Authorizing Provider  aspirin 81 MG chewable tablet Chew by mouth daily.    [provider]  Baclofen 5 MG TABS Take 1 tablet by mouth every 8 (eight) hours as needed for muscle spasms. 04/07/22   Celine Mans, MD  calcitonin, salmon, (MIACALCIN/FORTICAL) 200 UNIT/ACT nasal spray Place 1 spray into alternate nostrils daily. 04/08/22   Celine Mans, MD  calcium carbonate (TUMS - DOSED IN MG ELEMENTAL CALCIUM) 500 MG chewable tablet Chew 1 tablet by mouth 2 (two) times daily.    [provider]  cephALEXin (KEFLEX) 500 MG capsule Take 1 capsule (500 mg total) by mouth 4 (four)  times daily. 11/13/22   Mannie Stabile, PA-C  cyclobenzaprine (FLEXERIL) 10 MG tablet Take 1 tablet (10 mg total) by mouth 2 (two) times daily as needed for muscle spasms. 03/02/23   Elayne Snare K, DO  docusate sodium (COLACE) 100 MG capsule Take 100 mg by mouth 2 (two) times daily.    [provider]  FLUoxetine (PROZAC) 10 MG tablet Take 10 mg by mouth daily.    [provider]  folic acid (FOLVITE) 1 MG tablet Take 1 tablet (1 mg total) by mouth daily. 09/09/19   Storm Frisk, MD  gabapentin (NEURONTIN) 100 MG capsule Take 3 capsules (300 mg total) by mouth 3 (three) times daily. 02/12/23   Armbruster, Willaim Rayas, MD  hydrOXYzine (ATARAX) 10 MG tablet Take 1 tablet (10 mg total) by mouth 3 (three) times daily as needed for anxiety. 05/25/22   Noralee Stain, DO  levETIRAcetam (KEPPRA) 500 MG tablet Take 1 tablet (500 mg total) by mouth 2 (two) times daily. 05/18/22   Leatha Gilding, MD  magnesium gluconate (MAGONATE) 500 MG tablet Take 500 mg by mouth 2 (two) times daily.    [provider]  methocarbamol (ROBAXIN) 500 MG tablet Take 1 tablet (500 mg total) by mouth 2 (two) times daily. 02/20/23   Darrick Grinder, PA-C  oxyCODONE (ROXICODONE) 5 MG immediate release tablet Take 1 tablet (5 mg total) by mouth every 6 (six) hours. Patient taking  differently: Take 5 mg by mouth See admin instructions. Every 10 hours 05/25/22   Noralee Stain, DO  oxyCODONE (ROXICODONE) 5 MG immediate release tablet Take 1 tablet (5 mg total) by mouth every 4 (four) hours as needed for severe pain or breakthrough pain. Can take tablet for breakthrough on top of her scheduled oxycodone 05/25/22   Noralee Stain, DO  pantoprazole (PROTONIX) 20 MG tablet Take 2 tablets (40 mg total) by mouth daily. 01/29/23   Armbruster, Willaim Rayas, MD  polyethylene glycol (MIRALAX / GLYCOLAX) 17 g packet Take 17 g by mouth daily.    [provider]  senna (SENOKOT) 8.6 MG TABS tablet Take 1  tablet by mouth.    [provider]  thiamine 100 MG tablet Take 1 tablet (100 mg total) by mouth daily. 09/09/19   Storm Frisk, MD  Vitamin D, Ergocalciferol, (DRISDOL) 1.25 MG (50000 UNIT) CAPS capsule Take 1 capsule (50,000 Units total) by mouth every 7 (seven) days. Patient taking differently: Take 50,000 Units by mouth every 7 (seven) days. Thursdays 04/12/22   Celine Mans, MD      Allergies    Codeine    Review of Systems   Review of Systems  Constitutional:  Negative for fever.  Psychiatric/Behavioral:  Negative for agitation and confusion.   All other systems reviewed and are negative.   Physical Exam Updated Vital Signs BP 129/67   Pulse 83   Temp 98 F (36.7 C) (Oral)   Resp 18   SpO2 96%  Physical Exam Vitals and nursing note reviewed.  Constitutional:      Appearance: She is well-developed.  HENT:     Head: Normocephalic.     Mouth/Throat:     Mouth: Mucous membranes are moist.  Eyes:     Extraocular Movements: Extraocular movements intact.     Pupils: Pupils are equal, round, and reactive to light.  Cardiovascular:     Rate and Rhythm: Normal rate.  Pulmonary:     Effort: Pulmonary effort is normal.  Abdominal:     General: Abdomen is flat. There is no distension.  Musculoskeletal:        General: Normal range of motion.     Cervical back: Normal range of motion.  Skin:    General: Skin is warm.  Neurological:     General: No focal deficit present.     Mental Status: She is alert and oriented to person, place, and time.  Psychiatric:        Mood and Affect: Mood normal.        Behavior: Behavior normal.     ED Results / Procedures / Treatments   Labs (all labs ordered are listed, but only abnormal results are displayed) Labs Reviewed  COMPREHENSIVE METABOLIC PANEL - Abnormal; Notable for the following components:      Result Value   Glucose, Bld 126 (*)    BUN 30 (*)    All other components within normal limits   URINALYSIS, ROUTINE W REFLEX MICROSCOPIC - Abnormal; Notable for the following components:   APPearance HAZY (*)    All other components within normal limits  CBC WITH DIFFERENTIAL/PLATELET    EKG None  Radiology CT Head Wo Contrast  Result Date: 03/29/2023 CLINICAL DATA:  Facial paralysis/weakness (CN 7) normally when she gets a UTI she behaves like this. Pt has urinary incontinence. Pt is AOx4. Hallucinations. EXAM: CT HEAD WITHOUT CONTRAST TECHNIQUE: Contiguous axial images were obtained from the base of the skull  through the vertex without intravenous contrast. RADIATION DOSE REDUCTION: This exam was performed according to the departmental dose-optimization program which includes automated exposure control, adjustment of the mA and/or kV according to patient size and/or use of iterative reconstruction technique. COMPARISON:  CT head 10/09/2022 FINDINGS: Brain: Chronic bilateral basal ganglia lacunar infarctions. Patchy and confluent areas of decreased attenuation are noted throughout the deep and periventricular white matter of the cerebral hemispheres bilaterally, compatible with chronic microvascular ischemic disease. No evidence of large-territorial acute infarction. No parenchymal hemorrhage. No mass lesion. No extra-axial collection. No mass effect or midline shift. No hydrocephalus. Basilar cisterns are patent. Vascular: No hyperdense vessel. Atherosclerotic calcifications are present within the cavernous internal carotid arteries. Skull: No acute fracture or focal lesion. Sinuses/Orbits: Paranasal sinuses and mastoid air cells are clear. The orbits are unremarkable. Other: None. IMPRESSION: No acute intracranial abnormality. Electronically Signed   By: Tish Frederickson M.D.   On: 03/29/2023 12:15   DG Chest Port 1 View  Result Date: 03/29/2023 CLINICAL DATA:  Weakness.  Hallucinations. EXAM: PORTABLE CHEST 1 VIEW COMPARISON:  05/10/2022 x-ray FINDINGS: Slight left basilar atelectasis.  No pneumothorax, effusion or edema. Normal cardiopericardial silhouette. No consolidation fixation hardware about the proximal right humerus. Dystrophic calcifications noted along each breast. IMPRESSION: Minimal left basilar atelectasis.  No consolidation or effusion Electronically Signed   By: Karen Kays M.D.   On: 03/29/2023 12:05    Procedures Procedures    Medications Ordered in ED Medications - No data to display  ED Course/ Medical Decision Making/ A&P                                 Medical Decision Making Patient complains of some weakness.  Amount and/or Complexity of Data Reviewed Independent Historian: EMS Labs: ordered. Decision-making details documented in ED Course.    Details: UA is negative CBC is normal glucose is 126 Radiology: ordered and independent interpretation performed. Decision-making details documented in ED Course.    Details: Chest x-ray shows no acute findings CT head shows chronic changes/multiple chronic old infarcts.  Risk Risk Details: Patient does not have symptoms consistent with CVA.  She does not have a UTI.  Patient states that she is not eating or drinking, however she does have normal laboratory evaluations with no evidence of being malnourished or dehydrated. Patient reports that the facility is trying to help her find a new facility.  Patient is aware of her inappropriate behavior and that she makes it difficult for the staff to care for her..  Patient is not having any suicidal or homicidal thoughts.  She is not acutely psychotic.Marland Kitchen  Patient is stable for discharge.  She is advised to return for any problems.           Final Clinical Impression(s) / ED Diagnoses Final diagnoses:  Medically noncompliant    Rx / DC Orders ED Discharge Orders     None         Osie Cheeks 03/29/23 1406    Rondel Baton, MD 03/29/23 [phone]

## 2023-03-29 NOTE — ED Triage Notes (Signed)
[facility] reports pt coming from [facility] for hallucinations. Staff states normally when she gets a UTI she behaves like this. Pt has urinary incontinence. Pt is AOx4

## 2023-03-29 NOTE — ED Notes (Signed)
Attempted to call [name] for report no answer will re-attempt [phone]

## 2023-03-29 NOTE — ED Notes (Signed)
[name] at bedside for transport.  

## 2023-03-29 NOTE — ED Notes (Signed)
[name] called, eta 2 hours

## 2023-04-03 ENCOUNTER — Emergency Department (HOSPITAL_COMMUNITY)
Admission: EM | Admit: 2023-04-03 | Discharge: 2023-04-03 | Disposition: A | Discharge status: Home / Self Care | Payer: Medicare Other | Attending: Emergency Medicine | Admitting: Emergency Medicine

## 2023-04-03 ENCOUNTER — Emergency Department (HOSPITAL_COMMUNITY): Payer: Medicare Other

## 2023-04-03 ENCOUNTER — Other Ambulatory Visit: Payer: Self-pay

## 2023-04-03 DIAGNOSIS — Z7982 Long term (current) use of aspirin: Secondary | ICD-10-CM | POA: Insufficient documentation

## 2023-04-03 DIAGNOSIS — M25562 Pain in left knee: Secondary | ICD-10-CM | POA: Insufficient documentation

## 2023-04-03 DIAGNOSIS — I10 Essential (primary) hypertension: Secondary | ICD-10-CM | POA: Diagnosis not present

## 2023-04-03 DIAGNOSIS — Z79899 Other long term (current) drug therapy: Secondary | ICD-10-CM | POA: Insufficient documentation

## 2023-04-03 DIAGNOSIS — G8929 Other chronic pain: Secondary | ICD-10-CM | POA: Insufficient documentation

## 2023-04-03 DIAGNOSIS — M25561 Pain in right knee: Secondary | ICD-10-CM | POA: Diagnosis present

## 2023-04-03 MED ORDER — OXYCODONE-ACETAMINOPHEN 5-325 MG PO TABS
1.0000 | ORAL_TABLET | Freq: Once | ORAL | Status: AC
  Administered 2023-04-03: 1 via ORAL
  Filled 2023-04-03: qty 1

## 2023-04-03 MED ORDER — KETOROLAC TROMETHAMINE 15 MG/ML IJ SOLN
15.0000 mg | Freq: Once | INTRAMUSCULAR | Status: AC
  Administered 2023-04-03: 15 mg via INTRAMUSCULAR
  Filled 2023-04-03: qty 1

## 2023-04-03 NOTE — ED Notes (Signed)
[name] called, eta "16th in line"

## 2023-04-03 NOTE — ED Triage Notes (Signed)
Pt arrived via [facility] from [facility] for bilateral knee pain, on going issue. A&Ox4, GCS 15. VSS.

## 2023-04-03 NOTE — ED Provider Notes (Signed)
[place] EMERGENCY DEPARTMENT AT [facility] Provider Note   CSN: [id number] Arrival date & time: 04/03/23  1436     History  Chief Complaint  Patient presents with   Knee Pain    Rose Bridges is a 62 y.o. female.  Patient with history of osteogenesis imperfecta, HTN, HLD, chronic pain --presents from her facility complaining of bilateral knee pain.  She is unable to tell me when exactly this started but it has been going on for at least a month.  Patient has had several ED visits related to right knee pain.  She states that today her left knee is hurting as well.  She feels that her knees are swollen.  No associated fevers.  No new falls or injuries.  She states that she is mainly bedbound.  She states that she was having her knees "exercised" however this was causing so much discomfort that she has not had any sort of therapy over the past several months.  She feels that her facility "has given up on me".  Currently she is on muscle relaxers, gabapentin, oxycodone.       Home Medications Prior to Admission medications   Medication Sig Start Date End Date Taking? Authorizing Provider  aspirin 81 MG chewable tablet Chew by mouth daily.    [provider]  Baclofen 5 MG TABS Take 1 tablet by mouth every 8 (eight) hours as needed for muscle spasms. 04/07/22   Celine Mans, MD  calcitonin, salmon, (MIACALCIN/FORTICAL) 200 UNIT/ACT nasal spray Place 1 spray into alternate nostrils daily. 04/08/22   Celine Mans, MD  calcium carbonate (TUMS - DOSED IN MG ELEMENTAL CALCIUM) 500 MG chewable tablet Chew 1 tablet by mouth 2 (two) times daily.    [provider]  cephALEXin (KEFLEX) 500 MG capsule Take 1 capsule (500 mg total) by mouth 4 (four) times daily. 11/13/22   Mannie Stabile, PA-C  cyclobenzaprine (FLEXERIL) 10 MG tablet Take 1 tablet (10 mg total) by mouth 2 (two) times daily as needed for muscle spasms. 03/02/23   Elayne Snare K, DO   docusate sodium (COLACE) 100 MG capsule Take 100 mg by mouth 2 (two) times daily.    [provider]  FLUoxetine (PROZAC) 10 MG tablet Take 10 mg by mouth daily.    [provider]  folic acid (FOLVITE) 1 MG tablet Take 1 tablet (1 mg total) by mouth daily. 09/09/19   Storm Frisk, MD  gabapentin (NEURONTIN) 100 MG capsule Take 3 capsules (300 mg total) by mouth 3 (three) times daily. 02/12/23   Armbruster, Willaim Rayas, MD  hydrOXYzine (ATARAX) 10 MG tablet Take 1 tablet (10 mg total) by mouth 3 (three) times daily as needed for anxiety. 05/25/22   Noralee Stain, DO  levETIRAcetam (KEPPRA) 500 MG tablet Take 1 tablet (500 mg total) by mouth 2 (two) times daily. 05/18/22   Leatha Gilding, MD  magnesium gluconate (MAGONATE) 500 MG tablet Take 500 mg by mouth 2 (two) times daily.    [provider]  methocarbamol (ROBAXIN) 500 MG tablet Take 1 tablet (500 mg total) by mouth 2 (two) times daily. 02/20/23   Darrick Grinder, PA-C  oxyCODONE (ROXICODONE) 5 MG immediate release tablet Take 1 tablet (5 mg total) by mouth every 6 (six) hours. Patient taking differently: Take 5 mg by mouth See admin instructions. Every 10 hours 05/25/22   Noralee Stain, DO  oxyCODONE (ROXICODONE) 5 MG immediate release tablet Take 1 tablet (5  mg total) by mouth every 4 (four) hours as needed for severe pain or breakthrough pain. Can take tablet for breakthrough on top of her scheduled oxycodone 05/25/22   Noralee Stain, DO  pantoprazole (PROTONIX) 20 MG tablet Take 2 tablets (40 mg total) by mouth daily. 01/29/23   Armbruster, Willaim Rayas, MD  polyethylene glycol (MIRALAX / GLYCOLAX) 17 g packet Take 17 g by mouth daily.    [provider]  senna (SENOKOT) 8.6 MG TABS tablet Take 1 tablet by mouth.    [provider]  thiamine 100 MG tablet Take 1 tablet (100 mg total) by mouth daily. 09/09/19   Storm Frisk, MD  Vitamin D, Ergocalciferol, (DRISDOL) 1.25 MG (50000 UNIT) CAPS  capsule Take 1 capsule (50,000 Units total) by mouth every 7 (seven) days. Patient taking differently: Take 50,000 Units by mouth every 7 (seven) days. Thursdays 04/12/22   Celine Mans, MD      Allergies    Codeine    Review of Systems   Review of Systems  Physical Exam Updated Vital Signs BP 127/78 (BP Location: Right Arm)   Pulse 77   Temp 98.2 F (36.8 C) (Oral)   Resp 18   Ht 5\' 4"  (1.626 m)   Wt 58.9 kg   SpO2 97%   BMI 22.29 kg/m  Physical Exam Vitals and nursing note reviewed.  Constitutional:      Appearance: She is well-developed.  HENT:     Head: Normocephalic and atraumatic.  Eyes:     Pupils: Pupils are equal, round, and reactive to light.  Cardiovascular:     Pulses: Normal pulses. No decreased pulses.  Musculoskeletal:        General: Tenderness present.     Cervical back: Normal range of motion and neck supple.     Right knee: No effusion. Decreased range of motion. Tenderness present.     Left knee: No effusion. Decreased range of motion. Tenderness present.     Comments: Patient has generalized tenderness of both knees.  There is no erythema.  There is no appreciable effusion, potentially very mild swelling on the left compared to the right.  No calf or thigh swelling.  Patient winces when I try to actively range her knees on either side.  Skin:    General: Skin is warm and dry.  Neurological:     Mental Status: She is alert.     Sensory: No sensory deficit.     Comments: Motor, sensation, and vascular distal to the injury is fully intact.   Psychiatric:        Mood and Affect: Mood normal.     ED Results / Procedures / Treatments   Labs (all labs ordered are listed, but only abnormal results are displayed) Labs Reviewed - No data to display  EKG None  Radiology DG Knee Complete 4 Views Left  Result Date: 04/03/2023 CLINICAL DATA:  Left knee pain.  No known injury. EXAM: LEFT KNEE - COMPLETE 4+ VIEW COMPARISON:  Left femur radiographs  11/05/2016 FINDINGS: There is diffuse decreased bone mineralization. No joint effusion. Mild medial compartment joint space narrowing. No acute fracture or dislocation. Small bony excrescence extending medially and distally from the medial aspect of the proximal femoral metadiaphysis, away from the knee joint. This is compatible with a benign osteochondroma. IMPRESSION: 1. Mild medial compartment osteoarthritis. 2. Incidental note of probable small osteochondroma of the proximal femoral metadiaphysis. Electronically Signed   By: Kerin Salen.D.  On: 04/03/2023 16:24    Procedures Procedures    Medications Ordered in ED Medications - No data to display  ED Course/ Medical Decision Making/ A&P    Patient seen and examined. History obtained directly from patient.  Reviewed recent ED visits.  Reviewed most recent hospitalization discharge summary.  Labs/EKG: None ordered  Imaging: Ordered x-ray of the left knee.  Patient had 2 negative x-rays of the right knee in October 2024 and denies new injuries.  Medications/Fluids: Ordered: IM Toradol, p.o. oxycodone  Most recent vital signs reviewed and are as follows: BP 127/78 (BP Location: Right Arm)   Pulse 77   Temp 98.2 F (36.8 C) (Oral)   Resp 18   Ht 5\' 4"  (1.626 m)   Wt 58.9 kg   SpO2 97%   BMI 22.29 kg/m   Initial impression: Chronic knee pain.  I do not see any signs of acute inflammation.  Low concern for septic arthritis.  Exam not consistent with DVT.  I suspect that this is more of a chronic pain issue than anything else today.  I do not feel that she requires emergent orthopedic consultation.  She has not had any recent imaging of her left knee and we will obtain this today and give a dose of oxycodone and IM Toradol.  Anticipate discharge back to her facility if x-ray looks normal.  Vital signs reviewed, no tachycardia or fever.  4:42 PM Reassessment performed. Patient appears stable.  Imaging personally visualized and  interpreted including: X-ray of the knee, agree no fracture or dislocation.  Reviewed pertinent lab work and imaging with patient at bedside. Questions answered.   Most current vital signs reviewed and are as follows: BP 127/78 (BP Location: Right Arm)   Pulse 77   Temp 98.2 F (36.8 C) (Oral)   Resp 18   Ht 5\' 4"  (1.626 m)   Wt 58.9 kg   SpO2 97%   BMI 22.29 kg/m   Plan: Discharge to home.  I offered to have either knee wrapped, patient declines.  Prescriptions written for: None  Other home care instructions discussed: Continued home treatment, outpatient follow-up  ED return instructions discussed: Discussed signs and symptoms of joint infection, DVT, or other alarm symptoms which should cause return.  Follow-up instructions discussed: Patient encouraged to follow-up with their PCP as planned.                                 Medical Decision Making Amount and/or Complexity of Data Reviewed Radiology: ordered.  Risk Prescription drug management.   Patient with chronic knee pain.  No new injuries.  She had more significant pain in her left knee today.  This was evaluated with x-ray.  X-ray negative for acute findings.  No concern for septic arthritis, DVT.  No emergent indication for orthopedic evaluation or advanced imaging.  The patient's vital signs, pertinent lab work and imaging were reviewed and interpreted as discussed in the ED course. Hospitalization was considered for further testing, treatments, or serial exams/observation. However as patient is well-appearing, has a stable exam, and reassuring studies today, I do not feel that they warrant admission at this time. This plan was discussed with the patient who verbalizes agreement and comfort with this plan and seems reliable and able to return to the Emergency Department with worsening or changing symptoms.          Final Clinical Impression(s) / ED Diagnoses  Final diagnoses:  Chronic pain of both knees     Rx / DC Orders ED Discharge Orders     None         Renne Crigler, Cordelia Poche 04/03/23 1645    Benjiman Core, MD 04/03/23 2225

## 2023-04-03 NOTE — Discharge Instructions (Signed)
You had an x-ray of your left knee today which showed some arthritis on the inside of the knee, otherwise no acute or emergent findings today.  Please continue to take your medications for symptom control.  This problem can be followed up as an outpatient with your primary care or orthopedist.  If you develop fevers, much worse swelling, increasing warmth or redness of the knee around the knee, or if you develop significant pain or swelling in the thigh or calf area --please return to the emergency department for further evaluation.

## 2023-04-04 ENCOUNTER — Ambulatory Visit: Payer: Medicare Other | Admitting: Dietician

## 2023-04-07 ENCOUNTER — Other Ambulatory Visit: Payer: Self-pay

## 2023-04-07 ENCOUNTER — Emergency Department (HOSPITAL_COMMUNITY): Payer: Medicare Other

## 2023-04-07 ENCOUNTER — Encounter (HOSPITAL_COMMUNITY): Payer: Self-pay

## 2023-04-07 ENCOUNTER — Inpatient Hospital Stay (HOSPITAL_COMMUNITY)
Admission: EM | Admit: 2023-04-07 | Discharge: 2023-04-11 | DRG: 682 | Disposition: A | Discharge status: Skilled Nursing Facility | Payer: Medicare Other | Source: Skilled Nursing Facility | Attending: Internal Medicine | Admitting: Internal Medicine

## 2023-04-07 DIAGNOSIS — G40909 Epilepsy, unspecified, not intractable, without status epilepticus: Secondary | ICD-10-CM | POA: Diagnosis present

## 2023-04-07 DIAGNOSIS — N179 Acute kidney failure, unspecified: Principal | ICD-10-CM | POA: Diagnosis present

## 2023-04-07 DIAGNOSIS — Z801 Family history of malignant neoplasm of trachea, bronchus and lung: Secondary | ICD-10-CM

## 2023-04-07 DIAGNOSIS — Z885 Allergy status to narcotic agent status: Secondary | ICD-10-CM

## 2023-04-07 DIAGNOSIS — F32A Depression, unspecified: Secondary | ICD-10-CM | POA: Diagnosis present

## 2023-04-07 DIAGNOSIS — G9340 Encephalopathy, unspecified: Secondary | ICD-10-CM | POA: Diagnosis not present

## 2023-04-07 DIAGNOSIS — F418 Other specified anxiety disorders: Secondary | ICD-10-CM | POA: Diagnosis present

## 2023-04-07 DIAGNOSIS — E870 Hyperosmolality and hypernatremia: Secondary | ICD-10-CM | POA: Diagnosis present

## 2023-04-07 DIAGNOSIS — Z9181 History of falling: Secondary | ICD-10-CM

## 2023-04-07 DIAGNOSIS — N390 Urinary tract infection, site not specified: Principal | ICD-10-CM | POA: Diagnosis present

## 2023-04-07 DIAGNOSIS — Z96642 Presence of left artificial hip joint: Secondary | ICD-10-CM | POA: Diagnosis present

## 2023-04-07 DIAGNOSIS — G8929 Other chronic pain: Secondary | ICD-10-CM | POA: Diagnosis present

## 2023-04-07 DIAGNOSIS — Z8249 Family history of ischemic heart disease and other diseases of the circulatory system: Secondary | ICD-10-CM

## 2023-04-07 DIAGNOSIS — Z823 Family history of stroke: Secondary | ICD-10-CM

## 2023-04-07 DIAGNOSIS — Z1152 Encounter for screening for COVID-19: Secondary | ICD-10-CM

## 2023-04-07 DIAGNOSIS — R296 Repeated falls: Secondary | ICD-10-CM | POA: Diagnosis present

## 2023-04-07 DIAGNOSIS — F1010 Alcohol abuse, uncomplicated: Secondary | ICD-10-CM | POA: Diagnosis present

## 2023-04-07 DIAGNOSIS — M810 Age-related osteoporosis without current pathological fracture: Secondary | ICD-10-CM | POA: Diagnosis present

## 2023-04-07 DIAGNOSIS — I739 Peripheral vascular disease, unspecified: Secondary | ICD-10-CM | POA: Diagnosis present

## 2023-04-07 DIAGNOSIS — N3000 Acute cystitis without hematuria: Secondary | ICD-10-CM | POA: Diagnosis not present

## 2023-04-07 DIAGNOSIS — K861 Other chronic pancreatitis: Secondary | ICD-10-CM | POA: Diagnosis present

## 2023-04-07 DIAGNOSIS — Q780 Osteogenesis imperfecta: Secondary | ICD-10-CM

## 2023-04-07 DIAGNOSIS — E860 Dehydration: Secondary | ICD-10-CM | POA: Diagnosis present

## 2023-04-07 DIAGNOSIS — Z90710 Acquired absence of both cervix and uterus: Secondary | ICD-10-CM

## 2023-04-07 DIAGNOSIS — D638 Anemia in other chronic diseases classified elsewhere: Secondary | ICD-10-CM | POA: Diagnosis present

## 2023-04-07 DIAGNOSIS — T426X5A Adverse effect of other antiepileptic and sedative-hypnotic drugs, initial encounter: Secondary | ICD-10-CM | POA: Diagnosis present

## 2023-04-07 DIAGNOSIS — Z79899 Other long term (current) drug therapy: Secondary | ICD-10-CM

## 2023-04-07 DIAGNOSIS — R569 Unspecified convulsions: Secondary | ICD-10-CM | POA: Diagnosis present

## 2023-04-07 DIAGNOSIS — B9689 Other specified bacterial agents as the cause of diseases classified elsewhere: Secondary | ICD-10-CM | POA: Diagnosis present

## 2023-04-07 DIAGNOSIS — Z7982 Long term (current) use of aspirin: Secondary | ICD-10-CM

## 2023-04-07 DIAGNOSIS — E876 Hypokalemia: Secondary | ICD-10-CM | POA: Diagnosis present

## 2023-04-07 DIAGNOSIS — T4395XA Adverse effect of unspecified psychotropic drug, initial encounter: Secondary | ICD-10-CM | POA: Diagnosis present

## 2023-04-07 DIAGNOSIS — E785 Hyperlipidemia, unspecified: Secondary | ICD-10-CM | POA: Diagnosis present

## 2023-04-07 DIAGNOSIS — R001 Bradycardia, unspecified: Secondary | ICD-10-CM | POA: Diagnosis present

## 2023-04-07 DIAGNOSIS — Z87891 Personal history of nicotine dependence: Secondary | ICD-10-CM

## 2023-04-07 DIAGNOSIS — G928 Other toxic encephalopathy: Secondary | ICD-10-CM | POA: Diagnosis present

## 2023-04-07 DIAGNOSIS — F419 Anxiety disorder, unspecified: Secondary | ICD-10-CM | POA: Diagnosis present

## 2023-04-07 DIAGNOSIS — R4182 Altered mental status, unspecified: Secondary | ICD-10-CM

## 2023-04-07 DIAGNOSIS — Z8673 Personal history of transient ischemic attack (TIA), and cerebral infarction without residual deficits: Secondary | ICD-10-CM

## 2023-04-07 DIAGNOSIS — I10 Essential (primary) hypertension: Secondary | ICD-10-CM | POA: Diagnosis present

## 2023-04-07 LAB — URINALYSIS, W/ REFLEX TO CULTURE (INFECTION SUSPECTED)
Bilirubin Urine: NEGATIVE
Glucose, UA: NEGATIVE mg/dL
Ketones, ur: 20 mg/dL — AB
Nitrite: NEGATIVE
Protein, ur: 30 mg/dL — AB
RBC / HPF: >50 RBC/hpf (ref 0–5)
Specific Gravity, Urine: 1.017 (ref 1.005–1.030)
WBC, UA: >50 WBC/hpf (ref 0–5)
pH: 5 (ref 5.0–8.0)

## 2023-04-07 LAB — PROTIME-INR
INR: 1 (ref 0.8–1.2)
Prothrombin Time: 13.3 s (ref 11.4–15.2)

## 2023-04-07 LAB — COMPREHENSIVE METABOLIC PANEL
ALT: 12 U/L (ref 0–44)
AST: 15 U/L (ref 15–41)
Albumin: 3.8 g/dL (ref 3.5–5.0)
Alkaline Phosphatase: 68 U/L (ref 38–126)
Anion gap: 16 — ABNORMAL HIGH (ref 5–15)
BUN: 51 mg/dL — ABNORMAL HIGH (ref 8–23)
CO2: 20 mmol/L — ABNORMAL LOW (ref 22–32)
Calcium: 9.2 mg/dL (ref 8.9–10.3)
Chloride: 108 mmol/L (ref 98–111)
Creatinine, Ser: 1.43 mg/dL — ABNORMAL HIGH (ref 0.44–1.00)
GFR, Estimated: 41 mL/min — ABNORMAL LOW (ref >60)
Glucose, Bld: 141 mg/dL — ABNORMAL HIGH (ref 70–99)
Potassium: 3.6 mmol/L (ref 3.5–5.1)
Sodium: 144 mmol/L (ref 135–145)
Total Bilirubin: 1 mg/dL (ref <1.2)
Total Protein: 7.4 g/dL (ref 6.5–8.1)

## 2023-04-07 LAB — CBC WITH DIFFERENTIAL/PLATELET
Abs Immature Granulocytes: 0.02 10*3/uL (ref 0.00–0.07)
Basophils Absolute: 0 10*3/uL (ref 0.0–0.1)
Basophils Relative: 0 %
Eosinophils Absolute: 0 10*3/uL (ref 0.0–0.5)
Eosinophils Relative: 0 %
HCT: 47 % — ABNORMAL HIGH (ref 36.0–46.0)
Hemoglobin: 15.2 g/dL — ABNORMAL HIGH (ref 12.0–15.0)
Immature Granulocytes: 0 %
Lymphocytes Relative: 25 %
Lymphs Abs: 2 10*3/uL (ref 0.7–4.0)
MCH: 31.7 pg (ref 26.0–34.0)
MCHC: 32.3 g/dL (ref 30.0–36.0)
MCV: 98.1 fL (ref 80.0–100.0)
Monocytes Absolute: 0.4 10*3/uL (ref 0.1–1.0)
Monocytes Relative: 5 %
Neutro Abs: 5.6 10*3/uL (ref 1.7–7.7)
Neutrophils Relative %: 70 %
Platelets: 255 10*3/uL (ref 150–400)
RBC: 4.79 MIL/uL (ref 3.87–5.11)
RDW: 13 % (ref 11.5–15.5)
WBC: 8.1 10*3/uL (ref 4.0–10.5)
nRBC: 0 % (ref 0.0–0.2)

## 2023-04-07 LAB — RESP PANEL BY RT-PCR (RSV, FLU A&B, COVID)  RVPGX2
Influenza A by PCR: NEGATIVE
Influenza B by PCR: NEGATIVE
Resp Syncytial Virus by PCR: NEGATIVE
SARS Coronavirus 2 by RT PCR: NEGATIVE

## 2023-04-07 LAB — APTT: aPTT: 26 s (ref 24–36)

## 2023-04-07 LAB — I-STAT CG4 LACTIC ACID, ED: Lactic Acid, Venous: 1.8 mmol/L (ref 0.5–1.9)

## 2023-04-07 MED ORDER — ENOXAPARIN SODIUM 40 MG/0.4ML IJ SOSY
40.0000 mg | PREFILLED_SYRINGE | Freq: Every day | INTRAMUSCULAR | Status: DC
  Administered 2023-04-07 – 2023-04-10 (×3): 40 mg via SUBCUTANEOUS
  Filled 2023-04-07 (×4): qty 0.4

## 2023-04-07 MED ORDER — SODIUM CHLORIDE 0.9 % IV SOLN
1.0000 g | INTRAVENOUS | Status: DC
  Administered 2023-04-08 – 2023-04-10 (×3): 1 g via INTRAVENOUS
  Filled 2023-04-07 (×3): qty 10

## 2023-04-07 MED ORDER — SODIUM CHLORIDE 0.9 % IV SOLN: INTRAVENOUS | Status: DC

## 2023-04-07 MED ORDER — LEVETIRACETAM 500 MG PO TABS
500.0000 mg | ORAL_TABLET | Freq: Two times a day (BID) | ORAL | Status: DC
  Administered 2023-04-07 – 2023-04-11 (×7): 500 mg via ORAL
  Filled 2023-04-07 (×8): qty 1

## 2023-04-07 MED ORDER — PANTOPRAZOLE SODIUM 40 MG PO TBEC
40.0000 mg | DELAYED_RELEASE_TABLET | Freq: Every day | ORAL | Status: DC
  Administered 2023-04-08 – 2023-04-11 (×4): 40 mg via ORAL
  Filled 2023-04-07 (×4): qty 1

## 2023-04-07 MED ORDER — ONDANSETRON HCL 4 MG/2ML IJ SOLN: 4.0000 mg | Freq: Four times a day (QID) | INTRAMUSCULAR | Status: DC | PRN

## 2023-04-07 MED ORDER — SODIUM CHLORIDE 0.9 % IV SOLN: Freq: Once | INTRAVENOUS | Status: AC

## 2023-04-07 MED ORDER — SODIUM CHLORIDE 0.9% FLUSH
3.0000 mL | Freq: Two times a day (BID) | INTRAVENOUS | Status: DC
  Administered 2023-04-08 – 2023-04-10 (×3): 3 mL via INTRAVENOUS

## 2023-04-07 MED ORDER — ONDANSETRON HCL 4 MG PO TABS: 4.0000 mg | ORAL_TABLET | Freq: Four times a day (QID) | ORAL | Status: DC | PRN

## 2023-04-07 MED ORDER — TROLAMINE SALICYLATE 10 % EX CREA: TOPICAL_CREAM | Freq: Two times a day (BID) | CUTANEOUS | Status: DC | PRN

## 2023-04-07 MED ORDER — ASPIRIN 81 MG PO CHEW
81.0000 mg | CHEWABLE_TABLET | Freq: Every day | ORAL | Status: DC
  Administered 2023-04-08 – 2023-04-11 (×4): 81 mg via ORAL
  Filled 2023-04-07 (×3): qty 1

## 2023-04-07 MED ORDER — ACETAMINOPHEN 325 MG PO TABS
650.0000 mg | ORAL_TABLET | Freq: Four times a day (QID) | ORAL | Status: DC | PRN
  Administered 2023-04-08 (×2): 650 mg via ORAL
  Filled 2023-04-07 (×2): qty 2

## 2023-04-07 MED ORDER — LACTATED RINGERS IV SOLN: INTRAVENOUS | Status: DC

## 2023-04-07 MED ORDER — ACETAMINOPHEN 650 MG RE SUPP: 650.0000 mg | Freq: Four times a day (QID) | RECTAL | Status: DC | PRN

## 2023-04-07 MED ORDER — SODIUM CHLORIDE 0.9 % IV SOLN
2.0000 g | INTRAVENOUS | Status: DC
  Administered 2023-04-07: 2 g via INTRAVENOUS
  Filled 2023-04-07: qty 20

## 2023-04-07 MED ORDER — MUSCLE RUB 10-15 % EX CREA: TOPICAL_CREAM | Freq: Two times a day (BID) | CUTANEOUS | Status: DC | PRN

## 2023-04-07 MED ORDER — SENNA 8.6 MG PO TABS: 1.0000 | ORAL_TABLET | Freq: Every day | ORAL | Status: DC | PRN

## 2023-04-07 NOTE — ED Notes (Signed)
ED TO INPATIENT HANDOFF REPORT  ED Nurse Name and Phone #: Crist Infante, RN [phone]  S Name/Age/Gender Rose Bridges 62 y.o. female Room/Bed: WA16/WA16  Code Status   Code Status: Prior  Home/SNF/Other Home Patient oriented to: self Is this baseline? No   Triage Complete: Triage complete  Chief Complaint Acute encephalopathy [G93.40]  Triage Note Patient BIB [facility] from [facility]. Over the last day, change in mentation, poor oral intake. Irritable. EMS arrived, patient responsive to only painful stimuli.   EMS 130 Heart rate 128/86 CBG 133 90% room air 18G left hand normal saline   Allergies Allergies  Allergen Reactions   Codeine Other (See Comments)    Migraine     Level of Care/Admitting Diagnosis ED Disposition     ED Disposition  Admit   Condition  --   Comment  Hospital Area: [facility] [[id number]]  Level of Care: Telemetry [5]  Admit to tele based on following criteria: Monitor QTC interval  May place patient in observation at [facility] or [facility] if equivalent level of care is available:: Yes  Covid Evaluation: Confirmed COVID Negative  Diagnosis: Acute encephalopathy [191478]  Admitting Physician: Briscoe Deutscher [[id number]]  Attending Physician: Briscoe Deutscher [[id number]]          B Medical/Surgery History 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[facility])    Metabolic encephalopathy    Osteogenesis imperfecta    Osteogenesis imperfecta    Osteoporosis    Personality disorder (HCC)    PVD (peripheral vascular disease) (HCC)    Stroke [facility])    Past Surgical  History:  Procedure Laterality Date   ABDOMINAL HYSTERECTOMY     BIOPSY  07/05/2022   Procedure: BIOPSY;  Surgeon: Lemar Lofty., MD;  Location: [facility];  Service: Gastroenterology;;   ESOPHAGOGASTRODUODENOSCOPY N/A 07/05/2022   Procedure: ESOPHAGOGASTRODUODENOSCOPY (EGD);  Surgeon: Lemar Lofty., MD;  Location: [facility];  Service: Gastroenterology;  Laterality: N/A;   EUS N/A 07/05/2022   Procedure: UPPER ENDOSCOPIC ULTRASOUND (EUS) LINEAR;  Surgeon: Lemar Lofty., MD;  Location: [facility];  Service: Gastroenterology;  Laterality: N/A;   HIP ARTHROPLASTY Left 09/26/2015   Procedure: ARTHROPLASTY BIPOLAR HIP (HEMIARTHROPLASTY);  Surgeon: Sheral Apley, MD;  Location: [facility] OR;  Service: Orthopedics;  Laterality: Left;   ORIF HUMERUS FRACTURE Right 11/06/2016   Procedure: OPEN REDUCTION INTERNAL FIXATION (ORIF) PROXIMAL HUMERUS FRACTURE;  Surgeon: Sheral Apley, MD;  Location: [facility] OR;  Service: Orthopedics;  Laterality: Right;     A IV Location/Drains/Wounds Patient Lines/Drains/Airways Status     Active Line/Drains/Airways     Name Placement date Placement time Site Days   Peripheral IV 04/07/23 18 G Left;Posterior Hand 04/07/23  1426  Hand  less than 1   Peripheral IV 04/07/23 20 G Anterior;Proximal;Right Forearm 04/07/23  1426  Forearm  less than 1            Intake/Output Last 24 hours No intake or output data in the 24 hours ending 04/07/23 1543  Labs/Imaging Results for orders placed or performed during the hospital  encounter of 04/07/23 (from the past 48 hour(s))  Resp panel by RT-PCR (RSV, Flu A&B, Covid) Anterior Nasal Swab     Status: None   Collection Time: 04/07/23  2:24 PM   Specimen: Anterior Nasal Swab  Result Value Ref Range   SARS Coronavirus 2 by RT PCR NEGATIVE NEGATIVE    Comment: (NOTE) SARS-CoV-2 target nucleic acids are NOT DETECTED.  The SARS-CoV-2 RNA is generally detectable in upper respiratory specimens during  the acute phase of infection. The lowest concentration of SARS-CoV-2 viral copies this assay can detect is 138 copies/mL. A negative result does not preclude SARS-Cov-2 infection and should not be used as the sole basis for treatment or other patient management decisions. A negative result may occur with  improper specimen collection/handling, submission of specimen other than nasopharyngeal swab, presence of viral mutation(s) within the areas targeted by this assay, and inadequate number of viral copies(<138 copies/mL). A negative result must be combined with clinical observations, patient history, and epidemiological information. The expected result is Negative.  Fact Sheet for Patients:  BloggerCourse.com  Fact Sheet for Healthcare Providers:  SeriousBroker.it  This test is no t yet approved or cleared by the [place] FDA and  has been authorized for detection and/or diagnosis of SARS-CoV-2 by FDA under an Emergency Use Authorization (EUA). This EUA will remain  in effect (meaning this test can be used) for the duration of the COVID-19 declaration under Section 564(b)(1) of the Act, 21 U.S.C.section 360bbb-3(b)(1), unless the authorization is terminated  or revoked sooner.       Influenza A by PCR NEGATIVE NEGATIVE   Influenza B by PCR NEGATIVE NEGATIVE    Comment: (NOTE) The Xpert Xpress SARS-CoV-2/FLU/RSV plus assay is intended as an aid in the diagnosis of influenza from Nasopharyngeal swab specimens and should not be used as a sole basis for treatment. Nasal washings and aspirates are unacceptable for Xpert Xpress SARS-CoV-2/FLU/RSV testing.  Fact Sheet for Patients: BloggerCourse.com  Fact Sheet for Healthcare Providers: SeriousBroker.it  This test is not yet approved or cleared by the [place] FDA and has been authorized for detection and/or diagnosis of  SARS-CoV-2 by FDA under an Emergency Use Authorization (EUA). This EUA will remain in effect (meaning this test can be used) for the duration of the COVID-19 declaration under Section 564(b)(1) of the Act, 21 U.S.C. section 360bbb-3(b)(1), unless the authorization is terminated or revoked.     Resp Syncytial Virus by PCR NEGATIVE NEGATIVE    Comment: (NOTE) Fact Sheet for Patients: BloggerCourse.com  Fact Sheet for Healthcare Providers: SeriousBroker.it  This test is not yet approved or cleared by the [place] FDA and has been authorized for detection and/or diagnosis of SARS-CoV-2 by FDA under an Emergency Use Authorization (EUA). This EUA will remain in effect (meaning this test can be used) for the duration of the COVID-19 declaration under Section 564(b)(1) of the Act, 21 U.S.C. section 360bbb-3(b)(1), unless the authorization is terminated or revoked.  Performed at [facility], [address], [place], [place] [address]   Comprehensive metabolic panel     Status: Abnormal   Collection Time: 04/07/23  2:24 PM  Result Value Ref Range   Sodium 144 135 - 145 mmol/L   Potassium 3.6 3.5 - 5.1 mmol/L   Chloride 108 98 - 111 mmol/L   CO2 20 (L) 22 - 32 mmol/L   Glucose, Bld 141 (H) 70 - 99 mg/dL    Comment: Glucose reference range applies only to  samples taken after fasting for at least 8 hours.   BUN 51 (H) 8 - 23 mg/dL   Creatinine, Ser 7.84 (H) 0.44 - 1.00 mg/dL   Calcium 9.2 8.9 - 69.6 mg/dL   Total Protein 7.4 6.5 - 8.1 g/dL   Albumin 3.8 3.5 - 5.0 g/dL   AST 15 15 - 41 U/L   ALT 12 0 - 44 U/L   Alkaline Phosphatase 68 38 - 126 U/L   Total Bilirubin 1.0 <1.2 mg/dL   GFR, Estimated 41 (L) >60 mL/min    Comment: (NOTE) Calculated using the CKD-EPI Creatinine Equation (2021)    Anion gap 16 (H) 5 - 15    Comment: Performed at [facility], [address], [place], [place]  [address]  CBC with Differential     Status: Abnormal   Collection Time: 04/07/23  2:24 PM  Result Value Ref Range   WBC 8.1 4.0 - 10.5 K/uL   RBC 4.79 3.87 - 5.11 MIL/uL   Hemoglobin 15.2 (H) 12.0 - 15.0 g/dL   HCT 41.3 (H) 24.4 - 01.0 %   MCV 98.1 80.0 - 100.0 fL   MCH 31.7 26.0 - 34.0 pg   MCHC 32.3 30.0 - 36.0 g/dL   RDW 27.2 53.6 - 64.4 %   Platelets 255 150 - 400 K/uL   nRBC 0.0 0.0 - 0.2 %   Neutrophils Relative % 70 %   Neutro Abs 5.6 1.7 - 7.7 K/uL   Lymphocytes Relative 25 %   Lymphs Abs 2.0 0.7 - 4.0 K/uL   Monocytes Relative 5 %   Monocytes Absolute 0.4 0.1 - 1.0 K/uL   Eosinophils Relative 0 %   Eosinophils Absolute 0.0 0.0 - 0.5 K/uL   Basophils Relative 0 %   Basophils Absolute 0.0 0.0 - 0.1 K/uL   Immature Granulocytes 0 %   Abs Immature Granulocytes 0.02 0.00 - 0.07 K/uL    Comment: Performed at [facility], [address] [place], [place] [address]  Protime-INR     Status: None   Collection Time: 04/07/23  2:24 PM  Result Value Ref Range   Prothrombin Time 13.3 11.4 - 15.2 seconds   INR 1.0 0.8 - 1.2    Comment: (NOTE) INR goal varies based on device and disease states. Performed at [facility], [address], [place], [place] [address]   APTT     Status: None   Collection Time: 04/07/23  2:24 PM  Result Value Ref Range   aPTT 26 24 - 36 seconds    Comment: Performed at [facility], [address] [place], [place] [address]  Urinalysis, w/ Reflex to Culture (Infection Suspected) -Urine, Clean Catch     Status: Abnormal   Collection Time: 04/07/23  2:45 PM  Result Value Ref Range   Specimen Source URINE, CLEAN CATCH    Color, Urine YELLOW YELLOW   APPearance TURBID (A) CLEAR   Specific Gravity, Urine 1.017 1.005 - 1.030   pH 5.0 5.0 - 8.0   Glucose, UA NEGATIVE NEGATIVE mg/dL   Hgb urine dipstick MODERATE (A) NEGATIVE   Bilirubin Urine NEGATIVE NEGATIVE   Ketones, ur 20 (A) NEGATIVE mg/dL    Protein, ur 30 (A) NEGATIVE mg/dL   Nitrite NEGATIVE NEGATIVE   Leukocytes,Ua MODERATE (A) NEGATIVE   RBC / HPF >50 0 - 5 RBC/hpf   WBC, UA >50 0 - 5 WBC/hpf    Comment:  Reflex urine culture not performed if WBC <=10, OR if Squamous epithelial cells >5. If Squamous epithelial cells >5 suggest recollection.    Bacteria, UA MANY (A) NONE SEEN   Squamous Epithelial / HPF 0-5 0 - 5 /HPF   Mucus PRESENT    Hyaline Casts, UA PRESENT     Comment: Performed at [facility], [address], [place], [place] [address]  I-Stat Lactic Acid, ED     Status: None   Collection Time: 04/07/23  2:52 PM  Result Value Ref Range   Lactic Acid, Venous 1.8 0.5 - 1.9 mmol/L   DG Chest Port 1 View  Result Date: 04/07/2023 CLINICAL DATA:  Evaluate for sepsis. EXAM: PORTABLE CHEST 1 VIEW COMPARISON:  03/29/2023 FINDINGS: The heart size and mediastinal contours are within normal limits. Both lungs are clear. Postoperative changes involving the right humerus. Dystrophic calcifications noted within both breasts. IMPRESSION: No active disease. Electronically Signed   By: Signa Kell M.D.   On: 04/07/2023 15:06    Pending Labs Unresulted Labs (From admission, onward)     Start     Ordered   04/07/23 1445  Urine Culture  Once,   R        04/07/23 1445   04/07/23 1437  Blood Culture (routine x 2)  (Septic presentation on arrival (screening labs, nursing and treatment orders for obvious sepsis))  BLOOD CULTURE X 2,   STAT      04/07/23 1436            Vitals/Pain Today's Vitals   04/07/23 1421 04/07/23 1430  BP: (!) 128/100   Pulse: (!) 114   Resp: (!) 24   Temp: 99.2 F (37.3 C)   TempSrc: Rectal   SpO2: 98%   Weight: 58 kg   Height: 5\' 4"  (1.626 m)   PainSc: 0-No pain 4     Isolation Precautions No active isolations  Medications Medications  cefTRIAXone (ROCEPHIN) 2 g in sodium chloride 0.9 % 100 mL IVPB (2 g Intravenous New Bag/Given 04/07/23 1519)  0.9 %   sodium chloride infusion (has no administration in time range)  0.9 %  sodium chloride infusion ( Intravenous New Bag/Given 04/07/23 1445)    Mobility non-ambulatory     Focused Assessments Neuro Assessment Handoff:  Swallow screen pass?  N/A         Neuro Assessment:   Neuro Checks:      Has TPA been given? No If patient is a Neuro Trauma and patient is going to OR before floor call report to 4N Charge nurse: [phone] or [phone]   R Recommendations: See Admitting Provider Note  Report given to:   Additional Notes:

## 2023-04-07 NOTE — ED Notes (Deleted)
ED TO INPATIENT HANDOFF REPORT  Name/Age/Gender Rose Bridges 62 y.o. female  Code Status    Code Status Orders  (From admission, onward)           Start     Ordered   04/07/23 1547  Full code  Continuous       Question:  By:  Answer:  Consent: discussion documented in EHR   04/07/23 1549           Code Status History     Date Active Date Inactive Code Status Order ID Comments User Context   05/22/2022 1057 05/25/2022 2039 Full Code [id number]  Jonah Blue, MD Inpatient   05/10/2022 2019 05/19/2022 0138 Full Code [id number]  Darlin Drop, DO ED   04/03/2022 2349 04/08/2022 0020 Full Code [id number]  Elberta Fortis, MD ED   04/19/2018 1713 05/03/2018 0042 Full Code [id number]  Mariea Clonts, Courage, MD ED   11/05/2016 2331 11/09/2016 2048 Full Code [id number]  Hillary Bow, DO ED   09/25/2015 1010 09/29/2015 1552 Full Code [id number]  Lora Paula, MD ED      Advance Directive Documentation    Flowsheet Row Most Recent Value  Type of Advance Directive Out of facility DNR (pink MOST or yellow form)  Pre-existing out of facility DNR order (yellow form or pink MOST form) --  "MOST" Form in Place? --       Home/SNF/Other Rehab  Chief Complaint Acute encephalopathy [G93.40]  Level of Care/Admitting Diagnosis ED Disposition     ED Disposition  Admit   Condition  --   Comment  Hospital Area[facility] [[id number]]  Level of Care: Telemetry [5]  Admit to tele based on following criteria: Monitor QTC interval  May place patient in observation at [facility] or [facility] if equivalent level of care is available:: Yes  Covid Evaluation: Confirmed COVID Negative  Diagnosis: Acute encephalopathy [517616]  Admitting Physician: Briscoe Deutscher [[id number]]  Attending Physician: Briscoe Deutscher [[id number]]          Medical History Past Medical History:  Diagnosis Date   Agoraphobia    Arthritis    Bipolar disorder (HCC)    Degeneration of  lumbar intervertebral disc    Disseminated intravascular coagulation (HCC)    Epilepsy (HCC)    Fracture of unspecified parts of lumbosacral spine and pelvis, sequela    History of falling    Hyperlipemia    Hypertension    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[facility])    Metabolic encephalopathy    Osteogenesis imperfecta    Osteogenesis imperfecta    Osteoporosis    Personality disorder (HCC)    PVD (peripheral vascular disease) (HCC)    Stroke (HCC)     Allergies Allergies  Allergen Reactions   Codeine Other (See Comments)    Migraine     IV Location/Drains/Wounds Patient Lines/Drains/Airways Status     Active Line/Drains/Airways     Name Placement date Placement time Site Days   Peripheral IV 04/07/23 18 G Left;Posterior Hand 04/07/23  1426  Hand  less than 1   Peripheral IV 04/07/23 20 G Anterior;Proximal;Right Forearm 04/07/23  1426  Forearm  less than 1            Labs/Imaging Results for orders placed or performed during the hospital encounter of 04/07/23 (from the past 48 hour(s))  Resp panel by RT-PCR (RSV, Flu A&B, Covid) Anterior Nasal Swab     Status: None   Collection Time:  04/07/23  2:24 PM   Specimen: Anterior Nasal Swab  Result Value Ref Range   SARS Coronavirus 2 by RT PCR NEGATIVE NEGATIVE    Comment: (NOTE) SARS-CoV-2 target nucleic acids are NOT DETECTED.  The SARS-CoV-2 RNA is generally detectable in upper respiratory specimens during the acute phase of infection. The lowest concentration of SARS-CoV-2 viral copies this assay can detect is 138 copies/mL. A negative result does not preclude SARS-Cov-2 infection and should not be used as the sole basis for treatment or other patient management decisions. A negative result may occur with  improper specimen collection/handling, submission of specimen other than nasopharyngeal swab, presence of viral mutation(s) within the areas targeted by this assay, and inadequate number of  viral copies(<138 copies/mL). A negative result must be combined with clinical observations, patient history, and epidemiological information. The expected result is Negative.  Fact Sheet for Patients:  BloggerCourse.com  Fact Sheet for Healthcare Providers:  SeriousBroker.it  This test is no t yet approved or cleared by the [place] FDA and  has been authorized for detection and/or diagnosis of SARS-CoV-2 by FDA under an Emergency Use Authorization (EUA). This EUA will remain  in effect (meaning this test can be used) for the duration of the COVID-19 declaration under Section 564(b)(1) of the Act, 21 U.S.C.section 360bbb-3(b)(1), unless the authorization is terminated  or revoked sooner.       Influenza A by PCR NEGATIVE NEGATIVE   Influenza B by PCR NEGATIVE NEGATIVE    Comment: (NOTE) The Xpert Xpress SARS-CoV-2/FLU/RSV plus assay is intended as an aid in the diagnosis of influenza from Nasopharyngeal swab specimens and should not be used as a sole basis for treatment. Nasal washings and aspirates are unacceptable for Xpert Xpress SARS-CoV-2/FLU/RSV testing.  Fact Sheet for Patients: BloggerCourse.com  Fact Sheet for Healthcare Providers: SeriousBroker.it  This test is not yet approved or cleared by the [place] FDA and has been authorized for detection and/or diagnosis of SARS-CoV-2 by FDA under an Emergency Use Authorization (EUA). This EUA will remain in effect (meaning this test can be used) for the duration of the COVID-19 declaration under Section 564(b)(1) of the Act, 21 U.S.C. section 360bbb-3(b)(1), unless the authorization is terminated or revoked.     Resp Syncytial Virus by PCR NEGATIVE NEGATIVE    Comment: (NOTE) Fact Sheet for Patients: BloggerCourse.com  Fact Sheet for Healthcare  Providers: SeriousBroker.it  This test is not yet approved or cleared by the [place] FDA and has been authorized for detection and/or diagnosis of SARS-CoV-2 by FDA under an Emergency Use Authorization (EUA). This EUA will remain in effect (meaning this test can be used) for the duration of the COVID-19 declaration under Section 564(b)(1) of the Act, 21 U.S.C. section 360bbb-3(b)(1), unless the authorization is terminated or revoked.  Performed at [facility], [address], [place], [place] [address]   Comprehensive metabolic panel     Status: Abnormal   Collection Time: 04/07/23  2:24 PM  Result Value Ref Range   Sodium 144 135 - 145 mmol/L   Potassium 3.6 3.5 - 5.1 mmol/L   Chloride 108 98 - 111 mmol/L   CO2 20 (L) 22 - 32 mmol/L   Glucose, Bld 141 (H) 70 - 99 mg/dL    Comment: Glucose reference range applies only to samples taken after fasting for at least 8 hours.   BUN 51 (H) 8 - 23 mg/dL   Creatinine, Ser 1.91 (H) 0.44 - 1.00 mg/dL  Calcium 9.2 8.9 - 10.3 mg/dL   Total Protein 7.4 6.5 - 8.1 g/dL   Albumin 3.8 3.5 - 5.0 g/dL   AST 15 15 - 41 U/L   ALT 12 0 - 44 U/L   Alkaline Phosphatase 68 38 - 126 U/L   Total Bilirubin 1.0 <1.2 mg/dL   GFR, Estimated 41 (L) >60 mL/min    Comment: (NOTE) Calculated using the CKD-EPI Creatinine Equation (2021)    Anion gap 16 (H) 5 - 15    Comment: Performed at [facility] [address], [place], [place] [address]  CBC with Differential     Status: Abnormal   Collection Time: 04/07/23  2:24 PM  Result Value Ref Range   WBC 8.1 4.0 - 10.5 K/uL   RBC 4.79 3.87 - 5.11 MIL/uL   Hemoglobin 15.2 (H) 12.0 - 15.0 g/dL   HCT 60.4 (H) 54.0 - 98.1 %   MCV 98.1 80.0 - 100.0 fL   MCH 31.7 26.0 - 34.0 pg   MCHC 32.3 30.0 - 36.0 g/dL   RDW 19.1 47.8 - 29.5 %   Platelets 255 150 - 400 K/uL   nRBC 0.0 0.0 - 0.2 %   Neutrophils Relative % 70 %   Neutro Abs 5.6 1.7 - 7.7  K/uL   Lymphocytes Relative 25 %   Lymphs Abs 2.0 0.7 - 4.0 K/uL   Monocytes Relative 5 %   Monocytes Absolute 0.4 0.1 - 1.0 K/uL   Eosinophils Relative 0 %   Eosinophils Absolute 0.0 0.0 - 0.5 K/uL   Basophils Relative 0 %   Basophils Absolute 0.0 0.0 - 0.1 K/uL   Immature Granulocytes 0 %   Abs Immature Granulocytes 0.02 0.00 - 0.07 K/uL    Comment: Performed at [facility], [address], [place], [place] [address]  Protime-INR     Status: None   Collection Time: 04/07/23  2:24 PM  Result Value Ref Range   Prothrombin Time 13.3 11.4 - 15.2 seconds   INR 1.0 0.8 - 1.2    Comment: (NOTE) INR goal varies based on device and disease states. Performed at [facility], [address], [place], [place] [address]   APTT     Status: None   Collection Time: 04/07/23  2:24 PM  Result Value Ref Range   aPTT 26 24 - 36 seconds    Comment: Performed at [facility], [address], [place], [place] [address]  Urinalysis, w/ Reflex to Culture (Infection Suspected) -Urine, Clean Catch     Status: Abnormal   Collection Time: 04/07/23  2:45 PM  Result Value Ref Range   Specimen Source URINE, CLEAN CATCH    Color, Urine YELLOW YELLOW   APPearance TURBID (A) CLEAR   Specific Gravity, Urine 1.017 1.005 - 1.030   pH 5.0 5.0 - 8.0   Glucose, UA NEGATIVE NEGATIVE mg/dL   Hgb urine dipstick MODERATE (A) NEGATIVE   Bilirubin Urine NEGATIVE NEGATIVE   Ketones, ur 20 (A) NEGATIVE mg/dL   Protein, ur 30 (A) NEGATIVE mg/dL   Nitrite NEGATIVE NEGATIVE   Leukocytes,Ua MODERATE (A) NEGATIVE   RBC / HPF >50 0 - 5 RBC/hpf   WBC, UA >50 0 - 5 WBC/hpf    Comment:        Reflex urine culture not performed if WBC <=10, OR if Squamous epithelial cells >5. If Squamous epithelial cells >5 suggest recollection.    Bacteria, UA MANY (A)  NONE SEEN   Squamous Epithelial / HPF 0-5 0 - 5 /HPF   Mucus PRESENT    Hyaline Casts, UA PRESENT     Comment:  Performed at [facility], [address], [place], [place] [address]  I-Stat Lactic Acid, ED     Status: None   Collection Time: 04/07/23  2:52 PM  Result Value Ref Range   Lactic Acid, Venous 1.8 0.5 - 1.9 mmol/L   DG Chest Port 1 View  Result Date: 04/07/2023 CLINICAL DATA:  Evaluate for sepsis. EXAM: PORTABLE CHEST 1 VIEW COMPARISON:  03/29/2023 FINDINGS: The heart size and mediastinal contours are within normal limits. Both lungs are clear. Postoperative changes involving the right humerus. Dystrophic calcifications noted within both breasts. IMPRESSION: No active disease. Electronically Signed   By: Signa Kell M.D.   On: 04/07/2023 15:06    Pending Labs Unresulted Labs (From admission, onward)     Start     Ordered   04/14/23 0500  Creatinine, serum  (enoxaparin (LOVENOX)    CrCl >/= 30 ml/min)  Weekly,   R     Comments: while on enoxaparin therapy    04/07/23 1549   04/08/23 0500  HIV Antibody (routine testing w rflx)  (HIV Antibody (Routine testing w reflex) panel)  Tomorrow morning,   R        04/07/23 1549   04/08/23 0500  Basic metabolic panel  Daily,   R      04/07/23 1549   04/08/23 0500  Magnesium  Tomorrow morning,   R        04/07/23 1549   04/08/23 0500  Phosphorus  Tomorrow morning,   R        04/07/23 1549   04/08/23 0500  CBC  Daily,   R      04/07/23 1549   04/07/23 1445  Urine Culture  Once,   R        04/07/23 1445   04/07/23 1437  Blood Culture (routine x 2)  (Septic presentation on arrival (screening labs, nursing and treatment orders for obvious sepsis))  BLOOD CULTURE X 2,   STAT      04/07/23 1436            Vitals/Pain Today's Vitals   04/07/23 1421 04/07/23 1430  BP: (!) 128/100   Pulse: (!) 114   Resp: (!) 24   Temp: 99.2 F (37.3 C)   TempSrc: Rectal   SpO2: 98%   Weight: 58 kg   Height: 5\' 4"  (1.626 m)   PainSc: 0-No pain 4     Isolation Precautions No active isolations  Medications Medications  0.9 %   sodium chloride infusion ( Intravenous New Bag/Given 04/07/23 1601)  pantoprazole (PROTONIX) EC tablet 40 mg (has no administration in time range)  levETIRAcetam (KEPPRA) tablet 500 mg (has no administration in time range)  enoxaparin (LOVENOX) injection 40 mg (has no administration in time range)  cefTRIAXone (ROCEPHIN) 1 g in sodium chloride 0.9 % 100 mL IVPB (has no administration in time range)  sodium chloride flush (NS) 0.9 % injection 3 mL (has no administration in time range)  acetaminophen (TYLENOL) tablet 650 mg (has no administration in time range)    Or  acetaminophen (TYLENOL) suppository 650 mg (has no administration in time range)  senna (SENOKOT) tablet 8.6 mg (has no administration in time range)  ondansetron (ZOFRAN) tablet 4 mg (has no administration in time range)    Or  ondansetron (  ZOFRAN) injection 4 mg (has no administration in time range)  Muscle Rub CREA (has no administration in time range)  aspirin chewable tablet 81 mg (has no administration in time range)  0.9 %  sodium chloride infusion (0 mLs Intravenous Stopped 04/07/23 1548)    Mobility non-ambulatory

## 2023-04-07 NOTE — ED Triage Notes (Addendum)
Patient BIB [facility] from [facility]. Over the last day, change in mentation, poor oral intake. Irritable. EMS arrived, patient responsive to only painful stimuli.   EMS 130 Heart rate 128/86 CBG 133 90% room air 18G left hand normal saline

## 2023-04-07 NOTE — H&P (Addendum)
History and Physical    Rose Bridges WGN:[id number] DOB: 1960/07/29 DOA: 04/07/2023  PCP: Lyman Speller   Patient coming from: SNF  Chief Complaint: AMS   HPI: Rose Bridges is a 62 y.o. female with medical history significant for hypertension, hyperlipidemia, CVA, alcohol abuse, depression, anxiety, and chronic pain who presents with altered mental status.  Patient is unable to provide any history at time of admission.  History is obtained primarily from SNF personnel and EMS.    Patient is said to be interactive but irritable at her baseline. She had decrease in her oral intake in recent days and now 1 day of decreased level of consciousness.  EMS was called and found the patient to be responsive to painful stimuli with tachycardia, normal heart rate, stable blood pressure, and oxygen saturation in the mid 90s on room air.  ED Course: Upon arrival to the ED, patient is found to be afebrile and saturating well on 2 L/min of supplemental oxygen with tachypnea, tachycardia, and stable blood pressure.  Labs are most notable for BUN 51, creatinine 1.43, normal lactic acid, normal WBC, and negative respiratory virus panel.  Urinalysis notable for bacteriuria and pyuria.  Blood and urine culture were collected in the ED and the patient was treated with Rocephin and IV fluids.  Review of Systems:  Unable to complete ROS due to patient's clinical condition.  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[facility])    Metabolic encephalopathy    Osteogenesis imperfecta    Osteogenesis imperfecta    Osteoporosis    Personality disorder (HCC)    PVD  (peripheral vascular disease) (HCC)    Stroke [facility])     Past Surgical History:  Procedure Laterality Date   ABDOMINAL HYSTERECTOMY     BIOPSY  07/05/2022   Procedure: BIOPSY;  Surgeon: Lemar Lofty., MD;  Location: [facility];  Service: Gastroenterology;;   ESOPHAGOGASTRODUODENOSCOPY N/A 07/05/2022   Procedure: ESOPHAGOGASTRODUODENOSCOPY (EGD);  Surgeon: Lemar Lofty., MD;  Location: [facility];  Service: Gastroenterology;  Laterality: N/A;   EUS N/A 07/05/2022   Procedure: UPPER ENDOSCOPIC ULTRASOUND (EUS) LINEAR;  Surgeon: Lemar Lofty., MD;  Location: [facility];  Service: Gastroenterology;  Laterality: N/A;   HIP ARTHROPLASTY Left 09/26/2015   Procedure: ARTHROPLASTY BIPOLAR HIP (HEMIARTHROPLASTY);  Surgeon: Sheral Apley, MD;  Location: [facility] OR;  Service: Orthopedics;  Laterality: Left;   ORIF HUMERUS FRACTURE Right 11/06/2016   Procedure: OPEN REDUCTION INTERNAL FIXATION (ORIF) PROXIMAL HUMERUS FRACTURE;  Surgeon: Sheral Apley, MD;  Location: [facility] OR;  Service: Orthopedics;  Laterality: Right;    Social History:   reports that she quit smoking about 4 years ago. Her smoking use included cigarettes. She has never used smokeless tobacco. She reports that she does not drink alcohol and does not use drugs.  Allergies  Allergen Reactions   Codeine Other (See Comments)    Migraine     Family History  Problem Relation Age of Onset   Stroke Mother    Heart attack Mother    Hypertension Mother    Stroke Sister    Stroke Brother  Lung cancer Father      Prior to Admission medications   Medication Sig Start Date End Date Taking? Authorizing Provider  aspirin 81 MG chewable tablet Chew by mouth daily.    [provider]  Baclofen 5 MG TABS Take 1 tablet by mouth every 8 (eight) hours as needed for muscle spasms. 04/07/22   Celine Mans, MD  calcitonin, salmon, (MIACALCIN/FORTICAL) 200 UNIT/ACT nasal spray Place 1 spray into alternate  nostrils daily. 04/08/22   Celine Mans, MD  calcium carbonate (TUMS - DOSED IN MG ELEMENTAL CALCIUM) 500 MG chewable tablet Chew 1 tablet by mouth 2 (two) times daily.    [provider]  cephALEXin (KEFLEX) 500 MG capsule Take 1 capsule (500 mg total) by mouth 4 (four) times daily. 11/13/22   Mannie Stabile, PA-C  cyclobenzaprine (FLEXERIL) 10 MG tablet Take 1 tablet (10 mg total) by mouth 2 (two) times daily as needed for muscle spasms. 03/02/23   Elayne Snare K, DO  docusate sodium (COLACE) 100 MG capsule Take 100 mg by mouth 2 (two) times daily.    [provider]  FLUoxetine (PROZAC) 10 MG tablet Take 10 mg by mouth daily.    [provider]  folic acid (FOLVITE) 1 MG tablet Take 1 tablet (1 mg total) by mouth daily. 09/09/19   Storm Frisk, MD  gabapentin (NEURONTIN) 100 MG capsule Take 3 capsules (300 mg total) by mouth 3 (three) times daily. 02/12/23   Armbruster, Willaim Rayas, MD  hydrOXYzine (ATARAX) 10 MG tablet Take 1 tablet (10 mg total) by mouth 3 (three) times daily as needed for anxiety. 05/25/22   Noralee Stain, DO  levETIRAcetam (KEPPRA) 500 MG tablet Take 1 tablet (500 mg total) by mouth 2 (two) times daily. 05/18/22   Leatha Gilding, MD  magnesium gluconate (MAGONATE) 500 MG tablet Take 500 mg by mouth 2 (two) times daily.    [provider]  methocarbamol (ROBAXIN) 500 MG tablet Take 1 tablet (500 mg total) by mouth 2 (two) times daily. 02/20/23   Darrick Grinder, PA-C  oxyCODONE (ROXICODONE) 5 MG immediate release tablet Take 1 tablet (5 mg total) by mouth every 6 (six) hours. Patient taking differently: Take 5 mg by mouth See admin instructions. Every 10 hours 05/25/22   Noralee Stain, DO  oxyCODONE (ROXICODONE) 5 MG immediate release tablet Take 1 tablet (5 mg total) by mouth every 4 (four) hours as needed for severe pain or breakthrough pain. Can take tablet for breakthrough on top of her scheduled oxycodone 05/25/22   Noralee Stain, DO  pantoprazole (PROTONIX) 20 MG tablet Take 2 tablets (40 mg total) by mouth daily. 01/29/23   Armbruster, Willaim Rayas, MD  polyethylene glycol (MIRALAX / GLYCOLAX) 17 g packet Take 17 g by mouth daily.    [provider]  senna (SENOKOT) 8.6 MG TABS tablet Take 1 tablet by mouth.    [provider]  thiamine 100 MG tablet Take 1 tablet (100 mg total) by mouth daily. 09/09/19   Storm Frisk, MD  Vitamin D, Ergocalciferol, (DRISDOL) 1.25 MG (50000 UNIT) CAPS capsule Take 1 capsule (50,000 Units total) by mouth every 7 (seven) days. Patient taking differently: Take 50,000 Units by mouth every 7 (seven) days. Thursdays 04/12/22   Celine Mans, MD    Physical Exam: Vitals:   04/07/23 1421  BP: (!) 128/100  Pulse: (!) 114  Resp: (!) 24  Temp: 99.2 F (37.3 C)  TempSrc: Rectal  SpO2: 98%  Weight: 58 kg  Height: 5\' 4"  (1.626 m)    Constitutional: NAD, no pallor or diaphoresis   Eyes: PERTLA, lids and conjunctivae normal ENMT: Mucous membranes are dry. Posterior pharynx clear of any exudate or lesions.   Neck: supple, no masses  Respiratory: no wheezing, no crackles. No accessory muscle use.  Cardiovascular: S1 & S2 heard, regular rate and rhythm. No extremity edema.   Abdomen: No distension, no tenderness, soft. Bowel sounds active.  Musculoskeletal: no clubbing / cyanosis. No joint deformity upper and lower extremities.   Skin: no significant rashes, lesions, ulcers. Xerosis, poor turgor. Neurologic: CN 2-12 grossly intact. Moving all extremities. Awake, makes eye-contact, not speaking.    Labs and Imaging on Admission: I have personally reviewed following labs and imaging studies  CBC: Recent Labs  Lab 04/07/23 1424  WBC 8.1  NEUTROABS 5.6  HGB 15.2*  HCT 47.0*  MCV 98.1  PLT 255   Basic Metabolic Panel: Recent Labs  Lab 04/07/23 1424  NA 144  K 3.6  CL 108  CO2 20*  GLUCOSE 141*  BUN 51*  CREATININE 1.43*  CALCIUM 9.2    GFR: Estimated Creatinine Clearance: 34.8 mL/min (A) (by C-G formula based on SCr of 1.43 mg/dL (H)). Liver Function Tests: Recent Labs  Lab 04/07/23 1424  AST 15  ALT 12  ALKPHOS 68  BILITOT 1.0  PROT 7.4  ALBUMIN 3.8   No results for input(s): "LIPASE", "AMYLASE" in the last 168 hours. No results for input(s): "AMMONIA" in the last 168 hours. Coagulation Profile: Recent Labs  Lab 04/07/23 1424  INR 1.0   Cardiac Enzymes: No results for input(s): "CKTOTAL", "CKMB", "CKMBINDEX", "TROPONINI" in the last 168 hours. BNP (last 3 results) No results for input(s): "PROBNP" in the last 8760 hours. HbA1C: No results for input(s): "HGBA1C" in the last 72 hours. CBG: No results for input(s): "GLUCAP" in the last 168 hours. Lipid Profile: No results for input(s): "CHOL", "HDL", "LDLCALC", "TRIG", "CHOLHDL", "LDLDIRECT" in the last 72 hours. Thyroid Function Tests: No results for input(s): "TSH", "T4TOTAL", "FREET4", "T3FREE", "THYROIDAB" in the last 72 hours. Anemia Panel: No results for input(s): "VITAMINB12", "FOLATE", "FERRITIN", "TIBC", "IRON", "RETICCTPCT" in the last 72 hours. Urine analysis:    Component Value Date/Time   COLORURINE YELLOW 04/07/2023 1445   APPEARANCEUR TURBID (A) 04/07/2023 1445   LABSPEC 1.017 04/07/2023 1445   PHURINE 5.0 04/07/2023 1445   GLUCOSEU NEGATIVE 04/07/2023 1445   HGBUR MODERATE (A) 04/07/2023 1445   BILIRUBINUR NEGATIVE 04/07/2023 1445   KETONESUR 20 (A) 04/07/2023 1445   PROTEINUR 30 (A) 04/07/2023 1445   NITRITE NEGATIVE 04/07/2023 1445   LEUKOCYTESUR MODERATE (A) 04/07/2023 1445   Sepsis Labs: @LABRCNTIP (procalcitonin:4,lacticidven:4) ) Recent Results (from the past 240 hour(s))  Resp panel by RT-PCR (RSV, Flu A&B, Covid) Anterior Nasal Swab     Status: None   Collection Time: 04/07/23  2:24 PM   Specimen: Anterior Nasal Swab  Result Value Ref Range Status   SARS Coronavirus 2 by RT PCR NEGATIVE NEGATIVE Final    Comment:  (NOTE) SARS-CoV-2 target nucleic acids are NOT DETECTED.  The SARS-CoV-2 RNA is generally detectable in upper respiratory specimens during the acute phase of infection. The lowest concentration of SARS-CoV-2 viral copies this assay can detect is 138 copies/mL. A negative result does not preclude SARS-Cov-2 infection and should not be used as the sole basis for treatment or other patient management decisions. A negative result may occur with  improper specimen  collection/handling, submission of specimen other than nasopharyngeal swab, presence of viral mutation(s) within the areas targeted by this assay, and inadequate number of viral copies(<138 copies/mL). A negative result must be combined with clinical observations, patient history, and epidemiological information. The expected result is Negative.  Fact Sheet for Patients:  BloggerCourse.com  Fact Sheet for Healthcare Providers:  SeriousBroker.it  This test is no t yet approved or cleared by the [place] FDA and  has been authorized for detection and/or diagnosis of SARS-CoV-2 by FDA under an Emergency Use Authorization (EUA). This EUA will remain  in effect (meaning this test can be used) for the duration of the COVID-19 declaration under Section 564(b)(1) of the Act, 21 U.S.C.section 360bbb-3(b)(1), unless the authorization is terminated  or revoked sooner.       Influenza A by PCR NEGATIVE NEGATIVE Final   Influenza B by PCR NEGATIVE NEGATIVE Final    Comment: (NOTE) The Xpert Xpress SARS-CoV-2/FLU/RSV plus assay is intended as an aid in the diagnosis of influenza from Nasopharyngeal swab specimens and should not be used as a sole basis for treatment. Nasal washings and aspirates are unacceptable for Xpert Xpress SARS-CoV-2/FLU/RSV testing.  Fact Sheet for Patients: BloggerCourse.com  Fact Sheet for Healthcare  Providers: SeriousBroker.it  This test is not yet approved or cleared by the [place] FDA and has been authorized for detection and/or diagnosis of SARS-CoV-2 by FDA under an Emergency Use Authorization (EUA). This EUA will remain in effect (meaning this test can be used) for the duration of the COVID-19 declaration under Section 564(b)(1) of the Act, 21 U.S.C. section 360bbb-3(b)(1), unless the authorization is terminated or revoked.     Resp Syncytial Virus by PCR NEGATIVE NEGATIVE Final    Comment: (NOTE) Fact Sheet for Patients: BloggerCourse.com  Fact Sheet for Healthcare Providers: SeriousBroker.it  This test is not yet approved or cleared by the [place] FDA and has been authorized for detection and/or diagnosis of SARS-CoV-2 by FDA under an Emergency Use Authorization (EUA). This EUA will remain in effect (meaning this test can be used) for the duration of the COVID-19 declaration under Section 564(b)(1) of the Act, 21 U.S.C. section 360bbb-3(b)(1), unless the authorization is terminated or revoked.  Performed at [facility], [address], [place], [place] [address]      Radiological Exams on Admission: DG Chest Port 1 View  Result Date: 04/07/2023 CLINICAL DATA:  Evaluate for sepsis. EXAM: PORTABLE CHEST 1 VIEW COMPARISON:  03/29/2023 FINDINGS: The heart size and mediastinal contours are within normal limits. Both lungs are clear. Postoperative changes involving the right humerus. Dystrophic calcifications noted within both breasts. IMPRESSION: No active disease. Electronically Signed   By: Signa Kell M.D.   On: 04/07/2023 15:06    EKG: Independently reviewed. Sinus tachycardia, rate 118.   Assessment/Plan   1. Acute encephalopathy  - Likely secondary to dehydration, AKI, UTI, and/or polypharmacy  - Treat UTI with Rocephin, continue IVF hydration, hold  sedating medications, and expand workup if she fails to improve as expected    2. AKI  - SCr is 1.43 on admission, up from 0.92 a week ago  - Likely prerenal in setting of anorexia  - Continue IVF hydration, renally-dose medications, repeat chem panel in am   3. UTI  - Not septic on admission  - Continue Rocephin, follow cultures and clinical course    4. Depression, anxiety  - Hold sedating medications for now   5. Chronic pain  - Hold  sedating medications for now   6. Hx of CVA  - Continue ASA   7. Seizure disorder  - Continue Keppra    DVT prophylaxis: Lovenox  Code Status: Full  Level of Care: Level of care: Telemetry Family Communication: none present   Disposition Plan:  Patient is from: SNF  Anticipated d/c is to: SNF  Anticipated d/c date is: 12/9 or 04/09/23  Patient currently: Pending improved mental status and renal function  Consults called: None Admission status: Observation     Briscoe Deutscher, MD [facility]  04/07/2023, 3:50 PM

## 2023-04-07 NOTE — ED Provider Notes (Signed)
[place] EMERGENCY DEPARTMENT AT [facility] Provider Note   CSN: [id number] Arrival date & time: 04/07/23  1414     History  Chief Complaint  Patient presents with   Altered Mental Status    Rose Bridges is a 63 y.o. female.  62 year old female presents from [facility] due to decreased mental status over the past several days.  Has a history of metabolic encephalopathy.  When they arrived, patient only responsive to painful stimuli.  CBG was 133.  Was noted to have a heart rate of 133.  Patient felt warm to the touch.  Sent here for possible sepsis       Home Medications Prior to Admission medications   Medication Sig Start Date End Date Taking? Authorizing Provider  aspirin 81 MG chewable tablet Chew by mouth daily.    [provider]  Baclofen 5 MG TABS Take 1 tablet by mouth every 8 (eight) hours as needed for muscle spasms. 04/07/22   Celine Mans, MD  calcitonin, salmon, (MIACALCIN/FORTICAL) 200 UNIT/ACT nasal spray Place 1 spray into alternate nostrils daily. 04/08/22   Celine Mans, MD  calcium carbonate (TUMS - DOSED IN MG ELEMENTAL CALCIUM) 500 MG chewable tablet Chew 1 tablet by mouth 2 (two) times daily.    [provider]  cephALEXin (KEFLEX) 500 MG capsule Take 1 capsule (500 mg total) by mouth 4 (four) times daily. 11/13/22   Mannie Stabile, PA-C  cyclobenzaprine (FLEXERIL) 10 MG tablet Take 1 tablet (10 mg total) by mouth 2 (two) times daily as needed for muscle spasms. 03/02/23   Elayne Snare K, DO  docusate sodium (COLACE) 100 MG capsule Take 100 mg by mouth 2 (two) times daily.    [provider]  FLUoxetine (PROZAC) 10 MG tablet Take 10 mg by mouth daily.    [provider]  folic acid (FOLVITE) 1 MG tablet Take 1 tablet (1 mg total) by mouth daily. 09/09/19   Storm Frisk, MD  gabapentin (NEURONTIN) 100 MG capsule Take 3 capsules (300 mg total) by mouth 3 (three) times daily. 02/12/23    Armbruster, Willaim Rayas, MD  hydrOXYzine (ATARAX) 10 MG tablet Take 1 tablet (10 mg total) by mouth 3 (three) times daily as needed for anxiety. 05/25/22   Noralee Stain, DO  levETIRAcetam (KEPPRA) 500 MG tablet Take 1 tablet (500 mg total) by mouth 2 (two) times daily. 05/18/22   Leatha Gilding, MD  magnesium gluconate (MAGONATE) 500 MG tablet Take 500 mg by mouth 2 (two) times daily.    [provider]  methocarbamol (ROBAXIN) 500 MG tablet Take 1 tablet (500 mg total) by mouth 2 (two) times daily. 02/20/23   Darrick Grinder, PA-C  oxyCODONE (ROXICODONE) 5 MG immediate release tablet Take 1 tablet (5 mg total) by mouth every 6 (six) hours. Patient taking differently: Take 5 mg by mouth See admin instructions. Every 10 hours 05/25/22   Noralee Stain, DO  oxyCODONE (ROXICODONE) 5 MG immediate release tablet Take 1 tablet (5 mg total) by mouth every 4 (four) hours as needed for severe pain or breakthrough pain. Can take tablet for breakthrough on top of her scheduled oxycodone 05/25/22   Noralee Stain, DO  pantoprazole (PROTONIX) 20 MG tablet Take 2 tablets (40 mg total) by mouth daily. 01/29/23   Armbruster, Willaim Rayas, MD  polyethylene glycol (MIRALAX / GLYCOLAX) 17 g packet Take 17 g by mouth daily.    [provider]  senna (SENOKOT) 8.6 MG  TABS tablet Take 1 tablet by mouth.    [provider]  thiamine 100 MG tablet Take 1 tablet (100 mg total) by mouth daily. 09/09/19   Storm Frisk, MD  Vitamin D, Ergocalciferol, (DRISDOL) 1.25 MG (50000 UNIT) CAPS capsule Take 1 capsule (50,000 Units total) by mouth every 7 (seven) days. Patient taking differently: Take 50,000 Units by mouth every 7 (seven) days. Thursdays 04/12/22   Celine Mans, MD      Allergies    Codeine    Review of Systems   Review of Systems  Unable to perform ROS: Acuity of condition    Physical Exam Updated Vital Signs BP (!) 128/100   Pulse (!) 114   Resp (!) 24   Ht 1.626 m (5\' 4" )    Wt 58 kg   SpO2 98%   BMI 21.95 kg/m  Physical Exam Vitals and nursing note reviewed.  Constitutional:      General: She is not in acute distress.    Appearance: Normal appearance. She is well-developed. She is not toxic-appearing.  HENT:     Head: Normocephalic and atraumatic.  Eyes:     General: Lids are normal.     Conjunctiva/sclera: Conjunctivae normal.     Pupils: Pupils are equal, round, and reactive to light.  Neck:     Thyroid: No thyroid mass.     Trachea: No tracheal deviation.  Cardiovascular:     Rate and Rhythm: Regular rhythm. Tachycardia present.     Heart sounds:     No gallop.  Pulmonary:     Effort: Pulmonary effort is normal. No respiratory distress.     Breath sounds: Normal breath sounds. No stridor. No decreased breath sounds, wheezing, rhonchi or rales.  Abdominal:     General: There is no distension.     Palpations: Abdomen is soft.     Tenderness: There is no abdominal tenderness. There is no rebound.  Musculoskeletal:        General: No tenderness. Normal range of motion.     Cervical back: Normal range of motion and neck supple.  Skin:    General: Skin is warm and dry.     Findings: No abrasion or rash.  Neurological:     Mental Status: She is lethargic, disoriented and confused.     GCS: GCS eye subscore is 3. GCS verbal subscore is 4. GCS motor subscore is 5.     Cranial Nerves: Cranial nerves are intact. No cranial nerve deficit.     Sensory: No sensory deficit.     Motor: No tremor.  Psychiatric:        Attention and Perception: She is inattentive.     ED Results / Procedures / Treatments   Labs (all labs ordered are listed, but only abnormal results are displayed) Labs Reviewed  RESP PANEL BY RT-PCR (RSV, FLU A&B, COVID)  [id number]  CULTURE, BLOOD (ROUTINE X 2)  CULTURE, BLOOD (ROUTINE X 2)  COMPREHENSIVE METABOLIC PANEL  CBC WITH DIFFERENTIAL/PLATELET  PROTIME-INR  APTT  URINALYSIS, W/ REFLEX TO CULTURE (INFECTION SUSPECTED)   I-STAT CG4 LACTIC ACID, ED  I-STAT CHEM 8, ED    EKG EKG Interpretation Date/Time:  Sunday April 07 2023 14:23:47 EST Ventricular Rate:  118 PR Interval:  153 QRS Duration:  94 QT Interval:  309 QTC Calculation: 433 R Axis:   53  Text Interpretation: Sinus tachycardia Low voltage, precordial leads Borderline repolarization abnormality Confirmed by Lorre Nick ([id number]) on 04/07/2023 3:07:25 PM  Radiology No results found.  Procedures Procedures    Medications Ordered in ED Medications  lactated ringers infusion (has no administration in time range)    ED Course/ Medical Decision Making/ A&P                                 Medical Decision Making Amount and/or Complexity of Data Reviewed Labs: ordered. Radiology: ordered. ECG/medicine tests: ordered.  Risk Prescription drug management.   Patient is EKG consistent with sinus tachycardia.  Chest x-ray negative for patient with pneumonia.  Concern for possible sepsis.  Lactate is normal.  Urinalysis shows infection.  Started on Rocephin 2 g IV piggyback.  COVID and flu test negative.  No leukocytosis on CBC.  Electrolytes significant for mild acute kidney injury with elevated creatinine as well as BUN.  Given IV fluids here as well.  Patient to require admission.  Will consult hospitalist team        Final Clinical Impression(s) / ED Diagnoses Final diagnoses:  None    Rx / DC Orders ED Discharge Orders     None         Lorre Nick, MD 04/07/23 1539

## 2023-04-08 DIAGNOSIS — Z1152 Encounter for screening for COVID-19: Secondary | ICD-10-CM | POA: Diagnosis not present

## 2023-04-08 DIAGNOSIS — G40909 Epilepsy, unspecified, not intractable, without status epilepticus: Secondary | ICD-10-CM | POA: Diagnosis present

## 2023-04-08 DIAGNOSIS — E870 Hyperosmolality and hypernatremia: Secondary | ICD-10-CM | POA: Diagnosis present

## 2023-04-08 DIAGNOSIS — N179 Acute kidney failure, unspecified: Secondary | ICD-10-CM | POA: Diagnosis present

## 2023-04-08 DIAGNOSIS — E785 Hyperlipidemia, unspecified: Secondary | ICD-10-CM | POA: Diagnosis present

## 2023-04-08 DIAGNOSIS — K861 Other chronic pancreatitis: Secondary | ICD-10-CM | POA: Diagnosis present

## 2023-04-08 DIAGNOSIS — Q780 Osteogenesis imperfecta: Secondary | ICD-10-CM | POA: Diagnosis not present

## 2023-04-08 DIAGNOSIS — G9340 Encephalopathy, unspecified: Secondary | ICD-10-CM | POA: Diagnosis not present

## 2023-04-08 DIAGNOSIS — F1010 Alcohol abuse, uncomplicated: Secondary | ICD-10-CM | POA: Diagnosis present

## 2023-04-08 DIAGNOSIS — M810 Age-related osteoporosis without current pathological fracture: Secondary | ICD-10-CM | POA: Diagnosis present

## 2023-04-08 DIAGNOSIS — I10 Essential (primary) hypertension: Secondary | ICD-10-CM | POA: Diagnosis present

## 2023-04-08 DIAGNOSIS — N390 Urinary tract infection, site not specified: Secondary | ICD-10-CM | POA: Diagnosis present

## 2023-04-08 DIAGNOSIS — G8929 Other chronic pain: Secondary | ICD-10-CM | POA: Diagnosis present

## 2023-04-08 DIAGNOSIS — R569 Unspecified convulsions: Secondary | ICD-10-CM | POA: Diagnosis present

## 2023-04-08 DIAGNOSIS — E860 Dehydration: Secondary | ICD-10-CM | POA: Diagnosis present

## 2023-04-08 DIAGNOSIS — F32A Depression, unspecified: Secondary | ICD-10-CM | POA: Diagnosis present

## 2023-04-08 DIAGNOSIS — R001 Bradycardia, unspecified: Secondary | ICD-10-CM | POA: Diagnosis present

## 2023-04-08 DIAGNOSIS — R296 Repeated falls: Secondary | ICD-10-CM | POA: Diagnosis present

## 2023-04-08 DIAGNOSIS — G928 Other toxic encephalopathy: Secondary | ICD-10-CM | POA: Diagnosis present

## 2023-04-08 DIAGNOSIS — E876 Hypokalemia: Secondary | ICD-10-CM | POA: Diagnosis present

## 2023-04-08 DIAGNOSIS — D638 Anemia in other chronic diseases classified elsewhere: Secondary | ICD-10-CM | POA: Diagnosis present

## 2023-04-08 DIAGNOSIS — I739 Peripheral vascular disease, unspecified: Secondary | ICD-10-CM | POA: Diagnosis present

## 2023-04-08 DIAGNOSIS — F419 Anxiety disorder, unspecified: Secondary | ICD-10-CM | POA: Diagnosis present

## 2023-04-08 LAB — CBC
HCT: 41.8 % (ref 36.0–46.0)
Hemoglobin: 13.3 g/dL (ref 12.0–15.0)
MCH: 31.9 pg (ref 26.0–34.0)
MCHC: 31.8 g/dL (ref 30.0–36.0)
MCV: 100.2 fL — ABNORMAL HIGH (ref 80.0–100.0)
Platelets: 209 10*3/uL (ref 150–400)
RBC: 4.17 MIL/uL (ref 3.87–5.11)
RDW: 13.2 % (ref 11.5–15.5)
WBC: 7.9 10*3/uL (ref 4.0–10.5)
nRBC: 0 % (ref 0.0–0.2)

## 2023-04-08 LAB — POCT I-STAT, CHEM 8
BUN: 45 mg/dL — ABNORMAL HIGH (ref 8–23)
Calcium, Ion: 1.21 mmol/L (ref 1.15–1.40)
Chloride: 112 mmol/L — ABNORMAL HIGH (ref 98–111)
Creatinine, Ser: 1.3 mg/dL — ABNORMAL HIGH (ref 0.44–1.00)
Glucose, Bld: 137 mg/dL — ABNORMAL HIGH (ref 70–99)
HCT: 48 % — ABNORMAL HIGH (ref 36.0–46.0)
Hemoglobin: 16.3 g/dL — ABNORMAL HIGH (ref 12.0–15.0)
Potassium: 3.8 mmol/L (ref 3.5–5.1)
Sodium: 149 mmol/L — ABNORMAL HIGH (ref 135–145)
TCO2: 22 mmol/L (ref 22–32)

## 2023-04-08 LAB — BASIC METABOLIC PANEL
Anion gap: 12 (ref 5–15)
BUN: 34 mg/dL — ABNORMAL HIGH (ref 8–23)
CO2: 23 mmol/L (ref 22–32)
Calcium: 8.8 mg/dL — ABNORMAL LOW (ref 8.9–10.3)
Chloride: 116 mmol/L — ABNORMAL HIGH (ref 98–111)
Creatinine, Ser: 0.78 mg/dL (ref 0.44–1.00)
GFR, Estimated: >60 mL/min (ref >60)
Glucose, Bld: 166 mg/dL — ABNORMAL HIGH (ref 70–99)
Potassium: 3.4 mmol/L — ABNORMAL LOW (ref 3.5–5.1)
Sodium: 151 mmol/L — ABNORMAL HIGH (ref 135–145)

## 2023-04-08 LAB — MAGNESIUM: Magnesium: 2.6 mg/dL — ABNORMAL HIGH (ref 1.7–2.4)

## 2023-04-08 LAB — PHOSPHORUS: Phosphorus: 1.1 mg/dL — ABNORMAL LOW (ref 2.5–4.6)

## 2023-04-08 MED ORDER — ORAL CARE MOUTH RINSE
15.0000 mL | OROMUCOSAL | Status: DC
  Administered 2023-04-08 – 2023-04-09 (×4): 15 mL via OROMUCOSAL

## 2023-04-08 MED ORDER — GABAPENTIN 300 MG PO CAPS
300.0000 mg | ORAL_CAPSULE | Freq: Three times a day (TID) | ORAL | Status: DC
  Administered 2023-04-08 – 2023-04-11 (×8): 300 mg via ORAL
  Filled 2023-04-08 (×10): qty 1

## 2023-04-08 MED ORDER — ORAL CARE MOUTH RINSE: 15.0000 mL | OROMUCOSAL | Status: DC | PRN

## 2023-04-08 MED ORDER — DEXTROSE 5 % IV SOLN: INTRAVENOUS | Status: DC

## 2023-04-08 MED ORDER — DULOXETINE HCL 30 MG PO CPEP
30.0000 mg | ORAL_CAPSULE | Freq: Every day | ORAL | Status: DC
Start: 2023-04-08 — End: 2023-04-11
  Administered 2023-04-09 – 2023-04-11 (×3): 30 mg via ORAL
  Filled 2023-04-08 (×4): qty 1

## 2023-04-08 MED ORDER — ACETAMINOPHEN 500 MG PO TABS
1000.0000 mg | ORAL_TABLET | Freq: Three times a day (TID) | ORAL | Status: DC
Start: 2023-04-08 — End: 2023-04-11
  Administered 2023-04-08 – 2023-04-11 (×7): 1000 mg via ORAL
  Filled 2023-04-08 (×10): qty 2

## 2023-04-08 NOTE — Plan of Care (Signed)
  Problem: Activity: Goal: Risk for activity intolerance will decrease Outcome: Progressing   Problem: Safety: Goal: Ability to remain free from injury will improve Outcome: Progressing   Problem: Education: Goal: Knowledge of General Education information will improve Description: Including pain rating scale, medication(s)/side effects and non-pharmacologic comfort measures Outcome: Not Progressing   Problem: Pain Management: Goal: General experience of comfort will improve Outcome: Not Progressing

## 2023-04-08 NOTE — Progress Notes (Signed)
Patient refused all her scheduled medicines this evening. Tried to convince her but she's yelling and cursing. Provider made aware. Will continue to monitor patient.

## 2023-04-08 NOTE — TOC Progression Note (Signed)
Transition of Care [facility]) - Progression Note    Patient Details  Name: Rose Bridges MRN: [id number] Date of Birth: [birth year]  [facility] of Care [facility]) CM/SW Contact  Larrie Kass, LCSW Phone Number: 04/08/2023, 11:18 AM  Clinical Narrative:     Pt is a LTC resident with [facility] , pt  can return when stable for d/c. TOC to follow for d/c needs.   Expected Discharge Plan: Long Term Nursing Home Barriers to Discharge: Continued Medical Work up  Expected Discharge Plan and Services       Living arrangements for the past 2 months: Skilled Nursing Facility                                       Social Determinants of Health (SDOH) Interventions SDOH Screenings   Food Insecurity: Food Insecurity Present (05/22/2022)  Housing: Low Risk  (05/22/2022)  Transportation Needs: No Transportation Needs (05/22/2022)  Recent Concern: Transportation Needs - Unmet Transportation Needs (05/10/2022)  Utilities: Not At Risk (05/22/2022)  Depression (PHQ2-9): Medium Risk (09/09/2019)  Tobacco Use: Medium Risk (04/07/2023)    Readmission Risk Interventions     No data to display

## 2023-04-08 NOTE — Progress Notes (Signed)
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 Adela Lank in the office last 01/29/2023 for was told to continue PPI as well as be careful with her pancreatitis-previous EGD no gross lesions duodenitis duodenal ulcers noted EUS no evidence of choledocholithiasis micro choledocholithiasis and it was felt that her pancreatitis had an unclear etiology and was scheduled for MRCP 06/2023 She has had several visits to the emergency room this year  Ultimately returned 12/8  from [facility] with change in mentation poor p.o. appetite partially responsive--concern for infection with UTI BUN/creatinine 51/1.4 normal lactic acid normal WBC-many bacteria in urine moderate leukocytes negative nitrites turbid appearance CXR no active disease  Plan  Toxic metabolic encephalopathy secondary to AKI superimposed on multiple sedating meds Likely secondary to multiple psychotropics as well as pain meds She is more coherent today-cautiously reintroduce Keppra 500 twice daily Scheduled Tylenol 3 times daily, stop ibuprofen which was the cause for the AKI, cut back gabapentin to 300 3 times daily Cut back Cymbalta  to 30 daily Hold baclofen and Robaxin at this time  ?  UTI Urine culture pending, blood culture X2 pend  continue ceftriaxone 1 g every 24, see below  Hypernatremia/AKI-BUN/creatinine 51/1.4 now improved Free water deficit about 2.6-correct in about 60 hours at 50 cc/H Force fluids orally  Osteogenesis imperfecta + osteoporosis Outpatient follow-up may need DEXA scan  Previous EtOH with seizures on Keppra Previous pancreatic mass-needs outpatient characterization with MRCP in 2025 Keppra as above 500 twice daily  Underlying bradycardia  Previous left cerebellar infarct Continues on aspirin  DVT prophylaxis: Lovenox  Status is: Observation The patient remains OBS appropriate and will d/c before 2 midnights.      Subjective: " You are hurting me" she states that she wants her pain meds We have discussed extensively at the bedside that we need to start them back slowly No dysuria No fever No chills  Objective + exam Vitals:   04/07/23 1712 04/07/23 2028 04/08/23 0140 04/08/23 0612  BP: (!) 153/92 (!) 149/78 (!) 144/68 131/74  Pulse: (!) 114 77 85 68  Resp: 16 16 15 14   Temp: 98.4 F (36.9 C) 98.4 F (36.9 C) 98 F (36.7 C) 97.7 F (36.5 C)  TempSrc: Oral Oral Oral Oral  SpO2: 97% 100% 100%   Weight: 52.5 kg     Height: 5\' 4"  (1.626 m)      Filed Weights   04/07/23 1421 04/07/23 1712  Weight: 58 kg 52.5 kg    Examination:  EOMI NCAT no focal deficit no icterus no pallor No wheeze rales or rhonchi ROM intact CTAB no added sound S1-S2 no murmur Abdomen soft She seems objectively not to be in pain but subjectively when able move the covers she has significant pain in her lower extremities  which is quite odd  Data Reviewed: reviewed   CBC    Component Value Date/Time   WBC 7.9 04/08/2023 0352   RBC 4.17 04/08/2023 0352   HGB 13.3 04/08/2023 0352   HGB 17.7 (H) 05/28/2019 1451   HCT 41.8 04/08/2023 0352   HCT 54.0 (H) 05/28/2019 1451   PLT 209  04/08/2023 0352   PLT 229 05/28/2019 1451   MCV 100.2 (H) 04/08/2023 0352   MCV 92 05/28/2019 1451   MCH 31.9 04/08/2023 0352   MCHC 31.8 04/08/2023 0352   RDW 13.2 04/08/2023 0352   RDW 12.9 05/28/2019 1451   LYMPHSABS 2.0 04/07/2023 1424   MONOABS 0.4 04/07/2023 1424   EOSABS 0.0 04/07/2023 1424   BASOSABS 0.0 04/07/2023 1424      Latest Ref Rng & Units 04/08/2023    3:52 AM 04/07/2023    2:24 PM 03/29/2023   11:04 AM  CMP  Glucose 70 - 99 mg/dL 621  308  657   BUN 8 - 23 mg/dL 34  51  30   Creatinine 0.44 - 1.00 mg/dL 8.46  9.62  9.52   Sodium 135 - 145 mmol/L 151  144  140   Potassium 3.5 - 5.1 mmol/L 3.4  3.6  4.6   Chloride 98 - 111 mmol/L 116  108  104   CO2 22 - 32 mmol/L 23  20  23    Calcium 8.9 - 10.3 mg/dL 8.8  9.2  9.4   Total Protein 6.5 - 8.1 g/dL  7.4  7.0   Total Bilirubin <1.2 mg/dL  1.0  1.0   Alkaline Phos 38 - 126 U/L  68  75   AST 15 - 41 U/L  15  34   ALT 0 - 44 U/L  12  16      Scheduled Meds:  aspirin  81 mg Oral Daily   enoxaparin (LOVENOX) injection  40 mg Subcutaneous QHS   levETIRAcetam  500 mg Oral BID   mouth rinse  15 mL Mouth Rinse 4 times per day   pantoprazole  40 mg Oral Daily   sodium chloride flush  3 mL Intravenous Q12H   Continuous Infusions:  sodium chloride 125 mL/hr at 04/08/23 0533   cefTRIAXone (ROCEPHIN)  IV      Time  45  Rhetta Mura, MD  [facility]

## 2023-04-08 NOTE — Progress Notes (Signed)
Patient refused morning medications, see MAR. MD notified. Pt is refusing to answer questions, unable to complete the admission history.

## 2023-04-09 DIAGNOSIS — G9340 Encephalopathy, unspecified: Secondary | ICD-10-CM | POA: Diagnosis not present

## 2023-04-09 LAB — BASIC METABOLIC PANEL
Anion gap: 12 (ref 5–15)
BUN: 15 mg/dL (ref 8–23)
CO2: 23 mmol/L (ref 22–32)
Calcium: 8.5 mg/dL — ABNORMAL LOW (ref 8.9–10.3)
Chloride: 109 mmol/L (ref 98–111)
Creatinine, Ser: 0.49 mg/dL (ref 0.44–1.00)
GFR, Estimated: >60 mL/min (ref >60)
Glucose, Bld: 107 mg/dL — ABNORMAL HIGH (ref 70–99)
Potassium: 3 mmol/L — ABNORMAL LOW (ref 3.5–5.1)
Sodium: 144 mmol/L (ref 135–145)

## 2023-04-09 LAB — CBC
HCT: 40.8 % (ref 36.0–46.0)
Hemoglobin: 13.1 g/dL (ref 12.0–15.0)
MCH: 31.3 pg (ref 26.0–34.0)
MCHC: 32.1 g/dL (ref 30.0–36.0)
MCV: 97.6 fL (ref 80.0–100.0)
Platelets: 175 10*3/uL (ref 150–400)
RBC: 4.18 MIL/uL (ref 3.87–5.11)
RDW: 13.2 % (ref 11.5–15.5)
WBC: 8.9 10*3/uL (ref 4.0–10.5)
nRBC: 0 % (ref 0.0–0.2)

## 2023-04-09 LAB — HIV ANTIBODY (ROUTINE TESTING W REFLEX): HIV Screen 4th Generation wRfx: NONREACTIVE

## 2023-04-09 MED ORDER — POTASSIUM CHLORIDE CRYS ER 20 MEQ PO TBCR
40.0000 meq | EXTENDED_RELEASE_TABLET | Freq: Two times a day (BID) | ORAL | Status: DC
  Administered 2023-04-09 – 2023-04-11 (×5): 40 meq via ORAL
  Filled 2023-04-09 (×5): qty 2

## 2023-04-09 NOTE — Progress Notes (Signed)
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 with unwitnessed seizures previously-on Keppra--- has had several falls and fractures since 2018 upper lower extremities and admitted for the same in the setting of EtOH Bradycardia Hospitalization 1/11--- 1/19 behavioral change paranoia agitation pressured speech-CT head age-indeterminate L cerebellar infarct-treated at bedtime for UTI-also acute pancreatitis--was discharged and returned 05/25/2022 and was supposed to have an EUS in the outpatient setting 07/05/2022 and developed recurrent pancreatitis  followed up with Rose Bridges in the office last 01/29/2023 for was told to continue PPI as well as be careful with her pancreatitis-previous EGD no gross lesions duodenitis duodenal ulcers noted EUS no evidence of choledocholithiasis micro choledocholithiasis and it was felt that her pancreatitis had an unclear etiology and was scheduled for MRCP 06/2023 She has had several visits to the emergency room this year  Ultimately returned 12/8  from [facility] with change in mentation poor p.o. appetite partially responsive--concern for infection with UTI BUN/creatinine 51/1.4 normal lactic acid normal WBC-many bacteria in urine moderate leukocytes negative nitrites turbid appearance CXR no active disease  Plan  Toxic metabolic encephalopathy secondary to AKI superimposed on multiple sedating meds Likely secondary to multiple psychotropics as well as pain meds cautiously reintroduce Keppra 500 twice daily Scheduled Tylenol 3 times daily, stop ibuprofen which was the cause for the AKI,  Keep dose gabapentin to 300 3 times daily--keep Cymbalta to 30 daily Hold baclofen  and Robaxin at this time  ?  UTI Urine culture shows >100,000 GNR rods  continue ceftriaxone 1 g every 24, see below  Hypernatremia/AKI-BUN/creatinine 51/1.4 now improved Free water deficit now corrected-force oral fluids and saline lock  Mild hypokalemia give K-Lor 40 twice daily recheck with magnesium in a.m.  Osteogenesis imperfecta + osteoporosis Outpatient follow-up may need DEXA scan  Previous EtOH with seizures on Keppra Previous pancreatic mass-needs outpatient characterization with MRCP in 2025 Keppra as above 500 twice daily  Underlying bradycardia  Previous left cerebellar infarct Continues on aspirin  DVT prophylaxis: Lovenox  Status is: Observation The patient remains OBS appropriate and will d/c before 2 midnights.      Subjective:  Patient quite aggressive agitated and did not want me to examine Working with therapy    Objective + exam Vitals:   04/08/23 1859 04/08/23 2032 04/09/23 0608 04/09/23 1259  BP:  134/65 (!) 104/56 (!) 144/70  Pulse:  (!) 51 (!) 57 64  Resp:  20 19 16   Temp:  97.8 F (36.6 C) 98.3 F (36.8 C) 98.1 F (36.7 C)  TempSrc:  Oral Oral Oral  SpO2: 98% 98% 98% 99%  Weight:      Height:       Filed Weights   04/07/23 1421 04/07/23 1712  Weight: 58 kg 52.5 kg    Examination:  Patient not examined   Data Reviewed: reviewed   CBC    Component Value Date/Time   WBC 8.9 04/09/2023 0347   RBC 4.18 04/09/2023 0347   HGB 13.1 04/09/2023 0347   HGB 17.7 (H) 05/28/2019 1451   HCT 40.8 04/09/2023 0347   HCT 54.0 (H) 05/28/2019 1451   PLT 175 04/09/2023 0347   PLT 229 05/28/2019 1451   MCV  97.6 04/09/2023 0347   MCV 92 05/28/2019 1451   MCH 31.3 04/09/2023 0347   MCHC 32.1 04/09/2023 0347   RDW 13.2 04/09/2023 0347   RDW 12.9 05/28/2019 1451   LYMPHSABS 2.0 04/07/2023 1424   MONOABS 0.4 04/07/2023 1424   EOSABS 0.0 04/07/2023 1424   BASOSABS 0.0 04/07/2023 1424      Latest Ref Rng & Units 04/09/2023    3:47  AM 04/08/2023    3:52 AM 04/07/2023    2:46 PM  CMP  Glucose 70 - 99 mg/dL 161  096  045   BUN 8 - 23 mg/dL 15  34  45   Creatinine 0.44 - 1.00 mg/dL 4.09  8.11  9.14   Sodium 135 - 145 mmol/L 144  151  149   Potassium 3.5 - 5.1 mmol/L 3.0  3.4  3.8   Chloride 98 - 111 mmol/L 109  116  112   CO2 22 - 32 mmol/L 23  23    Calcium 8.9 - 10.3 mg/dL 8.5  8.8       Scheduled Meds:  acetaminophen  1,000 mg Oral TID   aspirin  81 mg Oral Daily   DULoxetine  30 mg Oral Daily   enoxaparin (LOVENOX) injection  40 mg Subcutaneous QHS   gabapentin  300 mg Oral TID   levETIRAcetam  500 mg Oral BID   mouth rinse  15 mL Mouth Rinse 4 times per day   pantoprazole  40 mg Oral Daily   potassium chloride  40 mEq Oral BID   sodium chloride flush  3 mL Intravenous Q12H   Continuous Infusions:  cefTRIAXone (ROCEPHIN)  IV Stopped (04/08/23 1634)    Time  25  Rose Mura, MD  [facility]
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
## 2023-04-09 NOTE — Plan of Care (Signed)
  Problem: Activity: Goal: Risk for activity intolerance will decrease Outcome: Progressing   Problem: Pain Management: Goal: General experience of comfort will improve Outcome: Progressing   Problem: Safety: Goal: Ability to remain free from injury will improve Outcome: Progressing   Problem: Health Behavior/Discharge Planning: Goal: Ability to manage health-related needs will improve Outcome: Not Progressing

## 2023-04-10 DIAGNOSIS — G9340 Encephalopathy, unspecified: Secondary | ICD-10-CM | POA: Diagnosis not present

## 2023-04-10 LAB — BASIC METABOLIC PANEL
Anion gap: 8 (ref 5–15)
BUN: 15 mg/dL (ref 8–23)
CO2: 25 mmol/L (ref 22–32)
Calcium: 8.5 mg/dL — ABNORMAL LOW (ref 8.9–10.3)
Chloride: 111 mmol/L (ref 98–111)
Creatinine, Ser: 0.41 mg/dL — ABNORMAL LOW (ref 0.44–1.00)
GFR, Estimated: >60 mL/min (ref >60)
Glucose, Bld: 123 mg/dL — ABNORMAL HIGH (ref 70–99)
Potassium: 3.7 mmol/L (ref 3.5–5.1)
Sodium: 144 mmol/L (ref 135–145)

## 2023-04-10 LAB — CBC
HCT: 35.3 % — ABNORMAL LOW (ref 36.0–46.0)
Hemoglobin: 11.6 g/dL — ABNORMAL LOW (ref 12.0–15.0)
MCH: 31.4 pg (ref 26.0–34.0)
MCHC: 32.9 g/dL (ref 30.0–36.0)
MCV: 95.7 fL (ref 80.0–100.0)
Platelets: 191 10*3/uL (ref 150–400)
RBC: 3.69 MIL/uL — ABNORMAL LOW (ref 3.87–5.11)
RDW: 13.2 % (ref 11.5–15.5)
WBC: 7.4 10*3/uL (ref 4.0–10.5)
nRBC: 0 % (ref 0.0–0.2)

## 2023-04-10 LAB — MAGNESIUM: Magnesium: 1.6 mg/dL — ABNORMAL LOW (ref 1.7–2.4)

## 2023-04-10 MED ORDER — ORAL CARE MOUTH RINSE: 15.0000 mL | OROMUCOSAL | Status: DC | PRN

## 2023-04-10 MED ORDER — MAGNESIUM SULFATE 2 GM/50ML IV SOLN
2.0000 g | Freq: Once | INTRAVENOUS | Status: AC
  Administered 2023-04-10: 2 g via INTRAVENOUS
  Filled 2023-04-10: qty 50

## 2023-04-10 NOTE — Progress Notes (Signed)
PROGRESS NOTE    Rose Bridges  MWN:[id number] DOB: July 23, 1960 DOA: 04/07/2023 PCP: Lyman Speller   Brief Narrative:  62 y.o. female with medical history significant for hypertension, hyperlipidemia, CVA, alcohol abuse, depression, anxiety, and chronic pain presented from SNF with altered mental status and decreased oral intake.  On presentation, she was afebrile but was tachypneic, tachycardic with creatinine of 1.43, normal lactic acid, normal WBC and negative respiratory virus panel.  UA was suggestive of UTI.  She was started on IV fluids and antibiotics.  Assessment & Plan:   Probable UTI: Present on admission -Urine culture growing gram-negative rods.  Follow identification and susceptibilities.  Blood cultures negative so far -Continue Rocephin  Acute metabolic encephalopathy -Possibly from above, dehydration, AKI and polypharmacy. -Monitor mental status.  Still slow to respond but improving.  Off IV fluids.  Continue antibiotics as above. -Fall precautions.  PT following and recommending SNF placement.  TOC following   AKI -Resolved with IV fluids  Hypokalemia -Improved  Hypomagnesemia -Replace.  Repeat a.m. labs  Hypophosphatemia -No labs today  Hypernatremia -Resolved  Depression/anxiety -Continue duloxetine.  Outpatient follow-up with psychiatry  Chronic pain -Continue gabapentin.  Holding baclofen and Robaxin.  Continue scheduled Tylenol.  Ibuprofen stopped.  Seizure disorder -Continue Keppra  History of unspecified CVA -Continue aspirin  Osteogenesis imperfecta Osteoporosis -Outpatient follow-up  DVT prophylaxis: Lovenox Code Status: Full Family Communication: None at bedside Disposition Plan: Status is: Inpatient Remains inpatient appropriate because: Of severity of illness    Consultants: None  Procedures: None  Antimicrobials: Rocephin from 04/07/2023 onwards   Subjective: Patient seen and examined at bedside.  Extremely poor  historian.  No fever, vomiting or agitation reported.  Objective: Vitals:   04/09/23 0608 04/09/23 1259 04/09/23 2010 04/10/23 0509  BP: (!) 104/56 (!) 144/70 123/74 (!) 105/59  Pulse: (!) 57 64 71 73  Resp: 19 16 15 18   Temp: 98.3 F (36.8 C) 98.1 F (36.7 C) 97.9 F (36.6 C) 97.8 F (36.6 C)  TempSrc: Oral Oral Oral Oral  SpO2: 98% 99% 100% 96%  Weight:      Height:        Intake/Output Summary (Last 24 hours) at 04/10/2023 1159 Last data filed at 04/09/2023 2200 Gross per 24 hour  Intake 452 ml  Output 850 ml  Net -398 ml   Filed Weights   04/07/23 1421 04/07/23 1712  Weight: 58 kg 52.5 kg    Examination:  General exam: Appears calm and comfortable.  On room air.  Chronically ill and deconditioned looking. Respiratory system: Bilateral decreased breath sounds at bases with scattered crackles Cardiovascular system: S1 & S2 heard, Rate controlled Gastrointestinal system: Abdomen is nondistended, soft and nontender. Normal bowel sounds heard. Extremities: No cyanosis, clubbing, edema  Central nervous system: Awake, slow to respond, poor historian.  Skin: No rashes, lesions or ulcers Psychiatry: Flat affect.  Not agitated.   Data Reviewed: I have personally reviewed following labs and imaging studies  CBC: Recent Labs  Lab 04/07/23 1424 04/07/23 1446 04/08/23 0352 04/09/23 0347 04/10/23 0409  WBC 8.1  --  7.9 8.9 7.4  NEUTROABS 5.6  --   --   --   --   HGB 15.2* 16.3* 13.3 13.1 11.6*  HCT 47.0* 48.0* 41.8 40.8 35.3*  MCV 98.1  --  100.2* 97.6 95.7  PLT 255  --  209 175 191   Basic Metabolic Panel: Recent Labs  Lab 04/07/23 1424 04/07/23 1446 04/08/23 0352 04/09/23 0347 04/10/23 0409  NA 144 149* 151* 144 144  K 3.6 3.8 3.4* 3.0* 3.7  CL 108 112* 116* 109 111  CO2 20*  --  23 23 25   GLUCOSE 141* 137* 166* 107* 123*  BUN 51* 45* 34* 15 15  CREATININE 1.43* 1.30* 0.78 0.49 0.41*  CALCIUM 9.2  --  8.8* 8.5* 8.5*  MG  --   --  2.6*  --  1.6*   PHOS  --   --  1.1*  --   --    GFR: Estimated Creatinine Clearance: 59.7 mL/min (A) (by C-G formula based on SCr of 0.41 mg/dL (L)). Liver Function Tests: Recent Labs  Lab 04/07/23 1424  AST 15  ALT 12  ALKPHOS 68  BILITOT 1.0  PROT 7.4  ALBUMIN 3.8   No results for input(s): "LIPASE", "AMYLASE" in the last 168 hours. No results for input(s): "AMMONIA" in the last 168 hours. Coagulation Profile: Recent Labs  Lab 04/07/23 1424  INR 1.0   Cardiac Enzymes: No results for input(s): "CKTOTAL", "CKMB", "CKMBINDEX", "TROPONINI" in the last 168 hours. BNP (last 3 results) No results for input(s): "PROBNP" in the last 8760 hours. HbA1C: No results for input(s): "HGBA1C" in the last 72 hours. CBG: No results for input(s): "GLUCAP" in the last 168 hours. Lipid Profile: No results for input(s): "CHOL", "HDL", "LDLCALC", "TRIG", "CHOLHDL", "LDLDIRECT" in the last 72 hours. Thyroid Function Tests: No results for input(s): "TSH", "T4TOTAL", "FREET4", "T3FREE", "THYROIDAB" in the last 72 hours. Anemia Panel: No results for input(s): "VITAMINB12", "FOLATE", "FERRITIN", "TIBC", "IRON", "RETICCTPCT" in the last 72 hours. Sepsis Labs: Recent Labs  Lab 04/07/23 1452  LATICACIDVEN 1.8    Recent Results (from the past 240 hour(s))  Resp panel by RT-PCR (RSV, Flu A&B, Covid) Anterior Nasal Swab     Status: None   Collection Time: 04/07/23  2:24 PM   Specimen: Anterior Nasal Swab  Result Value Ref Range Status   SARS Coronavirus 2 by RT PCR NEGATIVE NEGATIVE Final    Comment: (NOTE) SARS-CoV-2 target nucleic acids are NOT DETECTED.  The SARS-CoV-2 RNA is generally detectable in upper respiratory specimens during the acute phase of infection. The lowest concentration of SARS-CoV-2 viral copies this assay can detect is 138 copies/mL. A negative result does not preclude SARS-Cov-2 infection and should not be used as the sole basis for treatment or other patient management decisions.  A negative result may occur with  improper specimen collection/handling, submission of specimen other than nasopharyngeal swab, presence of viral mutation(s) within the areas targeted by this assay, and inadequate number of viral copies(<138 copies/mL). A negative result must be combined with clinical observations, patient history, and epidemiological information. The expected result is Negative.  Fact Sheet for Patients:  BloggerCourse.com  Fact Sheet for Healthcare Providers:  SeriousBroker.it  This test is no t yet approved or cleared by the [place] FDA and  has been authorized for detection and/or diagnosis of SARS-CoV-2 by FDA under an Emergency Use Authorization (EUA). This EUA will remain  in effect (meaning this test can be used) for the duration of the COVID-19 declaration under Section 564(b)(1) of the Act, 21 U.S.C.section 360bbb-3(b)(1), unless the authorization is terminated  or revoked sooner.       Influenza A by PCR NEGATIVE NEGATIVE Final   Influenza B by PCR NEGATIVE NEGATIVE Final    Comment: (NOTE) The Xpert Xpress SARS-CoV-2/FLU/RSV plus assay is intended as an aid in the diagnosis of influenza from Nasopharyngeal swab specimens and should  not be used as a sole basis for treatment. Nasal washings and aspirates are unacceptable for Xpert Xpress SARS-CoV-2/FLU/RSV testing.  Fact Sheet for Patients: BloggerCourse.com  Fact Sheet for Healthcare Providers: SeriousBroker.it  This test is not yet approved or cleared by the [place] FDA and has been authorized for detection and/or diagnosis of SARS-CoV-2 by FDA under an Emergency Use Authorization (EUA). This EUA will remain in effect (meaning this test can be used) for the duration of the COVID-19 declaration under Section 564(b)(1) of the Act, 21 U.S.C. section 360bbb-3(b)(1), unless the authorization  is terminated or revoked.     Resp Syncytial Virus by PCR NEGATIVE NEGATIVE Final    Comment: (NOTE) Fact Sheet for Patients: BloggerCourse.com  Fact Sheet for Healthcare Providers: SeriousBroker.it  This test is not yet approved or cleared by the [place] FDA and has been authorized for detection and/or diagnosis of SARS-CoV-2 by FDA under an Emergency Use Authorization (EUA). This EUA will remain in effect (meaning this test can be used) for the duration of the COVID-19 declaration under Section 564(b)(1) of the Act, 21 U.S.C. section 360bbb-3(b)(1), unless the authorization is terminated or revoked.  Performed at [facility], [address], [place], [place] [address]   Blood Culture (routine x 2)     Status: None (Preliminary result)   Collection Time: 04/07/23  2:24 PM   Specimen: BLOOD  Result Value Ref Range Status   Specimen Description   Final    BLOOD BLOOD RIGHT ARM Performed at [facility], [address] [place], [place] [address]    Special Requests   Final    BOTTLES DRAWN AEROBIC AND ANAEROBIC Blood Culture results may not be optimal due to an inadequate volume of blood received in culture bottles Performed at [facility], [address], [place], [place] [address]    Culture   Final    NO GROWTH 3 DAYS Performed at [facility], [address] [place], [place] [address]    Report Status PENDING  Incomplete  Urine Culture     Status: Abnormal (Preliminary result)   Collection Time: 04/07/23  2:45 PM   Specimen: Urine, Random  Result Value Ref Range Status   Specimen Description   Final    URINE, RANDOM Performed at [facility], [address], [place], [place] [address]    Special Requests   Final    NONE Reflexed from [id number] Performed at [facility], [address], [place], [place]  [address]    Culture (A)  Final    >=100,000 COLONIES/mL GRAM NEGATIVE RODS IDENTIFICATION AND SUSCEPTIBILITIES TO FOLLOW Performed at [facility], [address] [place], [place] [address]    Report Status PENDING  Incomplete  Blood Culture (routine x 2)     Status: None (Preliminary result)   Collection Time: 04/07/23  6:29 PM   Specimen: BLOOD  Result Value Ref Range Status   Specimen Description   Final    BLOOD SITE NOT SPECIFIED Performed at [facility], [address] [place], [place] [address]    Special Requests   Final    BOTTLES DRAWN AEROBIC ONLY Blood Culture results may not be optimal due to an inadequate volume of blood received in culture bottles Performed at [facility], [address], [place], [place] [address]    Culture   Final    NO GROWTH 3 DAYS Performed  at [facility], [address], [place], [place] [address]    Report Status PENDING  Incomplete         Radiology Studies: No results found.      Scheduled Meds:  acetaminophen  1,000 mg Oral TID   aspirin  81 mg Oral Daily   DULoxetine  30 mg Oral Daily   enoxaparin (LOVENOX) injection  40 mg Subcutaneous QHS   gabapentin  300 mg Oral TID   levETIRAcetam  500 mg Oral BID   mouth rinse  15 mL Mouth Rinse 4 times per day   pantoprazole  40 mg Oral Daily   potassium chloride  40 mEq Oral BID   sodium chloride flush  3 mL Intravenous Q12H   Continuous Infusions:  cefTRIAXone (ROCEPHIN)  IV 1 g (04/09/23 1544)          Glade Lloyd, MD [facility] 04/10/2023, 11:59 AM

## 2023-04-10 NOTE — Plan of Care (Signed)
  Problem: Education: Goal: Knowledge of General Education information will improve Description: Including pain rating scale, medication(s)/side effects and non-pharmacologic comfort measures Outcome: Progressing   Problem: Activity: Goal: Risk for activity intolerance will decrease Outcome: Progressing   Problem: Coping: Goal: Level of anxiety will decrease Outcome: Progressing   Problem: Elimination: Goal: Will not experience complications related to bowel motility Outcome: Progressing   Problem: Elimination: Goal: Will not experience complications related to urinary retention Outcome: Progressing   Problem: Pain Management: Goal: General experience of comfort will improve Outcome: Progressing   Problem: Safety: Goal: Ability to remain free from injury will improve Outcome: Progressing   Problem: Skin Integrity: Goal: Risk for impaired skin integrity will decrease Outcome: Progressing

## 2023-04-11 DIAGNOSIS — G9340 Encephalopathy, unspecified: Secondary | ICD-10-CM | POA: Diagnosis not present

## 2023-04-11 LAB — CBC WITH DIFFERENTIAL/PLATELET
Abs Immature Granulocytes: 0.01 10*3/uL (ref 0.00–0.07)
Basophils Absolute: 0 10*3/uL (ref 0.0–0.1)
Basophils Relative: 0 %
Eosinophils Absolute: 0.2 10*3/uL (ref 0.0–0.5)
Eosinophils Relative: 3 %
HCT: 35.4 % — ABNORMAL LOW (ref 36.0–46.0)
Hemoglobin: 11.6 g/dL — ABNORMAL LOW (ref 12.0–15.0)
Immature Granulocytes: 0 %
Lymphocytes Relative: 54 %
Lymphs Abs: 3.5 10*3/uL (ref 0.7–4.0)
MCH: 31.5 pg (ref 26.0–34.0)
MCHC: 32.8 g/dL (ref 30.0–36.0)
MCV: 96.2 fL (ref 80.0–100.0)
Monocytes Absolute: 0.3 10*3/uL (ref 0.1–1.0)
Monocytes Relative: 5 %
Neutro Abs: 2.5 10*3/uL (ref 1.7–7.7)
Neutrophils Relative %: 38 %
Platelets: 203 10*3/uL (ref 150–400)
RBC: 3.68 MIL/uL — ABNORMAL LOW (ref 3.87–5.11)
RDW: 13.3 % (ref 11.5–15.5)
WBC: 6.5 10*3/uL (ref 4.0–10.5)
nRBC: 0 % (ref 0.0–0.2)

## 2023-04-11 LAB — BASIC METABOLIC PANEL
Anion gap: 5 (ref 5–15)
BUN: 20 mg/dL (ref 8–23)
CO2: 27 mmol/L (ref 22–32)
Calcium: 8.5 mg/dL — ABNORMAL LOW (ref 8.9–10.3)
Chloride: 110 mmol/L (ref 98–111)
Creatinine, Ser: 0.51 mg/dL (ref 0.44–1.00)
GFR, Estimated: >60 mL/min (ref >60)
Glucose, Bld: 101 mg/dL — ABNORMAL HIGH (ref 70–99)
Potassium: 4.9 mmol/L (ref 3.5–5.1)
Sodium: 142 mmol/L (ref 135–145)

## 2023-04-11 LAB — MAGNESIUM: Magnesium: 1.9 mg/dL (ref 1.7–2.4)

## 2023-04-11 MED ORDER — SODIUM CHLORIDE 0.9 % IV SOLN
1.0000 g | INTRAVENOUS | Status: DC
  Administered 2023-04-11: 1 g via INTRAVENOUS
  Filled 2023-04-11: qty 10

## 2023-04-11 MED ORDER — GABAPENTIN 300 MG PO CAPS: 300.0000 mg | ORAL_CAPSULE | Freq: Three times a day (TID) | ORAL | 0 refills | Status: AC

## 2023-04-11 MED ORDER — DULOXETINE HCL 30 MG PO CPEP: 30.0000 mg | ORAL_CAPSULE | Freq: Every day | ORAL | 0 refills | Status: AC

## 2023-04-11 MED ORDER — MUSCLE RUB 10-15 % EX CREA: 1.0000 | TOPICAL_CREAM | Freq: Two times a day (BID) | CUTANEOUS | Status: DC | PRN

## 2023-04-11 NOTE — Plan of Care (Signed)
  Problem: Activity: Goal: Risk for activity intolerance will decrease Outcome: Progressing   Problem: Coping: Goal: Level of anxiety will decrease Outcome: Progressing   Problem: Pain Management: Goal: General experience of comfort will improve Outcome: Progressing   Problem: Safety: Goal: Ability to remain free from injury will improve Outcome: Progressing   Problem: Skin Integrity: Goal: Risk for impaired skin integrity will decrease Outcome: Progressing

## 2023-04-11 NOTE — Progress Notes (Signed)
Report called to [facility] at [facility].

## 2023-04-11 NOTE — TOC Progression Note (Signed)
Transition of Care [facility]) - Progression Note    Patient Details  Name: Rose Bridges MRN: [id number] Date of Birth: August 28, 1960  [facility] CM/SW Contact  Larrie Kass, LCSW Phone Number: 04/11/2023, 10:41 AM  Clinical Narrative:     Pt to d/c back to LTC facility [facility]. Pt's room 503B, RN to call report [phone]. CSW attempted to call pt's daughter no answer left VM. [name] called TOC sign off.   Expected Discharge Plan: Long Term Nursing Home Barriers to Discharge: No Barriers Identified  Expected Discharge Plan and Services       Living arrangements for the past 2 months: Skilled Nursing Facility Expected Discharge Date: 04/11/23                                     Social Determinants of Health (SDOH) Interventions SDOH Screenings   Food Insecurity: No Food Insecurity (04/08/2023)  Housing: Patient Declined (04/08/2023)  Transportation Needs: No Transportation Needs (04/08/2023)  Utilities: Patient Declined (04/08/2023)  Depression (PHQ2-9): Medium Risk (09/09/2019)  Tobacco Use: Medium Risk (04/07/2023)    Readmission Risk Interventions     No data to display

## 2023-04-11 NOTE — Discharge Summary (Signed)
Physician Discharge Summary  Rose Bridges QIH:[id number] DOB: 14-Aug-1960 DOA: 04/07/2023  PCP: Lyman Speller  Admit date: 04/07/2023 Discharge date: 04/11/2023  Admitted From: SNF Disposition: SNF  Recommendations for Outpatient Follow-up:  Follow up with SNF provider at earliest convenience Follow up in ED if symptoms worsen or new appear   Home Health: No Equipment/Devices: None  Discharge Condition: Stable CODE STATUS: Full Diet recommendation: Regular Brief/Interim Summary: 62 y.o. female with medical history significant for hypertension, hyperlipidemia, CVA, alcohol abuse, depression, anxiety, and chronic pain presented from SNF with altered mental status and decreased oral intake.  On presentation, she was afebrile but was tachypneic, tachycardic with creatinine of 1.43, normal lactic acid, normal WBC and negative respiratory virus panel.  UA was suggestive of UTI.  She was started on IV fluids and antibiotics.  During the hospitalization, her condition has improved.  Urine culture grew gram-negative rods.  Today is day #5 of Rocephin.  She is currently afebrile and hemodynamically stable.  She will not need any more antibiotics.  She will be discharged back to SNF today.  Discharge Diagnoses:   Probable UTI: Present on admission -Urine culture growing gram-negative rods.  Blood cultures negative so far - Today is day #5 of Rocephin.  She is currently afebrile and hemodynamically stable.  She will not need any more antibiotics.  She will be discharged back to SNF today.   Acute metabolic encephalopathy -Possibly from above, dehydration, AKI and polypharmacy. -Monitor mental status.  Still slow to respond but improving.  Off IV fluids.   -Fall precautions.  PT following and recommending SNF placement.   -Will continue to hold Robaxin, Flexeril.  Gabapentin dose has been also decreased.   AKI -Resolved with IV fluids   Hypokalemia -Improved    Hypomagnesemia -Improved   Hypophosphatemia -No labs today   Hypernatremia -Resolved   Depression/anxiety -Continue duloxetine, dose has been decreased.  Outpatient follow-up with SNF provider and/or psychiatry   Chronic pain -Continue gabapentin but at a lower dose.  Holding baclofen and Robaxin.  Continue scheduled Tylenol.  Ibuprofen stopped.   Seizure disorder -Continue Keppra   History of unspecified CVA -Continue aspirin   Osteogenesis imperfecta Osteoporosis -Outpatient follow-up.  Continue outpatient regimen  Anemia of chronic disease -From chronic illnesses with hemoglobin stable.  Outpatient follow-up  Discharge Instructions  Discharge Instructions     Diet general   Complete by: As directed    Increase activity slowly   Complete by: As directed       Allergies as of 04/11/2023       Reactions   Codeine Other (See Comments)   Migraine        Medication List     STOP taking these medications    Baclofen 5 MG Tabs   cephALEXin 500 MG capsule Commonly known as: KEFLEX   cyclobenzaprine 10 MG tablet Commonly known as: FLEXERIL   folic acid 1 MG tablet Commonly known as: FOLVITE   gabapentin 600 MG tablet Commonly known as: NEURONTIN Replaced by: gabapentin 300 MG capsule   hydrOXYzine 10 MG tablet Commonly known as: ATARAX   ibuprofen 600 MG tablet Commonly known as: ADVIL   methocarbamol 500 MG tablet Commonly known as: ROBAXIN   oxyCODONE 5 MG immediate release tablet Commonly known as: Roxicodone   thiamine 100 MG tablet Commonly known as: VITAMIN B1       TAKE these medications    acetaminophen 500 MG tablet Commonly known as: TYLENOL Take 1,000 mg by mouth  3 (three) times daily.   aspirin 81 MG chewable tablet Chew 81 mg by mouth daily.   Biofreeze Cool The Pain 4 % Gel Generic drug: Menthol (Topical Analgesic) Apply 1 application  topically See admin instructions. Apply to both hands and knees 2 times a  day   bisacodyl 10 MG suppository Commonly known as: DULCOLAX Place 10 mg rectally daily as needed (for constipation).   calcitonin (salmon) 200 UNIT/ACT nasal spray Commonly known as: MIACALCIN/FORTICAL Place 1 spray into alternate nostrils daily. What changed:  how to take this when to take this additional instructions   calcium carbonate 500 MG chewable tablet Commonly known as: TUMS - dosed in mg elemental calcium Chew 1 tablet by mouth 2 (two) times daily.   DULoxetine 30 MG capsule Commonly known as: CYMBALTA Take 1 capsule (30 mg total) by mouth daily. Start taking on: April 12, 2023 What changed:  medication strength how much to take   FIBER PO Take 0.4 g by mouth 3 (three) times daily between meals.   gabapentin 300 MG capsule Commonly known as: NEURONTIN Take 1 capsule (300 mg total) by mouth 3 (three) times daily. Replaces: gabapentin 600 MG tablet   levETIRAcetam 500 MG tablet Commonly known as: KEPPRA Take 1 tablet (500 mg total) by mouth 2 (two) times daily.   Lidocaine 3 % Crea Apply 1 application  topically See admin instructions. Apply to both knees 3 times a day   magnesium oxide 400 (240 Mg) MG tablet Commonly known as: MAG-OX Take 400 mg by mouth daily.   Muscle Rub 10-15 % Crea Apply 1 Application topically 2 (two) times daily as needed for muscle pain.   pantoprazole 20 MG tablet Commonly known as: Protonix Take 2 tablets (40 mg total) by mouth daily. What changed: Another medication with the same name was removed. Continue taking this medication, and follow the directions you see here.   polyethylene glycol 17 g packet Commonly known as: MIRALAX / GLYCOLAX Take 17 g by mouth at bedtime as needed for mild constipation (mix into 6 ounces of fluid).   senna 8.6 MG Tabs tablet Commonly known as: SENOKOT Take 1 tablet by mouth daily as needed for mild constipation.   Vitamin D (Ergocalciferol) 1.25 MG (50000 UNIT) Caps  capsule Commonly known as: DRISDOL Take 1 capsule (50,000 Units total) by mouth every 7 (seven) days. What changed: when to take this        Allergies  Allergen Reactions   Codeine Other (See Comments)    Migraine     Consultations: None   Procedures/Studies: DG Chest Port 1 View Result Date: 04/07/2023 CLINICAL DATA:  Evaluate for sepsis. EXAM: PORTABLE CHEST 1 VIEW COMPARISON:  03/29/2023 FINDINGS: The heart size and mediastinal contours are within normal limits. Both lungs are clear. Postoperative changes involving the right humerus. Dystrophic calcifications noted within both breasts. IMPRESSION: No active disease. Electronically Signed   By: Signa Kell M.D.   On: 04/07/2023 15:06   DG Knee Complete 4 Views Left Result Date: 04/03/2023 CLINICAL DATA:  Left knee pain.  No known injury. EXAM: LEFT KNEE - COMPLETE 4+ VIEW COMPARISON:  Left femur radiographs 11/05/2016 FINDINGS: There is diffuse decreased bone mineralization. No joint effusion. Mild medial compartment joint space narrowing. No acute fracture or dislocation. Small bony excrescence extending medially and distally from the medial aspect of the proximal femoral metadiaphysis, away from the knee joint. This is compatible with a benign osteochondroma. IMPRESSION: 1. Mild medial compartment osteoarthritis.  2. Incidental note of probable small osteochondroma of the proximal femoral metadiaphysis. Electronically Signed   By: Neita Garnet M.D.   On: 04/03/2023 16:24   CT Head Wo Contrast Result Date: 03/29/2023 CLINICAL DATA:  Facial paralysis/weakness (CN 7) normally when she gets a UTI she behaves like this. Pt has urinary incontinence. Pt is AOx4. Hallucinations. EXAM: CT HEAD WITHOUT CONTRAST TECHNIQUE: Contiguous axial images were obtained from the base of the skull through the vertex without intravenous contrast. RADIATION DOSE REDUCTION: This exam was performed according to the departmental dose-optimization program  which includes automated exposure control, adjustment of the mA and/or kV according to patient size and/or use of iterative reconstruction technique. COMPARISON:  CT head 10/09/2022 FINDINGS: Brain: Chronic bilateral basal ganglia lacunar infarctions. Patchy and confluent areas of decreased attenuation are noted throughout the deep and periventricular white matter of the cerebral hemispheres bilaterally, compatible with chronic microvascular ischemic disease. No evidence of large-territorial acute infarction. No parenchymal hemorrhage. No mass lesion. No extra-axial collection. No mass effect or midline shift. No hydrocephalus. Basilar cisterns are patent. Vascular: No hyperdense vessel. Atherosclerotic calcifications are present within the cavernous internal carotid arteries. Skull: No acute fracture or focal lesion. Sinuses/Orbits: Paranasal sinuses and mastoid air cells are clear. The orbits are unremarkable. Other: None. IMPRESSION: No acute intracranial abnormality. Electronically Signed   By: Tish Frederickson M.D.   On: 03/29/2023 12:15   DG Chest Port 1 View Result Date: 03/29/2023 CLINICAL DATA:  Weakness.  Hallucinations. EXAM: PORTABLE CHEST 1 VIEW COMPARISON:  05/10/2022 x-ray FINDINGS: Slight left basilar atelectasis. No pneumothorax, effusion or edema. Normal cardiopericardial silhouette. No consolidation fixation hardware about the proximal right humerus. Dystrophic calcifications noted along each breast. IMPRESSION: Minimal left basilar atelectasis.  No consolidation or effusion Electronically Signed   By: Karen Kays M.D.   On: 03/29/2023 12:05      Subjective: Patient seen and examined at bedside.  Awake, poor historian.  No agitation, fever or seizures reported.  Discharge Exam: Vitals:   04/10/23 1946 04/11/23 0615  BP: 114/73 102/63  Pulse: 82 80  Resp: 16 18  Temp: 98.4 F (36.9 C) 98 F (36.7 C)  SpO2: 96% 98%    General: Pt is awake, slow to respond, poor historian.   Looks chronically ill and deconditioned.  On room air.   Cardiovascular: rate controlled, S1/S2 + Respiratory: bilateral decreased breath sounds at bases with some scattered crackles Abdominal: Soft, NT, ND, bowel sounds + Extremities: Trace lower extremity edema; no cyanosis    The results of significant diagnostics from this hospitalization (including imaging, microbiology, ancillary and laboratory) are listed below for reference.     Microbiology: Recent Results (from the past 240 hours)  Resp panel by RT-PCR (RSV, Flu A&B, Covid) Anterior Nasal Swab     Status: None   Collection Time: 04/07/23  2:24 PM   Specimen: Anterior Nasal Swab  Result Value Ref Range Status   SARS Coronavirus 2 by RT PCR NEGATIVE NEGATIVE Final    Comment: (NOTE) SARS-CoV-2 target nucleic acids are NOT DETECTED.  The SARS-CoV-2 RNA is generally detectable in upper respiratory specimens during the acute phase of infection. The lowest concentration of SARS-CoV-2 viral copies this assay can detect is 138 copies/mL. A negative result does not preclude SARS-Cov-2 infection and should not be used as the sole basis for treatment or other patient management decisions. A negative result may occur with  improper specimen collection/handling, submission of specimen other than nasopharyngeal swab, presence of  viral mutation(s) within the areas targeted by this assay, and inadequate number of viral copies(<138 copies/mL). A negative result must be combined with clinical observations, patient history, and epidemiological information. The expected result is Negative.  Fact Sheet for Patients:  BloggerCourse.com  Fact Sheet for Healthcare Providers:  SeriousBroker.it  This test is no t yet approved or cleared by the [place] FDA and  has been authorized for detection and/or diagnosis of SARS-CoV-2 by FDA under an Emergency Use Authorization (EUA). This EUA  will remain  in effect (meaning this test can be used) for the duration of the COVID-19 declaration under Section 564(b)(1) of the Act, 21 U.S.C.section 360bbb-3(b)(1), unless the authorization is terminated  or revoked sooner.       Influenza A by PCR NEGATIVE NEGATIVE Final   Influenza B by PCR NEGATIVE NEGATIVE Final    Comment: (NOTE) The Xpert Xpress SARS-CoV-2/FLU/RSV plus assay is intended as an aid in the diagnosis of influenza from Nasopharyngeal swab specimens and should not be used as a sole basis for treatment. Nasal washings and aspirates are unacceptable for Xpert Xpress SARS-CoV-2/FLU/RSV testing.  Fact Sheet for Patients: BloggerCourse.com  Fact Sheet for Healthcare Providers: SeriousBroker.it  This test is not yet approved or cleared by the [place] FDA and has been authorized for detection and/or diagnosis of SARS-CoV-2 by FDA under an Emergency Use Authorization (EUA). This EUA will remain in effect (meaning this test can be used) for the duration of the COVID-19 declaration under Section 564(b)(1) of the Act, 21 U.S.C. section 360bbb-3(b)(1), unless the authorization is terminated or revoked.     Resp Syncytial Virus by PCR NEGATIVE NEGATIVE Final    Comment: (NOTE) Fact Sheet for Patients: BloggerCourse.com  Fact Sheet for Healthcare Providers: SeriousBroker.it  This test is not yet approved or cleared by the [place] FDA and has been authorized for detection and/or diagnosis of SARS-CoV-2 by FDA under an Emergency Use Authorization (EUA). This EUA will remain in effect (meaning this test can be used) for the duration of the COVID-19 declaration under Section 564(b)(1) of the Act, 21 U.S.C. section 360bbb-3(b)(1), unless the authorization is terminated or revoked.  Performed at [facility], [address], [place], [place] [address]   Blood Culture (routine x 2)     Status: None (Preliminary result)   Collection Time: 04/07/23  2:24 PM   Specimen: BLOOD  Result Value Ref Range Status   Specimen Description   Final    BLOOD BLOOD RIGHT ARM Performed at [facility], [address] [place] [address] [place], [place] [address]    Special Requests   Final    BOTTLES DRAWN AEROBIC AND ANAEROBIC Blood Culture results may not be optimal due to an inadequate volume of blood received in culture bottles Performed at [facility], [address] [place], [place] [address]    Culture   Final    NO GROWTH 4 DAYS Performed at [facility], [address], [place], [place] [address]    Report Status PENDING  Incomplete  Urine Culture     Status: Abnormal (Preliminary result)   Collection Time: 04/07/23  2:45 PM   Specimen: Urine, Random  Result Value Ref Range Status   Specimen Description   Final    URINE, RANDOM Performed at [facility], [address], [place], [place] [address]    Special Requests   Final    NONE Reflexed from [id number] Performed at [facility], [address], [place], [place] [address]    Culture >=100,000 COLONIES/mL GRAM NEGATIVE RODS (A)  Final   Report Status PENDING  Incomplete  Blood Culture (routine x 2)     Status: None (Preliminary result)   Collection Time: 04/07/23  6:29 PM   Specimen: BLOOD  Result Value Ref Range Status   Specimen Description   Final    BLOOD SITE NOT SPECIFIED Performed at [facility], [address] [place], [place] [address]    Special Requests   Final    BOTTLES DRAWN AEROBIC ONLY Blood Culture results may not be optimal due to an inadequate volume of blood received in culture bottles Performed at [facility], [address], [place], [place] [address]    Culture   Final    NO GROWTH 4 DAYS Performed at [facility], [address], [place], [place] [address]    Report Status PENDING  Incomplete     Labs: BNP (last 3 results) No results for input(s): "BNP" in the last 8760 hours. Basic Metabolic Panel: Recent Labs  Lab 04/07/23 1424 04/07/23 1446 04/08/23 0352 04/09/23 0347 04/10/23 0409 04/11/23 0422  NA 144 149* 151* 144 144 142  K 3.6 3.8 3.4* 3.0* 3.7 4.9  CL 108 112* 116* 109 111 110  CO2 20*  --  23 23 25 27   GLUCOSE 141* 137* 166* 107* 123* 101*  BUN 51* 45* 34* 15 15 20   CREATININE 1.43* 1.30* 0.78 0.49 0.41* 0.51  CALCIUM 9.2  --  8.8* 8.5* 8.5* 8.5*  MG  --   --  2.6*  --  1.6* 1.9  PHOS  --   --  1.1*  --   --   --    Liver Function Tests: Recent Labs  Lab 04/07/23 1424  AST 15  ALT 12  ALKPHOS 68  BILITOT 1.0  PROT 7.4  ALBUMIN 3.8   No results for input(s): "LIPASE", "AMYLASE" in the last 168 hours. No results for input(s): "AMMONIA" in the last 168 hours. CBC: Recent Labs  Lab 04/07/23 1424 04/07/23 1446 04/08/23 0352 04/09/23 0347 04/10/23 0409 04/11/23 0422  WBC 8.1  --  7.9 8.9 7.4 6.5  NEUTROABS 5.6  --   --   --   --  2.5  HGB 15.2* 16.3* 13.3 13.1 11.6* 11.6*  HCT 47.0* 48.0* 41.8 40.8 35.3* 35.4*  MCV 98.1  --  100.2* 97.6 95.7 96.2  PLT 255  --  209 175 191 203   Cardiac Enzymes: No results for input(s): "CKTOTAL", "CKMB", "CKMBINDEX", "TROPONINI" in the last 168 hours. BNP: Invalid input(s): "POCBNP" CBG: No results for input(s): "GLUCAP" in the last 168 hours. D-Dimer No results for input(s): "DDIMER" in the last 72 hours. Hgb A1c No results for input(s): "HGBA1C" in the last 72 hours. Lipid Profile No results for input(s): "CHOL", "HDL", "LDLCALC", "TRIG", "CHOLHDL", "LDLDIRECT" in the last 72 hours. Thyroid function studies No results for input(s): "TSH", "T4TOTAL", "T3FREE", "THYROIDAB" in the last 72 hours.  Invalid input(s): "FREET3" Anemia work up No results for input(s): "VITAMINB12", "FOLATE", "FERRITIN", "TIBC", "IRON", "RETICCTPCT" in the  last 72 hours. Urinalysis    Component Value Date/Time   COLORURINE YELLOW 04/07/2023 1445   APPEARANCEUR TURBID (A) 04/07/2023 1445   LABSPEC 1.017 04/07/2023 1445   PHURINE 5.0 04/07/2023 1445   GLUCOSEU NEGATIVE 04/07/2023 1445   HGBUR MODERATE (A) 04/07/2023 1445   BILIRUBINUR NEGATIVE 04/07/2023  1445   KETONESUR 20 (A) 04/07/2023 1445   PROTEINUR 30 (A) 04/07/2023 1445   NITRITE NEGATIVE 04/07/2023 1445   LEUKOCYTESUR MODERATE (A) 04/07/2023 1445   Sepsis Labs Recent Labs  Lab 04/08/23 0352 04/09/23 0347 04/10/23 0409 04/11/23 0422  WBC 7.9 8.9 7.4 6.5   Microbiology Recent Results (from the past 240 hours)  Resp panel by RT-PCR (RSV, Flu A&B, Covid) Anterior Nasal Swab     Status: None   Collection Time: 04/07/23  2:24 PM   Specimen: Anterior Nasal Swab  Result Value Ref Range Status   SARS Coronavirus 2 by RT PCR NEGATIVE NEGATIVE Final    Comment: (NOTE) SARS-CoV-2 target nucleic acids are NOT DETECTED.  The SARS-CoV-2 RNA is generally detectable in upper respiratory specimens during the acute phase of infection. The lowest concentration of SARS-CoV-2 viral copies this assay can detect is 138 copies/mL. A negative result does not preclude SARS-Cov-2 infection and should not be used as the sole basis for treatment or other patient management decisions. A negative result may occur with  improper specimen collection/handling, submission of specimen other than nasopharyngeal swab, presence of viral mutation(s) within the areas targeted by this assay, and inadequate number of viral copies(<138 copies/mL). A negative result must be combined with clinical observations, patient history, and epidemiological information. The expected result is Negative.  Fact Sheet for Patients:  BloggerCourse.com  Fact Sheet for Healthcare Providers:  SeriousBroker.it  This test is no t yet approved or cleared by the [place]  FDA and  has been authorized for detection and/or diagnosis of SARS-CoV-2 by FDA under an Emergency Use Authorization (EUA). This EUA will remain  in effect (meaning this test can be used) for the duration of the COVID-19 declaration under Section 564(b)(1) of the Act, 21 U.S.C.section 360bbb-3(b)(1), unless the authorization is terminated  or revoked sooner.       Influenza A by PCR NEGATIVE NEGATIVE Final   Influenza B by PCR NEGATIVE NEGATIVE Final    Comment: (NOTE) The Xpert Xpress SARS-CoV-2/FLU/RSV plus assay is intended as an aid in the diagnosis of influenza from Nasopharyngeal swab specimens and should not be used as a sole basis for treatment. Nasal washings and aspirates are unacceptable for Xpert Xpress SARS-CoV-2/FLU/RSV testing.  Fact Sheet for Patients: BloggerCourse.com  Fact Sheet for Healthcare Providers: SeriousBroker.it  This test is not yet approved or cleared by the [place] FDA and has been authorized for detection and/or diagnosis of SARS-CoV-2 by FDA under an Emergency Use Authorization (EUA). This EUA will remain in effect (meaning this test can be used) for the duration of the COVID-19 declaration under Section 564(b)(1) of the Act, 21 U.S.C. section 360bbb-3(b)(1), unless the authorization is terminated or revoked.     Resp Syncytial Virus by PCR NEGATIVE NEGATIVE Final    Comment: (NOTE) Fact Sheet for Patients: BloggerCourse.com  Fact Sheet for Healthcare Providers: SeriousBroker.it  This test is not yet approved or cleared by the [place] FDA and has been authorized for detection and/or diagnosis of SARS-CoV-2 by FDA under an Emergency Use Authorization (EUA). This EUA will remain in effect (meaning this test can be used) for the duration of the COVID-19 declaration under Section 564(b)(1) of the Act, 21 U.S.C. section  360bbb-3(b)(1), unless the authorization is terminated or revoked.  Performed at [facility], [address], [place], [place] [address]   Blood Culture (routine x 2)     Status: None (Preliminary result)   Collection Time: 04/07/23  2:24 PM   Specimen: BLOOD  Result Value Ref Range Status   Specimen Description   Final    BLOOD BLOOD RIGHT ARM Performed at [facility], [address], [place], [place] [address]    Special Requests   Final    BOTTLES DRAWN AEROBIC AND ANAEROBIC Blood Culture results may not be optimal due to an inadequate volume of blood received in culture bottles Performed at [facility], [address], [place], [place] [address]    Culture   Final    NO GROWTH 4 DAYS Performed at [facility], [address], [place], [place] [address]    Report Status PENDING  Incomplete  Urine Culture     Status: Abnormal (Preliminary result)   Collection Time: 04/07/23  2:45 PM   Specimen: Urine, Random  Result Value Ref Range Status   Specimen Description   Final    URINE, RANDOM Performed at [facility], [address] [place], [place] [address]    Special Requests   Final    NONE Reflexed from [id number] Performed at [facility], [address], [place], [place] [address]    Culture >=100,000 COLONIES/mL GRAM NEGATIVE RODS (A)  Final   Report Status PENDING  Incomplete  Blood Culture (routine x 2)     Status: None (Preliminary result)   Collection Time: 04/07/23  6:29 PM   Specimen: BLOOD  Result Value Ref Range Status   Specimen Description   Final    BLOOD SITE NOT SPECIFIED Performed at [facility], [address], [place], [place] [address]    Special Requests   Final    BOTTLES DRAWN AEROBIC ONLY Blood Culture results may not be optimal due to an inadequate volume of blood received in culture bottles Performed at [facility],  [address], [place], [place] [address]    Culture   Final    NO GROWTH 4 DAYS Performed at [facility], [address], [place], [place] [address]    Report Status PENDING  Incomplete     Time coordinating discharge: 35 minutes  SIGNED:   Glade Lloyd, MD  [facility] 04/11/2023, 10:14 AM

## 2023-04-11 NOTE — Plan of Care (Signed)

## 2023-04-12 LAB — CULTURE, BLOOD (ROUTINE X 2)
Culture: NO GROWTH
Culture: NO GROWTH

## 2023-04-17 LAB — URINE CULTURE: Culture: >=100000 — AB

## 2023-05-11 ENCOUNTER — Other Ambulatory Visit: Payer: Self-pay

## 2023-05-11 ENCOUNTER — Emergency Department (HOSPITAL_COMMUNITY)
Admission: EM | Admit: 2023-05-11 | Discharge: 2023-05-11 | Disposition: A | Discharge status: Home / Self Care | Payer: Medicare Other | Attending: Emergency Medicine | Admitting: Emergency Medicine

## 2023-05-11 ENCOUNTER — Emergency Department (HOSPITAL_COMMUNITY): Payer: Medicare Other

## 2023-05-11 DIAGNOSIS — M25551 Pain in right hip: Secondary | ICD-10-CM | POA: Diagnosis not present

## 2023-05-11 DIAGNOSIS — R309 Painful micturition, unspecified: Secondary | ICD-10-CM | POA: Diagnosis present

## 2023-05-11 DIAGNOSIS — Z7982 Long term (current) use of aspirin: Secondary | ICD-10-CM | POA: Insufficient documentation

## 2023-05-11 DIAGNOSIS — G8929 Other chronic pain: Secondary | ICD-10-CM | POA: Insufficient documentation

## 2023-05-11 DIAGNOSIS — N390 Urinary tract infection, site not specified: Secondary | ICD-10-CM | POA: Insufficient documentation

## 2023-05-11 MED ORDER — CEPHALEXIN 250 MG PO CAPS
500.0000 mg | ORAL_CAPSULE | Freq: Once | ORAL | Status: AC
Start: 2023-05-11 — End: 2023-05-11
  Administered 2023-05-11: 500 mg via ORAL
  Filled 2023-05-11: qty 2

## 2023-05-11 MED ORDER — OXYCODONE HCL 5 MG PO TABS
5.0000 mg | ORAL_TABLET | Freq: Once | ORAL | Status: AC
  Administered 2023-05-11: 5 mg via ORAL
  Filled 2023-05-11: qty 1

## 2023-05-11 MED ORDER — CEPHALEXIN 500 MG PO CAPS: 500.0000 mg | ORAL_CAPSULE | Freq: Three times a day (TID) | ORAL | 0 refills | Status: DC

## 2023-05-11 NOTE — ED Notes (Signed)
 RN called [name] and pt is 3rd in line

## 2023-05-11 NOTE — ED Notes (Signed)
[name] called no ETA

## 2023-05-11 NOTE — Progress Notes (Signed)
 Attempted call of [name] unsuccessful.

## 2023-05-11 NOTE — ED Triage Notes (Signed)
 According to [facility]: [name], with R hip pain asking staff for pain meds. Staff refused according to pt, pt then  called 911.  BP 120/70 Hr 108 Spo2 96 Temp 99.5 F

## 2023-05-11 NOTE — ED Provider Notes (Signed)
 [place] EMERGENCY DEPARTMENT AT [facility] Provider Note   CSN: [id number] Arrival date & time: 05/11/23  0957     History  Chief Complaint  Patient presents with   Hip Pain    Right hip 10/10 sharp pain.    Rose Bridges is a 63 y.o. female.  HPI    63 year old female comes in with chief complaint of hip pain and also burning with urination.  Patient resides at a nursing home.  She states that she has chronic pain because of osteoarthritis and is on pain medicine.  However over the last 1 months she is also been having pain to her hip.  The doctors at the facility have not assessed her for the pain.  Today the pain was severe, she was not receiving pain medicine and so she called 911.  She suspects that sleeping in a different bed has attributed to her symptoms.  She denies any trauma.  Additionally review of system is also positive for burning with urination.  Patient has previous history of UTI.  She denies any flank pain, fevers, chills. Home Medications Prior to Admission medications   Medication Sig Start Date End Date Taking? Authorizing Provider  cephALEXin  (KEFLEX ) 500 MG capsule Take 1 capsule (500 mg total) by mouth 3 (three) times daily. 05/11/23  Yes Charlyn Sora, MD  acetaminophen  (TYLENOL ) 500 MG tablet Take 1,000 mg by mouth 3 (three) times daily.    [provider]  aspirin  81 MG chewable tablet Chew 81 mg by mouth daily.    [provider]  BIOFREEZE COOL THE PAIN 4 % GEL Apply 1 application  topically See admin instructions. Apply to both hands and knees 2 times a day    [provider]  bisacodyl  (DULCOLAX) 10 MG suppository Place 10 mg rectally daily as needed (for constipation).    [provider]  calcitonin, salmon, (MIACALCIN /FORTICAL) 200 UNIT/ACT nasal spray Place 1 spray into alternate nostrils daily. Patient taking differently: 1 spray See admin instructions. Instill 1 spray into the right nostril once  a day- per [facility] 04/08/22   Quillen, Michael, MD  calcium  carbonate (TUMS - DOSED IN MG ELEMENTAL CALCIUM ) 500 MG chewable tablet Chew 1 tablet by mouth 2 (two) times daily.    [provider]  DULoxetine  (CYMBALTA ) 30 MG capsule Take 1 capsule (30 mg total) by mouth daily. 04/12/23   Cheryle Page, MD  FIBER PO Take 0.4 g by mouth 3 (three) times daily between meals.    [provider]  gabapentin  (NEURONTIN ) 300 MG capsule Take 1 capsule (300 mg total) by mouth 3 (three) times daily. 04/11/23   Cheryle Page, MD  levETIRAcetam  (KEPPRA ) 500 MG tablet Take 1 tablet (500 mg total) by mouth 2 (two) times daily. 05/18/22   Trixie Nilda HERO, MD  Lidocaine  3 % CREA Apply 1 application  topically See admin instructions. Apply to both knees 3 times a day    [provider]  magnesium  oxide (MAG-OX) 400 (240 Mg) MG tablet Take 400 mg by mouth daily.    [provider]  Menthol -Methyl Salicylate (MUSCLE RUB) 10-15 % CREA Apply 1 Application topically 2 (two) times daily as needed for muscle pain. 04/11/23   Cheryle Page, MD  pantoprazole  (PROTONIX ) 20 MG tablet Take 2 tablets (40 mg total) by mouth daily. Patient not taking: Reported on 04/07/2023 01/29/23   Leigh Elspeth SQUIBB, MD  polyethylene glycol (MIRALAX  / GLYCOLAX ) 17 g packet Take 17 g by  mouth at bedtime as needed for mild constipation (mix into 6 ounces of fluid).    [provider]  senna (SENOKOT) 8.6 MG TABS tablet Take 1 tablet by mouth daily as needed for mild constipation.    [provider]  Vitamin D , Ergocalciferol , (DRISDOL ) 1.25 MG (50000 UNIT) CAPS capsule Take 1 capsule (50,000 Units total) by mouth every 7 (seven) days. Patient taking differently: Take 50,000 Units by mouth every Thursday. 04/12/22   Alba Sharper, MD      Allergies    Codeine    Review of Systems   Review of Systems  All other systems reviewed and are negative.   Physical Exam Updated Vital Signs BP  (!) 107/51   Pulse 66   Temp 97.6 F (36.4 C) (Oral)   Resp 17   Ht 5' 4 (1.626 m)   Wt 54.4 kg   SpO2 97%   BMI 20.60 kg/m  Physical Exam Vitals and nursing note reviewed.  Constitutional:      Appearance: She is well-developed.  HENT:     Head: Atraumatic.  Cardiovascular:     Rate and Rhythm: Normal rate.  Pulmonary:     Effort: Pulmonary effort is normal.  Musculoskeletal:     Cervical back: Normal range of motion and neck supple.     Comments: Right hip pain on exam  Skin:    General: Skin is warm and dry.  Neurological:     Mental Status: She is alert and oriented to person, place, and time.     ED Results / Procedures / Treatments   Labs (all labs ordered are listed, but only abnormal results are displayed) Labs Reviewed - No data to display  EKG None  Radiology DG Hip Unilat W or Wo Pelvis 2-3 Views Right Result Date: 05/11/2023 CLINICAL DATA:  63 year old female with right hip pain. Denies known injury. EXAM: DG HIP (WITH OR WITHOUT PELVIS) 2-3V RIGHT COMPARISON:  Right hip series 02/15/2023. FINDINGS: Pelvic osteopenia and proximal left femur arthroplasty again noted. Pelvis appears stable and intact. Right femoral head appears normally located. Proximal right femur appears stable and intact. No acute osseous abnormality identified. Nonobstructed visible bowel gas pattern. Chronic pelvic phleboliths. Calcified femoral artery atherosclerosis. IMPRESSION: Chronic osteopenia and left femur arthroplasty. No acute osseous abnormality identified about the right hip. Electronically Signed   By: VEAR Hurst M.D.   On: 05/11/2023 11:32    Procedures Procedures    Medications Ordered in ED Medications  cephALEXin  (KEFLEX ) capsule 500 mg (has no administration in time range)  oxyCODONE  (Oxy IR/ROXICODONE ) immediate release tablet 5 mg (5 mg Oral Given 05/11/23 1134)    ED Course/ Medical Decision Making/ A&P                                 Medical Decision  Making Amount and/or Complexity of Data Reviewed Radiology: ordered.  Risk Prescription drug management.   62 year old female comes in with chief complaint of right hip pain, burning with urination.  She has previous history of alcohol use disorder, osteogenesis imperfecta, chronic pain syndrome, arthritis and previous history of UTI.  Patient is reporting burning with urination without any other systemic symptoms or flank pain.  Suspect clinically cystitis.  Patient is in a hallway bed.  She cannot walk.  Nursing staff cannot get a clean urine sample easily.  Will clinically treat her as a UTI.  Patient is also  complaining of hip pain.  X-ray of the hip ordered and independently interpreted by me.  No evidence of fracture.  Stable for discharge at this point.  Final Clinical Impression(s) / ED Diagnoses Final diagnoses:  Lower urinary tract infectious disease  Chronic pain of right hip    Rx / DC Orders ED Discharge Orders          Ordered    cephALEXin  (KEFLEX ) 500 MG capsule  3 times daily        05/11/23 1501              Charlyn Sora, MD 05/11/23 1518

## 2023-05-11 NOTE — Discharge Instructions (Signed)
 You were seen in the emergency room for burning with urination and hip pain.  The x-ray is reassuring.  No evidence of any fracture. We are sending you home with prescription antibiotics for suspected UTI.

## 2023-05-17 ENCOUNTER — Emergency Department (HOSPITAL_COMMUNITY)
Admission: EM | Admit: 2023-05-17 | Discharge: 2023-05-18 | Disposition: A | Discharge status: Home / Self Care | Payer: Medicare Other | Attending: Emergency Medicine | Admitting: Emergency Medicine

## 2023-05-17 ENCOUNTER — Emergency Department (HOSPITAL_COMMUNITY): Payer: Medicare Other

## 2023-05-17 ENCOUNTER — Encounter (HOSPITAL_COMMUNITY): Payer: Self-pay | Admitting: Emergency Medicine

## 2023-05-17 ENCOUNTER — Other Ambulatory Visit: Payer: Self-pay

## 2023-05-17 DIAGNOSIS — R4182 Altered mental status, unspecified: Secondary | ICD-10-CM | POA: Diagnosis present

## 2023-05-17 DIAGNOSIS — Z7982 Long term (current) use of aspirin: Secondary | ICD-10-CM | POA: Insufficient documentation

## 2023-05-17 DIAGNOSIS — R410 Disorientation, unspecified: Secondary | ICD-10-CM

## 2023-05-17 LAB — AMMONIA: Ammonia: 32 umol/L (ref 9–35)

## 2023-05-17 LAB — ETHANOL: Alcohol, Ethyl (B): <10 mg/dL (ref <10)

## 2023-05-17 LAB — URINALYSIS, ROUTINE W REFLEX MICROSCOPIC
Bilirubin Urine: NEGATIVE
Glucose, UA: NEGATIVE mg/dL
Hgb urine dipstick: NEGATIVE
Ketones, ur: 20 mg/dL — AB
Leukocytes,Ua: NEGATIVE
Nitrite: NEGATIVE
Protein, ur: NEGATIVE mg/dL
Specific Gravity, Urine: 1.025 (ref 1.005–1.030)
pH: 5 (ref 5.0–8.0)

## 2023-05-17 LAB — CBC
HCT: 41.8 % (ref 36.0–46.0)
Hemoglobin: 13.9 g/dL (ref 12.0–15.0)
MCH: 32.7 pg (ref 26.0–34.0)
MCHC: 33.3 g/dL (ref 30.0–36.0)
MCV: 98.4 fL (ref 80.0–100.0)
Platelets: 245 10*3/uL (ref 150–400)
RBC: 4.25 MIL/uL (ref 3.87–5.11)
RDW: 14.9 % (ref 11.5–15.5)
WBC: 7.4 10*3/uL (ref 4.0–10.5)
nRBC: 0 % (ref 0.0–0.2)

## 2023-05-17 LAB — BASIC METABOLIC PANEL
Anion gap: 12 (ref 5–15)
BUN: 13 mg/dL (ref 8–23)
CO2: 24 mmol/L (ref 22–32)
Calcium: 9.9 mg/dL (ref 8.9–10.3)
Chloride: 100 mmol/L (ref 98–111)
Creatinine, Ser: 0.63 mg/dL (ref 0.44–1.00)
GFR, Estimated: >60 mL/min (ref >60)
Glucose, Bld: 93 mg/dL (ref 70–99)
Potassium: 3.6 mmol/L (ref 3.5–5.1)
Sodium: 136 mmol/L (ref 135–145)

## 2023-05-17 LAB — HEPATIC FUNCTION PANEL
ALT: 9 U/L (ref 0–44)
AST: 19 U/L (ref 15–41)
Albumin: 2.9 g/dL — ABNORMAL LOW (ref 3.5–5.0)
Alkaline Phosphatase: 56 U/L (ref 38–126)
Bilirubin, Direct: 0.2 mg/dL (ref 0.0–0.2)
Indirect Bilirubin: 0.9 mg/dL (ref 0.3–0.9)
Total Bilirubin: 1.1 mg/dL (ref 0.0–1.2)
Total Protein: 5.6 g/dL — ABNORMAL LOW (ref 6.5–8.1)

## 2023-05-17 NOTE — ED Provider Notes (Signed)
  Physical Exam  BP 100/61   Pulse 93   Temp 98.3 F (36.8 C) (Oral)   Resp 15   Ht 5\' 4"  (1.626 m)   Wt 54.4 kg   SpO2 98%   BMI 20.60 kg/m   Physical Exam Vitals and nursing note reviewed.  Constitutional:      General: She is not in acute distress.    Appearance: Normal appearance. She is well-developed.  HENT:     Head: Normocephalic and atraumatic.     Mouth/Throat:     Mouth: Mucous membranes are moist.  Eyes:     Pupils: Pupils are equal, round, and reactive to light.  Cardiovascular:     Rate and Rhythm: Normal rate and regular rhythm.     Heart sounds: No murmur heard. Pulmonary:     Effort: Pulmonary effort is normal. No respiratory distress.     Breath sounds: Normal breath sounds.  Abdominal:     General: Abdomen is flat.     Palpations: Abdomen is soft.     Tenderness: There is no abdominal tenderness.  Musculoskeletal:        General: No tenderness.     Right lower leg: No edema.     Left lower leg: No edema.  Skin:    General: Skin is warm and dry.  Neurological:     General: No focal deficit present.     Mental Status: She is alert. Mental status is at baseline.  Psychiatric:        Mood and Affect: Mood normal.        Behavior: Behavior normal.     ED Course / MDM   Clinical Course as of 05/17/23 2234  Fri May 17, 2023  1548 Received sign out from Dr. Rosalia Hammers pending laboratory workup. Patient presenting with ? AMS, but oriented x4. Some report of changes in urination. Coming from SNF.  [WS]  2158 Workup is largely unremarkable.  Urinalysis without evidence of any urinary infection.  CT head is negative.  Chest x-ray is negative.  Unclear what patient's baseline is, but here she is oriented x 4.  No evidence of any acute medical process.  She does not have any focal neurologic deficit to suggest acute stroke or other issue.  She denies really any complaints.  Do not think there is any indication for further workup in the emergency department.  Patient  is stable for discharge back to her skilled nursing facility. [WS]    Clinical Course User Index [WS] Lonell Grandchild, MD   Medical Decision Making Amount and/or Complexity of Data Reviewed Labs: ordered. Radiology: ordered.         Lonell Grandchild, MD 05/17/23 2234

## 2023-05-17 NOTE — ED Notes (Signed)
This paramedic and RN Toma Copier both attempted urinary in and out cath unsuccessfully. EDP aware

## 2023-05-17 NOTE — ED Provider Notes (Signed)
[place] EMERGENCY DEPARTMENT AT [facility] Provider Note   CSN: [id number] Arrival date & time: 05/17/23  1242     History  Chief Complaint  Patient presents with   Altered Mental Status   and decrease urine output    Rose Bridges is a 63 y.o. female.  HPI 63 year old female history of alcohol dependence, seizure disorder, osteogenesis imperfecta, pelvic fracture, tobacco use, acute pancreatitis, acute encephalopathy, depression, presents today from [facility].  Per EMS, facility reports that the patient has been having altered mental status and problems urinating for 3 days.  They report that patient answers questions appropriately    Home Medications Prior to Admission medications   Medication Sig Start Date End Date Taking? Authorizing Provider  acetaminophen (TYLENOL) 500 MG tablet Take 1,000 mg by mouth 3 (three) times daily.   Yes [provider]  aspirin 81 MG chewable tablet Chew 81 mg by mouth daily.   Yes [provider]  BIOFREEZE COOL THE PAIN 4 % GEL Apply 1 application  topically See admin instructions. Apply to both hands and knees 2 times a day   Yes [provider]  bisacodyl (DULCOLAX) 10 MG suppository Place 10 mg rectally daily as needed (for constipation).   Yes [provider]  calcitonin, salmon, (MIACALCIN/FORTICAL) 200 UNIT/ACT nasal spray Place 1 spray into alternate nostrils daily. Patient taking differently: 1 spray See admin instructions. Instill 1 spray into the right nostril once a day- per [facility] 04/08/22  Yes Celine Mans, MD  calcium carbonate (TUMS - DOSED IN MG ELEMENTAL CALCIUM) 500 MG chewable tablet Chew 1 tablet by mouth 2 (two) times daily.   Yes [provider]  celecoxib (CELEBREX) 50 MG capsule Take 50 mg by mouth daily. 05/06/23  Yes [provider]  cephALEXin (KEFLEX) 500 MG capsule Take 1 capsule (500 mg total) by mouth 3 (three) times daily. 05/11/23   Yes Derwood Kaplan, MD  DULoxetine (CYMBALTA) 30 MG capsule Take 1 capsule (30 mg total) by mouth daily. 04/12/23  Yes Glade Lloyd, MD  FIBER PO Take 0.4 g by mouth 3 (three) times daily between meals.   Yes [provider]  levETIRAcetam (KEPPRA) 500 MG tablet Take 1 tablet (500 mg total) by mouth 2 (two) times daily. 05/18/22  Yes Gherghe, Daylene Katayama, MD  Lidocaine 3 % CREA Apply 1 application  topically See admin instructions. Apply to both knees 3 times a day   Yes [provider]  magnesium oxide (MAG-OX) 400 (240 Mg) MG tablet Take 400 mg by mouth daily.   Yes [provider]  Menthol-Methyl Salicylate (MUSCLE RUB) 10-15 % CREA Apply 1 Application topically 2 (two) times daily as needed for muscle pain. 04/11/23  Yes Glade Lloyd, MD  oxyCODONE (OXY IR/ROXICODONE) 5 MG immediate release tablet Take 5 mg by mouth every 6 (six) hours as needed for moderate pain (pain score 4-6) or severe pain (pain score 7-10). 05/14/23  Yes [provider]  pantoprazole (PROTONIX) 40 MG tablet Take 40 mg by mouth daily.   Yes [provider]  polyethylene glycol (MIRALAX / GLYCOLAX) 17 g packet Take 17 g by mouth at bedtime as needed for mild constipation (mix into 6 ounces of fluid).   Yes [provider]  pregabalin (LYRICA) 100 MG capsule Take 100 mg by mouth 3 (three) times daily. 05/14/23  Yes [provider]  senna (SENOKOT) 8.6 MG TABS tablet Take 1 tablet by mouth daily as  needed for mild constipation.   Yes [provider]  Vitamin D, Ergocalciferol, (DRISDOL) 1.25 MG (50000 UNIT) CAPS capsule Take 1 capsule (50,000 Units total) by mouth every 7 (seven) days. Patient taking differently: Take 50,000 Units by mouth every Thursday. 04/12/22  Yes Celine Mans, MD  gabapentin (NEURONTIN) 300 MG capsule Take 1 capsule (300 mg total) by mouth 3 (three) times daily. Patient not taking: Reported on 05/17/2023 04/11/23   Glade Lloyd, MD       Allergies    Codeine    Review of Systems   Review of Systems  Physical Exam Updated Vital Signs BP (!) 118/54 (BP Location: Left Arm)   Pulse 69   Temp 98 F (36.7 C) (Oral)   Resp 17   Ht 1.626 m (5\' 4" )   Wt 54.4 kg   SpO2 98%   BMI 20.60 kg/m  Physical Exam Vitals reviewed.  HENT:     Head: Normocephalic.     Right Ear: External ear normal.     Left Ear: External ear normal.     Nose: Nose normal.     Mouth/Throat:     Pharynx: Oropharynx is clear.  Eyes:     Extraocular Movements: Extraocular movements intact.     Pupils: Pupils are equal, round, and reactive to light.  Cardiovascular:     Rate and Rhythm: Normal rate and regular rhythm.     Pulses: Normal pulses.     Heart sounds: Normal heart sounds.  Pulmonary:     Effort: Pulmonary effort is normal.     Breath sounds: Normal breath sounds.  Abdominal:     General: Abdomen is flat.     Palpations: Abdomen is soft.  Musculoskeletal:        General: Normal range of motion.     Cervical back: Normal range of motion.  Skin:    General: Skin is warm and dry.     Capillary Refill: Capillary refill takes less than 2 seconds.  Neurological:     Mental Status: She is alert. Mental status is at baseline.     Comments: Diffuse generalized weakness  Psychiatric:        Mood and Affect: Mood normal.     ED Results / Procedures / Treatments   Labs (all labs ordered are listed, but only abnormal results are displayed) Labs Reviewed  CBC  BASIC METABOLIC PANEL  ETHANOL  URINALYSIS, ROUTINE W REFLEX MICROSCOPIC  HEPATIC FUNCTION PANEL  AMMONIA    EKG None  Radiology No results found.  Procedures Procedures    Medications Ordered in ED Medications - No data to display  ED Course/ Medical Decision Making/ A&P Clinical Course as of 05/17/23 1609  Fri May 17, 2023  1548 Received sign out from Dr. Rosalia Hammers pending laboratory workup. Patient presenting with ? AMS, but oriented x4. Some report of  changes in urination. Coming from SNF.  [WS]    Clinical Course User Index [WS] Lonell Grandchild, MD                                 Medical Decision Making Amount and/or Complexity of Data Reviewed Labs: ordered.   Patient being evaluated for report of difficulty urinating and reports of ams. Here patient is alert and oriented x 3 She is not clear on why she is here in ED  Call placed to [facility], caregiver there states that patient  has been there for some time.  She is usually angry.  However, today she was different and that she seemed to be more confused.  She was yelling and stating that she was kidnapped.  They had recently treated her for UTI and thought that possibly her UTI was not resolving.  They report that she has not been on the facility and is unlikely that anybody has brought anything into her. Is being evaluated here with labs, imaging, LFT, and ammonia level.  Urinalysis has been ordered. Differential diagnosis includes but is not limited to acute intracranial event including hemorrhage or mass, acute metabolic abnormality including hyponatremia and hypoglycemia, infection, bleeding urinary tract infection, acute decompensation of [facility] disorders, substance use including alcohol. Labs are currently pending. Patient is hemodynamically stable and resting in bed Care has been discussed with Dr. Suezanne Jacquet and he has assumed care        Final Clinical Impression(s) / ED Diagnoses Final diagnoses:  Altered mental status, unspecified altered mental status type    Rx / DC Orders ED Discharge Orders     None         Margarita Grizzle, MD 05/17/23 1609

## 2023-05-17 NOTE — Discharge Instructions (Addendum)
We evaluated you for your confusion.  Your testing was reassuring in the emergency department.  We did not see any signs of a UTI or other problem.  Please follow-up with your primary doctor.

## 2023-05-17 NOTE — ED Notes (Signed)
Patient transported to CT 

## 2023-05-17 NOTE — ED Triage Notes (Signed)
Pt BIB [facility] from [facility]. The facility reported the patient has been having altered mental status and problem urinating for 3 days. EMS reported patient answers questions appropriately, that the only thing she thinks her family is out to get her. Patient is alert to person, place, time and events. Vital signs: BP 106/80, HR 90, saturation 99% RA and blood sugar 127.

## 2023-05-17 NOTE — ED Notes (Signed)
This paramedic called [facility] for report and was told pt nurse there would call me back. [employer] [phone]

## 2023-05-17 NOTE — ED Notes (Signed)
Attempted report x1 to [facility] and [address]; no answer

## 2023-05-17 NOTE — ED Notes (Signed)
Pt is stating that she can hear her "brother" talking out in the hallway. She continues to talk about money and her brother sending her to live at this place where they are not taking care of her. Pt is oriented when asked specific questions but then she rambles on about family and money. Pt advised her brother has not been here.

## 2023-05-17 NOTE — ED Notes (Signed)
Pt daughter in law at bedside that states pt is much worse than normal with her delusions.

## 2023-05-17 NOTE — ED Notes (Signed)
[name] called, 10 pickups ahead of patient

## 2023-05-18 NOTE — ED Notes (Signed)
Patient noted with a PICC line to her R upper arm, that was present on arrival.

## 2023-05-18 NOTE — Care Management (Signed)
Spoke to daughter Irving Burton, vented her frustration about no real answers about why her mother is having the confusion. She has been back and forth from hospital back to SNF. She was wondering about a psych evaluation, this is something that can be done as a outpatient. Still awaiting [name]

## 2023-05-18 NOTE — ED Notes (Signed)
Patient resting in hallway stretcher; updated patient on delay; no acute distress noted; awaiting ride with [name].

## 2023-05-18 NOTE — ED Notes (Signed)
Patient resting in stretcher in hallway, talking on her cell phone; no needs at this time; still waiting on [name] for ride home.

## 2023-05-28 ENCOUNTER — Emergency Department (HOSPITAL_COMMUNITY): Payer: Medicare Other

## 2023-05-28 ENCOUNTER — Emergency Department (HOSPITAL_COMMUNITY)
Admission: EM | Admit: 2023-05-28 | Discharge: 2023-06-05 | Disposition: A | Discharge status: Skilled Nursing Facility | Payer: Medicare Other | Attending: Emergency Medicine | Admitting: Emergency Medicine

## 2023-05-28 ENCOUNTER — Other Ambulatory Visit: Payer: Self-pay

## 2023-05-28 DIAGNOSIS — Z20822 Contact with and (suspected) exposure to covid-19: Secondary | ICD-10-CM | POA: Diagnosis not present

## 2023-05-28 DIAGNOSIS — Z91199 Patient's noncompliance with other medical treatment and regimen due to unspecified reason: Secondary | ICD-10-CM | POA: Diagnosis not present

## 2023-05-28 DIAGNOSIS — I10 Essential (primary) hypertension: Secondary | ICD-10-CM | POA: Insufficient documentation

## 2023-05-28 DIAGNOSIS — Z91148 Patient's other noncompliance with medication regimen for other reason: Secondary | ICD-10-CM | POA: Insufficient documentation

## 2023-05-28 DIAGNOSIS — B338 Other specified viral diseases: Secondary | ICD-10-CM

## 2023-05-28 DIAGNOSIS — F32A Depression, unspecified: Secondary | ICD-10-CM | POA: Diagnosis not present

## 2023-05-28 DIAGNOSIS — R404 Transient alteration of awareness: Secondary | ICD-10-CM | POA: Diagnosis not present

## 2023-05-28 DIAGNOSIS — Z8673 Personal history of transient ischemic attack (TIA), and cerebral infarction without residual deficits: Secondary | ICD-10-CM | POA: Diagnosis not present

## 2023-05-28 DIAGNOSIS — B974 Respiratory syncytial virus as the cause of diseases classified elsewhere: Secondary | ICD-10-CM | POA: Diagnosis not present

## 2023-05-28 DIAGNOSIS — R451 Restlessness and agitation: Secondary | ICD-10-CM | POA: Diagnosis not present

## 2023-05-28 DIAGNOSIS — R4182 Altered mental status, unspecified: Secondary | ICD-10-CM | POA: Insufficient documentation

## 2023-05-28 DIAGNOSIS — F329 Major depressive disorder, single episode, unspecified: Secondary | ICD-10-CM | POA: Diagnosis present

## 2023-05-28 LAB — URINALYSIS, ROUTINE W REFLEX MICROSCOPIC
Bilirubin Urine: NEGATIVE
Glucose, UA: NEGATIVE mg/dL
Ketones, ur: 20 mg/dL — AB
Nitrite: NEGATIVE
Protein, ur: 30 mg/dL — AB
RBC / HPF: >50 RBC/hpf (ref 0–5)
Specific Gravity, Urine: 1.027 (ref 1.005–1.030)
WBC, UA: >50 WBC/hpf (ref 0–5)
pH: 5 (ref 5.0–8.0)

## 2023-05-28 LAB — COMPREHENSIVE METABOLIC PANEL
ALT: 15 U/L (ref 0–44)
AST: 26 U/L (ref 15–41)
Albumin: 2.6 g/dL — ABNORMAL LOW (ref 3.5–5.0)
Alkaline Phosphatase: 82 U/L (ref 38–126)
Anion gap: 11 (ref 5–15)
BUN: 12 mg/dL (ref 8–23)
CO2: 26 mmol/L (ref 22–32)
Calcium: 9.1 mg/dL (ref 8.9–10.3)
Chloride: 103 mmol/L (ref 98–111)
Creatinine, Ser: 0.52 mg/dL (ref 0.44–1.00)
GFR, Estimated: >60 mL/min (ref >60)
Glucose, Bld: 115 mg/dL — ABNORMAL HIGH (ref 70–99)
Potassium: 3.9 mmol/L (ref 3.5–5.1)
Sodium: 140 mmol/L (ref 135–145)
Total Bilirubin: 0.7 mg/dL (ref 0.0–1.2)
Total Protein: 6.2 g/dL — ABNORMAL LOW (ref 6.5–8.1)

## 2023-05-28 LAB — CBC
HCT: 46.1 % — ABNORMAL HIGH (ref 36.0–46.0)
Hemoglobin: 14.8 g/dL (ref 12.0–15.0)
MCH: 33.4 pg (ref 26.0–34.0)
MCHC: 32.1 g/dL (ref 30.0–36.0)
MCV: 104.1 fL — ABNORMAL HIGH (ref 80.0–100.0)
Platelets: 218 10*3/uL (ref 150–400)
RBC: 4.43 MIL/uL (ref 3.87–5.11)
RDW: 15 % (ref 11.5–15.5)
WBC: 4.8 10*3/uL (ref 4.0–10.5)
nRBC: 0 % (ref 0.0–0.2)

## 2023-05-28 LAB — I-STAT CG4 LACTIC ACID, ED: Lactic Acid, Venous: 2.2 mmol/L (ref 0.5–1.9)

## 2023-05-28 LAB — CBG MONITORING, ED: Glucose-Capillary: 97 mg/dL (ref 70–99)

## 2023-05-28 MED ORDER — LEVETIRACETAM 500 MG PO TABS
500.0000 mg | ORAL_TABLET | Freq: Two times a day (BID) | ORAL | Status: DC
  Administered 2023-05-29 – 2023-06-05 (×14): 500 mg via ORAL
  Filled 2023-05-28 (×15): qty 1

## 2023-05-28 MED ORDER — FOSFOMYCIN TROMETHAMINE 3 G PO PACK
3.0000 g | PACK | Freq: Once | ORAL | Status: AC
  Administered 2023-05-28: 3 g via ORAL
  Filled 2023-05-28: qty 3

## 2023-05-28 MED ORDER — DULOXETINE HCL 30 MG PO CPEP
30.0000 mg | ORAL_CAPSULE | Freq: Every day | ORAL | Status: DC
  Administered 2023-05-31 – 2023-06-02 (×3): 30 mg via ORAL
  Filled 2023-05-28 (×6): qty 1

## 2023-05-28 MED ORDER — ASPIRIN 81 MG PO CHEW
81.0000 mg | CHEWABLE_TABLET | Freq: Every day | ORAL | Status: DC
  Administered 2023-05-31 – 2023-06-05 (×6): 81 mg via ORAL
  Filled 2023-05-28 (×8): qty 1

## 2023-05-28 MED ORDER — SODIUM CHLORIDE 0.9 % IV BOLUS: 1000.0000 mL | Freq: Once | INTRAVENOUS | Status: DC

## 2023-05-28 NOTE — ED Notes (Signed)
In and out cath attempted and was successful however no urine return.  Will attempt again after pt has had fluids.

## 2023-05-28 NOTE — ED Notes (Signed)
Pt refusing all po intake

## 2023-05-28 NOTE — BH Assessment (Signed)
TTS attempted to assess pt. Pt informed RN that she declines to participate in an assessment. RN will notify TTS when pt is willing to participate in a telehealth assessment.

## 2023-05-28 NOTE — ED Notes (Signed)
Patient transported to CT

## 2023-05-28 NOTE — ED Triage Notes (Signed)
Pt BIBA from [facility]. Pt has been refusing all medication and care. Has been laying in own urine and feces for days. Pt is alert, but refuses to answer questions. C/o pain in her L knee.  Pt was brought in under IVC.  Pt covered in stool.

## 2023-05-28 NOTE — ED Notes (Signed)
Belongings have been placed behind nurses triage station of 1-8. Two pt belongings bags

## 2023-05-28 NOTE — ED Notes (Signed)
IV attempt x2.

## 2023-05-28 NOTE — ED Notes (Signed)
Pt was cleaned up and changed

## 2023-05-28 NOTE — ED Provider Notes (Signed)
Emergency Department Provider Note   I have reviewed the triage vital signs and the nursing notes.   HISTORY  Chief Complaint IVC and Altered Mental Status   HPI Rose Bridges is a 63 y.o. female past history of bipolar disorder, hypertension, hyperlipidemia, and prior CVA presents to the emergency department with increased confusion and agitation toward her nursing facility staff.  She is currently in a skilled nursing facility [facility]) and apparently been refusing all care.  She has not been taking her medications.  Staff describe her as being increasingly confused, agitated, aggressive at times toward them.  She has not been allowing them to care for her and arrives covered in urine and feces. IVC paperwork filed by staff.   Patient denies any pain. She is confused about her location and tells me that she is "waiting on the girl" but cannot describe further what she means.   Level 5 caveat: AMS   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[facility])    Metabolic encephalopathy    Osteogenesis imperfecta    Osteogenesis imperfecta    Osteoporosis    Personality disorder (HCC)    PVD (peripheral vascular disease) (HCC)    Stroke (HCC)     Review of Systems  Level 5 caveat: AMS  ____________________________________________   PHYSICAL EXAM:  VITAL SIGNS: ED Triage Vitals  Encounter Vitals Group     BP 05/28/23 1313 128/87     Pulse Rate 05/28/23 1313 (!) 118     Resp 05/28/23 1313 18     Temp 05/28/23 1313 98.2 F (36.8 C)     Temp Source 05/28/23 1313 Oral     SpO2 05/28/23 1309 92 %     Weight 05/28/23 1314 119 lb 0.8  oz (54 kg)     Height 05/28/23 1314 5\' 4"  (1.626 m)   Constitutional: Alert but confused. No distress.  Eyes: Conjunctivae are normal.  Head: Atraumatic. Nose: No congestion/rhinnorhea. Mouth/Throat: Mucous membranes are slightly dry.  Neck: No stridor.  Cardiovascular: Tachycardia. Good peripheral circulation. Grossly normal heart sounds.   Respiratory: Normal respiratory effort.  No retractions. Lungs CTAB. Gastrointestinal: Soft and nontender. No distention.  Musculoskeletal: No gross deformities of extremities. Neurologic:  Normal speech and language. No gross focal neurologic deficits are appreciated.  Skin:  Skin is warm, dry and intact. No rash noted.  ____________________________________________   LABS (all labs ordered are listed, but only abnormal results are displayed)  Labs Reviewed  URINE CULTURE - Abnormal; Notable for the following components:      Result Value   Culture MULTIPLE SPECIES PRESENT, SUGGEST RECOLLECTION (*)    All other components within normal limits  COMPREHENSIVE METABOLIC PANEL - Abnormal; Notable for the following components:   Glucose, Bld 115 (*)    Total Protein 6.2 (*)    Albumin 2.6 (*)    All other components within normal limits  CBC - Abnormal; Notable for the following components:   HCT 46.1 (*)    MCV 104.1 (*)    All other components within normal limits  URINALYSIS, ROUTINE W REFLEX MICROSCOPIC -  Abnormal; Notable for the following components:   Color, Urine AMBER (*)    APPearance CLOUDY (*)    Hgb urine dipstick LARGE (*)    Ketones, ur 20 (*)    Protein, ur 30 (*)    Leukocytes,Ua MODERATE (*)    Bacteria, UA RARE (*)    Non Squamous Epithelial 0-5 (*)    All other components within normal limits  I-STAT CG4 LACTIC ACID, ED - Abnormal; Notable for the following components:   Lactic Acid, Venous 2.2 (*)    All other components within normal limits  CBG MONITORING, ED    ____________________________________________  EKG   EKG Interpretation Date/Time:  Tuesday May 28 2023 13:35:00 EST Ventricular Rate:  139 PR Interval:  136 QRS Duration:  83 QT Interval:  262 QTC Calculation: 399 R Axis:   83  Text Interpretation: Sinus tachycardia Borderline right axis deviation Low voltage, precordial leads Abnormal T, consider ischemia, diffuse leads Artifact in lead(s) I II III aVR aVL aVF V5 Similar to Dec 8th tracing Confirmed by [name] [phone]) on 05/28/2023 4:06:47 PM        ____________________________________________  RADIOLOGY  No results found.   ____________________________________________   PROCEDURES  Procedure(s) performed:   Procedures  None  ____________________________________________   INITIAL IMPRESSION / ASSESSMENT AND PLAN / ED COURSE  Pertinent labs & imaging results that were available during my care of the patient were reviewed by me and considered in my medical decision making (see chart for details).   This patient is Presenting for Evaluation of AMS, which does require a range of treatment options, and is a complaint that involves a high risk of morbidity and mortality.  The Differential Diagnoses includes but is not exclusive to alcohol, illicit or prescription medications, intracranial pathology such as stroke, intracerebral hemorrhage, fever or infectious causes including sepsis, hypoxemia, uremia, trauma, endocrine related disorders such as diabetes, hypoglycemia, thyroid-related diseases, etc.   Critical Interventions-    Medications  aspirin chewable tablet 81 mg (81 mg Oral Patient Refused/Not Given 05/30/23 1803)  DULoxetine (CYMBALTA) DR capsule 30 mg (30 mg Oral Patient Refused/Not Given 05/30/23 1803)  levETIRAcetam (KEPPRA) tablet 500 mg (500 mg Oral Given 05/30/23 2013)  fosfomycin (MONUROL) packet 3 g (3 g Oral Given 05/28/23 2052)    Reassessment after intervention: Tachycardia improved.     I did obtain Additional Historical Information from EMS and IVC paperwork, as the patient is altered.  I decided to review pertinent External Data, and in summary patient with multiple ED visits for AMS with no clear etiology.   Clinical Laboratory Tests Ordered, included CBC without leukocytosis or anemia.  Minimally elevated lactic acid of 2.2.  Doubt sepsis.  Favor mild dehydration.  CMP shows normal LFTs, bilirubin, creatinine. UA equivocal for UTI. Will send for culture and treat with single dose fosfomycin.  Radiologic Tests Ordered, included CT head. I independently interpreted the images and agree with radiology interpretation.   Cardiac Monitor Tracing which shows NSR.    Social Determinants of Health Risk patient is not an active smoker.   Consult complete with TTS.  Medical Decision Making: Summary:  Patient presents to the emergency department with altered mental status.  She has had multiple presentations for similar.  Has had UTI in the past which is on the differential but overall affect seems mildly agitated but muted.  Question psych component. Has been working with psychiatry at her facility with minimal improvement. Now refusing all meds and care at  her facility. I have upheld the IVC with first exam completed upon arrival.   Reevaluation with update and discussion with patient.  Remains awake and alert.  UA is equivocal.  Will send culture and cover with single dose fosfomycin but lower suspicion that this represents a true UTI.  She has no leukocytosis.  She has multiple ED presentations with similar behavior with no evidence of UTI or other clear etiology.  I do not see that she has been evaluated by psychiatry.  Her facility has been attempting to have her see a psychiatrist and IVC to her in the past with this in mind but it had been reversed. She is drinking fluids here in the ED. Will ask TTS to evaluate.   Considered admission but plan for TTS evaluation.    Patient's presentation is most consistent with acute presentation with potential threat to life or bodily function.   Disposition: pending  ____________________________________________  FINAL CLINICAL IMPRESSION(S) / ED DIAGNOSES  Final diagnoses:  Transient alteration of awareness    Note:  This document was prepared using Dragon voice recognition software and may include unintentional dictation errors.  [name], MD, [facility]    Zaiya Annunziato, Arlyss Repress, MD 05/31/23 [phone]

## 2023-05-29 DIAGNOSIS — F329 Major depressive disorder, single episode, unspecified: Secondary | ICD-10-CM | POA: Diagnosis not present

## 2023-05-29 DIAGNOSIS — F32A Depression, unspecified: Secondary | ICD-10-CM

## 2023-05-29 DIAGNOSIS — R404 Transient alteration of awareness: Secondary | ICD-10-CM

## 2023-05-29 DIAGNOSIS — Z91199 Patient's noncompliance with other medical treatment and regimen due to unspecified reason: Secondary | ICD-10-CM | POA: Diagnosis not present

## 2023-05-29 NOTE — ED Notes (Signed)
Pt refused meds and water

## 2023-05-29 NOTE — ED Provider Notes (Signed)
Patient is boarding here pending psychiatric evaluation after being sent from her facility with concern for behavioral change.  Patient was seen by ED provider yesterday, who notes that her workup was notable only for leukocytes on UA.  This is equivocal for infection but she was treated with fosfomycin.  Urine culture will be sent.  Otherwise there were no emergent findings on her workup.  ED provider had requested TTS consultation, per Dr. Edwin Dada pending ED assessment:  Reevaluation with update and discussion with patient.  Remains awake and alert.  UA is equivocal.  Will send culture and cover with single dose fosfomycin but lower suspicion that this represents a true UTI.  She has no leukocytosis.  She has multiple ED presentations with similar behavior with no evidence of UTI or other clear etiology.  I do not see that she has been evaluated by psychiatry.  Her facility has been attempting to have her see a psychiatrist and IVC to her in the past with this in mind but it had been reversed. She is drinking fluids here in the ED. Will ask TTS to evaluate.     Terald Sleeper, MD 05/29/23 [phone]

## 2023-05-29 NOTE — Progress Notes (Signed)
Inpatient Behavioral Health Placement   This CSW received a phone call from [facility] and old Vevelyn Francois that pt has been denied due to high medical acuity. This Csw/ Disposition team will assist and follow with [facility] placement as reccommended.   Maryjean Ka, MSW, [facility] 05/29/2023 9:51 PM

## 2023-05-29 NOTE — Consult Note (Signed)
[facility] Psychiatric Consult Initial  Patient Name: .Rose Bridges  MRN: [id number]  DOB: Feb 12, 1961  Consult Order details:  Orders (From admission, onward)     Start     Ordered   05/28/23 2109  CONSULT TO CALL ACT TEAM       Ordering Provider: Maia Plan, MD  Provider:  (Not yet assigned)  Question:  Reason for Consult?  Answer:  Psych consult   05/28/23 2109             Mode of Visit: In person    Psychiatry Consult Evaluation  Service Date: May 29, 2023 LOS:  LOS: 0 days  Chief Complaint "No"  Primary Psychiatric Diagnoses  Medically Noncompliant Depression  Assessment  [name] is a 63 y.o. female admitted: Presented to the EDfor 05/28/2023 12:50 PM for brought in by EMS from [facility] . She carries the psychiatric diagnoses of alcohol dependence, depression, anxiety and altered mental status and has a past medical history of epilepsy, osteogenesis imperfecta, pelvic fracture.   Her current presentation of saying "no" to answer most questions and refusing medications is most consistent with medical noncompliance. Current outpatient psychotropic medications include cymbalta and ativan prn and historically she has had a moderate response to these medications. She was not compliant with medications prior to admission as evidenced by nursing home report that she refuses to take medication. On initial examination, patient refused to engage with the assessment. Reassessment was attempted with family member on the phone and patient agreed to participate partially. Please see plan below for detailed recommendations.   Diagnoses:  Active Hospital problems: Principal Problem:   Medically noncompliant    Plan   ## Psychiatric Medication Recommendations:  Restart  -Cymbalta 30mg  PO Q day  ## Medical Decision Making Capacity: Not specifically addressed in this encounter  ## Further Work-up:  -- most recent EKG on 05/28/2023 had QtC of 399 --  Pertinent labwork reviewed earlier this admission includes: CBC, CMP, and urinalysis   ## Disposition:-- We recommend inpatient psychiatric hospitalization after medical hospitalization. Patient has been involuntarily committed on 05/28/2023.   ## Behavioral / Environmental: -Utilize compassion and acknowledge the patient's experiences while setting clear and realistic expectations for care.    ## Safety and Observation Level:  - Based on my clinical evaluation, I estimate the patient to be at low risk of self harm in the current setting. - At this time, we recommend  routine. This decision is based on my review of the chart including patient's history and current presentation, interview of the patient, mental status examination, and consideration of suicide risk including evaluating suicidal ideation, plan, intent, suicidal or self-harm behaviors, risk factors, and protective factors. This judgment is based on our ability to directly address suicide risk, implement suicide prevention strategies, and develop a safety plan while the patient is in the clinical setting. Please contact our team if there is a concern that risk level has changed.  CSSR Risk Category:C-SSRS RISK CATEGORY: No Risk  Suicide Risk Assessment: Patient has following modifiable risk factors for suicide: under treated depression  and medication noncompliance, which we are addressing by recommending inpatient psychiatric hospitalization. Patient has following non-modifiable or demographic risk factors for suicide: None Patient has the following protective factors against suicide: Supportive family  Thank you for this consult request. Recommendations have been communicated to the primary team.  We will continue to follow at this time.   Thomes Lolling, NP  History of Present Illness  Relevant Aspects of Hospital ED Course:  Admitted on 05/28/2023 for brought in by EMS from [facility] . She carries the psychiatric  diagnoses of alcohol dependence, depression, anxiety and altered mental status and has a past medical history of epilepsy, osteogenesis imperfecta, pelvic fracture.    Patient Report:  Rose Bridges, 63 y.o., female patient seen face to face by this provider, consulted with Dr. Enedina Finner; and chart reviewed on 05/29/23.  On evaluation [name] reports she feels "like shit" and that she isn't taking her medication because she doesn't want to.  She says she doesn't want to take any medication because it "doesn't matter".  Patient seems to be depressed and is unhappy that her daughter in law and granddaughter will be living so far away.  Patient continues to refuse all her medications.    During evaluation Rose Bridges is laying in bed in no acute distress.  She is alert, oriented x 3, cooperative with second assessment with daughter in law and attentive.  Her mood is depressed with congruent affect.  She has normal speech, and behavior.  Objectively there is no evidence of psychosis/mania or delusional thinking.  Patient is able to converse coherently, goal directed thoughts, no distractibility, or pre-occupation. She denies suicidal/self-harm/homicidal ideation, psychosis, and paranoia.  Patient answered questions. appropriately.    Psych ROS:  Depression: yes Anxiety:  no Mania (lifetime and current): denies Psychosis: (lifetime and current): denies  Collateral information:  Contacted Preciosa Bundrick at [phone] on 05/29/2023  Patient lost her mother last year who she lived with; her mother had experienced strokes.  Patient's mother was opposed to mental health treatment and had strokes. Sister had bipolar disorder and passed away recently.  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  She is very close to [place] and Emily's daughter.  [place] because the daughter is going to college there in the fall.   Initially patient was going to to [place] too, but when she became unable to walk, she decided she couldn't go. Patient's refusal to get up and do things caused a lot of muscle atrophy and now she is unable to get up. This is patient's typical pattern. She will refuse to do what she should and then she will say "see it doesn't matter."  Irving Burton said that the patient sounded like her normal self.    Review of Systems  Musculoskeletal:        Patient has chronic pain.  Psychiatric/Behavioral:  Positive for depression.   All other systems reviewed and are negative.    Psychiatric and Social History  Psychiatric History:  Information collected from patient and Allayna Erlich  Prev Dx/Sx: alcohol dependence, depression, anxiety and altered mental status Current Psych Provider: none Home Meds (current): cymbalta Previous Med Trials: unknown Therapy: none  Prior Psych Hospitalization: none  Prior Self Harm: none Prior Violence: none  Family Psych History: Sister - bipolar Family Hx suicide: none  Social History:  Developmental Hx: unknown Educational Hx: unknown Occupational Hx: not employed Armed forces operational officer Hx: none Living Situation: SNF Spiritual Hx: none Access to weapons/lethal means: none   Substance History Alcohol: HX of alcohol abuse - not currently drinking  Tobacco: none Illicit drugs: none Prescription drug abuse: none Rehab hx: none  Exam Findings  Physical Exam:  Vital Signs:  Temp:  [97.6 F (36.4 C)-98.2 F (36.8 C)] 97.9 F (36.6 C) (01/29  1149) Pulse Rate:  [67-102] 67 (01/29 1149) Resp:  [16-20] 20 (01/29 1149) BP: (112-121)/(79-87) 112/84 (01/29 1149) SpO2:  [93 %-96 %] 96 % (01/29 1149) Blood pressure 112/84, pulse 67, temperature 97.9 F (36.6 C), temperature source Oral, resp. rate 20, height 5\' 4"  (1.626 m), weight 54 kg, SpO2 96%. Body mass index is 20.43 kg/m.  Physical Exam Vitals and nursing note reviewed.  Eyes:     Pupils: Pupils are equal, round,  and reactive to light.  Pulmonary:     Effort: Pulmonary effort is normal.  Neurological:     Mental Status: She is alert.     Motor: Weakness present.  Psychiatric:        Attention and Perception: Attention and perception normal.        Mood and Affect: Mood is depressed.        Speech: Speech normal.        Behavior: Behavior is uncooperative and agitated.        Thought Content: Thought content normal.        Cognition and Memory: Cognition and memory normal.        Judgment: Judgment is inappropriate.     Mental Status Exam: General Appearance: Casual  Orientation:  Full (Time, Place, and Person)  Memory:  Immediate;   Good Recent;   Good Remote;   Good  Concentration:  Concentration: Good  Recall:  Good  Attention  Good  Eye Contact:  Good  Speech:  Clear and Coherent  Language:  Good  Volume:  Normal  Mood: depressed   Affect:  Congruent  Thought Process:  Coherent  Thought Content:  WDL  Suicidal Thoughts:  No  Homicidal Thoughts:  No  Judgement:  Impaired  Insight:  Shallow  Psychomotor Activity:   Non ambulatory  Akathisia:  Negative  Fund of Knowledge:  Good      Assets:  Leisure Time Social Support  Cognition:  WNL  ADL's:  Impaired  AIMS (if indicated):        Other History   These have been pulled in through the EMR, reviewed, and updated if appropriate.  Family History:  The patient's family history includes Heart attack in her mother; Hypertension in her mother; Lung cancer in her father; Stroke in her brother, mother, and sister.  Medical History: Past Medical History:  Diagnosis Date  . Agoraphobia   . Arthritis   . Bipolar disorder (HCC)   . Degeneration of lumbar intervertebral disc   . Disseminated intravascular coagulation (HCC)   . Epilepsy (HCC)   . Fracture of unspecified parts of lumbosacral spine and pelvis, sequela   . History of falling   . Hyperlipemia   . Hypertension   . 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(HCC)   . Metabolic encephalopathy   . Osteogenesis imperfecta   . Osteogenesis imperfecta   . Osteoporosis   . Personality disorder (HCC)   . PVD (peripheral vascular disease) (HCC)   . Stroke [facility])     Surgical History: Past Surgical History:  Procedure Laterality Date  . ABDOMINAL HYSTERECTOMY    . BIOPSY  07/05/2022   Procedure: BIOPSY;  Surgeon: Lemar Lofty., MD;  Location: [facility];  Service: Gastroenterology;;  . ESOPHAGOGASTRODUODENOSCOPY N/A 07/05/2022   Procedure: ESOPHAGOGASTRODUODENOSCOPY (EGD);  Surgeon: Lemar Lofty., MD;  Location: [facility];  Service: Gastroenterology;  Laterality: N/A;  . EUS N/A 07/05/2022   Procedure: UPPER ENDOSCOPIC ULTRASOUND (EUS) LINEAR;  Surgeon: Lemar Lofty., MD;  Location: [facility];  Service: Gastroenterology;  Laterality: N/A;  . HIP ARTHROPLASTY Left 09/26/2015   Procedure: ARTHROPLASTY BIPOLAR HIP (HEMIARTHROPLASTY);  Surgeon: Sheral Apley, MD;  [facility];  Service: Orthopedics;  Laterality: Left;  . ORIF HUMERUS FRACTURE Right 11/06/2016   Procedure: OPEN REDUCTION INTERNAL FIXATION (ORIF) PROXIMAL HUMERUS FRACTURE;  Surgeon: Sheral Apley, MD;  Location: [facility] OR;  Service: Orthopedics;  Laterality: Right;     Medications:   Current Facility-Administered Medications:  .  aspirin chewable tablet 81 mg, 81 mg, Oral, Daily, Long, Arlyss Repress, MD .  DULoxetine (CYMBALTA) DR capsule 30 mg, 30 mg, Oral, Daily, Long, Arlyss Repress, MD .  levETIRAcetam (KEPPRA) tablet 500 mg, 500 mg, Oral, BID, Long, Arlyss Repress, MD  Current Outpatient Medications:  .  acetaminophen (TYLENOL) 500 MG tablet, Take 1,000 mg by mouth 3 (three) times daily., Disp: , Rfl:  .  aspirin 81 MG chewable tablet, Chew 81 mg by mouth daily., Disp: , Rfl:  .  BIOFREEZE COOL THE PAIN 4 % GEL, Apply 1 application  topically See admin instructions. Apply to both hands and knees 2 times a day, Disp: , Rfl:  .  bisacodyl (DULCOLAX) 10 MG  suppository, Place 10 mg rectally daily as needed (for constipation)., Disp: , Rfl:  .  calcitonin, salmon, (MIACALCIN/FORTICAL) 200 UNIT/ACT nasal spray, Place 1 spray into alternate nostrils daily. (Patient taking differently: 1 spray See admin instructions. Instill 1 spray into the right nostril once a day- per [facility]), Disp: 3.7 mL, Rfl: 12 .  calcium carbonate (TUMS - DOSED IN MG ELEMENTAL CALCIUM) 500 MG chewable tablet, Chew 1 tablet by mouth 2 (two) times daily., Disp: , Rfl:  .  celecoxib (CELEBREX) 50 MG capsule, Take 50 mg by mouth daily., Disp: , Rfl:  .  DULoxetine (CYMBALTA) 30 MG capsule, Take 1 capsule (30 mg total) by mouth daily., Disp: 30 capsule, Rfl: 0 .  FIBER PO, Take 0.4 g by mouth 3 (three) times daily between meals., Disp: , Rfl:  .  levETIRAcetam (KEPPRA) 500 MG tablet, Take 1 tablet (500 mg total) by mouth 2 (two) times daily., Disp: , Rfl:  .  Lidocaine 3 % CREA, Apply 1 application  topically See admin instructions. Apply to both knees 3 times a day, Disp: , Rfl:  .  LORazepam (ATIVAN) 0.5 MG tablet, Take 0.5 mg by mouth 2 (two) times daily as needed for anxiety., Disp: , Rfl:  .  magnesium oxide (MAG-OX) 400 (240 Mg) MG tablet, Take 400 mg by mouth daily., Disp: , Rfl:  .  Menthol-Methyl Salicylate (MUSCLE RUB) 10-15 % CREA, Apply 1 Application topically 2 (two) times daily as needed for muscle pain., Disp: , Rfl:  .  oxyCODONE (OXY IR/ROXICODONE) 5 MG immediate release tablet, Take 5 mg by mouth every 6 (six) hours as needed for moderate pain (pain score 4-6) or severe pain (pain score 7-10)., Disp: , Rfl:  .  pantoprazole (PROTONIX) 40 MG tablet, Take 40 mg by mouth daily., Disp: , Rfl:  .  polyethylene glycol (MIRALAX / GLYCOLAX) 17 g packet, Take 17 g by mouth at bedtime as needed for mild constipation (mix into 6 ounces of fluid)., Disp: , Rfl:  .  pregabalin (LYRICA) 100 MG capsule, Take 100 mg by mouth 3 (three) times daily., Disp: , Rfl:  .  senna (SENOKOT) 8.6 MG  TABS tablet, Take 1 tablet by mouth daily as needed for mild constipation., Disp: , Rfl:  .  Vitamin D, Ergocalciferol, (DRISDOL) 1.25 MG (50000 UNIT) CAPS capsule, Take 1 capsule (50,000 Units total) by mouth every 7 (seven) days. (Patient taking differently: Take 50,000 Units by mouth every Thursday.), Disp: 5 capsule, Rfl:  .  cephALEXin (KEFLEX) 500 MG capsule, Take 1 capsule (500 mg total) by mouth 3 (three) times daily. (Patient not taking: Reported on 05/28/2023), Disp: 20 capsule, Rfl: 0 .  gabapentin (NEURONTIN) 300 MG capsule, Take 1 capsule (300 mg total) by mouth 3 (three) times daily. (Patient not taking: Reported on 05/17/2023), Disp: 10 capsule, Rfl: 0  Allergies: Allergies  Allergen Reactions  . Codeine Other (See Comments)    Migraine     Thomes Lolling, NP

## 2023-05-29 NOTE — Progress Notes (Signed)
LCSW Progress Note  [id number]   Rose Bridges  05/29/2023  9:24 PM    Inpatient Behavioral Health Placement  Pt meets inpatient criteria per Joaquim Nam. There are no available beds within [facility] per [facility] [employer]. Referral was sent to the following facilities;   Destination  Service Provider Address Phone [facility] [address] [place], [place] [address] [phone]  [facility] [address] [place] [address] [phone]  [facility] [address] [place] [address] [phone]  [facility] [address] [place], [place] [address] [phone]  [facility] [address] [place] [address] [phone]  [facility] [address] [place] [address] [phone]  [facility] [address] [place], [place] [address] [phone]  [facility] [address] [place] [address] [phone]  [facility] [address] [place] [address] [phone]  [facility] [address], [place] [address] [phone]  [facility] [address] [place] [address] [phone]  [facility] [address], [place] [address] [phone]  [facility] [address], [place] [address] [phone]  [facility] [address], [place] [address] [phone]  [facility] [address] [place] [address] [phone]  [facility] EFAX [address] Karolee Ohs  [place] [phone]  [facility] [address], [place] [address] [phone]  [facility] [address], [place] [address] [phone]  [facility] [address], [place] [address] [phone]  [facility] [address], [place] [address] [phone]  [facility] [address] [place] [address] [phone]  [facility] [address] [place] [address] [phone]  [facility]patient [facility] [place] [phone]  [facility] [address] [place] [address] [phone]  [facility] [address] [place] [address] [phone]  [facility] Patient Placement [facility], [place] [phone]  [facility] [address] [place] [address] [phone]  [facility] [address] [place] [address] [phone]  [facility] 1 medical [place] [address] [phone]     Situation ongoing,  CSW will follow up.    Maryjean Ka, MSW, [facility] 05/29/2023 9:24 PM

## 2023-05-29 NOTE — ED Notes (Signed)
PT stated,"I know what Keppra is for and I am not taking it." Also refused Aspirin saying "I don't need that." And Cymbalta she stated, "No!".

## 2023-05-30 DIAGNOSIS — F329 Major depressive disorder, single episode, unspecified: Secondary | ICD-10-CM | POA: Diagnosis not present

## 2023-05-30 LAB — URINE CULTURE: Culture: >=100000 — AB

## 2023-05-30 NOTE — Progress Notes (Signed)
LCSW Progress Note  [id number]   Rose Bridges  05/30/2023  2:32 PM  Description:   Inpatient Psychiatric Referral  Patient was recommended inpatient per [place], PMHNP. There are no [facility] beds at [facility], per [facility] RN. Patient was referred to the following out of network facilities:    Production assistant, radio Address Phone Fax  [facility] [place] [address] [place], [place] [address] [phone]  [facility] [address] [place] [address] [phone]  [facility] [address] [place] [address] [phone]  [facility] [address] [place], [place] [address] [phone]  [facility] [address], [place] [address] [phone]  [facility] [address] [place] [address] [phone]  [facility] [address] [place], [place] [address] [phone]  [facility] [address] [place] [address] [phone]  [facility] [address] [place] [address] [phone]  [facility] [address] [place] [address] [phone]  [facility] [address], [place] [address] [phone]  [facility] [address] [place] [address] [phone]  [facility] [address], [place] [address] [phone]  [facility] [address] [place] [address] [phone]  [facility] [address] [place] [address] [phone]  [facility] EFAX  [address] Rose Bridges [place] [phone]  [facility] [address], [place] [address] [phone]  [facility] [address] [place] [address] [phone]  [facility] [address], [place] [address] [phone]  [facility] [address] [place], [place] [address] [phone]  [facility] [address] [place] [address] [phone]  [facility] [address] [place] [address] [phone]  [facility] Psychiatry Inpatient [facility] [place] [phone]  [facility] [address] [place] [address] [phone]  [facility] [address] [place] [address] [phone]  [facility] Patient Placement [facility], [place] [phone]  [facility] [address] [place] [address] [phone]  [facility] [address] [place] [address] [phone]  [facility] 1 medical Center [place] [address] [phone]      Situation ongoing, CSW to continue following and update chart as more information becomes available.      [name], MSW, LCSW  05/30/2023 2:32 PM

## 2023-05-30 NOTE — ED Provider Notes (Signed)
Emergency Medicine Observation Re-evaluation Note  Rose Bridges is a 63 y.o. female, seen on rounds today.  Pt initially presented to the ED for complaints of IVC and Altered Mental Status Currently, the patient is sleeping.  Physical Exam  BP 128/74 (BP Location: Left Arm)   Pulse 83   Temp 98.3 F (36.8 C) (Oral)   Resp 18   Ht 5\' 4"  (1.626 m)   Wt 54 kg   SpO2 97%   BMI 20.43 kg/m  Physical Exam General: NAD Cardiac: good peripheral perfusion Lungs: bilateral chest rise, no tachypnea Psych: resting comfortably  ED Course / MDM  EKG:EKG Interpretation Date/Time:  Tuesday May 28 2023 13:35:00 EST Ventricular Rate:  139 PR Interval:  136 QRS Duration:  83 QT Interval:  262 QTC Calculation: 399 R Axis:   83  Text Interpretation: Sinus tachycardia Borderline right axis deviation Low voltage, precordial leads Abnormal T, consider ischemia, diffuse leads Artifact in lead(s) I II III aVR aVL aVF V5 Similar to Dec 8th tracing Confirmed by [name] [phone]) on 05/28/2023 4:06:47 PM  I have reviewed the labs performed to date as well as medications administered while in observation.  Recent changes in the last 24 hours include seen by psych and recommend inpatient.  Plan  Current plan is for psych placement.    Melene Plan, DO 05/30/23 [phone]

## 2023-05-30 NOTE — ED Notes (Signed)
Patient calm and cooperative.

## 2023-05-30 NOTE — Consult Note (Signed)
[facility] Consult Follow-up  Patient Name: .Rose Bridges  MRN: [id number]  DOB: 12-Aug-1960  Consult Order details:  Orders (From admission, onward)     Start     Ordered   05/28/23 2109  CONSULT TO CALL ACT TEAM       Ordering Provider: Maia Plan, MD  Provider:  (Not yet assigned)  Question:  Reason for Consult?  Answer:  Psych consult   05/28/23 2109             Mode of Visit: In person    Psychiatry Consult Evaluation  Service Date: May 30, 2023 LOS:  LOS: 0 days  Chief Complaint "noncompliant"  Primary Psychiatric Diagnoses  Medically Noncompliant 2.   Depression  Assessment  Rose Bridges is a 63 y.o. female admitted: Presented to the ED for 05/28/2023 12:50 PM for medication non compliance and aggression . She carries the psychiatric diagnoses of alcohol dependence, depression, anxiety and altered mental status and has a past medical history of epilepsy, osteogenesis imperfecta, pelvic fracture.    Her current presentation of saying " I don't care" to answer most questions and refusing medications is most consistent with medical noncompliance. Current outpatient psychotropic medications include cymbalta and ativan prn and historically she has had a moderate response to these medications. She was not compliant with medications prior to admission as evidenced by nursing home report that she refuses to take medication. On initial examination, patient is lying in bed, not wanting to be assessed. Please see Bridges below for detailed recommendations.  Diagnoses:  Active Hospital problems: Principal Problem:   Medically noncompliant Active Problems:   Major depressive disorder    Bridges   ## Psychiatric Medication Recommendations:  Start patient on Prozac 25 mg daily for depression Start patient on Seroquel 25 mg at night for mood Discontinue patient on Cymbalta 30 mg p.o. every day for  ## Medical Decision Making Capacity:  Patient is in  her own legal guardian, Rose Bridges is her power of attorney.  ## Further Work-up:  -- No further workup needed at this time EKG or U/A -- most recent EKG on 05/28/23 had QtC of 399 -- Pertinent labwork reviewed earlier this admission includes: CMP, BNP, EKG, UDS   ## Disposition:-- We recommend inpatient psychiatric hospitalization. Patient has been involuntarily committed on 05/28/23.   ## Behavioral / Environmental: -To minimize splitting of staff, assign one staff person to communicate all information from the team when feasible. or Utilize compassion and acknowledge the patient's experiences while setting clear and realistic expectations for care.    ## Safety and Observation Level:  - Based on my clinical evaluation, I estimate the patient to be at low risk of self harm in the current setting. - At this time, we recommend  routine. This decision is based on my review of the chart including patient's history and current presentation, interview of the patient, mental status examination, and consideration of suicide risk including evaluating suicidal ideation, Bridges, intent, suicidal or self-harm behaviors, risk factors, and protective factors. This judgment is based on our ability to directly address suicide risk, implement suicide prevention strategies, and develop a safety Bridges while the patient is in the clinical setting. Please contact our team if there is a concern that risk level has changed.  CSSR Risk Category:C-SSRS RISK CATEGORY: No Risk  Suicide Risk Assessment: Patient has following modifiable risk factors for suicide: under treated depression  and medication noncompliance, which we are addressing by recommending inpatient psychiatric  admission. Patient has following non-modifiable or demographic risk factors for suicide: separation or divorce Patient has the following protective factors against suicide: Supportive family  Thank you for this consult request. Recommendations  have been communicated to the primary team.  We will recommend inpatient psychiatric hospitalization and continue to follow patient at this time.   Rose Bridges, PMHNP       History of Present Illness  Relevant Aspects of Hospital ED Course:  Admitted on 05/28/2023 for medication non compliance and a  Patient Report:  Rose Bridges, 63 y.o., female patient seen face to face by this provider, consulted with Dr. Enedina Bridges; and chart reviewed on 05/30/23.  On evaluation Rose Bridges reports she does not care about being here (in the hospital) and does not care about taking any medications. Patient does not feel that she has a psychiatric diagnosis. Patient mood is dysphoric, she appears to not care about anything, or want to do anything. She says she doesn't want to take any medication because it "doesn't matter". Patient continues to refuse all her medications, she did take her last night dose of Keppra 500 mg.     During evaluation Rose Bridges is laying in bed in no acute distress.  She is alert, oriented x 3, partially cooperative.  Her mood is depressed with congruent affect.  She has normal speech, and behavior.  Objectively there is no evidence of psychosis/mania or delusional thinking.  Patient is able to converse coherently, goal directed thoughts, no distractibility, or pre-occupation. She denies suicidal/self-harm/homicidal ideation, psychosis, and paranoia. No aggression noted while patient has been in the hospital.  Discontinue patient Cymbalta, started patient on Prozac due to a longer extended half-life for depression, also started patient on a low dose antipsychotic to assist with her mood. Patient did not eat breakfast or lunch today.   Psych ROS:  Depression: Positive Anxiety: Patient denies Mania (lifetime and current): Denies Psychosis: (lifetime and current): Denies  Collateral information:  Education officer, environmental and spoke with patient's registered nurse  Rose Bridges she states that patient noncompliant with medications, especially dependence and medications dealing with her mental illness, states patient will not take her medications due to feeling that she does not need it.  She states that patient has been with them since December 2023 and states that patient has not been compliant with medications for a couple months.  She says that when patient was sent to the hospital in January 2025 due to a UTI, which she came back to the facility her noncompliance of medication became worse.  Spoke with Gerre Pebbles, one of the administrators at [facility], he states because they have recommended for patient to go to an inpatient facility, they are not willing to take patient back at the facility at this time.  He states that patient has become difficult to work with and uncooperative.  He states that once patient has been discharged from an inpatient psychiatric facility and compliant with medications, then the facility will be able to have her return.  He feels that [facility] is not equipped to handle patient when she is not on her medications due to her aggressive behavior.  Discussed with him barriers that we are facing in order for patient to be admitted to an inpatient facility, discussed with him that she has been denied to several facilities due to her high medical acuity and not being able to complete her ADLs.  Also discussed with him the medication changes that are made here in the  ED, started patient on Prozac and Seroquel.   Review of Systems  Psychiatric/Behavioral:  Positive for depression.      Psychiatric and Social History  Psychiatric History:  Information collected from patient and Hailley Byers   Prev Dx/Sx: alcohol dependence, depression, anxiety and altered mental status Current Psych Provider: none Home Meds (current): cymbalta Previous Med Trials: unknown Therapy: none   Prior Psych Hospitalization: none  Prior Self Harm: none Prior  Violence: none   Family Psych History: Sister - bipolar Family Hx suicide: none   Social History:  Developmental Hx: unknown Educational Hx: unknown Occupational Hx: not employed Armed forces operational officer Hx: none Living Situation: SNF Spiritual Hx: none Access to weapons/lethal means: none    Substance History Alcohol: HX of alcohol abuse - not currently drinking  Tobacco: none Illicit drugs: none Prescription drug abuse: none Rehab hx: none  Exam Findings  Physical Exam:  Vital Signs:  Temp:  [98.1 F (36.7 C)-98.3 F (36.8 C)] 98.3 F (36.8 C) (01/30 0655) Pulse Rate:  [83-100] 83 (01/30 0655) Resp:  [18] 18 (01/30 0655) BP: (128-137)/(74-84) 128/74 (01/30 0655) SpO2:  [97 %] 97 % (01/29 2111) Blood pressure 128/74, pulse 83, temperature 98.3 F (36.8 C), temperature source Oral, resp. rate 18, height 5\' 4"  (1.626 m), weight 54 kg, SpO2 97%. Body mass index is 20.43 kg/m.  Physical Exam Vitals and nursing note reviewed. Exam conducted with a chaperone present.  Neurological:     Mental Status: She is alert.  Psychiatric:        Attention and Perception: Attention normal.        Mood and Affect: Mood is depressed.        Speech: Speech normal.        Behavior: Behavior is cooperative.        Thought Content: Thought content normal.        Judgment: Judgment is inappropriate.     Mental Status Exam: General Appearance: Casual  Orientation:  Full (Time, Place, and Person)  Memory:  Immediate;   Good Remote;   Good  Concentration:  Concentration: Fair and Attention Span: Fair  Recall:  Fair  Attention  Fair  Eye Contact:  Minimal  Speech:  Clear and Coherent  Language:  Fair  Volume:  Decreased  Mood: depressed  Affect:  Congruent  Thought Process:  Coherent  Thought Content:  WDL and Logical  Suicidal Thoughts:  No  Homicidal Thoughts:  No  Judgement:  Impaired  Insight:  Shallow  Psychomotor Activity:  Non ambulatory  Akathisia:  Negative  Fund of Knowledge:  Fair       Assets:  Manufacturing systems engineer Social Support  Cognition:  WNL  ADL's:  Impaired  AIMS (if indicated):        Other History   These have been pulled in through the EMR, reviewed, and updated if appropriate.  Family History:  The patient's family history includes Heart attack in her mother; Hypertension in her mother; Lung cancer in her father; Stroke in her brother, mother, and sister.  Medical History: 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(HCC)  Metabolic encephalopathy    Osteogenesis imperfecta    Osteogenesis imperfecta    Osteoporosis    Personality disorder (HCC)    PVD (peripheral vascular disease) (HCC)    Stroke [facility])     Surgical History: Past Surgical History:  Procedure Laterality Date   ABDOMINAL HYSTERECTOMY     BIOPSY  07/05/2022   Procedure: BIOPSY;  Surgeon: Lemar Lofty., MD;  Location: [facility];  Service: Gastroenterology;;   ESOPHAGOGASTRODUODENOSCOPY N/A 07/05/2022   Procedure: ESOPHAGOGASTRODUODENOSCOPY (EGD);  Surgeon: Lemar Lofty., MD;  Location: [facility];  Service: Gastroenterology;  Laterality: N/A;   EUS N/A 07/05/2022   Procedure: UPPER ENDOSCOPIC ULTRASOUND (EUS) LINEAR;  Surgeon: Lemar Lofty., MD;  Location: [facility];  Service: Gastroenterology;  Laterality: N/A;   HIP ARTHROPLASTY Left 09/26/2015   Procedure: ARTHROPLASTY BIPOLAR HIP (HEMIARTHROPLASTY);  Surgeon: Sheral Apley, MD;  Location: [facility] OR;  Service: Orthopedics;  Laterality: Left;   ORIF HUMERUS FRACTURE Right 11/06/2016   Procedure: OPEN REDUCTION INTERNAL FIXATION (ORIF) PROXIMAL HUMERUS  FRACTURE;  Surgeon: Sheral Apley, MD;  Location: [facility] OR;  Service: Orthopedics;  Laterality: Right;     Medications:   Current Facility-Administered Medications:    aspirin chewable tablet 81 mg, 81 mg, Oral, Daily, Long, Arlyss Repress, MD   DULoxetine (CYMBALTA) DR capsule 30 mg, 30 mg, Oral, Daily, Long, Arlyss Repress, MD   levETIRAcetam (KEPPRA) tablet 500 mg, 500 mg, Oral, BID, Long, Arlyss Repress, MD, 500 mg at 05/29/23 2123  Current Outpatient Medications:    acetaminophen (TYLENOL) 500 MG tablet, Take 1,000 mg by mouth 3 (three) times daily., Disp: , Rfl:    aspirin 81 MG chewable tablet, Chew 81 mg by mouth daily., Disp: , Rfl:    BIOFREEZE COOL THE PAIN 4 % GEL, Apply 1 application  topically See admin instructions. Apply to both hands and knees 2 times a day, Disp: , Rfl:    bisacodyl (DULCOLAX) 10 MG suppository, Place 10 mg rectally daily as needed (for constipation)., Disp: , Rfl:    calcitonin, salmon, (MIACALCIN/FORTICAL) 200 UNIT/ACT nasal spray, Place 1 spray into alternate nostrils daily. (Patient taking differently: 1 spray See admin instructions. Instill 1 spray into the right nostril once a day- per [facility]), Disp: 3.7 mL, Rfl: 12   calcium carbonate (TUMS - DOSED IN MG ELEMENTAL CALCIUM) 500 MG chewable tablet, Chew 1 tablet by mouth 2 (two) times daily., Disp: , Rfl:    celecoxib (CELEBREX) 50 MG capsule, Take 50 mg by mouth daily., Disp: , Rfl:    DULoxetine (CYMBALTA) 30 MG capsule, Take 1 capsule (30 mg total) by mouth daily., Disp: 30 capsule, Rfl: 0   FIBER PO, Take 0.4 g by mouth 3 (three) times daily between meals., Disp: , Rfl:    levETIRAcetam (KEPPRA) 500 MG tablet, Take 1 tablet (500 mg total) by mouth 2 (two) times daily., Disp: , Rfl:    Lidocaine 3 % CREA, Apply 1 application  topically See admin instructions. Apply to both knees 3 times a day, Disp: , Rfl:    LORazepam (ATIVAN) 0.5 MG tablet, Take 0.5 mg by mouth 2 (two) times daily as needed for anxiety., Disp: , Rfl:     magnesium oxide (MAG-OX) 400 (240 Mg) MG tablet, Take 400 mg by mouth daily., Disp: , Rfl:    Menthol-Methyl Salicylate (MUSCLE RUB) 10-15 % CREA, Apply 1 Application topically 2 (two) times daily as needed for muscle pain., Disp: , Rfl:    oxyCODONE (OXY  IR/ROXICODONE) 5 MG immediate release tablet, Take 5 mg by mouth every 6 (six) hours as needed for moderate pain (pain score 4-6) or severe pain (pain score 7-10)., Disp: , Rfl:    pantoprazole (PROTONIX) 40 MG tablet, Take 40 mg by mouth daily., Disp: , Rfl:    polyethylene glycol (MIRALAX / GLYCOLAX) 17 g packet, Take 17 g by mouth at bedtime as needed for mild constipation (mix into 6 ounces of fluid)., Disp: , Rfl:    pregabalin (LYRICA) 100 MG capsule, Take 100 mg by mouth 3 (three) times daily., Disp: , Rfl:    senna (SENOKOT) 8.6 MG TABS tablet, Take 1 tablet by mouth daily as needed for mild constipation., Disp: , Rfl:    Vitamin D, Ergocalciferol, (DRISDOL) 1.25 MG (50000 UNIT) CAPS capsule, Take 1 capsule (50,000 Units total) by mouth every 7 (seven) days. (Patient taking differently: Take 50,000 Units by mouth every Thursday.), Disp: 5 capsule, Rfl:    cephALEXin (KEFLEX) 500 MG capsule, Take 1 capsule (500 mg total) by mouth 3 (three) times daily. (Patient not taking: Reported on 05/28/2023), Disp: 20 capsule, Rfl: 0   gabapentin (NEURONTIN) 300 MG capsule, Take 1 capsule (300 mg total) by mouth 3 (three) times daily. (Patient not taking: Reported on 05/17/2023), Disp: 10 capsule, Rfl: 0  Allergies: Allergies  Allergen Reactions   Codeine Other (See Comments)    Migraine     Brendy Ficek MOTLEY-MANGRUM, PMHNP

## 2023-05-30 NOTE — ED Notes (Signed)
Belongings placed in locker #30.

## 2023-05-31 DIAGNOSIS — B338 Other specified viral diseases: Secondary | ICD-10-CM

## 2023-05-31 DIAGNOSIS — R404 Transient alteration of awareness: Secondary | ICD-10-CM | POA: Diagnosis not present

## 2023-05-31 DIAGNOSIS — F329 Major depressive disorder, single episode, unspecified: Secondary | ICD-10-CM | POA: Diagnosis not present

## 2023-05-31 NOTE — Consult Note (Signed)
[facility] Consult Follow-up  Patient Name: .Rose Bridges  MRN: [id number]  DOB: Jul 05, 1960  Consult Order details:  Orders (From admission, onward)     Start     Ordered   05/28/23 2109  CONSULT TO CALL ACT TEAM       Ordering Provider: Maia Plan, MD  Provider:  (Not yet assigned)  Question:  Reason for Consult?  Answer:  Psych consult   05/28/23 2109             Mode of Visit: In person    Psychiatry Consult Evaluation  Service Date: May 31, 2023 LOS:  LOS: 0 days  Chief Complaint "noncompliant"  Primary Psychiatric Diagnoses  Medically Noncompliant 2.   Depression  Assessment  Rose Bridges is a 63 y.o. female admitted: Presented to the ED for 05/28/2023 12:50 PM for medication non compliance and aggression . She carries the psychiatric diagnoses of alcohol dependence, depression, anxiety and altered mental status and has a past medical history of epilepsy, osteogenesis imperfecta, pelvic fracture.    Her current presentation of saying " I don't care" to answer most questions and refusing medications is most consistent with medical noncompliance. Current outpatient psychotropic medications include cymbalta and ativan prn and historically she has had a moderate response to these medications. She was not compliant with medications prior to admission as evidenced by nursing home report that she refuses to take medication. On initial examination, patient is lying in bed, not wanting to be assessed. Please see plan below for detailed recommendations.Her daughter in law is at the bedside, she does provide consent by requesting her DIL remain in the room during the evaluation.   Diagnoses:  Active Hospital problems: Principal Problem:   Medically noncompliant Active Problems:   Major depressive disorder    Plan   ## Psychiatric Medication Recommendations:  C patient on Prozac 25 mg daily for depression C patient on Seroquel 25 mg at night for  mood Discontinue patient on Cymbalta 30 mg p.o. every day for  ## Medical Decision Making Capacity:  Patient is in her own legal guardian, Quaniya Damas is her power of attorney.  ## Further Work-up:  -- No further workup needed at this time EKG or U/A -- most recent EKG on 05/28/23 had QtC of 399 -- Pertinent labwork reviewed earlier this admission includes: CMP, BNP, EKG, UDS   ## Disposition:-- We recommend inpatient psychiatric hospitalization. Patient has been involuntarily committed on 05/28/23.   ## Behavioral / Environmental: -To minimize splitting of staff, assign one staff person to communicate all information from the team when feasible. or Utilize compassion and acknowledge the patient's experiences while setting clear and realistic expectations for care.    ## Safety and Observation Level:  - Based on my clinical evaluation, I estimate the patient to be at low risk of self harm in the current setting. - At this time, we recommend  routine. This decision is based on my review of the chart including patient's history and current presentation, interview of the patient, mental status examination, and consideration of suicide risk including evaluating suicidal ideation, plan, intent, suicidal or self-harm behaviors, risk factors, and protective factors. This judgment is based on our ability to directly address suicide risk, implement suicide prevention strategies, and develop a safety plan while the patient is in the clinical setting. Please contact our team if there is a concern that risk level has changed.  CSSR Risk Category:C-SSRS RISK CATEGORY: No Risk  Suicide Risk Assessment:  Patient has following modifiable risk factors for suicide: under treated depression  and medication noncompliance, which we are addressing by recommending inpatient psychiatric admission. Patient has following non-modifiable or demographic risk factors for suicide: separation or divorce Patient has the  following protective factors against suicide: Supportive family  Thank you for this consult request. Recommendations have been communicated to the primary team.  We will recommend inpatient psychiatric hospitalization and continue to follow patient at this time.   Maryagnes Amos, FNP       History of Present Illness  Relevant Aspects of Hospital ED Course:  Admitted on 05/28/2023 for medication non compliance and a  Patient Report:  Rose Bridges, 63 y.o., female patient seen face to face by this provider, consulted with Dr. Enedina Finner; and chart reviewed on 05/30/23.  On evaluation Zara Wendt reports she does not care about being here (in the hospital) and does not care about taking any medications. Patient does not feel that she has a psychiatric diagnosis. Patient mood is dysphoric, she appears to not care about anything, or want to do anything. She says she doesn't want to take any medication because it "doesn't matter". Patient continues to refuse all her medications, she did take her last night dose of Keppra 500 mg.     During the evaluation, the patient was observed lying in bed and appeared to be in no acute distress. She was alert and oriented to person, place, and time (x3), though partially cooperative. Her mood was noted to be depressed, with a blunt and labile affect. Her speech and behavior were within normal limits. There was no objective evidence of psychosis, mania, or delusional thinking. The patient was able to converse coherently, with goal-directed thoughts, and showed no signs of distractibility or preoccupation.  The patient denied suicidal or self-harm thoughts, as well as homicidal ideation, psychosis, and paranoia. No aggression has been noted during her hospitalization up to this point. However, she did not eat breakfast or lunch on the day of this evaluation.  During the evaluation, the patient called the mental health technicians into her room and directed  inappropriate language, cursing at them and telling them to leave. She displayed rude and hostile behavior at times during the assessment and refused to disclose her name.  While the patient demonstrated some cooperative behavior, her hostility and refusal to engage appropriately with staff raise concerns. Her depressive symptoms, along with her interpersonal difficulties, will need to be monitored closely   Psych ROS:  Depression: Positive Anxiety: Patient denies Mania (lifetime and current): Denies Psychosis: (lifetime and current): Denies  Collateral information:  Spoke with [place] daughter in law. She reports patient is displaying some new behaviors and mood. She states generally she is "mean but has never been this hateful. I have never seen her refuse to answer questions or not tell you her name." She has become more delusional and stating things have happened that have not happened such as being kidnapped. She states she has known patient for over 21 years and she has always been a poor/picky eater. She has only ate a couple bites of food, which would consist of a peanut butter sandwich etc. She reports this happened before when she was admitted with a UTI, but now her UTI is negative. DIL was informed patients urinalysis and Urine Culture are reflective of bacteria in her urine, and could explain excerebration of symptoms at this time.   Review of Systems  Psychiatric/Behavioral:  Positive for depression.  Psychiatric and Social History  Psychiatric History:  Information collected from patient and Tayia Stonesifer   Prev Dx/Sx: alcohol dependence, depression, anxiety and altered mental status Current Psych Provider: none Home Meds (current): cymbalta Previous Med Trials: unknown Therapy: none   Prior Psych Hospitalization: none  Prior Self Harm: none Prior Violence: none   Family Psych History: Sister - bipolar Family Hx suicide: none   Social History:  Developmental Hx:  unknown Educational Hx: unknown Occupational Hx: not employed Armed forces operational officer Hx: none Living Situation: SNF Spiritual Hx: none Access to weapons/lethal means: none    Substance History Alcohol: HX of alcohol abuse - not currently drinking  Tobacco: none Illicit drugs: none Prescription drug abuse: none Rehab hx: none  Exam Findings  Physical Exam:  Vital Signs:  Temp:  [97.6 F (36.4 C)-98.5 F (36.9 C)] 97.6 F (36.4 C) (01/31 1418) Pulse Rate:  [87-106] 106 (01/31 1418) Resp:  [15-18] 18 (01/31 1418) BP: (108-125)/(74-88) 108/74 (01/31 1418) SpO2:  [95 %-99 %] 96 % (01/31 1418) Blood pressure 108/74, pulse (!) 106, temperature 97.6 F (36.4 C), temperature source Axillary, resp. rate 18, height 5\' 4"  (1.626 m), weight 54 kg, SpO2 96%. Body mass index is 20.43 kg/m.  Physical Exam Vitals and nursing note reviewed. Exam conducted with a chaperone present.  Neurological:     Mental Status: She is alert.  Psychiatric:        Attention and Perception: Attention normal.        Mood and Affect: Mood is depressed.        Speech: Speech normal.        Behavior: Behavior is cooperative.        Thought Content: Thought content normal.        Judgment: Judgment is inappropriate.     Mental Status Exam: General Appearance: Casual  Orientation:  Full (Time, Place, and Person)  Memory:  Immediate;   Fair Remote;   Fair  Concentration:  Concentration: Fair and Attention Span: Fair  Recall:  Fair  Attention  Fair  Eye Contact:  Minimal  Speech:  Clear and Coherent  Language:  Fair  Volume:  Decreased  Mood: depressed  Affect:  Congruent  Thought Process:  Coherent  Thought Content:  WDL and Logical  Suicidal Thoughts:  No  Homicidal Thoughts:  No  Judgement:  Impaired  Insight:  Shallow  Psychomotor Activity:  Non ambulatory  Akathisia:  Negative  Fund of Knowledge:  Fair      Assets:  Manufacturing systems engineer Social Support  Cognition:  WNL  ADL's:  Impaired  AIMS (if  indicated):        Other History   These have been pulled in through the EMR, reviewed, and updated if appropriate.  Family History:  The patient's family history includes Heart attack in her mother; Hypertension in her mother; Lung cancer in her father; Stroke in her brother, mother, and sister.  Medical History: Past Medical History:  Diagnosis Date  . Agoraphobia   . Arthritis   . Bipolar disorder (HCC)   . Degeneration of lumbar intervertebral disc   . Disseminated intravascular coagulation (HCC)   . Epilepsy (HCC)   . Fracture of unspecified parts of lumbosacral spine and pelvis, sequela   . History of falling   . Hyperlipemia   . Hypertension   . 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(HCC)   . Metabolic encephalopathy   . Osteogenesis imperfecta   . Osteogenesis imperfecta   . Osteoporosis   .  Personality disorder (HCC)   . PVD (peripheral vascular disease) (HCC)   . Stroke [facility])     Surgical History: Past Surgical History:  Procedure Laterality Date  . ABDOMINAL HYSTERECTOMY    . BIOPSY  07/05/2022   Procedure: BIOPSY;  Surgeon: Lemar Lofty., MD;  Location: [facility];  Service: Gastroenterology;;  . ESOPHAGOGASTRODUODENOSCOPY N/A 07/05/2022   Procedure: ESOPHAGOGASTRODUODENOSCOPY (EGD);  Surgeon: Lemar Lofty., MD;  Location: [facility];  Service: Gastroenterology;  Laterality: N/A;  . EUS N/A 07/05/2022   Procedure: UPPER ENDOSCOPIC ULTRASOUND (EUS) LINEAR;  Surgeon: Lemar Lofty., MD;  Location: [facility];  Service: Gastroenterology;  Laterality: N/A;  . HIP ARTHROPLASTY Left 09/26/2015   Procedure: ARTHROPLASTY BIPOLAR HIP (HEMIARTHROPLASTY);  Surgeon: Sheral Apley, MD;  Location: [facility] OR;  Service: Orthopedics;  Laterality: Left;  . ORIF HUMERUS FRACTURE Right 11/06/2016   Procedure: OPEN REDUCTION INTERNAL FIXATION (ORIF) PROXIMAL HUMERUS FRACTURE;  Surgeon: Sheral Apley, MD;  Location: [facility] OR;  Service: Orthopedics;   Laterality: Right;     Medications:   Current Facility-Administered Medications:  .  aspirin chewable tablet 81 mg, 81 mg, Oral, Daily, Long, Arlyss Repress, MD, 81 mg at 05/31/23 0950 .  DULoxetine (CYMBALTA) DR capsule 30 mg, 30 mg, Oral, Daily, Long, Arlyss Repress, MD, 30 mg at 05/31/23 0950 .  levETIRAcetam (KEPPRA) tablet 500 mg, 500 mg, Oral, BID, Long, Arlyss Repress, MD, 500 mg at 05/31/23 1610  Current Outpatient Medications:  .  acetaminophen (TYLENOL) 500 MG tablet, Take 1,000 mg by mouth 3 (three) times daily., Disp: , Rfl:  .  aspirin 81 MG chewable tablet, Chew 81 mg by mouth daily., Disp: , Rfl:  .  BIOFREEZE COOL THE PAIN 4 % GEL, Apply 1 application  topically See admin instructions. Apply to both hands and knees 2 times a day, Disp: , Rfl:  .  bisacodyl (DULCOLAX) 10 MG suppository, Place 10 mg rectally daily as needed (for constipation)., Disp: , Rfl:  .  calcitonin, salmon, (MIACALCIN/FORTICAL) 200 UNIT/ACT nasal spray, Place 1 spray into alternate nostrils daily. (Patient taking differently: 1 spray See admin instructions. Instill 1 spray into the right nostril once a day- per [facility]), Disp: 3.7 mL, Rfl: 12 .  calcium carbonate (TUMS - DOSED IN MG ELEMENTAL CALCIUM) 500 MG chewable tablet, Chew 1 tablet by mouth 2 (two) times daily., Disp: , Rfl:  .  celecoxib (CELEBREX) 50 MG capsule, Take 50 mg by mouth daily., Disp: , Rfl:  .  DULoxetine (CYMBALTA) 30 MG capsule, Take 1 capsule (30 mg total) by mouth daily., Disp: 30 capsule, Rfl: 0 .  FIBER PO, Take 0.4 g by mouth 3 (three) times daily between meals., Disp: , Rfl:  .  levETIRAcetam (KEPPRA) 500 MG tablet, Take 1 tablet (500 mg total) by mouth 2 (two) times daily., Disp: , Rfl:  .  Lidocaine 3 % CREA, Apply 1 application  topically See admin instructions. Apply to both knees 3 times a day, Disp: , Rfl:  .  LORazepam (ATIVAN) 0.5 MG tablet, Take 0.5 mg by mouth 2 (two) times daily as needed for anxiety., Disp: , Rfl:  .  magnesium oxide  (MAG-OX) 400 (240 Mg) MG tablet, Take 400 mg by mouth daily., Disp: , Rfl:  .  Menthol-Methyl Salicylate (MUSCLE RUB) 10-15 % CREA, Apply 1 Application topically 2 (two) times daily as needed for muscle pain., Disp: , Rfl:  .  oxyCODONE (OXY IR/ROXICODONE) 5 MG immediate release tablet, Take 5 mg  by mouth every 6 (six) hours as needed for moderate pain (pain score 4-6) or severe pain (pain score 7-10)., Disp: , Rfl:  .  pantoprazole (PROTONIX) 40 MG tablet, Take 40 mg by mouth daily., Disp: , Rfl:  .  polyethylene glycol (MIRALAX / GLYCOLAX) 17 g packet, Take 17 g by mouth at bedtime as needed for mild constipation (mix into 6 ounces of fluid)., Disp: , Rfl:  .  pregabalin (LYRICA) 100 MG capsule, Take 100 mg by mouth 3 (three) times daily., Disp: , Rfl:  .  senna (SENOKOT) 8.6 MG TABS tablet, Take 1 tablet by mouth daily as needed for mild constipation., Disp: , Rfl:  .  Vitamin D, Ergocalciferol, (DRISDOL) 1.25 MG (50000 UNIT) CAPS capsule, Take 1 capsule (50,000 Units total) by mouth every 7 (seven) days. (Patient taking differently: Take 50,000 Units by mouth every Thursday.), Disp: 5 capsule, Rfl:  .  cephALEXin (KEFLEX) 500 MG capsule, Take 1 capsule (500 mg total) by mouth 3 (three) times daily. (Patient not taking: Reported on 05/28/2023), Disp: 20 capsule, Rfl: 0 .  gabapentin (NEURONTIN) 300 MG capsule, Take 1 capsule (300 mg total) by mouth 3 (three) times daily. (Patient not taking: Reported on 05/17/2023), Disp: 10 capsule, Rfl: 0  Allergies: Allergies  Allergen Reactions  . Codeine Other (See Comments)    Migraine     Maryagnes Amos, FNP

## 2023-05-31 NOTE — Progress Notes (Signed)
LCSW Progress Note  [id number]   Rose Bridges  05/31/2023  2:08 PM  Description:   Inpatient Psychiatric Referral  Patient was recommended inpatient per [facility], PMHNP  There are no available beds at [facility], per [facility] RN Patient was referred to the following out of network facilities:    Destination  Service Provider Address Phone Fax  [facility] Kathryn [address] [place], [place] [address] [phone]  [facility] [address] [place] [address] [phone]  [facility] [address], [place] [address] [phone]  [facility] [address] [place], [place] [address] [phone]  [facility] [address] [place] [address] [phone]  [facility] [address] [place] [address] [phone]  [facility] [address] [place], [place] [address] [phone]  [facility] [address] [place] [address] [phone]  [facility] [address] [place] [address] [phone]  [facility] [address], [place] [address] [phone]  [facility] [address], [place] [address] [phone]  [facility] [address], [place] [address] [phone]  [facility] [address] [place] [address] [phone]  [facility] [address] [place] [address] [phone]  [facility] [address] [place] [address] [phone]  [facility] EFAX  [address] [place] [phone]  [facility] [address], [place] [address] [phone]  [facility] [address], [place] [address] [phone]  [facility] [address] [place] [address] [phone]  [facility] [address], [place] [address] [phone]  [facility] [address] [place] [address] [phone]  [facility] [address], [place] [address] [phone]  [facility] [place] [phone]  [facility] [address], [place] [address] [phone]  [facility] [address] [place] [address] [phone]  [facility], [place] [phone]  [facility] [address] [place] [address] [phone]  [facility] [address] [place] [address] [phone]  [facility] [place] [address] [phone]      Situation ongoing, CSW to continue following and update chart as more information becomes available.     [name], MSW, LCSW  05/31/2023 2:08 PM

## 2023-05-31 NOTE — ED Provider Notes (Signed)
Emergency Medicine Observation Re-evaluation Note  Rose Bridges is a 63 y.o. female, seen on rounds today.  Pt initially presented to the ED for complaints of IVC and Altered Mental Status Currently, the patient is resting.  Physical Exam  BP 125/88 (BP Location: Left Arm)   Pulse 97   Temp 98.5 F (36.9 C) (Axillary)   Resp 18   Ht 1.626 m (5\' 4" )   Wt 54 kg   SpO2 95%   BMI 20.43 kg/m  Physical Exam General: nad Cardiac: regular rate Lungs: normal effort Psych:   ED Course / MDM  EKG:EKG Interpretation Date/Time:  Tuesday May 28 2023 13:35:00 EST Ventricular Rate:  139 PR Interval:  136 QRS Duration:  83 QT Interval:  262 QTC Calculation: 399 R Axis:   83  Text Interpretation: Sinus tachycardia Borderline right axis deviation Low voltage, precordial leads Abnormal T, consider ischemia, diffuse leads Artifact in lead(s) I II III aVR aVL aVF V5 Similar to Dec 8th tracing Confirmed by [name] [phone]) on 05/28/2023 4:06:47 PM  I have reviewed the labs performed to date as well as medications administered while in observation.  Recent changes in the last 24 hours include no acute events.  Plan  Current plan is for inpatient psych treatment.    Linwood Dibbles, MD 05/31/23 1102

## 2023-06-01 DIAGNOSIS — F329 Major depressive disorder, single episode, unspecified: Secondary | ICD-10-CM | POA: Diagnosis not present

## 2023-06-01 LAB — RESP PANEL BY RT-PCR (RSV, FLU A&B, COVID)  RVPGX2
Influenza A by PCR: NEGATIVE
Influenza B by PCR: NEGATIVE
Resp Syncytial Virus by PCR: POSITIVE — AB
SARS Coronavirus 2 by RT PCR: NEGATIVE

## 2023-06-01 NOTE — ED Notes (Signed)
Patient reports she is in pain in back area . Writer offers medication and patient states she does not want to take medication because it is a waste. Patient readjusted in bed with assistance of sitter staff.

## 2023-06-01 NOTE — ED Notes (Signed)
Pt refused dinner tray

## 2023-06-01 NOTE — ED Notes (Signed)
Patient reports she isn't hungry and declines breakfast. Says she rarely eats at baseline. Patient took medications whole and without difficulty.

## 2023-06-01 NOTE — ED Notes (Signed)
Patient noted to have cough and states she is freezing. Warm blankets provided. Vital signs updated. Patient says her daughter in law and grand daughter actively have covid. Writer notified provider and requested respiratory panel order.

## 2023-06-01 NOTE — Progress Notes (Signed)
LCSW Progress Note  [id number]   Rose Bridges  06/01/2023  12:15 AM    Inpatient Behavioral Health Placement  Pt meets inpatient criteria per Maryagnes Amos, FNP. There are no available beds within [facility] system per [facility] Brook McNichol,RN. Referral was sent to the following facilities;   Destination  Service Provider Address Phone [facility] [address] [place], [place] [address] [phone]  [facility] [address] [place] [address] [phone]  [facility] [address], [place] [address] [phone]  [facility] [address] [place], [place] [address] [phone]  [facility] [address], [place] [address] [phone]  [facility] Center-Adult [address] [place] [address] [phone]  [facility] [address] [place], [place] [address] [phone]  [facility] [address] [place] [address] [phone]  [facility] [address] [place] [address] [phone]  [facility] [address] [place] [address] [phone]  [facility] [address] [place] [address] [phone]  [facility] [address], [place] [address] [phone]  [facility] [address], [place] [address] [phone]  [facility] [address] [place] [address] [phone]  [facility] [address] [place] [address] [phone]  [facility] EFAX [address]  Karolee Ohs [place] [phone]  [facility] [address], [place] [address] [phone]  [facility] [address] [place] [address] [phone]  [facility] [address], [place] [address] [phone]  [facility] [address], [place] [address] [phone]  [facility] [address] [place] [address] [phone]  [facility] [address] [place] [address] [phone]  [facility] Inpatient [facility] [place] [phone]  [facility] [address] [place] [address] [phone]  [facility] [address], [place] [address] [phone]  [facility] Health Patient Placement [facility], [place] [phone]  [facility] [address] [place] [address] [phone]  [facility] [address] [place] [address] [phone]  [facility] Health 1 medical [place] [address] [phone]    Situation ongoing,  CSW will follow up.    Maryjean Ka, MSW, LCSWA 06/01/2023 12:15 AM

## 2023-06-01 NOTE — Progress Notes (Signed)
Patient has been [facility] by [facility] due to no appropriate beds available. Patient meets BH inpatient criteria per Caryn Bee, NP. Patient has been faxed out to the following facilities:   [facility] [address] [place], [place] [address] [phone]  [facility] [address] [place] [address] [phone]  [facility] [address], [place] [address] [phone]  [facility] [address] [place], [place] [address] [phone]  [facility] [address], [place] [address] [phone]  [facility] Center-Adult [address] [place] [address] [phone]  [facility] [address] [place], [place] [address] [phone]  [facility] [address] [place] [address] [phone]  [facility] [address] [place] [address] [phone]  [facility] [address] [place] [address] [phone]  [facility] [address] [place] [address] [phone]  [facility] [address] [place] [address] [phone]  [facility] [address], [place] [address] [phone]  [facility] [address] [place] [address] [phone]  [facility] [address] [place] [address] [phone]  [facility] EFAX [address] Karolee Ohs [place] [phone]  [facility] [address] [place] [address] [phone]  [facility] [address], [place] [address] [phone]  [facility] [address], [place] [address] [phone]  [facility] [address] [place], [place] [address] [phone]  [facility] [address] [place], [place] [address] [phone]  [facility] [address], [place] [address] [phone]  [facility] Psychiatry Inpatient [name] [place] [phone]  [facility] [address] [place] [address] [phone]  [facility] [address], [place] [address] [phone]  [facility] Health Patient Placement [facility], [place] [phone]  [facility] [address] [place] [address] [phone]  [facility] [address] [place] [address] [phone]  [facility] Health 1 medical [place] [address] [phone]  [facility] [address] [place] [address] [phone]  [facility] BED Management Behavioral Health [place] [phone]  [facility] [address] [place] [address] [phone]   Damita Dunnings, MSW, LCSW-A  4:48 PM 06/01/2023

## 2023-06-02 DIAGNOSIS — F329 Major depressive disorder, single episode, unspecified: Secondary | ICD-10-CM | POA: Diagnosis not present

## 2023-06-02 NOTE — ED Notes (Signed)
Pt daughter in law called about placement status - I explained placement is actively trying to find an appropriate facility.  Daughter is moving to [place] and is open to placement in that location also

## 2023-06-02 NOTE — ED Notes (Signed)
Patient resting in bed breathing watching TV chest rise and fall present

## 2023-06-02 NOTE — ED Notes (Signed)
Patient resting in bed chest rising and falling with eyes open. Nurse asked if patient needed anything and patient responded "I am ok right now thank you "

## 2023-06-02 NOTE — ED Notes (Signed)
Pt refused breakfast tray.  

## 2023-06-02 NOTE — ED Notes (Signed)
Patient resting in bed chest rising and falling with eyes open.

## 2023-06-02 NOTE — ED Notes (Signed)
Patient resting In bed breathing watching TV at this time

## 2023-06-02 NOTE — Progress Notes (Signed)
LCSW Progress Note  [id number]   Parneet Glantz  06/02/2023  4:47 PM    Inpatient Behavioral Health Placement  Pt meets inpatient criteria per Maryagnes Amos, FNP. There are no available beds [facility] per [facility] Linsey Strader,RN. Referral was sent to the following facilities;   Destination  Service Provider Address Phone [facility] [address] [place], [place] [address] [phone]  [facility] [address] [place] [address] [phone]  [facility] [address] [place] [address] [phone]  [facility] [address] [place], [place] [address] [phone]  [facility] [address] [place] [address] [phone]  [facility] Center-Adult [address] [place] [address] [phone]  [facility] [address] [place], [place] [address] [phone]  [facility] [address] [place] [place] [address] [phone]  [facility] [address] [place] [address] [phone]  [facility] [address], [place] [address] [phone]  [facility] [address] [place] [address] [phone]  [facility] [address] [place] [address] [phone]  [facility] [address] [place] [address] [phone]  [facility] [address] [place] [address] [phone]  [facility] [address] [place] [address] [phone]  [facility] EFAX [address]  Karolee Ohs [place] [phone]  [facility] [address] [place] [address] [phone]  [facility] [address] [place] [address] [phone]  [facility] [address] [place] [address] [phone]  [facility] [address] [place], [place] [address] [phone]  [facility] [address] [place], [place] [address] [phone]  [facility] [address], [place] [address] [phone]  [facility] Psychiatry Inpatient [facility] [place] [phone]  [facility] [address], [place] [address] [phone]  [facility] [address] [place] [address] [phone]  [facility] Health Patient Placement [facility], [place] [phone]  [facility] [address] [place] [address] [phone]  [facility] [address], [place] [address] [phone]  [facility] Health 1 medical [place] [address] [phone]  [facility] [address] [place] [address] [phone]  [facility] BED Management Behavioral Health [place] [phone]  [facility] [address] [place] [address] [phone]     Situation ongoing,  CSW will follow up.    Maryjean Ka, MSW, [facility] 06/02/2023 4:47 PM

## 2023-06-02 NOTE — ED Provider Notes (Signed)
Emergency Medicine Observation Re-evaluation Note  Rose Bridges is a 63 y.o. female, seen on rounds today.  Pt initially presented to the ED for complaints of IVC and Altered Mental Status Currently, the patient is resting.  Physical Exam  BP (!) 135/94 (BP Location: Left Arm)   Pulse 98   Temp 97.9 F (36.6 C) (Oral)   Resp 18   Ht 1.626 m (5\' 4" )   Wt 54 kg   SpO2 97%   BMI 20.43 kg/m  Physical Exam General: nad  ED Course / MDM  EKG:EKG Interpretation Date/Time:  Tuesday May 28 2023 13:35:00 EST Ventricular Rate:  139 PR Interval:  136 QRS Duration:  83 QT Interval:  262 QTC Calculation: 399 R Axis:   83  Text Interpretation: Sinus tachycardia Borderline right axis deviation Low voltage, precordial leads Abnormal T, consider ischemia, diffuse leads Artifact in lead(s) I II III aVR aVL aVF V5 Similar to Dec 8th tracing Confirmed by [name] [phone]) on 05/28/2023 4:06:47 PM  I have reviewed the labs performed to date as well as medications administered while in observation.  Recent changes in the last 24 hours include no acute events.  Plan  Current plan is for inpt treatment.    Linwood Dibbles, MD 06/02/23 [phone]

## 2023-06-02 NOTE — ED Notes (Signed)
Patient resting in bed breathing watching TV at this time

## 2023-06-03 DIAGNOSIS — F329 Major depressive disorder, single episode, unspecified: Secondary | ICD-10-CM | POA: Diagnosis not present

## 2023-06-03 DIAGNOSIS — R404 Transient alteration of awareness: Secondary | ICD-10-CM | POA: Diagnosis not present

## 2023-06-03 DIAGNOSIS — B338 Other specified viral diseases: Secondary | ICD-10-CM | POA: Diagnosis not present

## 2023-06-03 MED ORDER — FLUOXETINE HCL 20 MG PO CAPS
20.0000 mg | ORAL_CAPSULE | Freq: Every day | ORAL | Status: DC
Start: 2023-06-03 — End: 2023-06-05
  Administered 2023-06-03 – 2023-06-05 (×3): 20 mg via ORAL
  Filled 2023-06-03 (×3): qty 1

## 2023-06-03 MED ORDER — FLUOXETINE HCL 20 MG PO CAPS: 30.0000 mg | ORAL_CAPSULE | Freq: Every day | ORAL | Status: DC

## 2023-06-03 NOTE — ED Notes (Signed)
 Patient resting in bed breathing eyes closed

## 2023-06-03 NOTE — ED Notes (Signed)
Patient resting in bed breathing eyes closed with sitter present

## 2023-06-03 NOTE — Consult Note (Signed)
[facility] Follow-[facility]  Patient Name: .Rose Bridges  MRN: [id number]  DOB: 22-Apr-1961  Consult Order details:  Orders (From admission, onward)     Start     Ordered   05/28/23 2109  CONSULT TO CALL ACT TEAM       Ordering Provider: Maia Plan, MD  Provider:  (Not yet assigned)  Question:  Reason for Consult?  Answer:  Psych consult   05/28/23 2109             Mode of Visit: In person    Psychiatry Consult Evaluation  Service Date: June 03, 2023 LOS:  LOS: 0 days  Chief Complaint "noncompliant"  Primary Psychiatric Diagnoses  Medically Noncompliant Depression  Assessment  Rose Bridges is a 63 y.o. female admitted: Presented to the ED for medication non compliance and aggression . She carries the psychiatric diagnoses of alcohol dependence, depression, anxiety and altered mental status and has a past medical history of epilepsy, osteogenesis imperfecta, pelvic fracture.    Rose Bridges, 63 y.o., female patient seen face to face by this provider, consulted with Dr. Woodroe Mode; and chart reviewed on 06/03/23.  On evaluation Rose Bridges reports that she is ready to go home, when this provider asked her where she will be going to, she stated to the facility that she came from.  She states if she needs to come back she will come back, but feels she needs to leave the hospital.  Patient continues to be in a dysphoric mood she begins telling this provider how her mother passed away, and she is mad at her brother because he felt the need to take over her life and control her and where she lives.  She states that she is over people, she is tired of people telling her what to do and what to eat.  She says she told her brother "family do not love on me when I fall, so it seems as if family does not care."She then states as she continues to talk to provider that she does not want to go back to [facility], as she feels that it was a set up between her  brother and the owner and the owner just has to take all of her money.  She states they do not care for her well at the facility.  She states that she wants to go to [place] and live with her daughter-in-law.  She then begins saying how she got upset with her granddaughter because her granddaughter told the staff here what she likes to eat, chicken noodle soup, she begins to get irritable because she feels as if her granddaughter should not have told the staff what she likes to eat, she states is nobody business what I eat and if I want to eat.  Patient does not want to answer questions that this provider ask, she only wants to talk about her grievances with her family, facility, and [facility].   During evaluation Rose Bridges is sitting up in the bed and appears to be  in no acute distress. She is alert, oriented x  3, she will not answer orientation questions stating "oh no I am not playing that game "but then says who the current president is, and discusses his wife, and discusses the current state of the [place], and how she thinks that he is. Her mood is dyphoric with congruent affect. She has normal speech, and appropriate behavior.  Objectively there is no evidence of psychosis/mania or delusional  thinking.  Patient is able to converse coherently, goal directed thoughts, no distractibility, or pre-occupation. She denies suicidal/self-harm/homicidal ideation, psychosis, and paranoia.  This provider ask if she can come back and talk with her tomorrow, she at first has no, when provider starts leaving out of the door of her room and says ok, she states "if you want to come back tomorrow to see me you can, and if I feel like talking I will".  On initial examination, patient is lying in bed, not wanting to be assessed. Please see plan below for detailed recommendations.   Diagnoses:  Active Hospital problems: Principal Problem:   Medically noncompliant Active Problems:   Major depressive disorder    Plan    ## Psychiatric Medication Recommendations:  Continue patient on Prozac 20 mg daily for depression Continue patient on Seroquel 25 mg at night for mood Discontinue patient on Cymbalta 30 mg p.o. every day for   ## Medical Decision Making Capacity:  Patient is in her own legal guardian, Chiara Coltrin is her power of attorney.   ## Further Work-up:  -- No further workup needed at this time EKG or U/A -- most recent EKG on 05/28/23 had QtC of 399 -- Pertinent labwork reviewed earlier this admission includes: CMP, BNP, EKG, UDS     ## Disposition:-- We recommend inpatient psychiatric hospitalization. Patient has been involuntarily committed on 05/28/23.    ## Behavioral / Environmental: -To minimize splitting of staff, assign one staff person to communicate all information from the team when feasible. or Utilize compassion and acknowledge the patient's experiences while setting clear and realistic expectations for care.                ## Safety and Observation Level:  - Based on my clinical evaluation, I estimate the patient to be at low risk of self harm in the current setting. - At this time, we recommend  routine. This decision is based on my review of the chart including patient's history and current presentation, interview of the patient, mental status examination, and consideration of suicide risk including evaluating suicidal ideation, plan, intent, suicidal or self-harm behaviors, risk factors, and protective factors. This judgment is based on our ability to directly address suicide risk, implement suicide prevention strategies, and develop a safety plan while the patient is in the clinical setting. Please contact our team if there is a concern that risk level has changed.   CSSR Risk Category:C-SSRS RISK CATEGORY: No Risk   Suicide Risk Assessment: Patient has following modifiable risk factors for suicide: under treated depression  and medication noncompliance, which we are addressing  by recommending inpatient psychiatric admission. Patient has following non-modifiable or demographic risk factors for suicide: separation or divorce Patient has the following protective factors against suicide: Supportive family   Thank you for this consult request. Recommendations have been communicated to the primary team.  We will recommend inpatient psychiatric hospitalization and continue to follow patient at this time.   [name], PMHNP       History of Present Illness  Relevant Aspects of Hospital ED Course:  Admitted on 05/28/2023 for medical and psychiatric non compliance   Patient Report:  Rose Bridges, 63 y.o., female patient seen face to face by this provider, consulted with Dr. Woodroe Mode; and chart reviewed on 06/03/23.  On evaluation Rose Bridges reports that she is ready to go home, when this provider asked her where she will be going to, she stated to the facility that she came  from.  She states if she needs to come back she will come back, but feels she needs to leave the hospital.  Patient continues to be in a dysphoric mood she begins telling this provider how her mother passed away, and she is mad at her brother because he felt the need to take over her life and control her and where she lives.  She states that she is over people, she is tired of people telling her what to do and what to eat.  She says she told her brother "family do not love on me when I fall, so it seems as if family does not care."She then states as she continues to talk to provider that she does not want to go back to [facility], as she feels that it was a set up between her brother and the owner and the owner just has to take all of her money.  She states they do not care for her well at the facility.  She states that she wants to go to [place] and live with her daughter-in-law.  She then begins saying how she got upset with her granddaughter because her granddaughter told the staff here  what she likes to eat, chicken noodle soup, she begins to get irritable because she feels as if her granddaughter should not have told the staff what she likes to eat, she states is nobody business what I eat and if I want to eat.  Patient does not want to answer questions that this provider ask, she only wants to talk about her grievances with her family, facility, and [facility].   During evaluation Rose Bridges is sitting up in the bed and appears to be  in no acute distress. She is alert, oriented x  3, she will not answer orientation questions stating "oh no I am not playing that game "but then says who the current president is, and discusses his wife, and discusses the current state of the [place], and how she thinks that he is. Her mood is dyphoric with congruent affect. She has normal speech, and appropriate behavior.  Objectively there is no evidence of psychosis/mania or delusional thinking.  Patient is able to converse coherently, goal directed thoughts, no distractibility, or pre-occupation. She denies suicidal/self-harm/homicidal ideation, psychosis, and paranoia.  This provider ask if she can come back and talk with her tomorrow, she at first has no, when provider starts leaving out of the door of her room and says ok, she states "if you want to come back tomorrow to see me you can, and if I feel like talking I will".   Psych ROS:  Depression: positive Anxiety:  patient denies Mania (lifetime and current): patient denies Psychosis: (lifetime and current): patient denies  Collateral information:  Contacted None  Review of Systems  Psychiatric/Behavioral:  Positive for depression.      Psychiatric and Social History  Psychiatric History:  Information collected from patient and Irasema Chalk   Prev Dx/Sx: alcohol dependence, depression, anxiety and altered mental status Current Psych Provider: none Home Meds (current): cymbalta Previous Med Trials: unknown Therapy: none   Prior Psych  Hospitalization: none  Prior Self Harm: none Prior Violence: none   Family Psych History: Sister - bipolar Family Hx suicide: none   Social History:  Developmental Hx: unknown Educational Hx: unknown Occupational Hx: not employed Armed forces operational officer Hx: none Living Situation: SNF Spiritual Hx: none Access to weapons/lethal means: none    Substance History Alcohol: HX of alcohol abuse -  not currently drinking  Tobacco: none Illicit drugs: none Prescription drug abuse: none Rehab hx: none  Exam Findings  Physical Exam:  Vital Signs:  Temp:  [97.7 F (36.5 C)-98.3 F (36.8 C)] 97.7 F (36.5 C) (02/03 1408) Pulse Rate:  [93-101] 93 (02/03 1408) Resp:  [18] 18 (02/03 1408) BP: (112-133)/(70-93) 112/70 (02/03 1408) SpO2:  [96 %] 96 % (02/03 1408) Blood pressure 112/70, pulse 93, temperature 97.7 F (36.5 C), temperature source Oral, resp. rate 18, height 5\' 4"  (1.626 m), weight 54 kg, SpO2 96%. Body mass index is 20.43 kg/m.  Physical Exam Vitals and nursing note reviewed. Exam conducted with a chaperone present.  Neurological:     Mental Status: She is alert.  Psychiatric:        Attention and Perception: Attention normal.        Mood and Affect: Mood is depressed. Affect is blunt and flat.        Speech: Speech normal.        Behavior: Behavior is withdrawn. Behavior is cooperative.        Thought Content: Thought content normal.        Judgment: Judgment is inappropriate.     Mental Status Exam: General Appearance: Fairly Groomed  Orientation:  Full (Time, Place, and Person)  Memory:  Immediate;   Fair Remote;   Fair  Concentration:  Concentration: Fair and Attention Span: Good  Recall:  Good  Attention  Fair  Eye Contact:  Good  Speech:  Clear and Coherent  Language:  Good  Volume:  Normal  Mood: dysohoric  Affect:  Appropriate, Blunt, and Flat  Thought Process:  Coherent  Thought Content:  Logical and Obsessions  Suicidal Thoughts:  No  Homicidal Thoughts:  No   Judgement:  Fair  Insight:  Fair  Psychomotor Activity:  Normal  Akathisia:  No  Fund of Knowledge:  Fair      Assets:  Manufacturing systems engineer Desire for Improvement Housing Social Support  Cognition:  WNL  ADL's:  Impaired  AIMS (if indicated):        Other History   These have been pulled in through the EMR, reviewed, and updated if appropriate.  Family History:  The patient's family history includes Heart attack in her mother; Hypertension in her mother; Lung cancer in her father; Stroke in her brother, mother, and sister.  Medical History: 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[facility])    Metabolic encephalopathy    Osteogenesis imperfecta    Osteogenesis imperfecta    Osteoporosis    Personality disorder (HCC)    PVD (peripheral vascular disease) (HCC)    Stroke [facility])     Surgical History: Past Surgical History:  Procedure Laterality Date   ABDOMINAL HYSTERECTOMY     BIOPSY  07/05/2022   Procedure: BIOPSY;  Surgeon: Lemar Lofty., MD;  Location: [facility];  Service: Gastroenterology;;   ESOPHAGOGASTRODUODENOSCOPY N/A 07/05/2022   Procedure: ESOPHAGOGASTRODUODENOSCOPY (EGD);  Surgeon: Lemar Lofty., MD;  Location: [facility];  Service: Gastroenterology;  Laterality: N/A;   EUS N/A 07/05/2022   Procedure: UPPER ENDOSCOPIC ULTRASOUND (  EUS) LINEAR;  Surgeon: Meridee Score Netty Starring., MD;  Location: [facility];  Service: Gastroenterology;  Laterality: N/A;   HIP ARTHROPLASTY Left 09/26/2015   Procedure: ARTHROPLASTY BIPOLAR HIP (HEMIARTHROPLASTY);  Surgeon: Sheral Apley, MD;  Location: [facility] OR;  Service:  Orthopedics;  Laterality: Left;   ORIF HUMERUS FRACTURE Right 11/06/2016   Procedure: OPEN REDUCTION INTERNAL FIXATION (ORIF) PROXIMAL HUMERUS FRACTURE;  Surgeon: Sheral Apley, MD;  Location: [facility] OR;  Service: Orthopedics;  Laterality: Right;     Medications:   Current Facility-Administered Medications:    aspirin chewable tablet 81 mg, 81 mg, Oral, Daily, Long, Arlyss Repress, MD, 81 mg at 06/03/23 4098   FLUoxetine (PROZAC) capsule 20 mg, 20 mg, Oral, Daily, Motley-Mangrum, Royalty Fakhouri A, PMHNP, 20 mg at 06/03/23 1191   levETIRAcetam (KEPPRA) tablet 500 mg, 500 mg, Oral, BID, Long, Arlyss Repress, MD, 500 mg at 06/03/23 4782  Current Outpatient Medications:    acetaminophen (TYLENOL) 500 MG tablet, Take 1,000 mg by mouth 3 (three) times daily., Disp: , Rfl:    aspirin 81 MG chewable tablet, Chew 81 mg by mouth daily., Disp: , Rfl:    BIOFREEZE COOL THE PAIN 4 % GEL, Apply 1 application  topically See admin instructions. Apply to both hands and knees 2 times a day, Disp: , Rfl:    bisacodyl (DULCOLAX) 10 MG suppository, Place 10 mg rectally daily as needed (for constipation)., Disp: , Rfl:    calcitonin, salmon, (MIACALCIN/FORTICAL) 200 UNIT/ACT nasal spray, Place 1 spray into alternate nostrils daily. (Patient taking differently: 1 spray See admin instructions. Instill 1 spray into the right nostril once a day- per [facility]), Disp: 3.7 mL, Rfl: 12   calcium carbonate (TUMS - DOSED IN MG ELEMENTAL CALCIUM) 500 MG chewable tablet, Chew 1 tablet by mouth 2 (two) times daily., Disp: , Rfl:    celecoxib (CELEBREX) 50 MG capsule, Take 50 mg by mouth daily., Disp: , Rfl:    DULoxetine (CYMBALTA) 30 MG capsule, Take 1 capsule (30 mg total) by mouth daily., Disp: 30 capsule, Rfl: 0   FIBER PO, Take 0.4 g by mouth 3 (three) times daily between meals., Disp: , Rfl:    levETIRAcetam (KEPPRA) 500 MG tablet, Take 1 tablet (500 mg total) by mouth 2 (two) times daily., Disp: , Rfl:    Lidocaine 3 % CREA, Apply 1 application   topically See admin instructions. Apply to both knees 3 times a day, Disp: , Rfl:    LORazepam (ATIVAN) 0.5 MG tablet, Take 0.5 mg by mouth 2 (two) times daily as needed for anxiety., Disp: , Rfl:    magnesium oxide (MAG-OX) 400 (240 Mg) MG tablet, Take 400 mg by mouth daily., Disp: , Rfl:    Menthol-Methyl Salicylate (MUSCLE RUB) 10-15 % CREA, Apply 1 Application topically 2 (two) times daily as needed for muscle pain., Disp: , Rfl:    oxyCODONE (OXY IR/ROXICODONE) 5 MG immediate release tablet, Take 5 mg by mouth every 6 (six) hours as needed for moderate pain (pain score 4-6) or severe pain (pain score 7-10)., Disp: , Rfl:    pantoprazole (PROTONIX) 40 MG tablet, Take 40 mg by mouth daily., Disp: , Rfl:    polyethylene glycol (MIRALAX / GLYCOLAX) 17 g packet, Take 17 g by mouth at bedtime as needed for mild constipation (mix into 6 ounces of fluid)., Disp: , Rfl:    pregabalin (LYRICA) 100 MG capsule, Take 100 mg by mouth 3 (three) times daily., Disp: , Rfl:  senna (SENOKOT) 8.6 MG TABS tablet, Take 1 tablet by mouth daily as needed for mild constipation., Disp: , Rfl:    Vitamin D, Ergocalciferol, (DRISDOL) 1.25 MG (50000 UNIT) CAPS capsule, Take 1 capsule (50,000 Units total) by mouth every 7 (seven) days. (Patient taking differently: Take 50,000 Units by mouth every Thursday.), Disp: 5 capsule, Rfl:    cephALEXin (KEFLEX) 500 MG capsule, Take 1 capsule (500 mg total) by mouth 3 (three) times daily. (Patient not taking: Reported on 05/28/2023), Disp: 20 capsule, Rfl: 0   gabapentin (NEURONTIN) 300 MG capsule, Take 1 capsule (300 mg total) by mouth 3 (three) times daily. (Patient not taking: Reported on 05/17/2023), Disp: 10 capsule, Rfl: 0  Allergies: Allergies  Allergen Reactions   Codeine Other (See Comments)    Migraine     Willim Turnage MOTLEY-MANGRUM, PMHNP

## 2023-06-03 NOTE — ED Notes (Signed)
Patient resting in bed with tech in room to change patient brief at this time

## 2023-06-03 NOTE — ED Provider Notes (Signed)
Emergency Medicine Observation Re-evaluation Note  Marilin Kofman is a 63 y.o. female, seen on rounds today.  Pt initially presented to the ED for complaints of IVC and Altered Mental Status Currently, the patient is awaiting inpatient.  Placement  Physical Exam  BP (!) 133/93 (BP Location: Left Arm)   Pulse (!) 101   Temp 98.3 F (36.8 C) (Oral)   Resp 18   Ht 5\' 4"  (1.626 m)   Wt 54 kg   SpO2 96%   BMI 20.43 kg/m  Physical Exam Alert and no acute distress  ED Course / MDM  EKG:EKG Interpretation Date/Time:  Tuesday May 28 2023 13:35:00 EST Ventricular Rate:  139 PR Interval:  136 QRS Duration:  83 QT Interval:  262 QTC Calculation: 399 R Axis:   83  Text Interpretation: Sinus tachycardia Borderline right axis deviation Low voltage, precordial leads Abnormal T, consider ischemia, diffuse leads Artifact in lead(s) I II III aVR aVL aVF V5 Similar to Dec 8th tracing Confirmed by [name] [phone]) on 05/28/2023 4:06:47 PM  I have reviewed the labs performed to date as well as medications administered while in observation.  Recent changes in the last 24 hours include none.  Plan  Current plan is for placement.    Bethann Berkshire, MD 06/03/23 [phone]

## 2023-06-04 DIAGNOSIS — F329 Major depressive disorder, single episode, unspecified: Secondary | ICD-10-CM | POA: Diagnosis not present

## 2023-06-04 NOTE — ED Notes (Addendum)
 Pt reports to daughter that she was being discharged today and that someone from upstairs had told her that. Daughter came to nurse station request that pt wants her cellphone and her wallet with her credits cards so she can leave with her daughter. Pt belongings were search with daughter and cell phone was found but no wallet or credit cards. Daughter states she will check with nursing home about wallet and cards.

## 2023-06-04 NOTE — ED Notes (Signed)
 [facility] called pts daughter in law Damien to inquire about the possibility of pt moving to [place] . Recently pt expressed a renewed interest in moving to [place]  and discussed the possibility with [facility] yesterday. Damien plans to move by July 1st in order to have her daughter start college. Damien suggested the possibility of pt finding a facility and moving before July 1st.   [facility] explained that pt has not been picked up by a inpatient psychiatric facility due to high medical needs and not being able to complete her ADL's. Pt has been more compliant with her medications, is stable and in a more positive mood. [facility] also explained that pt could be psychiatrically cleared and discharged. [facility] said that the provider and [facility] would speak with pts facility, [facility] to explain that pt is not being picked up by a psychiatric facility, pt is stable and may be cleared for discharge to see if they are more amenable to having pt return to the facility temporarily while she waits to move to [place] .   [facility] explained that if the facility is unwilling to have pt return then a TOC consult would be put in to provide assistance with finding another temporary nursing facility for pt before she moves to [place] . Pts daughter in law said that she would work on possibly changing the POA for pts trust to her from pts brother to assist with pts finances for the move to [place] . Pts daughter in law is aware that pt will need to update Medicare and apply for Medicaid in [place] .  Pts daughter in law agreed with this plan and to stay in touch with [facility] if there any changes. Pts daughter in law was appreciative of the information.   Chesley Holt, [facility]  06/04/23

## 2023-06-04 NOTE — ED Provider Notes (Signed)
 Emergency Medicine Observation Re-evaluation Note  Rose Bridges is a 63 y.o. female, seen on rounds today.  Pt initially presented to the ED for complaints of IVC and Altered Mental Status Currently, the patient is asleep.  Physical Exam  BP 114/72 (BP Location: Left Arm)   Pulse 86   Temp 97.8 F (36.6 C) (Oral)   Resp 20   Ht 5' 4 (1.626 m)   Wt 54 kg   SpO2 95%   BMI 20.43 kg/m  Physical Exam General: Asleep Cardiac: regular rate Lungs: equal chest rise Psych: not assessed   ED Course / MDM  EKG:EKG Interpretation Date/Time:  Tuesday May 28 2023 13:35:00 EST Ventricular Rate:  139 PR Interval:  136 QRS Duration:  83 QT Interval:  262 QTC Calculation: 399 R Axis:   83  Text Interpretation: Sinus tachycardia Borderline right axis deviation Low voltage, precordial leads Abnormal T, consider ischemia, diffuse leads Artifact in lead(s) I II III aVR aVL aVF V5 Similar to Dec 8th tracing Confirmed by Darra Chew [phone]) on 05/28/2023 4:06:47 PM  I have reviewed the labs performed to date as well as medications administered while in observation.  Recent changes in the last 24 hours include none.  Plan  Current plan is for psychiatric placement.    Francesca Elsie CROME, MD 06/04/23 [phone]

## 2023-06-04 NOTE — Discharge Instructions (Signed)
 Please follow up with the consulting psychiatric provider provided at [facility] as needed.   Discharge recommendations:  Patient is to take medications as prescribed. Please see information for follow-up appointment with psychiatry and therapy. Please follow up with your primary care provider for all medical related needs.   Therapy: We recommend that patient participate in individual therapy to address mental health concerns.  Medications: The patient or guardian is to contact a medical professional and/or outpatient provider to address any new side effects that develop. The patient or guardian should update outpatient providers of any new medications and/or medication changes.   Atypical antipsychotics: If you are prescribed an atypical antipsychotic, it is recommended that your height, weight, BMI, blood pressure, fasting lipid panel, and fasting blood sugar be monitored by your outpatient providers.  Safety:  The patient should abstain from use of illicit substances/drugs and abuse of any medications. If symptoms worsen or do not continue to improve or if the patient becomes actively suicidal or homicidal then it is recommended that the patient return to the closest hospital emergency department, the [facility], or call 911 for further evaluation and treatment. National Suicide Prevention Lifeline 1-800-SUICIDE or (516)027-9559.  About 988 988 offers 24/7 access to trained crisis counselors who can help people experiencing mental health-related distress. People can call or text 988 or chat 988lifeline.org for themselves or if they are worried about a loved one who may need crisis support.  Crisis Mobile: [facility]:                     458-769-7150 (for crisis response 24 hours a day) [facility] Hotline:                                            770 123 4747

## 2023-06-04 NOTE — Progress Notes (Signed)
 LCSW Progress Note  [id number]   Josefine Fuhr  06/04/2023  1:06 AM    Inpatient Behavioral Health Placement  Pt meets inpatient criteria per Cathaleen Adam, PMHNP. There are no available beds within [facility] system per [facility] AC Antoinette Cillo, RN  Referral was sent to the following facilities;   Destination  Service Provider Address Phone [facility] [address] [place], [place] [address] [phone]  [facility] [address] [place] [address] [phone]  [facility] [address] [place] [address] [phone]  [facility] [address] [place], [place] [address] [phone]  [facility] [address], [place] [address] [phone]  [facility] [address] [place] [address] [phone]  [facility] [address] [place], [place] [address] [phone]  [facility] [address] [place] [address] [phone]  [facility] [address] [place] [address] [phone]  [facility] [address], [place] [address] [phone]  [facility] [address] [place] [address] [phone]  [facility] [address], [place] [address] [phone]  [facility] [address], [place] [address] [phone]  [facility] [address], [place] [address] [phone]  [facility] [address] [place] [address] [phone]  [facility] EFAX  [address] [place] [phone]  [facility] [address], [place] [address] [phone]  [facility] [address], [place] [address] [phone]  [facility] [address] [place] [address] [phone]  [facility] [address], [place] [address] [phone]  [facility] [address] [place], [place] [address] [phone]  [facility] [address], [place] [address] [phone]  [facility] [place] [facility] [place] [phone]  [facility] [address] [place] [address] [phone]  [facility] [address], [place] [address] [phone]  [facility], [place] [phone]  [facility] [address] [place] [address] [phone]  [facility] [address] [place] [address] [phone]  [facility] [address] [place] [address] [phone]  [facility] [address], [place] [address] [phone]  [facility] [place] [phone]  [facility] [address] [place] [address] [phone]    Situation ongoing,  CSW will follow up.    Mitzie GEANNIE Pinal, MSW, LCSWA 06/04/2023 1:06 AM
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
## 2023-06-05 DIAGNOSIS — F329 Major depressive disorder, single episode, unspecified: Secondary | ICD-10-CM | POA: Diagnosis not present

## 2023-06-05 NOTE — Progress Notes (Signed)
 06/05/2023  1013  Called [address] [phone] Gave report to Kaitlyn. Kaitlyn stated that no one at the facility knew she was returning today.

## 2023-06-05 NOTE — Care Management (Addendum)
 Received secure chat from bedside RN regarding patient being discharged back to [facility].  This RNCM spoke  Starr w/[name] who reports they need AVS prior to report being given. Notified EDP, RN, awaiting discharge summary/orders.   - 4:26pm This RNCM received call from [facility] w/[name] inquiring about status of discharge. Per chart review dc is pending. Erie reports dc needs to be by 5pm due to SNF needs to get medications for patient upon her return. IF discharge is after 5pm, patient will need to dc tomorrow. Notified Dr. Charlyn   TOC following.    - 4:36pm spoke with Erie with [facility] to advise discharge is in. This RNCM printed AVS. Erie with [facility] confirms receipt of AVS. Provided  report # [phone], room# 807.  RN will call [name].  No additional TOC needs.

## 2023-06-05 NOTE — ED Provider Notes (Signed)
 Emergency Medicine Observation Re-evaluation Note  Rose Bridges is a 63 y.o. female, seen on rounds today.  Pt initially presented to the ED for complaints of IVC and Altered Mental Status Currently, the patient is resting.  Physical Exam  BP 121/75 (BP Location: Left Arm)   Pulse 95   Temp 98.6 F (37 C) (Oral)   Resp 18   Ht 5' 4 (1.626 m)   Wt 54 kg   SpO2 94%   BMI 20.43 kg/m  Physical Exam General: no acute distress Lungs: normal effort Psych: no agitation or psychosis  ED Course / MDM  EKG:EKG Interpretation Date/Time:  Tuesday May 28 2023 13:35:00 EST Ventricular Rate:  139 PR Interval:  136 QRS Duration:  83 QT Interval:  262 QTC Calculation: 399 R Axis:   83  Text Interpretation: Sinus tachycardia Borderline right axis deviation Low voltage, precordial leads Abnormal T, consider ischemia, diffuse leads Artifact in lead(s) I II III aVR aVL aVF V5 Similar to Dec 8th tracing Confirmed by Darra Chew [phone]) on 05/28/2023 4:06:47 PM  I have reviewed the labs performed to date as well as medications administered while in observation.  No recent changes in the last 24 hours.  Plan  Current plan is for discharge. Psychiatry cleared her yesterday.    Freddi Hamilton, MD 06/05/23 [phone]

## 2023-06-05 NOTE — Progress Notes (Signed)
 Per provider patient has been PSYCH cleared, at this time patient should return back to facility.   [place] Artasia Thang LCSW-A   06/05/2023 9:23 AM

## 2023-06-05 NOTE — Progress Notes (Signed)
 Called [name] for transportation. Patient placed on their list.

## 2023-06-24 ENCOUNTER — Telehealth: Payer: Self-pay

## 2023-06-24 DIAGNOSIS — K839 Disease of biliary tract, unspecified: Secondary | ICD-10-CM

## 2023-06-24 DIAGNOSIS — Z8719 Personal history of other diseases of the digestive system: Secondary | ICD-10-CM

## 2023-06-24 NOTE — Telephone Encounter (Signed)
-----   Message from [facility] Marylu Lund H sent at 01/29/2023 11:36 AM EDT ----- Regarding: MRCP Patient will be due for 06-2023

## 2023-06-24 NOTE — Telephone Encounter (Signed)
 Order placed for MRCP.  Message to schedulers to call patient to get her scheduled in March (See Office note 01-29-23)

## 2023-07-02 NOTE — Telephone Encounter (Signed)
 Letter mailed to patient.

## 2023-08-08 ENCOUNTER — Emergency Department (HOSPITAL_COMMUNITY)
Admission: EM | Admit: 2023-08-08 | Discharge: 2023-08-09 | Disposition: A | Discharge status: Home / Self Care | Attending: Emergency Medicine | Admitting: Emergency Medicine

## 2023-08-08 ENCOUNTER — Other Ambulatory Visit: Payer: Self-pay

## 2023-08-08 DIAGNOSIS — Z7982 Long term (current) use of aspirin: Secondary | ICD-10-CM | POA: Diagnosis not present

## 2023-08-08 DIAGNOSIS — R3912 Poor urinary stream: Secondary | ICD-10-CM | POA: Diagnosis present

## 2023-08-08 DIAGNOSIS — E860 Dehydration: Secondary | ICD-10-CM | POA: Insufficient documentation

## 2023-08-08 LAB — CBC WITH DIFFERENTIAL/PLATELET
Abs Immature Granulocytes: 0 10*3/uL (ref 0.00–0.07)
Basophils Absolute: 0 10*3/uL (ref 0.0–0.1)
Basophils Relative: 0 %
Eosinophils Absolute: 0.2 10*3/uL (ref 0.0–0.5)
Eosinophils Relative: 3 %
HCT: 44.7 % (ref 36.0–46.0)
Hemoglobin: 14.8 g/dL (ref 12.0–15.0)
Immature Granulocytes: 0 %
Lymphocytes Relative: 50 %
Lymphs Abs: 3.8 10*3/uL (ref 0.7–4.0)
MCH: 34.8 pg — ABNORMAL HIGH (ref 26.0–34.0)
MCHC: 33.1 g/dL (ref 30.0–36.0)
MCV: 105.2 fL — ABNORMAL HIGH (ref 80.0–100.0)
Monocytes Absolute: 0.6 10*3/uL (ref 0.1–1.0)
Monocytes Relative: 7 %
Neutro Abs: 3 10*3/uL (ref 1.7–7.7)
Neutrophils Relative %: 40 %
Platelets: 289 10*3/uL (ref 150–400)
RBC: 4.25 MIL/uL (ref 3.87–5.11)
RDW: 14.9 % (ref 11.5–15.5)
WBC: 7.6 10*3/uL (ref 4.0–10.5)
nRBC: 0 % (ref 0.0–0.2)

## 2023-08-08 MED ORDER — SODIUM CHLORIDE 0.9 % IV BOLUS: 1000.0000 mL | Freq: Once | INTRAVENOUS | Status: DC

## 2023-08-08 MED ORDER — SODIUM CHLORIDE 0.9 % IV BOLUS
1000.0000 mL | Freq: Once | INTRAVENOUS | Status: AC
  Administered 2023-08-08: 1000 mL via INTRAVENOUS

## 2023-08-08 MED ORDER — PREGABALIN 100 MG PO CAPS: 100.0000 mg | ORAL_CAPSULE | Freq: Three times a day (TID) | ORAL | Status: DC

## 2023-08-08 MED ORDER — LORAZEPAM 1 MG PO TABS: 1.0000 mg | ORAL_TABLET | Freq: Once | ORAL | Status: DC

## 2023-08-08 NOTE — ED Provider Notes (Addendum)
 [place] EMERGENCY DEPARTMENT AT [facility] Provider Note   CSN: [id number] Arrival date & time: 08/08/23  1340     History  Chief Complaint  Patient presents with   Abnormal Lab    Rose Bridges is a 63 y.o. female.  HPI Patient presents from nursing facility with staff concern of decreased urination.  Patient noted to have cognitive impairment, reportedly this is unchanged.  Staff reports that with concern for oliguria, patient transferred.  Patient herself denies any current complaints, but has stigmata of cognitive impairment.  Per EMS, nursing home, no other notable changes.    Home Medications Prior to Admission medications   Medication Sig Start Date End Date Taking? Authorizing Provider  acetaminophen (TYLENOL) 500 MG tablet Take 1,000 mg by mouth 3 (three) times daily.    [provider]  aspirin 81 MG chewable tablet Chew 81 mg by mouth daily.    [provider]  BIOFREEZE COOL THE PAIN 4 % GEL Apply 1 application  topically See admin instructions. Apply to both hands and knees 2 times a day    [provider]  bisacodyl (DULCOLAX) 10 MG suppository Place 10 mg rectally daily as needed (for constipation).    [provider]  calcitonin, salmon, (MIACALCIN/FORTICAL) 200 UNIT/ACT nasal spray Place 1 spray into alternate nostrils daily. Patient taking differently: 1 spray See admin instructions. Instill 1 spray into the right nostril once a day- per [facility] 04/08/22   Celine Mans, MD  calcium carbonate (TUMS - DOSED IN MG ELEMENTAL CALCIUM) 500 MG chewable tablet Chew 1 tablet by mouth 2 (two) times daily.    [provider]  celecoxib (CELEBREX) 50 MG capsule Take 50 mg by mouth daily. 05/06/23   [provider]  cephALEXin (KEFLEX) 500 MG capsule Take 1 capsule (500 mg total) by mouth 3 (three) times daily. Patient not taking: Reported on 05/28/2023 05/11/23   Derwood Kaplan, MD  DULoxetine (CYMBALTA) 30  MG capsule Take 1 capsule (30 mg total) by mouth daily. 04/12/23   Glade Lloyd, MD  FIBER PO Take 0.4 g by mouth 3 (three) times daily between meals.    [provider]  gabapentin (NEURONTIN) 300 MG capsule Take 1 capsule (300 mg total) by mouth 3 (three) times daily. Patient not taking: Reported on 05/17/2023 04/11/23   Glade Lloyd, MD  levETIRAcetam (KEPPRA) 500 MG tablet Take 1 tablet (500 mg total) by mouth 2 (two) times daily. 05/18/22   Leatha Gilding, MD  Lidocaine 3 % CREA Apply 1 application  topically See admin instructions. Apply to both knees 3 times a day    [provider]  LORazepam (ATIVAN) 0.5 MG tablet Take 0.5 mg by mouth 2 (two) times daily as needed for anxiety. 05/20/23   [provider]  magnesium oxide (MAG-OX) 400 (240 Mg) MG tablet Take 400 mg by mouth daily.    [provider]  Menthol-Methyl Salicylate (MUSCLE RUB) 10-15 % CREA Apply 1 Application topically 2 (two) times daily as needed for muscle pain. 04/11/23   Glade Lloyd, MD  oxyCODONE (OXY IR/ROXICODONE) 5 MG immediate release tablet Take 5 mg by mouth every 6 (six) hours as needed for moderate pain (pain score 4-6) or severe pain (pain score 7-10). 05/14/23   [provider]  pantoprazole (PROTONIX) 40 MG tablet Take 40 mg by mouth daily.    [provider]  polyethylene glycol (MIRALAX / GLYCOLAX) 17 g packet Take 17 g by mouth  at bedtime as needed for mild constipation (mix into 6 ounces of fluid).    [provider]  pregabalin (LYRICA) 100 MG capsule Take 100 mg by mouth 3 (three) times daily. 05/14/23   [provider]  senna (SENOKOT) 8.6 MG TABS tablet Take 1 tablet by mouth daily as needed for mild constipation.    [provider]  Vitamin D, Ergocalciferol, (DRISDOL) 1.25 MG (50000 UNIT) CAPS capsule Take 1 capsule (50,000 Units total) by mouth every 7 (seven) days. Patient taking differently: Take 50,000 Units by mouth  every Thursday. 04/12/22   Celine Mans, MD      Allergies    Codeine    Review of Systems   Review of Systems  Physical Exam Updated Vital Signs BP 92/73   Pulse (!) 114   Temp (!) 97 F (36.1 C) (Axillary)   Resp 13   SpO2 96%  Physical Exam Vitals and nursing note reviewed.  Constitutional:      General: She is not in acute distress.    Appearance: She is well-developed.     Comments: Frail, elderly appearing female in no distress, awake, alert, talkative, animated.  HENT:     Head: Normocephalic and atraumatic.  Eyes:     Conjunctiva/sclera: Conjunctivae normal.  Cardiovascular:     Rate and Rhythm: Normal rate and regular rhythm.  Pulmonary:     Effort: Pulmonary effort is normal. No respiratory distress.     Breath sounds: Normal breath sounds. No stridor.  Abdominal:     General: There is no distension.     Tenderness: There is no abdominal tenderness. There is no guarding.  Skin:    General: Skin is warm and dry.  Neurological:     Mental Status: She is alert.     Cranial Nerves: No cranial nerve deficit.  Psychiatric:        Cognition and Memory: Cognition is impaired.     ED Results / Procedures / Treatments   Labs (all labs ordered are listed, but only abnormal results are displayed) Labs Reviewed  CBC WITH DIFFERENTIAL/PLATELET - Abnormal; Notable for the following components:      Result Value   MCV 105.2 (*)    MCH 34.8 (*)    All other components within normal limits  URINALYSIS, ROUTINE W REFLEX MICROSCOPIC  COMPREHENSIVE METABOLIC PANEL WITH GFR    EKG None  Radiology No results found.  Procedures Procedures    Medications Ordered in ED Medications  LORazepam (ATIVAN) tablet 1 mg (0 mg Oral Hold 08/08/23 1853)  pregabalin (LYRICA) capsule 100 mg (100 mg Oral Patient Refused/Not Given 08/08/23 2151)  sodium chloride 0.9 % bolus 1,000 mL (1,000 mLs Intravenous Not Given 08/08/23 2151)  sodium chloride 0.9 % bolus 1,000 mL (1,000  mLs Intravenous New Bag/Given 08/08/23 1855)    ED Course/ Medical Decision Making/ A&P                                 Medical Decision Making Adult female with cognitive impairment presents with staff concern from facility of oliguria, concern for renal dysfunction versus infection. Patient is mildly hypotensive and tachycardic, both consistent with concern for dehydration versus infection.  Patient had fluids labs urinalysis ordered. Cardiac 95/105 sinus borderline Pulse ox 99% room air normal  Amount and/or Complexity of Data Reviewed External Data Reviewed: notes. Labs: ordered. Decision-making details documented in ED Course.  Risk Prescription  drug management.   Update: Patient declines catheterization.  Patient has capacity to do so.  Given the unremarkable vital signs, denial of complaints, no indication for sedation to facilitate catheterization.   11:01 PM Patient has been monitored for hours without decompensation, heart rate now 105 sinus.  She denies any complaints, including abdominal pain, and on exam has no guarding, rebound or evidence for retention.  She has received fluid resuscitation, and declines additional testing including additional IV access, chemistry panel, and declines catheterization several times.  Patient has been seen and evaluated here previously, including by behavioral health, and has no overt evidence for acute psychosis.  Patient has not previously had recognition of lack of capacity, and patient declines any additional he is here.  Given her stable vital signs, absence of decline and hours of monitoring here, reassuring CBC, absence of fever, denial of abdominal pain, patient will return to her nursing facility for further monitoring and management by her primary care team.        Final Clinical Impression(s) / ED Diagnoses Final diagnoses:  Dehydration    Rx / DC Orders ED Discharge Orders     None         Gerhard Munch,  MD 08/08/23 2302    Gerhard Munch, MD 08/08/23 2304

## 2023-08-08 NOTE — ED Notes (Signed)
 MD notified of difficulty starting IV on pt. IV team consult in.

## 2023-08-08 NOTE — ED Notes (Signed)
 Patient attempted to remove IV. This RN asked patient why she was taking her IV out, pt states "because I want to. And I'm not getting another IV." MD notified.

## 2023-08-08 NOTE — ED Notes (Addendum)
 Pt repeatedly refusing this RN and NT at bedside to reposition pt for in and out catheter. This RN explained to patient importance of in and out catheter, however pt refuses and requests to use bed pan instead. MD notified of refusal.

## 2023-08-08 NOTE — ED Notes (Signed)
 Pt repeatedly refusing vital signs and medication. Pt continues to curse at staff. This RN attempts verbal de-escalation and supportive listening. MD aware of refusal.

## 2023-08-08 NOTE — Discharge Instructions (Addendum)
 Although you have elected to forego some of today's evaluation the studies, your vital signs, and your presentation are generally reassuring.  However, with some evidence for dehydration is important to focus on staying well-hydrated.  Be sure to discuss tonight's presentation with your physician or return here for concerning changes in your condition.

## 2023-08-08 NOTE — ED Notes (Signed)
 Unsuccessful attempt at giving report to facility.

## 2023-08-08 NOTE — ED Triage Notes (Signed)
 Patient arrives from [facility] via EMS for eval of kidney function. Patient has not been urinating and they have not been able to obtain enough urine for a urinalysis via I/O cath. Patient at baseline mental status per EMS.

## 2023-08-09 NOTE — ED Notes (Signed)
 Pt refusing vital signs

## 2023-08-09 NOTE — ED Notes (Signed)
 Patient contact, Irving Burton notified of patient discharge.

## 2023-08-09 NOTE — ED Notes (Signed)
 Report given to [facility] RN at [facility].

## 2023-09-01 ENCOUNTER — Emergency Department (HOSPITAL_COMMUNITY)

## 2023-09-01 ENCOUNTER — Inpatient Hospital Stay (HOSPITAL_COMMUNITY)
Admission: EM | Admit: 2023-09-01 | Discharge: 2023-09-05 | DRG: 689 | Disposition: A | Discharge status: Skilled Nursing Facility | Attending: Internal Medicine | Admitting: Internal Medicine

## 2023-09-01 ENCOUNTER — Other Ambulatory Visit: Payer: Self-pay

## 2023-09-01 DIAGNOSIS — R451 Restlessness and agitation: Secondary | ICD-10-CM | POA: Diagnosis present

## 2023-09-01 DIAGNOSIS — I10 Essential (primary) hypertension: Secondary | ICD-10-CM | POA: Diagnosis present

## 2023-09-01 DIAGNOSIS — Z9181 History of falling: Secondary | ICD-10-CM

## 2023-09-01 DIAGNOSIS — Q780 Osteogenesis imperfecta: Secondary | ICD-10-CM

## 2023-09-01 DIAGNOSIS — R4182 Altered mental status, unspecified: Secondary | ICD-10-CM | POA: Diagnosis not present

## 2023-09-01 DIAGNOSIS — Z7982 Long term (current) use of aspirin: Secondary | ICD-10-CM

## 2023-09-01 DIAGNOSIS — Z8249 Family history of ischemic heart disease and other diseases of the circulatory system: Secondary | ICD-10-CM

## 2023-09-01 DIAGNOSIS — K219 Gastro-esophageal reflux disease without esophagitis: Secondary | ICD-10-CM | POA: Diagnosis present

## 2023-09-01 DIAGNOSIS — M810 Age-related osteoporosis without current pathological fracture: Secondary | ICD-10-CM | POA: Diagnosis present

## 2023-09-01 DIAGNOSIS — F319 Bipolar disorder, unspecified: Secondary | ICD-10-CM | POA: Diagnosis present

## 2023-09-01 DIAGNOSIS — Z7401 Bed confinement status: Secondary | ICD-10-CM

## 2023-09-01 DIAGNOSIS — N3000 Acute cystitis without hematuria: Secondary | ICD-10-CM | POA: Diagnosis not present

## 2023-09-01 DIAGNOSIS — Z885 Allergy status to narcotic agent status: Secondary | ICD-10-CM

## 2023-09-01 DIAGNOSIS — G9341 Metabolic encephalopathy: Secondary | ICD-10-CM | POA: Diagnosis present

## 2023-09-01 DIAGNOSIS — F209 Schizophrenia, unspecified: Secondary | ICD-10-CM | POA: Diagnosis present

## 2023-09-01 DIAGNOSIS — Z79899 Other long term (current) drug therapy: Secondary | ICD-10-CM

## 2023-09-01 DIAGNOSIS — R9431 Abnormal electrocardiogram [ECG] [EKG]: Secondary | ICD-10-CM

## 2023-09-01 DIAGNOSIS — Z8659 Personal history of other mental and behavioral disorders: Secondary | ICD-10-CM

## 2023-09-01 DIAGNOSIS — E860 Dehydration: Secondary | ICD-10-CM | POA: Diagnosis not present

## 2023-09-01 DIAGNOSIS — Z823 Family history of stroke: Secondary | ICD-10-CM

## 2023-09-01 DIAGNOSIS — Z515 Encounter for palliative care: Secondary | ICD-10-CM

## 2023-09-01 DIAGNOSIS — R54 Age-related physical debility: Secondary | ICD-10-CM | POA: Diagnosis present

## 2023-09-01 DIAGNOSIS — Z87891 Personal history of nicotine dependence: Secondary | ICD-10-CM

## 2023-09-01 DIAGNOSIS — N19 Unspecified kidney failure: Secondary | ICD-10-CM | POA: Diagnosis not present

## 2023-09-01 DIAGNOSIS — Z87898 Personal history of other specified conditions: Secondary | ICD-10-CM

## 2023-09-01 DIAGNOSIS — E785 Hyperlipidemia, unspecified: Secondary | ICD-10-CM | POA: Diagnosis present

## 2023-09-01 DIAGNOSIS — I739 Peripheral vascular disease, unspecified: Secondary | ICD-10-CM | POA: Diagnosis present

## 2023-09-01 DIAGNOSIS — I959 Hypotension, unspecified: Secondary | ICD-10-CM | POA: Diagnosis present

## 2023-09-01 DIAGNOSIS — B965 Pseudomonas (aeruginosa) (mallei) (pseudomallei) as the cause of diseases classified elsewhere: Secondary | ICD-10-CM | POA: Diagnosis present

## 2023-09-01 DIAGNOSIS — Z801 Family history of malignant neoplasm of trachea, bronchus and lung: Secondary | ICD-10-CM

## 2023-09-01 DIAGNOSIS — G40909 Epilepsy, unspecified, not intractable, without status epilepticus: Secondary | ICD-10-CM | POA: Diagnosis present

## 2023-09-01 DIAGNOSIS — Z8673 Personal history of transient ischemic attack (TIA), and cerebral infarction without residual deficits: Secondary | ICD-10-CM

## 2023-09-01 DIAGNOSIS — Z862 Personal history of diseases of the blood and blood-forming organs and certain disorders involving the immune mechanism: Secondary | ICD-10-CM

## 2023-09-01 DIAGNOSIS — R000 Tachycardia, unspecified: Principal | ICD-10-CM

## 2023-09-01 DIAGNOSIS — Z90710 Acquired absence of both cervix and uterus: Secondary | ICD-10-CM

## 2023-09-01 DIAGNOSIS — Z96642 Presence of left artificial hip joint: Secondary | ICD-10-CM | POA: Diagnosis present

## 2023-09-01 DIAGNOSIS — E876 Hypokalemia: Secondary | ICD-10-CM | POA: Diagnosis not present

## 2023-09-01 LAB — URINALYSIS, W/ REFLEX TO CULTURE (INFECTION SUSPECTED)
Glucose, UA: NEGATIVE mg/dL
Ketones, ur: 20 mg/dL — AB
Nitrite: NEGATIVE
Protein, ur: 30 mg/dL — AB
Specific Gravity, Urine: 1.028 (ref 1.005–1.030)
WBC, UA: >50 WBC/hpf (ref 0–5)
pH: 5 (ref 5.0–8.0)

## 2023-09-01 LAB — CBC WITH DIFFERENTIAL/PLATELET
Abs Immature Granulocytes: 0.03 10*3/uL (ref 0.00–0.07)
Basophils Absolute: 0 10*3/uL (ref 0.0–0.1)
Basophils Relative: 0 %
Eosinophils Absolute: 0.1 10*3/uL (ref 0.0–0.5)
Eosinophils Relative: 1 %
HCT: 43.4 % (ref 36.0–46.0)
Hemoglobin: 14.7 g/dL (ref 12.0–15.0)
Immature Granulocytes: 0 %
Lymphocytes Relative: 24 %
Lymphs Abs: 2.4 10*3/uL (ref 0.7–4.0)
MCH: 35.6 pg — ABNORMAL HIGH (ref 26.0–34.0)
MCHC: 33.9 g/dL (ref 30.0–36.0)
MCV: 105.1 fL — ABNORMAL HIGH (ref 80.0–100.0)
Monocytes Absolute: 0.9 10*3/uL (ref 0.1–1.0)
Monocytes Relative: 9 %
Neutro Abs: 6.5 10*3/uL (ref 1.7–7.7)
Neutrophils Relative %: 66 %
Platelets: 239 10*3/uL (ref 150–400)
RBC: 4.13 MIL/uL (ref 3.87–5.11)
RDW: 13.4 % (ref 11.5–15.5)
WBC: 9.9 10*3/uL (ref 4.0–10.5)
nRBC: 0 % (ref 0.0–0.2)

## 2023-09-01 LAB — RAPID URINE DRUG SCREEN, HOSP PERFORMED
Amphetamines: NOT DETECTED
Barbiturates: NOT DETECTED
Benzodiazepines: POSITIVE — AB
Cocaine: NOT DETECTED
Opiates: NOT DETECTED
Tetrahydrocannabinol: NOT DETECTED

## 2023-09-01 LAB — COMPREHENSIVE METABOLIC PANEL WITH GFR
ALT: 16 U/L (ref 0–44)
AST: 33 U/L (ref 15–41)
Albumin: 2.3 g/dL — ABNORMAL LOW (ref 3.5–5.0)
Alkaline Phosphatase: 69 U/L (ref 38–126)
Anion gap: 10 (ref 5–15)
BUN: 11 mg/dL (ref 8–23)
CO2: 23 mmol/L (ref 22–32)
Calcium: 7.9 mg/dL — ABNORMAL LOW (ref 8.9–10.3)
Chloride: 105 mmol/L (ref 98–111)
Creatinine, Ser: 0.5 mg/dL (ref 0.44–1.00)
GFR, Estimated: >60 mL/min (ref >60)
Glucose, Bld: 78 mg/dL (ref 70–99)
Potassium: 3.5 mmol/L (ref 3.5–5.1)
Sodium: 138 mmol/L (ref 135–145)
Total Bilirubin: 1.2 mg/dL (ref 0.0–1.2)
Total Protein: 5.1 g/dL — ABNORMAL LOW (ref 6.5–8.1)

## 2023-09-01 LAB — I-STAT CG4 LACTIC ACID, ED: Lactic Acid, Venous: 1.9 mmol/L (ref 0.5–1.9)

## 2023-09-01 LAB — ETHANOL: Alcohol, Ethyl (B): <15 mg/dL (ref <15)

## 2023-09-01 LAB — MAGNESIUM: Magnesium: 1.3 mg/dL — ABNORMAL LOW (ref 1.7–2.4)

## 2023-09-01 MED ORDER — CELECOXIB 50 MG PO CAPS: 50.0000 mg | ORAL_CAPSULE | Freq: Every day | ORAL | Status: DC

## 2023-09-01 MED ORDER — ONDANSETRON HCL 4 MG/2ML IJ SOLN: 4.0000 mg | Freq: Four times a day (QID) | INTRAMUSCULAR | Status: DC | PRN

## 2023-09-01 MED ORDER — ASPIRIN 81 MG PO CHEW
81.0000 mg | CHEWABLE_TABLET | Freq: Every day | ORAL | Status: DC
  Administered 2023-09-04 – 2023-09-05 (×2): 81 mg via ORAL
  Filled 2023-09-01 (×4): qty 1

## 2023-09-01 MED ORDER — LEVETIRACETAM 500 MG PO TABS
500.0000 mg | ORAL_TABLET | Freq: Two times a day (BID) | ORAL | Status: DC
  Administered 2023-09-01 – 2023-09-05 (×7): 500 mg via ORAL
  Filled 2023-09-01 (×8): qty 1

## 2023-09-01 MED ORDER — HALOPERIDOL LACTATE 5 MG/ML IJ SOLN: 5.0000 mg | Freq: Once | INTRAMUSCULAR | Status: DC

## 2023-09-01 MED ORDER — IOHEXOL 350 MG/ML SOLN
100.0000 mL | Freq: Once | INTRAVENOUS | Status: AC | PRN
  Administered 2023-09-01: 100 mL via INTRAVENOUS

## 2023-09-01 MED ORDER — ACETAMINOPHEN 325 MG PO TABS: 650.0000 mg | ORAL_TABLET | Freq: Four times a day (QID) | ORAL | Status: DC | PRN

## 2023-09-01 MED ORDER — SODIUM CHLORIDE 0.9 % IV SOLN
1.0000 g | INTRAVENOUS | Status: DC
  Administered 2023-09-02: 1 g via INTRAVENOUS
  Filled 2023-09-01 (×2): qty 10

## 2023-09-01 MED ORDER — SODIUM CHLORIDE 0.9 % IV BOLUS
1000.0000 mL | Freq: Once | INTRAVENOUS | Status: AC
  Administered 2023-09-01: 1000 mL via INTRAVENOUS

## 2023-09-01 MED ORDER — DULOXETINE HCL 30 MG PO CPEP
30.0000 mg | ORAL_CAPSULE | Freq: Two times a day (BID) | ORAL | Status: DC
  Administered 2023-09-01: 30 mg via ORAL
  Filled 2023-09-01: qty 1

## 2023-09-01 MED ORDER — PREGABALIN 50 MG PO CAPS
100.0000 mg | ORAL_CAPSULE | Freq: Three times a day (TID) | ORAL | Status: DC
  Filled 2023-09-01: qty 2

## 2023-09-01 MED ORDER — QUETIAPINE FUMARATE 25 MG PO TABS
12.5000 mg | ORAL_TABLET | ORAL | Status: DC
Start: 2023-09-01 — End: 2023-09-05
  Administered 2023-09-01 – 2023-09-04 (×3): 12.5 mg via ORAL
  Filled 2023-09-01 (×5): qty 1

## 2023-09-01 MED ORDER — SODIUM CHLORIDE 0.9 % IV SOLN
1.0000 g | Freq: Once | INTRAVENOUS | Status: AC
  Administered 2023-09-01: 1 g via INTRAVENOUS
  Filled 2023-09-01: qty 10

## 2023-09-01 MED ORDER — DULOXETINE HCL 30 MG PO CPEP
30.0000 mg | ORAL_CAPSULE | Freq: Every day | ORAL | Status: DC
  Filled 2023-09-01: qty 1

## 2023-09-01 MED ORDER — ACETAMINOPHEN 650 MG RE SUPP: 650.0000 mg | Freq: Four times a day (QID) | RECTAL | Status: DC | PRN

## 2023-09-01 MED ORDER — KETAMINE HCL 50 MG/5ML IJ SOSY
1.5000 mg/kg | PREFILLED_SYRINGE | Freq: Once | INTRAMUSCULAR | Status: AC
  Administered 2023-09-01: 81 mg via INTRAVENOUS
  Filled 2023-09-01 (×2): qty 10

## 2023-09-01 MED ORDER — SODIUM CHLORIDE 0.9 % IV SOLN: INTRAVENOUS | Status: DC

## 2023-09-01 MED ORDER — OXYCODONE HCL 5 MG PO TABS
5.0000 mg | ORAL_TABLET | Freq: Four times a day (QID) | ORAL | Status: DC | PRN
  Administered 2023-09-02 – 2023-09-05 (×8): 5 mg via ORAL
  Filled 2023-09-01 (×8): qty 1

## 2023-09-01 MED ORDER — PANTOPRAZOLE SODIUM 40 MG PO TBEC
40.0000 mg | DELAYED_RELEASE_TABLET | Freq: Every day | ORAL | Status: DC
  Administered 2023-09-04 – 2023-09-05 (×2): 40 mg via ORAL
  Filled 2023-09-01 (×4): qty 1

## 2023-09-01 MED ORDER — FOSFOMYCIN TROMETHAMINE 3 G PO PACK
3.0000 g | PACK | Freq: Once | ORAL | Status: DC
  Filled 2023-09-01: qty 3

## 2023-09-01 MED ORDER — GABAPENTIN 300 MG PO CAPS
300.0000 mg | ORAL_CAPSULE | Freq: Three times a day (TID) | ORAL | Status: DC
  Administered 2023-09-04 – 2023-09-05 (×4): 300 mg via ORAL
  Filled 2023-09-01 (×7): qty 1

## 2023-09-01 MED ORDER — HALOPERIDOL LACTATE 5 MG/ML IJ SOLN
5.0000 mg | Freq: Once | INTRAMUSCULAR | Status: AC
  Administered 2023-09-01: 5 mg via INTRAMUSCULAR
  Filled 2023-09-01: qty 1

## 2023-09-01 MED ORDER — ZIPRASIDONE MESYLATE 20 MG IM SOLR
20.0000 mg | Freq: Once | INTRAMUSCULAR | Status: AC | PRN
  Administered 2023-09-02: 20 mg via INTRAMUSCULAR
  Filled 2023-09-01: qty 20

## 2023-09-01 NOTE — H&P (Signed)
 History and Physical      Rose Bridges ZOX:[id number] DOB: [birth year] DOA: 09/01/2023; DOS: 09/01/2023  PCP: Diona Franklin *** Patient coming from: home ***  I have personally briefly reviewed patient's old medical records in [facility]  Chief Complaint: ***  HPI: Rose Bridges is a 63 y.o. female with medical history significant for *** who is admitted to [facility] on 09/01/2023 with *** after presenting from home*** to [facility] ED complaining of ***.   ***        ***  ED Course:  Vital signs in the ED were notable for the following: ***  Labs were notable for the following: ***  Per my interpretation, EKG in ED demonstrated the following:  ***  Imaging in the ED, per corresponding formal radiology read, was notable for the following: ***  While in the ED, the following were administered: ***  Subsequently, the patient was admitted  ***  ***red   Review of Systems: As per HPI otherwise 10 point review of systems negative.   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[facility])    Metabolic encephalopathy    Osteogenesis imperfecta    Osteogenesis imperfecta    Osteoporosis    Personality disorder (HCC)    PVD (peripheral vascular disease) (HCC)    Stroke [facility])     Past Surgical History:  Procedure Laterality Date   ABDOMINAL HYSTERECTOMY     BIOPSY  07/05/2022   Procedure: BIOPSY;  Surgeon: Normie Becton., MD;  Location: [facility];  Service: Gastroenterology;;   ESOPHAGOGASTRODUODENOSCOPY N/A 07/05/2022   Procedure: ESOPHAGOGASTRODUODENOSCOPY (EGD);  Surgeon: Normie Becton., MD;  Location:  [facility];  Service: Gastroenterology;  Laterality: N/A;   EUS N/A 07/05/2022   Procedure: UPPER ENDOSCOPIC ULTRASOUND (EUS) LINEAR;  Surgeon: Normie Becton., MD;  Location: [facility];  Service: Gastroenterology;  Laterality: N/A;   HIP ARTHROPLASTY Left 09/26/2015   Procedure: ARTHROPLASTY BIPOLAR HIP (HEMIARTHROPLASTY);  Surgeon: Saundra Curl, MD;  Location: [facility] OR;  Service: Orthopedics;  Laterality: Left;   ORIF HUMERUS FRACTURE Right 11/06/2016   Procedure: OPEN REDUCTION INTERNAL FIXATION (ORIF) PROXIMAL HUMERUS FRACTURE;  Surgeon: Saundra Curl, MD;  Location: [facility] OR;  Service: Orthopedics;  Laterality: Right;    Social History:  reports that she quit smoking about 5 years ago. Her smoking use included cigarettes. She has never used smokeless tobacco. She reports that she does not drink alcohol and does not use drugs.   Allergies  Allergen Reactions   Codeine Other (See Comments)    Migraines     Family History  Problem Relation Age of Onset   Stroke Mother    Heart attack Mother    Hypertension Mother    Stroke Sister    Stroke Brother    Lung cancer Father     Family history reviewed and not pertinent ***   Prior to Admission medications   Medication Sig Start Date End Date Taking? Authorizing Provider  acetaminophen  (TYLENOL ) 500 MG tablet Take 1,000 mg by mouth 3 (three) times daily.   Yes [provider]  aspirin   81 MG chewable tablet Chew 81 mg by mouth daily.   Yes [provider]  BIOFREEZE COOL THE PAIN 4 % GEL Apply 1 application  topically See admin instructions. Apply to both hands and knees 2 times a day   Yes [provider]  bisacodyl  (DULCOLAX) 10 MG suppository Place 10 mg rectally daily as needed (for constipation).   Yes [provider]  calcitonin, salmon, (MIACALCIN /FORTICAL) 200 UNIT/ACT nasal spray Place 1 spray into alternate nostrils daily. Patient taking differently: 1 spray See admin  instructions. Instill 1 spray into the right nostril once a day- per [facility] 04/08/22  Yes Ivin Marrow, MD  calcium  carbonate (TUMS - DOSED IN MG ELEMENTAL CALCIUM ) 500 MG chewable tablet Chew 1 tablet by mouth 2 (two) times daily.   Yes [provider]  celecoxib (CELEBREX) 50 MG capsule Take 50 mg by mouth daily. 05/06/23  Yes [provider]  DEPAKOTE ER 250 MG 24 hr tablet Take 250 mg by mouth at bedtime.   Yes [provider]  diclofenac Sodium (VOLTAREN) 1 % GEL Apply 2 g topically See admin instructions. Apply 2 grams to the right knee 2 times a day   Yes [provider]  DULoxetine  (CYMBALTA ) 30 MG capsule Take 1 capsule (30 mg total) by mouth daily. Patient taking differently: Take 30 mg by mouth 2 (two) times daily. 04/12/23  Yes Audria Leather, MD  FIBER PO Take 0.4 g by mouth 3 (three) times daily between meals.   Yes [provider]  gabapentin  (NEURONTIN ) 300 MG capsule Take 1 capsule (300 mg total) by mouth 3 (three) times daily. 04/11/23  Yes Audria Leather, MD  levETIRAcetam  (KEPPRA ) 500 MG tablet Take 1 tablet (500 mg total) by mouth 2 (two) times daily. 05/18/22  Yes Gherghe, Costin M, MD  LORazepam  (ATIVAN ) 0.5 MG tablet Take 0.5 mg by mouth every 6 (six) hours as needed for anxiety. 05/20/23  Yes [provider]  magnesium  oxide (MAG-OX) 400 (240 Mg) MG tablet Take 400 mg by mouth daily.   Yes [provider]  oxyCODONE  (OXY IR/ROXICODONE ) 5 MG immediate release tablet Take 5 mg by mouth every 8 (eight) hours as needed (for pain). 05/14/23  Yes [provider]  pantoprazole  (PROTONIX ) 40 MG tablet Take 40 mg by mouth daily before breakfast.   Yes [provider]  polyethylene glycol powder (GLYCOLAX /MIRALAX ) 17 GM/SCOOP powder Take 17 g by mouth daily as needed (to aid the bowels- mix into 4 to 6 ounces of fluid).   Yes [provider]  senna (SENOKOT) 8.6 MG TABS tablet Take 1 tablet by mouth  daily as needed for mild constipation.   Yes [provider]  Vitamin D , Ergocalciferol , (DRISDOL ) 1.25 MG (50000 UNIT) CAPS capsule Take 1 capsule (50,000 Units total) by mouth every 7 (seven) days. Patient taking differently: Take 50,000 Units by mouth every Thursday. 04/12/22  Yes Ivin Marrow, MD  cephALEXin  (KEFLEX ) 500 MG capsule Take 1 capsule (500 mg total) by mouth 3 (three) times daily. Patient not taking: Reported on 05/28/2023 05/11/23   Deatra Face, MD     Objective    Physical Exam: Vitals:   09/01/23 2015 09/01/23 2020 09/01/23 2030 09/01/23 2035  BP: 115/74 108/86 115/79 114/76  Pulse: (!) 134 (!) 124 (!) 118 (!) 114  Resp: 20 19 20 20   Temp:      TempSrc:      SpO2: 98% 98% 98% 100%  Weight:  General: appears to be stated age; alert, oriented Skin: warm, dry, no rash Head:  AT/[place] Mouth:  Oral mucosa membranes appear moist, normal dentition Neck: supple; trachea midline Heart:  RRR; did not appreciate any M/R/G Lungs: CTAB, did not appreciate any wheezes, rales, or rhonchi Abdomen: + BS; soft, ND, NT Vascular: 2+ pedal pulses b/l; 2+ radial pulses b/l Extremities: no peripheral edema, no muscle wasting Neuro: strength and sensation intact in upper and lower extremities b/l    *** Neuro: 5/5 strength of the proximal and distal flexors and extensors of the upper and lower extremities bilaterally; sensation intact in upper and lower extremities b/l; cranial nerves II through XII grossly intact; no pronator drift; no evidence suggestive of slurred speech, dysarthria, or facial droop; Normal muscle tone. No tremors. *** Neuro: In the setting of the patient's current mental status and associated inability to follow instructions, unable to perform full neurologic exam at this time.  As such, assessment of strength, sensation, and cranial nerves is limited at this time. Patient noted to spontaneously move all 4 extremities. No tremors.   ***    Labs on Admission: I have personally reviewed following labs and imaging studies  CBC: Recent Labs  Lab 09/01/23 1323  WBC 9.9  NEUTROABS 6.5  HGB 14.7  HCT 43.4  MCV 105.1*  PLT 239   Basic Metabolic Panel: Recent Labs  Lab 09/01/23 1323  NA 138  K 3.5  CL 105  CO2 23  GLUCOSE 78  BUN 11  CREATININE 0.50  CALCIUM  7.9*   GFR: Estimated Creatinine Clearance: 60.6 mL/min (by C-G formula based on SCr of 0.5 mg/dL). Liver Function Tests: Recent Labs  Lab 09/01/23 1323  AST 33  ALT 16  ALKPHOS 69  BILITOT 1.2  PROT 5.1*  ALBUMIN 2.3*   No results for input(s): "LIPASE", "AMYLASE" in the last 168 hours. No results for input(s): "AMMONIA" in the last 168 hours. Coagulation Profile: No results for input(s): "INR", "PROTIME" in the last 168 hours. Cardiac Enzymes: No results for input(s): "CKTOTAL", "CKMB", "CKMBINDEX", "TROPONINI" in the last 168 hours. BNP (last 3 results) No results for input(s): "PROBNP" in the last 8760 hours. HbA1C: No results for input(s): "HGBA1C" in the last 72 hours. CBG: No results for input(s): "GLUCAP" in the last 168 hours. Lipid Profile: No results for input(s): "CHOL", "HDL", "LDLCALC", "TRIG", "CHOLHDL", "LDLDIRECT" in the last 72 hours. Thyroid  Function Tests: No results for input(s): "TSH", "T4TOTAL", "FREET4", "T3FREE", "THYROIDAB" in the last 72 hours. Anemia Panel: No results for input(s): "VITAMINB12", "FOLATE", "FERRITIN", "TIBC", "IRON", "RETICCTPCT" in the last 72 hours. Urine analysis:    Component Value Date/Time   COLORURINE AMBER (A) 09/01/2023 1403   APPEARANCEUR CLOUDY (A) 09/01/2023 1403   LABSPEC 1.028 09/01/2023 1403   PHURINE 5.0 09/01/2023 1403   GLUCOSEU NEGATIVE 09/01/2023 1403   HGBUR MODERATE (A) 09/01/2023 1403   BILIRUBINUR SMALL (A) 09/01/2023 1403   KETONESUR 20 (A) 09/01/2023 1403   PROTEINUR 30 (A) 09/01/2023 1403   NITRITE NEGATIVE 09/01/2023 1403   LEUKOCYTESUR MODERATE (A)  09/01/2023 1403    Radiological Exams on Admission: CT Angio Chest PE W and/or Wo Contrast Result Date: 09/01/2023 CLINICAL DATA:  Concern for pulmonary embolism. Also abdominal pain. EXAM: CT ANGIOGRAPHY CHEST CT ABDOMEN AND PELVIS WITH CONTRAST TECHNIQUE: Multidetector CT imaging of the chest was performed using the standard protocol during bolus administration of intravenous contrast. Multiplanar CT image reconstructions and MIPs were obtained to evaluate the vascular anatomy. Multidetector  CT imaging of the abdomen and pelvis was performed using the standard protocol during bolus administration of intravenous contrast. RADIATION DOSE REDUCTION: This exam was performed according to the departmental dose-optimization program which includes automated exposure control, adjustment of the mA and/or kV according to patient size and/or use of iterative reconstruction technique. CONTRAST:  OMNIPAQUE  IOHEXOL  350 MG/ML SOLN COMPARISON:  Chest radiograph dated 05/17/2023. FINDINGS: Evaluation of this exam is limited due to respiratory motion. CTA CHEST FINDINGS Cardiovascular: There is no cardiomegaly. Trace pericardial effusion. Three-vessel coronary vascular calcification. The thoracic aorta is unremarkable. The origins of the great vessels of the aortic arch are patent. No pulmonary artery embolus identified. Mediastinum/Nodes: No hilar or mediastinal adenopathy. The esophagus is grossly unremarkable no mediastinal fluid collection. No mediastinal fluid collection. Lungs/Pleura: Bilateral lower lobe linear atelectasis/scarring. No focal consolidation, pleural effusion, or pneumothorax. The central airways are patent. Musculoskeletal: Osteopenia.  No acute osseous pathology. Review of the MIP images confirms the above findings. CT ABDOMEN and PELVIS FINDINGS No intra-abdominal free air or free fluid. Hepatobiliary: The liver is unremarkable. Mild dilatation. No calcified gallstone. Focal adenomyomatosis of the  gallbladder fundus. Pancreas: Unremarkable. No pancreatic ductal dilatation or surrounding inflammatory changes. Spleen: Normal in size without focal abnormality. Adrenals/Urinary Tract: The adrenal glands unremarkable. There is no hydronephrosis on either side. The visualized ureters and urinary bladder appear unremarkable. Stomach/Bowel: There is normal obstruction. The appendix is normal. Several duodenal diverticula measure up to 4 cm. Vascular/Lymphatic: Mild aortoiliac atherosclerotic disease. The IVC is unremarkable no portal gas. There is no adenopathy. Reproductive: Hysterectomy. Other: None Musculoskeletal: Left hip arthroplasty with associated streak artifact. Osteopenia with degenerative changes of the spine. No acute osseous pathology. Review of the MIP images confirms the above findings. IMPRESSION: 1. No acute intrathoracic, abdominal, or pelvic pathology. No CT evidence of pulmonary artery embolus. 2. Three-vessel coronary vascular calcification. 3.  Aortic Atherosclerosis (ICD10-I70.0). Electronically Signed   By: Angus Bark M.D.   On: 09/01/2023 20:49   CT ABDOMEN PELVIS W CONTRAST Result Date: 09/01/2023 CLINICAL DATA:  Concern for pulmonary embolism. Also abdominal pain. EXAM: CT ANGIOGRAPHY CHEST CT ABDOMEN AND PELVIS WITH CONTRAST TECHNIQUE: Multidetector CT imaging of the chest was performed using the standard protocol during bolus administration of intravenous contrast. Multiplanar CT image reconstructions and MIPs were obtained to evaluate the vascular anatomy. Multidetector CT imaging of the abdomen and pelvis was performed using the standard protocol during bolus administration of intravenous contrast. RADIATION DOSE REDUCTION: This exam was performed according to the departmental dose-optimization program which includes automated exposure control, adjustment of the mA and/or kV according to patient size and/or use of iterative reconstruction technique. CONTRAST:  OMNIPAQUE   IOHEXOL  350 MG/ML SOLN COMPARISON:  Chest radiograph dated 05/17/2023. FINDINGS: Evaluation of this exam is limited due to respiratory motion. CTA CHEST FINDINGS Cardiovascular: There is no cardiomegaly. Trace pericardial effusion. Three-vessel coronary vascular calcification. The thoracic aorta is unremarkable. The origins of the great vessels of the aortic arch are patent. No pulmonary artery embolus identified. Mediastinum/Nodes: No hilar or mediastinal adenopathy. The esophagus is grossly unremarkable no mediastinal fluid collection. No mediastinal fluid collection. Lungs/Pleura: Bilateral lower lobe linear atelectasis/scarring. No focal consolidation, pleural effusion, or pneumothorax. The central airways are patent. Musculoskeletal: Osteopenia.  No acute osseous pathology. Review of the MIP images confirms the above findings. CT ABDOMEN and PELVIS FINDINGS No intra-abdominal free air or free fluid. Hepatobiliary: The liver is unremarkable. Mild dilatation. No calcified gallstone. Focal adenomyomatosis of the gallbladder fundus.  Pancreas: Unremarkable. No pancreatic ductal dilatation or surrounding inflammatory changes. Spleen: Normal in size without focal abnormality. Adrenals/Urinary Tract: The adrenal glands unremarkable. There is no hydronephrosis on either side. The visualized ureters and urinary bladder appear unremarkable. Stomach/Bowel: There is normal obstruction. The appendix is normal. Several duodenal diverticula measure up to 4 cm. Vascular/Lymphatic: Mild aortoiliac atherosclerotic disease. The IVC is unremarkable no portal gas. There is no adenopathy. Reproductive: Hysterectomy. Other: None Musculoskeletal: Left hip arthroplasty with associated streak artifact. Osteopenia with degenerative changes of the spine. No acute osseous pathology. Review of the MIP images confirms the above findings. IMPRESSION: 1. No acute intrathoracic, abdominal, or pelvic pathology. No CT evidence of pulmonary artery  embolus. 2. Three-vessel coronary vascular calcification. 3.  Aortic Atherosclerosis (ICD10-I70.0). Electronically Signed   By: Angus Bark M.D.   On: 09/01/2023 20:49   CT HEAD WO CONTRAST ( ) Result Date: 09/01/2023 CLINICAL DATA:  Altered mental status EXAM: CT HEAD WITHOUT CONTRAST TECHNIQUE: Contiguous axial images were obtained from the base of the skull through the vertex without intravenous contrast. RADIATION DOSE REDUCTION: This exam was performed according to the departmental dose-optimization program which includes automated exposure control, adjustment of the mA and/or kV according to patient size and/or use of iterative reconstruction technique. COMPARISON:  05/28/2023 FINDINGS: Brain: No evidence of acute infarction, hemorrhage, hydrocephalus, extra-axial collection or mass lesion/mass effect. Mild chronic white matter ischemic changes are again noted. 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 Vascular: No hyperdense vessel or unexpected calcification. Skull: Normal. Negative for fracture or focal lesion. Sinuses/Orbits: No acute finding. Other: None. IMPRESSION: Chronic changes without acute abnormality. Electronically Signed   By: Violeta Grey M.D.   On: 09/01/2023 20:39      Assessment/Plan    Principal Problem:   AMS (altered mental status) Active Problems:   Acute cystitis    Agitation  ***        ***                  ***                  ***                  ***                  ***                 ***                  ***                  ***                  ***                  ***                  ***                  ***                 ***     DVT prophylaxis: SCD's ***  Code Status: Full code*** Family Communication: none*** Disposition Plan: Per Rounding Team Consults called: none***;  Admission status: ***    I SPENT GREATER THAN 75 *** MINUTES IN  CLINICAL CARE TIME/MEDICAL DECISION-MAKING IN COMPLETING THIS ADMISSION.     Gattis Kass Gustin Zobrist DO [facility] From 7PM - 7AM   09/01/2023, 10:35 PM   ***

## 2023-09-01 NOTE — ED Notes (Signed)
 Attempted to start 2nd IV, unsuccessful. Will request ultrasound IV

## 2023-09-01 NOTE — ED Notes (Signed)
 Patient transported to CT

## 2023-09-01 NOTE — ED Provider Notes (Signed)
 [place] EMERGENCY DEPARTMENT AT [facility] Provider Note   CSN: [id number] Arrival date & time: 09/01/23  1300     History  Chief Complaint  Patient presents with   Aggressive Behavior   IVC    Rose Bridges is a 63 y.o. female.  63 year old female with history of schizophrenia and bipolar disorder here due to increased agitation and threatening staff.  Patient placed under IVC by her facility due to these behaviors.  Apparently she has thrown objects out of the people.  She has threatened to kill her brother.  Police are at bedside and spoke to them at length about her.  They state that they have been there with her for several hours prior to arrival where she has not followed commands.  She has been boisterous and threatening to attack them.  EMS was called and patient given 5 mg of Versed  and Haldol prior to arrival.  Some improvement of her symptoms.  Patient is not cooperative at this time       Home Medications Prior to Admission medications   Medication Sig Start Date End Date Taking? Authorizing Provider  acetaminophen  (TYLENOL ) 500 MG tablet Take 1,000 mg by mouth 3 (three) times daily.    [provider]  aspirin  81 MG chewable tablet Chew 81 mg by mouth daily.    [provider]  BIOFREEZE COOL THE PAIN 4 % GEL Apply 1 application  topically See admin instructions. Apply to both hands and knees 2 times a day    [provider]  bisacodyl  (DULCOLAX) 10 MG suppository Place 10 mg rectally daily as needed (for constipation).    [provider]  calcitonin, salmon, (MIACALCIN /FORTICAL) 200 UNIT/ACT nasal spray Place 1 spray into alternate nostrils daily. Patient taking differently: 1 spray See admin instructions. Instill 1 spray into the right nostril once a day- per [facility] 04/08/22   Quillen, Michael, MD  calcium  carbonate (TUMS - DOSED IN MG ELEMENTAL CALCIUM ) 500 MG chewable tablet Chew 1 tablet by mouth 2 (two) times  daily.    [provider]  celecoxib (CELEBREX) 50 MG capsule Take 50 mg by mouth daily. 05/06/23   [provider]  cephALEXin  (KEFLEX ) 500 MG capsule Take 1 capsule (500 mg total) by mouth 3 (three) times daily. Patient not taking: Reported on 05/28/2023 05/11/23   Deatra Face, MD  DULoxetine  (CYMBALTA ) 30 MG capsule Take 1 capsule (30 mg total) by mouth daily. 04/12/23   Audria Leather, MD  FIBER PO Take 0.4 g by mouth 3 (three) times daily between meals.    [provider]  gabapentin  (NEURONTIN ) 300 MG capsule Take 1 capsule (300 mg total) by mouth 3 (three) times daily. Patient not taking: Reported on 05/17/2023 04/11/23   Audria Leather, MD  levETIRAcetam  (KEPPRA ) 500 MG tablet Take 1 tablet (500 mg total) by mouth 2 (two) times daily. 05/18/22   Gherghe, Costin M, MD  Lidocaine  3 % CREA Apply 1 application  topically See admin instructions. Apply to both knees 3 times a day    [provider]  LORazepam  (ATIVAN ) 0.5 MG tablet Take 0.5 mg by mouth 2 (two) times daily as needed for anxiety. 05/20/23   [provider]  magnesium  oxide (MAG-OX) 400 (240 Mg) MG tablet Take 400 mg by mouth daily.    [provider]  Menthol -Methyl Salicylate (MUSCLE RUB) 10-15 % CREA Apply 1 Application topically 2 (two) times daily as needed for muscle pain. 04/11/23  Audria Leather, MD  oxyCODONE  (OXY IR/ROXICODONE ) 5 MG immediate release tablet Take 5 mg by mouth every 6 (six) hours as needed for moderate pain (pain score 4-6) or severe pain (pain score 7-10). 05/14/23   [provider]  pantoprazole  (PROTONIX ) 40 MG tablet Take 40 mg by mouth daily.    [provider]  polyethylene glycol (MIRALAX  / GLYCOLAX ) 17 g packet Take 17 g by mouth at bedtime as needed for mild constipation (mix into 6 ounces of fluid).    [provider]  pregabalin  (LYRICA ) 100 MG capsule Take 100 mg by mouth 3 (three) times daily. 05/14/23   [provider]  senna (SENOKOT) 8.6 MG TABS tablet Take 1 tablet by mouth daily as needed for mild constipation.    [provider]  Vitamin D , Ergocalciferol , (DRISDOL ) 1.25 MG (50000 UNIT) CAPS capsule Take 1 capsule (50,000 Units total) by mouth every 7 (seven) days. Patient taking differently: Take 50,000 Units by mouth every Thursday. 04/12/22   Ivin Marrow, MD      Allergies    Codeine    Review of Systems   Review of Systems  Unable to perform ROS: Psychiatric disorder    Physical Exam Updated Vital Signs BP 105/84 (BP Location: Right Arm)   Pulse (!) 127   Temp 97.8 F (36.6 C) (Oral)   Resp 17   SpO2 98%  Physical Exam Vitals and nursing note reviewed.  Constitutional:      General: She is not in acute distress.    Appearance: Normal appearance. She is well-developed. She is not toxic-appearing.  HENT:     Head: Normocephalic and atraumatic.  Eyes:     General: Lids are normal.     Conjunctiva/sclera: Conjunctivae normal.     Pupils: Pupils are equal, round, and reactive to light.  Neck:     Thyroid : No thyroid  mass.     Trachea: No tracheal deviation.  Cardiovascular:     Rate and Rhythm: Normal rate and regular rhythm.     Heart sounds: Normal heart sounds. No murmur heard.    No gallop.  Pulmonary:     Effort: Pulmonary effort is normal. No respiratory distress.     Breath sounds: Normal breath sounds. No stridor. No decreased breath sounds, wheezing, rhonchi or rales.  Abdominal:     General: There is no distension.     Palpations: Abdomen is soft.     Tenderness: There is no abdominal tenderness. There is no rebound.  Musculoskeletal:        General: No tenderness. Normal range of motion.     Cervical back: Normal range of motion and neck supple.  Skin:    General: Skin is warm and dry.     Findings: No abrasion or rash.  Neurological:     Mental Status: She is alert and oriented to person, place, and time. Mental status is at  baseline.     GCS: GCS eye subscore is 4. GCS verbal subscore is 5. GCS motor subscore is 6.     Cranial Nerves: No cranial nerve deficit.     Sensory: No sensory deficit.     Motor: Motor function is intact.  Psychiatric:        Attention and Perception: Attention normal.        Mood and Affect: Affect is labile and inappropriate.        Speech: Speech is rapid and pressured and slurred.  Behavior: Behavior is agitated, aggressive, hyperactive and combative.        Thought Content: Thought content does not include suicidal ideation.     ED Results / Procedures / Treatments   Labs (all labs ordered are listed, but only abnormal results are displayed) Labs Reviewed  ETHANOL  RAPID URINE DRUG SCREEN, HOSP PERFORMED  CBC WITH DIFFERENTIAL/PLATELET  COMPREHENSIVE METABOLIC PANEL WITH GFR  URINALYSIS, W/ REFLEX TO CULTURE (INFECTION SUSPECTED)    EKG None  Radiology No results found.  Procedures Procedures    Medications Ordered in ED Medications  haloperidol lactate (HALDOL) injection 5 mg (has no administration in time range)    ED Course/ Medical Decision Making/ A&P                                 Medical Decision Making Amount and/or Complexity of Data Reviewed Labs: ordered. ECG/medicine tests: ordered.  Risk Prescription drug management.   Patient able to be redirected at this time.  I did order Haldol but patient has been cooperative and will hold at this time.  Urinalysis shows increased white blood cells.  Review of prior microbiology shows patient has had dirty urines last 1 show polymicrobial species.  Patient will be given dose of fosfomycin here.  She is afebrile at this time and she has no white count.  I think her underlying psychiatric illnesses was driving her current behavior.  She is now medically cleared for psychiatric disposition        Final Clinical Impression(s) / ED Diagnoses Final diagnoses:  None    Rx / DC Orders ED  Discharge Orders     None         Lind Repine, MD 09/01/23 1439

## 2023-09-01 NOTE — ED Triage Notes (Signed)
 BIBA from [facility] for aggressive behavior x 2 days- pt has been yelling and throwing things at staff, not taking meds. Pt also is reported to have a UTI. Versed  and Haldol given PTA, pt arrives in restraints and has IVC papaerwork

## 2023-09-01 NOTE — ED Provider Notes (Signed)
 Physical Exam  BP 114/76   Pulse (!) 114   Temp 98.6 F (37 C) (Oral)   Resp 20   Wt 54 kg   SpO2 100%   BMI 20.43 kg/m   Physical Exam  Procedures  .Sedation  Date/Time: 09/01/2023 10:13 PM  Performed by: Dalene Duck, MD Authorized by: Dalene Duck, MD   Consent:    Consent obtained:  Verbal   Consent given by:  Healthcare agent   Risks discussed:  Prolonged hypoxia resulting in organ damage Universal protocol:    Immediately prior to procedure, a time out was called: yes   Pre-sedation assessment:    Time since last food or drink:  8 hours   ASA classification: class 1 - normal, healthy patient     Mallampati score:  I - soft palate, uvula, fauces, pillars visible   Pre-sedation assessments completed and reviewed: airway patency and mental status   A pre-sedation assessment was completed prior to the start of the procedure Procedure details (see MAR for exact dosages):    Sedation:  Ketamine   Intended level of sedation: deep   Total Provider sedation time (minutes):  30 Post-procedure details:   A post-sedation assessment was completed following the completion of the procedure.   ED Course / MDM    Medical Decision Making Care assumed at 4 PM.  Patient is here with agitation.  Patient received Ativan  and Haldol prior to arrival.  Patient was noted to be hypotensive.  In-N-Out cath showed UTI and initially was given fosfomycin.  I was called around 4 PM by the nurse that patient is hypotensive with a blood pressure in the 80s.  I ordered another normal saline bolus and also Rocephin .  I examined the patient and patient moans and is tender on her abdomen on my exam.  She is also asleep persistently tachycardic.  Consider PE versus intra-abdominal process.  Will do CTA chest to rule out PE and CT abdomen pelvis.  8 pm Patient is unable to tolerate CT scan.  I performed conscious sedation with ketamine and went with her to CT scan.   10:11 PM Patient is  still tachycardic with heart rate in the 120s.  CTA chest unremarkable CT abdomen pelvis unremarkable.  Patient also still agitated.  At this point I do not think she is medically cleared.  Likely source is UTI and I sent off urine culture.  I think patient would benefit from being in the hospital for observation regarding her tachycardia.  Patient will still need psych consult inpatient.  Problems Addressed: Hypotension, unspecified hypotension type: acute illness or injury Tachycardia: acute illness or injury  Amount and/or Complexity of Data Reviewed Labs: ordered. Decision-making details documented in ED Course. Radiology: ordered and independent interpretation performed. Decision-making details documented in ED Course. ECG/medicine tests: ordered and independent interpretation performed. Decision-making details documented in ED Course.  Risk OTC drugs. Prescription drug management. Decision regarding hospitalization.   CRITICAL CARE Performed by: Florette Hurry   Total critical care time: 39 minutes  Critical care time was exclusive of separately billable procedures and treating other patients.  Critical care was necessary to treat or prevent imminent or life-threatening deterioration.  Critical care was time spent personally by me on the following activities: development of treatment plan with patient and/or surrogate as well as nursing, discussions with consultants, evaluation of patient's response to treatment, examination of patient, obtaining history from patient or surrogate, ordering and performing treatments and interventions, ordering and  review of laboratory studies, ordering and review of radiographic studies, pulse oximetry and re-evaluation of patient's condition.       Dalene Duck, MD 09/01/23 2214

## 2023-09-01 NOTE — ED Notes (Signed)
 Pt refused all scheduled meds. Antibiotic mixed in water is still at the bedside.

## 2023-09-01 NOTE — Consult Note (Signed)
 [facility] Psychiatric Consult Initial  Patient Name: .Rose [facility]  MRN: [id number]  DOB: 10-May-1960  Consult Order details:  Orders (From admission, onward)     Start     Ordered   09/01/23 1440  CONSULT TO CALL ACT TEAM       Ordering Provider: Lind Repine, MD  Provider:  (Not yet assigned)  Question:  Reason for Consult?  Answer:  Psych consult   09/01/23 1440             Mode of Visit: In person    Psychiatry Consult Evaluation  Service Date: Sep 01, 2023 LOS:  LOS: 0 days  Chief Complaint Agitation, Aggression, AMS  Primary Psychiatric Diagnoses  Altered Mental status 2.  Agitation due other Medical condition.  Assessment  [facility] is a 63 y.o. female admitted: Presented to the EDfor 09/01/2023  1:01 PM for Agitation, Aggression, AMS. She carries the psychiatric diagnoses of Bipolar disorder, MDD, Personality disorder, Agoraphobia and Schizophrenia. and has a past medical history of  Chronic pain, AKI, Epilepsy, Osteogenesis imperfecta, Pelvic Fracture.   Her current presentation of agitation and aggression is most consistent with UTI/ Depression.   She meets criteria for inpatient Psychiatry hospitalization based on symptoms exhibited.  Current outpatient psychotropic medications include Cymbalta  and historically she has had a unknown response to these medications. She was unknown compliant compliant with medications prior to admission as evidenced by documentation from facility. On initial examination, patient was irritable, angry, restless and talking with pressured and rapid speech. Please see plan below for detailed recommendations.   Diagnoses:  Active Hospital problems: Principal Problem:   AMS (altered mental status) Active Problems:   Agitation    Plan   ## Psychiatric Medication Recommendations:  Resume Cymbalta  30 mg bid for depression Start Seroquel 12.5 mg for mood and agitation- ## Medical Decision Making Capacity: Not specifically  addressed in this encounter  ## Further Work-up:   -- most recent EKG on 09/01/2023 had QtC of 452 -- Pertinent labwork reviewed earlier this admission includes: CBC, CMP, UA, UDS   ## Disposition:-- We recommend inpatient psychiatric hospitalization after medical hospitalization. Patient has been involuntarily committed on 09/01/2023.   ## Behavioral / Environmental: -To minimize splitting of staff, assign one staff person to communicate all information from the team when feasible. or Utilize compassion and acknowledge the patient's experiences while setting clear and realistic expectations for care.    ## Safety and Observation Level:  - Based on my clinical evaluation, I estimate the patient to be at No risk of self harm in the current setting. - At this time, we recommend  routine. This decision is based on my review of the chart including patient's history and current presentation, interview of the patient, mental status examination, and consideration of suicide risk including evaluating suicidal ideation, plan, intent, suicidal or self-harm behaviors, risk factors, and protective factors. This judgment is based on our ability to directly address suicide risk, implement suicide prevention strategies, and develop a safety plan while the patient is in the clinical setting. Please contact our team if there is a concern that risk level has changed.  CSSR Risk Category:C-SSRS RISK CATEGORY: No Risk  Suicide Risk Assessment: Patient has following modifiable risk factors for suicide: under treated depression  and medication noncompliance, which we are addressing by recommending inpatient Psychiatry hospitalization.. Patient has following non-modifiable or demographic risk factors for suicide: na Patient has the following protective factors against suicide: Supportive family  Thank you for  this consult request. Recommendations have been communicated to the primary team.  We will continue to  round on patient until we secure a bed in a Geropsychiatry unit at this time.   [facility] C Rose Conaway, NP-PMHNP-BC       History of Present Illness  Relevant Aspects of Hospital ED Course:  Admitted on 09/01/2023 for Agitation, Aggression and AMS  Patient is a 63 years old female brought in EMS from [facility] for agitation and aggression.  Patient has hx of Bipolar disorder, MDD, Personality disorder, Agoraphobia and Schizophrenia.  Patient was IVC by the facility due to mentioned behavior.  Patient was threatening staff, throwing objects at people and threatened to kill her brother.  Patient continued to verbalize threat to her brother accusing her all sorts of things during our interaction.  Patient was given Versed  and Haldol en route to the ER for threatening EMS staff and Police men.  Patient refused to introduce herself to provider and refused to acknowledge her stating it is her business to keep silent.  She later stated that the Police brought her in and that nothing is wrong with her.  Speech is rapid but coherent.  Patient was saying anything that came to her mind.  She denies ever been hospitalizes in a Psychiatry unit, she denies ever suffering from mental illness.  Patient says she is not taking any Psychotropic Medications however list of Medications from the facility includes Cymbalta .  Her urine reveals UTI and she has gotten Recephin injection.  Patient states she does walk slowly off and on but she also has walker and w/c.  Patient is fixed on her brother and what her brother is doing to her.  She could not participate actively during the assessment as she used her time to complain about her brother. Efforts to contact staff at the [facility] facility failed twice as nobody picked up the phone.  No contact number for her brother .  We have resumed her Cymbalta .  Most likely her symptoms are related to UTI and once the infection is cleared she will feel much  better. Provider will contact the facility again.  Psych ROS:  Depression: uta Anxiety:  yes Mania (lifetime and current): yes Psychosis: (lifetime and current): na  Collateral information:  Contacted Rollene Clink ex daughter in law who is the only person assigned to speak to provider on 09/01/2023.  She reported that the facility contacted her to inform her that patient has been agitated, threatening people and throwing stuff around.  She has been cussing using the B and N words at staff members.  She said staff members were unable to redirect her they went and got IVC paper.  She reported that patient spent few days in er early this but was not hospitalized in a Psychiatry unit.  Review of Systems  Reason unable to perform ROS: Unable to participate due to altered Mental status.     Psychiatric and Social History  Psychiatric History:  Information collected from Patient/ chart  Prev Dx/Sx: see Current Psych Provider: uta Home Meds (current): see above Previous Med Trials: uta Therapy: uta  Prior Psych Hospitalization: none  Prior Self Harm: uta Prior Violence: uta  Family Psych History: uta Family Hx suicide: uta  Social History:  Developmental Hx: wnl Educational Hx: collage degree Occupational Hx: Clinical cytogeneticist Hx: na Living Situation: [name] Spiritual Hx: uta Access to weapons/lethal means: uta-none   Substance History Alcohol: Denies  Tobacco:  uta Illicit drugs: denies Prescription drug abuse: denies Rehab hx: uta  Exam Findings  Physical Exam:  Vital Signs:  Temp:  [97.8 F (36.6 C)] 97.8 F (36.6 C) (05/04 1321) Pulse Rate:  [99-127] 99 (05/04 1700) Resp:  [13-19] 16 (05/04 1700) BP: (94-109)/(60-84) 94/80 (05/04 1700) SpO2:  [96 %-100 %] 98 % (05/04 1700) Blood pressure 94/80, pulse 99, temperature 97.8 F (36.6 C), temperature source Oral, resp. rate 16, SpO2 98%. There is no height or weight on file to  calculate BMI.  Physical Exam  Mental Status Exam: General Appearance: Casual  Orientation:  Other:  to place and situation  Memory:  Immediate;   Fair Recent;   Fair Remote;   Fair  Concentration:  Concentration: Poor and Attention Span: Poor  Recall:  Poor  Attention  Poor  Eye Contact:  Minimal  Speech:  Pressured and rapid, repetition  Language:  Good  Volume:  Increased  Mood: UTA, refused to state mood  Affect:  Labile and angry  Thought Process:  Disorganized  Thought Content:  Illogical, Paranoid Ideation, and Tangential  Suicidal Thoughts:   uta  Homicidal Thoughts:   uta  Judgement:  Poor  Insight:  Lacking  Psychomotor Activity:  Increased and Restlessness  Akathisia:  NA  Fund of Knowledge:  Fair      Assets:  Housing Resilience Social Support  Cognition:  Impaired,  Moderate  ADL's:  Intact  AIMS (if indicated):        Other History   These have been pulled in through the EMR, reviewed, and updated if appropriate.  Family History:  The patient's family history includes Heart attack in her mother; Hypertension in her mother; Lung cancer in her father; Stroke in her brother, mother, and sister.  Medical History: 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[facility])    Metabolic encephalopathy    Osteogenesis imperfecta    Osteogenesis imperfecta    Osteoporosis    Personality disorder (HCC)    PVD (peripheral vascular disease) (HCC)    Stroke [facility])     Surgical History: Past Surgical History:  Procedure Laterality Date   ABDOMINAL HYSTERECTOMY     BIOPSY  07/05/2022   Procedure: BIOPSY;  Surgeon: Normie Becton., MD;  Location: [facility];  Service: Gastroenterology;;   ESOPHAGOGASTRODUODENOSCOPY N/A 07/05/2022   Procedure: ESOPHAGOGASTRODUODENOSCOPY (EGD);  Surgeon: Normie Becton., MD;  Location: [facility];  Service: Gastroenterology;  Laterality: N/A;   EUS N/A 07/05/2022   Procedure: UPPER ENDOSCOPIC ULTRASOUND (EUS) LINEAR;  Surgeon: Normie Becton., MD;  Location: [facility];  Service: Gastroenterology;  Laterality: N/A;   HIP ARTHROPLASTY Left 09/26/2015   Procedure: ARTHROPLASTY BIPOLAR HIP (HEMIARTHROPLASTY);  Surgeon: Saundra Curl, MD;  Location: [facility] OR;  Service: Orthopedics;  Laterality: Left;   ORIF HUMERUS FRACTURE Right 11/06/2016   Procedure: OPEN REDUCTION INTERNAL FIXATION (ORIF) PROXIMAL HUMERUS FRACTURE;  Surgeon: Saundra Curl, MD;  Location: [facility] OR;  Service: Orthopedics;  Laterality: Right;     Medications:   Current Facility-Administered Medications:    aspirin  chewable tablet 81 mg, 81 mg,  Oral, Daily, Lind Repine, MD, 81 mg at 09/01/23 1554   celecoxib (CELEBREX) capsule 50 mg, 50 mg, Oral, Daily, Lind Repine, MD   DULoxetine  (CYMBALTA ) DR capsule 30 mg, 30 mg, Oral, Daily, Lind Repine, MD, 30 mg at 09/01/23 1555   gabapentin  (NEURONTIN ) capsule 300 mg, 300 mg, Oral, TID, Lind Repine, MD, 300 mg at 09/01/23 1554   iohexol  (OMNIPAQUE ) 350 MG/ML injection 100 mL, 100 mL, Intravenous, Once PRN, Dalene Duck, MD   levETIRAcetam  (KEPPRA ) tablet 500 mg, 500 mg, Oral, BID, Lind Repine, MD   oxyCODONE  (Oxy IR/ROXICODONE ) immediate release tablet 5 mg, 5 mg, Oral, Q6H PRN, Lind Repine, MD   pantoprazole  (PROTONIX ) EC tablet 40 mg, 40 mg, Oral, Daily, Lind Repine, MD, 40 mg at 09/01/23 1555   pregabalin  (LYRICA ) capsule 100 mg, 100 mg, Oral, TID, Lind Repine, MD, 100 mg at 09/01/23 1555  Current Outpatient Medications:    acetaminophen  (TYLENOL ) 500 MG tablet, Take 1,000 mg by mouth 3 (three) times daily., Disp: , Rfl:     aspirin  81 MG chewable tablet, Chew 81 mg by mouth daily., Disp: , Rfl:    BIOFREEZE COOL THE PAIN 4 % GEL, Apply 1 application  topically See admin instructions. Apply to both hands and knees 2 times a day, Disp: , Rfl:    bisacodyl  (DULCOLAX) 10 MG suppository, Place 10 mg rectally daily as needed (for constipation)., Disp: , Rfl:    calcitonin, salmon, (MIACALCIN /FORTICAL) 200 UNIT/ACT nasal spray, Place 1 spray into alternate nostrils daily. (Patient taking differently: 1 spray See admin instructions. Instill 1 spray into the right nostril once a day- per [facility]), Disp: 3.7 mL, Rfl: 12   calcium  carbonate (TUMS - DOSED IN MG ELEMENTAL CALCIUM ) 500 MG chewable tablet, Chew 1 tablet by mouth 2 (two) times daily., Disp: , Rfl:    celecoxib (CELEBREX) 50 MG capsule, Take 50 mg by mouth daily., Disp: , Rfl:    DEPAKOTE ER 250 MG 24 hr tablet, Take 250 mg by mouth at bedtime., Disp: , Rfl:    diclofenac Sodium (VOLTAREN) 1 % GEL, Apply 2 g topically See admin instructions. Apply 2 grams to the right knee 2 times a day, Disp: , Rfl:    DULoxetine  (CYMBALTA ) 30 MG capsule, Take 1 capsule (30 mg total) by mouth daily. (Patient taking differently: Take 30 mg by mouth 2 (two) times daily.), Disp: 30 capsule, Rfl: 0   FIBER PO, Take 0.4 g by mouth 3 (three) times daily between meals., Disp: , Rfl:    gabapentin  (NEURONTIN ) 300 MG capsule, Take 1 capsule (300 mg total) by mouth 3 (three) times daily., Disp: 10 capsule, Rfl: 0   levETIRAcetam  (KEPPRA ) 500 MG tablet, Take 1 tablet (500 mg total) by mouth 2 (two) times daily., Disp: , Rfl:    LORazepam  (ATIVAN ) 0.5 MG tablet, Take 0.5 mg by mouth every 6 (six) hours as needed for anxiety., Disp: , Rfl:    magnesium  oxide (MAG-OX) 400 (240 Mg) MG tablet, Take 400 mg by mouth daily., Disp: , Rfl:    oxyCODONE  (OXY IR/ROXICODONE ) 5 MG immediate release tablet, Take 5 mg by mouth every 8 (eight) hours as needed (for pain)., Disp: , Rfl:    pantoprazole  (PROTONIX ) 40  MG tablet, Take 40 mg by mouth daily before breakfast., Disp: , Rfl:    polyethylene glycol powder (GLYCOLAX /MIRALAX ) 17 GM/SCOOP powder, Take 17 g by mouth daily as needed (to aid the bowels- mix into 4 to 6 ounces of  fluid)., Disp: , Rfl:    senna (SENOKOT) 8.6 MG TABS tablet, Take 1 tablet by mouth daily as needed for mild constipation., Disp: , Rfl:    Vitamin D , Ergocalciferol , (DRISDOL ) 1.25 MG (50000 UNIT) CAPS capsule, Take 1 capsule (50,000 Units total) by mouth every 7 (seven) days. (Patient taking differently: Take 50,000 Units by mouth every Thursday.), Disp: 5 capsule, Rfl:    cephALEXin  (KEFLEX ) 500 MG capsule, Take 1 capsule (500 mg total) by mouth 3 (three) times daily. (Patient not taking: Reported on 05/28/2023), Disp: 20 capsule, Rfl: 0  Allergies: Allergies  Allergen Reactions   Codeine Other (See Comments)    Migraines     Alfreida Inches, NP

## 2023-09-01 NOTE — ED Notes (Signed)
 Pt continues to refuse all oral medications.

## 2023-09-02 ENCOUNTER — Encounter (HOSPITAL_COMMUNITY): Payer: Self-pay | Admitting: Internal Medicine

## 2023-09-02 DIAGNOSIS — B965 Pseudomonas (aeruginosa) (mallei) (pseudomallei) as the cause of diseases classified elsewhere: Secondary | ICD-10-CM | POA: Diagnosis present

## 2023-09-02 DIAGNOSIS — G9341 Metabolic encephalopathy: Secondary | ICD-10-CM | POA: Diagnosis present

## 2023-09-02 DIAGNOSIS — Z96642 Presence of left artificial hip joint: Secondary | ICD-10-CM | POA: Diagnosis present

## 2023-09-02 DIAGNOSIS — E860 Dehydration: Secondary | ICD-10-CM | POA: Diagnosis present

## 2023-09-02 DIAGNOSIS — G40909 Epilepsy, unspecified, not intractable, without status epilepticus: Secondary | ICD-10-CM | POA: Diagnosis present

## 2023-09-02 DIAGNOSIS — F319 Bipolar disorder, unspecified: Secondary | ICD-10-CM | POA: Diagnosis present

## 2023-09-02 DIAGNOSIS — I959 Hypotension, unspecified: Secondary | ICD-10-CM | POA: Diagnosis not present

## 2023-09-02 DIAGNOSIS — Z7982 Long term (current) use of aspirin: Secondary | ICD-10-CM | POA: Diagnosis not present

## 2023-09-02 DIAGNOSIS — M810 Age-related osteoporosis without current pathological fracture: Secondary | ICD-10-CM | POA: Diagnosis present

## 2023-09-02 DIAGNOSIS — Q780 Osteogenesis imperfecta: Secondary | ICD-10-CM | POA: Diagnosis not present

## 2023-09-02 DIAGNOSIS — R9431 Abnormal electrocardiogram [ECG] [EKG]: Secondary | ICD-10-CM | POA: Diagnosis present

## 2023-09-02 DIAGNOSIS — Z90710 Acquired absence of both cervix and uterus: Secondary | ICD-10-CM | POA: Diagnosis not present

## 2023-09-02 DIAGNOSIS — Z87898 Personal history of other specified conditions: Secondary | ICD-10-CM

## 2023-09-02 DIAGNOSIS — I739 Peripheral vascular disease, unspecified: Secondary | ICD-10-CM | POA: Diagnosis present

## 2023-09-02 DIAGNOSIS — E876 Hypokalemia: Secondary | ICD-10-CM | POA: Diagnosis not present

## 2023-09-02 DIAGNOSIS — Z79899 Other long term (current) drug therapy: Secondary | ICD-10-CM | POA: Diagnosis not present

## 2023-09-02 DIAGNOSIS — Z823 Family history of stroke: Secondary | ICD-10-CM | POA: Diagnosis not present

## 2023-09-02 DIAGNOSIS — F209 Schizophrenia, unspecified: Secondary | ICD-10-CM | POA: Diagnosis present

## 2023-09-02 DIAGNOSIS — R000 Tachycardia, unspecified: Secondary | ICD-10-CM | POA: Diagnosis not present

## 2023-09-02 DIAGNOSIS — Z8659 Personal history of other mental and behavioral disorders: Secondary | ICD-10-CM | POA: Diagnosis not present

## 2023-09-02 DIAGNOSIS — K219 Gastro-esophageal reflux disease without esophagitis: Secondary | ICD-10-CM | POA: Diagnosis present

## 2023-09-02 DIAGNOSIS — Z8249 Family history of ischemic heart disease and other diseases of the circulatory system: Secondary | ICD-10-CM | POA: Diagnosis not present

## 2023-09-02 DIAGNOSIS — Z7401 Bed confinement status: Secondary | ICD-10-CM | POA: Diagnosis not present

## 2023-09-02 DIAGNOSIS — Z87891 Personal history of nicotine dependence: Secondary | ICD-10-CM | POA: Diagnosis not present

## 2023-09-02 DIAGNOSIS — I10 Essential (primary) hypertension: Secondary | ICD-10-CM | POA: Diagnosis present

## 2023-09-02 DIAGNOSIS — N3000 Acute cystitis without hematuria: Secondary | ICD-10-CM | POA: Diagnosis not present

## 2023-09-02 DIAGNOSIS — Z8673 Personal history of transient ischemic attack (TIA), and cerebral infarction without residual deficits: Secondary | ICD-10-CM | POA: Diagnosis not present

## 2023-09-02 DIAGNOSIS — N19 Unspecified kidney failure: Secondary | ICD-10-CM | POA: Diagnosis present

## 2023-09-02 DIAGNOSIS — E785 Hyperlipidemia, unspecified: Secondary | ICD-10-CM | POA: Diagnosis present

## 2023-09-02 LAB — COMPREHENSIVE METABOLIC PANEL WITH GFR
ALT: 17 U/L (ref 0–44)
AST: 35 U/L (ref 15–41)
Albumin: 2.4 g/dL — ABNORMAL LOW (ref 3.5–5.0)
Alkaline Phosphatase: 71 U/L (ref 38–126)
Anion gap: 12 (ref 5–15)
BUN: 7 mg/dL — ABNORMAL LOW (ref 8–23)
CO2: 21 mmol/L — ABNORMAL LOW (ref 22–32)
Calcium: 8 mg/dL — ABNORMAL LOW (ref 8.9–10.3)
Chloride: 107 mmol/L (ref 98–111)
Creatinine, Ser: 0.47 mg/dL (ref 0.44–1.00)
GFR, Estimated: >60 mL/min (ref >60)
Glucose, Bld: 94 mg/dL (ref 70–99)
Potassium: 3.2 mmol/L — ABNORMAL LOW (ref 3.5–5.1)
Sodium: 140 mmol/L (ref 135–145)
Total Bilirubin: 0.8 mg/dL (ref 0.0–1.2)
Total Protein: 5.5 g/dL — ABNORMAL LOW (ref 6.5–8.1)

## 2023-09-02 LAB — CBC WITH DIFFERENTIAL/PLATELET
Abs Immature Granulocytes: 0.01 10*3/uL (ref 0.00–0.07)
Basophils Absolute: 0 10*3/uL (ref 0.0–0.1)
Basophils Relative: 0 %
Eosinophils Absolute: 0.1 10*3/uL (ref 0.0–0.5)
Eosinophils Relative: 1 %
HCT: 43.4 % (ref 36.0–46.0)
Hemoglobin: 13.8 g/dL (ref 12.0–15.0)
Immature Granulocytes: 0 %
Lymphocytes Relative: 32 %
Lymphs Abs: 2.5 10*3/uL (ref 0.7–4.0)
MCH: 35.3 pg — ABNORMAL HIGH (ref 26.0–34.0)
MCHC: 31.8 g/dL (ref 30.0–36.0)
MCV: 111 fL — ABNORMAL HIGH (ref 80.0–100.0)
Monocytes Absolute: 0.7 10*3/uL (ref 0.1–1.0)
Monocytes Relative: 9 %
Neutro Abs: 4.5 10*3/uL (ref 1.7–7.7)
Neutrophils Relative %: 58 %
Platelets: 200 10*3/uL (ref 150–400)
RBC: 3.91 MIL/uL (ref 3.87–5.11)
RDW: 13.5 % (ref 11.5–15.5)
WBC: 7.7 10*3/uL (ref 4.0–10.5)
nRBC: 0 % (ref 0.0–0.2)

## 2023-09-02 LAB — PROTIME-INR
INR: 1 (ref 0.8–1.2)
Prothrombin Time: 12.9 s (ref 11.4–15.2)

## 2023-09-02 LAB — VITAMIN B12: Vitamin B-12: 1419 pg/mL — ABNORMAL HIGH (ref 180–914)

## 2023-09-02 LAB — CK: Total CK: 312 U/L — ABNORMAL HIGH (ref 38–234)

## 2023-09-02 LAB — TSH: TSH: 0.417 u[IU]/mL (ref 0.350–4.500)

## 2023-09-02 MED ORDER — SODIUM CHLORIDE 0.9 % IV SOLN: INTRAVENOUS | Status: DC

## 2023-09-02 MED ORDER — POTASSIUM CHLORIDE CRYS ER 20 MEQ PO TBCR
40.0000 meq | EXTENDED_RELEASE_TABLET | Freq: Once | ORAL | Status: DC
  Filled 2023-09-02: qty 2

## 2023-09-02 MED ORDER — MAGNESIUM SULFATE 2 GM/50ML IV SOLN
2.0000 g | Freq: Once | INTRAVENOUS | Status: AC
  Administered 2023-09-02: 2 g via INTRAVENOUS
  Filled 2023-09-02: qty 50

## 2023-09-02 NOTE — ED Notes (Signed)
 ED TO INPATIENT HANDOFF REPORT  Name/Age/Gender Rose Bridges 63 y.o. female  Code Status    Code Status Orders  (From admission, onward)           Start     Ordered   09/01/23 2232  Full code  Continuous       Question:  By:  Answer:  Consent: discussion documented in EHR   09/01/23 2231           Code Status History     Date Active Date Inactive Code Status Order ID Comments User Context   09/01/2023 1440 09/01/2023 2231 Full Code [id number]  Lind Repine, MD ED   05/28/2023 2109 06/05/2023 2340 Full Code [id number]  Roberts Ching, MD ED   04/07/2023 1549 04/11/2023 1717 Full Code [id number]  Walton Guppy, MD ED   05/22/2022 1057 05/25/2022 2039 Full Code [id number]  Lorita Rosa, MD Inpatient   05/10/2022 2019 05/19/2022 0138 Full Code [id number]  Bary Boss, DO ED   04/03/2022 2349 04/08/2022 0020 Full Code [id number]  Jonne Netters, MD ED   04/19/2018 1713 05/03/2018 0042 Full Code [id number]  Colin Dawley, MD ED   11/05/2016 2331 11/09/2016 2048 Full Code [id number]  Dea Evert, DO ED   09/25/2015 1010 09/29/2015 1552 Full Code [id number]  Roselle Conner, MD ED       Home/SNF/Other Skilled nursing facility  Chief Complaint Acute cystitis [N30.00]  Level of Care/Admitting Diagnosis ED Disposition     ED Disposition  Admit   Condition  --   Comment  Hospital Area: [facility] [[id number]]  Level of Care: Progressive [102]  Admit to Progressive based on following criteria: MULTISYSTEM THREATS such as stable sepsis, metabolic/electrolyte imbalance with or without encephalopathy that is responding to early treatment.  May place patient in observation at [facility] or [facility] if equivalent level of care is available:: No  Covid Evaluation: Asymptomatic - no recent exposure (last 10 days) testing not required  Diagnosis: Acute cystitis [595.0.ICD-9-CM]  Admitting Physician: HOWERTER, JUSTIN B [[id number]]  Attending Physician:  HOWERTER, JUSTIN B [[id number]]          Medical History 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[facility])    Metabolic encephalopathy    Osteogenesis imperfecta    Osteogenesis imperfecta    Osteoporosis    Personality disorder (HCC)    PVD (peripheral vascular disease) (HCC)    Stroke (HCC)     Allergies Allergies  Allergen Reactions   Codeine Other (See Comments)    Migraines     IV Location/Drains/Wounds Patient Lines/Drains/Airways Status     Active Line/Drains/Airways     Name Placement date Placement time Site Days   Peripheral IV 09/01/23 20 G 1" Anterior;Left Forearm 09/01/23  1432  Forearm  1   Peripheral IV 09/01/23 18 G 1.88" Anterior;Left;Upper Arm 09/01/23  1800  Arm  1            Labs/Imaging Results for orders placed or performed during the hospital encounter of 09/01/23 (from the past 48 hours)  Ethanol  Status: None   Collection Time: 09/01/23  1:23 PM  Result Value Ref Range   Alcohol, Ethyl (B) <15 <15 mg/dL    Comment: Please note change in reference range. (NOTE) For medical purposes only. Performed at [facility], [address], [place], [place] [address]   CBC with Differential/Platelet     Status: Abnormal   Collection Time: 09/01/23  1:23 PM  Result Value Ref Range   WBC 9.9 4.0 - 10.5 K/uL   RBC 4.13 3.87 - 5.11 MIL/uL   Hemoglobin 14.7 12.0 - 15.0 g/dL   HCT 78.2 95.6 - 21.3 %   MCV 105.1 (H) 80.0 - 100.0 fL   MCH 35.6 (H) 26.0 - 34.0 pg   MCHC 33.9 30.0 - 36.0 g/dL   RDW 08.6 57.8 - 46.9 %   Platelets 239 150 - 400 K/uL   nRBC 0.0 0.0 - 0.2 %   Neutrophils  Relative % 66 %   Neutro Abs 6.5 1.7 - 7.7 K/uL   Lymphocytes Relative 24 %   Lymphs Abs 2.4 0.7 - 4.0 K/uL   Monocytes Relative 9 %   Monocytes Absolute 0.9 0.1 - 1.0 K/uL   Eosinophils Relative 1 %   Eosinophils Absolute 0.1 0.0 - 0.5 K/uL   Basophils Relative 0 %   Basophils Absolute 0.0 0.0 - 0.1 K/uL   Immature Granulocytes 0 %   Abs Immature Granulocytes 0.03 0.00 - 0.07 K/uL    Comment: Performed at [facility], [address], [place], [place] [address]  Comprehensive metabolic panel with GFR     Status: Abnormal   Collection Time: 09/01/23  1:23 PM  Result Value Ref Range   Sodium 138 135 - 145 mmol/L   Potassium 3.5 3.5 - 5.1 mmol/L   Chloride 105 98 - 111 mmol/L   CO2 23 22 - 32 mmol/L   Glucose, Bld 78 70 - 99 mg/dL    Comment: Glucose reference range applies only to samples taken after fasting for at least 8 hours.   BUN 11 8 - 23 mg/dL   Creatinine, Ser 8.41 0.44 - 1.00 mg/dL   Calcium  7.9 (L) 8.9 - 10.3 mg/dL   Total Protein 5.1 (L) 6.5 - 8.1 g/dL   Albumin 2.3 (L) 3.5 - 5.0 g/dL   AST 33 15 - 41 U/L   ALT 16 0 - 44 U/L   Alkaline Phosphatase 69 38 - 126 U/L   Total Bilirubin 1.2 0.0 - 1.2 mg/dL   GFR, Estimated >32 >44 mL/min    Comment: (NOTE) Calculated using the CKD-EPI Creatinine Equation (2021)    Anion gap 10 5 - 15    Comment: Performed at [facility], [address] [place], [place] [address]  Magnesium      Status: Abnormal   Collection Time: 09/01/23  1:23 PM  Result Value Ref Range   Magnesium  1.3 (L) 1.7 - 2.4 mg/dL    Comment: Performed at [facility], [address], [place], [place] [address]  Rapid urine drug screen (hospital performed)     Status: Abnormal   Collection Time: 09/01/23  2:03 PM  Result Value Ref Range   Opiates NONE DETECTED NONE DETECTED   Cocaine NONE DETECTED NONE DETECTED   Benzodiazepines POSITIVE (A) NONE DETECTED   Amphetamines NONE DETECTED NONE DETECTED    Tetrahydrocannabinol NONE DETECTED NONE DETECTED   Barbiturates NONE DETECTED NONE DETECTED    Comment: (NOTE) DRUG SCREEN FOR  MEDICAL PURPOSES ONLY.  IF CONFIRMATION IS NEEDED FOR ANY PURPOSE, NOTIFY LAB WITHIN 5 DAYS.  LOWEST DETECTABLE LIMITS FOR URINE DRUG SCREEN Drug Class                     Cutoff (ng/mL) Amphetamine and metabolites    1000 Barbiturate and metabolites    200 Benzodiazepine                 200 Opiates and metabolites        300 Cocaine and metabolites        300 THC                            50 Performed at [facility], [address] [place], [place] [address]   Urinalysis, w/ Reflex to Culture (Infection Suspected) -Urine, Clean Catch     Status: Abnormal   Collection Time: 09/01/23  2:03 PM  Result Value Ref Range   Specimen Source URINE, CLEAN CATCH    Color, Urine AMBER (A) YELLOW    Comment: BIOCHEMICALS MAY BE AFFECTED BY COLOR   APPearance CLOUDY (A) CLEAR   Specific Gravity, Urine 1.028 1.005 - 1.030   pH 5.0 5.0 - 8.0   Glucose, UA NEGATIVE NEGATIVE mg/dL   Hgb urine dipstick MODERATE (A) NEGATIVE   Bilirubin Urine SMALL (A) NEGATIVE   Ketones, ur 20 (A) NEGATIVE mg/dL   Protein, ur 30 (A) NEGATIVE mg/dL   Nitrite NEGATIVE NEGATIVE   Leukocytes,Ua MODERATE (A) NEGATIVE   RBC / HPF 21-50 0 - 5 RBC/hpf   WBC, UA >50 0 - 5 WBC/hpf    Comment:        Reflex urine culture not performed if WBC <=10, OR if Squamous epithelial cells >5. If Squamous epithelial cells >5 suggest recollection.    Bacteria, UA RARE (A) NONE SEEN   Squamous Epithelial / HPF 21-50 0 - 5 /HPF   Mucus PRESENT    Budding Yeast PRESENT     Comment: Performed at [facility], [address], [place], [place] [address]  I-Stat CG4 Lactic Acid     Status: None   Collection Time: 09/01/23  9:39 PM  Result Value Ref Range   Lactic Acid, Venous 1.9 0.5 - 1.9 mmol/L   CT Angio Chest PE W and/or Wo Contrast Result Date:  09/01/2023 CLINICAL DATA:  Concern for pulmonary embolism. Also abdominal pain. EXAM: CT ANGIOGRAPHY CHEST CT ABDOMEN AND PELVIS WITH CONTRAST TECHNIQUE: Multidetector CT imaging of the chest was performed using the standard protocol during bolus administration of intravenous contrast. Multiplanar CT image reconstructions and MIPs were obtained to evaluate the vascular anatomy. Multidetector CT imaging of the abdomen and pelvis was performed using the standard protocol during bolus administration of intravenous contrast. RADIATION DOSE REDUCTION: This exam was performed according to the departmental dose-optimization program which includes automated exposure control, adjustment of the mA and/or kV according to patient size and/or use of iterative reconstruction technique. CONTRAST:  OMNIPAQUE  IOHEXOL  350 MG/ML SOLN COMPARISON:  Chest radiograph dated 05/17/2023. FINDINGS: Evaluation of this exam is limited due to respiratory motion. CTA CHEST FINDINGS Cardiovascular: There is no cardiomegaly. Trace pericardial effusion. Three-vessel coronary vascular calcification. The thoracic aorta is unremarkable. The origins of the great vessels of the aortic arch are patent. No pulmonary artery embolus identified. Mediastinum/Nodes: No hilar or mediastinal adenopathy. The esophagus is grossly unremarkable no mediastinal fluid  collection. No mediastinal fluid collection. Lungs/Pleura: Bilateral lower lobe linear atelectasis/scarring. No focal consolidation, pleural effusion, or pneumothorax. The central airways are patent. Musculoskeletal: Osteopenia.  No acute osseous pathology. Review of the MIP images confirms the above findings. CT ABDOMEN and PELVIS FINDINGS No intra-abdominal free air or free fluid. Hepatobiliary: The liver is unremarkable. Mild dilatation. No calcified gallstone. Focal adenomyomatosis of the gallbladder fundus. Pancreas: Unremarkable. No pancreatic ductal dilatation or surrounding inflammatory  changes. Spleen: Normal in size without focal abnormality. Adrenals/Urinary Tract: The adrenal glands unremarkable. There is no hydronephrosis on either side. The visualized ureters and urinary bladder appear unremarkable. Stomach/Bowel: There is normal obstruction. The appendix is normal. Several duodenal diverticula measure up to 4 cm. Vascular/Lymphatic: Mild aortoiliac atherosclerotic disease. The IVC is unremarkable no portal gas. There is no adenopathy. Reproductive: Hysterectomy. Other: None Musculoskeletal: Left hip arthroplasty with associated streak artifact. Osteopenia with degenerative changes of the spine. No acute osseous pathology. Review of the MIP images confirms the above findings. IMPRESSION: 1. No acute intrathoracic, abdominal, or pelvic pathology. No CT evidence of pulmonary artery embolus. 2. Three-vessel coronary vascular calcification. 3.  Aortic Atherosclerosis (ICD10-I70.0). Electronically Signed   By: Angus Bark M.D.   On: 09/01/2023 20:49   CT ABDOMEN PELVIS W CONTRAST Result Date: 09/01/2023 CLINICAL DATA:  Concern for pulmonary embolism. Also abdominal pain. EXAM: CT ANGIOGRAPHY CHEST CT ABDOMEN AND PELVIS WITH CONTRAST TECHNIQUE: Multidetector CT imaging of the chest was performed using the standard protocol during bolus administration of intravenous contrast. Multiplanar CT image reconstructions and MIPs were obtained to evaluate the vascular anatomy. Multidetector CT imaging of the abdomen and pelvis was performed using the standard protocol during bolus administration of intravenous contrast. RADIATION DOSE REDUCTION: This exam was performed according to the departmental dose-optimization program which includes automated exposure control, adjustment of the mA and/or kV according to patient size and/or use of iterative reconstruction technique. CONTRAST:  OMNIPAQUE  IOHEXOL  350 MG/ML SOLN COMPARISON:  Chest radiograph dated 05/17/2023. FINDINGS: Evaluation of this exam  is limited due to respiratory motion. CTA CHEST FINDINGS Cardiovascular: There is no cardiomegaly. Trace pericardial effusion. Three-vessel coronary vascular calcification. The thoracic aorta is unremarkable. The origins of the great vessels of the aortic arch are patent. No pulmonary artery embolus identified. Mediastinum/Nodes: No hilar or mediastinal adenopathy. The esophagus is grossly unremarkable no mediastinal fluid collection. No mediastinal fluid collection. Lungs/Pleura: Bilateral lower lobe linear atelectasis/scarring. No focal consolidation, pleural effusion, or pneumothorax. The central airways are patent. Musculoskeletal: Osteopenia.  No acute osseous pathology. Review of the MIP images confirms the above findings. CT ABDOMEN and PELVIS FINDINGS No intra-abdominal free air or free fluid. Hepatobiliary: The liver is unremarkable. Mild dilatation. No calcified gallstone. Focal adenomyomatosis of the gallbladder fundus. Pancreas: Unremarkable. No pancreatic ductal dilatation or surrounding inflammatory changes. Spleen: Normal in size without focal abnormality. Adrenals/Urinary Tract: The adrenal glands unremarkable. There is no hydronephrosis on either side. The visualized ureters and urinary bladder appear unremarkable. Stomach/Bowel: There is normal obstruction. The appendix is normal. Several duodenal diverticula measure up to 4 cm. Vascular/Lymphatic: Mild aortoiliac atherosclerotic disease. The IVC is unremarkable no portal gas. There is no adenopathy. Reproductive: Hysterectomy. Other: None Musculoskeletal: Left hip arthroplasty with associated streak artifact. Osteopenia with degenerative changes of the spine. No acute osseous pathology. Review of the MIP images confirms the above findings. IMPRESSION: 1. No acute intrathoracic, abdominal, or pelvic pathology. No CT evidence of pulmonary artery embolus. 2. Three-vessel coronary vascular calcification. 3.  Aortic Atherosclerosis (ICD10-I70.0).  Electronically Signed   By: Angus Bark M.D.   On: 09/01/2023 20:49   CT HEAD WO CONTRAST ( ) Result Date: 09/01/2023 CLINICAL DATA:  Altered mental status EXAM: CT HEAD WITHOUT CONTRAST TECHNIQUE: Contiguous axial images were obtained from the base of the skull through the vertex without intravenous contrast. RADIATION DOSE REDUCTION: This exam was performed according to the departmental dose-optimization program which includes automated exposure control, adjustment of the mA and/or kV according to patient size and/or use of iterative reconstruction technique. COMPARISON:  05/28/2023 FINDINGS: Brain: No evidence of acute infarction, hemorrhage, hydrocephalus, extra-axial collection or mass lesion/mass effect. Mild chronic white matter ischemic changes are again noted. 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 Vascular: No hyperdense vessel or unexpected calcification. Skull: Normal. Negative for fracture or focal lesion. Sinuses/Orbits: No acute finding. Other: None. IMPRESSION: Chronic changes without acute abnormality. Electronically Signed   By: Violeta Grey M.D.   On: 09/01/2023 20:39    Pending Labs Unresulted Labs (From admission, onward)     Start     Ordered   09/02/23 0500  CBC with Differential/Platelet  Tomorrow morning,   R        09/01/23 2232   09/02/23 0500  Comprehensive metabolic panel with GFR  Tomorrow morning,   R        09/01/23 2232   09/01/23 1927  Blood culture (routine x 2)  BLOOD CULTURE X 2,   R      09/01/23 1926   09/01/23 1611  Urine Culture  Add-on,   AD       Question:  Indication  Answer:  Dysuria   09/01/23 1610            Vitals/Pain Today's Vitals   09/01/23 2035 09/01/23 2215 09/01/23 2245 09/01/23 2300  BP: 114/76 98/68 101/66 111/84  Pulse: (!) 114 (!) 103 (!) 101 (!) 115  Resp: 20 17 14 15   Temp:      TempSrc:      SpO2: 100% 100%  100% 100%  Weight:        Isolation Precautions No active isolations  Medications Medications  fosfomycin (MONUROL ) packet 3 g (3 g Oral Patient Refused/Not Given 09/01/23 1554)  pantoprazole  (PROTONIX ) EC tablet 40 mg (40 mg Oral Patient Refused/Not Given 09/01/23 1555)  oxyCODONE  (Oxy IR/ROXICODONE ) immediate release tablet 5 mg (has no administration in time range)  levETIRAcetam  (KEPPRA ) tablet 500 mg (500 mg Oral Given 09/01/23 2043)  gabapentin  (NEURONTIN ) capsule 300 mg (300 mg Oral Not Given 09/01/23 2252)  aspirin  chewable tablet 81 mg (81 mg Oral Patient Refused/Not Given 09/01/23 1554)  DULoxetine  (CYMBALTA ) DR capsule 30 mg (30 mg Oral Given by Other 09/01/23 2042)  QUEtiapine (SEROQUEL) tablet 12.5 mg (12.5 mg Oral Given 09/01/23 2039)  ziprasidone (GEODON) injection 20 mg (has no administration in time range)  acetaminophen  (TYLENOL ) tablet 650 mg (has no administration in time range)    Or  acetaminophen  (TYLENOL ) suppository 650 mg (has no administration in time range)  ondansetron  (ZOFRAN ) injection 4 mg (has no administration in time range)  0.9 %  sodium chloride  infusion (has no administration in time range)  cefTRIAXone  (ROCEPHIN ) 1 g in sodium chloride  0.9 % 100 mL IVPB (has no administration in time range)  haloperidol lactate (HALDOL) injection 5 mg (5 mg Intramuscular Given 09/01/23 1410)  sodium chloride  0.9 % bolus 1,000 mL (0  mLs Intravenous Stopped 09/01/23 1854)  cefTRIAXone  (ROCEPHIN ) 1 g in sodium chloride  0.9 % 100 mL IVPB (0 g Intravenous Stopped 09/01/23 1855)  iohexol  (OMNIPAQUE ) 350 MG/ML injection 100 mL (100 mLs Intravenous Contrast Given 09/01/23 2003)  ketamine 50 mg in normal saline 5 mL (10 mg/mL) syringe (81 mg Intravenous Given 09/01/23 2000)  sodium chloride  0.9 % bolus 1,000 mL (1,000 mLs Intravenous New Bag/Given 09/01/23 2113)    Mobility non-ambulatory

## 2023-09-02 NOTE — Plan of Care (Signed)

## 2023-09-02 NOTE — Progress Notes (Signed)
 PROGRESS NOTE    Rose Bridges  WUJ:[id number] DOB: 12/07/1960 DOA: 09/01/2023 PCP: Diona Franklin    Brief Narrative:  Rose Bridges is a 63 y.o. female with medical history significant for bipolar disorder, seizure disorder, who is admitted to [facility] on 09/01/2023 with acute cystitis complicated by acute metabolic encephalopathy after presenting from inpatient psych facility to [facility] ED for evaluation of agitation.  Per chart review appears patient is a skilled nursing facility resident.  Patient is unable to provide any kind of collateral information   Assessment and Plan:    Acute cystitis causing AMS -IV abx -urine culture      dehydration -IVF      hypokalemia/hypomagnesemia -replete    QTc prolongation: -monitor on tele  -replace K and Mg     Bipolar disorder:  -psych consult     History of seizures -without clinical evidence to suggest active seizures at this time -keppra       GERD: -PPI     Patient appears to be from [facility], unsure if she is a long-term patient there.  Unsure if patient walks, she says she does.  Will get PT evaluation as suspect she will need to be ambulatory for psych placement.  Currently patient is IVC and recommendations are for inpatient psych     DVT prophylaxis: SCDs Start: 09/01/23 2232    Code Status: Full Code   Disposition Plan:  Level of care: Progressive Status is: Observation     Consultants:  psych   Subjective: No SOB, no CP-- says she walks normally  Objective: Vitals:   09/01/23 2300 09/02/23 0110 09/02/23 0603 09/02/23 1025  BP: 111/84 94/78 120/73 (!) 98/55  Pulse: (!) 115 99 (!) 109 86  Resp: 15 19 16 16   Temp:  97.7 F (36.5 C) 98.6 F (37 C) 98.3 F (36.8 C)  TempSrc:  Oral  Oral  SpO2: 100% 99% 96% 96%  Weight:  53.8 kg    Height:  5\' 4"  (1.626 m)      Intake/Output Summary (Last 24 hours) at 09/02/2023 1104 Last data filed at 09/02/2023 0640 Gross per 24 hour   Intake 1451.54 ml  Output --  Net 1451.54 ml   Filed Weights   09/01/23 1910 09/02/23 0110  Weight: 54 kg 53.8 kg    Examination:   General: Appearance:    Weak appearing female in no acute distress     Lungs:     respirations unlabored  Heart:    Normal heart rate.    MS:   All extremities are intact.    Neurologic:   Awake but confused       Data Reviewed: I have personally reviewed following labs and imaging studies  CBC: Recent Labs  Lab 09/01/23 1323 09/02/23 0341  WBC 9.9 7.7  NEUTROABS 6.5 4.5  HGB 14.7 13.8  HCT 43.4 43.4  MCV 105.1* 111.0*  PLT 239 200   Basic Metabolic Panel: Recent Labs  Lab 09/01/23 1323 09/02/23 0341  NA 138 140  K 3.5 3.2*  CL 105 107  CO2 23 21*  GLUCOSE 78 94  BUN 11 7*  CREATININE 0.50 0.47  CALCIUM  7.9* 8.0*  MG 1.3*  --    GFR: Estimated Creatinine Clearance: 60.3 mL/min (by C-G formula based on SCr of 0.47 mg/dL). Liver Function Tests: Recent Labs  Lab 09/01/23 1323 09/02/23 0341  AST 33 35  ALT 16 17  ALKPHOS 69 71  BILITOT 1.2 0.8  PROT  5.1* 5.5*  ALBUMIN 2.3* 2.4*   No results for input(s): "LIPASE", "AMYLASE" in the last 168 hours. No results for input(s): "AMMONIA" in the last 168 hours. Coagulation Profile: Recent Labs  Lab 09/02/23 0725  INR 1.0   Cardiac Enzymes: Recent Labs  Lab 09/02/23 0725  CKTOTAL 312*   BNP (last 3 results) No results for input(s): "PROBNP" in the last 8760 hours. HbA1C: No results for input(s): "HGBA1C" in the last 72 hours. CBG: No results for input(s): "GLUCAP" in the last 168 hours. Lipid Profile: No results for input(s): "CHOL", "HDL", "LDLCALC", "TRIG", "CHOLHDL", "LDLDIRECT" in the last 72 hours. Thyroid  Function Tests: Recent Labs    09/02/23 0725  TSH 0.417   Anemia Panel: Recent Labs    09/02/23 0725  VITAMINB12 1,419*   Sepsis Labs: Recent Labs  Lab 09/01/23 2139  LATICACIDVEN 1.9    Recent Results (from the past 240 hours)   Blood culture (routine x 2)     Status: None (Preliminary result)   Collection Time: 09/01/23  9:33 PM   Specimen: BLOOD  Result Value Ref Range Status   Specimen Description   Final    BLOOD BLOOD LEFT FOREARM Performed at [facility], [address], [place], [place] [address]    Special Requests   Final    BOTTLES DRAWN AEROBIC AND ANAEROBIC Blood Culture adequate volume Performed at [facility], [address], [place], [place] [address]    Culture   Final    NO GROWTH < 12 HOURS Performed at [facility], [address] [place], [place] [address]    Report Status PENDING  Incomplete  Blood culture (routine x 2)     Status: None (Preliminary result)   Collection Time: 09/02/23  3:41 AM   Specimen: BLOOD RIGHT ARM  Result Value Ref Range Status   Specimen Description   Final    BLOOD RIGHT ARM Performed at [facility], [address] [place], [place] [address]    Special Requests   Final    BOTTLES DRAWN AEROBIC ONLY Blood Culture results may not be optimal due to an inadequate volume of blood received in culture bottles Performed at [facility], [address] [place] [employer] [place], [place] [address]    Culture   Final    NO GROWTH < 12 HOURS Performed at [facility], [address] [place], [place] [address]    Report Status PENDING  Incomplete         Radiology Studies: CT Angio Chest PE W and/or Wo Contrast Result Date: 09/01/2023 CLINICAL DATA:  Concern for pulmonary embolism. Also abdominal pain. EXAM: CT ANGIOGRAPHY CHEST CT ABDOMEN AND PELVIS WITH CONTRAST TECHNIQUE: Multidetector CT imaging of the chest was performed using the standard protocol during bolus administration of intravenous contrast. Multiplanar CT image reconstructions and MIPs were obtained to evaluate the vascular anatomy. Multidetector CT imaging of the abdomen and pelvis was performed using the standard protocol during bolus  administration of intravenous contrast. RADIATION DOSE REDUCTION: This exam was performed according to the departmental dose-optimization program which includes automated exposure control, adjustment of the mA and/or kV according to patient size and/or use of iterative reconstruction technique. CONTRAST:  OMNIPAQUE  IOHEXOL  350 MG/ML SOLN COMPARISON:  Chest radiograph dated 05/17/2023. FINDINGS: Evaluation of this exam is limited due to respiratory motion. CTA CHEST FINDINGS Cardiovascular: There is no cardiomegaly. Trace pericardial effusion. Three-vessel coronary vascular calcification. The thoracic aorta  is unremarkable. The origins of the great vessels of the aortic arch are patent. No pulmonary artery embolus identified. Mediastinum/Nodes: No hilar or mediastinal adenopathy. The esophagus is grossly unremarkable no mediastinal fluid collection. No mediastinal fluid collection. Lungs/Pleura: Bilateral lower lobe linear atelectasis/scarring. No focal consolidation, pleural effusion, or pneumothorax. The central airways are patent. Musculoskeletal: Osteopenia.  No acute osseous pathology. Review of the MIP images confirms the above findings. CT ABDOMEN and PELVIS FINDINGS No intra-abdominal free air or free fluid. Hepatobiliary: The liver is unremarkable. Mild dilatation. No calcified gallstone. Focal adenomyomatosis of the gallbladder fundus. Pancreas: Unremarkable. No pancreatic ductal dilatation or surrounding inflammatory changes. Spleen: Normal in size without focal abnormality. Adrenals/Urinary Tract: The adrenal glands unremarkable. There is no hydronephrosis on either side. The visualized ureters and urinary bladder appear unremarkable. Stomach/Bowel: There is normal obstruction. The appendix is normal. Several duodenal diverticula measure up to 4 cm. Vascular/Lymphatic: Mild aortoiliac atherosclerotic disease. The IVC is unremarkable no portal gas. There is no adenopathy. Reproductive: Hysterectomy.  Other: None Musculoskeletal: Left hip arthroplasty with associated streak artifact. Osteopenia with degenerative changes of the spine. No acute osseous pathology. Review of the MIP images confirms the above findings. IMPRESSION: 1. No acute intrathoracic, abdominal, or pelvic pathology. No CT evidence of pulmonary artery embolus. 2. Three-vessel coronary vascular calcification. 3.  Aortic Atherosclerosis (ICD10-I70.0). Electronically Signed   By: Angus Bark M.D.   On: 09/01/2023 20:49   CT ABDOMEN PELVIS W CONTRAST Result Date: 09/01/2023 CLINICAL DATA:  Concern for pulmonary embolism. Also abdominal pain. EXAM: CT ANGIOGRAPHY CHEST CT ABDOMEN AND PELVIS WITH CONTRAST TECHNIQUE: Multidetector CT imaging of the chest was performed using the standard protocol during bolus administration of intravenous contrast. Multiplanar CT image reconstructions and MIPs were obtained to evaluate the vascular anatomy. Multidetector CT imaging of the abdomen and pelvis was performed using the standard protocol during bolus administration of intravenous contrast. RADIATION DOSE REDUCTION: This exam was performed according to the departmental dose-optimization program which includes automated exposure control, adjustment of the mA and/or kV according to patient size and/or use of iterative reconstruction technique. CONTRAST:  OMNIPAQUE  IOHEXOL  350 MG/ML SOLN COMPARISON:  Chest radiograph dated 05/17/2023. FINDINGS: Evaluation of this exam is limited due to respiratory motion. CTA CHEST FINDINGS Cardiovascular: There is no cardiomegaly. Trace pericardial effusion. Three-vessel coronary vascular calcification. The thoracic aorta is unremarkable. The origins of the great vessels of the aortic arch are patent. No pulmonary artery embolus identified. Mediastinum/Nodes: No hilar or mediastinal adenopathy. The esophagus is grossly unremarkable no mediastinal fluid collection. No mediastinal fluid collection. Lungs/Pleura:  Bilateral lower lobe linear atelectasis/scarring. No focal consolidation, pleural effusion, or pneumothorax. The central airways are patent. Musculoskeletal: Osteopenia.  No acute osseous pathology. Review of the MIP images confirms the above findings. CT ABDOMEN and PELVIS FINDINGS No intra-abdominal free air or free fluid. Hepatobiliary: The liver is unremarkable. Mild dilatation. No calcified gallstone. Focal adenomyomatosis of the gallbladder fundus. Pancreas: Unremarkable. No pancreatic ductal dilatation or surrounding inflammatory changes. Spleen: Normal in size without focal abnormality. Adrenals/Urinary Tract: The adrenal glands unremarkable. There is no hydronephrosis on either side. The visualized ureters and urinary bladder appear unremarkable. Stomach/Bowel: There is normal obstruction. The appendix is normal. Several duodenal diverticula measure up to 4 cm. Vascular/Lymphatic: Mild aortoiliac atherosclerotic disease. The IVC is unremarkable no portal gas. There is no adenopathy. Reproductive: Hysterectomy. Other: None Musculoskeletal: Left hip arthroplasty with associated streak artifact. Osteopenia with degenerative changes of the spine. No acute osseous pathology. Review of  the MIP images confirms the above findings. IMPRESSION: 1. No acute intrathoracic, abdominal, or pelvic pathology. No CT evidence of pulmonary artery embolus. 2. Three-vessel coronary vascular calcification. 3.  Aortic Atherosclerosis (ICD10-I70.0). Electronically Signed   By: Angus Bark M.D.   On: 09/01/2023 20:49   CT HEAD WO CONTRAST ( ) Result Date: 09/01/2023 CLINICAL DATA:  Altered mental status EXAM: CT HEAD WITHOUT CONTRAST TECHNIQUE: Contiguous axial images were obtained from the base of the skull through the vertex without intravenous contrast. RADIATION DOSE REDUCTION: This exam was performed according to the departmental dose-optimization program which includes automated exposure control, adjustment of the mA  and/or kV according to patient size and/or use of iterative reconstruction technique. COMPARISON:  05/28/2023 FINDINGS: Brain: No evidence of acute infarction, hemorrhage, hydrocephalus, extra-axial collection or mass lesion/mass effect. Mild chronic white matter ischemic changes are again noted. 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 Vascular: No hyperdense vessel or unexpected calcification. Skull: Normal. Negative for fracture or focal lesion. Sinuses/Orbits: No acute finding. Other: None. IMPRESSION: Chronic changes without acute abnormality. Electronically Signed   By: Violeta Grey M.D.   On: 09/01/2023 20:39        Scheduled Meds:  aspirin   81 mg Oral Daily   fosfomycin  3 g Oral Once   gabapentin   300 mg Oral TID   levETIRAcetam   500 mg Oral BID   pantoprazole   40 mg Oral Daily   potassium chloride   40 mEq Oral Once   QUEtiapine  12.5 mg Oral 2 times per day   Continuous Infusions:  cefTRIAXone  (ROCEPHIN )  IV     magnesium  sulfate bolus IVPB 2 g (09/02/23 1029)     LOS: 0 days    Time spent: 45 minutes spent on chart review, discussion with nursing staff, consultants, updating family and interview/physical exam; more than 50% of that time was spent in counseling and/or coordination of care.    Enrigue Harvard, DO [facility] Available via Epic secure chat 7am-7pm After these hours, please refer to coverage provider listed on [email or web address] 09/02/2023, 11:04 AM

## 2023-09-02 NOTE — ED Notes (Signed)
 Patient left arm swollen upon assessment with coban on arm. Nurse removed coban at this time. No bleeding or drainage present. Patient room cleaned and bedside table cleaned off at this time. Patient readjusted in bed for comfort

## 2023-09-02 NOTE — TOC Initial Note (Signed)
 Transition of Care [facility]) - Initial/Assessment Note    Patient Details  Name: Rose Bridges MRN: [id number] Date of Birth: [birth year]  Transition of [facility] CM/SW Contact:    Ruben Corolla, RN Phone Number: 09/02/2023, 11:49 AM  Clinical Narrative: IVC 5/4-5/11/25 from [name]-Garrett Sake(Administrator) will be the contact if can return-he has voiced this is the 3rd IVC-must be cleared by psych prior returning.Aaron AasSpoke to [employer] in agreement to d/c back to [facility] if recommended.Patient is bedbound, Await psych eval, & PT recc.                   Expected Discharge Plan: Skilled Nursing Facility Barriers to Discharge: Continued Medical Work up   Patient Goals and CMS Choice Patient states their goals for this hospitalization and ongoing recovery are:: [place] CMS Medicare.gov Compare Post Acute Care list provided to:: Patient Represenative (must comment) (Emily(dtr)) Choice offered to / list presented to : Adult Children [place] ownership interest in [facility].provided to:: Adult Children    Expected Discharge Plan and Services   Discharge Planning Services: CM Consult Post Acute Care Choice: Skilled Nursing Facility Living arrangements for the past 2 months: Skilled Nursing Facility                                      Prior Living Arrangements/Services Living arrangements for the past 2 months: Skilled Nursing Facility Lives with:: Facility Resident   Do you feel safe going back to the place where you live?: Yes               Activities of Daily Living   ADL Screening (condition at time of admission) Independently performs ADLs?: No Does the patient have a NEW difficulty with bathing/dressing/toileting/self-feeding that is expected to last >3 days?: No Does the patient have a NEW difficulty with getting in/out of bed, walking, or climbing stairs that is expected to last >3 days?: No Does the patient have a NEW difficulty with  communication that is expected to last >3 days?: No Is the patient deaf or have difficulty hearing?: No Does the patient have difficulty seeing, even when wearing glasses/contacts?: No Does the patient have difficulty concentrating, remembering, or making decisions?: No  Permission Sought/Granted Permission sought to share information with : Case Manager Permission granted to share information with : Yes, Verbal Permission Granted              Emotional Assessment              Admission diagnosis:  Acute cystitis [N30.00] Tachycardia [R00.0] Hypotension, unspecified hypotension type [I95.9] Patient Active Problem List   Diagnosis Date Noted   Acute metabolic encephalopathy 09/02/2023   Dehydration 09/02/2023   Acute prerenal azotemia 09/02/2023   Hypomagnesemia 09/02/2023   Prolonged QT interval 09/02/2023   History of bipolar disorder 09/02/2023   History of seizures 09/02/2023   GERD (gastroesophageal reflux disease) 09/02/2023   Agitation 09/01/2023   Major depressive disorder 05/30/2023   Medically noncompliant 05/29/2023   Acute encephalopathy 04/07/2023   AKI (acute kidney injury) (HCC) 04/07/2023   Acute cystitis 04/07/2023   Depression with anxiety 04/07/2023   Chronic pain 04/07/2023   Epilepsy (HCC)    Alcohol dependence (HCC) 05/22/2022   Delirium due to another medical condition, acute, hyperactive 05/18/2022   Acute pancreatitis without infection or necrosis 05/14/2022   Abnormal findings on imaging of biliary tract  05/14/2022   AMS (altered mental status) 05/10/2022   Pelvic fracture (HCC) 04/03/2022   Tobacco use 04/03/2022   Osteogenesis imperfecta 04/28/2018   Seizure (HCC) 03/26/2017   PCP:  Diona Franklin [facility]:   [place], [place] - [address] [place] [address] [place] [address] [place] [address] Phone: [phone] Fax: [phone]     Social Drivers of Health (SDOH) Social History: SDOH Screenings   Food Insecurity: No Food  Insecurity (09/02/2023)  Housing: Unknown (09/02/2023)  Transportation Needs: No Transportation Needs (09/02/2023)  Utilities: Not At Risk (09/02/2023)  Depression (PHQ2-9): Medium Risk (09/09/2019)  Tobacco Use: Medium Risk (09/02/2023)   SDOH Interventions:     Readmission Risk Interventions    04/11/2023   11:14 AM  Readmission Risk Prevention Plan  Transportation Screening Complete  PCP or Specialist Appt within 3-5 Days Complete  HRI or Home Care Consult Complete  Social Work Consult for Recovery Care Planning/Counseling Complete  Palliative Care Screening Complete  Medication Review Oceanographer) Complete

## 2023-09-02 NOTE — Consult Note (Signed)
 Attempted to assess patient, however unable to do so due to state of lethargic.  Patient did respond to physical stimulation, however unable to keep her eyes closed.  Psychiatry consult placed for agitation, aggression, in the setting of a UTI.  Currently under IVC.  Psychiatry consult service will continue to follow.  There seems to be some question regarding underlying psychiatric condition contributing to her behaviors, and her skilled nursing facility is requesting psychiatric evaluation.  Will assess and provide recommendations, however patient will likely need neurology consult in an outpatient setting.  Do suspect neuropsychiatry presentation (dementia or CVA).

## 2023-09-02 NOTE — Consult Note (Signed)
 Spoke with patient's daughter in law Terisa Willing. 6 ED Admissions in 6 months Chronic Recurrent UTI with Severe Delirium

## 2023-09-02 NOTE — Progress Notes (Addendum)
 PT Cancellation Note  Patient Details Name: Rose Bridges MRN: [id number] DOB: December 22, 1960   Cancelled Treatment:    Reason Eval/Treat Not Completed: PT screened, no needs identified, will sign off. Pt is LTC SNF resident. Recommend return to facility once medically ready. Will sign off. *Addendum: Will attempt PT eval on 5/6.     Tanda Falter, PT Acute Rehabilitation  Office: [phone]

## 2023-09-03 ENCOUNTER — Encounter (HOSPITAL_COMMUNITY): Payer: Self-pay | Admitting: Internal Medicine

## 2023-09-03 DIAGNOSIS — R000 Tachycardia, unspecified: Secondary | ICD-10-CM

## 2023-09-03 DIAGNOSIS — I959 Hypotension, unspecified: Secondary | ICD-10-CM | POA: Diagnosis not present

## 2023-09-03 DIAGNOSIS — Z8659 Personal history of other mental and behavioral disorders: Secondary | ICD-10-CM | POA: Diagnosis not present

## 2023-09-03 DIAGNOSIS — G9341 Metabolic encephalopathy: Secondary | ICD-10-CM | POA: Diagnosis not present

## 2023-09-03 DIAGNOSIS — N3000 Acute cystitis without hematuria: Secondary | ICD-10-CM | POA: Diagnosis not present

## 2023-09-03 LAB — BLOOD CULTURE ID PANEL (REFLEXED) - BCID2

## 2023-09-03 LAB — CULTURE, BLOOD (ROUTINE X 2): Special Requests: ADEQUATE

## 2023-09-03 LAB — MAGNESIUM: Magnesium: 2.1 mg/dL (ref 1.7–2.4)

## 2023-09-03 LAB — URINE CULTURE: Culture: 20000 — AB

## 2023-09-03 MED ORDER — PIPERACILLIN-TAZOBACTAM 3.375 G IVPB
3.3750 g | Freq: Three times a day (TID) | INTRAVENOUS | Status: DC
  Administered 2023-09-03 – 2023-09-05 (×6): 3.375 g via INTRAVENOUS
  Filled 2023-09-03 (×6): qty 50

## 2023-09-03 MED ORDER — SODIUM CHLORIDE 0.9 % IV SOLN: 2.0000 g | INTRAVENOUS | Status: DC

## 2023-09-03 MED ORDER — SODIUM CHLORIDE 0.9 % IV SOLN
1.0000 g | INTRAVENOUS | Status: DC
  Filled 2023-09-03: qty 10

## 2023-09-03 MED ORDER — ACETAMINOPHEN 500 MG PO TABS
1000.0000 mg | ORAL_TABLET | Freq: Three times a day (TID) | ORAL | Status: DC
  Administered 2023-09-04 – 2023-09-05 (×4): 1000 mg via ORAL
  Filled 2023-09-03 (×5): qty 2

## 2023-09-03 NOTE — Progress Notes (Signed)
 PROGRESS NOTE    Rose Bridges  NWG:[id number] DOB: 1961-04-01 DOA: 09/01/2023 PCP: Diona Franklin    Brief Narrative:  Rose Bridges is a 63 y.o. female with medical history significant for bipolar disorder, seizure disorder, who is admitted to [facility] on 09/01/2023 with acute cystitis complicated by acute metabolic encephalopathy after presenting from inpatient psych facility to [facility] ED for evaluation of agitation.  Per chart review appears patient is a skilled nursing facility resident.     Assessment and Plan:    Acute cystitis causing AMS -IV abx-changed to Zosyn as culture showing Pseudomonas -Mental status appears improved as of 5/6     dehydration - Status post IVF -Encourage p.o. intake      hypokalemia/hypomagnesemia -replete    QTc prolongation: -monitor on tele  -replace K and Mg     Bipolar disorder:  -psych consult appreciated -Currently IVC     History of seizures -without clinical evidence to suggest active seizures at this time -keppra -unclear if she has been compliant with this medication      GERD: -PPI     Palliative care consult for goals of care     DVT prophylaxis: SCDs Start: 09/01/23 2232    Code Status: Full Code   Disposition Plan:  Level of care: Progressive Status is: Inpatient     Consultants:  psych   Subjective: No SOB, no CP-- says she walks normally  Objective: Vitals:   09/02/23 2053 09/02/23 2300 09/03/23 0500 09/03/23 0601  BP: 92/66   (!) 128/56  Pulse: (!) 102   87  Resp: 20 19    Temp:    98.2 F (36.8 C)  TempSrc:    Oral  SpO2:    99%  Weight:   56.6 kg   Height:        Intake/Output Summary (Last 24 hours) at 09/03/2023 1115 Last data filed at 09/03/2023 5784 Gross per 24 hour  Intake 1537.09 ml  Output --  Net 1537.09 ml   Filed Weights   09/01/23 1910 09/02/23 0110 09/03/23 0500  Weight: 54 kg 53.8 kg 56.6 kg    Examination:   General: Appearance:    Weak appearing  female in no acute distress     Lungs:     respirations unlabored  Heart:    Normal heart rate.    MS:   All extremities are intact.    Neurologic:   Awake but confused       Data Reviewed: I have personally reviewed following labs and imaging studies  CBC: Recent Labs  Lab 09/01/23 1323 09/02/23 0341  WBC 9.9 7.7  NEUTROABS 6.5 4.5  HGB 14.7 13.8  HCT 43.4 43.4  MCV 105.1* 111.0*  PLT 239 200   Basic Metabolic Panel: Recent Labs  Lab 09/01/23 1323 09/02/23 0341 09/03/23 0923  NA 138 140  --   K 3.5 3.2*  --   CL 105 107  --   CO2 23 21*  --   GLUCOSE 78 94  --   BUN 11 7*  --   CREATININE 0.50 0.47  --   CALCIUM  7.9* 8.0*  --   MG 1.3*  --  2.1   GFR: Estimated Creatinine Clearance: 61.3 mL/min (by C-G formula based on SCr of 0.47 mg/dL). Liver Function Tests: Recent Labs  Lab 09/01/23 1323 09/02/23 0341  AST 33 35  ALT 16 17  ALKPHOS 69 71  BILITOT 1.2 0.8  PROT 5.1* 5.5*  ALBUMIN 2.3*  2.4*   No results for input(s): "LIPASE", "AMYLASE" in the last 168 hours. No results for input(s): "AMMONIA" in the last 168 hours. Coagulation Profile: Recent Labs  Lab 09/02/23 0725  INR 1.0   Cardiac Enzymes: Recent Labs  Lab 09/02/23 0725  CKTOTAL 312*   BNP (last 3 results) No results for input(s): "PROBNP" in the last 8760 hours. HbA1C: No results for input(s): "HGBA1C" in the last 72 hours. CBG: No results for input(s): "GLUCAP" in the last 168 hours. Lipid Profile: No results for input(s): "CHOL", "HDL", "LDLCALC", "TRIG", "CHOLHDL", "LDLDIRECT" in the last 72 hours. Thyroid  Function Tests: Recent Labs    09/02/23 0725  TSH 0.417   Anemia Panel: Recent Labs    09/02/23 0725  VITAMINB12 1,419*   Sepsis Labs: Recent Labs  Lab 09/01/23 2139  LATICACIDVEN 1.9    Recent Results (from the past 240 hours)  Urine Culture     Status: Abnormal   Collection Time: 09/01/23  4:52 PM   Specimen: Urine, Catheterized  Result Value Ref Range  Status   Specimen Description   Final    URINE, CATHETERIZED Performed at [facility], [address], [place], [place] [address]    Special Requests   Final    NONE Performed at [facility], [address] [place], [place] [address]    Culture 20,000 COLONIES/mL PSEUDOMONAS AERUGINOSA (A)  Final   Report Status 09/03/2023 FINAL  Final   Organism ID, Bacteria PSEUDOMONAS AERUGINOSA (A)  Final      Susceptibility   Pseudomonas aeruginosa - MIC*    CEFTAZIDIME 4 SENSITIVE Sensitive     CIPROFLOXACIN <=0.25 SENSITIVE Sensitive     GENTAMICIN 4 SENSITIVE Sensitive     IMIPENEM 2 SENSITIVE Sensitive     PIP/TAZO <=4 SENSITIVE Sensitive ug/mL    CEFEPIME 2 SENSITIVE Sensitive     * 20,000 COLONIES/mL PSEUDOMONAS AERUGINOSA  Blood culture (routine x 2)     Status: None (Preliminary result)   Collection Time: 09/01/23  9:33 PM   Specimen: BLOOD  Result Value Ref Range Status   Specimen Description   Final    BLOOD BLOOD LEFT FOREARM Performed at [facility], [address], [place], [place] [address]    Special Requests   Final    BOTTLES DRAWN AEROBIC AND ANAEROBIC Blood Culture adequate volume Performed at [facility], [address] [place], [place] [address]    Culture  Setup Time   Final    GRAM POSITIVE COCCI IN CLUSTERS AEROBIC BOTTLE ONLY CRITICAL RESULT CALLED TO, READ BACK BY AND VERIFIED WITH: PHARMD M LILLISTON 09/02/2023 @ 0000 BY AB Performed at [facility], [address], [place], [place] [address]    Culture GRAM POSITIVE COCCI  Final   Report Status PENDING  Incomplete  Blood Culture ID Panel (Reflexed)     Status: Abnormal   Collection Time: 09/01/23  9:33 PM  Result Value Ref Range Status   Enterococcus faecalis NOT DETECTED NOT DETECTED Final   Enterococcus Faecium NOT DETECTED NOT DETECTED Final   Listeria monocytogenes NOT DETECTED NOT DETECTED Final   Staphylococcus  species DETECTED (A) NOT DETECTED Final    Comment: CRITICAL RESULT CALLED TO, READ BACK BY AND VERIFIED WITH: PHARMD M LILLISTON 09/02/2023 @ 0000 BY AB    Staphylococcus aureus (BCID) NOT DETECTED NOT DETECTED Final   Staphylococcus epidermidis NOT DETECTED NOT DETECTED Final   Staphylococcus lugdunensis NOT DETECTED  NOT DETECTED Final   Streptococcus species NOT DETECTED NOT DETECTED Final   Streptococcus agalactiae NOT DETECTED NOT DETECTED Final   Streptococcus pneumoniae NOT DETECTED NOT DETECTED Final   Streptococcus pyogenes NOT DETECTED NOT DETECTED Final   A.calcoaceticus-baumannii NOT DETECTED NOT DETECTED Final   Bacteroides fragilis NOT DETECTED NOT DETECTED Final   Enterobacterales NOT DETECTED NOT DETECTED Final   Enterobacter cloacae complex NOT DETECTED NOT DETECTED Final   Escherichia coli NOT DETECTED NOT DETECTED Final   Klebsiella aerogenes NOT DETECTED NOT DETECTED Final   Klebsiella oxytoca NOT DETECTED NOT DETECTED Final   Klebsiella pneumoniae NOT DETECTED NOT DETECTED Final   Proteus species NOT DETECTED NOT DETECTED Final   Salmonella species NOT DETECTED NOT DETECTED Final   Serratia marcescens NOT DETECTED NOT DETECTED Final   Haemophilus influenzae NOT DETECTED NOT DETECTED Final   Neisseria meningitidis NOT DETECTED NOT DETECTED Final   Pseudomonas aeruginosa NOT DETECTED NOT DETECTED Final   Stenotrophomonas maltophilia NOT DETECTED NOT DETECTED Final   Candida albicans NOT DETECTED NOT DETECTED Final   Candida auris NOT DETECTED NOT DETECTED Final   Candida glabrata NOT DETECTED NOT DETECTED Final   Candida krusei NOT DETECTED NOT DETECTED Final   Candida parapsilosis NOT DETECTED NOT DETECTED Final   Candida tropicalis NOT DETECTED NOT DETECTED Final   Cryptococcus neoformans/gattii NOT DETECTED NOT DETECTED Final    Comment: Performed at [facility], [address], [place], [place] [address]  Blood culture (routine x 2)     Status: None  (Preliminary result)   Collection Time: 09/02/23  3:41 AM   Specimen: BLOOD RIGHT ARM  Result Value Ref Range Status   Specimen Description   Final    BLOOD RIGHT ARM Performed at [facility], [address], [place], [place] [address]    Special Requests   Final    BOTTLES DRAWN AEROBIC ONLY Blood Culture results may not be optimal due to an inadequate volume of blood received in culture bottles Performed at [facility], [address], [place], [place] [address]    Culture   Final    NO GROWTH 1 DAY Performed at [facility], [address] [place], [place] [address]    Report Status PENDING  Incomplete         Radiology Studies: CT Angio Chest PE W and/or Wo Contrast Result Date: 09/01/2023 CLINICAL DATA:  Concern for pulmonary embolism. Also abdominal pain. EXAM: CT ANGIOGRAPHY CHEST CT ABDOMEN AND PELVIS WITH CONTRAST TECHNIQUE: Multidetector CT imaging of the chest was performed using the standard protocol during bolus administration of intravenous contrast. Multiplanar CT image reconstructions and MIPs were obtained to evaluate the vascular anatomy. Multidetector CT imaging of the abdomen and pelvis was performed using the standard protocol during bolus administration of intravenous contrast. RADIATION DOSE REDUCTION: This exam was performed according to the departmental dose-optimization program which includes automated exposure control, adjustment of the mA and/or kV according to patient size and/or use of iterative reconstruction technique. CONTRAST:  OMNIPAQUE  IOHEXOL  350 MG/ML SOLN COMPARISON:  Chest radiograph dated 05/17/2023. FINDINGS: Evaluation of this exam is limited due to respiratory motion. CTA CHEST FINDINGS Cardiovascular: There is no cardiomegaly. Trace pericardial effusion. Three-vessel coronary vascular calcification. The thoracic aorta is unremarkable. The origins of the great vessels of the aortic arch are patent. No  pulmonary artery embolus identified. Mediastinum/Nodes: No hilar or mediastinal adenopathy. The esophagus is grossly unremarkable no mediastinal fluid collection.  No mediastinal fluid collection. Lungs/Pleura: Bilateral lower lobe linear atelectasis/scarring. No focal consolidation, pleural effusion, or pneumothorax. The central airways are patent. Musculoskeletal: Osteopenia.  No acute osseous pathology. Review of the MIP images confirms the above findings. CT ABDOMEN and PELVIS FINDINGS No intra-abdominal free air or free fluid. Hepatobiliary: The liver is unremarkable. Mild dilatation. No calcified gallstone. Focal adenomyomatosis of the gallbladder fundus. Pancreas: Unremarkable. No pancreatic ductal dilatation or surrounding inflammatory changes. Spleen: Normal in size without focal abnormality. Adrenals/Urinary Tract: The adrenal glands unremarkable. There is no hydronephrosis on either side. The visualized ureters and urinary bladder appear unremarkable. Stomach/Bowel: There is normal obstruction. The appendix is normal. Several duodenal diverticula measure up to 4 cm. Vascular/Lymphatic: Mild aortoiliac atherosclerotic disease. The IVC is unremarkable no portal gas. There is no adenopathy. Reproductive: Hysterectomy. Other: None Musculoskeletal: Left hip arthroplasty with associated streak artifact. Osteopenia with degenerative changes of the spine. No acute osseous pathology. Review of the MIP images confirms the above findings. IMPRESSION: 1. No acute intrathoracic, abdominal, or pelvic pathology. No CT evidence of pulmonary artery embolus. 2. Three-vessel coronary vascular calcification. 3.  Aortic Atherosclerosis (ICD10-I70.0). Electronically Signed   By: Angus Bark M.D.   On: 09/01/2023 20:49   CT ABDOMEN PELVIS W CONTRAST Result Date: 09/01/2023 CLINICAL DATA:  Concern for pulmonary embolism. Also abdominal pain. EXAM: CT ANGIOGRAPHY CHEST CT ABDOMEN AND PELVIS WITH CONTRAST TECHNIQUE:  Multidetector CT imaging of the chest was performed using the standard protocol during bolus administration of intravenous contrast. Multiplanar CT image reconstructions and MIPs were obtained to evaluate the vascular anatomy. Multidetector CT imaging of the abdomen and pelvis was performed using the standard protocol during bolus administration of intravenous contrast. RADIATION DOSE REDUCTION: This exam was performed according to the departmental dose-optimization program which includes automated exposure control, adjustment of the mA and/or kV according to patient size and/or use of iterative reconstruction technique. CONTRAST:  OMNIPAQUE  IOHEXOL  350 MG/ML SOLN COMPARISON:  Chest radiograph dated 05/17/2023. FINDINGS: Evaluation of this exam is limited due to respiratory motion. CTA CHEST FINDINGS Cardiovascular: There is no cardiomegaly. Trace pericardial effusion. Three-vessel coronary vascular calcification. The thoracic aorta is unremarkable. The origins of the great vessels of the aortic arch are patent. No pulmonary artery embolus identified. Mediastinum/Nodes: No hilar or mediastinal adenopathy. The esophagus is grossly unremarkable no mediastinal fluid collection. No mediastinal fluid collection. Lungs/Pleura: Bilateral lower lobe linear atelectasis/scarring. No focal consolidation, pleural effusion, or pneumothorax. The central airways are patent. Musculoskeletal: Osteopenia.  No acute osseous pathology. Review of the MIP images confirms the above findings. CT ABDOMEN and PELVIS FINDINGS No intra-abdominal free air or free fluid. Hepatobiliary: The liver is unremarkable. Mild dilatation. No calcified gallstone. Focal adenomyomatosis of the gallbladder fundus. Pancreas: Unremarkable. No pancreatic ductal dilatation or surrounding inflammatory changes. Spleen: Normal in size without focal abnormality. Adrenals/Urinary Tract: The adrenal glands unremarkable. There is no hydronephrosis on either side.  The visualized ureters and urinary bladder appear unremarkable. Stomach/Bowel: There is normal obstruction. The appendix is normal. Several duodenal diverticula measure up to 4 cm. Vascular/Lymphatic: Mild aortoiliac atherosclerotic disease. The IVC is unremarkable no portal gas. There is no adenopathy. Reproductive: Hysterectomy. Other: None Musculoskeletal: Left hip arthroplasty with associated streak artifact. Osteopenia with degenerative changes of the spine. No acute osseous pathology. Review of the MIP images confirms the above findings. IMPRESSION: 1. No acute intrathoracic, abdominal, or pelvic pathology. No CT evidence of pulmonary artery embolus. 2. Three-vessel coronary vascular calcification. 3.  Aortic Atherosclerosis (ICD10-I70.0). Electronically  Signed   By: Arash  Radparvar M.D.   On: 09/01/2023 20:49   CT HEAD WO CONTRAST ( ) Result Date: 09/01/2023 CLINICAL DATA:  Altered mental status EXAM: CT HEAD WITHOUT CONTRAST TECHNIQUE: Contiguous axial images were obtained from the base of the skull through the vertex without intravenous contrast. RADIATION DOSE REDUCTION: This exam was performed according to the departmental dose-optimization program which includes automated exposure control, adjustment of the mA and/or kV according to patient size and/or use of iterative reconstruction technique. COMPARISON:  05/28/2023 FINDINGS: Brain: No evidence of acute infarction, hemorrhage, hydrocephalus, extra-axial collection or mass lesion/mass effect. Mild chronic white matter ischemic changes are again noted. 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 Vascular: No hyperdense vessel or unexpected calcification. Skull: Normal. Negative for fracture or focal lesion. Sinuses/Orbits: No acute finding. Other: None. IMPRESSION: Chronic changes without acute abnormality. Electronically Signed   By: Violeta Grey M.D.   On: 09/01/2023 20:39        Scheduled Meds:  acetaminophen   1,000 mg Oral TID   aspirin   81 mg Oral Daily   gabapentin   300 mg Oral TID   levETIRAcetam   500 mg Oral BID   pantoprazole   40 mg Oral Daily   potassium chloride   40 mEq Oral Once   QUEtiapine  12.5 mg Oral 2 times per day   Continuous Infusions:  sodium chloride  75 mL/hr at 09/02/23 1856   piperacillin-tazobactam (ZOSYN)  IV 3.375 g (09/03/23 1105)     LOS: 1 day    Time spent: 45 minutes spent on chart review, discussion with nursing staff, consultants, updating family and interview/physical exam; more than 50% of that time was spent in counseling and/or coordination of care.    Enrigue Harvard, DO [facility] Available via Epic secure chat 7am-7pm After these hours, please refer to coverage provider listed on [email or web address] 09/03/2023, 11:15 AM

## 2023-09-03 NOTE — Progress Notes (Addendum)
 PHARMACY - PHYSICIAN COMMUNICATION CRITICAL VALUE ALERT - BLOOD CULTURE IDENTIFICATION (BCID)  Rose Bridges is an 63 y.o. female who presented to [facility] on 09/01/2023 with a chief complaint of aggressive behavior at residential nursing facility.  Assessment:  Admit with acute cystitis causing AMS.   Urine cx + 20K GNR- sensitivities pending.  1/2 Aerobic blood cx bottles collected now growing GPC clusters.  BCID + Staph species.    Name of physician (or Provider) Contacted: Denece Finger, NP  Current antibiotics: Rocephin   Changes to prescribed antibiotics recommended:  On appropriate antibiotics for UTI.  Blood cx data likely contaminant.  F/U cx data, sensitivities  Results for orders placed or performed during the hospital encounter of 09/01/23  Blood Culture ID Panel (Reflexed) (Collected: 09/01/2023  9:33 PM)  Result Value Ref Range   Enterococcus faecalis NOT DETECTED NOT DETECTED   Enterococcus Faecium NOT DETECTED NOT DETECTED   Listeria monocytogenes NOT DETECTED NOT DETECTED   Staphylococcus species DETECTED (A) NOT DETECTED   Staphylococcus aureus (BCID) NOT DETECTED NOT DETECTED   Staphylococcus epidermidis NOT DETECTED NOT DETECTED   Staphylococcus lugdunensis NOT DETECTED NOT DETECTED   Streptococcus species NOT DETECTED NOT DETECTED   Streptococcus agalactiae NOT DETECTED NOT DETECTED   Streptococcus pneumoniae NOT DETECTED NOT DETECTED   Streptococcus pyogenes NOT DETECTED NOT DETECTED   A.calcoaceticus-baumannii NOT DETECTED NOT DETECTED   Bacteroides fragilis NOT DETECTED NOT DETECTED   Enterobacterales NOT DETECTED NOT DETECTED   Enterobacter cloacae complex NOT DETECTED NOT DETECTED   Escherichia coli NOT DETECTED NOT DETECTED   Klebsiella aerogenes NOT DETECTED NOT DETECTED   Klebsiella oxytoca NOT DETECTED NOT DETECTED   Klebsiella pneumoniae NOT DETECTED NOT DETECTED   Proteus species NOT DETECTED NOT DETECTED   Salmonella species NOT DETECTED NOT  DETECTED   Serratia marcescens NOT DETECTED NOT DETECTED   Haemophilus influenzae NOT DETECTED NOT DETECTED   Neisseria meningitidis NOT DETECTED NOT DETECTED   Pseudomonas aeruginosa NOT DETECTED NOT DETECTED   Stenotrophomonas maltophilia NOT DETECTED NOT DETECTED   Candida albicans NOT DETECTED NOT DETECTED   Candida auris NOT DETECTED NOT DETECTED   Candida glabrata NOT DETECTED NOT DETECTED   Candida krusei NOT DETECTED NOT DETECTED   Candida parapsilosis NOT DETECTED NOT DETECTED   Candida tropicalis NOT DETECTED NOT DETECTED   Cryptococcus neoformans/gattii NOT DETECTED NOT DETECTED    Arie Kurtz PharmD 09/03/2023  12:35 AM

## 2023-09-03 NOTE — Evaluation (Addendum)
 Physical Therapy Brief Evaluation and Discharge Note Patient Details Name: Rose Bridges MRN: [id number] DOB: [birth year] Today's Date: 09/03/2023   History of Present Illness  63 yo female admitted from SNF to hospital under IVC with acute cystitis, agitation, AMS. Hx of bipolar d/o, Sz d/o, ETOH use d/o, osteogenesis imperfecta, hip replacement, non compliance  Clinical Impression  Limited bed level eval only. Pt not interested in mobilizing with physical therapy-reports bad experiences with therapy. She did follow 1 step commands during session. She was able to state her name and birthdate-she laughed when I asked her to state location. Majority of conversation was tangential. She did lift her arms and squeeze therapist's fingers when asked. She stated shoulder ROM was limited and painful due to bad shoulders. She did not wish for me to assess her LEs. Spoke with RN who reported that pt's daughter states pt is nonambulatory. TOC note indicates pt is a LTC SNF resident and that pt is bedbound at baseline. Pt is not appropriate (nor willing to participate) for acute therapy services. 1x eval. Will sign off. Recommend return to LTC SNF once medically ready.      PT Assessment    Assistance Needed at Discharge       Equipment Recommendations None recommended by PT  Recommendations for Other Services       Precautions/Restrictions Precautions Precautions: Fall Restrictions Weight Bearing Restrictions Per Provider Order: No        Mobility  Bed Mobility          Transfers                        Ambulation/Gait              Home Activity Instructions    Stairs            Modified Rankin (Stroke Patients Only)        Balance                          Pertinent Vitals/Pain   Pain Assessment Pain Assessment: No/denies pain     Home Living               Additional Comments: LTC SNF resident    Prior Function   nonambulatory. last ambulatory with PT, per chart review, in 04/2022. Max-Total A for bed mobility 03/2023.       UE/LE Assessment               Communication   Communication Factors Affecting Communication: Difficulty expressing self     Cognition         General Comments      Exercises     Assessment/Plan    PT Problem List         PT Visit Diagnosis      No Skilled PT Patient at baseline level of functioning   Co-evaluation                AMPAC 6 Clicks Help needed turning from your back to your side while in a flat bed without using bedrails?: Total Help needed moving from lying on your back to sitting on the side of a flat bed without using bedrails?: Total Help needed moving to and from a bed to a chair (including a wheelchair)?: Total Help needed standing up from a chair using your arms (e.g., wheelchair or bedside chair)?: Total Help needed to walk in hospital  room?: Total Help needed climbing 3-5 steps with a railing? : Total 6 Click Score: 6      End of Session   Activity Tolerance: Patient tolerated treatment well Patient left: in bed;with call bell/phone within reach;with bed alarm set         Time: 7829-5621 PT Time Calculation (min) (ACUTE ONLY): 13 min  Charges:   PT Evaluation $PT Eval Low Complexity: 1 Low         Tanda Falter, PT Acute Rehabilitation  Office: [phone]

## 2023-09-03 NOTE — Consult Note (Addendum)
 [facility] Psychiatric Consult Initial  Patient Name: .Rose Bridges  MRN: [id number]  DOB: [birth year]  Consult Order details:  Orders (From admission, onward)     Start     Ordered   09/01/23 1440  CONSULT TO CALL ACT TEAM       Ordering Provider: Lind Repine, MD  Provider:  (Not yet assigned)  Question:  Reason for Consult?  Answer:  Psych consult   09/01/23 1440             Mode of Visit: In person    Psychiatry Consult Evaluation  Service Date: Sep 03, 2023 LOS:  LOS: 1 day  Chief Complaint Agitation, Aggression, AMS  Primary Psychiatric Diagnoses  Altered Mental status 2.  Agitation due other Medical condition.  Assessment  Rose Bridges is a 64 y.o. female admitted: Presented to the EDfor 09/01/2023  1:01 PM for Agitation, Aggression, AMS. She carries the psychiatric diagnoses of Bipolar disorder, MDD, Personality disorder, Agoraphobia and Schizophrenia. and has a past medical history of  Chronic pain, AKI, Epilepsy, Osteogenesis imperfecta, Pelvic Fracture.   Her current presentation of agitation and aggression is most consistent with UTI/ Depression.   She meets criteria for inpatient Psychiatry hospitalization based on symptoms exhibited.  Current outpatient psychotropic medications include Cymbalta  and historically she has had a unknown response to these medications. She was unknown compliant with medications prior to admission as evidenced by documentation from facility. On initial examination, patient was irritable but cooperative. She is calm at this time. Her speech is normal, and having circumstantial thought processes. She is very talkative and at times confused. She is alert and oriented x 2.She thinks she is at [facility], and that her brother had her moved to the hospital. After reorientation patient, becomes much more cooperative and talkative. She is lucid and agrees to return to [facility]. She also agrees to continue treatment with a goal to  discharge home. She is unable to explain the overt somnolence from yesterday but states she feels ok. She denies any acute psychiatric concerns at this. She denies any depression, anxiety, trauma, mania, or psychosis. She is irritable when discussing her brother, but no threats made to harm him. Patient seems to like being able to take care of self. She verbalizes she is at high risk for falls and hip fractures, and has some insight into her osteoporosis. She ruminates about her dentures and losing her teeth due to osteoporosis. She reports having a poor appetite as a result, this is not new symptom for her. She denies any weight loss associated with this. Chart review shows weight of 59kg from March 2024.   Patient's agitation and aggression are suspected to be secondary to a urinary tract infection (UTI). Previous behavioral exacerbations have coincided with infections, and elderly patients are well-documented to experience delirium and acute cognitive changes in the setting of infections, including agitation, aggression, disorganized thought processes, and mood instability. Cognitive recovery often lags behind medical recovery, sometimes persisting for several days or weeks after infection resolution. While delirium and cognitive impairment are likely contributors, there is concern that the patient may also be developing an underlying neurocognitive disorder. Given that symptom exacerbation in medically acute states can mimic primary psychiatric illness, an acute psychiatric diagnosis is not recommended at this time. The focus remains symptom management and stabilization.  During this admission, the majority of recommended laboratory studies (CBC, CMP, UA, TSH, Folate) have been obtained and are within normal limits. Of note, B12 was elevated  at 1419. No focal neurological deficits were observed on today's exam, and the patient is no longer displaying disruptive behaviors, appearing to be returning to her  baseline. It is recommended that outpatient neurology evaluation be arranged following medical stabilization to assess for potential underlying dementia or neurocognitive disorder. Should psychiatric symptoms persist or worsen following resolution of infection, repeat laboratory assessment may be warranted to rule out new or ongoing contributing factors.  Delirium triggered by infection is a well-established phenomenon in elderly patients, and psychiatric evaluations are best deferred until after acute medical conditions are addressed to avoid misdiagnosis. Continued medical management and close monitoring are appropriate at this time.   Diagnoses:  Active Hospital problems: Principal Problem:   Acute cystitis Active Problems:   Acute metabolic encephalopathy   Dehydration   Acute prerenal azotemia   Hypomagnesemia   Prolonged QT interval   History of bipolar disorder   History of seizures   GERD (gastroesophageal reflux disease)    Plan   ## Psychiatric Medication Recommendations:  Resume Cymbalta  30 mg bid for depression Continue Seroquel 12.5 mg for mood and agitation  ## Medical Decision Making Capacity: Not specifically addressed in this encounter  ## Further Work-up:  -Neurology referral outpatient follow-up. Consider MRI to establish new imaging and compare to previous testing.  -- most recent EKG on 09/01/2023 had QtC of 452 -- Pertinent labwork reviewed earlier this admission includes: CBC, CMP, UA, UDS   ## Disposition:-- There are no psychiatric contraindications to discharge at this time  ## Behavioral / Environmental: -To minimize splitting of staff, assign one staff person to communicate all information from the team when feasible. or Utilize compassion and acknowledge the patient's experiences while setting clear and realistic expectations for care.    ## Safety and Observation Level:  - Based on my clinical evaluation, I estimate the patient to be at No risk of  self harm in the current setting. - At this time, we recommend  routine. This decision is based on my review of the chart including patient's history and current presentation, interview of the patient, mental status examination, and consideration of suicide risk including evaluating suicidal ideation, plan, intent, suicidal or self-harm behaviors, risk factors, and protective factors. This judgment is based on our ability to directly address suicide risk, implement suicide prevention strategies, and develop a safety plan while the patient is in the clinical setting. Please contact our team if there is a concern that risk level has changed.  CSSR Risk Category:C-SSRS RISK CATEGORY: No Risk  Suicide Risk Assessment: Patient has following modifiable risk factors for suicide: under treated depression  and medication noncompliance, which we are addressing by recommending medication management as outlined above and return to skilled nursing facility, [facility] once she is medically stabilized. Patient has following non-modifiable or demographic risk factors for suicide: na Patient has the following protective factors against suicide: Supportive family  Thank you for this consult request. Recommendations have been communicated to the primary team.  We will sign off at this time.   Rella Cardinal, FNP-PMHNP-BC       History of Present Illness  Relevant Aspects of Hospital ED Course:  Admitted on 09/01/2023 for Agitation, Aggression and AMS  History includes Bipolar Disorder, Major Depressive Disorder, Personality Disorder, Agoraphobia, and Schizophrenia. She was IVC'd for threatening staff, throwing objects, and making homicidal threats toward her brother. On evaluation, patient continued to verbalize fixed delusions regarding her brother, with significant rumination and poor participation in assessment. Speech  was normal but content was disorganized. She denied psychiatric history and current  mental illness despite documentation from the facility listing psychiatric diagnoses and treatment with Cymbalta . Patient is bedbound at baseline per chart review and admits to limited mobility. There is no contact information available for her brother. Cymbalta  has been resumed. Symptoms are suspected to be exacerbated by a urinary tract infection. Patient denies suicidal or homicidal ideation at this time. No changes noted in her clinical status.  Psych ROS:  Depression: Denies Anxiety:  Denies Mania (lifetime and current): Hx of but denies today Psychosis: (lifetime and current): na  Collateral information:    Review of Systems  Psychiatric/Behavioral: Negative.    All other systems reviewed and are negative.    Psychiatric and Social History  Psychiatric History:  Information collected from Patient/ chart  Prev Dx/Sx: see Current Psych Provider: SNF provider Home Meds (current): see above Previous Med Trials: UTA Therapy: uta  Prior Psych Hospitalization: none  Prior Self Harm: uta Prior Violence: uta  Family Psych History: uta Family Hx suicide: uta  Social History:  Developmental Hx: wnl Educational Hx: collage degree Occupational Hx: Clinical cytogeneticist Hx: na Living Situation: [name] Spiritual Hx: uta Access to weapons/lethal means: None  Substance History Alcohol: Denies  Tobacco: uta Illicit drugs: denies Prescription drug abuse: denies Rehab hx: uta  Exam Findings  Physical Exam:  Vital Signs:  Temp:  [98.2 F (36.8 C)-98.3 F (36.8 C)] 98.2 F (36.8 C) (05/06 0601) Pulse Rate:  [87-106] 97 (05/06 1406) Resp:  [18-20] 18 (05/06 1406) BP: (90-128)/(50-86) 98/86 (05/06 1406) SpO2:  [95 %-99 %] 95 % (05/06 1406) Weight:  [56.6 kg] 56.6 kg (05/06 0500) Blood pressure 98/86, pulse 97, temperature 98.2 F (36.8 C), temperature source Oral, resp. rate 18, height 5\' 4"  (1.626 m), weight 56.6 kg, SpO2 95%. Body mass index  is 21.42 kg/m.  Physical Exam  Mental Status Exam: General Appearance: Casual  Orientation:  Other:  to self and time  Memory:  Immediate;   Fair Recent;   Fair Remote;   Fair  Concentration:  Concentration: Poor and Attention Span: Poor  Recall:  Poor  Attention  Poor  Eye Contact:  Minimal  Speech:  Pressured and rapid, repetition  Language:  Good  Volume:  Increased  Mood: UTA, refused to state mood  Affect:  Labile and angry  Thought Process:  Irrelevant and Descriptions of Associations: Circumstantial  Thought Content:  Rumination and Tangential  Suicidal Thoughts: Denies  Homicidal Thoughts:   Denies  Judgement:  Poor  Insight:  Lacking  Psychomotor Activity:  Normal  Akathisia:  NA  Fund of Knowledge:  Fair      Assets:  Housing Resilience Social Support  Cognition:  Impaired,  Moderate  ADL's:  Intact  AIMS (if indicated):        Other History   These have been pulled in through the EMR, reviewed, and updated if appropriate.  Family History:  The patient's family history includes Heart attack in her mother; Hypertension in her mother; Lung cancer in her father; Stroke in her brother, mother, and sister.  Medical History: Past Medical History:  Diagnosis Date   Agoraphobia    Arthritis    Bipolar disorder (HCC)    Degeneration of lumbar intervertebral disc    Disseminated intravascular coagulation (HCC)    Epilepsy (HCC)    Fracture of unspecified parts of lumbosacral spine and pelvis, sequela    History of falling  Hyperlipemia    Hypertension    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[facility])    Metabolic encephalopathy    Osteogenesis imperfecta    Osteogenesis imperfecta    Osteoporosis    Personality disorder (HCC)    PVD (peripheral vascular disease) (HCC)    Stroke [facility])     Surgical History: Past Surgical History:  Procedure Laterality Date   ABDOMINAL HYSTERECTOMY     BIOPSY  07/05/2022   Procedure: BIOPSY;  Surgeon:  Normie Becton., MD;  Location: [facility];  Service: Gastroenterology;;   ESOPHAGOGASTRODUODENOSCOPY N/A 07/05/2022   Procedure: ESOPHAGOGASTRODUODENOSCOPY (EGD);  Surgeon: Normie Becton., MD;  Location: [facility];  Service: Gastroenterology;  Laterality: N/A;   EUS N/A 07/05/2022   Procedure: UPPER ENDOSCOPIC ULTRASOUND (EUS) LINEAR;  Surgeon: Normie Becton., MD;  Location: [facility] ENDOSCOPY;  Service: Gastroenterology;  Laterality: N/A;   HIP ARTHROPLASTY Left 09/26/2015   Procedure: ARTHROPLASTY BIPOLAR HIP (HEMIARTHROPLASTY);  Surgeon: Saundra Curl, MD;  Location: [facility] OR;  Service: Orthopedics;  Laterality: Left;   ORIF HUMERUS FRACTURE Right 11/06/2016   Procedure: OPEN REDUCTION INTERNAL FIXATION (ORIF) PROXIMAL HUMERUS FRACTURE;  Surgeon: Saundra Curl, MD;  Location: [facility] OR;  Service: Orthopedics;  Laterality: Right;     Medications:   Current Facility-Administered Medications:    acetaminophen  (TYLENOL ) tablet 1,000 mg, 1,000 mg, Oral, TID, Vann, Jessica U, DO   aspirin  chewable tablet 81 mg, 81 mg, Oral, Daily, Lind Repine, MD   gabapentin  (NEURONTIN ) capsule 300 mg, 300 mg, Oral, TID, Lind Repine, MD   levETIRAcetam  (KEPPRA ) tablet 500 mg, 500 mg, Oral, BID, Lind Repine, MD, 500 mg at 09/03/23 1100   ondansetron  (ZOFRAN ) injection 4 mg, 4 mg, Intravenous, Q6H PRN, Howerter, Justin B, DO   oxyCODONE  (Oxy IR/ROXICODONE ) immediate release tablet 5 mg, 5 mg, Oral, Q6H PRN, Lind Repine, MD, 5 mg at 09/03/23 1100   pantoprazole  (PROTONIX ) EC tablet 40 mg, 40 mg, Oral, Daily, Lind Repine, MD   piperacillin-tazobactam (ZOSYN) IVPB 3.375 g, 3.375 g, Intravenous, Q8H, Vann, Jessica U, DO, Last Rate: 12.5 mL/hr at 09/03/23 1105, 3.375 g at 09/03/23 1105   potassium chloride  SA (KLOR-CON  M) CR tablet 40 mEq, 40 mEq, Oral, Once, Vann, Jessica U, DO   QUEtiapine (SEROQUEL) tablet 12.5 mg, 12.5 mg, Oral, 2 times per day, Onuoha, Josephine C, NP, 12.5 mg  at 09/01/23 2039  Allergies: Allergies  Allergen Reactions   Codeine Other (See Comments)    Migraines     Rella Cardinal, FNP  I have reviewed the note by NP Starkes-Perry, and discussed the plan of care.  I am in agreement with the assessment and plan. While future psychiatric events cannot be accurately predicted, the patient does not currently require acute inpatient psychiatric care and does not currently meet [facility]  involuntary commitment criteria. Psychiatry will sign off.  Please re-consult for any future acute psychiatric concerns.   Mervyn Ace, MD

## 2023-09-03 NOTE — TOC Progression Note (Signed)
 Transition of Care [facility]) - Progression Note    Patient Details  Name: Rose Bridges MRN: [id number] Date of Birth: 05-26-1960  Transition of Care [facility]) CM/SW Contact  Trinitee Horgan, Thersia Flax, RN Phone Number: 09/03/2023, 4:38 PM  Clinical Narrative:  Rescinded IVC/provided commitment change w/acceptance from clerk of courts. Awaiting return back to [name] once medically stable.     Expected Discharge Plan: Skilled Nursing Facility Barriers to Discharge: Continued Medical Work up  Expected Discharge Plan and Services   Discharge Planning Services: CM Consult Post Acute Care Choice: Skilled Nursing Facility Living arrangements for the past 2 months: Skilled Nursing Facility                                       Social Determinants of Health (SDOH) Interventions SDOH Screenings   Food Insecurity: No Food Insecurity (09/02/2023)  Housing: Unknown (09/02/2023)  Transportation Needs: No Transportation Needs (09/02/2023)  Utilities: Not At Risk (09/02/2023)  Depression (PHQ2-9): Medium Risk (09/09/2019)  Tobacco Use: Medium Risk (09/03/2023)    Readmission Risk Interventions    04/11/2023   11:14 AM  Readmission Risk Prevention Plan  Transportation Screening Complete  PCP or Specialist Appt within 3-5 Days Complete  HRI or Home Care Consult Complete  Social Work Consult for Recovery Care Planning/Counseling Complete  Palliative Care Screening Complete  Medication Review Oceanographer) Complete

## 2023-09-04 DIAGNOSIS — G9341 Metabolic encephalopathy: Secondary | ICD-10-CM | POA: Diagnosis not present

## 2023-09-04 LAB — BASIC METABOLIC PANEL WITH GFR
Anion gap: 11 (ref 5–15)
BUN: <5 mg/dL — ABNORMAL LOW (ref 8–23)
CO2: 23 mmol/L (ref 22–32)
Calcium: 8.1 mg/dL — ABNORMAL LOW (ref 8.9–10.3)
Chloride: 101 mmol/L (ref 98–111)
Creatinine, Ser: 0.36 mg/dL — ABNORMAL LOW (ref 0.44–1.00)
GFR, Estimated: >60 mL/min (ref >60)
Glucose, Bld: 74 mg/dL (ref 70–99)
Potassium: 3 mmol/L — ABNORMAL LOW (ref 3.5–5.1)
Sodium: 135 mmol/L (ref 135–145)

## 2023-09-04 LAB — CBC
HCT: 44.7 % (ref 36.0–46.0)
Hemoglobin: 14.2 g/dL (ref 12.0–15.0)
MCH: 35.4 pg — ABNORMAL HIGH (ref 26.0–34.0)
MCHC: 31.8 g/dL (ref 30.0–36.0)
MCV: 111.5 fL — ABNORMAL HIGH (ref 80.0–100.0)
Platelets: 217 10*3/uL (ref 150–400)
RBC: 4.01 MIL/uL (ref 3.87–5.11)
RDW: 13.2 % (ref 11.5–15.5)
WBC: 6.7 10*3/uL (ref 4.0–10.5)
nRBC: 0 % (ref 0.0–0.2)

## 2023-09-04 MED ORDER — LACTATED RINGERS IV BOLUS
250.0000 mL | Freq: Once | INTRAVENOUS | Status: AC
  Administered 2023-09-04: 250 mL via INTRAVENOUS

## 2023-09-04 MED ORDER — QUETIAPINE FUMARATE 25 MG PO TABS: 12.5000 mg | ORAL_TABLET | Freq: Two times a day (BID) | ORAL | Status: DC

## 2023-09-04 MED ORDER — METOPROLOL TARTRATE 25 MG PO TABS
25.0000 mg | ORAL_TABLET | ORAL | Status: DC | PRN
  Administered 2023-09-04: 25 mg via ORAL
  Filled 2023-09-04: qty 1

## 2023-09-04 MED ORDER — POTASSIUM CHLORIDE CRYS ER 20 MEQ PO TBCR
40.0000 meq | EXTENDED_RELEASE_TABLET | ORAL | Status: AC
Start: 2023-09-04 — End: 2023-09-04
  Administered 2023-09-04 (×2): 40 meq via ORAL
  Filled 2023-09-04 (×2): qty 2

## 2023-09-04 MED ORDER — METOPROLOL TARTRATE 5 MG/5ML IV SOLN: 5.0000 mg | INTRAVENOUS | Status: DC | PRN

## 2023-09-04 MED ORDER — METOPROLOL TARTRATE 25 MG PO TABS: 25.0000 mg | ORAL_TABLET | ORAL | 11 refills | Status: DC | PRN

## 2023-09-04 NOTE — TOC Transition Note (Addendum)
 Transition of Care [facility]) - Discharge Note   Patient Details  Name: Rose Bridges MRN: [id number] Date of Birth: 1961/04/28  Transition [facility]) CM/SW Contact:  Ruben Corolla, RN Phone Number: 09/04/2023, 9:35 AM   Clinical Narrative: waiting for response from [name] if able to accept back to LTC. I have contacted Emily(ex dtr in law) for her to asst if facility will accept back. they were concerned about the multiple psych interventions(IVC at least 3). Per attending stable to return.  -9:44a [name] able to accept back per rep Daril Edge from [facility]. Awaiting d/c summary prior rm,tel#,then [name]. -10a Per nsg new s/s MD to eval.  -1:55p-Going to [name] rm#[id number], report #[phone]. [name] called. No further CM needs.    Final next level of care: Long Term Nursing Home Barriers to Discharge: No Barriers Identified   Patient Goals and CMS Choice Patient states their goals for this hospitalization and ongoing recovery are:: Return back to [facility] LTC CMS Medicare.gov Compare Post Acute Care list provided to:: Patient Represenative (must comment) Choice offered to / list presented to : Adult Children [place] ownership interest in [facility].provided to:: Adult Children    Discharge Placement                       Discharge Plan and Services Additional resources added to the After Visit Summary for     Discharge Planning Services: CM Consult Post Acute Care Choice: Skilled Nursing Facility                               Social Drivers of Health (SDOH) Interventions SDOH Screenings   Food Insecurity: No Food Insecurity (09/02/2023)  Housing: Unknown (09/02/2023)  Transportation Needs: No Transportation Needs (09/02/2023)  Utilities: Not At Risk (09/02/2023)  Depression (PHQ2-9): Medium Risk (09/09/2019)  Tobacco Use: Medium Risk (09/03/2023)     Readmission Risk Interventions    04/11/2023   11:14 AM  Readmission Risk Prevention Plan   Transportation Screening Complete  PCP or Specialist Appt within 3-5 Days Complete  HRI or Home Care Consult Complete  Social Work Consult for Recovery Care Planning/Counseling Complete  Palliative Care Screening Complete  Medication Review Oceanographer) Complete

## 2023-09-04 NOTE — NC FL2 (Signed)
 [place]  MEDICAID FL2 LEVEL OF CARE FORM     IDENTIFICATION  Patient Name: Rose Bridges Birthdate: Nov 17, 1960 Sex: female Admission Date (Current Location): 09/01/2023  [facility] and [place] Number:  Producer, television/film/video and Address:  [facility],  [address], [place] [address]      Provider Number: [id number]  Attending Physician Name and Address:  Rose Rosa, MD  Relative Name and Phone Number:  Rose Bridges [id number]([phone]    Current Level of Care: Hospital Recommended Level of Care: Nursing Facility Prior Approval Number:    Date Approved/Denied:   PASRR Number:    Discharge Plan: Other (Comment) (LTC)    Current Diagnoses: Patient Active Problem List   Diagnosis Date Noted   Acute metabolic encephalopathy 09/02/2023   Dehydration 09/02/2023   Acute prerenal azotemia 09/02/2023   Hypomagnesemia 09/02/2023   Prolonged QT interval 09/02/2023   History of bipolar disorder 09/02/2023   History of seizures 09/02/2023   GERD (gastroesophageal reflux disease) 09/02/2023   Agitation 09/01/2023   Major depressive disorder 05/30/2023   Medically noncompliant 05/29/2023   Acute encephalopathy 04/07/2023   AKI (acute kidney injury) (HCC) 04/07/2023   Acute cystitis 04/07/2023   Depression with anxiety 04/07/2023   Chronic pain 04/07/2023   Epilepsy (HCC)    Alcohol dependence (HCC) 05/22/2022   Delirium due to another medical condition, acute, hyperactive 05/18/2022   Acute pancreatitis without infection or necrosis 05/14/2022   Abnormal findings on imaging of biliary tract 05/14/2022   AMS (altered mental status) 05/10/2022   Pelvic fracture (HCC) 04/03/2022   Tobacco use 04/03/2022   Osteogenesis imperfecta 04/28/2018   Seizure (HCC) 03/26/2017    Orientation RESPIRATION BLADDER Height & Weight     Self, Time, Situation, Place  Normal Incontinent Weight: 57 kg Height:  5\' 4"  (162.6 cm)  BEHAVIORAL SYMPTOMS/MOOD NEUROLOGICAL BOWEL  NUTRITION STATUS      Incontinent Diet (Regular)  AMBULATORY STATUS COMMUNICATION OF NEEDS Skin   Extensive Assist Verbally Normal                       Personal Care Assistance Level of Assistance  Bathing, Feeding, Dressing Bathing Assistance: Maximum assistance Feeding assistance: Maximum assistance Dressing Assistance: Maximum assistance     Functional Limitations Info  Sight, Hearing, Speech Sight Info: Impaired (eyeglasses) Hearing Info: Adequate Speech Info: Adequate    SPECIAL CARE FACTORS FREQUENCY                       Contractures Contractures Info: Not present    Additional Factors Info  Code Status, Allergies Code Status Info: Full Allergies Info: Codeine           Current Medications (09/04/2023):  This is the current hospital active medication list Current Facility-Administered Medications  Medication Dose Route Frequency Provider Last Rate Last Admin   acetaminophen  (TYLENOL ) tablet 1,000 mg  1,000 mg Oral TID Vann, Jessica U, DO   1,000 mg at 09/04/23 8469   aspirin  chewable tablet 81 mg  81 mg Oral Daily Lind Repine, MD   81 mg at 09/04/23 6295   gabapentin  (NEURONTIN ) capsule 300 mg  300 mg Oral TID Lind Repine, MD   300 mg at 09/04/23 0948   levETIRAcetam  (KEPPRA ) tablet 500 mg  500 mg Oral BID Lind Repine, MD   500 mg at 09/04/23 0948   ondansetron  (ZOFRAN ) injection 4 mg  4 mg Intravenous Q6H PRN Howerter, Justin B,  DO       oxyCODONE  (Oxy IR/ROXICODONE ) immediate release tablet 5 mg  5 mg Oral Q6H PRN Lind Repine, MD   5 mg at 09/04/23 0327   pantoprazole  (PROTONIX ) EC tablet 40 mg  40 mg Oral Daily Lind Repine, MD   40 mg at 09/04/23 0948   piperacillin-tazobactam (ZOSYN) IVPB 3.375 g  3.375 g Intravenous Q8H Vann, Jessica U, DO 12.5 mL/hr at 09/04/23 0517 3.375 g at 09/04/23 0517   potassium chloride  SA (KLOR-CON  M) CR tablet 40 mEq  40 mEq Oral Q4H Yates, Jennifer, MD   40 mEq at 09/04/23 0948   QUEtiapine (SEROQUEL)  tablet 12.5 mg  12.5 mg Oral 2 times per day Onuoha, Josephine C, NP   12.5 mg at 09/01/23 2039     Discharge Medications: Please see discharge summary for a list of discharge medications.  Relevant Imaging Results:  Relevant Lab Results:   Additional Information SS#[id number] [phone]  Rose Bridges, Rose Flax, RN

## 2023-09-04 NOTE — Progress Notes (Signed)
 Patient had episode of ST in the 150's spanning about 20 minutes. BP started at 130 systolic and dropped to 90's. Patient was asleep at onset and when awake did not say she felt dizzy or lightheaded. Remained confused and agitated. Rapid RN assessed and orders received for 25 PO metop and 250 LR bolus. Pt HR now improved to 80's/90's at this time. Of note pt has very poor oral intake.

## 2023-09-04 NOTE — Discharge Summary (Addendum)
 Physician Discharge Summary   Patient: Rose Bridges MRN: [id number] DOB: [birth year]  Admit date:     09/01/2023  Discharge date: 09/04/23  Discharge Physician: Lorita Rosa   PCP: [facility] [name]   Recommendations at discharge:   You are being discharged back to [facility] Urine culture grew Pseudomonas, but the patient was already improved prior to starting antibiotics and so this is unlikely to be the cause of her AMS For now, hold antibiotics; if AMS recurs and/or she develops urinary symptoms, treatment is recommended with PO ciproflaxacin You are being referred to neurology Take metoprolol  prn SVT  Discharge Diagnoses: Principal Problem:   Acute metabolic encephalopathy Active Problems:   Osteogenesis imperfecta   Acute cystitis   History of bipolar disorder   History of seizures   GERD (gastroesophageal reflux disease)   Hospital Course: 63yo with h/o bipolar d/o, osteogenesis imperfecta, chronic pain, and seizure d/o who presented on 5/4 with AMS associated with Pseudomonas UTI.  She is a LTC SNF resident and is medically stable for discharge back to [facility] at this time.  Assessment and Plan:   Acute metabolic encephalopathy Possibly related to UTI However, she has no reported symptoms other than AMS and improved prior to starting antibiotics for Pseudomonas Could also be related to dehydration, which is resolved Regardless, she is improving and is stable for return to [facility] today  Acute cystitis vs. Asymptomatic bacteriuria IV abx-changed to Zosyn on 5/6 as culture showing Pseudomonas Mental status appears improved as of 5/6 It is sensitive to Cipro, although this is suboptimal with her baseline behavioral issues For now, I would favor discharging without treatment and monitoring If AMS or urinary symptoms develop, a course of PO Cipro is reasonable  SVT Periodic sinus tachycardia, happened just prior to dc today Will order prn PO  metoprolol  If HR normalizes, can still dc Otherwise, she may need an additional night of monitoring    Bipolar disorder Psych consult appreciated IVC rescinded by psychiatry Continue Cymbalta  and started on Seroquel   History of seizures Without clinical evidence to suggest active seizures at this time Continue Keppra  and Depakote -- unclear if she has been compliant with this medication Outpatient referral to neurology is recommended by psychiatry with consideration of new MRI    GERD Continue PPI  Osteogenesis imperfecta Minimal LE mobility Continue Biofreeze, calcitonin NS, calcium  carbonate, Celebrex, gabapentin   GOC Her daughter is considering DNR but wants to speak with her first Will leave full code for now     Consultants: Psychiatry Palliative care PT [facility] team  Procedures: None  Antibiotics: Ceftriaxone  x 1  30 Day Unplanned Readmission Risk Score    Flowsheet Row ED to Hosp-Admission (Current) from 09/01/2023 in [place] 4TH FLOOR PROGRESSIVE CARE AND UROLOGY  30 Day Unplanned Readmission Risk Score (%) 21.26 Filed at 09/04/2023 0801       This score is the patient's risk of an unplanned readmission within 30 days of being discharged (0 -100%). The score is based on dignosis, age, lab data, medications, orders, and past utilization.   Low:  0-14.9   Medium: 15-21.9   High: 22-29.9   Extreme: 30 and above           Pain control - [place]  Controlled Substance Reporting System database was reviewed. and patient was instructed, not to drive, operate heavy machinery, perform activities at heights, swimming or participation in water activities or provide baby-sitting services while on Pain, Sleep and Anxiety Medications; until  their outpatient Physician has advised to do so again. Also recommended to not to take more than prescribed Pain, Sleep and Anxiety Medications.   Disposition: Long term care facility Diet recommendation:  Regular  diet DISCHARGE MEDICATION: Allergies as of 09/04/2023       Reactions   Codeine Other (See Comments)   Migraines        Medication List     TAKE these medications    acetaminophen  500 MG tablet Commonly known as: TYLENOL  Take 1,000 mg by mouth 3 (three) times daily.   aspirin  81 MG chewable tablet Chew 81 mg by mouth daily.   Biofreeze Cool The Pain 4 % Gel Generic drug: Menthol  (Topical Analgesic) Apply 1 application  topically See admin instructions. Apply to both hands and knees 2 times a day   bisacodyl  10 MG suppository Commonly known as: DULCOLAX Place 10 mg rectally daily as needed (for constipation).   calcitonin (salmon) 200 UNIT/ACT nasal spray Commonly known as: MIACALCIN /FORTICAL Place 1 spray into alternate nostrils daily. What changed:  how to take this when to take this additional instructions   calcium  carbonate 500 MG chewable tablet Commonly known as: TUMS - dosed in mg elemental calcium  Chew 1 tablet by mouth 2 (two) times daily.   celecoxib 50 MG capsule Commonly known as: CELEBREX Take 50 mg by mouth daily.   Depakote ER 250 MG 24 hr tablet Generic drug: divalproex Take 250 mg by mouth at bedtime.   DULoxetine  30 MG capsule Commonly known as: CYMBALTA  Take 1 capsule (30 mg total) by mouth daily. What changed: when to take this   FIBER PO Take 0.4 g by mouth 3 (three) times daily between meals.   gabapentin  300 MG capsule Commonly known as: NEURONTIN  Take 1 capsule (300 mg total) by mouth 3 (three) times daily.   levETIRAcetam  500 MG tablet Commonly known as: KEPPRA  Take 1 tablet (500 mg total) by mouth 2 (two) times daily.   LORazepam  0.5 MG tablet Commonly known as: ATIVAN  Take 0.5 mg by mouth every 6 (six) hours as needed for anxiety.   magnesium  oxide 400 (240 Mg) MG tablet Commonly known as: MAG-OX Take 400 mg by mouth daily.   metoprolol  tartrate 25 MG tablet Commonly known as: LOPRESSOR  Take 1 tablet (25 mg total)  by mouth every hour as needed (HR >120; hold for SBP <100).   oxyCODONE  5 MG immediate release tablet Commonly known as: Oxy IR/ROXICODONE  Take 5 mg by mouth every 8 (eight) hours as needed (for pain).   pantoprazole  40 MG tablet Commonly known as: PROTONIX  Take 40 mg by mouth daily before breakfast.   polyethylene glycol powder 17 GM/SCOOP powder Commonly known as: GLYCOLAX /MIRALAX  Take 17 g by mouth daily as needed (to aid the bowels- mix into 4 to 6 ounces of fluid).   QUEtiapine 25 MG tablet Commonly known as: SEROQUEL Take 0.5 tablets (12.5 mg total) by mouth 2 (two) times daily.   senna 8.6 MG Tabs tablet Commonly known as: SENOKOT Take 1 tablet by mouth daily as needed for mild constipation.   Vitamin D  (Ergocalciferol ) 1.25 MG (50000 UNIT) Caps capsule Commonly known as: DRISDOL  Take 1 capsule (50,000 Units total) by mouth every 7 (seven) days. What changed: when to take this   Voltaren 1 % Gel Generic drug: diclofenac Sodium Apply 2 g topically See admin instructions. Apply 2 grams to the right knee 2 times a day        Contact information for after-discharge  care     Destination     [facility], [facility] Preferred SNF .   Service: Skilled Nursing Contact information: [address] [place]  [phone]                    Discharge Exam:    Subjective: Patient reports subacute leg pain, poor mobility.  PT has evaluated and is recommending return to LTC without therapy services.  I spoke with her daughter - she often refuses medications.  She had violent episodes on Sunday there, none here.   Objective: Vitals:   09/04/23 0455 09/04/23 1709  BP: 134/69 130/89  Pulse: 89   Resp: 18 18  Temp: 98 F (36.7 C)   SpO2: 98% 94%    Intake/Output Summary (Last 24 hours) at 09/04/2023 1722 Last data filed at 09/04/2023 0300 Gross per 24 hour  Intake 160 ml  Output --  Net 160 ml   Filed Weights    09/02/23 0110 09/03/23 0500 09/04/23 0500  Weight: 53.8 kg 56.6 kg 57 kg    Exam:  General:  Appears calm and comfortable and is in NAD Eyes:   normal lids, iris ENT:  grossly normal hearing, lips & tongue, mmm Cardiovascular:  RRR, no m/r/g. No LE edema.  Respiratory:   CTA bilaterally with no wheezes/rales/rhonchi.  Normal respiratory effort. Abdomen:  soft, NT, ND Skin:  no rash or induration seen on limited exam Musculoskeletal:  significant BLE > BUE atrophy Psychiatric:  cantankerous mood and affect, speech fluent and appropriate, AOx3 Neurologic:  CN 2-12 grossly intact, moves all extremities in coordinated fashion  Data Reviewed: I have reviewed the patient's lab results since admission.  Pertinent labs for today include:  K+ 3.0, repleting Stable CBC     Condition at discharge: fair  The results of significant diagnostics from this hospitalization (including imaging, microbiology, ancillary and laboratory) are listed below for reference.   Imaging Studies: CT Angio Chest PE W and/or Wo Contrast Result Date: 09/01/2023 CLINICAL DATA:  Concern for pulmonary embolism. Also abdominal pain. EXAM: CT ANGIOGRAPHY CHEST CT ABDOMEN AND PELVIS WITH CONTRAST TECHNIQUE: Multidetector CT imaging of the chest was performed using the standard protocol during bolus administration of intravenous contrast. Multiplanar CT image reconstructions and MIPs were obtained to evaluate the vascular anatomy. Multidetector CT imaging of the abdomen and pelvis was performed using the standard protocol during bolus administration of intravenous contrast. RADIATION DOSE REDUCTION: This exam was performed according to the departmental dose-optimization program which includes automated exposure control, adjustment of the mA and/or kV according to patient size and/or use of iterative reconstruction technique. CONTRAST:  OMNIPAQUE  IOHEXOL  350 MG/ML SOLN COMPARISON:  Chest radiograph dated 05/17/2023.  FINDINGS: Evaluation of this exam is limited due to respiratory motion. CTA CHEST FINDINGS Cardiovascular: There is no cardiomegaly. Trace pericardial effusion. Three-vessel coronary vascular calcification. The thoracic aorta is unremarkable. The origins of the great vessels of the aortic arch are patent. No pulmonary artery embolus identified. Mediastinum/Nodes: No hilar or mediastinal adenopathy. The esophagus is grossly unremarkable no mediastinal fluid collection. No mediastinal fluid collection. Lungs/Pleura: Bilateral lower lobe linear atelectasis/scarring. No focal consolidation, pleural effusion, or pneumothorax. The central airways are patent. Musculoskeletal: Osteopenia.  No acute osseous pathology. Review of the MIP images confirms the above findings. CT ABDOMEN and PELVIS FINDINGS No intra-abdominal free air or free fluid. Hepatobiliary: The liver is unremarkable. Mild dilatation. No calcified gallstone. Focal adenomyomatosis of the gallbladder fundus. Pancreas: Unremarkable.  No pancreatic ductal dilatation or surrounding inflammatory changes. Spleen: Normal in size without focal abnormality. Adrenals/Urinary Tract: The adrenal glands unremarkable. There is no hydronephrosis on either side. The visualized ureters and urinary bladder appear unremarkable. Stomach/Bowel: There is normal obstruction. The appendix is normal. Several duodenal diverticula measure up to 4 cm. Vascular/Lymphatic: Mild aortoiliac atherosclerotic disease. The IVC is unremarkable no portal gas. There is no adenopathy. Reproductive: Hysterectomy. Other: None Musculoskeletal: Left hip arthroplasty with associated streak artifact. Osteopenia with degenerative changes of the spine. No acute osseous pathology. Review of the MIP images confirms the above findings. IMPRESSION: 1. No acute intrathoracic, abdominal, or pelvic pathology. No CT evidence of pulmonary artery embolus. 2. Three-vessel coronary vascular calcification. 3.  Aortic  Atherosclerosis (ICD10-I70.0). Electronically Signed   By: Angus Bark M.D.   On: 09/01/2023 20:49   CT ABDOMEN PELVIS W CONTRAST Result Date: 09/01/2023 CLINICAL DATA:  Concern for pulmonary embolism. Also abdominal pain. EXAM: CT ANGIOGRAPHY CHEST CT ABDOMEN AND PELVIS WITH CONTRAST TECHNIQUE: Multidetector CT imaging of the chest was performed using the standard protocol during bolus administration of intravenous contrast. Multiplanar CT image reconstructions and MIPs were obtained to evaluate the vascular anatomy. Multidetector CT imaging of the abdomen and pelvis was performed using the standard protocol during bolus administration of intravenous contrast. RADIATION DOSE REDUCTION: This exam was performed according to the departmental dose-optimization program which includes automated exposure control, adjustment of the mA and/or kV according to patient size and/or use of iterative reconstruction technique. CONTRAST:  OMNIPAQUE  IOHEXOL  350 MG/ML SOLN COMPARISON:  Chest radiograph dated 05/17/2023. FINDINGS: Evaluation of this exam is limited due to respiratory motion. CTA CHEST FINDINGS Cardiovascular: There is no cardiomegaly. Trace pericardial effusion. Three-vessel coronary vascular calcification. The thoracic aorta is unremarkable. The origins of the great vessels of the aortic arch are patent. No pulmonary artery embolus identified. Mediastinum/Nodes: No hilar or mediastinal adenopathy. The esophagus is grossly unremarkable no mediastinal fluid collection. No mediastinal fluid collection. Lungs/Pleura: Bilateral lower lobe linear atelectasis/scarring. No focal consolidation, pleural effusion, or pneumothorax. The central airways are patent. Musculoskeletal: Osteopenia.  No acute osseous pathology. Review of the MIP images confirms the above findings. CT ABDOMEN and PELVIS FINDINGS No intra-abdominal free air or free fluid. Hepatobiliary: The liver is unremarkable. Mild dilatation. No calcified  gallstone. Focal adenomyomatosis of the gallbladder fundus. Pancreas: Unremarkable. No pancreatic ductal dilatation or surrounding inflammatory changes. Spleen: Normal in size without focal abnormality. Adrenals/Urinary Tract: The adrenal glands unremarkable. There is no hydronephrosis on either side. The visualized ureters and urinary bladder appear unremarkable. Stomach/Bowel: There is normal obstruction. The appendix is normal. Several duodenal diverticula measure up to 4 cm. Vascular/Lymphatic: Mild aortoiliac atherosclerotic disease. The IVC is unremarkable no portal gas. There is no adenopathy. Reproductive: Hysterectomy. Other: None Musculoskeletal: Left hip arthroplasty with associated streak artifact. Osteopenia with degenerative changes of the spine. No acute osseous pathology. Review of the MIP images confirms the above findings. IMPRESSION: 1. No acute intrathoracic, abdominal, or pelvic pathology. No CT evidence of pulmonary artery embolus. 2. Three-vessel coronary vascular calcification. 3.  Aortic Atherosclerosis (ICD10-I70.0). Electronically Signed   By: Angus Bark M.D.   On: 09/01/2023 20:49   CT HEAD WO CONTRAST ( ) Result Date: 09/01/2023 CLINICAL DATA:  Altered mental status EXAM: CT HEAD WITHOUT CONTRAST TECHNIQUE: Contiguous axial images were obtained from the base of the skull through the vertex without intravenous contrast. RADIATION DOSE REDUCTION: This exam was performed according to the departmental dose-optimization program which includes automated  exposure control, adjustment of the mA and/or kV according to patient size and/or use of iterative reconstruction technique. COMPARISON:  05/28/2023 FINDINGS: Brain: No evidence of acute infarction, hemorrhage, hydrocephalus, extra-axial collection or mass lesion/mass effect. Mild chronic white matter ischemic changes are again noted. 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 Vascular: No hyperdense vessel or unexpected calcification. Skull: Normal. Negative for fracture or focal lesion. Sinuses/Orbits: No acute finding. Other: None. IMPRESSION: Chronic changes without acute abnormality. Electronically Signed   By: Violeta Grey M.D.   On: 09/01/2023 20:39    Microbiology: Results for orders placed or performed during the hospital encounter of 09/01/23  Urine Culture     Status: Abnormal   Collection Time: 09/01/23  4:52 PM   Specimen: Urine, Catheterized  Result Value Ref Range Status   Specimen Description   Final    URINE, CATHETERIZED Performed at [facility], [address], [place], [place] [address]    Special Requests   Final    NONE Performed at [facility], [address], [place], [place] [address]    Culture 20,000 COLONIES/mL PSEUDOMONAS AERUGINOSA (A)  Final   Report Status 09/03/2023 FINAL  Final   Organism ID, Bacteria PSEUDOMONAS AERUGINOSA (A)  Final      Susceptibility   Pseudomonas aeruginosa - MIC*    CEFTAZIDIME 4 SENSITIVE Sensitive     CIPROFLOXACIN <=0.25 SENSITIVE Sensitive     GENTAMICIN 4 SENSITIVE Sensitive     IMIPENEM 2 SENSITIVE Sensitive     PIP/TAZO <=4 SENSITIVE Sensitive ug/mL    CEFEPIME 2 SENSITIVE Sensitive     * 20,000 COLONIES/mL PSEUDOMONAS AERUGINOSA  Blood culture (routine x 2)     Status: Abnormal   Collection Time: 09/01/23  9:33 PM   Specimen: BLOOD  Result Value Ref Range Status   Specimen Description   Final    BLOOD BLOOD LEFT FOREARM Performed at [facility], [address] [place], [place] [address]    Special Requests   Final    BOTTLES DRAWN AEROBIC AND ANAEROBIC Blood Culture adequate volume Performed at [facility], [address], [place], [place] [address]    Culture  Setup Time   Final    GRAM POSITIVE COCCI IN CLUSTERS AEROBIC BOTTLE ONLY CRITICAL RESULT  CALLED TO, READ BACK BY AND VERIFIED WITH: PHARMD M LILLISTON 09/02/2023 @ 0000 BY AB    Culture (A)  Final    STAPHYLOCOCCUS HOMINIS THE SIGNIFICANCE OF ISOLATING THIS ORGANISM FROM A SINGLE SET OF BLOOD CULTURES WHEN MULTIPLE SETS ARE DRAWN IS UNCERTAIN. PLEASE NOTIFY THE MICROBIOLOGY DEPARTMENT WITHIN ONE WEEK IF SPECIATION AND SENSITIVITIES ARE REQUIRED. Performed at [facility], [address] [place], [place] [address]    Report Status 09/03/2023 FINAL  Final  Blood Culture ID Panel (Reflexed)     Status: Abnormal   Collection Time: 09/01/23  9:33 PM  Result Value Ref Range Status   Enterococcus faecalis NOT DETECTED NOT DETECTED Final   Enterococcus Faecium NOT DETECTED NOT DETECTED Final   Listeria monocytogenes NOT DETECTED NOT DETECTED Final   Staphylococcus species DETECTED (A) NOT DETECTED Final    Comment: CRITICAL RESULT CALLED TO, READ BACK BY AND VERIFIED WITH: PHARMD M LILLISTON 09/02/2023 @ 0000 BY AB    Staphylococcus aureus (BCID) NOT DETECTED NOT DETECTED Final   Staphylococcus epidermidis  NOT DETECTED NOT DETECTED Final   Staphylococcus lugdunensis NOT DETECTED NOT DETECTED Final   Streptococcus species NOT DETECTED NOT DETECTED Final   Streptococcus agalactiae NOT DETECTED NOT DETECTED Final   Streptococcus pneumoniae NOT DETECTED NOT DETECTED Final   Streptococcus pyogenes NOT DETECTED NOT DETECTED Final   A.calcoaceticus-baumannii NOT DETECTED NOT DETECTED Final   Bacteroides fragilis NOT DETECTED NOT DETECTED Final   Enterobacterales NOT DETECTED NOT DETECTED Final   Enterobacter cloacae complex NOT DETECTED NOT DETECTED Final   Escherichia coli NOT DETECTED NOT DETECTED Final   Klebsiella aerogenes NOT DETECTED NOT DETECTED Final   Klebsiella oxytoca NOT DETECTED NOT DETECTED Final   Klebsiella pneumoniae NOT DETECTED NOT DETECTED Final   Proteus species NOT DETECTED NOT DETECTED Final   Salmonella species NOT DETECTED NOT DETECTED Final   Serratia  marcescens NOT DETECTED NOT DETECTED Final   Haemophilus influenzae NOT DETECTED NOT DETECTED Final   Neisseria meningitidis NOT DETECTED NOT DETECTED Final   Pseudomonas aeruginosa NOT DETECTED NOT DETECTED Final   Stenotrophomonas maltophilia NOT DETECTED NOT DETECTED Final   Candida albicans NOT DETECTED NOT DETECTED Final   Candida auris NOT DETECTED NOT DETECTED Final   Candida glabrata NOT DETECTED NOT DETECTED Final   Candida krusei NOT DETECTED NOT DETECTED Final   Candida parapsilosis NOT DETECTED NOT DETECTED Final   Candida tropicalis NOT DETECTED NOT DETECTED Final   Cryptococcus neoformans/gattii NOT DETECTED NOT DETECTED Final    Comment: Performed at [facility], [address], [place], [place] [address]  Blood culture (routine x 2)     Status: None (Preliminary result)   Collection Time: 09/02/23  3:41 AM   Specimen: BLOOD RIGHT ARM  Result Value Ref Range Status   Specimen Description   Final    BLOOD RIGHT ARM Performed at [facility], [address], [place], [place] [address]    Special Requests   Final    BOTTLES DRAWN AEROBIC ONLY Blood Culture results may not be optimal due to an inadequate volume of blood received in culture bottles Performed at [facility], [address], [place], [place] [address]    Culture   Final    NO GROWTH 2 DAYS Performed at [facility], [address], [place], [place] [address]    Report Status PENDING  Incomplete    Labs: CBC: Recent Labs  Lab 09/01/23 1323 09/02/23 0341 09/04/23 0526  WBC 9.9 7.7 6.7  NEUTROABS 6.5 4.5  --   HGB 14.7 13.8 14.2  HCT 43.4 43.4 44.7  MCV 105.1* 111.0* 111.5*  PLT 239 200 217   Basic Metabolic Panel: Recent Labs  Lab 09/01/23 1323 09/02/23 0341 09/03/23 0923 09/04/23 0526  NA 138 140  --  135  K 3.5 3.2*  --  3.0*  CL 105 107  --  101  CO2 23 21*  --  23  GLUCOSE 78 94  --  74  BUN 11 7*  --  <5*  CREATININE 0.50 0.47  --  0.36*   CALCIUM  7.9* 8.0*  --  8.1*  MG 1.3*  --  2.1  --    Liver Function Tests: Recent Labs  Lab 09/01/23 1323 09/02/23 0341  AST 33 35  ALT 16 17  ALKPHOS 69 71  BILITOT 1.2 0.8  PROT 5.1* 5.5*  ALBUMIN 2.3* 2.4*   CBG: No results for input(s): "GLUCAP" in the last 168 hours.  Discharge time spent: greater  than 30 minutes.  Signed: Lorita Rosa, MD [facility] 09/04/2023

## 2023-09-04 NOTE — Hospital Course (Addendum)
 63yo with h/o bipolar d/o, osteogenesis imperfecta, chronic pain, and seizure d/o who presented on 5/4 with AMS associated with Pseudomonas UTI.  She is a LTC SNF resident and is medically stable for discharge back to [facility] at this time.

## 2023-09-05 DIAGNOSIS — G9341 Metabolic encephalopathy: Secondary | ICD-10-CM | POA: Diagnosis not present

## 2023-09-05 DIAGNOSIS — Z515 Encounter for palliative care: Secondary | ICD-10-CM

## 2023-09-05 LAB — CBC WITH DIFFERENTIAL/PLATELET
Abs Immature Granulocytes: 0.07 10*3/uL (ref 0.00–0.07)
Basophils Absolute: 0 10*3/uL (ref 0.0–0.1)
Basophils Relative: 1 %
Eosinophils Absolute: 0.3 10*3/uL (ref 0.0–0.5)
Eosinophils Relative: 4 %
HCT: 44 % (ref 36.0–46.0)
Hemoglobin: 14.4 g/dL (ref 12.0–15.0)
Immature Granulocytes: 1 %
Lymphocytes Relative: 34 %
Lymphs Abs: 2.2 10*3/uL (ref 0.7–4.0)
MCH: 35.5 pg — ABNORMAL HIGH (ref 26.0–34.0)
MCHC: 32.7 g/dL (ref 30.0–36.0)
MCV: 108.4 fL — ABNORMAL HIGH (ref 80.0–100.0)
Monocytes Absolute: 0.7 10*3/uL (ref 0.1–1.0)
Monocytes Relative: 10 %
Neutro Abs: 3.2 10*3/uL (ref 1.7–7.7)
Neutrophils Relative %: 50 %
Platelets: 212 10*3/uL (ref 150–400)
RBC: 4.06 MIL/uL (ref 3.87–5.11)
RDW: 12.9 % (ref 11.5–15.5)
WBC: 6.4 10*3/uL (ref 4.0–10.5)
nRBC: 0 % (ref 0.0–0.2)

## 2023-09-05 LAB — BASIC METABOLIC PANEL WITH GFR
Anion gap: 11 (ref 5–15)
BUN: <5 mg/dL — ABNORMAL LOW (ref 8–23)
CO2: 24 mmol/L (ref 22–32)
Calcium: 8.4 mg/dL — ABNORMAL LOW (ref 8.9–10.3)
Chloride: 103 mmol/L (ref 98–111)
Creatinine, Ser: 0.39 mg/dL — ABNORMAL LOW (ref 0.44–1.00)
GFR, Estimated: >60 mL/min (ref >60)
Glucose, Bld: 68 mg/dL — ABNORMAL LOW (ref 70–99)
Potassium: 4.5 mmol/L (ref 3.5–5.1)
Sodium: 138 mmol/L (ref 135–145)

## 2023-09-05 LAB — GLUCOSE, CAPILLARY
Glucose-Capillary: 137 mg/dL — ABNORMAL HIGH (ref 70–99)
Glucose-Capillary: 51 mg/dL — ABNORMAL LOW (ref 70–99)
Glucose-Capillary: 138 mg/dL — ABNORMAL HIGH (ref 70–99)

## 2023-09-05 MED ORDER — DEXTROSE 50 % IV SOLN
INTRAVENOUS | Status: AC
  Filled 2023-09-05: qty 50

## 2023-09-05 MED ORDER — DEXTROSE 50 % IV SOLN
25.0000 g | INTRAVENOUS | Status: AC
  Administered 2023-09-05: 25 g via INTRAVENOUS

## 2023-09-05 NOTE — Discharge Summary (Signed)
 Physician Discharge Summary   Patient: Rose Bridges MRN: [id number] DOB: 1960/08/06  Admit date:     09/01/2023  Discharge date: 09/05/23  Discharge Physician: Rose Bridges   PCP: [facility], [name]   Recommendations at discharge:   You are being discharged back to [facility] Urine culture grew Pseudomonas, but the patient was already improved prior to starting antibiotics and so this is unlikely to be the cause of her AMS For now, hold antibiotics; if AMS recurs and/or she develops urinary symptoms, treatment is recommended with PO ciproflaxacin You are being referred to neurology Take metoprolol  prn SVT  Discharge Diagnoses: Principal Problem:   Acute metabolic encephalopathy Active Problems:   Osteogenesis imperfecta   Acute cystitis   History of bipolar disorder   History of seizures   GERD (gastroesophageal reflux disease)    Hospital Course: 63yo with h/o bipolar d/o, osteogenesis imperfecta, chronic pain, and seizure d/o who presented on 5/4 with AMS associated with Pseudomonas UTI.  She is a LTC SNF resident and is medically stable for discharge back to [facility] at this time.  Assessment and Plan:  Acute metabolic encephalopathy Possibly related to UTI However, she has no reported symptoms other than AMS and improved prior to starting antibiotics for Pseudomonas Could also be related to dehydration, which is resolved Regardless, she is improving and is stable for return to [facility] today   Acute cystitis vs. Asymptomatic bacteriuria IV abx-changed to Zosyn on 5/6 as culture showing Pseudomonas Mental status appears improved as of 5/6 It is sensitive to Cipro, although this is suboptimal with her baseline behavioral issues For now, I would favor discharging without treatment and monitoring If AMS or urinary symptoms develop, a course of PO Cipro is reasonable   SVT Periodic sinus tachycardia, happened just prior to dc today Will order prn PO  metoprolol  If HR normalizes, can still dc Otherwise, she may need an additional night of monitoring    Bipolar disorder Psych consult appreciated IVC rescinded by psychiatry Continue Cymbalta  and started on Seroquel   History of seizures Without clinical evidence to suggest active seizures at this time Continue Keppra  and Depakote -- unclear if she has been compliant with this medication Outpatient referral to neurology is recommended by psychiatry with consideration of new MRI    GERD Continue PPI   Osteogenesis imperfecta Minimal LE mobility Continue Biofreeze, calcitonin NS, calcium  carbonate, Celebrex, gabapentin    GOC Her daughter is considering DNR but wants to speak with her first Will leave full code for now         Consultants: Psychiatry Palliative care PT TOC team   Procedures: None   Antibiotics: Ceftriaxone  x 1  Pain control - [place]  Controlled Substance Reporting System database was reviewed. and patient was instructed, not to drive, operate heavy machinery, perform activities at heights, swimming or participation in water activities or provide baby-sitting services while on Pain, Sleep and Anxiety Medications; until their outpatient Physician has advised to do so again. Also recommended to not to take more than prescribed Pain, Sleep and Anxiety Medications.   Disposition: Skilled nursing facility Diet recommendation:  Discharge Diet Orders (From admission, onward)     Start     Ordered   09/04/23 0000  Diet general        09/04/23 1343           Regular diet DISCHARGE MEDICATION: Allergies as of 09/05/2023       Reactions   Codeine Other (See Comments)  Migraines        Medication List     TAKE these medications    acetaminophen  500 MG tablet Commonly known as: TYLENOL  Take 1,000 mg by mouth 3 (three) times daily.   aspirin  81 MG chewable tablet Chew 81 mg by mouth daily.   Biofreeze Cool The Pain 4 % Gel Generic  drug: Menthol  (Topical Analgesic) Apply 1 application  topically See admin instructions. Apply to both hands and knees 2 times a day   bisacodyl  10 MG suppository Commonly known as: DULCOLAX Place 10 mg rectally daily as needed (for constipation).   calcitonin (salmon) 200 UNIT/ACT nasal spray Commonly known as: MIACALCIN /FORTICAL Place 1 spray into alternate nostrils daily. What changed:  how to take this when to take this additional instructions   calcium  carbonate 500 MG chewable tablet Commonly known as: TUMS - dosed in mg elemental calcium  Chew 1 tablet by mouth 2 (two) times daily.   celecoxib 50 MG capsule Commonly known as: CELEBREX Take 50 mg by mouth daily.   Depakote ER 250 MG 24 hr tablet Generic drug: divalproex Take 250 mg by mouth at bedtime.   DULoxetine  30 MG capsule Commonly known as: CYMBALTA  Take 1 capsule (30 mg total) by mouth daily. What changed: when to take this   FIBER PO Take 0.4 g by mouth 3 (three) times daily between meals.   gabapentin  300 MG capsule Commonly known as: NEURONTIN  Take 1 capsule (300 mg total) by mouth 3 (three) times daily.   levETIRAcetam  500 MG tablet Commonly known as: KEPPRA  Take 1 tablet (500 mg total) by mouth 2 (two) times daily.   LORazepam  0.5 MG tablet Commonly known as: ATIVAN  Take 0.5 mg by mouth every 6 (six) hours as needed for anxiety.   magnesium  oxide 400 (240 Mg) MG tablet Commonly known as: MAG-OX Take 400 mg by mouth daily.   metoprolol  tartrate 25 MG tablet Commonly known as: LOPRESSOR  Take 1 tablet (25 mg total) by mouth every hour as needed (HR >120; hold for SBP <100).   oxyCODONE  5 MG immediate release tablet Commonly known as: Oxy IR/ROXICODONE  Take 5 mg by mouth every 8 (eight) hours as needed (for pain).   pantoprazole  40 MG tablet Commonly known as: PROTONIX  Take 40 mg by mouth daily before breakfast.   polyethylene glycol powder 17 GM/SCOOP powder Commonly known as:  GLYCOLAX /MIRALAX  Take 17 g by mouth daily as needed (to aid the bowels- mix into 4 to 6 ounces of fluid).   QUEtiapine 25 MG tablet Commonly known as: SEROQUEL Take 0.5 tablets (12.5 mg total) by mouth 2 (two) times daily.   senna 8.6 MG Tabs tablet Commonly known as: SENOKOT Take 1 tablet by mouth daily as needed for mild constipation.   Vitamin D  (Ergocalciferol ) 1.25 MG (50000 UNIT) Caps capsule Commonly known as: DRISDOL  Take 1 capsule (50,000 Units total) by mouth every 7 (seven) days. What changed: when to take this   Voltaren 1 % Gel Generic drug: diclofenac Sodium Apply 2 g topically See admin instructions. Apply 2 grams to the right knee 2 times a day        Contact information for after-discharge care     Destination     [facility] Preferred SNF .   Service: Skilled Nursing Contact information: [address] [place] [place]  [phone]                    Discharge Exam:  Subjective: Patient reports that sinus tachycardia yesterday was because nursing staff was bothering her.  She wants to go home today.   Objective: Vitals:   09/05/23 0514 09/05/23 0913  BP: 126/67 137/87  Pulse: 62 63  Resp: 16 16  Temp: (!) 97.4 F (36.3 C) 97.6 F (36.4 C)  SpO2: 97% 94%    Intake/Output Summary (Last 24 hours) at 09/05/2023 1135 Last data filed at 09/05/2023 0900 Gross per 24 hour  Intake 360.83 ml  Output 1000 ml  Net -639.17 ml   Filed Weights   09/03/23 0500 09/04/23 0500 09/05/23 0450  Weight: 56.6 kg 57 kg 57 kg    Exam:  General:  Appears calm and comfortable and is in NAD Eyes:   normal lids, iris ENT:  grossly normal hearing, lips & tongue, mmm Cardiovascular:  RRR, no m/r/g. No LE edema.  Respiratory:   CTA bilaterally with no wheezes/rales/rhonchi.  Normal respiratory effort. Abdomen:  soft, NT, ND Skin:  no rash or induration seen on limited exam Musculoskeletal:  significant  BLE > BUE atrophy Psychiatric:  cantankerous mood and affect, speech fluent and appropriate, AOx3 Neurologic:  CN 2-12 grossly intact, moves all extremities in coordinated fashion  Data Reviewed: I have reviewed the patient's lab results since admission.  Pertinent labs for today include:  Glucose 68 Unremarkable CBC     Condition at discharge: improving  The results of significant diagnostics from this hospitalization (including imaging, microbiology, ancillary and laboratory) are listed below for reference.   Imaging Studies: CT Angio Chest PE W and/or Wo Contrast Result Date: 09/01/2023 CLINICAL DATA:  Concern for pulmonary embolism. Also abdominal pain. EXAM: CT ANGIOGRAPHY CHEST CT ABDOMEN AND PELVIS WITH CONTRAST TECHNIQUE: Multidetector CT imaging of the chest was performed using the standard protocol during bolus administration of intravenous contrast. Multiplanar CT image reconstructions and MIPs were obtained to evaluate the vascular anatomy. Multidetector CT imaging of the abdomen and pelvis was performed using the standard protocol during bolus administration of intravenous contrast. RADIATION DOSE REDUCTION: This exam was performed according to the departmental dose-optimization program which includes automated exposure control, adjustment of the mA and/or kV according to patient size and/or use of iterative reconstruction technique. CONTRAST:  OMNIPAQUE  IOHEXOL  350 MG/ML SOLN COMPARISON:  Chest radiograph dated 05/17/2023. FINDINGS: Evaluation of this exam is limited due to respiratory motion. CTA CHEST FINDINGS Cardiovascular: There is no cardiomegaly. Trace pericardial effusion. Three-vessel coronary vascular calcification. The thoracic aorta is unremarkable. The origins of the great vessels of the aortic arch are patent. No pulmonary artery embolus identified. Mediastinum/Nodes: No hilar or mediastinal adenopathy. The esophagus is grossly unremarkable no mediastinal fluid  collection. No mediastinal fluid collection. Lungs/Pleura: Bilateral lower lobe linear atelectasis/scarring. No focal consolidation, pleural effusion, or pneumothorax. The central airways are patent. Musculoskeletal: Osteopenia.  No acute osseous pathology. Review of the MIP images confirms the above findings. CT ABDOMEN and PELVIS FINDINGS No intra-abdominal free air or free fluid. Hepatobiliary: The liver is unremarkable. Mild dilatation. No calcified gallstone. Focal adenomyomatosis of the gallbladder fundus. Pancreas: Unremarkable. No pancreatic ductal dilatation or surrounding inflammatory changes. Spleen: Normal in size without focal abnormality. Adrenals/Urinary Tract: The adrenal glands unremarkable. There is no hydronephrosis on either side. The visualized ureters and urinary bladder appear unremarkable. Stomach/Bowel: There is normal obstruction. The appendix is normal. Several duodenal diverticula measure up to 4 cm. Vascular/Lymphatic: Mild aortoiliac atherosclerotic disease. The IVC is unremarkable no portal gas. There is no adenopathy. Reproductive: Hysterectomy. Other: None Musculoskeletal:  Left hip arthroplasty with associated streak artifact. Osteopenia with degenerative changes of the spine. No acute osseous pathology. Review of the MIP images confirms the above findings. IMPRESSION: 1. No acute intrathoracic, abdominal, or pelvic pathology. No CT evidence of pulmonary artery embolus. 2. Three-vessel coronary vascular calcification. 3.  Aortic Atherosclerosis (ICD10-I70.0). Electronically Signed   By: Angus Bark M.D.   On: 09/01/2023 20:49   CT ABDOMEN PELVIS W CONTRAST Result Date: 09/01/2023 CLINICAL DATA:  Concern for pulmonary embolism. Also abdominal pain. EXAM: CT ANGIOGRAPHY CHEST CT ABDOMEN AND PELVIS WITH CONTRAST TECHNIQUE: Multidetector CT imaging of the chest was performed using the standard protocol during bolus administration of intravenous contrast. Multiplanar CT image  reconstructions and MIPs were obtained to evaluate the vascular anatomy. Multidetector CT imaging of the abdomen and pelvis was performed using the standard protocol during bolus administration of intravenous contrast. RADIATION DOSE REDUCTION: This exam was performed according to the departmental dose-optimization program which includes automated exposure control, adjustment of the mA and/or kV according to patient size and/or use of iterative reconstruction technique. CONTRAST:  OMNIPAQUE  IOHEXOL  350 MG/ML SOLN COMPARISON:  Chest radiograph dated 05/17/2023. FINDINGS: Evaluation of this exam is limited due to respiratory motion. CTA CHEST FINDINGS Cardiovascular: There is no cardiomegaly. Trace pericardial effusion. Three-vessel coronary vascular calcification. The thoracic aorta is unremarkable. The origins of the great vessels of the aortic arch are patent. No pulmonary artery embolus identified. Mediastinum/Nodes: No hilar or mediastinal adenopathy. The esophagus is grossly unremarkable no mediastinal fluid collection. No mediastinal fluid collection. Lungs/Pleura: Bilateral lower lobe linear atelectasis/scarring. No focal consolidation, pleural effusion, or pneumothorax. The central airways are patent. Musculoskeletal: Osteopenia.  No acute osseous pathology. Review of the MIP images confirms the above findings. CT ABDOMEN and PELVIS FINDINGS No intra-abdominal free air or free fluid. Hepatobiliary: The liver is unremarkable. Mild dilatation. No calcified gallstone. Focal adenomyomatosis of the gallbladder fundus. Pancreas: Unremarkable. No pancreatic ductal dilatation or surrounding inflammatory changes. Spleen: Normal in size without focal abnormality. Adrenals/Urinary Tract: The adrenal glands unremarkable. There is no hydronephrosis on either side. The visualized ureters and urinary bladder appear unremarkable. Stomach/Bowel: There is normal obstruction. The appendix is normal. Several duodenal  diverticula measure up to 4 cm. Vascular/Lymphatic: Mild aortoiliac atherosclerotic disease. The IVC is unremarkable no portal gas. There is no adenopathy. Reproductive: Hysterectomy. Other: None Musculoskeletal: Left hip arthroplasty with associated streak artifact. Osteopenia with degenerative changes of the spine. No acute osseous pathology. Review of the MIP images confirms the above findings. IMPRESSION: 1. No acute intrathoracic, abdominal, or pelvic pathology. No CT evidence of pulmonary artery embolus. 2. Three-vessel coronary vascular calcification. 3.  Aortic Atherosclerosis (ICD10-I70.0). Electronically Signed   By: Angus Bark M.D.   On: 09/01/2023 20:49   CT HEAD WO CONTRAST ( ) Result Date: 09/01/2023 CLINICAL DATA:  Altered mental status EXAM: CT HEAD WITHOUT CONTRAST TECHNIQUE: Contiguous axial images were obtained from the base of the skull through the vertex without intravenous contrast. RADIATION DOSE REDUCTION: This exam was performed according to the departmental dose-optimization program which includes automated exposure control, adjustment of the mA and/or kV according to patient size and/or use of iterative reconstruction technique. COMPARISON:  05/28/2023 FINDINGS: Brain: No evidence of acute infarction, hemorrhage, hydrocephalus, extra-axial collection or mass lesion/mass effect. Mild chronic white matter ischemic changes are again noted. 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 Vascular: No hyperdense vessel  or unexpected calcification. Skull: Normal. Negative for fracture or focal lesion. Sinuses/Orbits: No acute finding. Other: None. IMPRESSION: Chronic changes without acute abnormality. Electronically Signed   By: Violeta Grey M.D.   On: 09/01/2023 20:39    Microbiology: Results for orders placed or performed during the hospital encounter of 09/01/23  Urine Culture      Status: Abnormal   Collection Time: 09/01/23  4:52 PM   Specimen: Urine, Catheterized  Result Value Ref Range Status   Specimen Description   Final    URINE, CATHETERIZED Performed at [facility], [address] [place], [place] [address]    Special Requests   Final    NONE Performed at [facility], [address] [place], [place] [address]    Culture 20,000 COLONIES/mL PSEUDOMONAS AERUGINOSA (A)  Final   Report Status 09/03/2023 FINAL  Final   Organism ID, Bacteria PSEUDOMONAS AERUGINOSA (A)  Final      Susceptibility   Pseudomonas aeruginosa - MIC*    CEFTAZIDIME 4 SENSITIVE Sensitive     CIPROFLOXACIN <=0.25 SENSITIVE Sensitive     GENTAMICIN 4 SENSITIVE Sensitive     IMIPENEM 2 SENSITIVE Sensitive     PIP/TAZO <=4 SENSITIVE Sensitive ug/mL    CEFEPIME 2 SENSITIVE Sensitive     * 20,000 COLONIES/mL PSEUDOMONAS AERUGINOSA  Blood culture (routine x 2)     Status: Abnormal   Collection Time: 09/01/23  9:33 PM   Specimen: BLOOD  Result Value Ref Range Status   Specimen Description   Final    BLOOD BLOOD LEFT FOREARM Performed at [facility], [address] [place], [place] [address]    Special Requests   Final    BOTTLES DRAWN AEROBIC AND ANAEROBIC Blood Culture adequate volume Performed at [facility] [address], [place], [place] [address]    Culture  Setup Time   Final    GRAM POSITIVE COCCI IN CLUSTERS AEROBIC BOTTLE ONLY CRITICAL RESULT CALLED TO, READ BACK BY AND VERIFIED WITH: PHARMD M LILLISTON 09/02/2023 @ 0000 BY AB    Culture (A)  Final    STAPHYLOCOCCUS HOMINIS THE SIGNIFICANCE OF ISOLATING THIS ORGANISM FROM A SINGLE SET OF BLOOD CULTURES WHEN MULTIPLE SETS ARE DRAWN IS UNCERTAIN. PLEASE NOTIFY THE MICROBIOLOGY DEPARTMENT WITHIN ONE WEEK IF SPECIATION AND SENSITIVITIES ARE REQUIRED. Performed at [facility], [address], [place], [place] [address]    Report  Status 09/03/2023 FINAL  Final  Blood Culture ID Panel (Reflexed)     Status: Abnormal   Collection Time: 09/01/23  9:33 PM  Result Value Ref Range Status   Enterococcus faecalis NOT DETECTED NOT DETECTED Final   Enterococcus Faecium NOT DETECTED NOT DETECTED Final   Listeria monocytogenes NOT DETECTED NOT DETECTED Final   Staphylococcus species DETECTED (A) NOT DETECTED Final    Comment: CRITICAL RESULT CALLED TO, READ BACK BY AND VERIFIED WITH: PHARMD M LILLISTON 09/02/2023 @ 0000 BY AB    Staphylococcus aureus (BCID) NOT DETECTED NOT DETECTED Final   Staphylococcus epidermidis NOT DETECTED NOT DETECTED Final   Staphylococcus lugdunensis NOT DETECTED NOT DETECTED Final   Streptococcus species NOT DETECTED NOT DETECTED Final   Streptococcus agalactiae NOT DETECTED NOT DETECTED Final   Streptococcus pneumoniae NOT DETECTED NOT DETECTED Final   Streptococcus pyogenes NOT DETECTED NOT DETECTED Final   A.calcoaceticus-baumannii NOT DETECTED NOT DETECTED Final   Bacteroides fragilis NOT DETECTED NOT DETECTED Final   Enterobacterales NOT DETECTED NOT DETECTED  Final   Enterobacter cloacae complex NOT DETECTED NOT DETECTED Final   Escherichia coli NOT DETECTED NOT DETECTED Final   Klebsiella aerogenes NOT DETECTED NOT DETECTED Final   Klebsiella oxytoca NOT DETECTED NOT DETECTED Final   Klebsiella pneumoniae NOT DETECTED NOT DETECTED Final   Proteus species NOT DETECTED NOT DETECTED Final   Salmonella species NOT DETECTED NOT DETECTED Final   Serratia marcescens NOT DETECTED NOT DETECTED Final   Haemophilus influenzae NOT DETECTED NOT DETECTED Final   Neisseria meningitidis NOT DETECTED NOT DETECTED Final   Pseudomonas aeruginosa NOT DETECTED NOT DETECTED Final   Stenotrophomonas maltophilia NOT DETECTED NOT DETECTED Final   Candida albicans NOT DETECTED NOT DETECTED Final   Candida auris NOT DETECTED NOT DETECTED Final   Candida glabrata NOT DETECTED NOT DETECTED Final   Candida krusei  NOT DETECTED NOT DETECTED Final   Candida parapsilosis NOT DETECTED NOT DETECTED Final   Candida tropicalis NOT DETECTED NOT DETECTED Final   Cryptococcus neoformans/gattii NOT DETECTED NOT DETECTED Final    Comment: Performed at [facility], [address], [place], [place] [address]  Blood culture (routine x 2)     Status: None (Preliminary result)   Collection Time: 09/02/23  3:41 AM   Specimen: BLOOD RIGHT ARM  Result Value Ref Range Status   Specimen Description   Final    BLOOD RIGHT ARM Performed at [facility], [address] [place], [place] [address]    Special Requests   Final    BOTTLES DRAWN AEROBIC ONLY Blood Culture results may not be optimal due to an inadequate volume of blood received in culture bottles Performed at [facility], [address], [place], [place] [address]    Culture   Final    NO GROWTH 3 DAYS Performed at [facility], [address] [place], [place] [address]    Report Status PENDING  Incomplete    Labs: CBC: Recent Labs  Lab 09/01/23 1323 09/02/23 0341 09/04/23 0526 09/05/23 1019  WBC 9.9 7.7 6.7 6.4  NEUTROABS 6.5 4.5  --  3.2  HGB 14.7 13.8 14.2 14.4  HCT 43.4 43.4 44.7 44.0  MCV 105.1* 111.0* 111.5* 108.4*  PLT 239 200 217 212   Basic Metabolic Panel: Recent Labs  Lab 09/01/23 1323 09/02/23 0341 09/03/23 0923 09/04/23 0526 09/05/23 1019  NA 138 140  --  135 138  K 3.5 3.2*  --  3.0* 4.5  CL 105 107  --  101 103  CO2 23 21*  --  23 24  GLUCOSE 78 94  --  74 68*  BUN 11 7*  --  <5* <5*  CREATININE 0.50 0.47  --  0.36* 0.39*  CALCIUM  7.9* 8.0*  --  8.1* 8.4*  MG 1.3*  --  2.1  --   --    Liver Function Tests: Recent Labs  Lab 09/01/23 1323 09/02/23 0341  AST 33 35  ALT 16 17  ALKPHOS 69 71  BILITOT 1.2 0.8  PROT 5.1* 5.5*  ALBUMIN 2.3* 2.4*   CBG: No results for input(s): "GLUCAP" in the last 168 hours.  Discharge time spent: less than 30 minutes.  Signed: Lorita Rosa, MD [facility] 09/05/2023

## 2023-09-05 NOTE — Progress Notes (Signed)
 [facility] Liaison Note:   This is a current Outpatient Palliative Services patient with [facility].   Liaisons will continue to follow for discharge disposition.   Please call with any OPP related questions or concerns.   Thank you, Ardine Beckwith, [facility] liaison  [phone]

## 2023-09-05 NOTE — Plan of Care (Signed)
  Daily Progress Note   Patient Name: Rose Bridges       Date: 09/05/2023 DOB: 28-Sep-1960  Age: 63 y.o. MRN#: [id number] Attending Physician: Lorita Rosa, MD Primary Care Physician: Place, Mylene Arts Admit Date: 09/01/2023 Length of Stay: 3 days  This palliative medicine provider was contacted by [facility], Ruben Corolla, and RN regarding MOST form for patient. Was contacted when [name] transport currently present at bedside and ready to take patient to [facility]. Informed that patient had arrived to hospital with MOST form and now it cannot be located. Informed that this palliative medicine provider can complete MOST forms though would need time to go speak with patient and review the MOST for as it is an important document discussing wishes for medical care so would not want to rush to complete as patient is being picked up with [name] to transfer now. Should transport need to be delayed for completion, could then come to bedside to review and complete.  Added hospitalist to conversation at that time. Hospitalist confirmed that patient is full code and patient's daughter has expressed wishes to continue discussions regarding goals of care conversations moving forward so would not make sense medically to complete a MOST form at this time as those aspects of care have not all been determined. Patient and daughter to continue GOC discussions at facility with providers/palliative medicine outpatient follow up. MOST form can be appropriately completed at that time. After discussion, determined patient will still be discharged at this time without MOST form.    Barnett Libel, DO Palliative Care Provider PMT # [phone]  No Charge Note

## 2023-09-05 NOTE — TOC Transition Note (Addendum)
 Transition of Care [facility]) - Discharge Note   Patient Details  Name: Rose Bridges MRN: [id number] Date of Birth: 1960-11-06  Transition of Care [facility] CM/SW Contact:  Ruben Corolla, RN Phone Number: 09/05/2023, 12:51 PM   Clinical Narrative:  d/c back to [name] [address] rep Daril Edge accepted. Going to rm#[id number],report#[phone]. [name] called. No further CM needs.  -3:55p- [name] arrived asking for MOST FORM since patient came from facility with it. [name] rep Starr/Adm Dir Marcille Severance stating its the only one that they sent with the patient. Our attending states patient is a full code, w/ongoing discussions w/otpt palliative care [facility] @ facility.Nsg to manage return to facility.   Final next level of care: Skilled Nursing Facility Barriers to Discharge: No Barriers Identified   Patient Goals and CMS Choice Patient states their goals for this hospitalization and ongoing recovery are:: LTC CMS Medicare.gov Compare Post Acute Care list provided to:: Patient Represenative (must comment) Choice offered to / list presented to : Adult Children [place] ownership interest in [facility].provided to:: Adult Children    Discharge Placement                       Discharge Plan and Services Additional resources added to the After Visit Summary for     Discharge Planning Services: CM Consult Post Acute Care Choice: Skilled Nursing Facility                               Social Drivers of Health (SDOH) Interventions SDOH Screenings   Food Insecurity: No Food Insecurity (09/02/2023)  Housing: Unknown (09/02/2023)  Transportation Needs: No Transportation Needs (09/02/2023)  Utilities: Not At Risk (09/02/2023)  Depression (PHQ2-9): Medium Risk (09/09/2019)  Tobacco Use: Medium Risk (09/03/2023)     Readmission Risk Interventions    04/11/2023   11:14 AM  Readmission Risk Prevention Plan  Transportation Screening Complete  PCP or Specialist Appt within 3-5 Days Complete   HRI or Home Care Consult Complete  Social Work Consult for Recovery Care Planning/Counseling Complete  Palliative Care Screening Complete  Medication Review [employer]) Complete

## 2023-09-07 LAB — CULTURE, BLOOD (ROUTINE X 2): Culture: NO GROWTH

## 2023-09-16 ENCOUNTER — Emergency Department (HOSPITAL_COMMUNITY)

## 2023-09-16 ENCOUNTER — Inpatient Hospital Stay (HOSPITAL_COMMUNITY)
Admission: EM | Admit: 2023-09-16 | Discharge: 2023-09-20 | DRG: 481 | Disposition: A | Discharge status: Skilled Nursing Facility | Source: Skilled Nursing Facility | Attending: Internal Medicine | Admitting: Internal Medicine

## 2023-09-16 ENCOUNTER — Encounter (HOSPITAL_COMMUNITY): Payer: Self-pay | Admitting: Emergency Medicine

## 2023-09-16 DIAGNOSIS — Q780 Osteogenesis imperfecta: Secondary | ICD-10-CM

## 2023-09-16 DIAGNOSIS — I10 Essential (primary) hypertension: Secondary | ICD-10-CM | POA: Diagnosis present

## 2023-09-16 DIAGNOSIS — F609 Personality disorder, unspecified: Secondary | ICD-10-CM | POA: Diagnosis present

## 2023-09-16 DIAGNOSIS — Z90710 Acquired absence of both cervix and uterus: Secondary | ICD-10-CM

## 2023-09-16 DIAGNOSIS — R569 Unspecified convulsions: Secondary | ICD-10-CM

## 2023-09-16 DIAGNOSIS — G40909 Epilepsy, unspecified, not intractable, without status epilepticus: Secondary | ICD-10-CM | POA: Diagnosis not present

## 2023-09-16 DIAGNOSIS — R4189 Other symptoms and signs involving cognitive functions and awareness: Secondary | ICD-10-CM | POA: Diagnosis present

## 2023-09-16 DIAGNOSIS — G894 Chronic pain syndrome: Secondary | ICD-10-CM | POA: Diagnosis present

## 2023-09-16 DIAGNOSIS — Z66 Do not resuscitate: Secondary | ICD-10-CM | POA: Diagnosis present

## 2023-09-16 DIAGNOSIS — S7290XA Unspecified fracture of unspecified femur, initial encounter for closed fracture: Secondary | ICD-10-CM | POA: Diagnosis present

## 2023-09-16 DIAGNOSIS — Z8249 Family history of ischemic heart disease and other diseases of the circulatory system: Secondary | ICD-10-CM

## 2023-09-16 DIAGNOSIS — Z96642 Presence of left artificial hip joint: Secondary | ICD-10-CM | POA: Diagnosis present

## 2023-09-16 DIAGNOSIS — I739 Peripheral vascular disease, unspecified: Secondary | ICD-10-CM | POA: Diagnosis present

## 2023-09-16 DIAGNOSIS — Z8659 Personal history of other mental and behavioral disorders: Secondary | ICD-10-CM | POA: Diagnosis not present

## 2023-09-16 DIAGNOSIS — D62 Acute posthemorrhagic anemia: Secondary | ICD-10-CM | POA: Diagnosis not present

## 2023-09-16 DIAGNOSIS — E785 Hyperlipidemia, unspecified: Secondary | ICD-10-CM | POA: Diagnosis present

## 2023-09-16 DIAGNOSIS — Z8673 Personal history of transient ischemic attack (TIA), and cerebral infarction without residual deficits: Secondary | ICD-10-CM

## 2023-09-16 DIAGNOSIS — M542 Cervicalgia: Secondary | ICD-10-CM | POA: Diagnosis present

## 2023-09-16 DIAGNOSIS — Z79899 Other long term (current) drug therapy: Secondary | ICD-10-CM

## 2023-09-16 DIAGNOSIS — G8929 Other chronic pain: Secondary | ICD-10-CM | POA: Diagnosis present

## 2023-09-16 DIAGNOSIS — F319 Bipolar disorder, unspecified: Secondary | ICD-10-CM | POA: Diagnosis present

## 2023-09-16 DIAGNOSIS — S72452A Displaced supracondylar fracture without intracondylar extension of lower end of left femur, initial encounter for closed fracture: Secondary | ICD-10-CM | POA: Diagnosis not present

## 2023-09-16 DIAGNOSIS — Z6821 Body mass index (BMI) 21.0-21.9, adult: Secondary | ICD-10-CM

## 2023-09-16 DIAGNOSIS — Z885 Allergy status to narcotic agent status: Secondary | ICD-10-CM

## 2023-09-16 DIAGNOSIS — Z87891 Personal history of nicotine dependence: Secondary | ICD-10-CM

## 2023-09-16 DIAGNOSIS — E876 Hypokalemia: Secondary | ICD-10-CM | POA: Diagnosis present

## 2023-09-16 DIAGNOSIS — Z7982 Long term (current) use of aspirin: Secondary | ICD-10-CM

## 2023-09-16 DIAGNOSIS — Z91199 Patient's noncompliance with other medical treatment and regimen due to unspecified reason: Secondary | ICD-10-CM

## 2023-09-16 DIAGNOSIS — W010XXA Fall on same level from slipping, tripping and stumbling without subsequent striking against object, initial encounter: Secondary | ICD-10-CM | POA: Diagnosis present

## 2023-09-16 DIAGNOSIS — R000 Tachycardia, unspecified: Secondary | ICD-10-CM | POA: Diagnosis not present

## 2023-09-16 DIAGNOSIS — E440 Moderate protein-calorie malnutrition: Secondary | ICD-10-CM | POA: Diagnosis present

## 2023-09-16 DIAGNOSIS — D72829 Elevated white blood cell count, unspecified: Secondary | ICD-10-CM | POA: Insufficient documentation

## 2023-09-16 DIAGNOSIS — K219 Gastro-esophageal reflux disease without esophagitis: Secondary | ICD-10-CM | POA: Diagnosis present

## 2023-09-16 DIAGNOSIS — Z823 Family history of stroke: Secondary | ICD-10-CM

## 2023-09-16 DIAGNOSIS — S72402A Unspecified fracture of lower end of left femur, initial encounter for closed fracture: Principal | ICD-10-CM

## 2023-09-16 LAB — CBC WITH DIFFERENTIAL/PLATELET
Abs Immature Granulocytes: 0.07 10*3/uL (ref 0.00–0.07)
Basophils Absolute: 0.1 10*3/uL (ref 0.0–0.1)
Basophils Relative: 0 %
Eosinophils Absolute: 0.1 10*3/uL (ref 0.0–0.5)
Eosinophils Relative: 1 %
HCT: 48.3 % — ABNORMAL HIGH (ref 36.0–46.0)
Hemoglobin: 15.5 g/dL — ABNORMAL HIGH (ref 12.0–15.0)
Immature Granulocytes: 1 %
Lymphocytes Relative: 19 %
Lymphs Abs: 2.7 10*3/uL (ref 0.7–4.0)
MCH: 35.8 pg — ABNORMAL HIGH (ref 26.0–34.0)
MCHC: 32.1 g/dL (ref 30.0–36.0)
MCV: 111.5 fL — ABNORMAL HIGH (ref 80.0–100.0)
Monocytes Absolute: 0.7 10*3/uL (ref 0.1–1.0)
Monocytes Relative: 5 %
Neutro Abs: 10.5 10*3/uL — ABNORMAL HIGH (ref 1.7–7.7)
Neutrophils Relative %: 74 %
Platelets: 239 10*3/uL (ref 150–400)
RBC: 4.33 MIL/uL (ref 3.87–5.11)
RDW: 13 % (ref 11.5–15.5)
Smear Review: NORMAL
WBC: 14.2 10*3/uL — ABNORMAL HIGH (ref 4.0–10.5)
nRBC: 0 % (ref 0.0–0.2)

## 2023-09-16 LAB — COMPREHENSIVE METABOLIC PANEL WITH GFR
ALT: 35 U/L (ref 0–44)
AST: 30 U/L (ref 15–41)
Albumin: 1.8 g/dL — ABNORMAL LOW (ref 3.5–5.0)
Alkaline Phosphatase: 76 U/L (ref 38–126)
Anion gap: 5 (ref 5–15)
BUN: 13 mg/dL (ref 8–23)
CO2: 27 mmol/L (ref 22–32)
Calcium: 7.9 mg/dL — ABNORMAL LOW (ref 8.9–10.3)
Chloride: 109 mmol/L (ref 98–111)
Creatinine, Ser: 0.48 mg/dL (ref 0.44–1.00)
GFR, Estimated: >60 mL/min (ref >60)
Glucose, Bld: 126 mg/dL — ABNORMAL HIGH (ref 70–99)
Potassium: 3.4 mmol/L — ABNORMAL LOW (ref 3.5–5.1)
Sodium: 141 mmol/L (ref 135–145)
Total Bilirubin: 0.3 mg/dL (ref 0.0–1.2)
Total Protein: 4.4 g/dL — ABNORMAL LOW (ref 6.5–8.1)

## 2023-09-16 LAB — URINALYSIS, W/ REFLEX TO CULTURE (INFECTION SUSPECTED)
Bacteria, UA: NONE SEEN
Bilirubin Urine: NEGATIVE
Glucose, UA: NEGATIVE mg/dL
Hgb urine dipstick: NEGATIVE
Ketones, ur: NEGATIVE mg/dL
Leukocytes,Ua: NEGATIVE
Nitrite: NEGATIVE
Protein, ur: NEGATIVE mg/dL
Specific Gravity, Urine: 1.03 (ref 1.005–1.030)
pH: 5 (ref 5.0–8.0)

## 2023-09-16 MED ORDER — FENTANYL CITRATE PF 50 MCG/ML IJ SOSY
50.0000 ug | PREFILLED_SYRINGE | Freq: Once | INTRAMUSCULAR | Status: AC
  Administered 2023-09-16: 50 ug via INTRAVENOUS
  Filled 2023-09-16: qty 1

## 2023-09-16 MED ORDER — HALOPERIDOL LACTATE 5 MG/ML IJ SOLN
2.0000 mg | Freq: Once | INTRAMUSCULAR | Status: AC
  Administered 2023-09-16: 2 mg via INTRAVENOUS
  Filled 2023-09-16: qty 1

## 2023-09-16 MED ORDER — SODIUM CHLORIDE 0.9 % IV BOLUS
1000.0000 mL | Freq: Once | INTRAVENOUS | Status: AC
  Administered 2023-09-16: 1000 mL via INTRAVENOUS

## 2023-09-16 MED ORDER — LORAZEPAM 2 MG/ML IJ SOLN
1.0000 mg | Freq: Once | INTRAMUSCULAR | Status: AC
  Administered 2023-09-16: 1 mg via INTRAVENOUS
  Filled 2023-09-16: qty 1

## 2023-09-16 NOTE — ED Notes (Signed)
 Pt was able to tolerate CT but unable to tolerate xrays. Pt brought back and placed on the monitor.

## 2023-09-16 NOTE — ED Notes (Signed)
 Pt resting on her back. Eyes closed. No distress noted. Airway intact.

## 2023-09-16 NOTE — Progress Notes (Addendum)
 Patient arrived to unit. Soiled a this time, tolerated movement while being cleaned. Unable to complete parts of admission assessment due to drowsiness from prior medication administration before arrival. A/0 to person, place, and situation. Resting comfortably, eyes closed. Placed on tele box per order. MD aware of arrival, en route to bedside for further assessment and orders.

## 2023-09-16 NOTE — H&P (Signed)
 History and Physical    Rose Bridges XLK:[id number] DOB: [birth year] DOA: 09/16/2023  Patient coming from: Skilled nursing facility.  Chief Complaint: Fall.  HPI: Rose Bridges is a 63 y.o. female with history of seizures, depression recently admitted about 2 weeks ago for metabolic encephalopathy attributed to UTI with cognitive impairment was brought to the ER after patient had unwitnessed fall at her living facility.  As per the daughter patient fell off her bed.  Per daughter patient has been nonambulatory for the last 1 year.  ED Course: In the ER x-rays and CT scans were done shows left distal femur fracture.  Orthopedic surgeon on-call was consulted.  Patient was placed on pain medication admitted for further observation.  Labs showed WBC of 14.2 hemoglobin 15.5 potassium 3.4.  EKG shows sinus tachycardia.  Review of Systems: As per HPI, rest all negative.   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[facility])    Metabolic encephalopathy    Osteogenesis imperfecta    Osteogenesis imperfecta    Osteoporosis    Personality disorder (HCC)    PVD (peripheral vascular disease) (HCC)    Stroke [facility])     Past Surgical History:  Procedure Laterality Date   ABDOMINAL HYSTERECTOMY     BIOPSY  07/05/2022   Procedure: BIOPSY;  Surgeon: Normie Becton., MD;  Location: [facility];  Service: Gastroenterology;;   ESOPHAGOGASTRODUODENOSCOPY N/A 07/05/2022   Procedure: ESOPHAGOGASTRODUODENOSCOPY (EGD);  Surgeon: Normie Becton., MD;  Location: [facility];  Service: Gastroenterology;  Laterality: N/A;   EUS N/A  07/05/2022   Procedure: UPPER ENDOSCOPIC ULTRASOUND (EUS) LINEAR;  Surgeon: Normie Becton., MD;  Location: [facility];  Service: Gastroenterology;  Laterality: N/A;   HIP ARTHROPLASTY Left 09/26/2015   Procedure: ARTHROPLASTY BIPOLAR HIP (HEMIARTHROPLASTY);  Surgeon: Saundra Curl, MD;  Location: [facility] OR;  Service: Orthopedics;  Laterality: Left;   ORIF HUMERUS FRACTURE Right 11/06/2016   Procedure: OPEN REDUCTION INTERNAL FIXATION (ORIF) PROXIMAL HUMERUS FRACTURE;  Surgeon: Saundra Curl, MD;  Location: [facility] OR;  Service: Orthopedics;  Laterality: Right;     reports that she quit smoking about 5 years ago. Her smoking use included cigarettes. She has never used smokeless tobacco. She reports that she does not drink alcohol and does not use drugs.  Allergies  Allergen Reactions   Codeine Other (See Comments)    Migraines     Family History  Problem Relation Age of Onset   Stroke Mother    Heart attack Mother    Hypertension Mother    Stroke Sister    Stroke Brother    Lung cancer Father     Prior to Admission medications   Medication Sig Start Date End Date Taking? Authorizing Provider  acetaminophen  (TYLENOL ) 500 MG tablet Take 1,000 mg by mouth 3 (three) times daily.    [provider]  aspirin  81 MG chewable tablet Chew 81 mg by mouth daily.    [provider]  BIOFREEZE COOL THE PAIN 4 % GEL Apply 1 application  topically See admin instructions. Apply to both hands and knees 2 times  a day    [provider]  bisacodyl  (DULCOLAX) 10 MG suppository Place 10 mg rectally daily as needed (for constipation).    [provider]  calcitonin, salmon, (MIACALCIN /FORTICAL) 200 UNIT/ACT nasal spray Place 1 spray into alternate nostrils daily. Patient taking differently: 1 spray See admin instructions. Instill 1 spray into the right nostril once a day- per [facility] 04/08/22   Quillen, Michael, MD  calcium  carbonate (TUMS - DOSED IN MG ELEMENTAL  CALCIUM ) 500 MG chewable tablet Chew 1 tablet by mouth 2 (two) times daily.    [provider]  celecoxib  (CELEBREX ) 50 MG capsule Take 50 mg by mouth daily. 05/06/23   [provider]  DEPAKOTE ER 250 MG 24 hr tablet Take 250 mg by mouth at bedtime.    [provider]  diclofenac Sodium (VOLTAREN) 1 % GEL Apply 2 g topically See admin instructions. Apply 2 grams to the right knee 2 times a day    [provider]  DULoxetine  (CYMBALTA ) 30 MG capsule Take 1 capsule (30 mg total) by mouth daily. Patient taking differently: Take 30 mg by mouth 2 (two) times daily. 04/12/23   Audria Leather, MD  FIBER PO Take 0.4 g by mouth 3 (three) times daily between meals.    [provider]  gabapentin  (NEURONTIN ) 300 MG capsule Take 1 capsule (300 mg total) by mouth 3 (three) times daily. 04/11/23   Audria Leather, MD  levETIRAcetam  (KEPPRA ) 500 MG tablet Take 1 tablet (500 mg total) by mouth 2 (two) times daily. 05/18/22   Gherghe, Costin M, MD  LORazepam  (ATIVAN ) 0.5 MG tablet Take 0.5 mg by mouth every 6 (six) hours as needed for anxiety. 05/20/23   [provider]  magnesium  oxide (MAG-OX) 400 (240 Mg) MG tablet Take 400 mg by mouth daily.    [provider]  metoprolol  tartrate (LOPRESSOR ) 25 MG tablet Take 1 tablet (25 mg total) by mouth every hour as needed (HR >120; hold for SBP <100). 09/04/23 09/03/24  Lorita Rosa, MD  oxyCODONE  (OXY IR/ROXICODONE ) 5 MG immediate release tablet Take 5 mg by mouth every 8 (eight) hours as needed (for pain). 05/14/23   [provider]  pantoprazole  (PROTONIX ) 40 MG tablet Take 40 mg by mouth daily before breakfast.    [provider]  polyethylene glycol powder (GLYCOLAX /MIRALAX ) 17 GM/SCOOP powder Take 17 g by mouth daily as needed (to aid the bowels- mix into 4 to 6 ounces of fluid).    [provider]  QUEtiapine  (SEROQUEL ) 25 MG tablet Take 0.5 tablets (12.5 mg total) by mouth 2 (two)  times daily. 09/04/23   Lorita Rosa, MD  senna (SENOKOT) 8.6 MG TABS tablet Take 1 tablet by mouth daily as needed for mild constipation.    [provider]  Vitamin D , Ergocalciferol , (DRISDOL ) 1.25 MG (50000 UNIT) CAPS capsule Take 1 capsule (50,000 Units total) by mouth every 7 (seven) days. Patient taking differently: Take 50,000 Units by mouth every Thursday. 04/12/22   Ivin Marrow, MD    Physical Exam: Constitutional: Moderately built and nourished. Vitals:   09/16/23 1828 09/16/23 1937 09/16/23 2100 09/16/23 2257  BP:   110/87   Pulse: (!) 120 (!) 113 (!) 115 (!) 134  Resp: 16  11 16   Temp:   98.4 F (36.9 C) 98.5 F (36.9 C)  TempSrc:      SpO2:  100% 99% 100%   Eyes: Anicteric no pallor. ENMT: No discharge from the ears/nose or mouth.  Neck: No mass felt.  No neck rigidity. Respiratory: No rhonchi or crepitations. Cardiovascular: S1-S2 heard. Abdomen: Soft nontender bowel sound present. Musculoskeletal: No edema. Skin: No rash. Neurologic: Patient is not very communicative. Psychiatric: Not very communicative.   Labs on Admission: I have personally reviewed following labs and imaging studies  CBC: Recent Labs  Lab 09/16/23 1815  WBC 14.2*  NEUTROABS 10.5*  HGB 15.5*  HCT 48.3*  MCV 111.5*  PLT 239   Basic Metabolic Panel: Recent Labs  Lab 09/16/23 2052  NA 141  K 3.4*  CL 109  CO2 27  GLUCOSE 126*  BUN 13  CREATININE 0.48  CALCIUM  7.9*   GFR: Estimated Creatinine Clearance: 61.3 mL/min (by C-G formula based on SCr of 0.48 mg/dL). Liver Function Tests: Recent Labs  Lab 09/16/23 2052  AST 30  ALT 35  ALKPHOS 76  BILITOT 0.3  PROT 4.4*  ALBUMIN 1.8*   No results for input(s): "LIPASE", "AMYLASE" in the last 168 hours. No results for input(s): "AMMONIA" in the last 168 hours. Coagulation Profile: No results for input(s): "INR", "PROTIME" in the last 168 hours. Cardiac Enzymes: No results for input(s): "CKTOTAL", "CKMB",  "CKMBINDEX", "TROPONINI" in the last 168 hours. BNP (last 3 results) No results for input(s): "PROBNP" in the last 8760 hours. HbA1C: No results for input(s): "HGBA1C" in the last 72 hours. CBG: No results for input(s): "GLUCAP" in the last 168 hours. Lipid Profile: No results for input(s): "CHOL", "HDL", "LDLCALC", "TRIG", "CHOLHDL", "LDLDIRECT" in the last 72 hours. Thyroid  Function Tests: No results for input(s): "TSH", "T4TOTAL", "FREET4", "T3FREE", "THYROIDAB" in the last 72 hours. Anemia Panel: No results for input(s): "VITAMINB12", "FOLATE", "FERRITIN", "TIBC", "IRON", "RETICCTPCT" in the last 72 hours. Urine analysis:    Component Value Date/Time   COLORURINE YELLOW 09/16/2023 1803   APPEARANCEUR CLEAR 09/16/2023 1803   LABSPEC 1.030 09/16/2023 1803   PHURINE 5.0 09/16/2023 1803   GLUCOSEU NEGATIVE 09/16/2023 1803   HGBUR NEGATIVE 09/16/2023 1803   BILIRUBINUR NEGATIVE 09/16/2023 1803   KETONESUR NEGATIVE 09/16/2023 1803   PROTEINUR NEGATIVE 09/16/2023 1803   NITRITE NEGATIVE 09/16/2023 1803   LEUKOCYTESUR NEGATIVE 09/16/2023 1803   Sepsis Labs: @LABRCNTIP (procalcitonin:4,lacticidven:4) )No results found for this or any previous visit (from the past 240 hours).   Radiological Exams on Admission: DG HIP UNILAT WITH PELVIS 2-3 VIEWS LEFT Result Date: 09/16/2023 CLINICAL DATA:  Fall, pain EXAM: DG HIP (WITH OR WITHOUT PELVIS) 2-3V LEFT COMPARISON:  None Available. FINDINGS: Diffuse osteopenia. Prior left hip replacement. No hardware complicating feature. No acute bony abnormality. Specifically, no fracture, subluxation, or dislocation. IMPRESSION: Osteopenia.  No acute bony abnormality. Electronically Signed   By: Janeece Mechanic M.D.   On: 09/16/2023 21:37   DG Chest 1 View Result Date: 09/16/2023 CLINICAL DATA:  Fall, pain EXAM: CHEST  1 VIEW COMPARISON:  05/17/2023 FINDINGS: Heart and mediastinal contours are within normal limits. No focal opacities or effusions. No acute  bony abnormality. Calcifications noted in the breasts bilaterally. IMPRESSION: No active disease. Electronically Signed   By: Janeece Mechanic M.D.   On: 09/16/2023 21:37   DG Knee 1-2 Views Left Result Date: 09/16/2023 CLINICAL DATA:  Pain EXAM: LEFT KNEE - 1-2 VIEW COMPARISON:  None Available. FINDINGS: The bones are diffusely osteopenic. There is an acute oblique fracture through the distal femoral diaphysis without significant displacement or angulation. Joint spaces are maintained and alignment is anatomic. Soft tissues are within normal limits. IMPRESSION: Acute oblique fracture through the distal femoral diaphysis.  Electronically Signed   By: Tyron Gallon M.D.   On: 09/16/2023 21:36   DG HIP UNILAT WITH PELVIS 2-3 VIEWS RIGHT Result Date: 09/16/2023 CLINICAL DATA:  Fall, pain EXAM: DG HIP (WITH OR WITHOUT PELVIS) 2-3V RIGHT COMPARISON:  None Available. FINDINGS: There is no evidence of hip fracture or dislocation. There is no evidence of arthropathy or other focal bone abnormality. IMPRESSION: Negative. Electronically Signed   By: Janeece Mechanic M.D.   On: 09/16/2023 21:35   DG Foot 2 Views Left Result Date: 09/16/2023 CLINICAL DATA:  Fall, pain EXAM: LEFT FOOT - 2 VIEW COMPARISON:  None Available. FINDINGS: Diffuse osteopenia. No acute bony abnormality. Specifically, no fracture, subluxation, or dislocation. IMPRESSION: Osteopenia.  No acute bony abnormality. Electronically Signed   By: Janeece Mechanic M.D.   On: 09/16/2023 21:35   DG Foot 2 Views Right Result Date: 09/16/2023 CLINICAL DATA:  Fall, pain EXAM: RIGHT FOOT - 2 VIEW COMPARISON:  None Available. FINDINGS: Osteopenia. No acute bony abnormality. Specifically, no fracture, subluxation, or dislocation. IMPRESSION: Osteopenia.  No acute bony abnormality. Electronically Signed   By: Janeece Mechanic M.D.   On: 09/16/2023 21:34   DG Ankle 2 Views Right Result Date: 09/16/2023 CLINICAL DATA:  Fall, pain EXAM: RIGHT ANKLE - 2 VIEW COMPARISON:  None  Available. FINDINGS: Diffuse osteopenia. No acute bony abnormality. Specifically, no fracture, subluxation, or dislocation. IMPRESSION: Osteopenia.  No acute bony abnormality. Electronically Signed   By: Janeece Mechanic M.D.   On: 09/16/2023 21:34   DG Ankle 2 Views Left Result Date: 09/16/2023 CLINICAL DATA:  Fall, pain EXAM: LEFT ANKLE - 2 VIEW COMPARISON:  None Available. FINDINGS: Diffuse osteopenia. No acute bony abnormality. Specifically, no fracture, subluxation, or dislocation. IMPRESSION: No acute bony abnormality.  Osteopenia Electronically Signed   By: Janeece Mechanic M.D.   On: 09/16/2023 21:33   DG Knee 1-2 Views Right Result Date: 09/16/2023 CLINICAL DATA:  Fall, pain EXAM: RIGHT KNEE - 1-2 VIEW COMPARISON:  None Available. FINDINGS: Diffuse osteopenia. No acute bony abnormality. Specifically, no fracture, subluxation, or dislocation. No joint effusion. IMPRESSION: Osteopenia.  No acute bony abnormality. Electronically Signed   By: Janeece Mechanic M.D.   On: 09/16/2023 21:33   CT Head Wo Contrast Result Date: 09/16/2023 CLINICAL DATA:  Head and neck trauma EXAM: CT HEAD WITHOUT CONTRAST CT CERVICAL SPINE WITHOUT CONTRAST TECHNIQUE: Multidetector CT imaging of the head and cervical spine was performed following the standard protocol without intravenous contrast. Multiplanar CT image reconstructions of the cervical spine were also generated. RADIATION DOSE REDUCTION: This exam was performed according to the departmental dose-optimization program which includes automated exposure control, adjustment of the mA and/or kV according to patient size and/or use of iterative reconstruction technique. COMPARISON:  CT brain 09/01/2023, 05/28/2023, CT cervical spine 04/19/2018 FINDINGS: CT HEAD FINDINGS Brain: No acute territorial infarction, hemorrhage or intracranial mass. Moderate atrophy.moderate chronic small vessel ischemic changes of the white matter. Multiple chronic appearing lacunar infarcts within the  bilateral basal ganglia and thalami. Stable ventricle size. Vascular: No hyperdense vessels.  Carotid vascular calcification Skull: No fracture Sinuses/Orbits: Mucosal thickening in the sinuses Other: None CT CERVICAL SPINE FINDINGS Alignment: No subluxation.  Facet alignment is within normal limits. Skull base and vertebrae: No acute fracture. No primary bone lesion or focal pathologic process. Soft tissues and spinal canal: No prevertebral fluid or swelling. No visible canal hematoma. Disc levels:  Mild degenerative changes C4-C5, C5-C6 and C6-C7 Upper chest: Negative. Other: None IMPRESSION: 1. Limited  by habitus and positioning. No definite CT evidence for acute intracranial abnormality. Atrophy and chronic small vessel ischemic changes of the white matter. Multiple chronic appearing lacunar infarcts within the bilateral basal ganglia and thalami. 2. Degenerative changes of the cervical spine. No acute osseous abnormality. Electronically Signed   By: Esmeralda Hedge M.D.   On: 09/16/2023 19:42   CT Cervical Spine Wo Contrast Result Date: 09/16/2023 CLINICAL DATA:  Head and neck trauma EXAM: CT HEAD WITHOUT CONTRAST CT CERVICAL SPINE WITHOUT CONTRAST TECHNIQUE: Multidetector CT imaging of the head and cervical spine was performed following the standard protocol without intravenous contrast. Multiplanar CT image reconstructions of the cervical spine were also generated. RADIATION DOSE REDUCTION: This exam was performed according to the departmental dose-optimization program which includes automated exposure control, adjustment of the mA and/or kV according to patient size and/or use of iterative reconstruction technique. COMPARISON:  CT brain 09/01/2023, 05/28/2023, CT cervical spine 04/19/2018 FINDINGS: CT HEAD FINDINGS Brain: No acute territorial infarction, hemorrhage or intracranial mass. Moderate atrophy.moderate chronic small vessel ischemic changes of the white matter. Multiple chronic appearing lacunar  infarcts within the bilateral basal ganglia and thalami. Stable ventricle size. Vascular: No hyperdense vessels.  Carotid vascular calcification Skull: No fracture Sinuses/Orbits: Mucosal thickening in the sinuses Other: None CT CERVICAL SPINE FINDINGS Alignment: No subluxation.  Facet alignment is within normal limits. Skull base and vertebrae: No acute fracture. No primary bone lesion or focal pathologic process. Soft tissues and spinal canal: No prevertebral fluid or swelling. No visible canal hematoma. Disc levels:  Mild degenerative changes C4-C5, C5-C6 and C6-C7 Upper chest: Negative. Other: None IMPRESSION: 1. Limited by habitus and positioning. No definite CT evidence for acute intracranial abnormality. Atrophy and chronic small vessel ischemic changes of the white matter. Multiple chronic appearing lacunar infarcts within the bilateral basal ganglia and thalami. 2. Degenerative changes of the cervical spine. No acute osseous abnormality. Electronically Signed   By: Esmeralda Hedge M.D.   On: 09/16/2023 19:42    EKG: Independently reviewed.  Sinus tachycardia.  Assessment/Plan Principal Problem:   Femur fracture (HCC) Active Problems:   Osteogenesis imperfecta   Epilepsy (HCC)   Chronic pain   History of bipolar disorder   GERD (gastroesophageal reflux disease)    Left distal femur fracture after mechanical fall.  Orthopedic surgery has been consulted.  Per patient's patient is at baseline nonambulatory.  Will await further recommendations per orthopedics.  Pain relief medications. Bipolar disorder on Cymbalta  Depakote Seroquel .  On as needed Ativan  for anxiety. History of seizures on Keppra .  Patient is on gabapentin  could be for seizures or neuropathy. Mild hypokalemia replace recheck. Leukocytosis likely reactionary. Cognitive impairment per patient's daughter patient is scheduled to follow-up with neurologist in few months. Sinus tachycardia was noticed during recent admission also.   Check TSH.  Since patient has distal femur fracture will need close monitoring and further workup and more than 2 midnight stay.   DVT prophylaxis: SCDs. Code Status: Full code as confirmed with patient's daughter. Family Communication: Daughter Ms. Harb. Disposition Plan: Medical floor. Consults called: Orthopedics. Admission status: Inpatient.

## 2023-09-16 NOTE — ED Triage Notes (Signed)
 BIB EMS from [facility] s/p fall. Denies head injury or LOC. Left foot swelling noted en route. No blood thinners. Confusion at baseline per facility.

## 2023-09-16 NOTE — ED Provider Notes (Signed)
 [facility] Provider Note  CSN: [id number] Arrival date & time: 09/16/23 1709  Chief Complaint(s) Fall  HPI Rose Bridges is a 63 y.o. female history of bipolar disorder, personality disorder, osteogenesis imperfecta presenting into the emergency department with reported fall.  Patient reports that she walked and tripped.  Unclear if she was actually walking or not.  She reports pain to the left lower extremity.  She is not able to tell me exactly where it hurts and reports the whole thing hurts.  She also reports right lower extremity pain although less severe.  Patient denies hitting her head.  Patient denies any chest pain, abdominal pain, fevers or chills, loss of consciousness.  History is slightly limited by confusion, unclear if patient is reliable historian   Past Medical History 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(HCC)    Metabolic encephalopathy    Osteogenesis imperfecta    Osteogenesis imperfecta    Osteoporosis    Personality disorder (HCC)    PVD (peripheral vascular disease) (HCC)    Stroke [facility])    Patient Active Problem List   Diagnosis Date Noted   Femur fracture (HCC) 09/16/2023   Palliative care encounter 09/05/2023   Acute metabolic encephalopathy 09/02/2023   Dehydration 09/02/2023   Acute prerenal azotemia 09/02/2023   Hypomagnesemia 09/02/2023   Prolonged QT interval 09/02/2023   History of bipolar disorder 09/02/2023   History of seizures 09/02/2023   GERD (gastroesophageal reflux disease) 09/02/2023   Agitation 09/01/2023   Major depressive disorder  05/30/2023   Medically noncompliant 05/29/2023   Acute encephalopathy 04/07/2023   AKI (acute kidney injury) (HCC) 04/07/2023   Acute cystitis 04/07/2023   Depression with anxiety 04/07/2023   Chronic pain 04/07/2023   Epilepsy (HCC)    Alcohol dependence (HCC) 05/22/2022   Delirium due to another medical condition, acute, hyperactive 05/18/2022   Acute pancreatitis without infection or necrosis 05/14/2022   Abnormal findings on imaging of biliary tract 05/14/2022   AMS (altered mental status) 05/10/2022   Pelvic fracture (HCC) 04/03/2022   Tobacco use 04/03/2022   Osteogenesis imperfecta 04/28/2018   Seizure (HCC) 03/26/2017   Home Medication(s) Prior to Admission medications   Medication Sig Start Date End Date Taking? Authorizing Provider  acetaminophen  (TYLENOL ) 500 MG tablet Take 1,000 mg by mouth 3 (three) times daily.    [provider]  aspirin  81 MG chewable tablet Chew 81 mg by mouth daily.    [provider]  BIOFREEZE COOL THE PAIN 4 % GEL Apply 1 application  topically See admin instructions. Apply to both hands and knees 2 times a day    [provider]  bisacodyl  (DULCOLAX) 10 MG suppository Place 10 mg rectally daily as needed (for constipation).    [provider]  calcitonin, salmon, (MIACALCIN /FORTICAL) 200 UNIT/ACT nasal spray Place 1 spray into alternate nostrils daily. Patient taking differently: 1 spray See admin instructions. Instill 1 spray into the right nostril once a day- per [facility] 04/08/22   Ivin Marrow, MD  calcium  carbonate (TUMS - DOSED IN MG ELEMENTAL  CALCIUM ) 500 MG chewable tablet Chew 1 tablet by mouth 2 (two) times daily.    [provider]  celecoxib  (CELEBREX ) 50 MG capsule Take 50 mg by mouth daily. 05/06/23   [provider]  DEPAKOTE ER 250 MG 24 hr tablet Take 250 mg by mouth at bedtime.    [provider]  diclofenac Sodium (VOLTAREN) 1 % GEL Apply 2 g topically See admin  instructions. Apply 2 grams to the right knee 2 times a day    [provider]  DULoxetine  (CYMBALTA ) 30 MG capsule Take 1 capsule (30 mg total) by mouth daily. Patient taking differently: Take 30 mg by mouth 2 (two) times daily. 04/12/23   Audria Leather, MD  FIBER PO Take 0.4 g by mouth 3 (three) times daily between meals.    [provider]  gabapentin  (NEURONTIN ) 300 MG capsule Take 1 capsule (300 mg total) by mouth 3 (three) times daily. 04/11/23   Audria Leather, MD  levETIRAcetam  (KEPPRA ) 500 MG tablet Take 1 tablet (500 mg total) by mouth 2 (two) times daily. 05/18/22   Gherghe, Costin M, MD  LORazepam  (ATIVAN ) 0.5 MG tablet Take 0.5 mg by mouth every 6 (six) hours as needed for anxiety. 05/20/23   [provider]  magnesium  oxide (MAG-OX) 400 (240 Mg) MG tablet Take 400 mg by mouth daily.    [provider]  metoprolol  tartrate (LOPRESSOR ) 25 MG tablet Take 1 tablet (25 mg total) by mouth every hour as needed (HR >120; hold for SBP <100). 09/04/23 09/03/24  Lorita Rosa, MD  oxyCODONE  (OXY IR/ROXICODONE ) 5 MG immediate release tablet Take 5 mg by mouth every 8 (eight) hours as needed (for pain). 05/14/23   [provider]  pantoprazole  (PROTONIX ) 40 MG tablet Take 40 mg by mouth daily before breakfast.    [provider]  polyethylene glycol powder (GLYCOLAX /MIRALAX ) 17 GM/SCOOP powder Take 17 g by mouth daily as needed (to aid the bowels- mix into 4 to 6 ounces of fluid).    [provider]  QUEtiapine  (SEROQUEL ) 25 MG tablet Take 0.5 tablets (12.5 mg total) by mouth 2 (two) times daily. 09/04/23   Lorita Rosa, MD  senna (SENOKOT) 8.6 MG TABS tablet Take 1 tablet by mouth daily as needed for mild constipation.    [provider]  Vitamin D , Ergocalciferol , (DRISDOL ) 1.25 MG (50000 UNIT) CAPS capsule Take 1 capsule (50,000 Units total) by mouth every 7 (seven) days. Patient taking differently: Take 50,000 Units by mouth  every Thursday. 04/12/22   Ivin Marrow, MD                                                                                                                                    Past Surgical History Past Surgical History:  Procedure Laterality Date   ABDOMINAL HYSTERECTOMY     BIOPSY  07/05/2022   Procedure: BIOPSY;  Surgeon: Yong Henle  Marieta Shorten., MD;  Location: [facility];  Service: Gastroenterology;;   ESOPHAGOGASTRODUODENOSCOPY N/A 07/05/2022   Procedure: ESOPHAGOGASTRODUODENOSCOPY (EGD);  Surgeon: Normie Becton., MD;  Location: [facility];  Service: Gastroenterology;  Laterality: N/A;   EUS N/A 07/05/2022   Procedure: UPPER ENDOSCOPIC ULTRASOUND (EUS) LINEAR;  Surgeon: Normie Becton., MD;  Location: [facility];  Service: Gastroenterology;  Laterality: N/A;   HIP ARTHROPLASTY Left 09/26/2015   Procedure: ARTHROPLASTY BIPOLAR HIP (HEMIARTHROPLASTY);  Surgeon: Saundra Curl, MD;  Location: [facility] OR;  Service: Orthopedics;  Laterality: Left;   ORIF HUMERUS FRACTURE Right 11/06/2016   Procedure: OPEN REDUCTION INTERNAL FIXATION (ORIF) PROXIMAL HUMERUS FRACTURE;  Surgeon: Saundra Curl, MD;  Location: [facility] OR;  Service: Orthopedics;  Laterality: Right;   Family History Family History  Problem Relation Age of Onset   Stroke Mother    Heart attack Mother    Hypertension Mother    Stroke Sister    Stroke Brother    Lung cancer Father     Social History Social History   Tobacco Use   Smoking status: Former    Current packs/day: 0.00    Types: Cigarettes    Quit date: 2020    Years since quitting: 5.3   Smokeless tobacco: Never  Vaping Use   Vaping status: Never Used  Substance Use Topics   Alcohol use: No    Alcohol/week: 85.0 standard drinks of alcohol    Types: 85 Shots of liquor per week    Comment: No alcohol use since 11/04/16.   Drug use: No   Allergies Codeine  Review of Systems Review of Systems  All other systems reviewed and are  negative.   Physical Exam Vital Signs  I have reviewed the triage vital signs BP 110/87   Pulse (!) 115   Temp 98.4 F (36.9 C)   Resp 11   SpO2 99%  Physical Exam Vitals and nursing note reviewed.  Constitutional:      General: She is not in acute distress.    Appearance: She is well-developed.  HENT:     Head: Normocephalic and atraumatic.     Mouth/Throat:     Mouth: Mucous membranes are moist.  Eyes:     Pupils: Pupils are equal, round, and reactive to light.  Cardiovascular:     Rate and Rhythm: Normal rate and regular rhythm.     Heart sounds: No murmur heard. Pulmonary:     Effort: Pulmonary effort is normal. No respiratory distress.     Breath sounds: Normal breath sounds.  Abdominal:     General: Abdomen is flat.     Palpations: Abdomen is soft.     Tenderness: There is no abdominal tenderness.  Musculoskeletal:     Comments: Patient reports tenderness throughout her entire left lower extremity and refuses to allow me to try to range her extremity in any joint.  Similarly, patient also non participatory in examination of the right lower extremity. No midline C/T/L spine tenderness.   Skin:    General: Skin is warm and dry.  Neurological:     General: No focal deficit present.     Mental Status: She is alert. Mental status is at baseline.  Psychiatric:        Mood and Affect: Mood normal.        Behavior: Behavior normal.     ED Results and Treatments Labs (all labs ordered are listed, but only abnormal results are displayed) Labs Reviewed  CBC WITH DIFFERENTIAL/PLATELET -  Abnormal; Notable for the following components:      Result Value   WBC 14.2 (*)    Hemoglobin 15.5 (*)    HCT 48.3 (*)    MCV 111.5 (*)    MCH 35.8 (*)    Neutro Abs 10.5 (*)    All other components within normal limits  COMPREHENSIVE METABOLIC PANEL WITH GFR - Abnormal; Notable for the following components:   Potassium 3.4 (*)    Glucose, Bld 126 (*)    Calcium  7.9 (*)     Total Protein 4.4 (*)    Albumin 1.8 (*)    All other components within normal limits  URINALYSIS, W/ REFLEX TO CULTURE (INFECTION SUSPECTED)                                                                                                                          Radiology DG HIP UNILAT WITH PELVIS 2-3 VIEWS LEFT Result Date: 09/16/2023 CLINICAL DATA:  Fall, pain EXAM: DG HIP (WITH OR WITHOUT PELVIS) 2-3V LEFT COMPARISON:  None Available. FINDINGS: Diffuse osteopenia. Prior left hip replacement. No hardware complicating feature. No acute bony abnormality. Specifically, no fracture, subluxation, or dislocation. IMPRESSION: Osteopenia.  No acute bony abnormality. Electronically Signed   By: Janeece Mechanic M.D.   On: 09/16/2023 21:37   DG Chest 1 View Result Date: 09/16/2023 CLINICAL DATA:  Fall, pain EXAM: CHEST  1 VIEW COMPARISON:  05/17/2023 FINDINGS: Heart and mediastinal contours are within normal limits. No focal opacities or effusions. No acute bony abnormality. Calcifications noted in the breasts bilaterally. IMPRESSION: No active disease. Electronically Signed   By: Janeece Mechanic M.D.   On: 09/16/2023 21:37   DG Knee 1-2 Views Left Result Date: 09/16/2023 CLINICAL DATA:  Pain EXAM: LEFT KNEE - 1-2 VIEW COMPARISON:  None Available. FINDINGS: The bones are diffusely osteopenic. There is an acute oblique fracture through the distal femoral diaphysis without significant displacement or angulation. Joint spaces are maintained and alignment is anatomic. Soft tissues are within normal limits. IMPRESSION: Acute oblique fracture through the distal femoral diaphysis. Electronically Signed   By: Tyron Gallon M.D.   On: 09/16/2023 21:36   DG HIP UNILAT WITH PELVIS 2-3 VIEWS RIGHT Result Date: 09/16/2023 CLINICAL DATA:  Fall, pain EXAM: DG HIP (WITH OR WITHOUT PELVIS) 2-3V RIGHT COMPARISON:  None Available. FINDINGS: There is no evidence of hip fracture or dislocation. There is no evidence of arthropathy or  other focal bone abnormality. IMPRESSION: Negative. Electronically Signed   By: Janeece Mechanic M.D.   On: 09/16/2023 21:35   DG Foot 2 Views Left Result Date: 09/16/2023 CLINICAL DATA:  Fall, pain EXAM: LEFT FOOT - 2 VIEW COMPARISON:  None Available. FINDINGS: Diffuse osteopenia. No acute bony abnormality. Specifically, no fracture, subluxation, or dislocation. IMPRESSION: Osteopenia.  No acute bony abnormality. Electronically Signed   By: Janeece Mechanic M.D.   On: 09/16/2023 21:35   DG Foot 2 Views Right Result Date: 09/16/2023 CLINICAL DATA:  Fall, pain EXAM: RIGHT FOOT - 2 VIEW COMPARISON:  None Available. FINDINGS: Osteopenia. No acute bony abnormality. Specifically, no fracture, subluxation, or dislocation. IMPRESSION: Osteopenia.  No acute bony abnormality. Electronically Signed   By: Janeece Mechanic M.D.   On: 09/16/2023 21:34   DG Ankle 2 Views Right Result Date: 09/16/2023 CLINICAL DATA:  Fall, pain EXAM: RIGHT ANKLE - 2 VIEW COMPARISON:  None Available. FINDINGS: Diffuse osteopenia. No acute bony abnormality. Specifically, no fracture, subluxation, or dislocation. IMPRESSION: Osteopenia.  No acute bony abnormality. Electronically Signed   By: Janeece Mechanic M.D.   On: 09/16/2023 21:34   DG Ankle 2 Views Left Result Date: 09/16/2023 CLINICAL DATA:  Fall, pain EXAM: LEFT ANKLE - 2 VIEW COMPARISON:  None Available. FINDINGS: Diffuse osteopenia. No acute bony abnormality. Specifically, no fracture, subluxation, or dislocation. IMPRESSION: No acute bony abnormality.  Osteopenia Electronically Signed   By: Janeece Mechanic M.D.   On: 09/16/2023 21:33   DG Knee 1-2 Views Right Result Date: 09/16/2023 CLINICAL DATA:  Fall, pain EXAM: RIGHT KNEE - 1-2 VIEW COMPARISON:  None Available. FINDINGS: Diffuse osteopenia. No acute bony abnormality. Specifically, no fracture, subluxation, or dislocation. No joint effusion. IMPRESSION: Osteopenia.  No acute bony abnormality. Electronically Signed   By: Janeece Mechanic M.D.    On: 09/16/2023 21:33   CT Head Wo Contrast Result Date: 09/16/2023 CLINICAL DATA:  Head and neck trauma EXAM: CT HEAD WITHOUT CONTRAST CT CERVICAL SPINE WITHOUT CONTRAST TECHNIQUE: Multidetector CT imaging of the head and cervical spine was performed following the standard protocol without intravenous contrast. Multiplanar CT image reconstructions of the cervical spine were also generated. RADIATION DOSE REDUCTION: This exam was performed according to the departmental dose-optimization program which includes automated exposure control, adjustment of the mA and/or kV according to patient size and/or use of iterative reconstruction technique. COMPARISON:  CT brain 09/01/2023, 05/28/2023, CT cervical spine 04/19/2018 FINDINGS: CT HEAD FINDINGS Brain: No acute territorial infarction, hemorrhage or intracranial mass. Moderate atrophy.moderate chronic small vessel ischemic changes of the white matter. Multiple chronic appearing lacunar infarcts within the bilateral basal ganglia and thalami. Stable ventricle size. Vascular: No hyperdense vessels.  Carotid vascular calcification Skull: No fracture Sinuses/Orbits: Mucosal thickening in the sinuses Other: None CT CERVICAL SPINE FINDINGS Alignment: No subluxation.  Facet alignment is within normal limits. Skull base and vertebrae: No acute fracture. No primary bone lesion or focal pathologic process. Soft tissues and spinal canal: No prevertebral fluid or swelling. No visible canal hematoma. Disc levels:  Mild degenerative changes C4-C5, C5-C6 and C6-C7 Upper chest: Negative. Other: None IMPRESSION: 1. Limited by habitus and positioning. No definite CT evidence for acute intracranial abnormality. Atrophy and chronic small vessel ischemic changes of the white matter. Multiple chronic appearing lacunar infarcts within the bilateral basal ganglia and thalami. 2. Degenerative changes of the cervical spine. No acute osseous abnormality. Electronically Signed   By: Esmeralda Hedge  M.D.   On: 09/16/2023 19:42   CT Cervical Spine Wo Contrast Result Date: 09/16/2023 CLINICAL DATA:  Head and neck trauma EXAM: CT HEAD WITHOUT CONTRAST CT CERVICAL SPINE WITHOUT CONTRAST TECHNIQUE: Multidetector CT imaging of the head and cervical spine was performed following the standard protocol without intravenous contrast. Multiplanar CT image reconstructions of the cervical spine were also generated. RADIATION DOSE REDUCTION: This exam was performed according to the departmental dose-optimization program which includes automated exposure control, adjustment of the mA and/or kV according to patient size and/or use of iterative reconstruction technique. COMPARISON:  CT  brain 09/01/2023, 05/28/2023, CT cervical spine 04/19/2018 FINDINGS: CT HEAD FINDINGS Brain: No acute territorial infarction, hemorrhage or intracranial mass. Moderate atrophy.moderate chronic small vessel ischemic changes of the white matter. Multiple chronic appearing lacunar infarcts within the bilateral basal ganglia and thalami. Stable ventricle size. Vascular: No hyperdense vessels.  Carotid vascular calcification Skull: No fracture Sinuses/Orbits: Mucosal thickening in the sinuses Other: None CT CERVICAL SPINE FINDINGS Alignment: No subluxation.  Facet alignment is within normal limits. Skull base and vertebrae: No acute fracture. No primary bone lesion or focal pathologic process. Soft tissues and spinal canal: No prevertebral fluid or swelling. No visible canal hematoma. Disc levels:  Mild degenerative changes C4-C5, C5-C6 and C6-C7 Upper chest: Negative. Other: None IMPRESSION: 1. Limited by habitus and positioning. No definite CT evidence for acute intracranial abnormality. Atrophy and chronic small vessel ischemic changes of the white matter. Multiple chronic appearing lacunar infarcts within the bilateral basal ganglia and thalami. 2. Degenerative changes of the cervical spine. No acute osseous abnormality. Electronically Signed    By: Esmeralda Hedge M.D.   On: 09/16/2023 19:42    Pertinent labs & imaging results that were available during my care of the patient were reviewed by me and considered in my medical decision making (see MDM for details).  Medications Ordered in ED Medications  fentaNYL  (SUBLIMAZE ) injection 50 mcg (50 mcg Intravenous Given 09/16/23 1827)  sodium chloride  0.9 % bolus 1,000 mL (1,000 mLs Intravenous New Bag/Given 09/16/23 1813)  haloperidol  lactate (HALDOL ) injection 2 mg (2 mg Intravenous Given 09/16/23 1938)  LORazepam  (ATIVAN ) injection 1 mg (1 mg Intravenous Given 09/16/23 1938)                                                                                                                                     Procedures Procedures  (including critical care time)  Medical Decision Making / ED Course   MDM:  63 year old presenting to the emergency department after reported fall.  History seems limited due to confusion.  The patient is reporting that she is able to walk and fell.  She is very noncompliant with examination so we will need to obtain additional x-rays.  Also obtain CT head and neck, patient reports she did not hit her head but I am not sure if I believe her.  Per EMS patient is at her baseline.  She was recently met in the hospital for altered mental status and seems consistent with that.  Vitals are notable for mild tachycardia, labs with mild leukocytosis.  No other focal findings, did have urine culture growing Pseudomonas while inpatient, will obtain repeat urinalysis.  If any injuries are present will consult orthopedics if needed  Clinical Course as of 09/16/23 2235  Mon Sep 16, 2023  2228 X-ray shows left distal femur fracture, likely source of patient's pain.  Still not sure whether patient is actually ambulatory at baseline.  Reviewing previous hospital notes  patient was not to be nonambulatory.  Patient is confused and clearly cannot provide any clear history.  Discussed  with Dr. Deatra Face, they will consult.  Discussed with Dr. [facility]ender, he will admit patient.  Still pending urinalysis, discussed with nursing attempting cath urine. [WS]    Clinical Course User Index [WS] Mordecai Applebaum, MD     Additional history obtained: -Additional history obtained from ems -External records from outside source obtained and reviewed including: Chart review including previous notes, labs, imaging, consultation notes including recent d/c summary   Lab Tests: -I ordered, reviewed, and interpreted labs.   The pertinent results include:   Labs Reviewed  CBC WITH DIFFERENTIAL/PLATELET - Abnormal; Notable for the following components:      Result Value   WBC 14.2 (*)    Hemoglobin 15.5 (*)    HCT 48.3 (*)    MCV 111.5 (*)    MCH 35.8 (*)    Neutro Abs 10.5 (*)    All other components within normal limits  COMPREHENSIVE METABOLIC PANEL WITH GFR - Abnormal; Notable for the following components:   Potassium 3.4 (*)    Glucose, Bld 126 (*)    Calcium  7.9 (*)    Total Protein 4.4 (*)    Albumin 1.8 (*)    All other components within normal limits  URINALYSIS, W/ REFLEX TO CULTURE (INFECTION SUSPECTED)    Notable for mild leukocytosis, hemoconcentration. Mild hypokalemia   EKG   EKG Interpretation Date/Time:  Monday Sep 16 2023 17:21:39 EDT Ventricular Rate:  149 PR Interval:    QRS Duration:  62 QT Interval:  322 QTC Calculation: 507 R Axis:   71  Text Interpretation: Sinus tachycardia Septal infarct , age undetermined Abnormal ECG When compared with ECG of 04-Sep-2023 17:17, No significant change since last tracing Confirmed by Hiawatha Lout ([id number]) on 09/16/2023 5:54:31 PM         Imaging Studies ordered: I ordered imaging studies including numerous xrays  On my interpretation imaging demonstrates distal left femur fx  I independently visualized and interpreted imaging. I agree with the radiologist interpretation   Medicines ordered and  prescription drug management: Meds ordered this encounter  Medications   fentaNYL  (SUBLIMAZE ) injection 50 mcg   sodium chloride  0.9 % bolus 1,000 mL   haloperidol  lactate (HALDOL ) injection 2 mg   LORazepam  (ATIVAN ) injection 1 mg    -I have reviewed the patients home medicines and have made adjustments as needed   Consultations Obtained: I requested consultation with the orthopedist,  and discussed lab and imaging findings as well as pertinent plan - they recommend: admission   Cardiac Monitoring: The patient was maintained on a cardiac monitor.  I personally viewed and interpreted the cardiac monitored which showed an underlying rhythm of: sinus tachycardia     Reevaluation: After the interventions noted above, I reevaluated the patient and found that their symptoms have improved  Co morbidities that complicate the patient evaluation  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[facility])    Metabolic encephalopathy    Osteogenesis imperfecta    Osteogenesis imperfecta  Osteoporosis    Personality disorder (HCC)    PVD (peripheral vascular disease) (HCC)    Stroke (HCC)       Dispostion: Disposition decision including need for hospitalization was considered, and patient admitted to the hospital.    Final Clinical Impression(s) / ED Diagnoses Final diagnoses:  Closed fracture of distal end of left femur, unspecified fracture morphology, initial encounter [facility])     This chart was dictated using voice recognition software.  Despite best efforts to proofread,  errors can occur which can change the documentation meaning.     Mordecai Applebaum, MD 09/16/23 2235

## 2023-09-16 NOTE — ED Notes (Signed)
 Pt incontinent of stool x 1. Cleaned and cath'd for urine. Pt agitated but tolerated.

## 2023-09-17 ENCOUNTER — Encounter (HOSPITAL_COMMUNITY): Payer: Self-pay | Admitting: Certified Registered Nurse Anesthetist

## 2023-09-17 ENCOUNTER — Encounter (HOSPITAL_COMMUNITY): Payer: Self-pay | Admitting: Internal Medicine

## 2023-09-17 ENCOUNTER — Inpatient Hospital Stay (HOSPITAL_COMMUNITY)

## 2023-09-17 ENCOUNTER — Inpatient Hospital Stay (HOSPITAL_COMMUNITY): Admitting: Anesthesiology

## 2023-09-17 DIAGNOSIS — Z66 Do not resuscitate: Secondary | ICD-10-CM | POA: Diagnosis present

## 2023-09-17 DIAGNOSIS — F418 Other specified anxiety disorders: Secondary | ICD-10-CM | POA: Diagnosis not present

## 2023-09-17 DIAGNOSIS — Z515 Encounter for palliative care: Secondary | ICD-10-CM | POA: Diagnosis not present

## 2023-09-17 DIAGNOSIS — I739 Peripheral vascular disease, unspecified: Secondary | ICD-10-CM | POA: Diagnosis present

## 2023-09-17 DIAGNOSIS — Z6821 Body mass index (BMI) 21.0-21.9, adult: Secondary | ICD-10-CM | POA: Diagnosis not present

## 2023-09-17 DIAGNOSIS — M542 Cervicalgia: Secondary | ICD-10-CM | POA: Diagnosis present

## 2023-09-17 DIAGNOSIS — G894 Chronic pain syndrome: Secondary | ICD-10-CM | POA: Diagnosis present

## 2023-09-17 DIAGNOSIS — S72452A Displaced supracondylar fracture without intracondylar extension of lower end of left femur, initial encounter for closed fracture: Secondary | ICD-10-CM | POA: Diagnosis present

## 2023-09-17 DIAGNOSIS — S72402A Unspecified fracture of lower end of left femur, initial encounter for closed fracture: Secondary | ICD-10-CM

## 2023-09-17 DIAGNOSIS — E876 Hypokalemia: Secondary | ICD-10-CM | POA: Diagnosis present

## 2023-09-17 DIAGNOSIS — E440 Moderate protein-calorie malnutrition: Secondary | ICD-10-CM | POA: Insufficient documentation

## 2023-09-17 DIAGNOSIS — Q780 Osteogenesis imperfecta: Secondary | ICD-10-CM | POA: Diagnosis not present

## 2023-09-17 DIAGNOSIS — F319 Bipolar disorder, unspecified: Secondary | ICD-10-CM | POA: Diagnosis present

## 2023-09-17 DIAGNOSIS — Z7189 Other specified counseling: Secondary | ICD-10-CM | POA: Diagnosis not present

## 2023-09-17 DIAGNOSIS — R0609 Other forms of dyspnea: Secondary | ICD-10-CM

## 2023-09-17 DIAGNOSIS — Z96642 Presence of left artificial hip joint: Secondary | ICD-10-CM | POA: Diagnosis present

## 2023-09-17 DIAGNOSIS — Z823 Family history of stroke: Secondary | ICD-10-CM | POA: Diagnosis not present

## 2023-09-17 DIAGNOSIS — Z8673 Personal history of transient ischemic attack (TIA), and cerebral infarction without residual deficits: Secondary | ICD-10-CM | POA: Diagnosis not present

## 2023-09-17 DIAGNOSIS — R000 Tachycardia, unspecified: Secondary | ICD-10-CM | POA: Diagnosis not present

## 2023-09-17 DIAGNOSIS — Z7982 Long term (current) use of aspirin: Secondary | ICD-10-CM | POA: Diagnosis not present

## 2023-09-17 DIAGNOSIS — D72829 Elevated white blood cell count, unspecified: Secondary | ICD-10-CM | POA: Insufficient documentation

## 2023-09-17 DIAGNOSIS — E785 Hyperlipidemia, unspecified: Secondary | ICD-10-CM | POA: Diagnosis present

## 2023-09-17 DIAGNOSIS — F609 Personality disorder, unspecified: Secondary | ICD-10-CM | POA: Diagnosis present

## 2023-09-17 DIAGNOSIS — Z79899 Other long term (current) drug therapy: Secondary | ICD-10-CM | POA: Diagnosis not present

## 2023-09-17 DIAGNOSIS — Z7689 Persons encountering health services in other specified circumstances: Secondary | ICD-10-CM | POA: Diagnosis present

## 2023-09-17 DIAGNOSIS — D62 Acute posthemorrhagic anemia: Secondary | ICD-10-CM | POA: Diagnosis not present

## 2023-09-17 DIAGNOSIS — K219 Gastro-esophageal reflux disease without esophagitis: Secondary | ICD-10-CM | POA: Diagnosis present

## 2023-09-17 DIAGNOSIS — Z8249 Family history of ischemic heart disease and other diseases of the circulatory system: Secondary | ICD-10-CM | POA: Diagnosis not present

## 2023-09-17 DIAGNOSIS — I10 Essential (primary) hypertension: Secondary | ICD-10-CM | POA: Diagnosis present

## 2023-09-17 DIAGNOSIS — R4189 Other symptoms and signs involving cognitive functions and awareness: Secondary | ICD-10-CM | POA: Diagnosis present

## 2023-09-17 DIAGNOSIS — G40909 Epilepsy, unspecified, not intractable, without status epilepticus: Secondary | ICD-10-CM | POA: Diagnosis present

## 2023-09-17 DIAGNOSIS — S7290XA Unspecified fracture of unspecified femur, initial encounter for closed fracture: Secondary | ICD-10-CM | POA: Diagnosis present

## 2023-09-17 DIAGNOSIS — W010XXA Fall on same level from slipping, tripping and stumbling without subsequent striking against object, initial encounter: Secondary | ICD-10-CM | POA: Diagnosis present

## 2023-09-17 DIAGNOSIS — Z87891 Personal history of nicotine dependence: Secondary | ICD-10-CM | POA: Diagnosis not present

## 2023-09-17 LAB — COMPREHENSIVE METABOLIC PANEL WITH GFR
ALT: 38 U/L (ref 0–44)
AST: 34 U/L (ref 15–41)
Albumin: 2.3 g/dL — ABNORMAL LOW (ref 3.5–5.0)
Alkaline Phosphatase: 89 U/L (ref 38–126)
Anion gap: 12 (ref 5–15)
BUN: 13 mg/dL (ref 8–23)
CO2: 24 mmol/L (ref 22–32)
Calcium: 8.4 mg/dL — ABNORMAL LOW (ref 8.9–10.3)
Chloride: 106 mmol/L (ref 98–111)
Creatinine, Ser: 0.44 mg/dL (ref 0.44–1.00)
GFR, Estimated: >60 mL/min (ref >60)
Glucose, Bld: 91 mg/dL (ref 70–99)
Potassium: 4.1 mmol/L (ref 3.5–5.1)
Sodium: 142 mmol/L (ref 135–145)
Total Bilirubin: 0.8 mg/dL (ref 0.0–1.2)
Total Protein: 5.4 g/dL — ABNORMAL LOW (ref 6.5–8.1)

## 2023-09-17 LAB — TSH: TSH: 0.878 u[IU]/mL (ref 0.350–4.500)

## 2023-09-17 LAB — GLUCOSE, CAPILLARY
Glucose-Capillary: 78 mg/dL (ref 70–99)
Glucose-Capillary: 75 mg/dL (ref 70–99)
Glucose-Capillary: 81 mg/dL (ref 70–99)

## 2023-09-17 LAB — CBC
HCT: 49.5 % — ABNORMAL HIGH (ref 36.0–46.0)
Hemoglobin: 16.4 g/dL — ABNORMAL HIGH (ref 12.0–15.0)
MCH: 36.2 pg — ABNORMAL HIGH (ref 26.0–34.0)
MCHC: 33.1 g/dL (ref 30.0–36.0)
MCV: 109.3 fL — ABNORMAL HIGH (ref 80.0–100.0)
Platelets: 243 10*3/uL (ref 150–400)
RBC: 4.53 MIL/uL (ref 3.87–5.11)
RDW: 13.2 % (ref 11.5–15.5)
WBC: 9 10*3/uL (ref 4.0–10.5)
nRBC: 0 % (ref 0.0–0.2)

## 2023-09-17 LAB — FOLATE: Folate: 13.4 ng/mL (ref >5.9)

## 2023-09-17 LAB — VITAMIN B12: Vitamin B-12: 1708 pg/mL — ABNORMAL HIGH (ref 180–914)

## 2023-09-17 LAB — ECHOCARDIOGRAM COMPLETE
AR max vel: 1.72 cm2
AV Peak grad: 5.4 mmHg
Ao pk vel: 1.16 m/s
Area-P 1/2: 6.65 cm2
S' Lateral: 2.5 cm

## 2023-09-17 LAB — SURGICAL PCR SCREEN
MRSA, PCR: NEGATIVE
Staphylococcus aureus: NEGATIVE

## 2023-09-17 MED ORDER — ONDANSETRON HCL 4 MG PO TABS: 4.0000 mg | ORAL_TABLET | Freq: Four times a day (QID) | ORAL | Status: DC | PRN

## 2023-09-17 MED ORDER — DULOXETINE HCL 30 MG PO CPEP
30.0000 mg | ORAL_CAPSULE | Freq: Two times a day (BID) | ORAL | Status: DC
  Administered 2023-09-17 – 2023-09-20 (×4): 30 mg via ORAL
  Filled 2023-09-17 (×6): qty 1

## 2023-09-17 MED ORDER — ROPIVACAINE HCL 5 MG/ML IJ SOLN
INTRAMUSCULAR | Status: DC | PRN
  Administered 2023-09-17: 20 mL via PERINEURAL

## 2023-09-17 MED ORDER — LORAZEPAM 0.5 MG PO TABS
0.5000 mg | ORAL_TABLET | Freq: Four times a day (QID) | ORAL | Status: DC | PRN
  Administered 2023-09-19: 0.5 mg via ORAL
  Filled 2023-09-17: qty 1

## 2023-09-17 MED ORDER — OXYCODONE HCL 5 MG PO TABS: 5.0000 mg | ORAL_TABLET | ORAL | Status: DC | PRN

## 2023-09-17 MED ORDER — DIVALPROEX SODIUM ER 250 MG PO TB24
250.0000 mg | ORAL_TABLET | Freq: Every day | ORAL | Status: DC
  Administered 2023-09-17: 250 mg via ORAL
  Filled 2023-09-17 (×4): qty 1

## 2023-09-17 MED ORDER — METOPROLOL TARTRATE 5 MG/5ML IV SOLN: 2.5000 mg | Freq: Four times a day (QID) | INTRAVENOUS | Status: DC | PRN

## 2023-09-17 MED ORDER — ACETAMINOPHEN 325 MG PO TABS: 650.0000 mg | ORAL_TABLET | Freq: Four times a day (QID) | ORAL | Status: DC | PRN

## 2023-09-17 MED ORDER — LEVETIRACETAM 500 MG PO TABS
500.0000 mg | ORAL_TABLET | Freq: Two times a day (BID) | ORAL | Status: DC
  Administered 2023-09-17 – 2023-09-20 (×5): 500 mg via ORAL
  Filled 2023-09-17 (×6): qty 1

## 2023-09-17 MED ORDER — SODIUM CHLORIDE 0.9 % IV BOLUS
500.0000 mL | Freq: Once | INTRAVENOUS | Status: AC
Start: 2023-09-17 — End: 2023-09-17
  Administered 2023-09-17: 500 mL via INTRAVENOUS

## 2023-09-17 MED ORDER — PROCHLORPERAZINE EDISYLATE 10 MG/2ML IJ SOLN: 10.0000 mg | Freq: Four times a day (QID) | INTRAMUSCULAR | Status: DC | PRN

## 2023-09-17 MED ORDER — ACETAMINOPHEN 650 MG RE SUPP: 650.0000 mg | Freq: Four times a day (QID) | RECTAL | Status: DC | PRN

## 2023-09-17 MED ORDER — SODIUM CHLORIDE 0.9 % IV SOLN: INTRAVENOUS | Status: DC

## 2023-09-17 MED ORDER — QUETIAPINE FUMARATE 25 MG PO TABS
12.5000 mg | ORAL_TABLET | Freq: Two times a day (BID) | ORAL | Status: DC
  Administered 2023-09-17 – 2023-09-20 (×5): 12.5 mg via ORAL
  Filled 2023-09-17 (×6): qty 1

## 2023-09-17 MED ORDER — ADULT MULTIVITAMIN W/MINERALS CH
1.0000 | ORAL_TABLET | Freq: Every day | ORAL | Status: DC
  Administered 2023-09-20: 1 via ORAL
  Filled 2023-09-17 (×3): qty 1

## 2023-09-17 MED ORDER — BISACODYL 10 MG RE SUPP: 10.0000 mg | Freq: Every day | RECTAL | Status: DC | PRN

## 2023-09-17 MED ORDER — METOPROLOL TARTRATE 12.5 MG HALF TABLET
12.5000 mg | ORAL_TABLET | Freq: Two times a day (BID) | ORAL | Status: DC
  Administered 2023-09-17 – 2023-09-20 (×4): 12.5 mg via ORAL
  Filled 2023-09-17 (×5): qty 1

## 2023-09-17 MED ORDER — CALCITONIN (SALMON) 200 UNIT/ACT NA SOLN
1.0000 | Freq: Every day | NASAL | Status: DC
  Administered 2023-09-17 – 2023-09-19 (×2): 1 via NASAL
  Filled 2023-09-17: qty 3.7

## 2023-09-17 MED ORDER — DEXAMETHASONE SODIUM PHOSPHATE 4 MG/ML IJ SOLN
INTRAMUSCULAR | Status: DC | PRN
  Administered 2023-09-17: 10 mg via PERINEURAL

## 2023-09-17 MED ORDER — GABAPENTIN 300 MG PO CAPS
300.0000 mg | ORAL_CAPSULE | Freq: Three times a day (TID) | ORAL | Status: DC
  Administered 2023-09-17 – 2023-09-20 (×6): 300 mg via ORAL
  Filled 2023-09-17 (×8): qty 1

## 2023-09-17 MED ORDER — ENSURE ENLIVE PO LIQD
237.0000 mL | Freq: Two times a day (BID) | ORAL | Status: DC
  Administered 2023-09-19 – 2023-09-20 (×4): 237 mL via ORAL

## 2023-09-17 MED ORDER — ONDANSETRON HCL 4 MG/2ML IJ SOLN: 4.0000 mg | Freq: Four times a day (QID) | INTRAMUSCULAR | Status: DC | PRN

## 2023-09-17 MED ORDER — CLONIDINE HCL (ANALGESIA) 100 MCG/ML EP SOLN
EPIDURAL | Status: DC | PRN
  Administered 2023-09-17: 50 ug

## 2023-09-17 MED ORDER — MORPHINE SULFATE (PF) 2 MG/ML IV SOLN: 0.5000 mg | INTRAVENOUS | Status: DC | PRN

## 2023-09-17 MED ORDER — POTASSIUM CHLORIDE 20 MEQ PO PACK
20.0000 meq | PACK | Freq: Once | ORAL | Status: AC
  Administered 2023-09-17: 20 meq via ORAL
  Filled 2023-09-17: qty 1

## 2023-09-17 NOTE — Progress Notes (Signed)
 Echocardiogram 2D Echocardiogram has been performed.  Rose Bridges 09/17/2023, 5:22 PM

## 2023-09-17 NOTE — Consult Note (Signed)
 Reason for Consult:Left distal femur fx Referring Physician: Charlean Congress Time called: 0730 Time at bedside: 0856   Rose Bridges is an 63 y.o. female.  HPI: Rose Bridges fell at home yesterday. She says she was supposed to using a RW or cane but they were placed out of reach. She was brought to the ED where workup showed a left distal femur fx and orthopedic surgery was consulted. She lives with her brother.  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[facility])    Metabolic encephalopathy    Osteogenesis imperfecta    Osteogenesis imperfecta    Osteoporosis    Personality disorder (HCC)    PVD (peripheral vascular disease) (HCC)    Stroke [facility])     Past Surgical History:  Procedure Laterality Date   ABDOMINAL HYSTERECTOMY     BIOPSY  07/05/2022   Procedure: BIOPSY;  Surgeon: Normie Becton., MD;  Location: [facility];  Service: Gastroenterology;;   ESOPHAGOGASTRODUODENOSCOPY N/A 07/05/2022   Procedure: ESOPHAGOGASTRODUODENOSCOPY (EGD);  Surgeon: Normie Becton., MD;  Location: [facility];  Service: Gastroenterology;  Laterality: N/A;   EUS N/A 07/05/2022   Procedure: UPPER ENDOSCOPIC ULTRASOUND (EUS) LINEAR;  Surgeon: Normie Becton., MD;  Location: [facility];  Service: Gastroenterology;  Laterality: N/A;   HIP ARTHROPLASTY Left 09/26/2015   Procedure: ARTHROPLASTY BIPOLAR HIP (HEMIARTHROPLASTY);  Surgeon: Saundra Curl, MD;  Location: [facility] OR;  Service: Orthopedics;  Laterality: Left;   ORIF HUMERUS FRACTURE Right 11/06/2016   Procedure: OPEN REDUCTION INTERNAL FIXATION (ORIF) PROXIMAL HUMERUS FRACTURE;  Surgeon:  Saundra Curl, MD;  Location: [facility] OR;  Service: Orthopedics;  Laterality: Right;    Family History  Problem Relation Age of Onset   Stroke Mother    Heart attack Mother    Hypertension Mother    Stroke Sister    Stroke Brother    Lung cancer Father     Social History:  reports that she quit smoking about 5 years ago. Her smoking use included cigarettes. She has never used smokeless tobacco. She reports that she does not drink alcohol and does not use drugs.  Allergies:  Allergies  Allergen Reactions   Codeine Other (See Comments)    Migraines     Medications: I have reviewed the patient's current medications.  Results for orders placed or performed during the hospital encounter of 09/16/23 (from the past 48 hours)  Urinalysis, w/ Reflex to Culture (Infection Suspected) -Urine, Clean Catch     Status: None   Collection Time: 09/16/23  6:03 PM  Result Value Ref Range   Specimen Source URINE, CLEAN CATCH    Color, Urine YELLOW YELLOW   APPearance CLEAR CLEAR   Specific Gravity, Urine 1.030 1.005 - 1.030   pH 5.0 5.0 - 8.0   Glucose, UA NEGATIVE NEGATIVE mg/dL   Hgb urine dipstick NEGATIVE NEGATIVE   Bilirubin Urine NEGATIVE NEGATIVE   Ketones, ur NEGATIVE NEGATIVE mg/dL   Protein, ur NEGATIVE NEGATIVE mg/dL   Nitrite NEGATIVE NEGATIVE   Leukocytes,Ua NEGATIVE NEGATIVE   RBC / HPF 0-5 0 - 5 RBC/hpf   WBC, UA  0-5 0 - 5 WBC/hpf    Comment:        Reflex urine culture not performed if WBC <=10, OR if Squamous epithelial cells >5. If Squamous epithelial cells >5 suggest recollection.    Bacteria, UA NONE SEEN NONE SEEN   Squamous Epithelial / HPF 0-5 0 - 5 /HPF   Mucus PRESENT    Hyaline Casts, UA PRESENT     Comment: Performed at [facility], [address], [place], [place] [address]  CBC with Differential     Status: Abnormal   Collection Time: 09/16/23  6:15 PM  Result Value Ref Range   WBC 14.2 (H) 4.0 - 10.5 K/uL   RBC 4.33 3.87 - 5.11 MIL/uL    Hemoglobin 15.5 (H) 12.0 - 15.0 g/dL   HCT 96.2 (H) 95.2 - 84.1 %   MCV 111.5 (H) 80.0 - 100.0 fL   MCH 35.8 (H) 26.0 - 34.0 pg   MCHC 32.1 30.0 - 36.0 g/dL   RDW 32.4 40.1 - 02.7 %   Platelets 239 150 - 400 K/uL   nRBC 0.0 0.0 - 0.2 %   Neutrophils Relative % 74 %   Neutro Abs 10.5 (H) 1.7 - 7.7 K/uL   Lymphocytes Relative 19 %   Lymphs Abs 2.7 0.7 - 4.0 K/uL   Monocytes Relative 5 %   Monocytes Absolute 0.7 0.1 - 1.0 K/uL   Eosinophils Relative 1 %   Eosinophils Absolute 0.1 0.0 - 0.5 K/uL   Basophils Relative 0 %   Basophils Absolute 0.1 0.0 - 0.1 K/uL   WBC Morphology MORPHOLOGY UNREMARKABLE    RBC Morphology MORPHOLOGY UNREMARKABLE    Smear Review Normal platelet morphology    Immature Granulocytes 1 %   Abs Immature Granulocytes 0.07 0.00 - 0.07 K/uL    Comment: Performed at [facility], [address], [place], [place] [address]  Comprehensive metabolic panel with GFR     Status: Abnormal   Collection Time: 09/16/23  8:52 PM  Result Value Ref Range   Sodium 141 135 - 145 mmol/L   Potassium 3.4 (L) 3.5 - 5.1 mmol/L   Chloride 109 98 - 111 mmol/L   CO2 27 22 - 32 mmol/L   Glucose, Bld 126 (H) 70 - 99 mg/dL    Comment: Glucose reference range applies only to samples taken after fasting for at least 8 hours.   BUN 13 8 - 23 mg/dL   Creatinine, Ser 4.40 0.44 - 1.00 mg/dL   Calcium  7.9 (L) 8.9 - 10.3 mg/dL   Total Protein 4.4 (L) 6.5 - 8.1 g/dL   Albumin 1.8 (L) 3.5 - 5.0 g/dL   AST 30 15 - 41 U/L   ALT 35 0 - 44 U/L   Alkaline Phosphatase 76 38 - 126 U/L   Total Bilirubin 0.3 0.0 - 1.2 mg/dL   GFR, Estimated >34 >74 mL/min    Comment: (NOTE) Calculated using the CKD-EPI Creatinine Equation (2021)    Anion gap 5 5 - 15    Comment: Performed at [facility], [address], [place], [place] [address]  Glucose, capillary     Status: None   Collection Time: 09/17/23  6:12 AM  Result Value Ref Range   Glucose-Capillary 78 70 - 99 mg/dL    Comment: Glucose  reference range applies only to samples taken after fasting for at least 8 hours.    DG HIP UNILAT WITH PELVIS 2-3 VIEWS LEFT Result  Date: 09/16/2023 CLINICAL DATA:  Fall, pain EXAM: DG HIP (WITH OR WITHOUT PELVIS) 2-3V LEFT COMPARISON:  None Available. FINDINGS: Diffuse osteopenia. Prior left hip replacement. No hardware complicating feature. No acute bony abnormality. Specifically, no fracture, subluxation, or dislocation. IMPRESSION: Osteopenia.  No acute bony abnormality. Electronically Signed   By: Janeece Mechanic M.D.   On: 09/16/2023 21:37   DG Chest 1 View Result Date: 09/16/2023 CLINICAL DATA:  Fall, pain EXAM: CHEST  1 VIEW COMPARISON:  05/17/2023 FINDINGS: Heart and mediastinal contours are within normal limits. No focal opacities or effusions. No acute bony abnormality. Calcifications noted in the breasts bilaterally. IMPRESSION: No active disease. Electronically Signed   By: Janeece Mechanic M.D.   On: 09/16/2023 21:37   DG Knee 1-2 Views Left Result Date: 09/16/2023 CLINICAL DATA:  Pain EXAM: LEFT KNEE - 1-2 VIEW COMPARISON:  None Available. FINDINGS: The bones are diffusely osteopenic. There is an acute oblique fracture through the distal femoral diaphysis without significant displacement or angulation. Joint spaces are maintained and alignment is anatomic. Soft tissues are within normal limits. IMPRESSION: Acute oblique fracture through the distal femoral diaphysis. Electronically Signed   By: Tyron Gallon M.D.   On: 09/16/2023 21:36   DG HIP UNILAT WITH PELVIS 2-3 VIEWS RIGHT Result Date: 09/16/2023 CLINICAL DATA:  Fall, pain EXAM: DG HIP (WITH OR WITHOUT PELVIS) 2-3V RIGHT COMPARISON:  None Available. FINDINGS: There is no evidence of hip fracture or dislocation. There is no evidence of arthropathy or other focal bone abnormality. IMPRESSION: Negative. Electronically Signed   By: Janeece Mechanic M.D.   On: 09/16/2023 21:35   DG Foot 2 Views Left Result Date: 09/16/2023 CLINICAL DATA:  Fall,  pain EXAM: LEFT FOOT - 2 VIEW COMPARISON:  None Available. FINDINGS: Diffuse osteopenia. No acute bony abnormality. Specifically, no fracture, subluxation, or dislocation. IMPRESSION: Osteopenia.  No acute bony abnormality. Electronically Signed   By: Janeece Mechanic M.D.   On: 09/16/2023 21:35   DG Foot 2 Views Right Result Date: 09/16/2023 CLINICAL DATA:  Fall, pain EXAM: RIGHT FOOT - 2 VIEW COMPARISON:  None Available. FINDINGS: Osteopenia. No acute bony abnormality. Specifically, no fracture, subluxation, or dislocation. IMPRESSION: Osteopenia.  No acute bony abnormality. Electronically Signed   By: Janeece Mechanic M.D.   On: 09/16/2023 21:34   DG Ankle 2 Views Right Result Date: 09/16/2023 CLINICAL DATA:  Fall, pain EXAM: RIGHT ANKLE - 2 VIEW COMPARISON:  None Available. FINDINGS: Diffuse osteopenia. No acute bony abnormality. Specifically, no fracture, subluxation, or dislocation. IMPRESSION: Osteopenia.  No acute bony abnormality. Electronically Signed   By: Janeece Mechanic M.D.   On: 09/16/2023 21:34   DG Ankle 2 Views Left Result Date: 09/16/2023 CLINICAL DATA:  Fall, pain EXAM: LEFT ANKLE - 2 VIEW COMPARISON:  None Available. FINDINGS: Diffuse osteopenia. No acute bony abnormality. Specifically, no fracture, subluxation, or dislocation. IMPRESSION: No acute bony abnormality.  Osteopenia Electronically Signed   By: Janeece Mechanic M.D.   On: 09/16/2023 21:33   DG Knee 1-2 Views Right Result Date: 09/16/2023 CLINICAL DATA:  Fall, pain EXAM: RIGHT KNEE - 1-2 VIEW COMPARISON:  None Available. FINDINGS: Diffuse osteopenia. No acute bony abnormality. Specifically, no fracture, subluxation, or dislocation. No joint effusion. IMPRESSION: Osteopenia.  No acute bony abnormality. Electronically Signed   By: Janeece Mechanic M.D.   On: 09/16/2023 21:33   CT Head Wo Contrast Result Date: 09/16/2023 CLINICAL DATA:  Head and neck trauma EXAM: CT HEAD WITHOUT CONTRAST CT CERVICAL SPINE WITHOUT  CONTRAST TECHNIQUE:  Multidetector CT imaging of the head and cervical spine was performed following the standard protocol without intravenous contrast. Multiplanar CT image reconstructions of the cervical spine were also generated. RADIATION DOSE REDUCTION: This exam was performed according to the departmental dose-optimization program which includes automated exposure control, adjustment of the mA and/or kV according to patient size and/or use of iterative reconstruction technique. COMPARISON:  CT brain 09/01/2023, 05/28/2023, CT cervical spine 04/19/2018 FINDINGS: CT HEAD FINDINGS Brain: No acute territorial infarction, hemorrhage or intracranial mass. Moderate atrophy.moderate chronic small vessel ischemic changes of the white matter. Multiple chronic appearing lacunar infarcts within the bilateral basal ganglia and thalami. Stable ventricle size. Vascular: No hyperdense vessels.  Carotid vascular calcification Skull: No fracture Sinuses/Orbits: Mucosal thickening in the sinuses Other: None CT CERVICAL SPINE FINDINGS Alignment: No subluxation.  Facet alignment is within normal limits. Skull base and vertebrae: No acute fracture. No primary bone lesion or focal pathologic process. Soft tissues and spinal canal: No prevertebral fluid or swelling. No visible canal hematoma. Disc levels:  Mild degenerative changes C4-C5, C5-C6 and C6-C7 Upper chest: Negative. Other: None IMPRESSION: 1. Limited by habitus and positioning. No definite CT evidence for acute intracranial abnormality. Atrophy and chronic small vessel ischemic changes of the white matter. Multiple chronic appearing lacunar infarcts within the bilateral basal ganglia and thalami. 2. Degenerative changes of the cervical spine. No acute osseous abnormality. Electronically Signed   By: Esmeralda Hedge M.D.   On: 09/16/2023 19:42   CT Cervical Spine Wo Contrast Result Date: 09/16/2023 CLINICAL DATA:  Head and neck trauma EXAM: CT HEAD WITHOUT CONTRAST CT CERVICAL SPINE WITHOUT  CONTRAST TECHNIQUE: Multidetector CT imaging of the head and cervical spine was performed following the standard protocol without intravenous contrast. Multiplanar CT image reconstructions of the cervical spine were also generated. RADIATION DOSE REDUCTION: This exam was performed according to the departmental dose-optimization program which includes automated exposure control, adjustment of the mA and/or kV according to patient size and/or use of iterative reconstruction technique. COMPARISON:  CT brain 09/01/2023, 05/28/2023, CT cervical spine 04/19/2018 FINDINGS: CT HEAD FINDINGS Brain: No acute territorial infarction, hemorrhage or intracranial mass. Moderate atrophy.moderate chronic small vessel ischemic changes of the white matter. Multiple chronic appearing lacunar infarcts within the bilateral basal ganglia and thalami. Stable ventricle size. Vascular: No hyperdense vessels.  Carotid vascular calcification Skull: No fracture Sinuses/Orbits: Mucosal thickening in the sinuses Other: None CT CERVICAL SPINE FINDINGS Alignment: No subluxation.  Facet alignment is within normal limits. Skull base and vertebrae: No acute fracture. No primary bone lesion or focal pathologic process. Soft tissues and spinal canal: No prevertebral fluid or swelling. No visible canal hematoma. Disc levels:  Mild degenerative changes C4-C5, C5-C6 and C6-C7 Upper chest: Negative. Other: None IMPRESSION: 1. Limited by habitus and positioning. No definite CT evidence for acute intracranial abnormality. Atrophy and chronic small vessel ischemic changes of the white matter. Multiple chronic appearing lacunar infarcts within the bilateral basal ganglia and thalami. 2. Degenerative changes of the cervical spine. No acute osseous abnormality. Electronically Signed   By: Esmeralda Hedge M.D.   On: 09/16/2023 19:42    Review of Systems  HENT:  Negative for ear discharge, ear pain, hearing loss and tinnitus.   Eyes:  Negative for photophobia  and pain.  Respiratory:  Negative for cough and shortness of breath.   Cardiovascular:  Negative for chest pain.  Gastrointestinal:  Negative for abdominal pain, nausea and vomiting.  Genitourinary:  Negative for dysuria, flank  pain, frequency and urgency.  Musculoskeletal:  Positive for arthralgias (Left knee, bilateral toes). Negative for back pain, myalgias and neck pain.  Neurological:  Negative for dizziness and headaches.  Hematological:  Does not bruise/bleed easily.  Psychiatric/Behavioral:  The patient is not nervous/anxious.    Blood pressure 114/87, pulse 86, temperature 98.8 F (37.1 C), temperature source Axillary, resp. rate 16, SpO2 98%. Physical Exam Constitutional:      General: She is not in acute distress.    Appearance: She is well-developed. She is not diaphoretic.  HENT:     Head: Normocephalic and atraumatic.  Eyes:     General: No scleral icterus.       Right eye: No discharge.        Left eye: No discharge.     Conjunctiva/sclera: Conjunctivae normal.  Neck:     Comments: C-collar. Post midline TTP. Cardiovascular:     Rate and Rhythm: Normal rate and regular rhythm.  Pulmonary:     Effort: Pulmonary effort is normal. No respiratory distress.  Musculoskeletal:     Cervical back: Tenderness present.     Comments: LLE No traumatic wounds, ecchymosis, or rash  Mod TTP knee  No knee or ankle effusion  Sens DPN, SPN, TN intact  Motor EHL, ext, flex, evers 5/5  DP 1+, PT 0, No significant edema  Skin:    General: Skin is warm and dry.  Neurological:     Mental Status: She is alert.  Psychiatric:        Mood and Affect: Mood normal.        Behavior: Behavior normal.     Assessment/Plan: Left distal femur fx -- Plan ORIF today with Dr. Guyann Leitz. Please keep NPO. Neck pain -- Will get flex/ex views    Georganna Kin, PA-C Orthopedic Surgery [phone] 09/17/2023, 9:08 AM

## 2023-09-17 NOTE — Progress Notes (Addendum)
 [facility] Progress Note Patient: Gwenna Fuston HQI:[id number] DOB: 1960-10-11 DOA: 09/16/2023  DOS: the patient was seen and examined on 09/17/2023  Brief Hospital Course: Patient with PMH of seizures, depression, osteogenesis imperfecta, bipolar disorder, chronic pain presented to the hospital with a fall at the [facility]. It is unclear whether the patient actually had a fall while walking but reportedly she tripped and sustained leg pain. X-ray in the ED showed left distal femur fracture.  Orthopedic and hospitalist service were consulted and patient was admitted. From the recent PT notes on 5/6, at baseline the patient is nonambulatory and LTC SNF resident and has been noncompliant.  Assessment and Plan: Left distal femur fracture Most likely sustained after a mechanical fall. Orthopedic surgery consulted. Patient was originally scheduled for surgery on 5/20 but now it has been postponed.  Preoperative medical evaluation Margarita Shear al. Estimated Risk Probability for Perioperative Myocardial Infarction or Cardiac Arrest: 0.7 Echocardiogram shows preserved EF, no valvular abnormalities no wall motion abnormality. Currently the patient does not have prohibitive risk although in the presence of osteogenesis imperfecta and poor functional status at baseline prognosis is guarded.  And risk for poor outcome is moderate. Orthopedic surgery will discuss considered an option with patient's daughter.  Goals of care conversation. Discussed with daughter on the phone. She wants the patient to be DNR based on the conversation with palliative care during last admission.  Will switch to DNR in the hospital.  Bipolar disorder  on Cymbalta  Depakote Seroquel .  On as needed Ativan  for anxiety.  History of seizures  on Keppra .  Patient is on gabapentin  could be for seizures or neuropathy.  Mild hypokalemia  Replaced  Cognitive impairment  per patient's daughter patient is  scheduled to follow-up with neurologist in few months.  Sinus tachycardia  TSH is normal.  Recently was admitted and had some SVTs. Provide IV fluid.  Initiate Lopressor  Monitor.  Osteogenesis imperfecta. Continue home regimen.  History of seizures. On Keppra  and Depakote. No recent seizures. For now we will continue.  Moderate protein calorie malnutrition  Interventions: Ensure Enlive (each supplement provides 350kcal and 20 grams of protein), Magic cup, MVI  Subjective: No acute complaint.  No nausea or vomiting.  Has some pain in her leg but well-controlled.  Speech is somewhat muffled.  Physical Exam: General: in Mild distress, No Rash Cardiovascular: S1 and S2 Present, No Murmur Respiratory: Good respiratory effort, Bilateral Air entry present. No Crackles, No wheezes Abdomen: Bowel Sound present, No tenderness Extremities: No edema Neuro: Alert and oriented x3, no new focal deficit, muffled speech likely due to C-spine collar.  Data Reviewed: I have Reviewed nursing notes, Vitals, and Lab results. Since last encounter, pertinent lab results CBC and BMP   . I have ordered test including BC and BMP  . I have discussed pt's care plan and test results with orthopedics  .   Disposition: Status is: Inpatient Remains inpatient appropriate because: Surgery tomorrow  SCDs Start: 09/17/23 0340   Family Communication: Discussed with daughter on the phone Level of care: Telemetry Medical continue due to tachycardia Vitals:   09/16/23 2331 09/17/23 0440 09/17/23 0726 09/17/23 1417  BP: 117/84 (!) 116/98 114/87 108/68  Pulse: (!) 125 99 86   Resp: 19 17 16 16   Temp: 98.4 F (36.9 C) 98.8 F (37.1 C)  97.8 F (36.6 C)  TempSrc: Axillary Axillary    SpO2: 96% 98% 98% 96%     Author: Charlean Congress, MD 09/17/2023 7:04 PM  Please look on [email or web address] to find out who is on call.

## 2023-09-17 NOTE — Progress Notes (Signed)
 Patient brought to PACU per Dr. Allean [place] for Left side block.  Will assist with procedure and monitor patient.    2044 Time out performed.

## 2023-09-17 NOTE — Progress Notes (Signed)
 Initial Nutrition Assessment  DOCUMENTATION CODES:  Non-severe (moderate) malnutrition in context of chronic illness  INTERVENTION:  Once diet resumes, recommend: Dysphagia 3 diet for ease of chewing Magic cup TID with meals, each supplement provides 290 kcal and 9 grams of protein Ensure Enlive po BID, each supplement provides 350 kcal and 20 grams of protein. MVI with minerals daily Request updated measured weight  NUTRITION DIAGNOSIS:  Moderate Malnutrition related to chronic illness (cognitive impairment) as evidenced by mild fat depletion, moderate muscle depletion, severe muscle depletion  GOAL:  Patient will meet greater than or equal to 90% of their needs  MONITOR:  Diet advancement, Labs, Weight trends, Skin  REASON FOR ASSESSMENT:  Consult Hip fracture protocol  ASSESSMENT:  Pt admitted from SNF s/p fall leading to L distal femur fracture. PMH significant for seizures, depression and recent hospital admission about 2 weeks ago for metabolic encephalopathy d/t UTI and cognitive impairment.  Per Ortho, plans for ORIF of L femur fracture today.   Attempted to speak with pt at bedside however pt is a little difficult to understand at times and intermittently tearful. Provided therapeutic listening as pt expresses lots of frustrations over her fall.  She did not provide much detailed nutrition history though states that her appetite has been "piss poor." She reports that she does not want to eat anything that is prepared for her at home.  Pt with poor dentition therefore will likely benefit from a modified texture diet for ease of chewing following procedure today.   Review of chart reflects pt's weight to have gradually declined over the last year. No significant weight changes noted. Last updated weight was 05/08 however uncertain whether this is actual versus stated weight.  Medications:  reviewed  Labs (5/19):   Potassium 3.4 CBG 78  Vitamin D  last  checked/resulted 03/2022 as 13.50 (L).  Noted Vitamin D  supplementation on patients home medication libs.  Will obtain updated lab.   NUTRITION - FOCUSED PHYSICAL EXAM: Flowsheet Row Most Recent Value  Orbital Region Moderate depletion  Upper Arm Region No depletion  Thoracic and Lumbar Region No depletion  Buccal Region Mild depletion  Temple Region Mild depletion  Clavicle Bone Region Moderate depletion  Clavicle and Acromion Bone Region Severe depletion  Scapular Bone Region Severe depletion  Dorsal Hand Severe depletion  Patellar Region Moderate depletion  Anterior Thigh Region Moderate depletion  Posterior Calf Region Severe depletion  Edema (RD Assessment) None  Hair Reviewed  Eyes Reviewed  Mouth Reviewed  Skin Reviewed  Nails Reviewed   Diet Order:   Diet Order             Diet NPO time specified  Diet effective midnight           Diet regular Fluid consistency: Thin  Diet effective now                   EDUCATION NEEDS:   No education needs have been identified at this time  Skin:  Skin Assessment: Reviewed RN Assessment  Last BM:  PTA  Height:   Ht Readings from Last 1 Encounters:  09/02/23 5\' 4"  (1.626 m)    Weight:   Wt Readings from Last 1 Encounters:  09/05/23 57 kg    Ideal Body Weight:  54.5 kg  BMI:  There is no height or weight on file to calculate BMI.  Estimated Nutritional Needs:   Kcal:  1500-1700  Protein:  75-90g  Fluid:  >/=1.5L  Allie  Craigory Toste, RDN, LDN Clinical Nutrition See AMiON for contact information.

## 2023-09-17 NOTE — TOC CAGE-AID Note (Signed)
 Transition of Care [facility]) - CAGE-AID Screening   Patient Details  Name: Rose Bridges MRN: [id number] Date of Birth: 02/22/1961  Transition of [facility]) CM/SW Contact:    Raunak Antuna E Dequante Tremaine, LCSW Phone Number: 09/17/2023, 10:58 AM   Clinical Narrative: Denied substance use.   CAGE-AID Screening:    Have You Ever Felt You Ought to Cut Down on Your Drinking or Drug Use?: No Have People Annoyed You By Critizing Your Drinking Or Drug Use?: No Have You Felt Bad Or Guilty About Your Drinking Or Drug Use?: No Have You Ever Had a Drink or Used Drugs First Thing In The Morning to Steady Your Nerves or to Get Rid of a Hangover?: No CAGE-AID Score: 0  Substance Abuse Education Offered: No

## 2023-09-17 NOTE — Anesthesia Procedure Notes (Signed)
 Anesthesia Regional Block: Femoral nerve block   Pre-Anesthetic Checklist: , timeout performed,  Correct Patient, Correct Site, Correct Laterality,  Correct Procedure, Correct Position, site marked,  Risks and benefits discussed,  Surgical consent,  Pre-op evaluation,  At surgeon's request and post-op pain management  Laterality: Left  Prep: Maximum Sterile Barrier Precautions used, chloraprep       Needles:  Injection technique: Single-shot  Needle Type: Echogenic Needle      Needle Gauge: 20     Additional Needles:   Procedures:,,,, ultrasound used (permanent image in chart),,    Narrative:  Start time: 09/17/2023 8:40 AM End time: 09/17/2023 8:45 AM Injection made incrementally with aspirations every 5 mL.  Performed by: Personally  Anesthesiologist: Leslye Rast, MD

## 2023-09-17 NOTE — Anesthesia Preprocedure Evaluation (Signed)
 Anesthesia Evaluation  Patient identified by MRN, date of birth, ID band Patient awake and Patient confused    Reviewed: Allergy & Precautions, NPO status , Patient's Chart, lab work & pertinent test results, reviewed documented beta blocker date and time   History of Anesthesia Complications Negative for: history of anesthetic complications  Airway Mallampati: III     Mouth opening: Limited Mouth Opening  Dental  (+) Poor Dentition   Pulmonary former smoker   breath sounds clear to auscultation       Cardiovascular hypertension, + Peripheral Vascular Disease   Rhythm:Regular Rate:Normal     Neuro/Psych Seizures -,  PSYCHIATRIC DISORDERS Anxiety Depression Bipolar Disorder   CVA    GI/Hepatic ,GERD  ,,  Endo/Other    Renal/GU Renal disease     Musculoskeletal  (+) Arthritis ,  Osteogenesis imperfecta   Abdominal   Peds  Hematology   Anesthesia Other Findings   Reproductive/Obstetrics                             Anesthesia Physical Anesthesia Plan  ASA: 3  Anesthesia Plan: Regional   Post-op Pain Management: Regional block*   Induction:   PONV Risk Score and Plan:   Airway Management Planned:   Additional Equipment:   Intra-op Plan:   Post-operative Plan:   Informed Consent: I have reviewed the patients History and Physical, chart, labs and discussed the procedure including the risks, benefits and alternatives for the proposed anesthesia with the patient or authorized representative who has indicated his/her understanding and acceptance.     Consent reviewed with POA  Plan Discussed with:   Anesthesia Plan Comments: (Planned for surgery tomorrow afternoon; discussed risks and benefits of PNB with HCPOA who provided consent. )       Anesthesia Quick Evaluation

## 2023-09-17 NOTE — Progress Notes (Signed)
 Orthopedic Tech Progress Note Patient Details:  Rose Bridges 1960/07/16 [id number]  Ortho Devices Type of Ortho Device: Knee Immobilizer Ortho Device/Splint Location: LLE Ortho Device/Splint Interventions: Ordered, Application, Adjustment   Post Interventions Patient Tolerated: Fair Instructions Provided: Care of device  [name] 09/17/2023, 1:54 PM

## 2023-09-17 NOTE — H&P (View-Only) (Signed)
 Reason for Consult:Left distal femur fx Referring Physician: Charlean Congress Time called: 0730 Time at bedside: 0856   Rose Bridges is an 63 y.o. female.  HPI: Rose Bridges fell at home yesterday. She says she was supposed to using a RW or cane but they were placed out of reach. She was brought to the ED where workup showed a left distal femur fx and orthopedic surgery was consulted. She lives with her brother.  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[facility])    Metabolic encephalopathy    Osteogenesis imperfecta    Osteogenesis imperfecta    Osteoporosis    Personality disorder (HCC)    PVD (peripheral vascular disease) (HCC)    Stroke [facility])     Past Surgical History:  Procedure Laterality Date   ABDOMINAL HYSTERECTOMY     BIOPSY  07/05/2022   Procedure: BIOPSY;  Surgeon: Normie Becton., MD;  Location: [facility];  Service: Gastroenterology;;   ESOPHAGOGASTRODUODENOSCOPY N/A 07/05/2022   Procedure: ESOPHAGOGASTRODUODENOSCOPY (EGD);  Surgeon: Normie Becton., MD;  Location: [facility];  Service: Gastroenterology;  Laterality: N/A;   EUS N/A 07/05/2022   Procedure: UPPER ENDOSCOPIC ULTRASOUND (EUS) LINEAR;  Surgeon: Normie Becton., MD;  Location: [facility];  Service: Gastroenterology;  Laterality: N/A;   HIP ARTHROPLASTY Left 09/26/2015   Procedure: ARTHROPLASTY BIPOLAR HIP (HEMIARTHROPLASTY);  Surgeon: Saundra Curl, MD;  Location: [facility] OR;  Service: Orthopedics;  Laterality: Left;   ORIF HUMERUS FRACTURE Right 11/06/2016   Procedure: OPEN REDUCTION INTERNAL FIXATION (ORIF) PROXIMAL HUMERUS FRACTURE;  Surgeon:  Saundra Curl, MD;  Location: [facility] OR;  Service: Orthopedics;  Laterality: Right;    Family History  Problem Relation Age of Onset   Stroke Mother    Heart attack Mother    Hypertension Mother    Stroke Sister    Stroke Brother    Lung cancer Father     Social History:  reports that she quit smoking about 5 years ago. Her smoking use included cigarettes. She has never used smokeless tobacco. She reports that she does not drink alcohol and does not use drugs.  Allergies:  Allergies  Allergen Reactions   Codeine Other (See Comments)    Migraines     Medications: I have reviewed the patient's current medications.  Results for orders placed or performed during the hospital encounter of 09/16/23 (from the past 48 hours)  Urinalysis, w/ Reflex to Culture (Infection Suspected) -Urine, Clean Catch     Status: None   Collection Time: 09/16/23  6:03 PM  Result Value Ref Range   Specimen Source URINE, CLEAN CATCH    Color, Urine YELLOW YELLOW   APPearance CLEAR CLEAR   Specific Gravity, Urine 1.030 1.005 - 1.030   pH 5.0 5.0 - 8.0   Glucose, UA NEGATIVE NEGATIVE mg/dL   Hgb urine dipstick NEGATIVE NEGATIVE   Bilirubin Urine NEGATIVE NEGATIVE   Ketones, ur NEGATIVE NEGATIVE mg/dL   Protein, ur NEGATIVE NEGATIVE mg/dL   Nitrite NEGATIVE NEGATIVE   Leukocytes,Ua NEGATIVE NEGATIVE   RBC / HPF 0-5 0 - 5 RBC/hpf   WBC, UA  0-5 0 - 5 WBC/hpf    Comment:        Reflex urine culture not performed if WBC <=10, OR if Squamous epithelial cells >5. If Squamous epithelial cells >5 suggest recollection.    Bacteria, UA NONE SEEN NONE SEEN   Squamous Epithelial / HPF 0-5 0 - 5 /HPF   Mucus PRESENT    Hyaline Casts, UA PRESENT     Comment: Performed at [facility], [address], [place], [place] [address]  CBC with Differential     Status: Abnormal   Collection Time: 09/16/23  6:15 PM  Result Value Ref Range   WBC 14.2 (H) 4.0 - 10.5 K/uL   RBC 4.33 3.87 - 5.11 MIL/uL    Hemoglobin 15.5 (H) 12.0 - 15.0 g/dL   HCT 96.2 (H) 95.2 - 84.1 %   MCV 111.5 (H) 80.0 - 100.0 fL   MCH 35.8 (H) 26.0 - 34.0 pg   MCHC 32.1 30.0 - 36.0 g/dL   RDW 32.4 40.1 - 02.7 %   Platelets 239 150 - 400 K/uL   nRBC 0.0 0.0 - 0.2 %   Neutrophils Relative % 74 %   Neutro Abs 10.5 (H) 1.7 - 7.7 K/uL   Lymphocytes Relative 19 %   Lymphs Abs 2.7 0.7 - 4.0 K/uL   Monocytes Relative 5 %   Monocytes Absolute 0.7 0.1 - 1.0 K/uL   Eosinophils Relative 1 %   Eosinophils Absolute 0.1 0.0 - 0.5 K/uL   Basophils Relative 0 %   Basophils Absolute 0.1 0.0 - 0.1 K/uL   WBC Morphology MORPHOLOGY UNREMARKABLE    RBC Morphology MORPHOLOGY UNREMARKABLE    Smear Review Normal platelet morphology    Immature Granulocytes 1 %   Abs Immature Granulocytes 0.07 0.00 - 0.07 K/uL    Comment: Performed at [facility], [address], [place], [place] [address]  Comprehensive metabolic panel with GFR     Status: Abnormal   Collection Time: 09/16/23  8:52 PM  Result Value Ref Range   Sodium 141 135 - 145 mmol/L   Potassium 3.4 (L) 3.5 - 5.1 mmol/L   Chloride 109 98 - 111 mmol/L   CO2 27 22 - 32 mmol/L   Glucose, Bld 126 (H) 70 - 99 mg/dL    Comment: Glucose reference range applies only to samples taken after fasting for at least 8 hours.   BUN 13 8 - 23 mg/dL   Creatinine, Ser 4.40 0.44 - 1.00 mg/dL   Calcium  7.9 (L) 8.9 - 10.3 mg/dL   Total Protein 4.4 (L) 6.5 - 8.1 g/dL   Albumin 1.8 (L) 3.5 - 5.0 g/dL   AST 30 15 - 41 U/L   ALT 35 0 - 44 U/L   Alkaline Phosphatase 76 38 - 126 U/L   Total Bilirubin 0.3 0.0 - 1.2 mg/dL   GFR, Estimated >34 >74 mL/min    Comment: (NOTE) Calculated using the CKD-EPI Creatinine Equation (2021)    Anion gap 5 5 - 15    Comment: Performed at [facility], [address], [place], [place] [address]  Glucose, capillary     Status: None   Collection Time: 09/17/23  6:12 AM  Result Value Ref Range   Glucose-Capillary 78 70 - 99 mg/dL    Comment: Glucose  reference range applies only to samples taken after fasting for at least 8 hours.    DG HIP UNILAT WITH PELVIS 2-3 VIEWS LEFT Result  Date: 09/16/2023 CLINICAL DATA:  Fall, pain EXAM: DG HIP (WITH OR WITHOUT PELVIS) 2-3V LEFT COMPARISON:  None Available. FINDINGS: Diffuse osteopenia. Prior left hip replacement. No hardware complicating feature. No acute bony abnormality. Specifically, no fracture, subluxation, or dislocation. IMPRESSION: Osteopenia.  No acute bony abnormality. Electronically Signed   By: Janeece Mechanic M.D.   On: 09/16/2023 21:37   DG Chest 1 View Result Date: 09/16/2023 CLINICAL DATA:  Fall, pain EXAM: CHEST  1 VIEW COMPARISON:  05/17/2023 FINDINGS: Heart and mediastinal contours are within normal limits. No focal opacities or effusions. No acute bony abnormality. Calcifications noted in the breasts bilaterally. IMPRESSION: No active disease. Electronically Signed   By: Janeece Mechanic M.D.   On: 09/16/2023 21:37   DG Knee 1-2 Views Left Result Date: 09/16/2023 CLINICAL DATA:  Pain EXAM: LEFT KNEE - 1-2 VIEW COMPARISON:  None Available. FINDINGS: The bones are diffusely osteopenic. There is an acute oblique fracture through the distal femoral diaphysis without significant displacement or angulation. Joint spaces are maintained and alignment is anatomic. Soft tissues are within normal limits. IMPRESSION: Acute oblique fracture through the distal femoral diaphysis. Electronically Signed   By: Tyron Gallon M.D.   On: 09/16/2023 21:36   DG HIP UNILAT WITH PELVIS 2-3 VIEWS RIGHT Result Date: 09/16/2023 CLINICAL DATA:  Fall, pain EXAM: DG HIP (WITH OR WITHOUT PELVIS) 2-3V RIGHT COMPARISON:  None Available. FINDINGS: There is no evidence of hip fracture or dislocation. There is no evidence of arthropathy or other focal bone abnormality. IMPRESSION: Negative. Electronically Signed   By: Janeece Mechanic M.D.   On: 09/16/2023 21:35   DG Foot 2 Views Left Result Date: 09/16/2023 CLINICAL DATA:  Fall,  pain EXAM: LEFT FOOT - 2 VIEW COMPARISON:  None Available. FINDINGS: Diffuse osteopenia. No acute bony abnormality. Specifically, no fracture, subluxation, or dislocation. IMPRESSION: Osteopenia.  No acute bony abnormality. Electronically Signed   By: Janeece Mechanic M.D.   On: 09/16/2023 21:35   DG Foot 2 Views Right Result Date: 09/16/2023 CLINICAL DATA:  Fall, pain EXAM: RIGHT FOOT - 2 VIEW COMPARISON:  None Available. FINDINGS: Osteopenia. No acute bony abnormality. Specifically, no fracture, subluxation, or dislocation. IMPRESSION: Osteopenia.  No acute bony abnormality. Electronically Signed   By: Janeece Mechanic M.D.   On: 09/16/2023 21:34   DG Ankle 2 Views Right Result Date: 09/16/2023 CLINICAL DATA:  Fall, pain EXAM: RIGHT ANKLE - 2 VIEW COMPARISON:  None Available. FINDINGS: Diffuse osteopenia. No acute bony abnormality. Specifically, no fracture, subluxation, or dislocation. IMPRESSION: Osteopenia.  No acute bony abnormality. Electronically Signed   By: Janeece Mechanic M.D.   On: 09/16/2023 21:34   DG Ankle 2 Views Left Result Date: 09/16/2023 CLINICAL DATA:  Fall, pain EXAM: LEFT ANKLE - 2 VIEW COMPARISON:  None Available. FINDINGS: Diffuse osteopenia. No acute bony abnormality. Specifically, no fracture, subluxation, or dislocation. IMPRESSION: No acute bony abnormality.  Osteopenia Electronically Signed   By: Janeece Mechanic M.D.   On: 09/16/2023 21:33   DG Knee 1-2 Views Right Result Date: 09/16/2023 CLINICAL DATA:  Fall, pain EXAM: RIGHT KNEE - 1-2 VIEW COMPARISON:  None Available. FINDINGS: Diffuse osteopenia. No acute bony abnormality. Specifically, no fracture, subluxation, or dislocation. No joint effusion. IMPRESSION: Osteopenia.  No acute bony abnormality. Electronically Signed   By: Janeece Mechanic M.D.   On: 09/16/2023 21:33   CT Head Wo Contrast Result Date: 09/16/2023 CLINICAL DATA:  Head and neck trauma EXAM: CT HEAD WITHOUT CONTRAST CT CERVICAL SPINE WITHOUT  CONTRAST TECHNIQUE:  Multidetector CT imaging of the head and cervical spine was performed following the standard protocol without intravenous contrast. Multiplanar CT image reconstructions of the cervical spine were also generated. RADIATION DOSE REDUCTION: This exam was performed according to the departmental dose-optimization program which includes automated exposure control, adjustment of the mA and/or kV according to patient size and/or use of iterative reconstruction technique. COMPARISON:  CT brain 09/01/2023, 05/28/2023, CT cervical spine 04/19/2018 FINDINGS: CT HEAD FINDINGS Brain: No acute territorial infarction, hemorrhage or intracranial mass. Moderate atrophy.moderate chronic small vessel ischemic changes of the white matter. Multiple chronic appearing lacunar infarcts within the bilateral basal ganglia and thalami. Stable ventricle size. Vascular: No hyperdense vessels.  Carotid vascular calcification Skull: No fracture Sinuses/Orbits: Mucosal thickening in the sinuses Other: None CT CERVICAL SPINE FINDINGS Alignment: No subluxation.  Facet alignment is within normal limits. Skull base and vertebrae: No acute fracture. No primary bone lesion or focal pathologic process. Soft tissues and spinal canal: No prevertebral fluid or swelling. No visible canal hematoma. Disc levels:  Mild degenerative changes C4-C5, C5-C6 and C6-C7 Upper chest: Negative. Other: None IMPRESSION: 1. Limited by habitus and positioning. No definite CT evidence for acute intracranial abnormality. Atrophy and chronic small vessel ischemic changes of the white matter. Multiple chronic appearing lacunar infarcts within the bilateral basal ganglia and thalami. 2. Degenerative changes of the cervical spine. No acute osseous abnormality. Electronically Signed   By: Esmeralda Hedge M.D.   On: 09/16/2023 19:42   CT Cervical Spine Wo Contrast Result Date: 09/16/2023 CLINICAL DATA:  Head and neck trauma EXAM: CT HEAD WITHOUT CONTRAST CT CERVICAL SPINE WITHOUT  CONTRAST TECHNIQUE: Multidetector CT imaging of the head and cervical spine was performed following the standard protocol without intravenous contrast. Multiplanar CT image reconstructions of the cervical spine were also generated. RADIATION DOSE REDUCTION: This exam was performed according to the departmental dose-optimization program which includes automated exposure control, adjustment of the mA and/or kV according to patient size and/or use of iterative reconstruction technique. COMPARISON:  CT brain 09/01/2023, 05/28/2023, CT cervical spine 04/19/2018 FINDINGS: CT HEAD FINDINGS Brain: No acute territorial infarction, hemorrhage or intracranial mass. Moderate atrophy.moderate chronic small vessel ischemic changes of the white matter. Multiple chronic appearing lacunar infarcts within the bilateral basal ganglia and thalami. Stable ventricle size. Vascular: No hyperdense vessels.  Carotid vascular calcification Skull: No fracture Sinuses/Orbits: Mucosal thickening in the sinuses Other: None CT CERVICAL SPINE FINDINGS Alignment: No subluxation.  Facet alignment is within normal limits. Skull base and vertebrae: No acute fracture. No primary bone lesion or focal pathologic process. Soft tissues and spinal canal: No prevertebral fluid or swelling. No visible canal hematoma. Disc levels:  Mild degenerative changes C4-C5, C5-C6 and C6-C7 Upper chest: Negative. Other: None IMPRESSION: 1. Limited by habitus and positioning. No definite CT evidence for acute intracranial abnormality. Atrophy and chronic small vessel ischemic changes of the white matter. Multiple chronic appearing lacunar infarcts within the bilateral basal ganglia and thalami. 2. Degenerative changes of the cervical spine. No acute osseous abnormality. Electronically Signed   By: Esmeralda Hedge M.D.   On: 09/16/2023 19:42    Review of Systems  HENT:  Negative for ear discharge, ear pain, hearing loss and tinnitus.   Eyes:  Negative for photophobia  and pain.  Respiratory:  Negative for cough and shortness of breath.   Cardiovascular:  Negative for chest pain.  Gastrointestinal:  Negative for abdominal pain, nausea and vomiting.  Genitourinary:  Negative for dysuria, flank  pain, frequency and urgency.  Musculoskeletal:  Positive for arthralgias (Left knee, bilateral toes). Negative for back pain, myalgias and neck pain.  Neurological:  Negative for dizziness and headaches.  Hematological:  Does not bruise/bleed easily.  Psychiatric/Behavioral:  The patient is not nervous/anxious.    Blood pressure 114/87, pulse 86, temperature 98.8 F (37.1 C), temperature source Axillary, resp. rate 16, SpO2 98%. Physical Exam Constitutional:      General: She is not in acute distress.    Appearance: She is well-developed. She is not diaphoretic.  HENT:     Head: Normocephalic and atraumatic.  Eyes:     General: No scleral icterus.       Right eye: No discharge.        Left eye: No discharge.     Conjunctiva/sclera: Conjunctivae normal.  Neck:     Comments: C-collar. Post midline TTP. Cardiovascular:     Rate and Rhythm: Normal rate and regular rhythm.  Pulmonary:     Effort: Pulmonary effort is normal. No respiratory distress.  Musculoskeletal:     Cervical back: Tenderness present.     Comments: LLE No traumatic wounds, ecchymosis, or rash  Mod TTP knee  No knee or ankle effusion  Sens DPN, SPN, TN intact  Motor EHL, ext, flex, evers 5/5  DP 1+, PT 0, No significant edema  Skin:    General: Skin is warm and dry.  Neurological:     Mental Status: She is alert.  Psychiatric:        Mood and Affect: Mood normal.        Behavior: Behavior normal.     Assessment/Plan: Left distal femur fx -- Plan ORIF today with Dr. Guyann Leitz. Please keep NPO. Neck pain -- Will get flex/ex views    Georganna Kin, PA-C Orthopedic Surgery [phone] 09/17/2023, 9:08 AM

## 2023-09-18 ENCOUNTER — Encounter (HOSPITAL_COMMUNITY)
Admission: EM | Disposition: A | Discharge status: Skilled Nursing Facility | Payer: Self-pay | Source: Skilled Nursing Facility | Attending: Internal Medicine

## 2023-09-18 ENCOUNTER — Inpatient Hospital Stay (HOSPITAL_COMMUNITY): Admitting: Anesthesiology

## 2023-09-18 ENCOUNTER — Inpatient Hospital Stay (HOSPITAL_COMMUNITY)

## 2023-09-18 ENCOUNTER — Encounter (HOSPITAL_COMMUNITY): Payer: Self-pay | Admitting: Internal Medicine

## 2023-09-18 ENCOUNTER — Other Ambulatory Visit: Payer: Self-pay

## 2023-09-18 DIAGNOSIS — S72402A Unspecified fracture of lower end of left femur, initial encounter for closed fracture: Secondary | ICD-10-CM

## 2023-09-18 DIAGNOSIS — I10 Essential (primary) hypertension: Secondary | ICD-10-CM | POA: Diagnosis not present

## 2023-09-18 DIAGNOSIS — F418 Other specified anxiety disorders: Secondary | ICD-10-CM | POA: Diagnosis not present

## 2023-09-18 DIAGNOSIS — Z87891 Personal history of nicotine dependence: Secondary | ICD-10-CM

## 2023-09-18 DIAGNOSIS — S7290XA Unspecified fracture of unspecified femur, initial encounter for closed fracture: Secondary | ICD-10-CM | POA: Diagnosis not present

## 2023-09-18 HISTORY — PX: ORIF FEMUR FRACTURE: SHX2119

## 2023-09-18 LAB — CBC
HCT: 40.9 % (ref 36.0–46.0)
Hemoglobin: 13.5 g/dL (ref 12.0–15.0)
MCH: 36.2 pg — ABNORMAL HIGH (ref 26.0–34.0)
MCHC: 33 g/dL (ref 30.0–36.0)
MCV: 109.7 fL — ABNORMAL HIGH (ref 80.0–100.0)
Platelets: 216 10*3/uL (ref 150–400)
RBC: 3.73 MIL/uL — ABNORMAL LOW (ref 3.87–5.11)
RDW: 13.5 % (ref 11.5–15.5)
WBC: 9 10*3/uL (ref 4.0–10.5)
nRBC: 0 % (ref 0.0–0.2)

## 2023-09-18 LAB — BASIC METABOLIC PANEL WITH GFR
Anion gap: 7 (ref 5–15)
BUN: 10 mg/dL (ref 8–23)
CO2: 23 mmol/L (ref 22–32)
Calcium: 7.6 mg/dL — ABNORMAL LOW (ref 8.9–10.3)
Chloride: 110 mmol/L (ref 98–111)
Creatinine, Ser: 0.35 mg/dL — ABNORMAL LOW (ref 0.44–1.00)
GFR, Estimated: >60 mL/min (ref >60)
Glucose, Bld: 132 mg/dL — ABNORMAL HIGH (ref 70–99)
Potassium: 4.7 mmol/L (ref 3.5–5.1)
Sodium: 140 mmol/L (ref 135–145)

## 2023-09-18 LAB — GLUCOSE, CAPILLARY
Glucose-Capillary: 112 mg/dL — ABNORMAL HIGH (ref 70–99)
Glucose-Capillary: 119 mg/dL — ABNORMAL HIGH (ref 70–99)
Glucose-Capillary: 120 mg/dL — ABNORMAL HIGH (ref 70–99)
Glucose-Capillary: 123 mg/dL — ABNORMAL HIGH (ref 70–99)

## 2023-09-18 LAB — MAGNESIUM: Magnesium: 1.5 mg/dL — ABNORMAL LOW (ref 1.7–2.4)

## 2023-09-18 LAB — VITAMIN D 25 HYDROXY (VIT D DEFICIENCY, FRACTURES): Vit D, 25-Hydroxy: 98.46 ng/mL (ref 30–100)

## 2023-09-18 SURGERY — OPEN REDUCTION INTERNAL FIXATION (ORIF) DISTAL FEMUR FRACTURE
Anesthesia: General | Laterality: Left

## 2023-09-18 MED ORDER — ARIPIPRAZOLE 5 MG PO TABS
5.0000 mg | ORAL_TABLET | Freq: Every day | ORAL | Status: DC
  Administered 2023-09-19 – 2023-09-20 (×2): 5 mg via ORAL
  Filled 2023-09-18 (×2): qty 1

## 2023-09-18 MED ORDER — ACETAMINOPHEN 500 MG PO TABS
1000.0000 mg | ORAL_TABLET | Freq: Four times a day (QID) | ORAL | Status: DC
  Administered 2023-09-19 – 2023-09-20 (×4): 1000 mg via ORAL
  Filled 2023-09-18 (×5): qty 2

## 2023-09-18 MED ORDER — VANCOMYCIN HCL 1000 MG IV SOLR
INTRAVENOUS | Status: DC | PRN
  Administered 2023-09-18: 1000 mg

## 2023-09-18 MED ORDER — PROPOFOL 10 MG/ML IV BOLUS
INTRAVENOUS | Status: DC | PRN
  Administered 2023-09-18: 100 mg via INTRAVENOUS

## 2023-09-18 MED ORDER — PHENYLEPHRINE HCL-NACL 20-0.9 MG/250ML-% IV SOLN
INTRAVENOUS | Status: DC | PRN
  Administered 2023-09-18: 35 ug/min via INTRAVENOUS

## 2023-09-18 MED ORDER — POVIDONE-IODINE 10 % EX SWAB: 2.0000 | Freq: Once | CUTANEOUS | Status: DC

## 2023-09-18 MED ORDER — METHOCARBAMOL 500 MG PO TABS: 500.0000 mg | ORAL_TABLET | Freq: Four times a day (QID) | ORAL | Status: DC | PRN

## 2023-09-18 MED ORDER — FENTANYL CITRATE (PF) 100 MCG/2ML IJ SOLN: 25.0000 ug | INTRAMUSCULAR | Status: DC | PRN

## 2023-09-18 MED ORDER — FENTANYL CITRATE (PF) 250 MCG/5ML IJ SOLN
INTRAMUSCULAR | Status: AC
  Filled 2023-09-18: qty 5

## 2023-09-18 MED ORDER — VANCOMYCIN HCL 1000 MG IV SOLR
INTRAVENOUS | Status: AC
  Filled 2023-09-18: qty 20

## 2023-09-18 MED ORDER — DOCUSATE SODIUM 100 MG PO CAPS
100.0000 mg | ORAL_CAPSULE | Freq: Two times a day (BID) | ORAL | Status: DC
  Administered 2023-09-19 – 2023-09-20 (×2): 100 mg via ORAL
  Filled 2023-09-18 (×3): qty 1

## 2023-09-18 MED ORDER — VITAMIN D (ERGOCALCIFEROL) 1.25 MG (50000 UNIT) PO CAPS
50000.0000 [IU] | ORAL_CAPSULE | ORAL | Status: DC
  Administered 2023-09-19: 50000 [IU] via ORAL
  Filled 2023-09-18: qty 1

## 2023-09-18 MED ORDER — PHENYLEPHRINE 80 MCG/ML (10ML) SYRINGE FOR IV PUSH (FOR BLOOD PRESSURE SUPPORT)
PREFILLED_SYRINGE | INTRAVENOUS | Status: DC | PRN
Start: 2023-09-18 — End: 2023-09-18
  Administered 2023-09-18 (×3): 80 ug via INTRAVENOUS

## 2023-09-18 MED ORDER — CHLORHEXIDINE GLUCONATE 0.12 % MT SOLN: 15.0000 mL | Freq: Once | OROMUCOSAL | Status: DC

## 2023-09-18 MED ORDER — CHLORHEXIDINE GLUCONATE 0.12 % MT SOLN
OROMUCOSAL | Status: AC
  Filled 2023-09-18: qty 15

## 2023-09-18 MED ORDER — MIDAZOLAM HCL 2 MG/2ML IJ SOLN
INTRAMUSCULAR | Status: DC | PRN
  Administered 2023-09-18: 2 mg via INTRAVENOUS

## 2023-09-18 MED ORDER — CEFAZOLIN SODIUM-DEXTROSE 2-4 GM/100ML-% IV SOLN
2.0000 g | INTRAVENOUS | Status: AC
  Administered 2023-09-18: 2 g via INTRAVENOUS
  Filled 2023-09-18: qty 100

## 2023-09-18 MED ORDER — FENTANYL CITRATE (PF) 250 MCG/5ML IJ SOLN
INTRAMUSCULAR | Status: DC | PRN
  Administered 2023-09-18 (×2): 50 ug via INTRAVENOUS

## 2023-09-18 MED ORDER — LACTATED RINGERS IV SOLN: INTRAVENOUS | Status: DC | PRN

## 2023-09-18 MED ORDER — DEXAMETHASONE SODIUM PHOSPHATE 10 MG/ML IJ SOLN
INTRAMUSCULAR | Status: DC | PRN
  Administered 2023-09-18: 10 mg via INTRAVENOUS

## 2023-09-18 MED ORDER — MIDAZOLAM HCL 2 MG/2ML IJ SOLN
INTRAMUSCULAR | Status: AC
  Filled 2023-09-18: qty 2

## 2023-09-18 MED ORDER — PROPOFOL 10 MG/ML IV BOLUS
INTRAVENOUS | Status: AC
  Filled 2023-09-18: qty 20

## 2023-09-18 MED ORDER — ACETAMINOPHEN 10 MG/ML IV SOLN: 1000.0000 mg | Freq: Once | INTRAVENOUS | Status: DC | PRN

## 2023-09-18 MED ORDER — POLYETHYLENE GLYCOL 3350 17 G PO PACK: 17.0000 g | PACK | Freq: Every day | ORAL | Status: DC | PRN

## 2023-09-18 MED ORDER — ASPIRIN 325 MG PO TABS
325.0000 mg | ORAL_TABLET | Freq: Every day | ORAL | Status: DC
  Administered 2023-09-19 – 2023-09-20 (×2): 325 mg via ORAL
  Filled 2023-09-18 (×2): qty 1

## 2023-09-18 MED ORDER — 0.9 % SODIUM CHLORIDE (POUR BTL) OPTIME
TOPICAL | Status: DC | PRN
  Administered 2023-09-18: 1000 mL

## 2023-09-18 MED ORDER — LACTATED RINGERS IV SOLN: INTRAVENOUS | Status: DC

## 2023-09-18 MED ORDER — ORAL CARE MOUTH RINSE: 15.0000 mL | Freq: Once | OROMUCOSAL | Status: DC

## 2023-09-18 MED ORDER — MAGNESIUM SULFATE 2 GM/50ML IV SOLN
2.0000 g | Freq: Once | INTRAVENOUS | Status: AC
  Administered 2023-09-18: 2 g via INTRAVENOUS
  Filled 2023-09-18: qty 50

## 2023-09-18 MED ORDER — CHLORHEXIDINE GLUCONATE 4 % EX SOLN: 60.0000 mL | Freq: Once | CUTANEOUS | Status: DC

## 2023-09-18 MED ORDER — ONDANSETRON HCL 4 MG/2ML IJ SOLN
INTRAMUSCULAR | Status: DC | PRN
  Administered 2023-09-18: 4 mg via INTRAVENOUS

## 2023-09-18 MED ORDER — CEFAZOLIN SODIUM-DEXTROSE 2-4 GM/100ML-% IV SOLN
2.0000 g | Freq: Three times a day (TID) | INTRAVENOUS | Status: AC
  Administered 2023-09-18 – 2023-09-19 (×3): 2 g via INTRAVENOUS
  Filled 2023-09-18 (×3): qty 100

## 2023-09-18 MED ORDER — ACETAMINOPHEN 650 MG RE SUPP
650.0000 mg | Freq: Four times a day (QID) | RECTAL | Status: DC
  Filled 2023-09-18: qty 1

## 2023-09-18 MED ORDER — SUGAMMADEX SODIUM 200 MG/2ML IV SOLN
INTRAVENOUS | Status: DC | PRN
  Administered 2023-09-18: 200 mg via INTRAVENOUS

## 2023-09-18 MED ORDER — MORPHINE SULFATE (PF) 2 MG/ML IV SOLN
0.5000 mg | INTRAVENOUS | Status: DC | PRN
  Administered 2023-09-19: 0.5 mg via INTRAVENOUS
  Filled 2023-09-18: qty 1

## 2023-09-18 MED ORDER — CALCIUM CARBONATE ANTACID 500 MG PO CHEW
1.0000 | CHEWABLE_TABLET | Freq: Two times a day (BID) | ORAL | Status: DC
  Administered 2023-09-20: 200 mg via ORAL
  Filled 2023-09-18 (×3): qty 1

## 2023-09-18 MED ORDER — CELECOXIB 50 MG PO CAPS
50.0000 mg | ORAL_CAPSULE | Freq: Every day | ORAL | Status: DC
Start: 2023-09-18 — End: 2023-09-18

## 2023-09-18 MED ORDER — PANTOPRAZOLE SODIUM 40 MG PO TBEC
40.0000 mg | DELAYED_RELEASE_TABLET | Freq: Every day | ORAL | Status: DC
  Administered 2023-09-19 – 2023-09-20 (×2): 40 mg via ORAL
  Filled 2023-09-18 (×2): qty 1

## 2023-09-18 MED ORDER — LIDOCAINE 2% (20 MG/ML) 5 ML SYRINGE
INTRAMUSCULAR | Status: DC | PRN
  Administered 2023-09-18: 60 mg via INTRAVENOUS

## 2023-09-18 MED ORDER — ROCURONIUM BROMIDE 10 MG/ML (PF) SYRINGE
PREFILLED_SYRINGE | INTRAVENOUS | Status: DC | PRN
  Administered 2023-09-18: 50 mg via INTRAVENOUS

## 2023-09-18 MED ORDER — OXYCODONE HCL 5 MG PO TABS
2.5000 mg | ORAL_TABLET | Freq: Four times a day (QID) | ORAL | Status: DC | PRN
  Administered 2023-09-19: 5 mg via ORAL
  Filled 2023-09-18: qty 1

## 2023-09-18 MED ORDER — METHOCARBAMOL 1000 MG/10ML IJ SOLN: 500.0000 mg | Freq: Four times a day (QID) | INTRAMUSCULAR | Status: DC | PRN

## 2023-09-18 SURGICAL SUPPLY — 60 items
BAG COUNTER SPONGE SURGICOUNT (BAG) ×1 IMPLANT
BIT DRILL 4.3X300MM (BIT) IMPLANT
BIT DRILL LONG 3.3 (BIT) IMPLANT
BIT DRILL QC 3.3X195 (BIT) IMPLANT
BLADE CLIPPER SURG (BLADE) IMPLANT
BNDG COHESIVE 6X5 TAN ST LF (GAUZE/BANDAGES/DRESSINGS) ×1 IMPLANT
BNDG ELASTIC 4INX 5YD STR LF (GAUZE/BANDAGES/DRESSINGS) IMPLANT
BNDG ELASTIC 6INX 5YD STR LF (GAUZE/BANDAGES/DRESSINGS) IMPLANT
BNDG ELASTIC 6X10 VLCR STRL LF (GAUZE/BANDAGES/DRESSINGS) ×1 IMPLANT
BRUSH SCRUB EZ PLAIN DRY (MISCELLANEOUS) ×2 IMPLANT
CANISTER SUCTION 3000ML PPV (SUCTIONS) ×1 IMPLANT
CAP LOCK NCB (Cap) IMPLANT
CHLORAPREP W/TINT 26 (MISCELLANEOUS) ×1 IMPLANT
COVER SURGICAL LIGHT HANDLE (MISCELLANEOUS) ×1 IMPLANT
DRAPE C-ARM 42X72 X-RAY (DRAPES) ×1 IMPLANT
DRAPE C-ARMOR (DRAPES) ×1 IMPLANT
DRAPE HALF SHEET 40X57 (DRAPES) ×2 IMPLANT
DRAPE SURG 17X23 STRL (DRAPES) ×1 IMPLANT
DRAPE SURG ORHT 6 SPLT 77X108 (DRAPES) ×2 IMPLANT
DRAPE U-SHAPE 47X51 STRL (DRAPES) ×1 IMPLANT
DRESSING MEPILEX FLEX 4X4 (GAUZE/BANDAGES/DRESSINGS) IMPLANT
DRSG ADAPTIC 3X8 NADH LF (GAUZE/BANDAGES/DRESSINGS) IMPLANT
DRSG MEPILEX POST OP 4X12 (GAUZE/BANDAGES/DRESSINGS) IMPLANT
DRSG MEPILEX POST OP 4X8 (GAUZE/BANDAGES/DRESSINGS) IMPLANT
ELECTRODE REM PT RTRN 9FT ADLT (ELECTROSURGICAL) ×1 IMPLANT
GAUZE PAD ABD 8X10 STRL (GAUZE/BANDAGES/DRESSINGS) ×3 IMPLANT
GAUZE SPONGE 4X4 12PLY STRL (GAUZE/BANDAGES/DRESSINGS) ×1 IMPLANT
GLOVE BIO SURGEON STRL SZ 6.5 (GLOVE) ×3 IMPLANT
GLOVE BIO SURGEON STRL SZ7.5 (GLOVE) ×4 IMPLANT
GLOVE BIOGEL PI IND STRL 6.5 (GLOVE) ×1 IMPLANT
GLOVE BIOGEL PI IND STRL 7.5 (GLOVE) ×1 IMPLANT
GOWN STRL REUS W/ TWL LRG LVL3 (GOWN DISPOSABLE) ×3 IMPLANT
KIT BASIN OR (CUSTOM PROCEDURE TRAY) ×1 IMPLANT
KIT TURNOVER KIT B (KITS) ×1 IMPLANT
KWIRE FXSTD 280X2XNS SS (WIRE) IMPLANT
NS IRRIG 1000ML POUR BTL (IV SOLUTION) ×1 IMPLANT
PACK TOTAL JOINT (CUSTOM PROCEDURE TRAY) ×1 IMPLANT
PAD ARMBOARD POSITIONER FOAM (MISCELLANEOUS) ×1 IMPLANT
PAD CAST 4YDX4 CTTN HI CHSV (CAST SUPPLIES) ×1 IMPLANT
PAD CAST CTTN 4X4 STRL (SOFTGOODS) IMPLANT
PADDING CAST COTTON 6X4 STRL (CAST SUPPLIES) ×1 IMPLANT
PLATE NCB 15H HIP (Plate) IMPLANT
SCREW 5.0 70MM (Screw) IMPLANT
SCREW NCB 3.5X75X5X6.2XST (Screw) IMPLANT
SCREW NCB 4.0MX34M (Screw) IMPLANT
SCREW NCB 5.0X34MM (Screw) IMPLANT
SCREW UNICORTICAL 5.0X14 (Screw) IMPLANT
SPONGE T-LAP 18X18 ~~LOC~~+RFID (SPONGE) IMPLANT
STAPLER VISISTAT 35W (STAPLE) ×1 IMPLANT
SUCTION TUBE FRAZIER 10FR DISP (SUCTIONS) ×1 IMPLANT
SUT ETHILON 3 0 PS 1 (SUTURE) ×2 IMPLANT
SUT MNCRL AB 3-0 PS2 27 (SUTURE) IMPLANT
SUT MON AB 2-0 CT1 36 (SUTURE) IMPLANT
SUT VIC AB 0 CT1 27XBRD ANBCTR (SUTURE) IMPLANT
SUT VIC AB 0 CT1 36 (SUTURE) IMPLANT
SUT VIC AB 1 CT1 27XBRD ANBCTR (SUTURE) IMPLANT
SUT VIC AB 2-0 CT1 TAPERPNT 27 (SUTURE) ×2 IMPLANT
TOWEL GREEN STERILE (TOWEL DISPOSABLE) ×2 IMPLANT
TRAY FOLEY MTR SLVR 16FR STAT (SET/KITS/TRAYS/PACK) IMPLANT
WATER STERILE IRR 1000ML POUR (IV SOLUTION) ×2 IMPLANT

## 2023-09-18 NOTE — Progress Notes (Signed)
 PROGRESS NOTE    Rose Bridges  AOZ:[id number] DOB: Sep 07, 1960 DOA: 09/16/2023 PCP: Diona Franklin    Brief Narrative:   Rose Bridges is a 63 y.o. female with past medical history significant for seizure disorder, osteogenesis imperfecta, bipolar disorder, chronic pain syndrome who presented to [facility] ED from [facility] via EMS after fall at facility with pain to her left lower extremity.  Workup in the ED notable for left distal femur fracture on imaging.  Orthopedics was consulted.  TRH consulted for admission for further evaluation management of left distal femur fracture.  Assessment & Plan:   Left distal femur fracture Patient presenting from long-term care facility after fall with left lower extremity pain.  Most recent PT notes from 5/6, baseline nonambulatory and LTC SNF resident; although has been noncompliant which likely led to fall and sustaining injury.  Left knee x-ray with acute oblique fracture through the distal femoral diaphysis. -- Orthopedics following, appreciate assistance -- Check vitamin D  25-hydroxy level -- Oxycodone  5 mg PO q4h PRN moderate pain -- Morphine  0.5 mg IV q2h PRN severe pain -- NS at 100 mL/h -- N.p.o. for planned surgical invention by orthopedics today  Sinus tachycardia TSH within normal limits.  Recently admitted with some SVT noted on telemetry. -- Started on metoprolol  tartrate 12.5 g p.o. twice daily -- Continue monitor on telemetry  Hypokalemia Hypomagnesemia Potassium repleted, repeat 4.7 this morning.  Magnesium  1.5.  Will replete magnesium . -- Repeat electrolytes in a.m.  Seizure disorder -- Keppra  500 mg p.o. twice daily -- Depakote 250 g p.o. nightly  Bipolar disorder -- Seroquel  12.5 mg p.o. twice daily -- Abilify 5 p.o. p.o. daily  Chronic pain -- Gabapentin  300 mg p.o. 3 times daily -- Cymbalta  30 mg p.o. 3 times daily  Anxiety -- Ativan  0.5 Thomson p.o. every 6 hours as needed  anxiety  Cognitive impairment Patient with scheduled follow-up with neurologist in a few months. --Delirium precautions --Get up during the day --Encourage a familiar face to remain present throughout the day --Keep blinds open and lights on during daylight hours --Minimize the use of opioids/benzodiazepines  Osteogenesis imperfecta Continue calcitonin  Protein calorie malnutrition, moderate Nutrition Status: Nutrition Problem: Moderate Malnutrition Etiology: chronic illness (cognitive impairment) Signs/Symptoms: mild fat depletion, moderate muscle depletion, severe muscle depletion Interventions: Ensure Enlive (each supplement provides 350kcal and 20 grams of protein), Magic cup, MVI -- Continue supplementation, increase oral intake  Goals of care: Conversation with previous hospitalist, Dr. Lydia Sams with daughter who requested placing patient on DNR status based on conversation with palliative care during last admission.    DVT prophylaxis: SCDs Start: 09/17/23 0340    Code Status: Limited: Do not attempt resuscitation (DNR) -DNR-LIMITED -Do Not Intubate/DNI  Family Communication: No family present at bedside this morning  Disposition Plan:  Level of care: Telemetry Medical Status is: Inpatient Remains inpatient appropriate because: Awaiting operative management for femur fracture    Consultants:  Orthopedics  Procedures:  TTE  Antimicrobials:  None   Subjective: Patient seen examined bedside, lying in bed.  RN present at bedside.  Pain controlled.  No questions, concerns or complaints at this time.  Denies headache, no chest pain, no shortness of breath, abdominal pain, no fever.  No acute events overnight per nursing staff.  Objective: Vitals:   09/17/23 2100 09/17/23 2203 09/18/23 0417 09/18/23 0824  BP: 93/74 94/71 (!) 101/57 119/76  Pulse:   (!) 103 (!) 101  Resp:   17 18  Temp:  98.6 F (37 C) 98.4 F (36.9 C)  TempSrc:   Axillary Oral  SpO2: 94% 100%  96% 95%    Intake/Output Summary (Last 24 hours) at 09/18/2023 1231 Last data filed at 09/18/2023 0900 Gross per 24 hour  Intake 744.68 ml  Output --  Net 744.68 ml   There were no vitals filed for this visit.  Examination:  Physical Exam: GEN: NAD, alert and oriented x 3, elderly appearance HEENT: NCAT, PERRL, EOMI, sclera clear, MMM PULM: CTAB w/o wheezes/crackles, normal respiratory effort CV: Tachycardic, regular rhythm w/o M/G/R GI: abd soft, NTND, + BS MSK: no peripheral edema, moves all extremities independently, left knee immobilizer noted in place, neurovascularly intact NEURO: No focal neurological deficits, sensation to light touch intact PSYCH: normal mood/affect Integumentary: No concerning rashes/lesions/wounds none exposed skin surfaces    Data Reviewed: I have personally reviewed following labs and imaging studies  CBC: Recent Labs  Lab 09/16/23 1815 09/17/23 1311 09/18/23 0653  WBC 14.2* 9.0 9.0  NEUTROABS 10.5*  --   --   HGB 15.5* 16.4* 13.5  HCT 48.3* 49.5* 40.9  MCV 111.5* 109.3* 109.7*  PLT 239 243 216   Basic Metabolic Panel: Recent Labs  Lab 09/16/23 2052 09/17/23 1311 09/18/23 0653  NA 141 142 140  K 3.4* 4.1 4.7  CL 109 106 110  CO2 27 24 23   GLUCOSE 126* 91 132*  BUN 13 13 10   CREATININE 0.48 0.44 0.35*  CALCIUM  7.9* 8.4* 7.6*  MG  --   --  1.5*   GFR: Estimated Creatinine Clearance: 61.3 mL/min (A) (by C-G formula based on SCr of 0.35 mg/dL (L)). Liver Function Tests: Recent Labs  Lab 09/16/23 2052 09/17/23 1311  AST 30 34  ALT 35 38  ALKPHOS 76 89  BILITOT 0.3 0.8  PROT 4.4* 5.4*  ALBUMIN 1.8* 2.3*   No results for input(s): "LIPASE", "AMYLASE" in the last 168 hours. No results for input(s): "AMMONIA" in the last 168 hours. Coagulation Profile: No results for input(s): "INR", "PROTIME" in the last 168 hours. Cardiac Enzymes: No results for input(s): "CKTOTAL", "CKMB", "CKMBINDEX", "TROPONINI" in the last 168  hours. BNP (last 3 results) No results for input(s): "PROBNP" in the last 8760 hours. HbA1C: No results for input(s): "HGBA1C" in the last 72 hours. CBG: Recent Labs  Lab 09/17/23 1209 09/17/23 1735 09/18/23 0002 09/18/23 0637 09/18/23 1157  GLUCAP 75 81 119* 123* 120*   Lipid Profile: No results for input(s): "CHOL", "HDL", "LDLCALC", "TRIG", "CHOLHDL", "LDLDIRECT" in the last 72 hours. Thyroid  Function Tests: Recent Labs    09/17/23 1322  TSH 0.878   Anemia Panel: Recent Labs    09/17/23 1322  VITAMINB12 1,708*  FOLATE 13.4   Sepsis Labs: No results for input(s): "PROCALCITON", "LATICACIDVEN" in the last 168 hours.  Recent Results (from the past 240 hours)  Surgical pcr screen     Status: None   Collection Time: 09/17/23  7:21 AM   Specimen: Nasal Mucosa; Nasal Swab  Result Value Ref Range Status   MRSA, PCR NEGATIVE NEGATIVE Final   Staphylococcus aureus NEGATIVE NEGATIVE Final    Comment: (NOTE) The Xpert SA Assay (FDA approved for NASAL specimens in patients 68 years of age and older), is one component of a comprehensive surveillance program. It is not intended to diagnose infection nor to guide or monitor treatment. Performed at [facility], [address] [place], [place] [address]          Radiology  Studies: ECHOCARDIOGRAM COMPLETE Result Date: 09/17/2023    ECHOCARDIOGRAM REPORT   Patient Name:   Shericka Johnstone Date of Exam: 09/17/2023 Medical Rec #:  [id number]                  Height:       64.0 in Accession #:    [id number]                 Weight:       125.7 lb Date of Birth:  1960-07-22                  BSA:          1.606 m Patient Age:    63 years                   BP:           114/87 mmHg Patient Gender: F                          HR:           112 bpm. Exam Location:  Inpatient Procedure: 2D Echo, Cardiac Doppler and Color Doppler (Both Spectral and Color            Flow Doppler were utilized during procedure). Indications:     Dyspnea R06.00  History:        Patient has prior history of Echocardiogram examinations, most                 recent 11/10/2016. Arrythmias:Tachycardia; Risk Factors:Current                 Smoker.  Sonographer:    Terrilee Few RCS Referring Phys: PATEL, PRANAV, M  Sonographer Comments: Image acquisition challenging due to patient behavioral factors. IMPRESSIONS  1. Left ventricular ejection fraction, by estimation, is 60 to 65%. The left ventricle has normal function. The left ventricle has no regional wall motion abnormalities. Left ventricular diastolic parameters are consistent with Grade I diastolic dysfunction (impaired relaxation).  2. Right ventricular systolic function is normal. The right ventricular size is normal.  3. The mitral valve is normal in structure. No evidence of mitral valve regurgitation. No evidence of mitral stenosis.  4. The aortic valve is normal in structure. Aortic valve regurgitation is trivial. No aortic stenosis is present. Comparison(s): Prior images reviewed side by side. Diastolic LV parameters are slightly worse, but left heart filling pressures remain normal. FINDINGS  Left Ventricle: Left ventricular ejection fraction, by estimation, is 60 to 65%. The left ventricle has normal function. The left ventricle has no regional wall motion abnormalities. The left ventricular internal cavity size was normal in size. There is  no left ventricular hypertrophy. Left ventricular diastolic parameters are consistent with Grade I diastolic dysfunction (impaired relaxation). Normal left ventricular filling pressure. Right Ventricle: The right ventricular size is normal. No increase in right ventricular wall thickness. Right ventricular systolic function is normal. Left Atrium: Left atrial size was normal in size. Right Atrium: Right atrial size was normal in size. Pericardium: Trivial pericardial effusion is present. Presence of epicardial fat layer. Mitral Valve: The mitral valve is  normal in structure. No evidence of mitral valve regurgitation. No evidence of mitral valve stenosis. Tricuspid Valve: The tricuspid valve is normal in structure. Tricuspid valve regurgitation is trivial. Aortic Valve: The aortic valve is normal in structure. Aortic valve regurgitation is trivial. No aortic stenosis is  present. Aortic valve peak gradient measures 5.4 mmHg. Pulmonic Valve: The pulmonic valve was not well visualized. Aorta: The aortic root and ascending aorta are structurally normal, with no evidence of dilitation. IAS/Shunts: The interatrial septum was not well visualized.  LEFT VENTRICLE PLAX 2D LVIDd:         3.30 cm   Diastology LVIDs:         2.50 cm   LV e' medial:    5.98 cm/s LV PW:         0.70 cm   LV E/e' medial:  8.0 LV IVS:        0.70 cm   LV e' lateral:   6.85 cm/s LVOT diam:     1.90 cm   LV E/e' lateral: 7.0 LV SV:         27 LV SV Index:   17 LVOT Area:     2.84 cm  RIGHT VENTRICLE             IVC RV S prime:     13.80 cm/s  IVC diam: 1.00 cm TAPSE (M-mode): 1.6 cm LEFT ATRIUM         Index LA diam:    1.80 cm 1.12 cm/m  AORTIC VALVE AV Area (Vmax): 1.72 cm AV Vmax:        116.00 cm/s AV Peak Grad:   5.4 mmHg LVOT Vmax:      70.20 cm/s LVOT Vmean:     46.100 cm/s LVOT VTI:       0.094 m  AORTA Ao Root diam: 2.60 cm Ao Asc diam:  2.70 cm MITRAL VALVE               TRICUSPID VALVE MV Area (PHT): 6.65 cm    TR Peak grad:   20.6 mmHg MV Decel Time: 114 msec    TR Vmax:        227.00 cm/s MV E velocity: 47.70 cm/s MV A velocity: 68.70 cm/s  SHUNTS MV E/A ratio:  0.69        Systemic VTI:  0.09 m                            Systemic Diam: 1.90 cm Karyl Paget Croitoru MD Electronically signed by Luana Rumple MD Signature Date/Time: 09/17/2023/5:47:21 PM    Final    DG Cerv Spine Flex&Ext Only Result Date: 09/17/2023 CLINICAL DATA:  63 year old female with neck pain EXAM: CERVICAL SPINE - FLEXION AND EXTENSION VIEWS ONLY COMPARISON:  None Available. FINDINGS: Cervical Spine: Flexion and  extension images of the cervical spine demonstrate vertebral bodies aligned with no pathologic motion. No subluxation, anterolisthesis, retrolisthesis. No acute fracture line identified. Vertebral body heights maintained. Early endplate changes spanning C4-C7 with anterior osteophyte formation. No significant facet disease. Prevertebral soft tissues within normal limits. IMPRESSION: Negative for acute fracture. Negative for pathologic motion Electronically Signed   By: Myrlene Asper D.O.   On: 09/17/2023 14:04   DG FEMUR MIN 2 VIEWS LEFT Result Date: 09/17/2023 CLINICAL DATA:  Fracture.  Distal femur fracture after fall. EXAM: LEFT FEMUR 2 VIEWS COMPARISON:  Knee radiographs yesterday FINDINGS: Oblique distal femur fracture with decreased displacement from yesterday's exam. No knee joint effusion to suggest intra-articular involvement. The bones are diffusely under mineralized. Hip arthroplasty is intact were visualized. Vascular calcifications are seen. IMPRESSION: Oblique distal femur fracture with decreased displacement from yesterday's exam. Electronically Signed   By: Alvina Axon.D.  On: 09/17/2023 12:53   CT KNEE LEFT WO CONTRAST Result Date: 09/17/2023 CLINICAL DATA:  Left femur fracture. EXAM: CT OF THE LEFT KNEE WITHOUT CONTRAST TECHNIQUE: Multidetector CT imaging of the left knee was performed according to the standard protocol. Multiplanar CT image reconstructions were also generated. RADIATION DOSE REDUCTION: This exam was performed according to the departmental dose-optimization program which includes automated exposure control, adjustment of the mA and/or kV according to patient size and/or use of iterative reconstruction technique. COMPARISON:  Knee radiographs 09/16/2023 FINDINGS: Bones/Joint/Cartilage Longitudinally extending fracture extending from the medial metadiaphysis distally. Distal margin of the fracture is indistinct but this probably extends into the medial portion of the  femoral trochlear groove and intercondylar notch. Accordingly this is likely an OTA 33-B2 fracture pattern. Small hemarthrosis. Bony demineralization. Medial and lateral compartmental articular space narrowing. Ligaments Suboptimally assessed by CT. Muscles and Tendons Regional muscular atrophy. Soft tissues Subcutaneous edema posteromedially along the distal thigh and both posteromedially and posterolaterally along the proximal calf. Atherosclerosis. IMPRESSION: 1. Longitudinally extending fracture extending from the medial metadiaphysis distally. Distal margin of the fracture is indistinct but this probably extends into the medial portion of the femoral trochlear groove and intercondylar notch. Accordingly this is likely an OTA 33-B2 fracture pattern. 2. Small hemarthrosis. 3. Bony demineralization. 4. Medial and lateral compartmental articular space narrowing. 5. Regional muscular atrophy. 6. Subcutaneous edema posteromedially along the distal thigh and both posteromedially and posterolaterally along the proximal calf. 7. Atherosclerosis. Electronically Signed   By: Freida Jes M.D.   On: 09/17/2023 12:08   DG HIP UNILAT WITH PELVIS 2-3 VIEWS LEFT Result Date: 09/16/2023 CLINICAL DATA:  Fall, pain EXAM: DG HIP (WITH OR WITHOUT PELVIS) 2-3V LEFT COMPARISON:  None Available. FINDINGS: Diffuse osteopenia. Prior left hip replacement. No hardware complicating feature. No acute bony abnormality. Specifically, no fracture, subluxation, or dislocation. IMPRESSION: Osteopenia.  No acute bony abnormality. Electronically Signed   By: Janeece Mechanic M.D.   On: 09/16/2023 21:37   DG Chest 1 View Result Date: 09/16/2023 CLINICAL DATA:  Fall, pain EXAM: CHEST  1 VIEW COMPARISON:  05/17/2023 FINDINGS: Heart and mediastinal contours are within normal limits. No focal opacities or effusions. No acute bony abnormality. Calcifications noted in the breasts bilaterally. IMPRESSION: No active disease. Electronically Signed    By: Janeece Mechanic M.D.   On: 09/16/2023 21:37   DG Knee 1-2 Views Left Result Date: 09/16/2023 CLINICAL DATA:  Pain EXAM: LEFT KNEE - 1-2 VIEW COMPARISON:  None Available. FINDINGS: The bones are diffusely osteopenic. There is an acute oblique fracture through the distal femoral diaphysis without significant displacement or angulation. Joint spaces are maintained and alignment is anatomic. Soft tissues are within normal limits. IMPRESSION: Acute oblique fracture through the distal femoral diaphysis. Electronically Signed   By: Tyron Gallon M.D.   On: 09/16/2023 21:36   DG HIP UNILAT WITH PELVIS 2-3 VIEWS RIGHT Result Date: 09/16/2023 CLINICAL DATA:  Fall, pain EXAM: DG HIP (WITH OR WITHOUT PELVIS) 2-3V RIGHT COMPARISON:  None Available. FINDINGS: There is no evidence of hip fracture or dislocation. There is no evidence of arthropathy or other focal bone abnormality. IMPRESSION: Negative. Electronically Signed   By: Janeece Mechanic M.D.   On: 09/16/2023 21:35   DG Foot 2 Views Left Result Date: 09/16/2023 CLINICAL DATA:  Fall, pain EXAM: LEFT FOOT - 2 VIEW COMPARISON:  None Available. FINDINGS: Diffuse osteopenia. No acute bony abnormality. Specifically, no fracture, subluxation, or dislocation. IMPRESSION: Osteopenia.  No acute bony  abnormality. Electronically Signed   By: Janeece Mechanic M.D.   On: 09/16/2023 21:35   DG Foot 2 Views Right Result Date: 09/16/2023 CLINICAL DATA:  Fall, pain EXAM: RIGHT FOOT - 2 VIEW COMPARISON:  None Available. FINDINGS: Osteopenia. No acute bony abnormality. Specifically, no fracture, subluxation, or dislocation. IMPRESSION: Osteopenia.  No acute bony abnormality. Electronically Signed   By: Janeece Mechanic M.D.   On: 09/16/2023 21:34   DG Ankle 2 Views Right Result Date: 09/16/2023 CLINICAL DATA:  Fall, pain EXAM: RIGHT ANKLE - 2 VIEW COMPARISON:  None Available. FINDINGS: Diffuse osteopenia. No acute bony abnormality. Specifically, no fracture, subluxation, or dislocation.  IMPRESSION: Osteopenia.  No acute bony abnormality. Electronically Signed   By: Janeece Mechanic M.D.   On: 09/16/2023 21:34   DG Ankle 2 Views Left Result Date: 09/16/2023 CLINICAL DATA:  Fall, pain EXAM: LEFT ANKLE - 2 VIEW COMPARISON:  None Available. FINDINGS: Diffuse osteopenia. No acute bony abnormality. Specifically, no fracture, subluxation, or dislocation. IMPRESSION: No acute bony abnormality.  Osteopenia Electronically Signed   By: Janeece Mechanic M.D.   On: 09/16/2023 21:33   DG Knee 1-2 Views Right Result Date: 09/16/2023 CLINICAL DATA:  Fall, pain EXAM: RIGHT KNEE - 1-2 VIEW COMPARISON:  None Available. FINDINGS: Diffuse osteopenia. No acute bony abnormality. Specifically, no fracture, subluxation, or dislocation. No joint effusion. IMPRESSION: Osteopenia.  No acute bony abnormality. Electronically Signed   By: Janeece Mechanic M.D.   On: 09/16/2023 21:33   CT Head Wo Contrast Result Date: 09/16/2023 CLINICAL DATA:  Head and neck trauma EXAM: CT HEAD WITHOUT CONTRAST CT CERVICAL SPINE WITHOUT CONTRAST TECHNIQUE: Multidetector CT imaging of the head and cervical spine was performed following the standard protocol without intravenous contrast. Multiplanar CT image reconstructions of the cervical spine were also generated. RADIATION DOSE REDUCTION: This exam was performed according to the departmental dose-optimization program which includes automated exposure control, adjustment of the mA and/or kV according to patient size and/or use of iterative reconstruction technique. COMPARISON:  CT brain 09/01/2023, 05/28/2023, CT cervical spine 04/19/2018 FINDINGS: CT HEAD FINDINGS Brain: No acute territorial infarction, hemorrhage or intracranial mass. Moderate atrophy.moderate chronic small vessel ischemic changes of the white matter. Multiple chronic appearing lacunar infarcts within the bilateral basal ganglia and thalami. Stable ventricle size. Vascular: No hyperdense vessels.  Carotid vascular calcification  Skull: No fracture Sinuses/Orbits: Mucosal thickening in the sinuses Other: None CT CERVICAL SPINE FINDINGS Alignment: No subluxation.  Facet alignment is within normal limits. Skull base and vertebrae: No acute fracture. No primary bone lesion or focal pathologic process. Soft tissues and spinal canal: No prevertebral fluid or swelling. No visible canal hematoma. Disc levels:  Mild degenerative changes C4-C5, C5-C6 and C6-C7 Upper chest: Negative. Other: None IMPRESSION: 1. Limited by habitus and positioning. No definite CT evidence for acute intracranial abnormality. Atrophy and chronic small vessel ischemic changes of the white matter. Multiple chronic appearing lacunar infarcts within the bilateral basal ganglia and thalami. 2. Degenerative changes of the cervical spine. No acute osseous abnormality. Electronically Signed   By: Esmeralda Hedge M.D.   On: 09/16/2023 19:42   CT Cervical Spine Wo Contrast Result Date: 09/16/2023 CLINICAL DATA:  Head and neck trauma EXAM: CT HEAD WITHOUT CONTRAST CT CERVICAL SPINE WITHOUT CONTRAST TECHNIQUE: Multidetector CT imaging of the head and cervical spine was performed following the standard protocol without intravenous contrast. Multiplanar CT image reconstructions of the cervical spine were also generated. RADIATION DOSE REDUCTION: This exam was performed according to  the departmental dose-optimization program which includes automated exposure control, adjustment of the mA and/or kV according to patient size and/or use of iterative reconstruction technique. COMPARISON:  CT brain 09/01/2023, 05/28/2023, CT cervical spine 04/19/2018 FINDINGS: CT HEAD FINDINGS Brain: No acute territorial infarction, hemorrhage or intracranial mass. Moderate atrophy.moderate chronic small vessel ischemic changes of the white matter. Multiple chronic appearing lacunar infarcts within the bilateral basal ganglia and thalami. Stable ventricle size. Vascular: No hyperdense vessels.  Carotid  vascular calcification Skull: No fracture Sinuses/Orbits: Mucosal thickening in the sinuses Other: None CT CERVICAL SPINE FINDINGS Alignment: No subluxation.  Facet alignment is within normal limits. Skull base and vertebrae: No acute fracture. No primary bone lesion or focal pathologic process. Soft tissues and spinal canal: No prevertebral fluid or swelling. No visible canal hematoma. Disc levels:  Mild degenerative changes C4-C5, C5-C6 and C6-C7 Upper chest: Negative. Other: None IMPRESSION: 1. Limited by habitus and positioning. No definite CT evidence for acute intracranial abnormality. Atrophy and chronic small vessel ischemic changes of the white matter. Multiple chronic appearing lacunar infarcts within the bilateral basal ganglia and thalami. 2. Degenerative changes of the cervical spine. No acute osseous abnormality. Electronically Signed   By: Esmeralda Hedge M.D.   On: 09/16/2023 19:42        Scheduled Meds:  calcitonin (salmon)  1 spray Alternating Nares Daily   chlorhexidine   60 mL Topical Once   divalproex  250 mg Oral QHS   DULoxetine   30 mg Oral BID   feeding supplement  237 mL Oral BID BM   gabapentin   300 mg Oral TID   levETIRAcetam   500 mg Oral BID   metoprolol  tartrate  12.5 mg Oral BID   multivitamin with minerals  1 tablet Oral Daily   povidone-iodine   2 Application Topical Once   QUEtiapine   12.5 mg Oral BID   Continuous Infusions:  sodium chloride  100 mL/hr at 09/17/23 2112    ceFAZolin  (ANCEF ) IV       LOS: 1 day    Time spent: 52 minutes spent on 09/18/2023 caring for this patient face-to-face including chart review, ordering labs/tests, documenting, discussion with nursing staff, consultants, updating family and interview/physical exam    Rema Care [place], DO [facility] Available via Epic secure chat 7am-7pm After these hours, please refer to coverage provider listed on [email or web address] 09/18/2023, 12:31 PM

## 2023-09-18 NOTE — Discharge Instructions (Signed)
 [facility] Discharge Instructions   General Discharge Instructions  WEIGHT BEARING STATUS:Weightbearing left lower extremity   RANGE OF MOTION/ACTIVITY: OK for hip and knee motion as tolerated  Wound Care: You may remove your surgical dressing on post op day 2 (Friday 09/20/23). Incisions can be left open to air if there is no drainage. Once the incision is completely dry and without drainage, it may be left open to air out.  Showering may begin post op day 3 (Saturday 09/21/23).  Clean incision gently with soap and water.  DVT/PE prophylaxis: Aspirin  325 mg daily x 30 days. May resume Aspirin  81 mg after 30 days  Diet: as you were eating previously.  Can use over the counter stool softeners and bowel preparations, such as Miralax , to help with bowel movements.  Narcotics can be constipating.  Be sure to drink plenty of fluids  PAIN MEDICATION USE AND EXPECTATIONS  You have likely been given narcotic medications to help control your pain.  After a traumatic event that results in an fracture (broken bone) with or without surgery, it is ok to use narcotic pain medications to help control one's pain.  We understand that everyone responds to pain differently and each individual patient will be evaluated on a regular basis for the continued need for narcotic medications. Ideally, narcotic medication use should last no more than 6-8 weeks (coinciding with fracture healing).   As a patient it is your responsibility as well to monitor narcotic medication use and report the amount and frequency you use these medications when you come to your office visit.   We would also advise that if you are using narcotic medications, you should take a dose prior to therapy to maximize you participation.  IF YOU ARE ON NARCOTIC MEDICATIONS IT IS NOT PERMISSIBLE TO OPERATE A MOTOR VEHICLE (MOTORCYCLE/CAR/TRUCK/MOPED) OR HEAVY MACHINERY DO NOT MIX NARCOTICS WITH OTHER CNS (CENTRAL NERVOUS SYSTEM)  DEPRESSANTS SUCH AS ALCOHOL  POST-OPERATIVE OPIOID TAPER INSTRUCTIONS: It is important to wean off of your opioid medication as soon as possible. If you do not need pain medication after your surgery it is ok to stop day one. Opioids include: Codeine, Hydrocodone (Norco, Vicodin), Oxycodone (Percocet, oxycontin ) and hydromorphone  amongst others.  Long term and even short term use of opiods can cause: Increased pain response Dependence Constipation Depression Respiratory depression And more.  Withdrawal symptoms can include Flu like symptoms Nausea, vomiting And more Techniques to manage these symptoms Hydrate well Eat regular healthy meals Stay active Use relaxation techniques(deep breathing, meditating, yoga) Do Not substitute Alcohol to help with tapering If you have been on opioids for less than two weeks and do not have pain than it is ok to stop all together.  Plan to wean off of opioids This plan should start within one week post op of your fracture surgery  Maintain the same interval or time between taking each dose and first decrease the dose.  Cut the total daily intake of opioids by one tablet each day Next start to increase the time between doses. The last dose that should be eliminated is the evening dose.    STOP SMOKING OR USING NICOTINE PRODUCTS!!!!  As discussed nicotine severely impairs your body's ability to heal surgical and traumatic wounds but also impairs bone healing.  Wounds and bone heal by forming microscopic blood vessels (angiogenesis) and nicotine is a vasoconstrictor (essentially, shrinks blood vessels).  Therefore, if vasoconstriction occurs to these microscopic blood vessels they essentially disappear and are unable to  deliver necessary nutrients to the healing tissue.  This is one modifiable factor that you can do to dramatically increase your chances of healing your injury.  (This means no smoking, no nicotine gum, patches, etc)  DO NOT USE  NONSTEROIDAL ANTI-INFLAMMATORY DRUGS (NSAID'S)  Using products such as Advil (ibuprofen), Aleve  (naproxen ), Motrin (ibuprofen) for additional pain control during fracture healing can delay and/or prevent the healing response.  If you would like to take over the counter (OTC) medication, Tylenol  (acetaminophen ) is ok.  However, some narcotic medications that are given for pain control contain acetaminophen  as well. Therefore, you should not exceed more than 4000 mg of tylenol  in a day if you do not have liver disease.  Also note that there are may OTC medicines, such as cold medicines and allergy medicines that my contain tylenol  as well.  If you have any questions about medications and/or interactions please ask your doctor/PA or your pharmacist.      ICE AND ELEVATE INJURED/OPERATIVE EXTREMITY  Using ice and elevating the injured extremity above your heart can help with swelling and pain control.  Icing in a pulsatile fashion, such as 20 minutes on and 20 minutes off, can be followed.    Do not place ice directly on skin. Make sure there is a barrier between to skin and the ice pack.    Using frozen items such as frozen peas works well as the conform nicely to the are that needs to be iced.  USE AN ACE WRAP OR TED HOSE FOR SWELLING CONTROL  In addition to icing and elevation, Ace wraps or TED hose are used to help limit and resolve swelling.  It is recommended to use Ace wraps or TED hose until you are informed to stop.    When using Ace Wraps start the wrapping distally (farthest away from the body) and wrap proximally (closer to the body)   Example: If you had surgery on your leg or thing and you do not have a splint on, start the ace wrap at the toes and work your way up to the thigh        If you had surgery on your upper extremity and do not have a splint on, start the ace wrap at your fingers and work your way up to the upper arm   CALL THE OFFICE FOR MEDICATION REFILLS OR WITH ANY  QUESTIONS/CONCERNS: [phone]   VISIT OUR WEBSITE FOR ADDITIONAL INFORMATION: [email or web address]  Discharge Wound Care Instructions  Do NOT apply any ointments, solutions or lotions to pin sites or surgical wounds.  These prevent needed drainage and even though solutions like hydrogen peroxide kill bacteria, they also damage cells lining the pin sites that help fight infection.  Applying lotions or ointments can keep the wounds moist and can cause them to breakdown and open up as well. This can increase the risk for infection. When in doubt call the office.  Surgical incisions should be dressed daily.  If any drainage is noted, use one layer of adaptic or Mepitel, then gauze, Kerlix, and an ace wrap. - These dressing supplies should be available at local medical supply stores ([facility], [facility], etc) as well as Insurance claims handler ([facility], [facility], [place], etc)  Once the incision is completely dry and without drainage, it may be left open to air out.  Showering may begin 36-48 hours later.  Cleaning gently with soap and water.   Call office for the following: Temperature greater than 101F Persistent nausea and  vomiting Severe uncontrolled pain Redness, tenderness, or signs of infection (pain, swelling, redness, odor or green/yellow discharge around the site) Difficulty breathing, headache or visual disturbances Hives Persistent dizziness or light-headedness Extreme fatigue Any other questions or concerns you may have after discharge  In an emergency, call 911 or go to an Emergency Department at a nearby hospital  OTHER HELPFUL INFORMATION  If you had a block, it will wear off between 8-24 hrs postop typically.  This is period when your pain may go from nearly zero to the pain you would have had postop without the block.  This is an abrupt transition but nothing dangerous is happening.  You may take an extra dose of narcotic when this happens.  You should wean off your  narcotic medicines as soon as you are able.  Most patients will be off or using minimal narcotics before their first postop appointment.   We suggest you use the pain medication the first night prior to going to bed, in order to ease any pain when the anesthesia wears off. You should avoid taking pain medications on an empty stomach as it will make you nauseous.  Do not drink alcoholic beverages or take illicit drugs when taking pain medications.  In most states it is against the law to drive while you are in a splint or sling.  And certainly against the law to drive while taking narcotics.  You may return to work/school in the next couple of days when you feel up to it.   Pain medication may make you constipated.  Below are a few solutions to try in this order: Decrease the amount of pain medication if you aren't having pain. Drink lots of decaffeinated fluids. Drink prune juice and/or each dried prunes  If the first 3 don't work start with additional solutions Take Colace - an over-the-counter stool softener Take Senokot - an over-the-counter laxative Take Miralax  - a stronger over-the-counter laxative

## 2023-09-18 NOTE — Anesthesia Procedure Notes (Signed)
 Procedure Name: Intubation Date/Time: 09/18/2023 2:59 PM  Performed by: Hebert Littler, CRNAPre-anesthesia Checklist: Patient identified, Emergency Drugs available, Suction available, Patient being monitored and Timeout performed Patient Re-evaluated:Patient Re-evaluated prior to induction Oxygen Delivery Method: Circle system utilized Preoxygenation: Pre-oxygenation with 100% oxygen Induction Type: IV induction Ventilation: Mask ventilation without difficulty Laryngoscope Size: Glidescope and 3 Grade View: Grade I Tube type: Oral Tube size: 7.0 mm Number of attempts: 1 Airway Equipment and Method: Patient positioned with wedge pillow and Stylet Placement Confirmation: ETT inserted through vocal cords under direct vision, positive ETCO2, CO2 detector and breath sounds checked- equal and bilateral Secured at: 21 cm Tube secured with: Tape

## 2023-09-18 NOTE — Plan of Care (Signed)
   Problem: Education: Goal: Knowledge of General Education information will improve Description Including pain rating scale, medication(s)/side effects and non-pharmacologic comfort measures Outcome: Progressing

## 2023-09-18 NOTE — Transfer of Care (Signed)
 Immediate Anesthesia Transfer of Care Note  Patient: Rose Bridges  Procedure(s) Performed: OPEN REDUCTION INTERNAL FIXATION (ORIF) DISTAL FEMUR FRACTURE (Left)  Patient Location: PACU  Anesthesia Type:General  Level of Consciousness: drowsy  Airway & Oxygen Therapy: Patient connected to face mask oxygen  Post-op Assessment: Report given to RN, Post -op Vital signs reviewed and stable, Patient moving all extremities, and Patient moving all extremities X 4  Post vital signs: Reviewed and stable  Last Vitals:  Vitals Value Taken Time  BP 134/81 09/18/23 1539  Temp    Pulse 68 09/18/23 1542  Resp 8 09/18/23 1542  SpO2 100 % 09/18/23 1542  Vitals shown include unfiled device data.  Last Pain:  Vitals:   09/18/23 1310  TempSrc: Oral  PainSc:       Patients Stated Pain Goal: 0 (09/18/23 1330)  Complications: No notable events documented.

## 2023-09-18 NOTE — Anesthesia Preprocedure Evaluation (Addendum)
 Anesthesia Evaluation  Patient identified by MRN, date of birth, ID band Patient confused    Reviewed: Allergy & Precautions, NPO status , Patient's Chart, lab work & pertinent test results  Airway Mallampati: II  TM Distance: >3 FB Neck ROM: Full    Dental no notable dental hx. (+) Edentulous Upper, Edentulous Lower   Pulmonary former smoker   Pulmonary exam normal        Cardiovascular hypertension, + Peripheral Vascular Disease   Rhythm:Regular Rate:Normal     Neuro/Psych Seizures -,   Anxiety Depression Bipolar Disorder   CVA    GI/Hepatic Neg liver ROS,GERD  Medicated,,  Endo/Other  negative endocrine ROS    Renal/GU   negative genitourinary   Musculoskeletal  (+) Arthritis , Osteoarthritis,    Abdominal Normal abdominal exam  (+)   Peds  Hematology Lab Results      Component                Value               Date                      WBC                      9.0                 09/18/2023                HGB                      13.5                09/18/2023                HCT                      40.9                09/18/2023                MCV                      109.7 (H)           09/18/2023                PLT                      216                 09/18/2023             Lab Results      Component                Value               Date                      NA                       140                 09/18/2023                K  4.7                 09/18/2023                CO2                      23                  09/18/2023                GLUCOSE                  132 (H)             09/18/2023                BUN                      10                  09/18/2023                CREATININE               0.35 (L)            09/18/2023                CALCIUM                   7.6 (L)             09/18/2023                GFRNONAA                 >60                  09/18/2023              Anesthesia Other Findings   Reproductive/Obstetrics                             Anesthesia Physical Anesthesia Plan  ASA: 3  Anesthesia Plan: General   Post-op Pain Management:    Induction: Intravenous  PONV Risk Score and Plan: 3 and Ondansetron , Dexamethasone , Midazolam  and Treatment may vary due to age or medical condition  Airway Management Planned: Mask and Oral ETT  Additional Equipment: None  Intra-op Plan:   Post-operative Plan: Extubation in OR  Informed Consent: I have reviewed the patients History and Physical, chart, labs and discussed the procedure including the risks, benefits and alternatives for the proposed anesthesia with the patient or authorized representative who has indicated his/her understanding and acceptance.     Consent reviewed with POA  Plan Discussed with: CRNA  Anesthesia Plan Comments:        Anesthesia Quick Evaluation

## 2023-09-18 NOTE — Op Note (Signed)
 Orthopaedic Surgery Operative Note (CSN: [id number] ) Date of Surgery: 09/18/2023  Admit Date: 09/16/2023   Diagnoses: Pre-Op Diagnoses: Left supracondylar distal femur fracture  Post-Op Diagnosis: Same  Procedures: CPT 27511-Open reduction internal fixation of left supracondylar distal femur fracture  Surgeons : Primary: Laneta Pintos, MD  Assistant: Alona Jamaica, PA-C  Location: OR 3   Anesthesia: General   Antibiotics: Ancef  2g preop with 1 gm vancomycin powder placed topically   Tourniquet time: None    Estimated Blood Loss: 50 mL  Complications:* No complications entered in OR log *   Specimens:* No specimens in log *   Implants: Implant Name Type Inv. Item Serial No. Manufacturer Lot No. LRB No. Used Action  CAP LOCK NCB - [id number] Cap CAP LOCK NCB  [employer](ORTH,TRAU,BIO,SG)  Left 9 Implanted  PLATE NCB 82N HIP - [id number] Plate PLATE NCB 84O HIP  [employer](ORTH,TRAU,BIO,SG)  Left 1 Implanted  SCREW 5.0 - [id number] Screw SCREW 5.0  [employer](ORTH,TRAU,BIO,SG)  Left 2 Implanted  SCREW NCB 2.4M01U2V2.2XST - [id number] Screw SCREW NCB [id number]  [employer](ORTH,TRAU,BIO,SG)  Left 3 Implanted  SCREW NCB 5.0X34MM - [id number] Screw SCREW NCB 5.0X34MM  [employer](ORTH,TRAU,BIO,SG)  Left 2 Implanted  SCREW UNICORTICAL 5.0X14 - [id number] Screw SCREW UNICORTICAL 5.0X14  [employer](ORTH,TRAU,BIO,SG)  Left 1 Implanted  SCREW NCB 4.0MX34M - [id number] Screw SCREW NCB 4.0MX34M  [employer](ORTH,TRAU,BIO,SG)  Left 1 Implanted     Indications for Surgery: 63 year old female who sustained a fall and a left supracondylar distal femur fracture.  Due to the unstable nature of her injury I recommend proceeding with open reduction internal fixation.  Risk and benefits were discussed with the patient and her daughter.  Risks include but not limited to bleeding, infection, malunion, nonunion, hardware failure, hardware rotation, nerve and blood  vessel injury, DVT, knee stiffness, even the possible anesthetic complications.  They agreed to proceed with surgery and consent was obtained.  Operative Findings: 1.  Open reduction to fixation of left supracondylar femur fracture using [employer] NCB distal femoral locking plate.  Procedure: The patient was identified in the preoperative holding area. Consent was confirmed with the patient and their family and all questions were answered. The operative extremity was marked after confirmation with the patient. she was then brought back to the operating room by our anesthesia colleagues.  She was placed under general anesthetic and carefully transferred over to radiolucent flattop table.  A bump was placed into her operative hip.  The left lower extremity was then prepped and draped in usual sterile fashion.  A timeout was performed to verify the patient, the procedure, and the extremity.  Preoperative antibiotics were dosed.  The hip and knee were flexed over a triangle and fluoroscopic imaging showed the unstable nature of her injury.  A lateral approach to the distal femur was made and carried down through skin and subcutaneous tissue.  I incised through the IT band and expose the lateral condyle of the femur.  I then developed the plane between the cortex and the vastus lateralis.  I then slid a 15 hole [employer] NCB distal femoral locking plate along the lateral cortex of the femur attached to a targeting arm.  I held the distal portion of the plate with a 2.0 mm guidewire.  I then percutaneously placed a 3.3 mm drill bit through the targeting arm just below the femoral prosthesis.  I then drilled and placed 5.0 millimeter screws distally to bring the plate flush  to bone.  I then percutaneously placed 5.0 millimeter screws in the femoral shaft.  I removed the 3.3 mm drill bit and placed a 4.0 millimeter screw.  I then drilled and placed the new cortical screw just lateral to the prosthesis.   Locking caps were placed on all the femoral shaft screws and the targeting arm was removed.  I then returned to the distal segment and placed 3 more 5.0 millimeter screws.  Locking caps were placed on all of the distal screws.  Final fluoroscopic imaging was obtained.  The incisions were copiously irrigated.  A gram of vancomycin powder was placed into the incision.  A layered closure of 0 Vicryl, 2-0 Monocryl and 3-0 Monocryl with Dermabond was used to close the skin.  Sterile dressing was applied.  The patient was then awoke from anesthesia and taken to the PACU in stable condition.  Post Op Plan/Instructions: The patient will be weightbearing as tolerated to the left lower extremity.  She will receive postoperative Ancef .  She will be placed on aspirin  for DVT prophylaxis.  Will have her mobilize with physical and Occupational Therapy.  I was present and performed the entire surgery.  Alona Jamaica, PA-C did assist me throughout the case. An assistant was necessary given the difficulty in approach, maintenance of reduction and ability to instrument the fracture.   Katheryne Pane, MD [facility]

## 2023-09-18 NOTE — Interval H&P Note (Signed)
 History and Physical Interval Note:  09/18/2023 1:15 PM  Rose Bridges  has presented today for surgery, with the diagnosis of left distal femur fracture.  The various methods of treatment have been discussed with the patient and family. After consideration of risks, benefits and other options for treatment, the patient has consented to  Procedure(s): OPEN REDUCTION INTERNAL FIXATION (ORIF) DISTAL FEMUR FRACTURE (Left) as a surgical intervention.  The patient's history has been reviewed, patient examined, no change in status, stable for surgery.  I have reviewed the patient's chart and labs.  Questions were answered to the patient's satisfaction.     Mahogani Holohan P Ashvik Grundman

## 2023-09-19 ENCOUNTER — Other Ambulatory Visit: Payer: Self-pay

## 2023-09-19 ENCOUNTER — Encounter (HOSPITAL_COMMUNITY): Payer: Self-pay | Admitting: Student

## 2023-09-19 DIAGNOSIS — Z66 Do not resuscitate: Secondary | ICD-10-CM

## 2023-09-19 DIAGNOSIS — S7290XA Unspecified fracture of unspecified femur, initial encounter for closed fracture: Secondary | ICD-10-CM | POA: Diagnosis not present

## 2023-09-19 DIAGNOSIS — Z7189 Other specified counseling: Secondary | ICD-10-CM | POA: Diagnosis not present

## 2023-09-19 DIAGNOSIS — Z515 Encounter for palliative care: Secondary | ICD-10-CM | POA: Diagnosis not present

## 2023-09-19 LAB — CBC
HCT: 38.5 % (ref 36.0–46.0)
Hemoglobin: 12.6 g/dL (ref 12.0–15.0)
MCH: 35.9 pg — ABNORMAL HIGH (ref 26.0–34.0)
MCHC: 32.7 g/dL (ref 30.0–36.0)
MCV: 109.7 fL — ABNORMAL HIGH (ref 80.0–100.0)
Platelets: 219 10*3/uL (ref 150–400)
RBC: 3.51 MIL/uL — ABNORMAL LOW (ref 3.87–5.11)
RDW: 13.6 % (ref 11.5–15.5)
WBC: 13.6 10*3/uL — ABNORMAL HIGH (ref 4.0–10.5)
nRBC: 0 % (ref 0.0–0.2)

## 2023-09-19 LAB — BASIC METABOLIC PANEL WITH GFR
Anion gap: 11 (ref 5–15)
BUN: 9 mg/dL (ref 8–23)
CO2: 21 mmol/L — ABNORMAL LOW (ref 22–32)
Calcium: 7.8 mg/dL — ABNORMAL LOW (ref 8.9–10.3)
Chloride: 106 mmol/L (ref 98–111)
Creatinine, Ser: 0.4 mg/dL — ABNORMAL LOW (ref 0.44–1.00)
GFR, Estimated: >60 mL/min (ref >60)
Glucose, Bld: 132 mg/dL — ABNORMAL HIGH (ref 70–99)
Potassium: 4.4 mmol/L (ref 3.5–5.1)
Sodium: 138 mmol/L (ref 135–145)

## 2023-09-19 LAB — GLUCOSE, CAPILLARY: Glucose-Capillary: 106 mg/dL — ABNORMAL HIGH (ref 70–99)

## 2023-09-19 LAB — MAGNESIUM: Magnesium: 2.1 mg/dL (ref 1.7–2.4)

## 2023-09-19 NOTE — Care Management Important Message (Signed)
 Important Message  Patient Details  Name: Rose Bridges MRN: [id number] Date of Birth: 08-04-1960   Important Message Given:  Yes - Medicare IM     Felix Host 09/19/2023, 12:46 PM

## 2023-09-19 NOTE — [place] FL2 (Signed)
 [place]  MEDICAID FL2 LEVEL OF CARE FORM     IDENTIFICATION  Patient Name: Rose Bridges Birthdate: Mar 24, 1961 Sex: female Admission Date (Current Location): 09/16/2023  [place] and [place] Number:  Ernesto Heady [id number] P Facility and Address:  [facility], [address] [place], [place] [address]      Provider Number: [id number]  Attending Physician Name and Address:  [place], Eric J, DO  Relative Name and Phone Number:  Dahms,Emily Daughter [phone]    Current Level of Care: Hospital Recommended Level of Care: Skilled Nursing Facility Prior Approval Number:    Date Approved/Denied:   PASRR Number: [id number]  Discharge Plan: SNF    Current Diagnoses: Patient Active Problem List   Diagnosis Date Noted   Hypokalemia 09/17/2023   Leukocytosis 09/17/2023   Sinus tachycardia 09/17/2023   Malnutrition of moderate degree 09/17/2023   Femur fracture (HCC) 09/16/2023   Palliative care encounter 09/05/2023   Acute metabolic encephalopathy 09/02/2023   Dehydration 09/02/2023   Acute prerenal azotemia 09/02/2023   Hypomagnesemia 09/02/2023   Prolonged QT interval 09/02/2023   History of bipolar disorder 09/02/2023   History of seizures 09/02/2023   GERD (gastroesophageal reflux disease) 09/02/2023   Agitation 09/01/2023   Major depressive disorder 05/30/2023   Medically noncompliant 05/29/2023   Acute encephalopathy 04/07/2023   AKI (acute kidney injury) (HCC) 04/07/2023   Acute cystitis 04/07/2023   Depression with anxiety 04/07/2023   Chronic pain 04/07/2023   Epilepsy (HCC)    Alcohol dependence (HCC) 05/22/2022   Delirium due to another medical condition, acute, hyperactive 05/18/2022   Acute pancreatitis without infection or necrosis 05/14/2022   Abnormal findings on imaging of biliary tract 05/14/2022   AMS (altered mental status) 05/10/2022   Pelvic fracture (HCC) 04/03/2022   Tobacco use 04/03/2022    Osteogenesis imperfecta 04/28/2018   Seizure (HCC) 03/26/2017    Orientation RESPIRATION BLADDER Height & Weight     Self  Normal Incontinent, External catheter Weight: 125 lb 10.6 oz (57 kg) Height:  5\' 4"  (162.6 cm)  BEHAVIORAL SYMPTOMS/MOOD NEUROLOGICAL BOWEL NUTRITION STATUS    Convulsions/Seizures Incontinent Diet (see discharge summary)  AMBULATORY STATUS COMMUNICATION OF NEEDS Skin   Total Care Verbally Surgical wounds, Other (Comment) (redness)                       Personal Care Assistance Level of Assistance  Bathing, Feeding, Dressing, Total care Bathing Assistance: Maximum assistance Feeding assistance: Maximum assistance Dressing Assistance: Maximum assistance Total Care Assistance: Maximum assistance   Functional Limitations Info  Sight, Hearing, Speech Sight Info: Adequate Hearing Info: Adequate Speech Info: Adequate    SPECIAL CARE FACTORS FREQUENCY  PT (By licensed PT), OT (By licensed OT)     PT Frequency: 5x week OT Frequency: 5x week            Contractures Contractures Info: Not present    Additional Factors Info  Code Status, Allergies Code Status Info: full Allergies Info: codeine           Current Medications (09/19/2023):  This is the current hospital active medication list Current Facility-Administered Medications  Medication Dose Route Frequency Provider Last Rate Last Admin   acetaminophen  (TYLENOL ) tablet 1,000 mg  1,000 mg Oral Q6H McClung, Sarah A, PA-C   1,000 mg at 09/19/23 0501   Or   acetaminophen  (TYLENOL ) suppository 650 mg  650 mg Rectal Q6H McClung, Sarah A, PA-C       ARIPiprazole (ABILIFY)  tablet 5 mg  5 mg Oral Daily Versie Gores, PA-C   5 mg at 09/19/23 1101   aspirin  tablet 325 mg  325 mg Oral Daily Versie Gores, PA-C   325 mg at 09/19/23 1103   bisacodyl  (DULCOLAX) suppository 10 mg  10 mg Rectal Daily PRN Versie Gores, PA-C       calcitonin (salmon) (MIACALCIN Marciano Settles) nasal spray 1 spray  1  spray Alternating Nares Daily Versie Gores, PA-C   1 spray at 09/19/23 1109   calcium  carbonate (TUMS - dosed in mg elemental calcium ) chewable tablet 200 mg of elemental calcium   1 tablet Oral BID Versie Gores, PA-C       ceFAZolin  (ANCEF ) IVPB 2g/100 mL premix  2 g Intravenous Q8H Versie Gores, PA-C 200 mL/hr at 09/19/23 0502 2 g at 09/19/23 0502   divalproex (DEPAKOTE ER) 24 hr tablet 250 mg  250 mg Oral QHS Versie Gores, PA-C   250 mg at 09/17/23 2207   docusate sodium  (COLACE) capsule 100 mg  100 mg Oral BID Versie Gores, PA-C   100 mg at 09/19/23 1103   DULoxetine  (CYMBALTA ) DR capsule 30 mg  30 mg Oral BID Versie Gores, PA-C   30 mg at 09/19/23 1100   feeding supplement (ENSURE ENLIVE / ENSURE PLUS) liquid 237 mL  237 mL Oral BID BM Versie Gores, PA-C   237 mL at 09/19/23 1104   gabapentin  (NEURONTIN ) capsule 300 mg  300 mg Oral TID Versie Gores, PA-C   300 mg at 09/19/23 1100   levETIRAcetam  (KEPPRA ) tablet 500 mg  500 mg Oral BID Versie Gores, PA-C   500 mg at 09/19/23 1101   LORazepam  (ATIVAN ) tablet 0.5 mg  0.5 mg Oral Q6H PRN Versie Gores, PA-C   0.5 mg at 09/19/23 1059   methocarbamol  (ROBAXIN ) tablet 500 mg  500 mg Oral Q6H PRN Versie Gores, PA-C       Or   methocarbamol  (ROBAXIN ) injection 500 mg  500 mg Intravenous Q6H PRN Versie Gores, PA-C       metoprolol  tartrate (LOPRESSOR ) injection 2.5 mg  2.5 mg Intravenous Q6H PRN Versie Gores, PA-C       metoprolol  tartrate (LOPRESSOR ) tablet 12.5 mg  12.5 mg Oral BID Versie Gores, PA-C   12.5 mg at 09/19/23 1100   morphine  (PF) 2 MG/ML injection 0.5 mg  0.5 mg Intravenous Q2H PRN Versie Gores, PA-C   0.5 mg at 09/19/23 0355   multivitamin with minerals tablet 1 tablet  1 tablet Oral Daily Versie Gores, PA-C       oxyCODONE  (Oxy IR/ROXICODONE ) immediate release tablet 2.5-5 mg  2.5-5 mg Oral Q6H PRN Versie Gores, PA-C   5 mg at 09/19/23 1059   pantoprazole  (PROTONIX ) EC  tablet 40 mg  40 mg Oral QAC breakfast Versie Gores, PA-C   40 mg at 09/19/23 1101   polyethylene glycol (MIRALAX  / GLYCOLAX ) packet 17 g  17 g Oral Daily PRN Versie Gores, PA-C       prochlorperazine  (COMPAZINE ) injection 10 mg  10 mg Intravenous Q6H PRN Versie Gores, PA-C       QUEtiapine  (SEROQUEL ) tablet 12.5 mg  12.5 mg Oral BID Alona Jamaica A, PA-C   12.5 mg at 09/19/23 1100   Vitamin D  (Ergocalciferol ) (DRISDOL ) 1.25 MG (50000 UNIT) capsule 50,000 Units  50,000 Units Oral Q Francina Irish, Isa Manuel  A, PA-C   50,000 Units at 09/19/23 1108     Discharge Medications: Please see discharge summary for a list of discharge medications.  Relevant Imaging Results:  Relevant Lab Results:   Additional Information SS#[id number]  Elspeth Hals, LCSW

## 2023-09-19 NOTE — Progress Notes (Signed)
 PROGRESS NOTE    Nimisha Rathel  GNF:[id number] DOB: 01/05/1961 DOA: 09/16/2023 PCP: Diona Franklin    Brief Narrative:   Rose Bridges is a 63 y.o. female with past medical history significant for seizure disorder, osteogenesis imperfecta, bipolar disorder, chronic pain syndrome who presented to [facility] ED from [facility]F via EMS after fall at facility with pain to her left lower extremity.  Workup in the ED notable for left distal femur fracture on imaging.  Orthopedics was consulted.  TRH consulted for admission for further evaluation management of left distal femur fracture.  Assessment & Plan:   Left distal femur fracture Patient presenting from long-term care facility after fall with left lower extremity pain.  Most recent PT notes from 5/6, baseline nonambulatory and LTC SNF resident; although has been noncompliant which likely led to fall and sustaining injury.  Left knee x-ray with acute oblique fracture through the distal femoral diaphysis.  Vitamin D  25-hydroxy level 98.66.  Orthopedics was consulted and patient underwent ORIF by Dr. Curtiss Dowdy on 09/17/2023. -- WBAT LLE -- ASA 325mg  PO daily for postoperative DVT prophylaxis per orthopedics -- Oxycodone  2.5 - 5 mg PO q4h PRN moderate pain -- Morphine  0.5 mg IV q2h PRN severe pain -- Robaxin  500 mg p.o. every 6 hours as needed muscle spasms -- PT/OT evaluation: Pending -- Plan to return to [facility] long-term care likely tomorrow  Sinus tachycardia TSH within normal limits.  Recently admitted with some SVT noted on telemetry. -- Started on metoprolol  tartrate 12.5 g p.o. twice daily -- Continue monitor on telemetry  Hypokalemia Hypomagnesemia Repleted, repeat 4.4 this morning.  Magnesium  2.1.  -- Repeat electrolytes in a.m.  Seizure disorder -- Keppra  500 mg p.o. twice daily -- Depakote 250 mg p.o. nightly  Bipolar disorder -- Seroquel  12.5 mg p.o. twice daily -- Abilify 5 p.o. p.o.  daily  Chronic pain -- Gabapentin  300 mg p.o. 3 times daily -- Cymbalta  30 mg p.o. 3 times daily  Anxiety -- Ativan  0.5 Thomson p.o. every 6 hours as needed anxiety  Cognitive impairment Patient with scheduled follow-up with neurologist in a few months. --Delirium precautions --Get up during the day --Encourage a familiar face to remain present throughout the day --Keep blinds open and lights on during daylight hours --Minimize the use of opioids/benzodiazepines  Osteogenesis imperfecta Continue calcitonin  Protein calorie malnutrition, moderate Nutrition Status: Nutrition Problem: Moderate Malnutrition Etiology: chronic illness (cognitive impairment) Signs/Symptoms: mild fat depletion, moderate muscle depletion, severe muscle depletion Interventions: Ensure Enlive (each supplement provides 350kcal and 20 grams of protein), Magic cup, MVI -- Continue supplementation, increase oral intake  Goals of care: Conversation with previous hospitalist, Dr. Lydia Sams with daughter who requested placing patient on DNR status based on conversation with palliative care during last admission.    DVT prophylaxis: SCDs Start: 09/18/23 1713 SCDs Start: 09/17/23 0340    Code Status: Limited: Do not attempt resuscitation (DNR) -DNR-LIMITED -Do Not Intubate/DNI  Family Communication: Updated family present at bedside this morning  Disposition Plan:  Level of care: Telemetry Medical Status is: Inpatient Remains inpatient appropriate because: Awaiting PT/OT evaluation, anticipate return to long-term care at [facility] tomorrow    Consultants:  Orthopedics  Procedures:  TTE  Antimicrobials:  Perioperative cefazolin    Subjective: Patient seen examined bedside, lying in bed.  Family present at bedside.  Pain controlled.  Eating breakfast.  No complaints this morning.  Awaiting to see PT and OT.  Discussed anticipate return to [facility].  No questions, concerns or complaints at  this time.  Denies headache, no chest pain, no shortness of breath, abdominal pain, no fever.  No acute events overnight per nursing staff.  Objective: Vitals:   09/18/23 1712 09/19/23 0013 09/19/23 0445 09/19/23 0808  BP: 127/75 129/84 126/65 (!) 150/88  Pulse: 72 84 72 75  Resp: 14 16 15 18   Temp: 98.2 F (36.8 C) 98.2 F (36.8 C) 97.7 F (36.5 C) (!) 97.1 F (36.2 C)  TempSrc: Oral Axillary Axillary   SpO2: 99% 99% 98% 98%  Weight:      Height:        Intake/Output Summary (Last 24 hours) at 09/19/2023 1110 Last data filed at 09/19/2023 0900 Gross per 24 hour  Intake 920 ml  Output 700 ml  Net 220 ml   Filed Weights   09/18/23 1310  Weight: 57 kg    Examination:  Physical Exam: GEN: NAD, alert and oriented x 3, elderly appearance HEENT: NCAT, PERRL, EOMI, sclera clear, MMM PULM: CTAB w/o wheezes/crackles, normal respiratory effort CV: Tachycardic, regular rhythm w/o M/G/R GI: abd soft, NTND, + BS MSK: no peripheral edema, moves all extremities independently, left lower extremity surgical dressing in place, clean/dry/intact with no surrounding erythema/fluctuance or drainage NEURO: No focal neurological deficits, sensation to light touch intact PSYCH: normal mood/affect Integumentary: Left lower extremity surgical incision site/dressing as above, otherwise no concerning rashes/lesions/wounds none exposed skin surfaces    Data Reviewed: I have personally reviewed following labs and imaging studies  CBC: Recent Labs  Lab 09/16/23 1815 09/17/23 1311 09/18/23 0653 09/19/23 0551  WBC 14.2* 9.0 9.0 13.6*  NEUTROABS 10.5*  --   --   --   HGB 15.5* 16.4* 13.5 12.6  HCT 48.3* 49.5* 40.9 38.5  MCV 111.5* 109.3* 109.7* 109.7*  PLT 239 243 216 219   Basic Metabolic Panel: Recent Labs  Lab 09/16/23 2052 09/17/23 1311 09/18/23 0653 09/19/23 0551  NA 141 142 140 138  K 3.4* 4.1 4.7 4.4  CL 109 106 110 106  CO2 27 24 23  21*  GLUCOSE 126* 91 132* 132*  BUN 13  13 10 9   CREATININE 0.48 0.44 0.35* 0.40*  CALCIUM  7.9* 8.4* 7.6* 7.8*  MG  --   --  1.5* 2.1   GFR: Estimated Creatinine Clearance: 61.3 mL/min (A) (by C-G formula based on SCr of 0.4 mg/dL (L)). Liver Function Tests: Recent Labs  Lab 09/16/23 2052 09/17/23 1311  AST 30 34  ALT 35 38  ALKPHOS 76 89  BILITOT 0.3 0.8  PROT 4.4* 5.4*  ALBUMIN 1.8* 2.3*   No results for input(s): "LIPASE", "AMYLASE" in the last 168 hours. No results for input(s): "AMMONIA" in the last 168 hours. Coagulation Profile: No results for input(s): "INR", "PROTIME" in the last 168 hours. Cardiac Enzymes: No results for input(s): "CKTOTAL", "CKMB", "CKMBINDEX", "TROPONINI" in the last 168 hours. BNP (last 3 results) No results for input(s): "PROBNP" in the last 8760 hours. HbA1C: No results for input(s): "HGBA1C" in the last 72 hours. CBG: Recent Labs  Lab 09/17/23 1735 09/18/23 0002 09/18/23 0637 09/18/23 1157 09/18/23 1738  GLUCAP 81 119* 123* 120* 112*   Lipid Profile: No results for input(s): "CHOL", "HDL", "LDLCALC", "TRIG", "CHOLHDL", "LDLDIRECT" in the last 72 hours. Thyroid  Function Tests: Recent Labs    09/17/23 1322  TSH 0.878   Anemia Panel: Recent Labs    09/17/23 1322  VITAMINB12 1,708*  FOLATE 13.4   Sepsis Labs: No results for input(s): "  PROCALCITON", "LATICACIDVEN" in the last 168 hours.  Recent Results (from the past 240 hours)  Surgical pcr screen     Status: None   Collection Time: 09/17/23  7:21 AM   Specimen: Nasal Mucosa; Nasal Swab  Result Value Ref Range Status   MRSA, PCR NEGATIVE NEGATIVE Final   Staphylococcus aureus NEGATIVE NEGATIVE Final    Comment: (NOTE) The Xpert SA Assay (FDA approved for NASAL specimens in patients 57 years of age and older), is one component of a comprehensive surveillance program. It is not intended to diagnose infection nor to guide or monitor treatment. Performed at [facility], [address], [place],  [place] [address]          Radiology Studies: DG FEMUR PORT MIN 2 VIEWS LEFT Result Date: 09/18/2023 CLINICAL DATA:  Fracture, postop EXAM: LEFT FEMUR PORTABLE 2 VIEWS COMPARISON:  Preoperative imaging FINDINGS: Lateral plate and screw fixation of distal femur fracture. Stable fracture alignment. Recent postsurgical change includes air and edema in the soft tissues. Previous hip arthroplasty. IMPRESSION: ORIF of distal femur fracture without immediate postoperative complication. Electronically Signed   By: Chadwick Colonel M.D.   On: 09/18/2023 16:10   DG FEMUR MIN 2 VIEWS LEFT Result Date: 09/18/2023 CLINICAL DATA:  Elective surgery EXAM: LEFT FEMUR 2 VIEWS COMPARISON:  Preoperative imaging FINDINGS: Five fluoroscopic spot views of the left femur submitted from the operating room. Lateral plate and screw fixation of distal femur fracture. Fluoroscopy time 29 seconds. Dose 1.72 mGy. IMPRESSION: Intraoperative fluoroscopy during distal femur fracture fixation. Electronically Signed   By: Chadwick Colonel M.D.   On: 09/18/2023 16:09   DG C-Arm 1-60 Min-No Report Result Date: 09/18/2023 Fluoroscopy was utilized by the requesting physician.  No radiographic interpretation.   ECHOCARDIOGRAM COMPLETE Result Date: 09/17/2023    ECHOCARDIOGRAM REPORT   Patient Name:   Rose Bridges Date of Exam: 09/17/2023 Medical Rec #:  [id number]                  Height:       64.0 in Accession #:    [id number]                 Weight:       125.7 lb Date of Birth:  1960-10-29                  BSA:          1.606 m Patient Age:    63 years                   BP:           114/87 mmHg Patient Gender: F                          HR:           112 bpm. Exam Location:  Inpatient Procedure: 2D Echo, Cardiac Doppler and Color Doppler (Both Spectral and Color            Flow Doppler were utilized during procedure). Indications:    Dyspnea R06.00  History:        Patient has prior history of Echocardiogram examinations, most                  recent 11/10/2016. Arrythmias:Tachycardia; Risk Factors:Current                 Smoker.  Sonographer:  Terrilee Few RCS Referring Phys: Charlean Congress, M  Sonographer Comments: Image acquisition challenging due to patient behavioral factors. IMPRESSIONS  1. Left ventricular ejection fraction, by estimation, is 60 to 65%. The left ventricle has normal function. The left ventricle has no regional wall motion abnormalities. Left ventricular diastolic parameters are consistent with Grade I diastolic dysfunction (impaired relaxation).  2. Right ventricular systolic function is normal. The right ventricular size is normal.  3. The mitral valve is normal in structure. No evidence of mitral valve regurgitation. No evidence of mitral stenosis.  4. The aortic valve is normal in structure. Aortic valve regurgitation is trivial. No aortic stenosis is present. Comparison(s): Prior images reviewed side by side. Diastolic LV parameters are slightly worse, but left heart filling pressures remain normal. FINDINGS  Left Ventricle: Left ventricular ejection fraction, by estimation, is 60 to 65%. The left ventricle has normal function. The left ventricle has no regional wall motion abnormalities. The left ventricular internal cavity size was normal in size. There is  no left ventricular hypertrophy. Left ventricular diastolic parameters are consistent with Grade I diastolic dysfunction (impaired relaxation). Normal left ventricular filling pressure. Right Ventricle: The right ventricular size is normal. No increase in right ventricular wall thickness. Right ventricular systolic function is normal. Left Atrium: Left atrial size was normal in size. Right Atrium: Right atrial size was normal in size. Pericardium: Trivial pericardial effusion is present. Presence of epicardial fat layer. Mitral Valve: The mitral valve is normal in structure. No evidence of mitral valve regurgitation. No evidence of mitral valve stenosis.  Tricuspid Valve: The tricuspid valve is normal in structure. Tricuspid valve regurgitation is trivial. Aortic Valve: The aortic valve is normal in structure. Aortic valve regurgitation is trivial. No aortic stenosis is present. Aortic valve peak gradient measures 5.4 mmHg. Pulmonic Valve: The pulmonic valve was not well visualized. Aorta: The aortic root and ascending aorta are structurally normal, with no evidence of dilitation. IAS/Shunts: The interatrial septum was not well visualized.  LEFT VENTRICLE PLAX 2D LVIDd:         3.30 cm   Diastology LVIDs:         2.50 cm   LV e' medial:    5.98 cm/s LV PW:         0.70 cm   LV E/e' medial:  8.0 LV IVS:        0.70 cm   LV e' lateral:   6.85 cm/s LVOT diam:     1.90 cm   LV E/e' lateral: 7.0 LV SV:         27 LV SV Index:   17 LVOT Area:     2.84 cm  RIGHT VENTRICLE             IVC RV S prime:     13.80 cm/s  IVC diam: 1.00 cm TAPSE (M-mode): 1.6 cm LEFT ATRIUM         Index LA diam:    1.80 cm 1.12 cm/m  AORTIC VALVE AV Area (Vmax): 1.72 cm AV Vmax:        116.00 cm/s AV Peak Grad:   5.4 mmHg LVOT Vmax:      70.20 cm/s LVOT Vmean:     46.100 cm/s LVOT VTI:       0.094 m  AORTA Ao Root diam: 2.60 cm Ao Asc diam:  2.70 cm MITRAL VALVE               TRICUSPID VALVE MV Area (PHT):  6.65 cm    TR Peak grad:   20.6 mmHg MV Decel Time: 114 msec    TR Vmax:        227.00 cm/s MV E velocity: 47.70 cm/s MV A velocity: 68.70 cm/s  SHUNTS MV E/A ratio:  0.69        Systemic VTI:  0.09 m                            Systemic Diam: 1.90 cm Mihai Croitoru MD Electronically signed by Luana Rumple MD Signature Date/Time: 09/17/2023/5:47:21 PM    Final         Scheduled Meds:  acetaminophen   1,000 mg Oral Q6H   Or   acetaminophen   650 mg Rectal Q6H   ARIPiprazole  5 mg Oral Daily   aspirin   325 mg Oral Daily   calcitonin (salmon)  1 spray Alternating Nares Daily   calcium  carbonate  1 tablet Oral BID   divalproex  250 mg Oral QHS   docusate sodium   100 mg Oral BID    DULoxetine   30 mg Oral BID   feeding supplement  237 mL Oral BID BM   gabapentin   300 mg Oral TID   levETIRAcetam   500 mg Oral BID   metoprolol  tartrate  12.5 mg Oral BID   multivitamin with minerals  1 tablet Oral Daily   pantoprazole   40 mg Oral QAC breakfast   QUEtiapine   12.5 mg Oral BID   Vitamin D  (Ergocalciferol )  50,000 Units Oral Q Thu   Continuous Infusions:  sodium chloride  100 mL/hr at 09/19/23 0409    ceFAZolin  (ANCEF ) IV 2 g (09/19/23 0502)     LOS: 2 days    Time spent: 52 minutes spent on 09/19/2023 caring for this patient face-to-face including chart review, ordering labs/tests, documenting, discussion with nursing staff, consultants, updating family and interview/physical exam    Rema Care [place], DO [facility] Available via Epic secure chat 7am-7pm After these hours, please refer to coverage provider listed on [email or web address] 09/19/2023, 11:10 AM

## 2023-09-19 NOTE — Consult Note (Signed)
 Consultation Note Date: 09/19/2023   Patient Name: Rose Bridges  DOB: 02-Feb-1961  MRN: [id number]  Age / Sex: 63 y.o., female  PCP: [name] Referring Physician: [place], Eric J, DO  Reason for Consultation: Establishing goals of care  HPI/Patient Profile: 63 y.o. female  with past medical history of seizure disorder, osteogenesis imperfecta, bipolar disorder, and chronic pain syndrome admitted on 09/16/2023 with after a fall and found to have a left femur fracture.  Patient underwent ORIF on 5/20.  PMT consulted to discuss goals of care.  Of note, patient was recently seen by palliative care earlier this month and changed to DNR.  Clinical Assessment and Goals of Care: I have reviewed medical records including EPIC notes, labs and imaging, received report from social worker, assessed the patient and then met with patient to discuss diagnosis prognosis, GOC, EOL wishes, disposition and options.  It became clear patient was unable to independently participate in goals of care conversation.  She was fixated on someone named Marcille Severance.  She was able to tell me that her hip was hurting.  Requested RN administer as needed medication.  Called to her Sherrel Dodge.  [name] is her ex daughter-in-law.  We do not have a copy of his HCPOA document but [name] has this document.  [name] follows previous goals of care conversation with palliative provider.  At that time decision was made to transition patient to DNR/DNI.  We discussed patient's current mental status.  [name] tells me that Marcille Severance is somebody that works at the patient's facility she lives that.  She tells me she is frequently fixated on this person along with a couple of other family members.  [name] shares the patient has not walked in over a year.  She tells me her appetite has decreased over the last couple of months.  She also shares of more confusion recently.   We discussed  patient's current illness and what it means in the larger context of patient's on-going co-morbidities.   Discussed with [name] the importance of continued conversation with family and the medical providers regarding overall plan of care and treatment options, ensuring decisions are within the context of the patient's values and GOCs.    Palliative Care services outpatient were explained and offered.  [name] is interested in more support from palliative care to follow at [facility].  Questions and concerns were addressed. The family was encouraged to call with questions or concerns.  Primary Decision Maker HCPOA    SUMMARY OF RECOMMENDATIONS   -DNR/DNI - Patient to DC to rehab soon, have requested outpatient palliative follow - Agree with current pain control  Code Status/Advance Care Planning: DNR     Primary Diagnoses: Present on Admission:  Femur fracture (HCC)  Chronic pain  GERD (gastroesophageal reflux disease)   I have reviewed the medical record, interviewed the patient and family, and examined the patient. The following aspects are pertinent.  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[facility])    Metabolic encephalopathy    Osteogenesis imperfecta    Osteogenesis imperfecta    Osteoporosis    Personality disorder (HCC)  PVD (peripheral vascular disease) (HCC)    Stroke [facility])    Social History   Socioeconomic History   Marital status: Divorced    Spouse name: Not on file   Number of children: 1   Years of education: College   Highest education level: Not on file  Occupational History   Occupation: Film/video editor  Tobacco Use    Smoking status: Former    Current packs/day: 0.00    Types: Cigarettes    Quit date: 2020    Years since quitting: 5.3   Smokeless tobacco: Never  Vaping Use   Vaping status: Never Used  Substance and Sexual Activity   Alcohol use: No    Alcohol/week: 85.0 standard drinks of alcohol    Types: 85 Shots of liquor per week    Comment: No alcohol use since 11/04/16.   Drug use: No   Sexual activity: Not Currently  Other Topics Concern   Not on file  Social History Narrative   Pt lives with her mother.   Right-handed.   1-2 bottles (16Oz each) of [facility] or Dr. Kathlene Paradise.   Occasionally drinks Monster drinks.   Social Drivers of Corporate investment banker Strain: Not on file  Food Insecurity: Patient Unable To Answer (09/17/2023)   Hunger Vital Sign    Worried About Running Out of Food in the Last Year: Patient unable to answer    Ran Out of Food in the Last Year: Patient unable to answer  Transportation Needs: Patient Unable To Answer (09/17/2023)   PRAPARE - Administrator, Civil Service (Medical): Patient unable to answer    Lack of Transportation (Non-Medical): Patient unable to answer  Physical Activity: Not on file  Stress: Not on file  Social Connections: Not on file   Family History  Problem Relation Age of Onset   Stroke Mother    Heart attack Mother    Hypertension Mother    Stroke Sister    Stroke Brother    Lung cancer Father    Scheduled Meds:  acetaminophen   1,000 mg Oral Q6H   Or   acetaminophen   650 mg Rectal Q6H   ARIPiprazole  5 mg Oral Daily   aspirin   325 mg Oral Daily   calcitonin (salmon)  1 spray Alternating Nares Daily   calcium  carbonate  1 tablet Oral BID   divalproex  250 mg Oral QHS   docusate sodium   100 mg Oral BID   DULoxetine   30 mg Oral BID   feeding supplement  237 mL Oral BID BM   gabapentin   300 mg Oral TID   levETIRAcetam   500 mg Oral BID   metoprolol  tartrate  12.5 mg Oral BID   multivitamin with minerals  1  tablet Oral Daily   pantoprazole   40 mg Oral QAC breakfast   QUEtiapine   12.5 mg Oral BID   Vitamin D  (Ergocalciferol )  50,000 Units Oral Q Thu   Continuous Infusions:   ceFAZolin  (ANCEF ) IV 2 g (09/19/23 1411)   PRN Meds:.bisacodyl , LORazepam , methocarbamol  **OR** methocarbamol  (ROBAXIN ) injection, metoprolol  tartrate, morphine  injection, oxyCODONE , polyethylene glycol, prochlorperazine  Allergies  Allergen Reactions   Codeine Other (See Comments)    Migraines    Review of Systems  Unable to perform ROS: Mental status change    Physical Exam Constitutional:      General: She is not in acute distress.    Appearance: She is ill-appearing.  Pulmonary:     Effort: Pulmonary effort is  normal.  Skin:    General: Skin is warm and dry.  Neurological:     Mental Status: She is alert. She is disoriented.  Psychiatric:        Mood and Affect: Affect is angry.        Behavior: Behavior is agitated.     Vital Signs: BP (!) 150/88 (BP Location: Left Arm)   Pulse 75   Temp (!) 97.1 F (36.2 C)   Resp 18   Ht 5\' 4"  (1.626 m)   Wt 57 kg   SpO2 98%   BMI 21.57 kg/m  Pain Scale: Not given for pain POSS *See Group Information*: S-Acceptable,Sleep, easy to arouse Pain Score: Asleep   SpO2: SpO2: 98 % O2 Device:SpO2: 98 % O2 Flow Rate: .O2 Flow Rate (L/min): 6 L/min  IO: Intake/output summary:  Intake/Output Summary (Last 24 hours) at 09/19/2023 1429 Last data filed at 09/19/2023 0900 Gross per 24 hour  Intake 920 ml  Output 700 ml  Net 220 ml    LBM:   Baseline Weight: Weight: 57 kg Most recent weight: Weight: 57 kg     Palliative Assessment/Data: PPS 30%     *Please note that this is a verbal dictation therefore any spelling or grammatical errors are due to the "Dragon Medical One" system interpretation.   Time Total: 55 minutes Time spent includes: Detailed review of medical records (labs, imaging, vital signs), medically appropriate exam, discussion with  treatment team, counseling and educating patient, family and/or staff, documenting clinical information, medication management and coordination of care.    Alvino Aye, DNP, AGNP-C Palliative Medicine Team [phone] Pager: [phone]

## 2023-09-19 NOTE — TOC Initial Note (Addendum)
 Transition of Care [facility]) - Initial/Assessment Note    Patient Details  Name: Rose Bridges MRN: [id number] Date of Birth: Sep 15, 1960  Transition of Care [facility]) CM/SW Contact:    Elspeth Hals, LCSW Phone Number: 09/19/2023, 2:11 PM  Clinical Narrative:       Pt oriented x 1, quite agitated when CSW spoke with her, angry at someone named Marcille Severance.  Not able to provide any information.  CSW spoke with daughter [name] by phone. She confirmed pt is LTC resident at [facility], she does want her to return for rehab there.  Discussed likely DC tomorrow.  Medicare payer with inpt order 5/20.  CSW confirmed with [name] that they can receive pt tomorrow.            1430: Message from [employer]  Daughter [name] requesting outpt palliative referral.  CSW spoke with [name] again by phone, choice discussed and she would like to move forward with [facility].  CSW made referral to [facility].    Expected Discharge Plan: Skilled Nursing Facility Barriers to Discharge: Continued Medical Work up   Patient Goals and CMS Choice     Choice offered to / list presented to : Adult Children (daughter [name])      Expected Discharge Plan and Services In-house Referral: Clinical Social Work   Post Acute Care Choice: Skilled Nursing Facility Living arrangements for the past 2 months: Skilled Nursing Facility [facility])                                      Prior Living Arrangements/Services Living arrangements for the past 2 months: Skilled Nursing Facility Risk analyst) Lives with:: Facility Resident Patient language and need for interpreter reviewed:: Yes        Need for Family Participation in Patient Care: Yes (Comment) Care giver support system in place?: Yes (comment) Current home services: Other (comment) (na) Criminal Activity/Legal Involvement Pertinent to Current Situation/Hospitalization: No - Comment as needed  Activities of Daily Living       Permission Sought/Granted                  Emotional Assessment Appearance:: Appears stated age Attitude/Demeanor/Rapport: Angry Affect (typically observed): Agitated Orientation: : Oriented to Self      Admission diagnosis:  Femur fracture (HCC) [S72.90XA] Closed fracture of distal end of left femur, unspecified fracture morphology, initial encounter (HCC) [S72.402A] Patient Active Problem List   Diagnosis Date Noted   Hypokalemia 09/17/2023   Leukocytosis 09/17/2023   Sinus tachycardia 09/17/2023   Malnutrition of moderate degree 09/17/2023   Femur fracture (HCC) 09/16/2023   Palliative care encounter 09/05/2023   Acute metabolic encephalopathy 09/02/2023   Dehydration 09/02/2023   Acute prerenal azotemia 09/02/2023   Hypomagnesemia 09/02/2023   Prolonged QT interval 09/02/2023   History of bipolar disorder 09/02/2023   History of seizures 09/02/2023   GERD (gastroesophageal reflux disease) 09/02/2023   Agitation 09/01/2023   Major depressive disorder 05/30/2023   Medically noncompliant 05/29/2023   Acute encephalopathy 04/07/2023   AKI (acute kidney injury) (HCC) 04/07/2023   Acute cystitis 04/07/2023   Depression with anxiety 04/07/2023   Chronic pain 04/07/2023   Epilepsy (HCC)    Alcohol dependence (HCC) 05/22/2022   Delirium due to another medical condition, acute, hyperactive 05/18/2022   Acute pancreatitis without infection or necrosis 05/14/2022   Abnormal findings on imaging of biliary tract 05/14/2022   AMS (altered mental status)  05/10/2022   Pelvic fracture (HCC) 04/03/2022   Tobacco use 04/03/2022   Osteogenesis imperfecta 04/28/2018   Seizure (HCC) 03/26/2017   PCP:  [facility]:   [facility] [place], [place] - [address] [place] [address] [place] [address] [place] [address] Phone: [phone] Fax: [phone]     Social Drivers of Health (SDOH) Social History: SDOH Screenings   Food Insecurity: Patient Unable To Answer  (09/17/2023)  Housing: Patient Unable To Answer (09/17/2023)  Transportation Needs: Patient Unable To Answer (09/17/2023)  Utilities: Patient Unable To Answer (09/17/2023)  Depression (PHQ2-9): Medium Risk (09/09/2019)  Tobacco Use: Medium Risk (09/18/2023)   SDOH Interventions:     Readmission Risk Interventions    04/11/2023   11:14 AM  Readmission Risk Prevention Plan  Transportation Screening Complete  PCP or Specialist Appt within 3-5 Days Complete  HRI or Home Care Consult Complete  Social Work Consult for Recovery Care Planning/Counseling Complete  Palliative Care Screening Complete  Medication Review Oceanographer) Complete

## 2023-09-19 NOTE — Anesthesia Postprocedure Evaluation (Signed)
 Anesthesia Post Note  Patient: Rose Bridges  Procedure(s) Performed: OPEN REDUCTION INTERNAL FIXATION (ORIF) DISTAL FEMUR FRACTURE (Left)     Patient location during evaluation: PACU Anesthesia Type: General Level of consciousness: awake and alert Pain management: pain level controlled Vital Signs Assessment: post-procedure vital signs reviewed and stable Respiratory status: spontaneous breathing, nonlabored ventilation, respiratory function stable and patient connected to nasal cannula oxygen Cardiovascular status: blood pressure returned to baseline and stable Postop Assessment: no apparent nausea or vomiting Anesthetic complications: no   No notable events documented.  Last Vitals:  Vitals:   09/19/23 0013 09/19/23 0445  BP: 129/84 126/65  Pulse: 84 72  Resp: 16 15  Temp: 36.8 C 36.5 C  SpO2: 99% 98%    Last Pain:  Vitals:   09/19/23 0445  TempSrc: Axillary  PainSc:                  Valente Gaskin Alverta Caccamo

## 2023-09-19 NOTE — Evaluation (Signed)
 Physical Therapy Evaluation Patient Details Name: Rose Bridges MRN: [id number] DOB: 06-Dec-1960 Today's Date: 09/19/2023  History of Present Illness  Pt is a 63 y.o. female admitted 5/19 from fall at [facility] Recent admission 2 weeks ago for UTI. Imaging showed L supracondylar distal femur fx. ORIF performed 5/21. PMH:  bipolar disorder, personality disorder, osteogenesis imperfecta, epilepsy, HTN, PVD, CVA  Clinical Impression   Pt presents with severe LLE post-operative pain, impaired balance with history of falls, agitation with therapies, and decreased activity tolerance secondary to pain. Pt to benefit from acute PT to address deficits. Pt requiring max +2 for rolling in bed, pt initiating roll but after this pt screaming in pain. Pt VERY resistant to PT handling LLE given pain. Unsure of pt's true baseline as no family at bedside and pt is not a reliable historian. PT to progress mobility as tolerated, and will continue to follow acutely.          If plan is discharge home, recommend the following: Two people to help with walking and/or transfers;Two people to help with bathing/dressing/bathroom;Direct supervision/assist for medications management;Direct supervision/assist for financial management;Assist for transportation;Help with stairs or ramp for entrance;Supervision due to cognitive status   Can travel by private vehicle   No    Equipment Recommendations None recommended by PT  Recommendations for Other Services       Functional Status Assessment Patient has had a recent decline in their functional status and demonstrates the ability to make significant improvements in function in a reasonable and predictable amount of time.     Precautions / Restrictions Precautions Precautions: Fall Restrictions Weight Bearing Restrictions Per Provider Order: No      Mobility  Bed Mobility Overal bed mobility: Needs Assistance Bed Mobility: Rolling Rolling:  Max assist, +2 for physical assistance         General bed mobility comments: roll towards R x2 with max +2 assist, pt initiating reaching for rail and starting roll.    Transfers                   General transfer comment: deferred, pt screaming in pain    Ambulation/Gait                  Stairs            Wheelchair Mobility     Tilt Bed    Modified Rankin (Stroke Patients Only)       Balance Overall balance assessment: Needs assistance, History of Falls     Sitting balance - Comments: unable to assess this date       Standing balance comment: unable to assess this date                             Pertinent Vitals/Pain Pain Assessment Pain Assessment: Faces Pain Score: 10-Worst pain ever Pain Location: LLE during any mobility Pain Descriptors / Indicators: Crying, Moaning, Nagging, Grimacing, Guarding Pain Intervention(s): Limited activity within patient's tolerance, Monitored during session, Repositioned, Patient requesting pain meds-RN notified    Home Living Family/patient expects to be discharged to:: Skilled nursing facility                   Additional Comments: per notes 2 weeks ago, pt is a LTC SNF resident    Prior Function Prior Level of Function : Patient poor historian/Family not available  Mobility Comments: transfer-level, though pt stating "I don't get up" ADLs Comments: assume significant assist for all ADLs     Extremity/Trunk Assessment   Upper Extremity Assessment Upper Extremity Assessment: Defer to OT evaluation    Lower Extremity Assessment Lower Extremity Assessment: Generalized weakness;Difficult to assess due to impaired cognition;LLE deficits/detail LLE Deficits / Details: PT lfited LLE in full extension, pt screaming in pain so unable to assess PROM or AROM LLE: Unable to fully assess due to pain       Communication   Communication Communication:  Impaired Factors Affecting Communication: Difficulty expressing self;Reduced clarity of speech    Cognition Arousal: Alert Behavior During Therapy: Restless, Agitated   PT - Cognitive impairments: History of cognitive impairments                       PT - Cognition Comments: Pt restless initially, becomes agitated stating to PT "you don't have a thought in your head, if you touch my leg again I will kill you". Pt confabulates throughout session Following commands: Impaired Following commands impaired: Follows one step commands inconsistently     Cueing Cueing Techniques: Verbal cues     General Comments      Exercises     Assessment/Plan    PT Assessment Patient needs continued PT services  PT Problem List Decreased strength;Decreased mobility;Decreased activity tolerance;Decreased balance;Decreased knowledge of use of DME;Pain;Decreased safety awareness       PT Treatment Interventions DME instruction;Therapeutic activities;Therapeutic exercise;Patient/family education;Balance training;Functional mobility training;Neuromuscular re-education    PT Goals (Current goals can be found in the Care Plan section)  Acute Rehab PT Goals PT Goal Formulation: Patient unable to participate in goal setting Time For Goal Achievement: 10/03/23 Potential to Achieve Goals: Fair    Frequency Min 2X/week     Co-evaluation               AM-PAC PT "6 Clicks" Mobility  Outcome Measure Help needed turning from your back to your side while in a flat bed without using bedrails?: A Lot Help needed moving from lying on your back to sitting on the side of a flat bed without using bedrails?: Total Help needed moving to and from a bed to a chair (including a wheelchair)?: Total Help needed standing up from a chair using your arms (e.g., wheelchair or bedside chair)?: Total Help needed to walk in hospital room?: Total Help needed climbing 3-5 steps with a railing? : Total 6 Click  Score: 7    End of Session   Activity Tolerance: Patient tolerated treatment well Patient left: in bed;with call bell/phone within reach;with bed alarm set Nurse Communication: Mobility status;Patient requests pain meds PT Visit Diagnosis: Other abnormalities of gait and mobility (R26.89);Muscle weakness (generalized) (M62.81)    Time: 1027-2536 PT Time Calculation (min) (ACUTE ONLY): 10 min   Charges:   PT Evaluation $PT Eval Low Complexity: 1 Low   PT General Charges $$ ACUTE PT VISIT: 1 Visit         Shirlene Doughty, PT DPT Acute Rehabilitation Services Secure Chat Preferred  Office [phone]   Tommaso Cavitt Cydney Draft 09/19/2023, 12:27 PM

## 2023-09-19 NOTE — Evaluation (Signed)
 Occupational Therapy Evaluation Patient Details Name: Rose Bridges MRN: [id number] DOB: [birth year] Today's Date: 09/19/2023   History of Present Illness   Pt is a 63 y.o. female admitted 5/19 from fall at [facility]. Recent admission 2 weeks ago for UTI. Imaging showed L supracondylar distal femur fx. ORIF performed 5/21. PMH:  bipolar disorder, personality disorder, osteogenesis imperfecta, epilepsy, HTN, PVD, CVA     Clinical Impressions Pt admitted based on above, and was seen based on problem list below. PTA pt was living at snf, non ambulatory, unsure of the amount of ADL assistance pt required. Today pt presents with decreased cog and weakness in LLE.  Pt agitated, in pain, and refusing to follow commands. Pt confabulatory with disorganized speech. Pt would benefit from <3 hours of skilled rehab daily. OT will continue to follow acutely.        If plan is discharge home, recommend the following:   Two people to help with walking and/or transfers;Two people to help with bathing/dressing/bathroom     Functional Status Assessment   Patient has had a recent decline in their functional status and demonstrates the ability to make significant improvements in function in a reasonable and predictable amount of time.     Equipment Recommendations   Other (comment) (Defer to next venue)     Recommendations for Other Services         Precautions/Restrictions   Precautions Precautions: Fall Recall of Precautions/Restrictions: Impaired Restrictions Weight Bearing Restrictions Per Provider Order: Yes LLE Weight Bearing Per Provider Order: Weight bearing as tolerated     Mobility Bed Mobility Overal bed mobility: Needs Assistance Bed Mobility: Rolling Rolling: Max assist, +2 for physical assistance         General bed mobility comments: roll towards R x2 with max +2 assist, pt initiating reaching for rail and starting roll.    Transfers        General transfer comment: deferred d/t pt's pain and agitation level      Balance Overall balance assessment: Needs assistance, History of Falls     Sitting balance - Comments: Unable to assess       ADL either performed or assessed with clinical judgement   ADL Overall ADL's : Needs assistance/impaired Eating/Feeding: Moderate assistance;Bed level   Grooming: Maximal assistance;Bed level   Upper Body Bathing: Maximal assistance;Bed level   Lower Body Bathing: Maximal assistance;+2 for physical assistance;Bed level   Upper Body Dressing : Maximal assistance;Bed level   Lower Body Dressing: Maximal assistance;Bed level;+2 for physical assistance                 General ADL Comments: Pt declining transfers at this time. Limited eval, clinical judgement used based on pt's cog level     Vision Baseline Vision/History: 1 Wears glasses Vision Assessment?: No apparent visual deficits            Pertinent Vitals/Pain Pain Assessment Pain Assessment: Faces Faces Pain Scale: Hurts worst Pain Location: LLE Pain Descriptors / Indicators: Crying, Moaning, Nagging, Grimacing, Guarding Pain Intervention(s): Limited activity within patient's tolerance     Extremity/Trunk Assessment Upper Extremity Assessment Upper Extremity Assessment: Generalized weakness;Difficult to assess due to impaired cognition   Lower Extremity Assessment Lower Extremity Assessment: Defer to PT evaluation LLE Deficits / Details: PT lfited LLE in full extension, pt screaming in pain so unable to assess PROM or AROM LLE: Unable to fully assess due to pain   Cervical / Trunk Assessment Cervical / Trunk Assessment:  Normal   Communication Communication Communication: Impaired Factors Affecting Communication: Difficulty expressing self;Reduced clarity of speech   Cognition Arousal: Alert Behavior During Therapy: Restless, Agitated Cognition: History of cognitive impairments, Cognition  impaired   Orientation impairments: Place, Time, Situation Awareness: Intellectual awareness impaired, Online awareness impaired Memory impairment (select all impairments): Short-term memory, Working memory Attention impairment (select first level of impairment): Sustained attention Executive functioning impairment (select all impairments): Initiation, Organization, Sequencing, Reasoning, Problem solving OT - Cognition Comments: Pt confabulatory and using disorganzied speech during therapy session. Agitated as well                 Following commands: Impaired Following commands impaired: Follows one step commands inconsistently     Cueing  General Comments   Cueing Techniques: Verbal cues  Pt agitated during session stating " if you touch me, I'll kill you" and ref to follow some commands           Home Living Family/patient expects to be discharged to:: Skilled nursing facility       Additional Comments: Per chart review pt is LTC snf resident      Prior Functioning/Environment Prior Level of Function : Patient poor historian/Family not available     Mobility Comments: Per chart review non ambulatory  for last year ADLs Comments: assume significant assist for all ADLs    OT Problem List: Decreased strength;Decreased activity tolerance;Impaired balance (sitting and/or standing);Decreased cognition;Decreased safety awareness   OT Treatment/Interventions: Self-care/ADL training;Neuromuscular education;Therapeutic exercise;Energy conservation;Therapeutic activities;Patient/family education;Balance training      OT Goals(Current goals can be found in the care plan section)   Acute Rehab OT Goals Patient Stated Goal: None stated OT Goal Formulation: Patient unable to participate in goal setting Time For Goal Achievement: 10/03/23 Potential to Achieve Goals: Fair   OT Frequency:  Min 1X/week    Co-evaluation PT/OT/SLP Co-Evaluation/Treatment: Yes Reason for  Co-Treatment: For patient/therapist safety;Necessary to address cognition/behavior during functional activity;To address functional/ADL transfers   OT goals addressed during session: ADL's and self-care      AM-PAC OT "6 Clicks" Daily Activity     Outcome Measure Help from another person eating meals?: A Lot Help from another person taking care of personal grooming?: A Lot Help from another person toileting, which includes using toliet, bedpan, or urinal?: Total Help from another person bathing (including washing, rinsing, drying)?: A Lot Help from another person to put on and taking off regular upper body clothing?: A Lot Help from another person to put on and taking off regular lower body clothing?: A Lot 6 Click Score: 11   End of Session Nurse Communication: Mobility status  Activity Tolerance: Treatment limited secondary to agitation Patient left: in bed;with call bell/phone within reach;with bed alarm set  OT Visit Diagnosis: Unsteadiness on feet (R26.81);Other abnormalities of gait and mobility (R26.89)                Time: 2956-2130 OT Time Calculation (min): 10 min Charges:  OT General Charges $OT Visit: 1 Visit OT Evaluation $OT Eval Moderate Complexity: 1 Mod  Tyne Banta C, OT  Acute Rehabilitation Services Office [phone] Secure chat preferred   Mickael Alamo 09/19/2023, 1:19 PM

## 2023-09-19 NOTE — Progress Notes (Signed)
 Orthopaedic Trauma Progress Note  SUBJECTIVE: Doing ok this afternoon.  Resting comfortably in bed.  No chest pain. No SOB. No nausea/vomiting. No other complaints.  No family at bedside currently  OBJECTIVE:  Vitals:   09/19/23 0445 09/19/23 0808  BP: 126/65 (!) 150/88  Pulse: 72 75  Resp: 15 18  Temp: 97.7 F (36.5 C) (!) 97.1 F (36.2 C)  SpO2: 98% 98%    Opiates Today (MME): Today's  total administered Morphine  Milligram Equivalents: 9 Opiates Yesterday (MME): Yesterday's total administered Morphine  Milligram Equivalents: 30  General: Resting in bed.  No acute distress Respiratory: No increased work of breathing.  Operative Extremity (left lower extremity): Dressing clean, dry, intact.  Tenderness throughout the thigh as expected.  No significant calf tenderness.  Ankle motion stiff but equal to contralateral limb.  Compartments soft and compressible. + DP pulse  IMAGING: Stable post op imaging.   LABS:  Results for orders placed or performed during the hospital encounter of 09/16/23 (from the past 24 hours)  Glucose, capillary     Status: Abnormal   Collection Time: 09/18/23  5:38 PM  Result Value Ref Range   Glucose-Capillary 112 (H) 70 - 99 mg/dL  VITAMIN D  25 Hydroxy (Vit-D Deficiency, Fractures)     Status: None   Collection Time: 09/18/23  8:22 PM  Result Value Ref Range   Vit D, 25-Hydroxy 98.46 30 - 100 ng/mL  CBC     Status: Abnormal   Collection Time: 09/19/23  5:51 AM  Result Value Ref Range   WBC 13.6 (H) 4.0 - 10.5 K/uL   RBC 3.51 (L) 3.87 - 5.11 MIL/uL   Hemoglobin 12.6 12.0 - 15.0 g/dL   HCT 14.7 82.9 - 56.2 %   MCV 109.7 (H) 80.0 - 100.0 fL   MCH 35.9 (H) 26.0 - 34.0 pg   MCHC 32.7 30.0 - 36.0 g/dL   RDW 13.0 86.5 - 78.4 %   Platelets 219 150 - 400 K/uL   nRBC 0.0 0.0 - 0.2 %  Basic metabolic panel with GFR     Status: Abnormal   Collection Time: 09/19/23  5:51 AM  Result Value Ref Range   Sodium 138 135 - 145 mmol/L   Potassium 4.4 3.5 - 5.1  mmol/L   Chloride 106 98 - 111 mmol/L   CO2 21 (L) 22 - 32 mmol/L   Glucose, Bld 132 (H) 70 - 99 mg/dL   BUN 9 8 - 23 mg/dL   Creatinine, Ser 6.96 (L) 0.44 - 1.00 mg/dL   Calcium  7.8 (L) 8.9 - 10.3 mg/dL   GFR, Estimated >29 >52 mL/min   Anion gap 11 5 - 15  Magnesium      Status: None   Collection Time: 09/19/23  5:51 AM  Result Value Ref Range   Magnesium  2.1 1.7 - 2.4 mg/dL    ASSESSMENT: Rose Bridges is a 63 y.o. female, 1 Day Post-Op s/p mechanical fall Procedures: OPEN REDUCTION INTERNAL FIXATION LEFT DISTAL FEMUR FRACTURE  CV/Blood loss: Acute blood loss anemia, Hgb 0.6 this AM. Hemodynamically stable  PLAN: Weightbearing: WBAT LLE ROM: Unrestricted ROM Incisional and dressing care: Reinforce dressings as needed  Showering:  ok to begin getting incisions wet 09/21/23 Orthopedic device(s): None  Pain management: Continue current multimodal regimen VTE prophylaxis: Aspirin , SCDs ID:  Ancef  2gm post op Foley/Lines:  No foley, KVO IVFs Impediments to Fracture Healing: Vitamin D  level 98, no additional supplementation needed Dispo: PT/OT evaluation today, recommending return to SNF.  Okay for discharge from ortho standpoint once cleared by medicine team and therapies  D/C recommendations: - Oxycodone  2.5 mg, Robaxin  for pain control - Aspirin  for DVT prophylaxis - No additional need for Vit D supplementation  Follow - up plan: 2 weeks after d/c for wound check and repeat x-rays   Contact information:  Katheryne Pane MD, Alona Jamaica PA-C. After hours and holidays please check [email or web address] for group call information for [facility]   Edilia Gordon, PA-C [phone] (office) [email or web address]

## 2023-09-20 ENCOUNTER — Emergency Department (HOSPITAL_COMMUNITY)
Admission: EM | Admit: 2023-09-20 | Discharge: 2023-09-23 | Disposition: A | Discharge status: Other Acute Inpatient Hospital

## 2023-09-20 ENCOUNTER — Encounter (HOSPITAL_COMMUNITY): Payer: Self-pay

## 2023-09-20 ENCOUNTER — Other Ambulatory Visit: Payer: Self-pay

## 2023-09-20 DIAGNOSIS — Z7982 Long term (current) use of aspirin: Secondary | ICD-10-CM | POA: Diagnosis not present

## 2023-09-20 DIAGNOSIS — Z79899 Other long term (current) drug therapy: Secondary | ICD-10-CM | POA: Diagnosis not present

## 2023-09-20 DIAGNOSIS — I10 Essential (primary) hypertension: Secondary | ICD-10-CM | POA: Diagnosis not present

## 2023-09-20 DIAGNOSIS — Z7689 Persons encountering health services in other specified circumstances: Secondary | ICD-10-CM | POA: Diagnosis present

## 2023-09-20 DIAGNOSIS — Z8673 Personal history of transient ischemic attack (TIA), and cerebral infarction without residual deficits: Secondary | ICD-10-CM | POA: Insufficient documentation

## 2023-09-20 DIAGNOSIS — S7290XA Unspecified fracture of unspecified femur, initial encounter for closed fracture: Secondary | ICD-10-CM | POA: Diagnosis not present

## 2023-09-20 LAB — CBC
HCT: 35.6 % — ABNORMAL LOW (ref 36.0–46.0)
Hemoglobin: 11.6 g/dL — ABNORMAL LOW (ref 12.0–15.0)
MCH: 36.5 pg — ABNORMAL HIGH (ref 26.0–34.0)
MCHC: 32.6 g/dL (ref 30.0–36.0)
MCV: 111.9 fL — ABNORMAL HIGH (ref 80.0–100.0)
Platelets: 171 10*3/uL (ref 150–400)
RBC: 3.18 MIL/uL — ABNORMAL LOW (ref 3.87–5.11)
RDW: 13.6 % (ref 11.5–15.5)
WBC: 10.9 10*3/uL — ABNORMAL HIGH (ref 4.0–10.5)
nRBC: 0 % (ref 0.0–0.2)

## 2023-09-20 LAB — GLUCOSE, CAPILLARY: Glucose-Capillary: 97 mg/dL (ref 70–99)

## 2023-09-20 MED ORDER — METOPROLOL TARTRATE 25 MG PO TABS: 12.5000 mg | ORAL_TABLET | Freq: Two times a day (BID) | ORAL | Status: AC

## 2023-09-20 MED ORDER — OXYCODONE HCL 5 MG PO TABS: 2.5000 mg | ORAL_TABLET | Freq: Four times a day (QID) | ORAL | 0 refills | Status: AC | PRN

## 2023-09-20 MED ORDER — METHOCARBAMOL 500 MG PO TABS: 500.0000 mg | ORAL_TABLET | Freq: Three times a day (TID) | ORAL | 0 refills | Status: AC | PRN

## 2023-09-20 MED ORDER — DOCUSATE SODIUM 100 MG PO CAPS: 100.0000 mg | ORAL_CAPSULE | Freq: Two times a day (BID) | ORAL | Status: AC

## 2023-09-20 MED ORDER — LEVETIRACETAM (KEPPRA) 500 MG/5 ML ADULT IV PUSH
500.0000 mg | Freq: Once | INTRAVENOUS | Status: AC
  Administered 2023-09-20: 500 mg via INTRAVENOUS
  Filled 2023-09-20: qty 5

## 2023-09-20 MED ORDER — LORAZEPAM 0.5 MG PO TABS: 0.5000 mg | ORAL_TABLET | Freq: Four times a day (QID) | ORAL | 0 refills | Status: AC | PRN

## 2023-09-20 MED ORDER — OXYCODONE HCL 5 MG PO TABS
2.5000 mg | ORAL_TABLET | Freq: Four times a day (QID) | ORAL | Status: DC | PRN
  Administered 2023-09-21 – 2023-09-22 (×3): 5 mg via ORAL
  Filled 2023-09-20 (×3): qty 1

## 2023-09-20 MED ORDER — ACETAMINOPHEN 500 MG PO TABS
1000.0000 mg | ORAL_TABLET | Freq: Three times a day (TID) | ORAL | Status: DC
  Administered 2023-09-20 – 2023-09-23 (×6): 1000 mg via ORAL
  Filled 2023-09-20 (×6): qty 2

## 2023-09-20 MED ORDER — LEVETIRACETAM 500 MG PO TABS
500.0000 mg | ORAL_TABLET | Freq: Two times a day (BID) | ORAL | Status: DC
  Administered 2023-09-20 – 2023-09-23 (×5): 500 mg via ORAL
  Filled 2023-09-20 (×5): qty 1

## 2023-09-20 MED ORDER — ASPIRIN 325 MG PO TABS: 325.0000 mg | ORAL_TABLET | Freq: Every day | ORAL | 0 refills | Status: AC
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
## 2023-09-20 NOTE — TOC Transition Note (Signed)
 Transition of Care [facility]) - Discharge Note   Patient Details  Name: Rose Bridges MRN: [id number] Date of Birth: 07-10-1960  [facility]) CM/SW Contact:  Elspeth Hals, LCSW Phone Number: 09/20/2023, 11:22 AM   Clinical Narrative:   Pt discharge to [facility], room 508p. RN call report to [phone].  [name] called [phone].   1050: CSW confirmed with [name] that they can receive pt today.       Final next level of care: Skilled Nursing Facility Barriers to Discharge: Barriers Resolved   Patient Goals and CMS Choice     Choice offered to / list presented to : Adult Children (daughter [name])      Discharge Placement              Patient chooses bed at: [facility] Patient to be transferred to facility by: ptar Name of family member notified: daughter [name] Patient and family notified of of transfer: 09/20/23  Discharge Plan and Services Additional resources added to the After Visit Summary for   In-house Referral: Clinical Social Work   Post Acute Care Choice: Skilled Nursing Facility                               Social Drivers of Health (SDOH) Interventions SDOH Screenings   Food Insecurity: Patient Unable To Answer (09/17/2023)  Housing: Patient Unable To Answer (09/17/2023)  Transportation Needs: Patient Unable To Answer (09/17/2023)  Utilities: Patient Unable To Answer (09/17/2023)  Depression (PHQ2-9): Medium Risk (09/09/2019)  Tobacco Use: Medium Risk (09/18/2023)     Readmission Risk Interventions    04/11/2023   11:14 AM  Readmission Risk Prevention Plan  Transportation Screening Complete  PCP or Specialist Appt within 3-5 Days Complete  HRI or Home Care Consult Complete  Social Work Consult for Recovery Care Planning/Counseling Complete  Palliative Care Screening Complete  Medication Review Oceanographer) Complete

## 2023-09-20 NOTE — Progress Notes (Signed)
 SLP Cancellation Note  Patient Details Name: Rose Bridges MRN: [id number] DOB: 1960-07-05   Cancelled treatment:       Reason Eval/Treat Not Completed: Other (comment). Attempted swallow eval, pt unable to participate due to cognition. She was verbose and verbally aggressive with any attempts at gentle intervention. RN reports she did take her pills this am with a drink without difficulty. Reported to MD. Advise Dys 2 diet with thin liquids given missing dentition, though PO not observed with SLP.    Lamona Eimer, Hardin Leys 09/20/2023, 10:37 AM

## 2023-09-20 NOTE — Discharge Summary (Signed)
 Physician Discharge Summary  Rose Bridges NFA:[id number] DOB: Sep 06, 1960 DOA: 09/16/2023  PCP: Diona Franklin  Admit date: 09/16/2023 Discharge date: 09/20/2023  Admitted From: [facility] Disposition: [name]  Recommendations for Outpatient Follow-up:  Follow up with PCP in 1-2 weeks Follow-up with orthopedics, Dr. Curtiss Dowdy 2 weeks Follow-up with palliative care outpatient  Discharge Condition: Stable CODE STATUS: DNR Diet recommendation: Dysphagia 2 diet with thin liquids if has missing dentition  History of present illness:  Rose Bridges is a 63 y.o. female with past medical history significant for seizure disorder, osteogenesis imperfecta, bipolar disorder, chronic pain syndrome who presented to [facility] ED from [facility] via EMS after fall at facility with pain to her left lower extremity.  Workup in the ED notable for left distal femur fracture on imaging.  Orthopedics was consulted.  TRH consulted for admission for further evaluation management of left distal femur fracture.   Hospital course:  Left distal femur fracture Patient presenting from long-term care facility after fall with left lower extremity pain.  Most recent PT notes from 5/6, baseline nonambulatory and LTC SNF resident; although has been noncompliant which likely led to fall and sustaining injury.  Left knee x-ray with acute oblique fracture through the distal femoral diaphysis.  Vitamin D  25-hydroxy level 98.66.  Orthopedics was consulted and patient underwent ORIF by Dr. Curtiss Dowdy on 09/17/2023. WBAT LLE.  ASA 325mg  PO daily for postoperative DVT prophylaxis per orthopedics.  Oxycodone /Robaxin  as needed pain/muscle spasm.  Discharging back to [facility] long-term care.  Outpatient follow-up with orthopedics 2 weeks.   Sinus tachycardia TSH within normal limits.  Recently admitted with some SVT noted on telemetry. Started on metoprolol  tartrate 12.5 g p.o. twice daily    Hypokalemia Hypomagnesemia Repleted during hospitalization.   Seizure disorder Continue Keppra  500 mg p.o. twice daily, Depakote 250 mg p.o. nightly   Bipolar disorder Seroquel  12.5 mg p.o. twice daily, Abilify 5 p.o. p.o. daily   Chronic pain Gabapentin  300 mg p.o. 3 times daily, Cymbalta  30 mg p.o. daily   Anxiety Ativan  0.5 mg p.o. every 6 hours as needed anxiety   Cognitive impairment Patient with scheduled follow-up with neurologist in a few months.   Osteogenesis imperfecta Continue calcitonin   Protein calorie malnutrition, moderate Nutrition Status: Nutrition Problem: Moderate Malnutrition Etiology: chronic illness (cognitive impairment) Signs/Symptoms: mild fat depletion, moderate muscle depletion, severe muscle depletion Interventions: Ensure Enlive (each supplement provides 350kcal and 20 grams of protein), Magic cup, MVI -- Continue supplementation, increase oral intake   Goals of care: Conversation with previous hospitalist, Dr. Lydia Sams with daughter who requested placing patient on DNR status based on conversation with palliative care during last admission.  Palliative care to follow-up outpatient.  Discharge Diagnoses:  Principal Problem:   Femur fracture (HCC) Active Problems:   Seizure (HCC)   Osteogenesis imperfecta   Epilepsy (HCC)   Chronic pain   History of bipolar disorder   GERD (gastroesophageal reflux disease)   Hypokalemia   Leukocytosis   Sinus tachycardia   Malnutrition of moderate degree    Discharge Instructions  Discharge Instructions     Call MD for:  difficulty breathing, headache or visual disturbances   Complete by: As directed    Call MD for:  extreme fatigue   Complete by: As directed    Call MD for:  persistant dizziness or light-headedness   Complete by: As directed    Call MD for:  persistant nausea and vomiting   Complete  by: As directed    Call MD for:  severe uncontrolled pain   Complete by: As directed    Call  MD for:  temperature >100.4   Complete by: As directed    Diet - low sodium heart healthy   Complete by: As directed    Increase activity slowly   Complete by: As directed       Allergies as of 09/20/2023       Reactions   Codeine Other (See Comments)   Migraines        Medication List     STOP taking these medications    aspirin  81 MG chewable tablet Replaced by: aspirin  325 MG tablet   QUEtiapine  25 MG tablet Commonly known as: SEROQUEL    Vitamin D  (Ergocalciferol ) 1.25 MG (50000 UNIT) Caps capsule Commonly known as: DRISDOL        TAKE these medications    acetaminophen  500 MG tablet Commonly known as: TYLENOL  Take 1,000 mg by mouth 3 (three) times daily.   ARIPiprazole 5 MG tablet Commonly known as: ABILIFY Take 5 mg by mouth daily.   aspirin  325 MG tablet Take 1 tablet (325 mg total) by mouth daily. Replaces: aspirin  81 MG chewable tablet   Biofreeze Cool The Pain 4 % Gel Generic drug: Menthol  (Topical Analgesic) Apply 1 application  topically See admin instructions. Apply to both hands and knees 2 times a day   bisacodyl  10 MG suppository Commonly known as: DULCOLAX Place 10 mg rectally daily as needed (for constipation).   calcitonin (salmon) 200 UNIT/ACT nasal spray Commonly known as: MIACALCIN /FORTICAL Place 1 spray into alternate nostrils daily.   calcium  carbonate 500 MG chewable tablet Commonly known as: TUMS - dosed in mg elemental calcium  Chew 1 tablet by mouth 2 (two) times daily.   celecoxib  50 MG capsule Commonly known as: CELEBREX  Take 50 mg by mouth daily.   Depakote ER 250 MG 24 hr tablet Generic drug: divalproex Take 250 mg by mouth at bedtime.   docusate sodium  100 MG capsule Commonly known as: COLACE Take 1 capsule (100 mg total) by mouth 2 (two) times daily.   DULoxetine  30 MG capsule Commonly known as: CYMBALTA  Take 1 capsule (30 mg total) by mouth daily.   FIBER PO Take 0.4 g by mouth 3 (three) times daily  between meals.   gabapentin  300 MG capsule Commonly known as: NEURONTIN  Take 1 capsule (300 mg total) by mouth 3 (three) times daily.   levETIRAcetam  500 MG tablet Commonly known as: KEPPRA  Take 1 tablet (500 mg total) by mouth 2 (two) times daily.   LORazepam  0.5 MG tablet Commonly known as: ATIVAN  Take 1 tablet (0.5 mg total) by mouth every 6 (six) hours as needed for anxiety.   magnesium  oxide 400 (240 Mg) MG tablet Commonly known as: MAG-OX Take 400 mg by mouth daily.   methocarbamol  500 MG tablet Commonly known as: ROBAXIN  Take 1 tablet (500 mg total) by mouth every 8 (eight) hours as needed for muscle spasms.   metoprolol  tartrate 25 MG tablet Commonly known as: LOPRESSOR  Take 0.5 tablets (12.5 mg total) by mouth 2 (two) times daily.   oxyCODONE  5 MG immediate release tablet Commonly known as: Oxy IR/ROXICODONE  Take 0.5-1 tablets (2.5-5 mg total) by mouth every 6 (six) hours as needed for moderate pain (pain score 4-6) or severe pain (pain score 7-10). What changed:  how much to take when to take this reasons to take this   pantoprazole  40 MG tablet Commonly  known as: PROTONIX  Take 40 mg by mouth daily before breakfast.   polyethylene glycol powder 17 GM/SCOOP powder Commonly known as: GLYCOLAX /MIRALAX  Take 17 g by mouth daily as needed (to aid the bowels- mix into 4 to 6 ounces of fluid).   senna 8.6 MG Tabs tablet Commonly known as: SENOKOT Take 1 tablet by mouth daily as needed for mild constipation.   Voltaren 1 % Gel Generic drug: diclofenac Sodium Apply 2 g topically 2 (two) times daily.        Contact information for follow-up providers     Haddix, Florentina Huntsman, MD. Schedule an appointment as soon as possible for a visit in 2 week(s).   Specialty: Orthopedic Surgery Why: for wound check and repeat x-rays Contact information: [address] [place] [address] [phone]         Place, [facility] .   Specialty: Skilled Nursing  Facility Contact information: [address] [place] [address] [phone]              Contact information for after-discharge care     Destination     [facility], [facility] Preferred SNF .   Service: Skilled Nursing Contact information: [address] [place]  [phone]                    Allergies  Allergen Reactions   Codeine Other (See Comments)    Migraines     Consultations: Orthopedics, Dr. Curtiss Dowdy   Procedures/Studies: DG FEMUR PORT MIN 2 VIEWS LEFT Result Date: 09/18/2023 CLINICAL DATA:  Fracture, postop EXAM: LEFT FEMUR PORTABLE 2 VIEWS COMPARISON:  Preoperative imaging FINDINGS: Lateral plate and screw fixation of distal femur fracture. Stable fracture alignment. Recent postsurgical change includes air and edema in the soft tissues. Previous hip arthroplasty. IMPRESSION: ORIF of distal femur fracture without immediate postoperative complication. Electronically Signed   By: Chadwick Colonel M.D.   On: 09/18/2023 16:10   DG FEMUR MIN 2 VIEWS LEFT Result Date: 09/18/2023 CLINICAL DATA:  Elective surgery EXAM: LEFT FEMUR 2 VIEWS COMPARISON:  Preoperative imaging FINDINGS: Five fluoroscopic spot views of the left femur submitted from the operating room. Lateral plate and screw fixation of distal femur fracture. Fluoroscopy time 29 seconds. Dose 1.72 mGy. IMPRESSION: Intraoperative fluoroscopy during distal femur fracture fixation. Electronically Signed   By: Chadwick Colonel M.D.   On: 09/18/2023 16:09   DG C-Arm 1-60 Min-No Report Result Date: 09/18/2023 Fluoroscopy was utilized by the requesting physician.  No radiographic interpretation.   ECHOCARDIOGRAM COMPLETE Result Date: 09/17/2023    ECHOCARDIOGRAM REPORT   Patient Name:   Rose Bridges Date of Exam: 09/17/2023 Medical Rec #:  [id number]                  Height:       64.0 in Accession #:    [id number]                 Weight:        125.7 lb Date of Birth:  04-25-1961                  BSA:          1.606 m Patient Age:    63 years                   BP:           114/87 mmHg Patient Gender: F  HR:           112 bpm. Exam Location:  Inpatient Procedure: 2D Echo, Cardiac Doppler and Color Doppler (Both Spectral and Color            Flow Doppler were utilized during procedure). Indications:    Dyspnea R06.00  History:        Patient has prior history of Echocardiogram examinations, most                 recent 11/10/2016. Arrythmias:Tachycardia; Risk Factors:Current                 Smoker.  Sonographer:    Terrilee Few RCS Referring Phys: PATEL, PRANAV, M  Sonographer Comments: Image acquisition challenging due to patient behavioral factors. IMPRESSIONS  1. Left ventricular ejection fraction, by estimation, is 60 to 65%. The left ventricle has normal function. The left ventricle has no regional wall motion abnormalities. Left ventricular diastolic parameters are consistent with Grade I diastolic dysfunction (impaired relaxation).  2. Right ventricular systolic function is normal. The right ventricular size is normal.  3. The mitral valve is normal in structure. No evidence of mitral valve regurgitation. No evidence of mitral stenosis.  4. The aortic valve is normal in structure. Aortic valve regurgitation is trivial. No aortic stenosis is present. Comparison(s): Prior images reviewed side by side. Diastolic LV parameters are slightly worse, but left heart filling pressures remain normal. FINDINGS  Left Ventricle: Left ventricular ejection fraction, by estimation, is 60 to 65%. The left ventricle has normal function. The left ventricle has no regional wall motion abnormalities. The left ventricular internal cavity size was normal in size. There is  no left ventricular hypertrophy. Left ventricular diastolic parameters are consistent with Grade I diastolic dysfunction (impaired relaxation). Normal left ventricular filling  pressure. Right Ventricle: The right ventricular size is normal. No increase in right ventricular wall thickness. Right ventricular systolic function is normal. Left Atrium: Left atrial size was normal in size. Right Atrium: Right atrial size was normal in size. Pericardium: Trivial pericardial effusion is present. Presence of epicardial fat layer. Mitral Valve: The mitral valve is normal in structure. No evidence of mitral valve regurgitation. No evidence of mitral valve stenosis. Tricuspid Valve: The tricuspid valve is normal in structure. Tricuspid valve regurgitation is trivial. Aortic Valve: The aortic valve is normal in structure. Aortic valve regurgitation is trivial. No aortic stenosis is present. Aortic valve peak gradient measures 5.4 mmHg. Pulmonic Valve: The pulmonic valve was not well visualized. Aorta: The aortic root and ascending aorta are structurally normal, with no evidence of dilitation. IAS/Shunts: The interatrial septum was not well visualized.  LEFT VENTRICLE PLAX 2D LVIDd:         3.30 cm   Diastology LVIDs:         2.50 cm   LV e' medial:    5.98 cm/s LV PW:         0.70 cm   LV E/e' medial:  8.0 LV IVS:        0.70 cm   LV e' lateral:   6.85 cm/s LVOT diam:     1.90 cm   LV E/e' lateral: 7.0 LV SV:         27 LV SV Index:   17 LVOT Area:     2.84 cm  RIGHT VENTRICLE             IVC RV S prime:     13.80 cm/s  IVC diam: 1.00  cm TAPSE (M-mode): 1.6 cm LEFT ATRIUM         Index LA diam:    1.80 cm 1.12 cm/m  AORTIC VALVE AV Area (Vmax): 1.72 cm AV Vmax:        116.00 cm/s AV Peak Grad:   5.4 mmHg LVOT Vmax:      70.20 cm/s LVOT Vmean:     46.100 cm/s LVOT VTI:       0.094 m  AORTA Ao Root diam: 2.60 cm Ao Asc diam:  2.70 cm MITRAL VALVE               TRICUSPID VALVE MV Area (PHT): 6.65 cm    TR Peak grad:   20.6 mmHg MV Decel Time: 114 msec    TR Vmax:        227.00 cm/s MV E velocity: 47.70 cm/s MV A velocity: 68.70 cm/s  SHUNTS MV E/A ratio:  0.69        Systemic VTI:  0.09 m                             Systemic Diam: 1.90 cm Karyl Paget Croitoru MD Electronically signed by Luana Rumple MD Signature Date/Time: 09/17/2023/5:47:21 PM    Final    DG Cerv Spine Flex&Ext Only Result Date: 09/17/2023 CLINICAL DATA:  63 year old female with neck pain EXAM: CERVICAL SPINE - FLEXION AND EXTENSION VIEWS ONLY COMPARISON:  None Available. FINDINGS: Cervical Spine: Flexion and extension images of the cervical spine demonstrate vertebral bodies aligned with no pathologic motion. No subluxation, anterolisthesis, retrolisthesis. No acute fracture line identified. Vertebral body heights maintained. Early endplate changes spanning C4-C7 with anterior osteophyte formation. No significant facet disease. Prevertebral soft tissues within normal limits. IMPRESSION: Negative for acute fracture. Negative for pathologic motion Electronically Signed   By: Myrlene Asper D.O.   On: 09/17/2023 14:04   DG FEMUR MIN 2 VIEWS LEFT Result Date: 09/17/2023 CLINICAL DATA:  Fracture.  Distal femur fracture after fall. EXAM: LEFT FEMUR 2 VIEWS COMPARISON:  Knee radiographs yesterday FINDINGS: Oblique distal femur fracture with decreased displacement from yesterday's exam. No knee joint effusion to suggest intra-articular involvement. The bones are diffusely under mineralized. Hip arthroplasty is intact were visualized. Vascular calcifications are seen. IMPRESSION: Oblique distal femur fracture with decreased displacement from yesterday's exam. Electronically Signed   By: Chadwick Colonel M.D.   On: 09/17/2023 12:53   CT KNEE LEFT WO CONTRAST Result Date: 09/17/2023 CLINICAL DATA:  Left femur fracture. EXAM: CT OF THE LEFT KNEE WITHOUT CONTRAST TECHNIQUE: Multidetector CT imaging of the left knee was performed according to the standard protocol. Multiplanar CT image reconstructions were also generated. RADIATION DOSE REDUCTION: This exam was performed according to the departmental dose-optimization program which includes automated  exposure control, adjustment of the mA and/or kV according to patient size and/or use of iterative reconstruction technique. COMPARISON:  Knee radiographs 09/16/2023 FINDINGS: Bones/Joint/Cartilage Longitudinally extending fracture extending from the medial metadiaphysis distally. Distal margin of the fracture is indistinct but this probably extends into the medial portion of the femoral trochlear groove and intercondylar notch. Accordingly this is likely an OTA 33-B2 fracture pattern. Small hemarthrosis. Bony demineralization. Medial and lateral compartmental articular space narrowing. Ligaments Suboptimally assessed by CT. Muscles and Tendons Regional muscular atrophy. Soft tissues Subcutaneous edema posteromedially along the distal thigh and both posteromedially and posterolaterally along the proximal calf. Atherosclerosis. IMPRESSION: 1. Longitudinally extending fracture extending from the medial metadiaphysis distally. Distal  margin of the fracture is indistinct but this probably extends into the medial portion of the femoral trochlear groove and intercondylar notch. Accordingly this is likely an OTA 33-B2 fracture pattern. 2. Small hemarthrosis. 3. Bony demineralization. 4. Medial and lateral compartmental articular space narrowing. 5. Regional muscular atrophy. 6. Subcutaneous edema posteromedially along the distal thigh and both posteromedially and posterolaterally along the proximal calf. 7. Atherosclerosis. Electronically Signed   By: Freida Jes M.D.   On: 09/17/2023 12:08   DG HIP UNILAT WITH PELVIS 2-3 VIEWS LEFT Result Date: 09/16/2023 CLINICAL DATA:  Fall, pain EXAM: DG HIP (WITH OR WITHOUT PELVIS) 2-3V LEFT COMPARISON:  None Available. FINDINGS: Diffuse osteopenia. Prior left hip replacement. No hardware complicating feature. No acute bony abnormality. Specifically, no fracture, subluxation, or dislocation. IMPRESSION: Osteopenia.  No acute bony abnormality. Electronically Signed   By:  Janeece Mechanic M.D.   On: 09/16/2023 21:37   DG Chest 1 View Result Date: 09/16/2023 CLINICAL DATA:  Fall, pain EXAM: CHEST  1 VIEW COMPARISON:  05/17/2023 FINDINGS: Heart and mediastinal contours are within normal limits. No focal opacities or effusions. No acute bony abnormality. Calcifications noted in the breasts bilaterally. IMPRESSION: No active disease. Electronically Signed   By: Janeece Mechanic M.D.   On: 09/16/2023 21:37   DG Knee 1-2 Views Left Result Date: 09/16/2023 CLINICAL DATA:  Pain EXAM: LEFT KNEE - 1-2 VIEW COMPARISON:  None Available. FINDINGS: The bones are diffusely osteopenic. There is an acute oblique fracture through the distal femoral diaphysis without significant displacement or angulation. Joint spaces are maintained and alignment is anatomic. Soft tissues are within normal limits. IMPRESSION: Acute oblique fracture through the distal femoral diaphysis. Electronically Signed   By: Tyron Gallon M.D.   On: 09/16/2023 21:36   DG HIP UNILAT WITH PELVIS 2-3 VIEWS RIGHT Result Date: 09/16/2023 CLINICAL DATA:  Fall, pain EXAM: DG HIP (WITH OR WITHOUT PELVIS) 2-3V RIGHT COMPARISON:  None Available. FINDINGS: There is no evidence of hip fracture or dislocation. There is no evidence of arthropathy or other focal bone abnormality. IMPRESSION: Negative. Electronically Signed   By: Janeece Mechanic M.D.   On: 09/16/2023 21:35   DG Foot 2 Views Left Result Date: 09/16/2023 CLINICAL DATA:  Fall, pain EXAM: LEFT FOOT - 2 VIEW COMPARISON:  None Available. FINDINGS: Diffuse osteopenia. No acute bony abnormality. Specifically, no fracture, subluxation, or dislocation. IMPRESSION: Osteopenia.  No acute bony abnormality. Electronically Signed   By: Janeece Mechanic M.D.   On: 09/16/2023 21:35   DG Foot 2 Views Right Result Date: 09/16/2023 CLINICAL DATA:  Fall, pain EXAM: RIGHT FOOT - 2 VIEW COMPARISON:  None Available. FINDINGS: Osteopenia. No acute bony abnormality. Specifically, no fracture,  subluxation, or dislocation. IMPRESSION: Osteopenia.  No acute bony abnormality. Electronically Signed   By: Janeece Mechanic M.D.   On: 09/16/2023 21:34   DG Ankle 2 Views Right Result Date: 09/16/2023 CLINICAL DATA:  Fall, pain EXAM: RIGHT ANKLE - 2 VIEW COMPARISON:  None Available. FINDINGS: Diffuse osteopenia. No acute bony abnormality. Specifically, no fracture, subluxation, or dislocation. IMPRESSION: Osteopenia.  No acute bony abnormality. Electronically Signed   By: Janeece Mechanic M.D.   On: 09/16/2023 21:34   DG Ankle 2 Views Left Result Date: 09/16/2023 CLINICAL DATA:  Fall, pain EXAM: LEFT ANKLE - 2 VIEW COMPARISON:  None Available. FINDINGS: Diffuse osteopenia. No acute bony abnormality. Specifically, no fracture, subluxation, or dislocation. IMPRESSION: No acute bony abnormality.  Osteopenia Electronically Signed   By: Ernestina Headland  Dover M.D.   On: 09/16/2023 21:33   DG Knee 1-2 Views Right Result Date: 09/16/2023 CLINICAL DATA:  Fall, pain EXAM: RIGHT KNEE - 1-2 VIEW COMPARISON:  None Available. FINDINGS: Diffuse osteopenia. No acute bony abnormality. Specifically, no fracture, subluxation, or dislocation. No joint effusion. IMPRESSION: Osteopenia.  No acute bony abnormality. Electronically Signed   By: Janeece Mechanic M.D.   On: 09/16/2023 21:33   CT Head Wo Contrast Result Date: 09/16/2023 CLINICAL DATA:  Head and neck trauma EXAM: CT HEAD WITHOUT CONTRAST CT CERVICAL SPINE WITHOUT CONTRAST TECHNIQUE: Multidetector CT imaging of the head and cervical spine was performed following the standard protocol without intravenous contrast. Multiplanar CT image reconstructions of the cervical spine were also generated. RADIATION DOSE REDUCTION: This exam was performed according to the departmental dose-optimization program which includes automated exposure control, adjustment of the mA and/or kV according to patient size and/or use of iterative reconstruction technique. COMPARISON:  CT brain 09/01/2023,  05/28/2023, CT cervical spine 04/19/2018 FINDINGS: CT HEAD FINDINGS Brain: No acute territorial infarction, hemorrhage or intracranial mass. Moderate atrophy.moderate chronic small vessel ischemic changes of the white matter. Multiple chronic appearing lacunar infarcts within the bilateral basal ganglia and thalami. Stable ventricle size. Vascular: No hyperdense vessels.  Carotid vascular calcification Skull: No fracture Sinuses/Orbits: Mucosal thickening in the sinuses Other: None CT CERVICAL SPINE FINDINGS Alignment: No subluxation.  Facet alignment is within normal limits. Skull base and vertebrae: No acute fracture. No primary bone lesion or focal pathologic process. Soft tissues and spinal canal: No prevertebral fluid or swelling. No visible canal hematoma. Disc levels:  Mild degenerative changes C4-C5, C5-C6 and C6-C7 Upper chest: Negative. Other: None IMPRESSION: 1. Limited by habitus and positioning. No definite CT evidence for acute intracranial abnormality. Atrophy and chronic small vessel ischemic changes of the white matter. Multiple chronic appearing lacunar infarcts within the bilateral basal ganglia and thalami. 2. Degenerative changes of the cervical spine. No acute osseous abnormality. Electronically Signed   By: Esmeralda Hedge M.D.   On: 09/16/2023 19:42   CT Cervical Spine Wo Contrast Result Date: 09/16/2023 CLINICAL DATA:  Head and neck trauma EXAM: CT HEAD WITHOUT CONTRAST CT CERVICAL SPINE WITHOUT CONTRAST TECHNIQUE: Multidetector CT imaging of the head and cervical spine was performed following the standard protocol without intravenous contrast. Multiplanar CT image reconstructions of the cervical spine were also generated. RADIATION DOSE REDUCTION: This exam was performed according to the departmental dose-optimization program which includes automated exposure control, adjustment of the mA and/or kV according to patient size and/or use of iterative reconstruction technique. COMPARISON:  CT  brain 09/01/2023, 05/28/2023, CT cervical spine 04/19/2018 FINDINGS: CT HEAD FINDINGS Brain: No acute territorial infarction, hemorrhage or intracranial mass. Moderate atrophy.moderate chronic small vessel ischemic changes of the white matter. Multiple chronic appearing lacunar infarcts within the bilateral basal ganglia and thalami. Stable ventricle size. Vascular: No hyperdense vessels.  Carotid vascular calcification Skull: No fracture Sinuses/Orbits: Mucosal thickening in the sinuses Other: None CT CERVICAL SPINE FINDINGS Alignment: No subluxation.  Facet alignment is within normal limits. Skull base and vertebrae: No acute fracture. No primary bone lesion or focal pathologic process. Soft tissues and spinal canal: No prevertebral fluid or swelling. No visible canal hematoma. Disc levels:  Mild degenerative changes C4-C5, C5-C6 and C6-C7 Upper chest: Negative. Other: None IMPRESSION: 1. Limited by habitus and positioning. No definite CT evidence for acute intracranial abnormality. Atrophy and chronic small vessel ischemic changes of the white matter. Multiple chronic appearing lacunar infarcts within the  bilateral basal ganglia and thalami. 2. Degenerative changes of the cervical spine. No acute osseous abnormality. Electronically Signed   By: Esmeralda Hedge M.D.   On: 09/16/2023 19:42   CT Angio Chest PE W and/or Wo Contrast Result Date: 09/01/2023 CLINICAL DATA:  Concern for pulmonary embolism. Also abdominal pain. EXAM: CT ANGIOGRAPHY CHEST CT ABDOMEN AND PELVIS WITH CONTRAST TECHNIQUE: Multidetector CT imaging of the chest was performed using the standard protocol during bolus administration of intravenous contrast. Multiplanar CT image reconstructions and MIPs were obtained to evaluate the vascular anatomy. Multidetector CT imaging of the abdomen and pelvis was performed using the standard protocol during bolus administration of intravenous contrast. RADIATION DOSE REDUCTION: This exam was performed  according to the departmental dose-optimization program which includes automated exposure control, adjustment of the mA and/or kV according to patient size and/or use of iterative reconstruction technique. CONTRAST:  OMNIPAQUE  IOHEXOL  350 MG/ML SOLN COMPARISON:  Chest radiograph dated 05/17/2023. FINDINGS: Evaluation of this exam is limited due to respiratory motion. CTA CHEST FINDINGS Cardiovascular: There is no cardiomegaly. Trace pericardial effusion. Three-vessel coronary vascular calcification. The thoracic aorta is unremarkable. The origins of the great vessels of the aortic arch are patent. No pulmonary artery embolus identified. Mediastinum/Nodes: No hilar or mediastinal adenopathy. The esophagus is grossly unremarkable no mediastinal fluid collection. No mediastinal fluid collection. Lungs/Pleura: Bilateral lower lobe linear atelectasis/scarring. No focal consolidation, pleural effusion, or pneumothorax. The central airways are patent. Musculoskeletal: Osteopenia.  No acute osseous pathology. Review of the MIP images confirms the above findings. CT ABDOMEN and PELVIS FINDINGS No intra-abdominal free air or free fluid. Hepatobiliary: The liver is unremarkable. Mild dilatation. No calcified gallstone. Focal adenomyomatosis of the gallbladder fundus. Pancreas: Unremarkable. No pancreatic ductal dilatation or surrounding inflammatory changes. Spleen: Normal in size without focal abnormality. Adrenals/Urinary Tract: The adrenal glands unremarkable. There is no hydronephrosis on either side. The visualized ureters and urinary bladder appear unremarkable. Stomach/Bowel: There is normal obstruction. The appendix is normal. Several duodenal diverticula measure up to 4 cm. Vascular/Lymphatic: Mild aortoiliac atherosclerotic disease. The IVC is unremarkable no portal gas. There is no adenopathy. Reproductive: Hysterectomy. Other: None Musculoskeletal: Left hip arthroplasty with associated streak artifact.  Osteopenia with degenerative changes of the spine. No acute osseous pathology. Review of the MIP images confirms the above findings. IMPRESSION: 1. No acute intrathoracic, abdominal, or pelvic pathology. No CT evidence of pulmonary artery embolus. 2. Three-vessel coronary vascular calcification. 3.  Aortic Atherosclerosis (ICD10-I70.0). Electronically Signed   By: Angus Bark M.D.   On: 09/01/2023 20:49   CT ABDOMEN PELVIS W CONTRAST Result Date: 09/01/2023 CLINICAL DATA:  Concern for pulmonary embolism. Also abdominal pain. EXAM: CT ANGIOGRAPHY CHEST CT ABDOMEN AND PELVIS WITH CONTRAST TECHNIQUE: Multidetector CT imaging of the chest was performed using the standard protocol during bolus administration of intravenous contrast. Multiplanar CT image reconstructions and MIPs were obtained to evaluate the vascular anatomy. Multidetector CT imaging of the abdomen and pelvis was performed using the standard protocol during bolus administration of intravenous contrast. RADIATION DOSE REDUCTION: This exam was performed according to the departmental dose-optimization program which includes automated exposure control, adjustment of the mA and/or kV according to patient size and/or use of iterative reconstruction technique. CONTRAST:  OMNIPAQUE  IOHEXOL  350 MG/ML SOLN COMPARISON:  Chest radiograph dated 05/17/2023. FINDINGS: Evaluation of this exam is limited due to respiratory motion. CTA CHEST FINDINGS Cardiovascular: There is no cardiomegaly. Trace pericardial effusion. Three-vessel coronary vascular calcification. The thoracic aorta is unremarkable. The origins  of the great vessels of the aortic arch are patent. No pulmonary artery embolus identified. Mediastinum/Nodes: No hilar or mediastinal adenopathy. The esophagus is grossly unremarkable no mediastinal fluid collection. No mediastinal fluid collection. Lungs/Pleura: Bilateral lower lobe linear atelectasis/scarring. No focal consolidation, pleural  effusion, or pneumothorax. The central airways are patent. Musculoskeletal: Osteopenia.  No acute osseous pathology. Review of the MIP images confirms the above findings. CT ABDOMEN and PELVIS FINDINGS No intra-abdominal free air or free fluid. Hepatobiliary: The liver is unremarkable. Mild dilatation. No calcified gallstone. Focal adenomyomatosis of the gallbladder fundus. Pancreas: Unremarkable. No pancreatic ductal dilatation or surrounding inflammatory changes. Spleen: Normal in size without focal abnormality. Adrenals/Urinary Tract: The adrenal glands unremarkable. There is no hydronephrosis on either side. The visualized ureters and urinary bladder appear unremarkable. Stomach/Bowel: There is normal obstruction. The appendix is normal. Several duodenal diverticula measure up to 4 cm. Vascular/Lymphatic: Mild aortoiliac atherosclerotic disease. The IVC is unremarkable no portal gas. There is no adenopathy. Reproductive: Hysterectomy. Other: None Musculoskeletal: Left hip arthroplasty with associated streak artifact. Osteopenia with degenerative changes of the spine. No acute osseous pathology. Review of the MIP images confirms the above findings. IMPRESSION: 1. No acute intrathoracic, abdominal, or pelvic pathology. No CT evidence of pulmonary artery embolus. 2. Three-vessel coronary vascular calcification. 3.  Aortic Atherosclerosis (ICD10-I70.0). Electronically Signed   By: Angus Bark M.D.   On: 09/01/2023 20:49   CT HEAD WO CONTRAST ( ) Result Date: 09/01/2023 CLINICAL DATA:  Altered mental status EXAM: CT HEAD WITHOUT CONTRAST TECHNIQUE: Contiguous axial images were obtained from the base of the skull through the vertex without intravenous contrast. RADIATION DOSE REDUCTION: This exam was performed according to the departmental dose-optimization program which includes automated exposure control, adjustment of the mA and/or kV according to patient size and/or use of iterative reconstruction  technique. COMPARISON:  05/28/2023 FINDINGS: Brain: No evidence of acute infarction, hemorrhage, hydrocephalus, extra-axial collection or mass lesion/mass effect. Mild chronic white matter ischemic changes are again noted. 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 Vascular: No hyperdense vessel or unexpected calcification. Skull: Normal. Negative for fracture or focal lesion. Sinuses/Orbits: No acute finding. Other: None. IMPRESSION: Chronic changes without acute abnormality. Electronically Signed   By: Violeta Grey M.D.   On: 09/01/2023 20:39     Subjective: Patient seen examined bedside, lying in bed.  No complaints this morning other than mild discomfort to her operative site.  Discharging back to SNF today.  Denies headache, no chest pain, no shortness of breath, no abdominal pain, no fever.  No acute events overnight per nursing staff.  Discharge Exam: Vitals:   09/20/23 0449 09/20/23 0822  BP: 127/76 120/81  Pulse: 73 77  Resp: 15 16  Temp: 97.7 F (36.5 C) 97.7 F (36.5 C)  SpO2: 99% 100%   Vitals:   09/20/23 0152 09/20/23 0156 09/20/23 0449 09/20/23 0822  BP: (!) 83/61 96/73 127/76 120/81  Pulse: 70 73 73 77  Resp:   15 16  Temp:   97.7 F (36.5 C) 97.7 F (36.5 C)  TempSrc:    Oral  SpO2:   99% 100%  Weight:      Height:        Physical Exam: GEN: NAD, alert, pleasantly confused, elderly appearance HEENT: NCAT, PERRL, EOMI, sclera clear, MMM PULM: CTAB w/o wheezes/crackles, normal respiratory effort, on room air CV: Tachycardic, regular rhythm w/o M/G/R  GI: abd soft, NTND, + BS MSK: no peripheral edema, moves all extremities independently, left lower extremity surgical dressing in place, clean/dry/intact with no surrounding erythema/fluctuance or drainage NEURO: No focal neurological deficits, sensation to light touch intact PSYCH: normal  mood/affect Integumentary: Left lower extremity surgical incision site/dressing as above, otherwise no concerning rashes/lesions/wounds none exposed skin surfaces    The results of significant diagnostics from this hospitalization (including imaging, microbiology, ancillary and laboratory) are listed below for reference.     Microbiology: Recent Results (from the past 240 hours)  Surgical pcr screen     Status: None   Collection Time: 09/17/23  7:21 AM   Specimen: Nasal Mucosa; Nasal Swab  Result Value Ref Range Status   MRSA, PCR NEGATIVE NEGATIVE Final   Staphylococcus aureus NEGATIVE NEGATIVE Final    Comment: (NOTE) The Xpert SA Assay (FDA approved for NASAL specimens in patients 25 years of age and older), is one component of a comprehensive surveillance program. It is not intended to diagnose infection nor to guide or monitor treatment. Performed at [facility], [address], [place], [place] [address]      Labs: BNP (last 3 results) No results for input(s): "BNP" in the last 8760 hours. Basic Metabolic Panel: Recent Labs  Lab 09/16/23 2052 09/17/23 1311 09/18/23 0653 09/19/23 0551  NA 141 142 140 138  K 3.4* 4.1 4.7 4.4  CL 109 106 110 106  CO2 27 24 23  21*  GLUCOSE 126* 91 132* 132*  BUN 13 13 10 9   CREATININE 0.48 0.44 0.35* 0.40*  CALCIUM  7.9* 8.4* 7.6* 7.8*  MG  --   --  1.5* 2.1   Liver Function Tests: Recent Labs  Lab 09/16/23 2052 09/17/23 1311  AST 30 34  ALT 35 38  ALKPHOS 76 89  BILITOT 0.3 0.8  PROT 4.4* 5.4*  ALBUMIN 1.8* 2.3*   No results for input(s): "LIPASE", "AMYLASE" in the last 168 hours. No results for input(s): "AMMONIA" in the last 168 hours. CBC: Recent Labs  Lab 09/16/23 1815 09/17/23 1311 09/18/23 0653 09/19/23 0551 09/20/23 0814  WBC 14.2* 9.0 9.0 13.6* 10.9*  NEUTROABS 10.5*  --   --   --   --   HGB 15.5* 16.4* 13.5 12.6 11.6*  HCT 48.3* 49.5* 40.9 38.5 35.6*  MCV 111.5* 109.3* 109.7* 109.7* 111.9*   PLT 239 243 216 219 171   Cardiac Enzymes: No results for input(s): "CKTOTAL", "CKMB", "CKMBINDEX", "TROPONINI" in the last 168 hours. BNP: Invalid input(s): "POCBNP" CBG: Recent Labs  Lab 09/18/23 0637 09/18/23 1157 09/18/23 1738 09/19/23 1842 09/20/23 0020  GLUCAP 123* 120* 112* 106* 97   D-Dimer No results for input(s): "DDIMER" in the last 72 hours. Hgb A1c No results for input(s): "HGBA1C" in the last 72 hours. Lipid Profile No results for input(s): "CHOL", "HDL", "LDLCALC", "TRIG", "CHOLHDL", "LDLDIRECT" in the last 72 hours. Thyroid  function studies Recent Labs    09/17/23 1322  TSH 0.878   Anemia work up Recent Labs    09/17/23 1322  VITAMINB12 1,708*  FOLATE 13.4   Urinalysis    Component Value Date/Time   COLORURINE YELLOW 09/16/2023 1803   APPEARANCEUR CLEAR 09/16/2023 1803   LABSPEC 1.030 09/16/2023 1803   PHURINE 5.0 09/16/2023 1803   GLUCOSEU NEGATIVE 09/16/2023 1803   HGBUR NEGATIVE 09/16/2023 1803   BILIRUBINUR NEGATIVE 09/16/2023 1803   KETONESUR NEGATIVE 09/16/2023 1803   PROTEINUR NEGATIVE 09/16/2023 1803   NITRITE NEGATIVE 09/16/2023 1803  LEUKOCYTESUR NEGATIVE 09/16/2023 1803   Sepsis Labs Recent Labs  Lab 09/17/23 1311 09/18/23 0653 09/19/23 0551 09/20/23 0814  WBC 9.0 9.0 13.6* 10.9*   Microbiology Recent Results (from the past 240 hours)  Surgical pcr screen     Status: None   Collection Time: 09/17/23  7:21 AM   Specimen: Nasal Mucosa; Nasal Swab  Result Value Ref Range Status   MRSA, PCR NEGATIVE NEGATIVE Final   Staphylococcus aureus NEGATIVE NEGATIVE Final    Comment: (NOTE) The Xpert SA Assay (FDA approved for NASAL specimens in patients 90 years of age and older), is one component of a comprehensive surveillance program. It is not intended to diagnose infection nor to guide or monitor treatment. Performed at [facility], [address], [place], [place] [address]      Time coordinating discharge:  Over 30 minutes  SIGNED:   Rema Care [place], DO  [facility] 09/20/2023, 10:40 AM

## 2023-09-20 NOTE — Progress Notes (Signed)
 AVS completed for discharge packet.

## 2023-09-20 NOTE — ED Triage Notes (Signed)
 [name] from [facility]. Pt just discharged today and upon being transported back to facility, pt states "I refuse to be here. I will not stay at this facility". Pt A/Ox4. Facility administrator told [name] that pt is unable to stay at [facility] and that she has to come back to the ER for new placement.  VSS.

## 2023-09-20 NOTE — TOC CM/SW Note (Signed)
 SW met with patient at bedside regarding patient returning back to ED from [facility]. Patient stated she did not want to return to [facility] because she does not like the place, the food, and wants to go back home living with his brother. Patient fractured her femur, she got surgery and was discharge to SNF side at [facility].   SW reached out to daughter Riane Rung [phone] to update her about her mother returning back to ED after refusing to return back to [facility]. [name] expressed being confused why she was not called by facility or hospital to inform her that her mother returned. SW explained what occurred that lead patient back to ED, [name] [phone] ) stated the patient lives at [facility] term, not with her brother, she has been at the facility for a year in a half. As well the patient has not been able to walk for about a year or so, she has hx of bipolar d/o, personality disorder and possibly have dementia.  Daughter recently had surgery on her ankle, lives about an hour and half from the hospital, she is also the POA and would like her mother to return to facility. [name] gave SW the number to administrator at [facility], Marcille Severance [phone]) to receive clarity what occurred. He stated when patient arrived back, she was yelling stating "I do not want to be here in this place, I am not staying here, get me out of here". Patient was not redirectable, [name] stated they are not able to remove her from the stretcher if she is refusing and she was returned. Marcille Severance stated this is not the first time this behavior has occurred, she has been Iv'cd 3x within the past year, he has a great relationship with her daughter, she is welcomed to come back to [facility] but he is concerned she does not want to be there anymore. SW explained to Brandon as well her daughter SW's in the ED does not find Long term Care placement's. [name] does not want to find another place for the patient  because it is a nice facility, they take good care of her, and she can get to the facility.   [name] is requesting follow ups from SW over the weekend how the patient is doing and if she will be discharged back to [facility]. She does not want her to lose her room.   SW attempted to follow up with patient, she was resting with her eyes closed in bed.  .Anntoinette Haefele, MSW, LCSWA Transition of Care  Clinical Social Worker (ED 3-11 Mon-Fri)  [phone]

## 2023-09-20 NOTE — Progress Notes (Addendum)
 Orthopaedic Trauma Progress Note  SUBJECTIVE: Resting comfortably in bed this morning.  No acute events overnight.  Unable to provide much additional information this morning.  No family at bedside currently  OBJECTIVE:  Vitals:   09/20/23 0449 09/20/23 0822  BP: 127/76 120/81  Pulse: 73 77  Resp: 15 16  Temp: 97.7 F (36.5 C) 97.7 F (36.5 C)  SpO2: 99% 100%    Opiates Today (MME): Today's  total administered Morphine  Milligram Equivalents: 0 Opiates Yesterday (MME): Yesterday's total administered Morphine  Milligram Equivalents: 9  General: Resting in bed.  No acute distress. Alert. Oriented x 1 Respiratory: No increased work of breathing.  Operative Extremity (left lower extremity): Dressing clean, dry, intact.  Tenderness throughout the thigh as expected.  No significant calf tenderness.  Passive ankle motion stiff but equal to contralateral limb.  Compartments soft and compressible. + DP pulse  IMAGING: Stable post op imaging.   LABS:  Results for orders placed or performed during the hospital encounter of 09/16/23 (from the past 24 hours)  Glucose, capillary     Status: Abnormal   Collection Time: 09/19/23  6:42 PM  Result Value Ref Range   Glucose-Capillary 106 (H) 70 - 99 mg/dL  Glucose, capillary     Status: None   Collection Time: 09/20/23 12:20 AM  Result Value Ref Range   Glucose-Capillary 97 70 - 99 mg/dL  CBC     Status: Abnormal   Collection Time: 09/20/23  8:14 AM  Result Value Ref Range   WBC 10.9 (H) 4.0 - 10.5 K/uL   RBC 3.18 (L) 3.87 - 5.11 MIL/uL   Hemoglobin 11.6 (L) 12.0 - 15.0 g/dL   HCT 16.1 (L) 09.6 - 04.5 %   MCV 111.9 (H) 80.0 - 100.0 fL   MCH 36.5 (H) 26.0 - 34.0 pg   MCHC 32.6 30.0 - 36.0 g/dL   RDW 40.9 81.1 - 91.4 %   Platelets 171 150 - 400 K/uL   nRBC 0.0 0.0 - 0.2 %    ASSESSMENT: Rose Bridges is a 63 y.o. female, 2 Days Post-Op s/p mechanical fall Procedures: OPEN REDUCTION INTERNAL FIXATION LEFT DISTAL FEMUR  FRACTURE  CV/Blood loss: Acute blood loss anemia, Hgb 0.6 this AM. Hemodynamically stable  PLAN: Weightbearing: WBAT LLE ROM: Unrestricted ROM Incisional and dressing care: Daily dressing changes as needed starting today Showering:  ok to begin getting incisions wet 09/21/23 Orthopedic device(s): None  Pain management: Continue current multimodal regimen VTE prophylaxis: Aspirin , SCDs ID:  Ancef  2gm post op completed Foley/Lines:  No foley, KVO IVFs Impediments to Fracture Healing: Vitamin D  level 98, no additional supplementation needed Dispo: PT/OT evaluation ongoing.  Okay for discharge back to SNF from ortho standpoint once cleared by medicine team and therapies.  I have signed and placed discharge Rx for pain medication and DVT prophylaxis in patient's chart  D/C recommendations: - Oxycodone  2.5 mg, Robaxin  for pain control - Aspirin  for DVT prophylaxis - No additional need for Vit D supplementation  Follow - up plan: 2 weeks after d/c for wound check and repeat x-rays   Contact information:  Katheryne Pane MD, Alona Jamaica PA-C. After hours and holidays please check [email or web address] for group call information for [facility]   Edilia Gordon, PA-C [phone] (office) [email or web address]

## 2023-09-20 NOTE — ED Notes (Signed)
 Social work at bedside.

## 2023-09-21 MED ORDER — METOPROLOL TARTRATE 25 MG PO TABS
12.5000 mg | ORAL_TABLET | Freq: Two times a day (BID) | ORAL | Status: DC
  Administered 2023-09-21 – 2023-09-22 (×3): 12.5 mg via ORAL
  Filled 2023-09-21 (×3): qty 1

## 2023-09-21 MED ORDER — GABAPENTIN 300 MG PO CAPS
300.0000 mg | ORAL_CAPSULE | Freq: Three times a day (TID) | ORAL | Status: DC
  Administered 2023-09-21 – 2023-09-23 (×4): 300 mg via ORAL
  Filled 2023-09-21 (×4): qty 1

## 2023-09-21 MED ORDER — LORAZEPAM 0.5 MG PO TABS
0.5000 mg | ORAL_TABLET | Freq: Four times a day (QID) | ORAL | Status: DC | PRN
  Administered 2023-09-21: 0.5 mg via ORAL
  Filled 2023-09-21: qty 1

## 2023-09-21 MED ORDER — ARIPIPRAZOLE 10 MG PO TABS
5.0000 mg | ORAL_TABLET | Freq: Every day | ORAL | Status: DC
  Administered 2023-09-21 – 2023-09-23 (×2): 5 mg via ORAL
  Filled 2023-09-21 (×2): qty 1

## 2023-09-21 MED ORDER — CALCIUM CARBONATE ANTACID 500 MG PO CHEW
1.0000 | CHEWABLE_TABLET | Freq: Two times a day (BID) | ORAL | Status: DC
  Administered 2023-09-21 – 2023-09-22 (×3): 200 mg via ORAL
  Filled 2023-09-21 (×3): qty 1

## 2023-09-21 MED ORDER — DULOXETINE HCL 30 MG PO CPEP
30.0000 mg | ORAL_CAPSULE | Freq: Every day | ORAL | Status: DC
  Administered 2023-09-21 – 2023-09-23 (×2): 30 mg via ORAL
  Filled 2023-09-21 (×2): qty 1

## 2023-09-21 MED ORDER — LORAZEPAM 1 MG PO TABS
1.0000 mg | ORAL_TABLET | Freq: Once | ORAL | Status: AC
  Administered 2023-09-21: 1 mg via ORAL
  Filled 2023-09-21: qty 1

## 2023-09-21 MED ORDER — METHOCARBAMOL 500 MG PO TABS
500.0000 mg | ORAL_TABLET | Freq: Three times a day (TID) | ORAL | Status: DC | PRN
  Administered 2023-09-22: 500 mg via ORAL
  Filled 2023-09-21: qty 1

## 2023-09-21 MED ORDER — PANTOPRAZOLE SODIUM 40 MG PO TBEC
40.0000 mg | DELAYED_RELEASE_TABLET | Freq: Every day | ORAL | Status: DC
  Filled 2023-09-21: qty 1

## 2023-09-21 MED ORDER — DIVALPROEX SODIUM ER 250 MG PO TB24
250.0000 mg | ORAL_TABLET | Freq: Every day | ORAL | Status: DC
  Administered 2023-09-21 – 2023-09-22 (×2): 250 mg via ORAL
  Filled 2023-09-21 (×4): qty 1

## 2023-09-21 NOTE — Progress Notes (Addendum)
 11:57am: CSW spoke with Rose Bridges, Production designer, theatre/television/film at [facility] who states patient needs to be agreeable to return to the facility prior to discharge. Rose Bridges states patient has been under IVC 3 times in the last six months. Rose Bridges suggested patient be placed on a scheduled medication to address her agitation and be seen by psych staff. CSW explained that if patient is not actively suicidal or homicidal that she will likely be psychiatrically cleared. Rose Bridges states the patient is not at her cognitive baseline.Rose Bridges states he does not have any POA paperwork on file and would only be able to accept it if MD makes a statement regarding patient's lack of capacity to refuse to return to the facility.  MD updated on information.  CSW spoke with Rose Bridges at [facility] who states there are no LTC beds available.  CSW sent message to admissions at [facility] to determine if patient can be accepted there - waiting on response.  11:30am: CSW received call from patient's daughter - updates were provided based on CSW and MD conversations with patient. Rose Bridges states she will arrive to the hospital around 4pm today to visit with patient. Rose Bridges agreeable to follow behind EMS to transport patient back to [place]. Rose Bridges requesting a PRN medication be given prior to transport - MD aware.  CSW sent message to admissions at [facility] to determine what the latest time patient can admit to the facility - waiting on response.  11am: CSW spoke with MD to inform her of information.  CSW spoke with admissions at [facility] who states patient was readmitted to the facility yesterday under her Medicaid due to patient not participating with therapy during the 4 day hospitalization.   CSW sent message to patient's daughter requesting a return call.  10:50am: CSW spoke with patient at bedside to discuss discharge. Patient states she refuses to go back to [place] and wants go to go live with her brother Rose Bridges. CSW already discussed this option  with patient's daughter who states it is not an option. Patient became angry with CSW and did not want to discuss discharge any further. Patient states her daughter is coming to visit this afternoon. Patient became tearful, told CSW to leave and said "get out bitch!"   CSW exited the room and spoke with RN regarding options.  CSW spoke with Sherrlyn Dolores of [facility] who confirms a referral was received for outpatient palliative services.   10:30am: CSW received return call from patient's daughter who states she spoke with patient who is refusing to return to [facility]. Rose Bridges states she tried to convince patient to return to the facility but was unsuccessful. Rose Bridges states understanding that the only two options for patient is to return to [facility] or go to the shelter.  9:20am: CSW received call from patient's daughter who states she has not been able to speak with patient or RN yet.  CSW message RN to request she assist patient in contacting her daughter.  8:05am: CSW spoke with patient's daughter Rose Bridges who states patient is LTC at [facility]. Rose Bridges states she wants to talk to patient regarding her return to [facility]. Rose Bridges states she does not want patient placed elsewhere. Rose Bridges states she is agreeable for patient to receive some sort of PRN medication to help with agitation / refusal of discharge plan.  CSW sent secure chat to RN to request patient call her daughter for discussion regarding her return to [place].  Shepard Dicker, MSW, LCSW Transitions of Care  Clinical Social Worker II [phone]

## 2023-09-21 NOTE — ED Provider Notes (Signed)
 Emergency Medicine Observation Re-evaluation Note  Rose Bridges is a 63 y.o. female, seen on rounds today.  Pt initially presented to the ED for complaints of Placement Currently, the patient is sitting in bed, seems to be agitated.  Physical Exam  BP 112/78 (BP Location: Left Arm)   Pulse (!) 105   Temp 97.9 F (36.6 C) (Oral)   Resp 17   Ht 5\' 4"  (1.626 m)   Wt 57 kg   SpO2 97%   BMI 21.57 kg/m  Physical Exam General: Agitated Cardiac: Mildly tachycardic Lungs: No increased work of breathing Psych: Awake and alert  ED Course / MDM  EKG:   I have reviewed the labs performed to date as well as medications administered while in observation.  Recent changes in the last 24 hours include none.  Plan  Current plan is for placement.  Patient was recently in the hospital for distal femur fracture status post operative repair.  On transport to [facility], she refused to go and see that she wanted to go to another nursing home and was brought back here.  Her disposition remains unclear.  She is refusing to go back to [facility] but per social work, we do not have an option of placing her in another facility due to her Medicare status.  Patient will not talk to me.  She screams "get they will out of here".  Her daughter Rose Bridges is healthcare power of attorney.  It is documented in the chart that she has had some cognitive decline although I cannot really assess her orientation because she refuses to talk to me.  When I try to talk to her, she gets very agitated and her heart rate goes up.  She keeps screaming get out of here.  Rose Bridges is supposed to come up today to talk to her.    Rose Los, MD 09/21/23 1124

## 2023-09-22 NOTE — ED Notes (Signed)
 EDP rounded.  Agreed with allowing pt to wake up on her own vs. Waking her up for assessment or meds

## 2023-09-22 NOTE — Progress Notes (Addendum)
 11am: CSW received additional call from [facility] who states she has spoken with patient's son Arcadio Knuckles (Emily's former spouse) regarding his mom's presence in the ED. [name] states patient and Johnathan's relationship is estranged due to her behavioral and psychiatric issues. [name] states she resides in [place] but is relocating to [place]  in July. [name] states Johnathan lives in [place] and requests his mom be placed closer to him. [name] requested CSW contact [facility] in [place] to make a referral.  CSW attempted to reach staff at [facility] without success - no answer. CSW will follow up.  10:05am: CSW spoke with patient's daughter in law [name] to discuss updates. CSW informed [name] that a TTS evaluation was placed yesterday and NP attempted to see patient but she was asleep and NP did not awaken patient. CSW explained efforts to locate new SNF facility and potential barriers to discharge were discussed. CSW faxed patient's clinicals out to additional facilities for review - still no bed offers available.   Emily sent copies of POA paperwork that will be uploaded into patient's chart.  8:28am: CSW spoke with admissions at [facility] who states patient does have a full 100 days of SNF benefits under her Medicare.  Patient does not have any bed offers at this time.  Shepard Dicker, MSW, LCSW Transitions of Care  Clinical Social Worker II [phone]

## 2023-09-22 NOTE — Progress Notes (Signed)
 [id number] Liaison Note  This is a newly referred/pending patient for outpatient palliative care with [facility].  We will follow for discharge disposition.  Please call with any questions or concerns.  Thank you, Lestine Rathke, BSN, [facility] [phone]

## 2023-09-22 NOTE — ED Provider Notes (Signed)
 Emergency Medicine Observation Re-evaluation Note  Dakota Stangl is a 63 y.o. female, seen on rounds today.  Pt initially presented to the ED for complaints of Placement Currently, the patient is asleep.  Pt presented on 5/23 for placement in another facility as she did not like [facility].  Disposition is still pending. Physical Exam  BP (!) 114/90   Pulse 99   Temp 98.1 F (36.7 C) (Oral)   Resp 18   Ht 5\' 4"  (1.626 m)   Wt 57 kg   SpO2 100%   BMI 21.57 kg/m  Physical Exam General: asleep Cardiac: rr Lungs: clear Psych: asleep  ED Course / MDM  EKG:   I have reviewed the labs performed to date as well as medications administered while in observation.  Recent changes in the last 24 hours include none.  Plan  Current plan is for SNF placement.    Sueellen Emery, MD 09/22/23 [phone]

## 2023-09-22 NOTE — Consult Note (Signed)
 Attempted to see patient for psych assessment via tts cart.  Informed by Arla Lab, RN caring for her that she's asleep and historically has not been pleasant when awakened.  Will allow patient to sleep and he will alert this provider when she is awake.

## 2023-09-23 NOTE — ED Provider Notes (Addendum)
 Emergency Medicine Observation Re-evaluation Note  Elanor Cale is a 63 y.o. female, seen on rounds today.  Pt initially presented to the ED for complaints of Placement Currently, the patient is sleeping.  Physical Exam  BP 123/66   Pulse 78   Temp 98.1 F (36.7 C) (Oral)   Resp 15   Ht 5\' 4"  (1.626 m)   Wt 57 kg   SpO2 98%   BMI 21.57 kg/m  Physical Exam General: nad Cardiac: regular Lungs: clear Psych: intermittently cooperative  ED Course / MDM  EKG:   I have reviewed the labs performed to date as well as medications administered while in observation.  Recent changes in the last 24 hours include pt has continued to refuse to go back to [facility] and currently psych has not been able to evaluate.  Plan  Current plan is for new snf placement.  2:01 PM Pt is not agreeable to go back to [facility].  Pt did have a record of low blood pressure however on subsequent eval BP was improved.  Pt did not have any changes in behavior.    Almond Army, MD 09/23/23 [id number]    Almond Army, MD 09/23/23 [phone]

## 2023-09-23 NOTE — Progress Notes (Addendum)
 12:30pm: Patient can go to room 508P at [facility] via [name] - RN to call [name] and report when ready. The number to call for report is [phone].  RN and MD aware of discharge plan.  11:43am: CSW received message from RN stating patient is agreeable to return to [facility].   CSW spoke with Marcille Severance, Production designer, theatre/television/film at [facility] who is agreeable for patient to return to the facility.  CSW spoke with patient's daughter [name] to inform her of discharge plan - she is agreeable to the plan.  CSW sent message to admissions at [facility] to inform them of information - currently waiting on a response.  8:30am: Patient remains without any bed offers at this time.  Shepard Dicker, MSW, LCSW Transitions of Care  Clinical Social Worker II [phone]

## 2023-09-23 NOTE — ED Notes (Signed)
 Pt was asked if she wanted to go back to [facility] for rehabilitation and pt stated that she "needed to go back." SW notified of pts decision

## 2023-09-23 NOTE — ED Notes (Signed)
 [name] scheduled for the patient to be returned to [facility].  Per dispatch, ETA should be within the hour.  RN made aware of transportation arrangements.

## 2023-09-23 NOTE — Discharge Instructions (Signed)
 Discharge back to [facility] for LTC.

## 2023-09-23 NOTE — ED Notes (Signed)
 Report was given to +Nyeha Theone Fitting, LPN at 1610 from [facility]

## 2023-10-23 ENCOUNTER — Emergency Department (HOSPITAL_COMMUNITY)
Admission: EM | Admit: 2023-10-23 | Discharge: 2023-10-24 | Disposition: A | Discharge status: Skilled Nursing Facility | Attending: Emergency Medicine | Admitting: Emergency Medicine

## 2023-10-23 ENCOUNTER — Encounter (HOSPITAL_COMMUNITY): Payer: Self-pay | Admitting: *Deleted

## 2023-10-23 ENCOUNTER — Emergency Department (HOSPITAL_COMMUNITY)

## 2023-10-23 ENCOUNTER — Other Ambulatory Visit: Payer: Self-pay

## 2023-10-23 DIAGNOSIS — Z8673 Personal history of transient ischemic attack (TIA), and cerebral infarction without residual deficits: Secondary | ICD-10-CM | POA: Diagnosis not present

## 2023-10-23 DIAGNOSIS — M25562 Pain in left knee: Secondary | ICD-10-CM | POA: Insufficient documentation

## 2023-10-23 DIAGNOSIS — M47812 Spondylosis without myelopathy or radiculopathy, cervical region: Secondary | ICD-10-CM | POA: Diagnosis not present

## 2023-10-23 DIAGNOSIS — I6782 Cerebral ischemia: Secondary | ICD-10-CM | POA: Diagnosis not present

## 2023-10-23 DIAGNOSIS — M25551 Pain in right hip: Secondary | ICD-10-CM | POA: Insufficient documentation

## 2023-10-23 DIAGNOSIS — W19XXXA Unspecified fall, initial encounter: Secondary | ICD-10-CM | POA: Insufficient documentation

## 2023-10-23 DIAGNOSIS — G319 Degenerative disease of nervous system, unspecified: Secondary | ICD-10-CM | POA: Insufficient documentation

## 2023-10-23 DIAGNOSIS — R000 Tachycardia, unspecified: Secondary | ICD-10-CM | POA: Diagnosis not present

## 2023-10-23 DIAGNOSIS — M25561 Pain in right knee: Secondary | ICD-10-CM | POA: Insufficient documentation

## 2023-10-23 LAB — CBC
HCT: 40.9 % (ref 36.0–46.0)
Hemoglobin: 13.1 g/dL (ref 12.0–15.0)
MCH: 37.4 pg — ABNORMAL HIGH (ref 26.0–34.0)
MCHC: 32 g/dL (ref 30.0–36.0)
MCV: 116.9 fL — ABNORMAL HIGH (ref 80.0–100.0)
Platelets: 159 10*3/uL (ref 150–400)
RBC: 3.5 MIL/uL — ABNORMAL LOW (ref 3.87–5.11)
RDW: 16.9 % — ABNORMAL HIGH (ref 11.5–15.5)
WBC: 8.7 10*3/uL (ref 4.0–10.5)
nRBC: 0 % (ref 0.0–0.2)

## 2023-10-23 LAB — COMPREHENSIVE METABOLIC PANEL WITH GFR
ALT: 37 U/L (ref 0–44)
AST: 45 U/L — ABNORMAL HIGH (ref 15–41)
Albumin: 2 g/dL — ABNORMAL LOW (ref 3.5–5.0)
Alkaline Phosphatase: 162 U/L — ABNORMAL HIGH (ref 38–126)
Anion gap: 10 (ref 5–15)
BUN: 19 mg/dL (ref 8–23)
CO2: 23 mmol/L (ref 22–32)
Calcium: 8.1 mg/dL — ABNORMAL LOW (ref 8.9–10.3)
Chloride: 109 mmol/L (ref 98–111)
Creatinine, Ser: 0.53 mg/dL (ref 0.44–1.00)
GFR, Estimated: >60 mL/min (ref >60)
Glucose, Bld: 103 mg/dL — ABNORMAL HIGH (ref 70–99)
Potassium: 4.3 mmol/L (ref 3.5–5.1)
Sodium: 142 mmol/L (ref 135–145)
Total Bilirubin: 1.1 mg/dL (ref 0.0–1.2)
Total Protein: 5.1 g/dL — ABNORMAL LOW (ref 6.5–8.1)

## 2023-10-23 LAB — CK: Total CK: 74 U/L (ref 38–234)

## 2023-10-23 NOTE — ED Triage Notes (Signed)
 Pt is oriented to self. She says that she was cold and she was trying to get away from the air conditioner. She says she ended up on the floor by it, so her complaint right now is that she is cold. She has wounds on her feet all the time

## 2023-10-23 NOTE — ED Triage Notes (Signed)
 Pt arrives via [facility] from [facility]. Pt says that she lowered herself to the ground Per staff, they found her on the floor (prone, faced down on the ground). Unwitnessed fall. Pt has bilateral chronic leg wounds that she continue to pick at, she will not let the staff at the facility change her soiled clothes or wrap her dressings. No complaints from the fall, eval only. En route vitals 126/58, hr 130, 99% RA, cbg 133.

## 2023-10-23 NOTE — ED Provider Triage Note (Signed)
 Emergency Medicine Provider Triage Evaluation Note  Rose Bridges , a 63 y.o. female  was evaluated in triage.  Brought in by EMS following an unwitnessed fall.  She reports she did not fall to my knowledge.  Was found on the ground facedown, she complains of feeling cold as well as discomfort with the chronic wounds on her feet, left> right.  Review of Systems  Positive: As above Negative: As above  Physical Exam  BP (!) 123/94 (BP Location: Left Arm)   Pulse (!) 130   Temp 97.6 F (36.4 C) (Oral)   Resp 16   SpO2 99%  Gen:   Awake, no distress   Resp:  Normal effort  MSK:   Moves extremities without difficulty.  Chronic appearing to dorsal aspect of left foot. Other:  Patient is alert and oriented to self, unable to give me a correct date of birth, she is aware of what city she is in but cannot tell me the year.  No facial droop or asymmetry.  Medical Decision Making  Medically screening exam initiated at 7:36 PM.  Appropriate orders placed.  Rose Bridges was informed that the remainder of the evaluation will be completed by another provider, this initial triage assessment does not replace that evaluation, and the importance of remaining in the ED until their evaluation is complete.     Rose Bridges, [place] 10/23/23 1939

## 2023-10-24 ENCOUNTER — Emergency Department (HOSPITAL_COMMUNITY)

## 2023-10-24 NOTE — ED Notes (Signed)
 [name] called and setup transportation

## 2023-10-24 NOTE — ED Provider Notes (Signed)
 [facility] EMERGENCY DEPARTMENT AT [facility] Provider Note   CSN: [id number] Arrival date & time: 10/23/23  1900     Patient presents with: Rose Bridges Rose Bridges is a 63 y.o. female.   The history is provided by the patient, medical records and the nursing home.  Fall  Rose Bridges is a 63 y.o. female who presents to the Emergency Department complaining of fall.  She presents to the emergency department by EMS for evaluation of injuries following an unwitnessed fall.  She is at [facility] and rehab.  It was found on the ground.  Unknown how long she was down.  At baseline she is nonambulatory and has behavioral issues and gets confused frequently.  Staff report that she has not been sick recently.  Patient does complain of pain to her legs bilaterally.  Also discussed with daughter over the phone, no recent illnesses or new acute complaints.     Prior to Admission medications   Medication Sig Start Date End Date Taking? Authorizing Provider  acetaminophen  (TYLENOL ) 500 MG tablet Take 1,000 mg by mouth 3 (three) times daily.    [provider]  ARIPiprazole  (ABILIFY ) 5 MG tablet Take 5 mg by mouth daily. 09/14/23   [provider]  BIOFREEZE COOL THE PAIN 4 % GEL Apply 1 application  topically See admin instructions. Apply to both hands and knees 2 times a day    [provider]  bisacodyl  (DULCOLAX) 10 MG suppository Place 10 mg rectally daily as needed (for constipation).    [provider]  calcitonin, salmon, (MIACALCIN /FORTICAL) 200 UNIT/ACT nasal spray Place 1 spray into alternate nostrils daily. 04/08/22   Quillen, Michael, MD  calcium  carbonate (TUMS - DOSED IN MG ELEMENTAL CALCIUM ) 500 MG chewable tablet Chew 1 tablet by mouth 2 (two) times daily.    [provider]  celecoxib  (CELEBREX ) 50 MG capsule Take 50 mg by mouth daily. 05/06/23   [provider]  DEPAKOTE  ER 250 MG 24 hr  tablet Take 250 mg by mouth at bedtime.    [provider]  diclofenac Sodium (VOLTAREN) 1 % GEL Apply 2 g topically 2 (two) times daily.    [provider]  docusate sodium  (COLACE) 100 MG capsule Take 1 capsule (100 mg total) by mouth 2 (two) times daily. 09/20/23   [place], Camellia PARAS, DO  DULoxetine  (CYMBALTA ) 30 MG capsule Take 1 capsule (30 mg total) by mouth daily. 04/12/23   Cheryle Page, MD  FIBER PO Take 0.4 g by mouth 3 (three) times daily between meals.    [provider]  gabapentin  (NEURONTIN ) 300 MG capsule Take 1 capsule (300 mg total) by mouth 3 (three) times daily. 04/11/23   Cheryle Page, MD  levETIRAcetam  (KEPPRA ) 500 MG tablet Take 1 tablet (500 mg total) by mouth 2 (two) times daily. 05/18/22   Gherghe, Costin M, MD  LORazepam  (ATIVAN ) 0.5 MG tablet Take 1 tablet (0.5 mg total) by mouth every 6 (six) hours as needed for anxiety. 09/20/23   [place], Camellia PARAS, DO  magnesium  oxide (MAG-OX) 400 (240 Mg) MG tablet Take 400 mg by mouth daily.    [provider]  methocarbamol  (ROBAXIN ) 500 MG tablet Take 1 tablet (500 mg total) by mouth every 8 (eight) hours as needed for muscle spasms. 09/20/23   Danton Lauraine LABOR, PA-C  metoprolol  tartrate (LOPRESSOR ) 25 MG tablet Take 0.5 tablets (12.5 mg total) by mouth 2 (two) times daily. 09/20/23  [place], Camellia PARAS, DO  oxyCODONE  (OXY IR/ROXICODONE ) 5 MG immediate release tablet Take 0.5-1 tablets (2.5-5 mg total) by mouth every 6 (six) hours as needed for moderate pain (pain score 4-6) or severe pain (pain score 7-10). 09/20/23   Danton Lauraine LABOR, PA-C  pantoprazole  (PROTONIX ) 40 MG tablet Take 40 mg by mouth daily before breakfast.    [provider]  polyethylene glycol powder (GLYCOLAX /MIRALAX ) 17 GM/SCOOP powder Take 17 g by mouth daily as needed (to aid the bowels- mix into 4 to 6 ounces of fluid).    [provider]  senna (SENOKOT) 8.6 MG TABS tablet Take 1 tablet by mouth daily as needed for  mild constipation.    [provider]    Allergies: Codeine    Review of Systems  All other systems reviewed and are negative.   Updated Vital Signs BP 132/80 (BP Location: Left Arm)   Pulse (!) 111   Temp 97.7 F (36.5 C) (Oral)   Resp 16   SpO2 100%   Physical Exam Vitals and nursing note reviewed.  Constitutional:      Appearance: She is well-developed.  HENT:     Head: Normocephalic and atraumatic.   Cardiovascular:     Rate and Rhythm: Regular rhythm. Tachycardia present.  Pulmonary:     Effort: Pulmonary effort is normal. No respiratory distress.     Breath sounds: Normal breath sounds.  Abdominal:     Palpations: Abdomen is soft.     Tenderness: There is no abdominal tenderness. There is no guarding or rebound.   Musculoskeletal:     Comments: There is a chronic wound to the left distal foot.  There is a chronic wound to the left lateral knee.  There is mild tenderness over the right hip, left knee, right knee.  TThere is mild pain with range of motion to the left knee.  Pedal pulses are present bilaterally.   Skin:    General: Skin is warm and dry.   Neurological:     Mental Status: She is alert and oriented to person, place, and time.   Psychiatric:        Behavior: Behavior normal.     (all labs ordered are listed, but only abnormal results are displayed) Labs Reviewed  CBC - Abnormal; Notable for the following components:      Result Value   RBC 3.50 (*)    MCV 116.9 (*)    MCH 37.4 (*)    RDW 16.9 (*)    All other components within normal limits  COMPREHENSIVE METABOLIC PANEL WITH GFR - Abnormal; Notable for the following components:   Glucose, Bld 103 (*)    Calcium  8.1 (*)    Total Protein 5.1 (*)    Albumin 2.0 (*)    AST 45 (*)    Alkaline Phosphatase 162 (*)    All other components within normal limits  CK    EKG: None  Radiology: DG Hips Bilat W or Wo Pelvis 3-4 Views Result Date: 10/24/2023 EXAM: 5 VIEW(S) XRAY OF THE  BILATERAL HIPS 10/24/2023 04:08:03 AM COMPARISON: Bilateral hip radiographs 09/16/2023. CLINICAL HISTORY: Fall. Patient was found in floor at care home, states hips do not hurt but she is confused, abrasions to patella area of right knee, abrasions to patella area of left knee and laceration to lateral side of left knee. FINDINGS: BONES AND JOINTS: Moderate osteopenia is present. SI joints are fused. 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 No acute fracture  or focal osseous lesion. The hip joints are maintained. No significant degenerative changes. SOFT TISSUES: The soft tissues are unremarkable. IMPRESSION: 1. No acute fracture or dislocation. 2. 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 3. Moderate osteopenia. Electronically signed by: Lonni Necessary MD 10/24/2023 04:20 AM EDT RP Workstation: [id number]   DG Knee Complete 4 Views Right Result Date: 10/24/2023 EXAM: 4 VIEW(S) XRAY OF THE RIGHT KNEE 10/24/2023 04:08:03 AM COMPARISON: 09/16/2023 CLINICAL HISTORY: Fall. Patient was found on floor at care home, states hips do not hurt but she is confused, abrasions to patella area of right knee, abrasions to patella area of left knee and laceration to lateral side of left knee. FINDINGS: BONES AND JOINTS: No acute fracture. No focal osseous lesion. No joint dislocation. No significant joint effusion. Moderate osteopenia is again noted. Mild degenerative changes are stable. SOFT TISSUES: The soft tissues are unremarkable. IMPRESSION: 1. No acute fracture or dislocation. 2. Moderate osteopenia and mild degenerative changes, stable compared to prior study. Electronically signed by: Lonni Necessary MD 10/24/2023 04:18 AM EDT RP Workstation: [id number]   DG Knee Complete 4 Views Left Result Date: 10/24/2023 EXAM: 4 VIEW(S) XRAY OF THE LEFT KNEE 10/24/2023 04:08:03 AM COMPARISON: Left femur radiographs 09/18/2023. CLINICAL HISTORY: Fall. Patient was found in floor at care home, states hips do not hurt but she is  confused, abrasions to patella area of right knee, abrasions to patella area of left knee and laceration to lateral side of left knee. FINDINGS: BONES AND JOINTS: No acute fracture. Distal femur fracture previously seen demonstrates progressive healing. Lateral plate and screw fixation is stable. Diffuse osteopenia is again noted. SOFT TISSUES: Medial soft tissue swelling is present. IMPRESSION: 1. No acute fracture. 2. Progressive healing of the distal femur fracture s/p ORIF 3. Medial soft tissue swelling. Electronically signed by: Lonni Necessary MD 10/24/2023 04:15 AM EDT RP Workstation: [id number]   CT Head Wo Contrast Result Date: 10/23/2023 CLINICAL DATA:  Trauma. EXAM: CT HEAD WITHOUT CONTRAST CT CERVICAL SPINE WITHOUT CONTRAST TECHNIQUE: Multidetector CT imaging of the head and cervical spine was performed following the standard protocol without intravenous contrast. Multiplanar CT image reconstructions of the cervical spine were also generated. RADIATION DOSE REDUCTION: This exam was performed according to the departmental dose-optimization program which includes automated exposure control, adjustment of the mA and/or kV according to patient size and/or use of iterative reconstruction technique. COMPARISON:  09/16/2023. FINDINGS: CT HEAD FINDINGS Brain: Encephalomalacia consistent with chronic basal ganglia lacunar CVAs. There is periventricular white matter decreased attenuation consistent with small vessel ischemic changes. Ventricles, sulci and cisterns are prominent consistent with age related involutional changes. No acute intracranial hemorrhage, mass effect or shift. No hydrocephalus. Vascular: No hyperdense vessel or unexpected calcification. Skull: Normal. Negative for fracture or focal lesion. Sinuses/Orbits: No acute finding. CT CERVICAL SPINE FINDINGS Alignment: Normal. Skull base and vertebrae: No acute fracture. No primary bone lesion or focal pathologic process. Soft tissues and  spinal canal: No prevertebral fluid or swelling. No visible canal hematoma. Disc levels:  Disc space narrowing with marginal osteophytes C5-6-7. Upper chest: Negative. IMPRESSION: 1. Atrophy and chronic small vessel ischemic changes. 2. 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 3. No acute intracranial process identified. 4. Degenerative changes of the cervical spine. 5. No acute traumatic abnormalities. Electronically Signed   By: Fonda Field M.D.   On: 10/23/2023 22:24   CT Cervical Spine Wo Contrast Result Date: 10/23/2023 CLINICAL DATA:  Trauma. EXAM: CT HEAD WITHOUT CONTRAST CT CERVICAL SPINE WITHOUT CONTRAST TECHNIQUE:  Multidetector CT imaging of the head and cervical spine was performed following the standard protocol without intravenous contrast. Multiplanar CT image reconstructions of the cervical spine were also generated. RADIATION DOSE REDUCTION: This exam was performed according to the departmental dose-optimization program which includes automated exposure control, adjustment of the mA and/or kV according to patient size and/or use of iterative reconstruction technique. COMPARISON:  09/16/2023. FINDINGS: CT HEAD FINDINGS Brain: Encephalomalacia consistent with chronic basal ganglia lacunar CVAs. There is periventricular white matter decreased attenuation consistent with small vessel ischemic changes. Ventricles, sulci and cisterns are prominent consistent with age related involutional changes. No acute intracranial hemorrhage, mass effect or shift. No hydrocephalus. Vascular: No hyperdense vessel or unexpected calcification. Skull: Normal. Negative for fracture or focal lesion. Sinuses/Orbits: No acute finding. CT CERVICAL SPINE FINDINGS Alignment: Normal. Skull base and vertebrae: No acute fracture. No primary bone lesion or focal pathologic process. Soft tissues and spinal canal: No prevertebral fluid or swelling. No visible canal hematoma. Disc levels:  Disc space narrowing with marginal  osteophytes C5-6-7. Upper chest: Negative. IMPRESSION: 1. Atrophy and chronic small vessel ischemic changes. 2. 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 3. No acute intracranial process identified. 4. Degenerative changes of the cervical spine. 5. No acute traumatic abnormalities. Electronically Signed   By: Fonda Field M.D.   On: 10/23/2023 22:24     Procedures   Medications Ordered in the ED - No data to display                                  Medical Decision Making Amount and/or Complexity of Data Reviewed Radiology: ordered.   Patient with history of osteoporosis, encephalopathy currently on palliative care/hospice here for evaluation after being found on the ground.  Patient with complaints of chronic wounds to her legs.  She does not have acute pain complaints to her legs but does have some mild tenderness on palpation to the left knee, right hip.  Plain films are negative for acute fracture.  She is non ambulatory at baseline.  CT scans with chronic changes.  Labs are near her baseline.  Discussed with staff at nursing facility, family.  No concerns for acute underlying infection or change from baseline.  Feel she is stable for discharge back to her facility with outpatient follow-up and return precautions.     Final diagnoses:  Fall, initial encounter    ED Discharge Orders     None          Griselda Norris, MD 10/24/23 [phone]

## 2023-10-24 NOTE — ED Notes (Signed)
 [name] called about patient discharge- Rose Bridges

## 2023-10-24 NOTE — ED Notes (Signed)
 Patient transported to X-ray

## 2023-10-28 ENCOUNTER — Other Ambulatory Visit: Payer: Self-pay

## 2023-10-28 ENCOUNTER — Emergency Department (HOSPITAL_COMMUNITY)

## 2023-10-28 ENCOUNTER — Emergency Department (HOSPITAL_COMMUNITY)
Admission: EM | Admit: 2023-10-28 | Discharge: 2023-10-29 | Disposition: A | Discharge status: Home / Self Care | Source: Skilled Nursing Facility

## 2023-10-28 ENCOUNTER — Encounter (HOSPITAL_COMMUNITY): Payer: Self-pay | Admitting: Emergency Medicine

## 2023-10-28 DIAGNOSIS — S0990XA Unspecified injury of head, initial encounter: Secondary | ICD-10-CM | POA: Diagnosis present

## 2023-10-28 DIAGNOSIS — M25562 Pain in left knee: Secondary | ICD-10-CM | POA: Diagnosis not present

## 2023-10-28 DIAGNOSIS — Z79899 Other long term (current) drug therapy: Secondary | ICD-10-CM | POA: Insufficient documentation

## 2023-10-28 DIAGNOSIS — S0003XA Contusion of scalp, initial encounter: Secondary | ICD-10-CM | POA: Insufficient documentation

## 2023-10-28 DIAGNOSIS — R451 Restlessness and agitation: Secondary | ICD-10-CM | POA: Diagnosis not present

## 2023-10-28 DIAGNOSIS — Z4800 Encounter for change or removal of nonsurgical wound dressing: Secondary | ICD-10-CM | POA: Insufficient documentation

## 2023-10-28 DIAGNOSIS — R000 Tachycardia, unspecified: Secondary | ICD-10-CM | POA: Insufficient documentation

## 2023-10-28 DIAGNOSIS — I10 Essential (primary) hypertension: Secondary | ICD-10-CM | POA: Insufficient documentation

## 2023-10-28 DIAGNOSIS — W19XXXA Unspecified fall, initial encounter: Secondary | ICD-10-CM | POA: Insufficient documentation

## 2023-10-28 LAB — BASIC METABOLIC PANEL WITH GFR
Anion gap: 9 (ref 5–15)
BUN: 13 mg/dL (ref 8–23)
CO2: 27 mmol/L (ref 22–32)
Calcium: 8.7 mg/dL — ABNORMAL LOW (ref 8.9–10.3)
Chloride: 105 mmol/L (ref 98–111)
Creatinine, Ser: 0.43 mg/dL — ABNORMAL LOW (ref 0.44–1.00)
GFR, Estimated: >60 mL/min (ref >60)
Glucose, Bld: 125 mg/dL — ABNORMAL HIGH (ref 70–99)
Potassium: 4.7 mmol/L (ref 3.5–5.1)
Sodium: 141 mmol/L (ref 135–145)

## 2023-10-28 LAB — CBC
HCT: 38.2 % (ref 36.0–46.0)
Hemoglobin: 12.7 g/dL (ref 12.0–15.0)
MCH: 37.9 pg — ABNORMAL HIGH (ref 26.0–34.0)
MCHC: 33.2 g/dL (ref 30.0–36.0)
MCV: 114 fL — ABNORMAL HIGH (ref 80.0–100.0)
Platelets: 318 10*3/uL (ref 150–400)
RBC: 3.35 MIL/uL — ABNORMAL LOW (ref 3.87–5.11)
RDW: 16.1 % — ABNORMAL HIGH (ref 11.5–15.5)
WBC: 7.6 10*3/uL (ref 4.0–10.5)
nRBC: 0 % (ref 0.0–0.2)

## 2023-10-28 LAB — LACTIC ACID, PLASMA: Lactic Acid, Venous: 2.2 mmol/L (ref 0.5–1.9)

## 2023-10-28 MED ORDER — LORAZEPAM 2 MG/ML IJ SOLN
1.0000 mg | Freq: Once | INTRAMUSCULAR | Status: AC
  Administered 2023-10-28: 1 mg via INTRAVENOUS
  Filled 2023-10-28: qty 1

## 2023-10-28 MED ORDER — METOPROLOL TARTRATE 25 MG PO TABS
12.5000 mg | ORAL_TABLET | Freq: Once | ORAL | Status: AC
  Administered 2023-10-28: 12.5 mg via ORAL
  Filled 2023-10-28: qty 1

## 2023-10-28 MED ORDER — LACTATED RINGERS IV BOLUS
1000.0000 mL | Freq: Once | INTRAVENOUS | Status: AC
  Administered 2023-10-28: 1000 mL via INTRAVENOUS

## 2023-10-28 MED ORDER — IOHEXOL 350 MG/ML SOLN
75.0000 mL | Freq: Once | INTRAVENOUS | Status: AC | PRN
  Administered 2023-10-28: 75 mL via INTRAVENOUS

## 2023-10-28 MED ORDER — DIVALPROEX SODIUM 250 MG PO DR TAB
250.0000 mg | DELAYED_RELEASE_TABLET | Freq: Once | ORAL | Status: AC
  Administered 2023-10-28: 250 mg via ORAL
  Filled 2023-10-28: qty 1

## 2023-10-28 MED ORDER — LEVETIRACETAM 500 MG PO TABS
500.0000 mg | ORAL_TABLET | Freq: Once | ORAL | Status: AC
  Administered 2023-10-28: 500 mg via ORAL
  Filled 2023-10-28: qty 1

## 2023-10-28 NOTE — ED Provider Notes (Signed)
 [place] EMERGENCY DEPARTMENT AT [facility] Provider Note   CSN: [id number] Arrival date & time: 10/28/23  1615     Patient presents with: Fall and Wound Check   Rose Bridges is a 63 y.o. female.   63 year old female with past medical history of hypertension, hyperlipidemia, CVA, and epilepsy as well as chronic encephalopathy presenting to the emergency department today from [facility] after she fell.  The patient is only complaining of bruising to her head but denies any known head injury.  The patient is confused and hard to obtain history from at baseline mental status.  She is also complaining of pain in her left knee where she has been picking at a chronic wound.  This has been a chronic issue per EMS.  She is somewhat agitated when asking basic questions and per nursing staff this is at her baseline.   Fall  Wound Check       Prior to Admission medications   Medication Sig Start Date End Date Taking? Authorizing Provider  acetaminophen  (TYLENOL ) 500 MG tablet Take 1,000 mg by mouth 3 (three) times daily.    [provider]  ARIPiprazole  (ABILIFY ) 5 MG tablet Take 5 mg by mouth daily. 09/14/23   [provider]  BIOFREEZE COOL THE PAIN 4 % GEL Apply 1 application  topically See admin instructions. Apply to both hands and knees 2 times a day    [provider]  bisacodyl  (DULCOLAX) 10 MG suppository Place 10 mg rectally daily as needed (for constipation).    [provider]  calcitonin, salmon, (MIACALCIN /FORTICAL) 200 UNIT/ACT nasal spray Place 1 spray into alternate nostrils daily. 04/08/22   Quillen, Michael, MD  calcium  carbonate (TUMS - DOSED IN MG ELEMENTAL CALCIUM ) 500 MG chewable tablet Chew 1 tablet by mouth 2 (two) times daily.    [provider]  celecoxib  (CELEBREX ) 50 MG capsule Take 50 mg by mouth daily. 05/06/23   [provider]  DEPAKOTE  ER 250 MG 24 hr tablet Take 250 mg by mouth  at bedtime.    [provider]  diclofenac Sodium (VOLTAREN) 1 % GEL Apply 2 g topically 2 (two) times daily.    [provider]  docusate sodium  (COLACE) 100 MG capsule Take 1 capsule (100 mg total) by mouth 2 (two) times daily. 09/20/23   [place], Camellia PARAS, DO  DULoxetine  (CYMBALTA ) 30 MG capsule Take 1 capsule (30 mg total) by mouth daily. 04/12/23   Cheryle Page, MD  FIBER PO Take 0.4 g by mouth 3 (three) times daily between meals.    [provider]  gabapentin  (NEURONTIN ) 300 MG capsule Take 1 capsule (300 mg total) by mouth 3 (three) times daily. 04/11/23   Cheryle Page, MD  levETIRAcetam  (KEPPRA ) 500 MG tablet Take 1 tablet (500 mg total) by mouth 2 (two) times daily. 05/18/22   Gherghe, Costin M, MD  LORazepam  (ATIVAN ) 0.5 MG tablet Take 1 tablet (0.5 mg total) by mouth every 6 (six) hours as needed for anxiety. 09/20/23   [place], Camellia PARAS, DO  magnesium  oxide (MAG-OX) 400 (240 Mg) MG tablet Take 400 mg by mouth daily.    [provider]  methocarbamol  (ROBAXIN ) 500 MG tablet Take 1 tablet (500 mg total) by mouth every 8 (eight) hours as needed for muscle spasms. 09/20/23   Danton Lauraine LABOR, PA-C  metoprolol  tartrate (LOPRESSOR ) 25 MG tablet Take 0.5 tablets (12.5 mg total) by mouth 2 (two) times daily. 09/20/23  [place], Camellia PARAS, DO  oxyCODONE  (OXY IR/ROXICODONE ) 5 MG immediate release tablet Take 0.5-1 tablets (2.5-5 mg total) by mouth every 6 (six) hours as needed for moderate pain (pain score 4-6) or severe pain (pain score 7-10). 09/20/23   Danton Lauraine LABOR, PA-C  pantoprazole  (PROTONIX ) 40 MG tablet Take 40 mg by mouth daily before breakfast.    [provider]  polyethylene glycol powder (GLYCOLAX /MIRALAX ) 17 GM/SCOOP powder Take 17 g by mouth daily as needed (to aid the bowels- mix into 4 to 6 ounces of fluid).    [provider]  senna (SENOKOT) 8.6 MG TABS tablet Take 1 tablet by mouth daily as needed for mild constipation.     [provider]    Allergies: Codeine    Review of Systems  Reason unable to perform ROS: Baseline mental status.    Updated Vital Signs BP (!) 149/96   Pulse (!) 117   Temp 98.3 F (36.8 C) (Oral)   Resp 13   SpO2 99%   Physical Exam Vitals and nursing note reviewed.   Gen: NAD, chronically ill. Eyes: PERRL, EOMI HEENT: no oropharyngeal swelling Neck: trachea midline Resp: clear to auscultation bilaterally Card: RRR, no murmurs, rubs, or gallops Abd: nontender, nondistended Extremities: Chronic appearing wound noted over the left forefoot with no drainage noted, there is also a chronic appearing wound over the lateral aspect of the left knee with very minimal erythema no drainage noted, the patient is tender over the lateral aspect of the left knee but is able to bend somewhat without significant pain.  The remainder of the extremities are atraumatic Vascular: 2+ radial pulses bilaterally, 2+ DP pulses bilaterally Skin: no rashes Psyc: Agitated   (all labs ordered are listed, but only abnormal results are displayed) Labs Reviewed  BASIC METABOLIC PANEL WITH GFR - Abnormal; Notable for the following components:      Result Value   Glucose, Bld 125 (*)    Creatinine, Ser 0.43 (*)    Calcium  8.7 (*)    All other components within normal limits  CBC - Abnormal; Notable for the following components:   RBC 3.35 (*)    MCV 114.0 (*)    MCH 37.9 (*)    RDW 16.1 (*)    All other components within normal limits  LACTIC ACID, PLASMA - Abnormal; Notable for the following components:   Lactic Acid, Venous 2.2 (*)    All other components within normal limits  CULTURE, BLOOD (ROUTINE X 2)  CULTURE, BLOOD (ROUTINE X 2)  LACTIC ACID, PLASMA  URINALYSIS, ROUTINE W REFLEX MICROSCOPIC    EKG: EKG Interpretation Date/Time:  Monday October 28 2023 19:31:20 EDT Ventricular Rate:  116 PR Interval:  126 QRS Duration:  68 QT Interval:  351 QTC Calculation: 488 R  Axis:   80  Text Interpretation: Sinus tachycardia Premature atrial complexes Nonspecific ST abnormality Confirmed by Ula Barter [phone]) on 10/28/2023 7:39:57 PM  Radiology: CT Cervical Spine Wo Contrast Result Date: 10/28/2023 CLINICAL DATA:  Patient found on the floor. EXAM: CT CERVICAL SPINE WITHOUT CONTRAST TECHNIQUE: Multidetector CT imaging of the cervical spine was performed without intravenous contrast. Multiplanar CT image reconstructions were also generated. RADIATION DOSE REDUCTION: This exam was performed according to the departmental dose-optimization program which includes automated exposure control, adjustment of the mA and/or kV according to patient size and/or use of iterative reconstruction technique. COMPARISON:  October 23, 2023 FINDINGS: Alignment: Normal. Skull base and vertebrae: No acute fracture. No primary  bone lesion or focal pathologic process. Soft tissues and spinal canal: No prevertebral fluid or swelling. No visible canal hematoma. Disc levels: Mild to moderate severity endplate sclerosis and anterior osteophyte formation are seen at the levels of C4-C5, C5-C6 and C6-C7. Mild multilevel intervertebral disc space narrowing is seen, slightly more prominent at the level of C6-C7. Mild, bilateral multilevel facet joint hypertrophy is noted. Upper chest: Negative. Other: It should be noted that the study is limited secondary to patient positioning. IMPRESSION: 1. No acute fracture or subluxation in the cervical spine. 2. Mild to moderate severity multilevel degenerative changes, as described above. Electronically Signed   By: Suzen Dials M.D.   On: 10/28/2023 20:57   CT Head Wo Contrast Result Date: 10/28/2023 CLINICAL DATA:  Patient found on the floor. EXAM: CT HEAD WITHOUT CONTRAST TECHNIQUE: Contiguous axial images were obtained from the base of the skull through the vertex without intravenous contrast. RADIATION DOSE REDUCTION: This exam was performed according to the  departmental dose-optimization program which includes automated exposure control, adjustment of the mA and/or kV according to patient size and/or use of iterative reconstruction technique. COMPARISON:  October 23, 2023 FINDINGS: Brain: There is generalized cerebral atrophy with widening of the extra-axial spaces and ventricular dilatation. There are areas of decreased attenuation within the white matter tracts of the supratentorial brain, consistent with microvascular disease changes. Small, chronic bilateral basal ganglia lacunar infarcts are seen. Vascular: Moderate severity bilateral cavernous carotid artery calcification is noted. Skull: Normal. Negative for fracture or focal lesion. Sinuses/Orbits: A 2.2 cm inferior right maxillary sinus polyp versus mucous retention cyst is seen. Other: None. IMPRESSION: 1. Generalized cerebral atrophy with chronic white matter small vessel ischemic changes. 2. Small, chronic bilateral basal ganglia lacunar infarcts. 3. No acute intracranial abnormality. Electronically Signed   By: Suzen Dials M.D.   On: 10/28/2023 20:54   DG Chest Port 1 View Result Date: 10/28/2023 CLINICAL DATA:  Patient found prone on the floor. EXAM: PORTABLE CHEST 1 VIEW COMPARISON:  Sep 16, 2023 FINDINGS: The heart size and mediastinal contours are within normal limits. Both lungs are clear. Coarse, bilateral breast soft tissue calcifications are seen. There is a chronic fracture deformity of the distal right clavicle. A radiopaque fixation plate and screws are seen along the proximal right humerus. IMPRESSION: Stable exam without evidence of active cardiopulmonary disease. Electronically Signed   By: Suzen Dials M.D.   On: 10/28/2023 17:32   DG Knee 2 Views Left Result Date: 10/28/2023 CLINICAL DATA:  Patient found prone on the floor. EXAM: LEFT KNEE - 1-2 VIEW COMPARISON:  October 24, 2023 FINDINGS: A large radiopaque fixation plate and screws are seen along the lateral aspect of the left  femur. A distal left femoral fracture is again seen and is unchanged in appearance. There is no evidence of dislocation. Diffuse osteopenia is noted. Mild to moderate severity medial soft tissue swelling is seen. This is mildly increased in severity when compared to the prior study. IMPRESSION: 1. Status post ORIF of a distal left femoral fracture. 2. Mild to moderate severity medial soft tissue swelling. Electronically Signed   By: Suzen Dials M.D.   On: 10/28/2023 17:30     Procedures   Medications Ordered in the ED  lactated ringers  bolus 1,000 mL (0 mLs Intravenous Stopped 10/28/23 1940)  lactated ringers  bolus 1,000 mL (0 mLs Intravenous Stopped 10/28/23 2222)  LORazepam  (ATIVAN ) injection 1 mg (1 mg Intravenous Given 10/28/23 1938)  levETIRAcetam  (KEPPRA ) tablet 500 mg (  500 mg Oral Given 10/28/23 2222)  divalproex  (DEPAKOTE ) DR tablet 250 mg (250 mg Oral Given 10/28/23 2222)  metoprolol  tartrate (LOPRESSOR ) tablet 12.5 mg (12.5 mg Oral Given 10/28/23 2245)  lactated ringers  bolus 1,000 mL (1,000 mLs Intravenous New Bag/Given 10/28/23 2222)    Clinical Course as of 10/28/23 2323  Mon Oct 28, 2023  2257 Stable HO ART 51 YOF from SNF with tachycardia, multiple falls. AOC sinus tach. 140s over her baseline to 110s Got home Metoprolol . Getting IVF. Getting PE rule out.   [CC]  2259 DNR/DNI but limited scope for medical interventions [CC]    Clinical Course User Index [CC] Jerral Meth, MD                                 Medical Decision Making 63 year old female with past medical history of hypertension, hyperlipidemia, CVA with encephalopathy at baseline presenting to the emergency department today after a fall at her facility.  Will further evaluate the patient here with a CT scan of her head and neck as well as an x-ray of her left knee for further evaluation for acute traumatic injuries.  The patient is tachycardic here.  Looking back through her chart it does appear that this  has been a chronic issue.  Give her IV fluids and obtain basic labs to evaluate for anemia or electrolyte abnormalities.  I will reevaluate for ultimate disposition.  The patient's labs are reassuring.  Her heart rate remained elevated.  On multiple EKG she was in a sinus tachycardia.  Given her recent surgery CT angiogram is ordered.  Lactate is also ordered.  Plan is for reevaluation after fluids for ultimate disposition.  Amount and/or Complexity of Data Reviewed Labs: ordered. Radiology: ordered.  Risk Prescription drug management.        Final diagnoses:  Tachycardia  Closed head injury, initial encounter    ED Discharge Orders     None          Ula Prentice SAUNDERS, MD 10/28/23 2323

## 2023-10-28 NOTE — ED Triage Notes (Signed)
 Patient is from [facility].  Patient was found prone on the floor with her legs on the bed. Patient is currently at baseline. She is agitated and combative. Open wound on left knees from fracture repair. Patient picks at the wound on the left knee, so it does not heal.   EMS vitals: 128/70 BP 148 HR 132 CBG 93% SPO2 on room air

## 2023-10-28 NOTE — ED Notes (Signed)
 Patient HR remains in high 130's provider Ula Barter notified.

## 2023-10-28 NOTE — ED Notes (Signed)
 Patient resting in bed breathing eyes closed

## 2023-10-28 NOTE — ED Notes (Signed)
 Patient medicated per [facility] and is ready for CT.Called CT to notify them of patient being ready but no answer at this time.

## 2023-10-28 NOTE — ED Notes (Signed)
 Attempted another IV site x 2 unsuccessful. Will call for IV team at this time.

## 2023-10-28 NOTE — ED Provider Notes (Signed)
 Care of patient received from prior provider at 10:59 PM, please see their note for complete H/P and care plan.  Received handoff per ED course.  Clinical Course as of 10/28/23 2259  Mon Oct 28, 2023  2257 Stable HO ART 51 YOF from SNF with tachycardia, multiple falls. AOC sinus tach. 140s over her baseline to 110s Got home Metoprolol . Getting IVF. Getting PE rule out.   [CC]  2259 DNR/DNI but limited scope for medical interventions [CC]    Clinical Course User Index [CC] Jerral Meth, MD    Reassessment: On reassessment, patient is sleeping comfortably. Lactic acid has returned to normal, heart rate down to normal after IV fluid and metoprolol  administration. Likely dehydration causing patient's constellation of symptoms.  This has been corrected at this time.  She will need to follow-up with her primary care provider for ongoing care and management.  She lives at a skilled nursing facility and can get ongoing outpatient care at this time.  Reinforced fluid intake.     Jerral Meth, MD 10/29/23 [phone]

## 2023-10-29 LAB — LACTIC ACID, PLASMA: Lactic Acid, Venous: 1 mmol/L (ref 0.5–1.9)

## 2023-10-29 NOTE — Discharge Instructions (Signed)
 On reassessment, patient is sleeping comfortably. Lactic acid has returned to normal, heart rate down to normal after IV fluid and metoprolol  administration. Likely dehydration causing patient's constellation of symptoms.  This has been corrected at this time.  She will need to follow-up with her primary care provider for ongoing care and management.  She lives at a skilled nursing facility and can get ongoing outpatient care at this time.  Reinforced fluid intake.

## 2023-11-03 LAB — CULTURE, BLOOD (ROUTINE X 2): Culture: NO GROWTH

## 2023-11-04 ENCOUNTER — Ambulatory Visit: Payer: Self-pay | Admitting: Student

## 2023-11-05 ENCOUNTER — Encounter (HOSPITAL_COMMUNITY): Payer: Self-pay | Admitting: Physician Assistant

## 2023-11-05 NOTE — Progress Notes (Signed)
 Spoke with nurse,Nyesha at [facility] who informed me that Ms. Coco is not going to have surgery tomorrow. Nyesha stated that Ms. Limburg's daughter,Emily and other family members and the hospice are at [facility] and the decision has been made that Ms. Urieta will not have surgery tomorrow. Notified Dr. Kendal and Lauraine Danton RIGGERS via secure chat.

## 2023-11-05 NOTE — H&P (Incomplete)
 [facility] (OTS) H&P  Patient ID: Rose Bridges MRN: [id number] DOB/AGE: 64/23/1962 64 y.o.  Reason for surgery: Left femur postoperative wound infection  HPI: Rose Bridges is a 63 y.o. female with PMH significant for bipolar disorder and seizure disorder presenting for surgery on left lower extremity.  Patient sustained a ground-level fall in May 2025, resulting in left distal femur fracture.  She underwent ORIF of the left distal femur on 09/18/2023.  Since being discharged from the hospital to [facility] and rehab, has unfortunately had multiple falls.  Most recent fall 10/24/2023 and again on 10/28/2023.  Was seen in the emergency department on 10/28/2023 and noted to have dehiscence of her surgical wound to the left distal thigh.  Per chart review, patient has been picking at her wounds.  The wound has unfortunately developed purulent drainage, concerning for infection.  She was started on doxycycline on 10/30/2023.  Was seen in OTS clinic on 11/04/2023 and unfortunately still having drainage from this area.  She presents now for formal irrigation debridement of the wound. Prior to initial injury, patient notes that she was supposed to be using a rolling walker/cane but was not always doing so.  Patient not currently on any anticoagulation.  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[facility])    Metabolic encephalopathy    Osteogenesis imperfecta    Osteogenesis imperfecta    Osteoporosis    Personality disorder (HCC)    PVD (peripheral vascular disease) (HCC)    Stroke [facility])    Past Surgical History:  Procedure  Laterality Date   ABDOMINAL HYSTERECTOMY     BIOPSY  07/05/2022   Procedure: BIOPSY;  Surgeon: Wilhelmenia Aloha Raddle., MD;  Location: [facility];  Service: Gastroenterology;;   ESOPHAGOGASTRODUODENOSCOPY N/A 07/05/2022   Procedure: ESOPHAGOGASTRODUODENOSCOPY (EGD);  Surgeon: Wilhelmenia Aloha Raddle., MD;  Location: [facility];  Service: Gastroenterology;  Laterality: N/A;   EUS N/A 07/05/2022   Procedure: UPPER ENDOSCOPIC ULTRASOUND (EUS) LINEAR;  Surgeon: Wilhelmenia Aloha Raddle., MD;  Location: [facility];  Service: Gastroenterology;  Laterality: N/A;   HIP ARTHROPLASTY Left 09/26/2015   Procedure: ARTHROPLASTY BIPOLAR HIP (HEMIARTHROPLASTY);  Surgeon: Evalene JONETTA Chancy, MD;  Location: [facility] OR;  Service: Orthopedics;  Laterality: Left;   ORIF FEMUR FRACTURE Left 09/18/2023   Procedure: OPEN REDUCTION INTERNAL FIXATION (ORIF) DISTAL FEMUR FRACTURE;  Surgeon: Kendal Franky SQUIBB, MD;  Location: [facility] OR;  Service: Orthopedics;  Laterality: Left;   ORIF HUMERUS FRACTURE Right 11/06/2016   Procedure: OPEN REDUCTION INTERNAL FIXATION (ORIF) PROXIMAL HUMERUS FRACTURE;  Surgeon: Chancy Evalene JONETTA, MD;  Location: [facility] OR;  Service: Orthopedics;  Laterality: Right;   Family History  Problem Relation Age of Onset   Stroke Mother    Heart attack Mother    Hypertension Mother    Stroke Sister    Stroke Brother    Lung cancer Father     Social History:  reports that she quit smoking about 5 years ago. Her smoking use included cigarettes. She has never used smokeless tobacco. She reports that she does not drink alcohol and does not use drugs.  Allergies:  Allergies  Allergen Reactions   Codeine Other (See Comments)    Migraines     Medications: Prior to Admission medications   Medication Sig Start Date End Date Taking? Authorizing Provider  acetaminophen  (TYLENOL ) 500 MG tablet Take 1,000 mg by mouth 3 (three) times daily.    [provider]  ARIPiprazole  (ABILIFY ) 5 MG tablet Take 5 mg by mouth  daily. 09/14/23   [provider]  BIOFREEZE COOL THE PAIN 4 % GEL Apply 1 application  topically See admin instructions. Apply to both hands and knees 2 times a day    [provider]  bisacodyl  (DULCOLAX) 10 MG suppository Place 10 mg rectally daily as needed (for constipation).    [provider]  calcitonin, salmon, (MIACALCIN /FORTICAL) 200 UNIT/ACT nasal spray Place 1 spray into alternate nostrils daily. 04/08/22   Quillen, Michael, MD  calcium  carbonate (TUMS - DOSED IN MG ELEMENTAL CALCIUM ) 500 MG chewable tablet Chew 1 tablet by mouth 2 (two) times daily.    [provider]  celecoxib  (CELEBREX ) 50 MG capsule Take 50 mg by mouth daily. 05/06/23   [provider]  DEPAKOTE  ER 250 MG 24 hr tablet Take 250 mg by mouth at bedtime.    [provider]  diclofenac Sodium (VOLTAREN) 1 % GEL Apply 2 g topically 2 (two) times daily.    [provider]  docusate sodium  (COLACE) 100 MG capsule Take 1 capsule (100 mg total) by mouth 2 (two) times daily. 09/20/23   [place], Camellia PARAS, DO  DULoxetine  (CYMBALTA ) 30 MG capsule Take 1 capsule (30 mg total) by mouth daily. 04/12/23   Cheryle Page, MD  FIBER PO Take 0.4 g by mouth 3 (three) times daily between meals.    [provider]  gabapentin  (NEURONTIN ) 300 MG capsule Take 1 capsule (300 mg total) by mouth 3 (three) times daily. 04/11/23   Cheryle Page, MD  levETIRAcetam  (KEPPRA ) 500 MG tablet Take 1 tablet (500 mg total) by mouth 2 (two) times daily. 05/18/22   Gherghe, Costin M, MD  LORazepam  (ATIVAN ) 0.5 MG tablet Take 1 tablet (0.5 mg total) by mouth every 6 (six) hours as needed for anxiety. 09/20/23   [place], Camellia PARAS, DO  magnesium  oxide (MAG-OX) 400 (240 Mg) MG tablet Take 400 mg by mouth daily.    [provider]  methocarbamol  (ROBAXIN ) 500 MG tablet Take 1 tablet (500 mg total) by mouth every 8 (eight) hours as needed for muscle spasms. 09/20/23   Danton Lauraine LABOR, PA-C   metoprolol  tartrate (LOPRESSOR ) 25 MG tablet Take 0.5 tablets (12.5 mg total) by mouth 2 (two) times daily. 09/20/23   [place], Camellia PARAS, DO  oxyCODONE  (OXY IR/ROXICODONE ) 5 MG immediate release tablet Take 0.5-1 tablets (2.5-5 mg total) by mouth every 6 (six) hours as needed for moderate pain (pain score 4-6) or severe pain (pain score 7-10). 09/20/23   Danton Lauraine LABOR, PA-C  pantoprazole  (PROTONIX ) 40 MG tablet Take 40 mg by mouth daily before breakfast.    [provider]  polyethylene glycol powder (GLYCOLAX /MIRALAX ) 17 GM/SCOOP powder Take 17 g by mouth daily as needed (to aid the bowels- mix into 4 to 6 ounces of fluid).    [provider]  senna (SENOKOT) 8.6 MG TABS tablet Take 1 tablet by mouth daily as needed for mild constipation.    [provider]   I have reviewed the patient's current medications. Prior to Admission:  No medications prior to admission.  Positive ROS: All other systems have been reviewed and were otherwise negative with the exception of those mentioned in the HPI and as above.  Exam: There were no vitals taken for this visit. General: Alert and oriented, no acute distress Cardiovascular: No pedal edema Respiratory: No cyanosis, no use of accessory musculature GI: No organomegaly, abdomen is soft and non-tender Skin: No lesions in the area of chief complaint Neurologic: Sensation intact distally Psychiatric: Patient is competent for consent with normal mood and affect  Musculoskeletal: Left lower extremity: Incision to the left distal thigh with some dehiscence and associated purulent drainage.  Surrounding redness to the area.  Tenderness with palpation along the distal thigh.  No calf tenderness.  Nontender over the hip.  Ankle held in plantarflexion due to equinus contracture.  Tolerates gentle motion of the toes, although limited. + DP pulse  Right lower extremity: Skin without lesions. No tenderness to palpation. Full painless  ROM, full strength in each muscle group without evidence of instability. Motor/sensory function at baseline. Neurovascularly intact.   Medical Decision Making: Data: Imaging: AP and lateral views of the left femur show plate and screws in excellent position.  No signs any hardware failure or loosening.  Fracture remains in appropriate alignment.  Labs: No results found for this or any previous visit (from the past week).   Medical history and chart was reviewed and case discussed with attending provider.  Assessment/Plan: 63 year old female status post ORIF of left distal femur fracture on 09/18/2023, presenting with wound dehiscence and drainage  Patient has been on oral antibiotics for approximately 5 days but unfortunately continues to have purulent drainage from her incision, concerning for infection.  I do not feel that oral antibiotics alone will resolve this problem.  I recommend proceeding with irrigation debridement of the left distal thigh wound.  Risks and benefits of the procedure been discussed with the patient as well as with her daughter Cassara Nida) via telephone.  Patient's daughter agrees to proceed with surgery.  Verbal consent obtained.  We will plan to discharge the patient back to her rehab facility postoperatively.  She will allow her to continue weightbearing as tolerated on the left lower extremity.    Lauraine PATRIC Moores PA-C [facility] [phone] (office) [email or web address]

## 2023-11-06 ENCOUNTER — Encounter (HOSPITAL_COMMUNITY): Admission: RE | Payer: Self-pay | Source: Home / Self Care

## 2023-11-06 ENCOUNTER — Ambulatory Visit (HOSPITAL_COMMUNITY): Admission: RE | Admit: 2023-11-06 | Source: Home / Self Care | Admitting: Student

## 2023-11-06 SURGERY — IRRIGATION AND DEBRIDEMENT KNEE
Anesthesia: General | Site: Knee | Laterality: Left

## 2023-11-25 ENCOUNTER — Ambulatory Visit: Admitting: Neurology

## 2023-11-29 DEATH — deceased
